# Patient Record
Sex: Male | Born: 1945 | Race: White | Hispanic: No | Marital: Single | Smoking: Former smoker
Health system: Southern US, Community
[De-identification: ages and names within clinical notes are randomized; demographics above are authoritative.]

## PROBLEM LIST (undated history)

## (undated) DIAGNOSIS — Z720 Tobacco use: Secondary | ICD-10-CM

## (undated) DIAGNOSIS — I509 Heart failure, unspecified: Secondary | ICD-10-CM

## (undated) DIAGNOSIS — M545 Low back pain, unspecified: Secondary | ICD-10-CM

## (undated) DIAGNOSIS — I472 Ventricular tachycardia, unspecified: Secondary | ICD-10-CM

## (undated) DIAGNOSIS — C801 Malignant (primary) neoplasm, unspecified: Secondary | ICD-10-CM

## (undated) DIAGNOSIS — I4892 Unspecified atrial flutter: Secondary | ICD-10-CM

## (undated) DIAGNOSIS — M069 Rheumatoid arthritis, unspecified: Secondary | ICD-10-CM

## (undated) DIAGNOSIS — R942 Abnormal results of pulmonary function studies: Secondary | ICD-10-CM

## (undated) DIAGNOSIS — G8929 Other chronic pain: Secondary | ICD-10-CM

## (undated) DIAGNOSIS — M5412 Radiculopathy, cervical region: Secondary | ICD-10-CM

## (undated) DIAGNOSIS — E785 Hyperlipidemia, unspecified: Secondary | ICD-10-CM

## (undated) DIAGNOSIS — I739 Peripheral vascular disease, unspecified: Secondary | ICD-10-CM

## (undated) DIAGNOSIS — N189 Chronic kidney disease, unspecified: Secondary | ICD-10-CM

## (undated) DIAGNOSIS — G43909 Migraine, unspecified, not intractable, without status migrainosus: Secondary | ICD-10-CM

## (undated) DIAGNOSIS — F419 Anxiety disorder, unspecified: Secondary | ICD-10-CM

## (undated) DIAGNOSIS — I10 Essential (primary) hypertension: Secondary | ICD-10-CM

## (undated) DIAGNOSIS — I219 Acute myocardial infarction, unspecified: Secondary | ICD-10-CM

## (undated) DIAGNOSIS — Z95810 Presence of automatic (implantable) cardiac defibrillator: Secondary | ICD-10-CM

## (undated) DIAGNOSIS — I255 Ischemic cardiomyopathy: Secondary | ICD-10-CM

## (undated) DIAGNOSIS — I2510 Atherosclerotic heart disease of native coronary artery without angina pectoris: Secondary | ICD-10-CM

## (undated) HISTORY — DX: Atherosclerotic heart disease of native coronary artery without angina pectoris: I25.10

## (undated) HISTORY — DX: Abnormal results of pulmonary function studies: R94.2

## (undated) HISTORY — DX: Ischemic cardiomyopathy: I25.5

## (undated) HISTORY — DX: Unspecified atrial flutter: I48.92

## (undated) HISTORY — DX: Malignant (primary) neoplasm, unspecified: C80.1

## (undated) HISTORY — PX: CATARACT EXTRACTION W/ INTRAOCULAR LENS  IMPLANT, BILATERAL: SHX1307

## (undated) HISTORY — DX: Tobacco use: Z72.0

## (undated) HISTORY — DX: Essential (primary) hypertension: I10

## (undated) HISTORY — DX: Peripheral vascular disease, unspecified: I73.9

## (undated) HISTORY — PX: TONSILLECTOMY: SUR1361

## (undated) HISTORY — DX: Hyperlipidemia, unspecified: E78.5

## (undated) HISTORY — DX: Anxiety disorder, unspecified: F41.9

## (undated) HISTORY — DX: Ventricular tachycardia: I47.2

## (undated) HISTORY — DX: Acute myocardial infarction, unspecified: I21.9

## (undated) HISTORY — DX: Heart failure, unspecified: I50.9

## (undated) HISTORY — DX: Ventricular tachycardia, unspecified: I47.20

## (undated) HISTORY — PX: VASECTOMY: SHX75

## (undated) HISTORY — DX: Chronic kidney disease, unspecified: N18.9

## (undated) HISTORY — DX: Radiculopathy, cervical region: M54.12

---

## 1991-05-07 DIAGNOSIS — I219 Acute myocardial infarction, unspecified: Secondary | ICD-10-CM

## 1991-05-07 HISTORY — DX: Acute myocardial infarction, unspecified: I21.9

## 2007-07-05 HISTORY — PX: FEMORAL ARTERY - FEMORAL ARTERY BYPASS GRAFT: SUR179

## 2007-12-08 ENCOUNTER — Ambulatory Visit: Payer: Self-pay | Admitting: Vascular Surgery

## 2007-12-10 ENCOUNTER — Encounter: Admission: RE | Admit: 2007-12-10 | Discharge: 2007-12-10 | Payer: Self-pay | Admitting: Vascular Surgery

## 2007-12-22 ENCOUNTER — Ambulatory Visit: Payer: Self-pay | Admitting: Vascular Surgery

## 2007-12-30 ENCOUNTER — Inpatient Hospital Stay (HOSPITAL_COMMUNITY): Admission: RE | Admit: 2007-12-30 | Discharge: 2008-01-01 | Payer: Self-pay | Admitting: Vascular Surgery

## 2007-12-30 ENCOUNTER — Ambulatory Visit: Payer: Self-pay | Admitting: Vascular Surgery

## 2007-12-30 ENCOUNTER — Encounter: Payer: Self-pay | Admitting: Vascular Surgery

## 2008-01-19 ENCOUNTER — Ambulatory Visit: Payer: Self-pay | Admitting: Vascular Surgery

## 2008-05-06 DIAGNOSIS — Z95810 Presence of automatic (implantable) cardiac defibrillator: Secondary | ICD-10-CM

## 2008-05-06 HISTORY — DX: Presence of automatic (implantable) cardiac defibrillator: Z95.810

## 2008-06-21 ENCOUNTER — Ambulatory Visit: Payer: Self-pay | Admitting: Vascular Surgery

## 2008-09-03 HISTORY — PX: VENTRICULAR ABLATION SURGERY: SHX835

## 2008-09-20 ENCOUNTER — Inpatient Hospital Stay (HOSPITAL_COMMUNITY): Admission: AD | Admit: 2008-09-20 | Discharge: 2008-09-23 | Payer: Self-pay | Admitting: Cardiology

## 2008-09-20 ENCOUNTER — Encounter: Payer: Self-pay | Admitting: Cardiology

## 2008-09-20 ENCOUNTER — Ambulatory Visit: Payer: Self-pay | Admitting: Cardiovascular Disease

## 2008-09-22 HISTORY — PX: EP IMPLANTABLE DEVICE: SHX172B

## 2008-09-22 HISTORY — PX: CARDIAC DEFIBRILLATOR PLACEMENT: SHX171

## 2008-09-23 ENCOUNTER — Encounter: Payer: Self-pay | Admitting: Internal Medicine

## 2008-09-28 ENCOUNTER — Encounter: Payer: Self-pay | Admitting: Internal Medicine

## 2008-10-08 DIAGNOSIS — I10 Essential (primary) hypertension: Secondary | ICD-10-CM | POA: Insufficient documentation

## 2008-10-08 DIAGNOSIS — I739 Peripheral vascular disease, unspecified: Secondary | ICD-10-CM | POA: Insufficient documentation

## 2008-10-08 DIAGNOSIS — E785 Hyperlipidemia, unspecified: Secondary | ICD-10-CM | POA: Insufficient documentation

## 2008-10-11 ENCOUNTER — Ambulatory Visit: Payer: Self-pay | Admitting: Cardiology

## 2008-10-11 DIAGNOSIS — I472 Ventricular tachycardia, unspecified: Secondary | ICD-10-CM | POA: Insufficient documentation

## 2008-10-11 DIAGNOSIS — I2510 Atherosclerotic heart disease of native coronary artery without angina pectoris: Secondary | ICD-10-CM | POA: Insufficient documentation

## 2008-10-11 DIAGNOSIS — I255 Ischemic cardiomyopathy: Secondary | ICD-10-CM | POA: Insufficient documentation

## 2008-10-12 ENCOUNTER — Ambulatory Visit: Payer: Self-pay | Admitting: Internal Medicine

## 2008-10-12 ENCOUNTER — Telehealth: Payer: Self-pay | Admitting: Internal Medicine

## 2008-10-12 ENCOUNTER — Ambulatory Visit: Payer: Self-pay | Admitting: Cardiology

## 2008-10-12 ENCOUNTER — Emergency Department (HOSPITAL_COMMUNITY): Admission: EM | Admit: 2008-10-12 | Discharge: 2008-10-12 | Payer: Self-pay | Admitting: Emergency Medicine

## 2008-10-13 ENCOUNTER — Inpatient Hospital Stay (HOSPITAL_COMMUNITY): Admission: EM | Admit: 2008-10-13 | Discharge: 2008-10-18 | Payer: Self-pay | Admitting: Cardiology

## 2008-10-19 ENCOUNTER — Telehealth: Payer: Self-pay | Admitting: Cardiology

## 2008-11-01 ENCOUNTER — Telehealth: Payer: Self-pay | Admitting: Cardiology

## 2008-11-03 ENCOUNTER — Ambulatory Visit: Payer: Self-pay | Admitting: Internal Medicine

## 2008-11-04 ENCOUNTER — Telehealth (INDEPENDENT_AMBULATORY_CARE_PROVIDER_SITE_OTHER): Payer: Self-pay | Admitting: *Deleted

## 2008-12-30 ENCOUNTER — Ambulatory Visit: Payer: Self-pay | Admitting: Internal Medicine

## 2008-12-30 ENCOUNTER — Encounter: Payer: Self-pay | Admitting: Cardiology

## 2009-01-02 ENCOUNTER — Encounter: Payer: Self-pay | Admitting: Internal Medicine

## 2009-01-10 ENCOUNTER — Telehealth: Payer: Self-pay | Admitting: Cardiology

## 2009-01-10 ENCOUNTER — Ambulatory Visit: Payer: Self-pay | Admitting: Cardiology

## 2009-01-10 DIAGNOSIS — R42 Dizziness and giddiness: Secondary | ICD-10-CM | POA: Insufficient documentation

## 2009-01-24 ENCOUNTER — Telehealth: Payer: Self-pay | Admitting: Cardiology

## 2009-02-13 ENCOUNTER — Encounter: Payer: Self-pay | Admitting: Cardiology

## 2009-02-15 ENCOUNTER — Encounter: Payer: Self-pay | Admitting: Cardiology

## 2009-03-01 ENCOUNTER — Telehealth: Payer: Self-pay | Admitting: Cardiology

## 2009-03-10 ENCOUNTER — Ambulatory Visit: Payer: Self-pay | Admitting: Internal Medicine

## 2009-03-10 ENCOUNTER — Ambulatory Visit: Payer: Self-pay | Admitting: Cardiology

## 2009-03-10 DIAGNOSIS — E875 Hyperkalemia: Secondary | ICD-10-CM | POA: Insufficient documentation

## 2009-03-13 LAB — CONVERTED CEMR LAB
ALT: 23 units/L (ref 0–53)
AST: 23 units/L (ref 0–37)
Alkaline Phosphatase: 75 units/L (ref 39–117)
Bilirubin, Direct: 0 mg/dL (ref 0.0–0.3)
CO2: 31 meq/L (ref 19–32)
Calcium: 8.4 mg/dL (ref 8.4–10.5)
Creatinine, Ser: 1.4 mg/dL (ref 0.4–1.5)
GFR calc non Af Amer: 54.19 mL/min (ref >60)
Glucose, Bld: 106 mg/dL — ABNORMAL HIGH (ref 70–99)
HDL: 31.1 mg/dL — ABNORMAL LOW (ref >39.00)
Sodium: 146 meq/L — ABNORMAL HIGH (ref 135–145)
TSH: 2.61 microintl units/mL (ref 0.35–5.50)
Total Bilirubin: 0.5 mg/dL (ref 0.3–1.2)
Total CHOL/HDL Ratio: 4

## 2009-03-14 ENCOUNTER — Telehealth: Payer: Self-pay | Admitting: Cardiology

## 2009-04-17 ENCOUNTER — Ambulatory Visit: Payer: Self-pay | Admitting: Vascular Surgery

## 2009-04-20 ENCOUNTER — Ambulatory Visit: Payer: Self-pay | Admitting: Vascular Surgery

## 2009-05-17 ENCOUNTER — Telehealth: Payer: Self-pay | Admitting: Cardiology

## 2009-05-23 ENCOUNTER — Encounter: Payer: Self-pay | Admitting: Cardiology

## 2009-06-01 ENCOUNTER — Telehealth: Payer: Self-pay | Admitting: Internal Medicine

## 2009-06-14 ENCOUNTER — Encounter: Payer: Self-pay | Admitting: Cardiology

## 2009-06-28 ENCOUNTER — Ambulatory Visit: Payer: Self-pay | Admitting: Cardiology

## 2009-06-28 ENCOUNTER — Encounter: Payer: Self-pay | Admitting: Internal Medicine

## 2009-06-28 ENCOUNTER — Ambulatory Visit: Payer: Self-pay

## 2009-07-07 ENCOUNTER — Telehealth: Payer: Self-pay | Admitting: Cardiology

## 2009-07-12 ENCOUNTER — Encounter: Payer: Self-pay | Admitting: Cardiology

## 2009-07-26 ENCOUNTER — Telehealth: Payer: Self-pay | Admitting: Cardiology

## 2009-08-03 ENCOUNTER — Ambulatory Visit: Payer: Self-pay | Admitting: Vascular Surgery

## 2009-08-29 ENCOUNTER — Telehealth (INDEPENDENT_AMBULATORY_CARE_PROVIDER_SITE_OTHER): Payer: Self-pay | Admitting: *Deleted

## 2009-09-26 ENCOUNTER — Ambulatory Visit: Payer: Self-pay | Admitting: Internal Medicine

## 2009-09-26 ENCOUNTER — Telehealth: Payer: Self-pay | Admitting: Internal Medicine

## 2009-10-10 ENCOUNTER — Encounter: Payer: Self-pay | Admitting: Internal Medicine

## 2009-10-25 ENCOUNTER — Telehealth (INDEPENDENT_AMBULATORY_CARE_PROVIDER_SITE_OTHER): Payer: Self-pay | Admitting: *Deleted

## 2009-11-03 DIAGNOSIS — R942 Abnormal results of pulmonary function studies: Secondary | ICD-10-CM

## 2009-11-03 HISTORY — DX: Abnormal results of pulmonary function studies: R94.2

## 2009-11-10 ENCOUNTER — Encounter: Payer: Self-pay | Admitting: Cardiology

## 2009-11-14 ENCOUNTER — Ambulatory Visit: Payer: Self-pay | Admitting: Internal Medicine

## 2009-11-14 ENCOUNTER — Encounter: Payer: Self-pay | Admitting: Internal Medicine

## 2009-11-14 ENCOUNTER — Ambulatory Visit: Payer: Self-pay | Admitting: Cardiology

## 2009-11-14 DIAGNOSIS — R0602 Shortness of breath: Secondary | ICD-10-CM | POA: Insufficient documentation

## 2009-11-24 ENCOUNTER — Ambulatory Visit: Payer: Self-pay | Admitting: Cardiology

## 2009-11-24 ENCOUNTER — Ambulatory Visit: Payer: Self-pay | Admitting: Internal Medicine

## 2009-11-24 ENCOUNTER — Encounter: Payer: Self-pay | Admitting: Cardiology

## 2009-11-24 ENCOUNTER — Ambulatory Visit: Payer: Self-pay

## 2009-11-24 ENCOUNTER — Ambulatory Visit (HOSPITAL_COMMUNITY): Admission: RE | Admit: 2009-11-24 | Discharge: 2009-11-24 | Payer: Self-pay | Admitting: Cardiology

## 2009-11-27 ENCOUNTER — Telehealth: Payer: Self-pay | Admitting: Cardiology

## 2010-01-18 ENCOUNTER — Telehealth: Payer: Self-pay | Admitting: Cardiology

## 2010-02-20 ENCOUNTER — Telehealth: Payer: Self-pay | Admitting: Cardiology

## 2010-02-20 ENCOUNTER — Encounter: Payer: Self-pay | Admitting: Cardiology

## 2010-02-22 ENCOUNTER — Ambulatory Visit: Payer: Self-pay | Admitting: Internal Medicine

## 2010-02-22 ENCOUNTER — Encounter: Payer: Self-pay | Admitting: Cardiology

## 2010-03-09 ENCOUNTER — Encounter (INDEPENDENT_AMBULATORY_CARE_PROVIDER_SITE_OTHER): Payer: Self-pay | Admitting: *Deleted

## 2010-05-02 ENCOUNTER — Telehealth: Payer: Self-pay | Admitting: Cardiology

## 2010-05-02 ENCOUNTER — Encounter: Payer: Self-pay | Admitting: Cardiology

## 2010-05-08 ENCOUNTER — Encounter: Payer: Self-pay | Admitting: Cardiology

## 2010-05-22 ENCOUNTER — Telehealth: Payer: Self-pay | Admitting: Cardiology

## 2010-05-24 ENCOUNTER — Ambulatory Visit
Admission: RE | Admit: 2010-05-24 | Discharge: 2010-05-24 | Payer: Self-pay | Source: Home / Self Care | Attending: Internal Medicine | Admitting: Internal Medicine

## 2010-05-24 ENCOUNTER — Encounter: Payer: Self-pay | Admitting: Internal Medicine

## 2010-05-31 ENCOUNTER — Ambulatory Visit: Admit: 2010-05-31 | Payer: Self-pay | Admitting: Internal Medicine

## 2010-05-31 ENCOUNTER — Encounter: Payer: Self-pay | Admitting: Cardiology

## 2010-05-31 ENCOUNTER — Ambulatory Visit
Admission: RE | Admit: 2010-05-31 | Discharge: 2010-05-31 | Payer: Self-pay | Source: Home / Self Care | Attending: Cardiology | Admitting: Cardiology

## 2010-06-05 NOTE — Progress Notes (Signed)
Summary: B/P readings  Phone Note Outgoing Call   Call placed by: Katina Dung, RN, BSN,  July 26, 2009 8:12 AM Call placed to: Patient Summary of Call: follow-up B/P and lab  Follow-up for Phone Call        [facility] for pt to call me --I am trying to follow-up on B/P and BMP Katina Dung, RN, BSN  July 26, 2009 8:55 AM  pt states B/P averaging 130-140/80--  BMP done 07-10-09--labs and B/P reviewed by Dr Darvin Neighbours recommended no changes at present time--I discussed with pt

## 2010-06-05 NOTE — Progress Notes (Signed)
Summary: lab order  Phone Note Call from Patient Call back at Home Phone [phone]   Caller: Patient Reason for Call: Talk to Nurse Summary of Call: needs written order for lab work, please in cholesterol..... fax to [facility] @ [phone] ATTN: Dewayne Hatch Initial call taken by: Migdalia Dk,  May 17, 2009 4:08 PM  Follow-up for Phone Call        SPOKE WITH  PT INSTRUCTED THAT JUST HAD LAB WORK DONE 11/10  Follow-up by: Scherrie Bateman, LPN,  May 17, 2009 4:39 PM

## 2010-06-05 NOTE — Medication Information (Signed)
Summary: Order for Chest X-ray  Order for Chest X-ray   Imported By: Marylou Mccoy 02/20/2010 14:17:19  _____________________________________________________________________  External Attachment:    Type:   Image     Comment:   External Document

## 2010-06-05 NOTE — Cardiovascular Report (Signed)
Summary: Office Visit Remote   Office Visit Remote   Imported By: Roderic Ovens 03/13/2010 15:33:04  _____________________________________________________________________  External Attachment:    Type:   Image     Comment:   External Document

## 2010-06-05 NOTE — Assessment & Plan Note (Signed)
Summary: 6 month rov./sl   Primary Provider:  Charlott Rakes, MD  CC:  no complaints.  History of Present Illness: 65 yo with history of CAD, ischemic CMP, and VT s/p ICD placement returns for followup.  He is on amiodarone now and has had no recurrent VT.  He has been stable since last appointment.  He is now taking amiodarone 200 mg qam, 100 mg qpm.  Decreasing to 200 mg daily led to frequent bothersome palpitations and fluttering in his heart so amiodarone was increased back to 200 mg qam, 100 mg qpm.  Mr. Nathan Pruitt is having considerable anxiety regarding his ICD.   Every time he feels any discomfort, etc, in his epigastric area he is very scared that the ICD will discharge.  He is anxious in general.  No exertional dyspnea but he is not very active.  No chest pain.  BP is elevated to 164/84 today and has been running high at home.    Interrogation of device today showed 8% a-pacing.  Optivol recently had increased to the reference line but appears to be flattening out.  He reports eating salami and onion rolls frequently recently (sodium load).   Labs (11/10): K 4.9, creatinine 1.4, LDL 70, HDL 31, LFTs normal  Current Medications (verified): 1)  Aspirin Ec 325 Mg Tbec (Aspirin) .... Take One Tablet By Mouth Daily 2)  Vytorin 10-80 Mg Tabs (Ezetimibe-Simvastatin) .... Take One Tablet By Mouth Dailyat Bedtimer 3)  Diovan 80 Mg Tabs (Valsartan) .... One Tablet Twice A Day 4)  Carvedilol 6.25 Mg Tabs (Carvedilol) .... Take One Tablet By Mouth Twice A Day 5)  Leflunomide 20 Mg Tabs (Leflunomide) .[name].. 1 Tab By Mouth Once Daily 6)  Furosemide 20 Mg Tabs (Furosemide) .... Take One Tablet By Mouth Daily. 7)  Vitamin D3 2000 Unit Caps (Cholecalciferol) .[name].. 1 Tab By Mouth Once Daily 8)  Amiodarone Hcl 200 Mg Tabs (Amiodarone Hcl) .... Take One Tablet By Mouth Twice A Day 9)  Omeprazole 40 Mg Cpdr (Omeprazole) .... One Tablet Daily  Allergies (verified): No Known Drug Allergies  Past  History:  Past Medical History: 1. Coronary artery disease.       a.     The patient reports history of silent MI in 1993.  This was likely an inferior MI (see below).       b.     The patient reports history of 2-D echocardiogram in March        2009, showing an EF of 40%.       c.     The patient reports history of Myoview in [facility] in March 2009, which        was per his report normal.       d.     The patient presented to [facility] in 5/10 with VT and mildly elevated cardiac enzymes.         LHC (5/10):  Inferobasal dyskinesis with EF 35-40%.  There was chronic total occlusion of the         mid RCA with good collaterals.  Luminals LCA.  This did not appear to be an acute cause of the 5/10 event.  2. Hypertension.  3. Hyperlipidemia.  4. Remote tobacco abuse with 47-pack-year history, quitting in August       2009.  5. Peripheral arterial disease.       a.     Status post left-to-right fem-fem bypass performed in  [place] in March 2009.       b.     Status post redo left-to-right fem-fem bypass performed by        Dr. Edilia Bo here at [facility] in 2009.  6. Rheumatoid arthritis, on leflunomide.  7.  Ischemic cardiomyopathy:  EF 35-40% by LV-gram 5/10 with inferobasal dyskinesis.  Echo 5/10 showed      EF 40% with mild LVH, no significant MR, inferobasal and posterobasal akinesis.  8.  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      He has a dual chamber Medtronic ICD.  9.  Atrial flutter:  Status post isthmus ablation 5/10.   10.  Restrictive lung disease: PFTs (1/11) with FVC 34%, FEV1 35%, ratio 102%, DLCO 52%.  Seen by Dr. Blenda Nicely in [place] (pulmonology) 11.  Anxiety  Family History: Reviewed history from 10/08/2008 and no changes required. Mother died in her late 42s with gastric cancer.  Father died in his late 36s with throat cancer and PVD.   He had a brother who died at 27 of an MI.   Social  History: Reviewed history from 10/11/2008 and no changes required. 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 -47ppy hx, quit 2009.  Alcohol Use - yes-minimal Regular Exercise - yes Drug Use - no (prior)  Review of Systems       All systems reviewed and negative except as per HPI.   Vital Signs:  Patient profile:   65 year old male Height:      70 inches Weight:      217 pounds BMI:     31.25 Pulse rate:   53 / minute BP sitting:   164 / 84  (left arm) Cuff size:   regular  Vitals Entered By: Hardin Negus, RMA (June 28, 2009 10:10 AM)  Physical Exam  General:  Well developed, well nourished, in no acute distress. Neck:  Neck supple, no JVD. No masses, thyromegaly or abnormal cervical nodes. Lungs:  Clear bilaterally to auscultation and percussion. Heart:  Non-displaced PMI, chest non-tender; regular rate and rhythm, S1, S2 without murmurs, rubs or gallops. Carotid upstroke normal, no bruit. Normal abdominal aortic size, no bruits. Femorals normal pulses, no bruits. 1+ PT pulse on right, trace PT pulse on left. No edema, no varicosities. Abdomen:  Bowel sounds positive; abdomen soft and non-tender without masses, organomegaly, or hernias noted. No hepatosplenomegaly. Extremities:  No clubbing or cyanosis. Neurologic:  Alert and oriented x 3. Psych:  anxious.      ICD Specifications Following MD:  Hillis Range, MD     ICD Vendor:  Medtronic     ICD Model Number:  [id number]     ICD Serial Number:  [id number] ICD DOI:  09/22/2008     ICD Implanting MD:  Hillis Range, MD  Lead 1:    Location: RA     DOI: 09/22/2008     Model #: [id number]     Serial #: [id number]     Status: active Lead 2:    Location: RV     DOI: 09/22/2008     Model #: [id number]     Serial #: [id number]     Status: active  ICD Follow Up ICD Dependent:  No      Episodes Coumadin:  No  Brady Parameters Mode MVP     Lower Rate Limit:  50     Upper Rate Limit 120  PAV  180     Sensed AV Delay:  150  Tachy Zones VF:  214     VT:  128     Impression & Recommendations:  Problem # 1:  CAD, NATIVE VESSEL (ICD-414.01) Stable, LDL at goal.  Continue ASA, Coreg, ARB, Vytorin.   Problem # 2:  VENTRICULAR TACHYCARDIA (ICD-427.1) No further VT.  Has Medtronic dual chamber ICD.  Have tried to decrease amiodarone to 200 mg daily but patient had very bothersome heart fluttering so he increased the dose back to 200 qam, 100 qpm with resolution of fluttering.  I would eventually like to get his amiodarone dose down to 200 mg daily.  PFTs on amiodarone showed a restrictive defect so I had him see a pulmonologist in [place] who compared the PFTs to a prior (pre-amiodarone) study and did not think that there was very significant difference between the studies. LFTs normal in 11/10. Needs repeat PFTs, LFTs, and TSH in 7/11 (followup appointment as well).   Problem # 3:  CARDIOMYOPATHY, ISCHEMIC (ICD-414.8) Stable systolic CHF.  Optivol is flattening out at the reference range line.  It is likely elevated due to recent dietary indiscretion.  As the Optivol signal is plateauing and will likely begin to decrease, I will not increase his Lasix.  Given HTN, I am going to increase valsartan to 80 mg by mouth two times a day (he is currently taking 40 mg bid).   Needs BMET in 10 days with increased valsartan.   Continue same dose of Coreg.   Problem # 4:  UNSPECIFIED PERIPHERAL VASCULAR DISEASE (ICD-443.9) Will followup with Dr. Edilia Bo.    Problem # 5:  HYPERTENSION, BENIGN ESSENTIAL (ICD-401.1) Increasing valsartan.  Will need to follow K closely with BMET in 10 days.    Other Orders: EKG w/ Interpretation (93000) Pulmonary Function Test (PFT)  Patient Instructions: 1)  Your physician has recommended you make the following change in your medication:   2)  Increase Diovan to 80mg  twice a day 3)  Lab in 10  days  at [facility] have the order 4)  Stop Protonix 5)   Start Omeprazole 40mg  daily 6)  Take and record your blood pressure --I will call you in 2 weeks and get the readings 7)  Your physician wants you to follow-up in: July 2011 with Dr Shirlee Latch.  You will receive a reminder letter in the mail two months in advance. If you don't receive a letter, please call our office to schedule the follow-up appointment.    8)  Your physician has recommended that you have a pulmonary function test.  Pulmonary Function Tests are a group of tests that measure how well air moves in and out of your lungs. JULY 2011 at the same time you see Dr Shirlee Latch Prescriptions: OMEPRAZOLE 40 MG CPDR (OMEPRAZOLE) one tablet daily  #90 x 3   Entered by:   Katina Dung, RN, BSN   Authorized by:   Marca Ancona, MD   Signed by:   Katina Dung, RN, BSN on 06/28/2009   Method used:   [facility]  [address] [id number]ail[facility]       [address]       [place], [place]  [address]       Ph: [phone] or [phone]       Fax: [phone]   RxID:   [id number] FUROSEMIDE 20 MG TABS (FUROSEMIDE) Take one tablet by mouth daily.  #  90 x 3   Entered by:   Katina Dung, RN, BSN   Authorized by:   Marca Ancona, MD   Signed by:   Katina Dung, RN, BSN on 06/28/2009   Method used:   [facility]ronically [facility]        [facility]  [address] [id number] ([facility]       [address]       [place], [place]  [address]       Ph: [phone] or [phone]       Fax: [phone]   RxID:   [id number] DIOVAN 80 MG TABS (VALSARTAN) one tablet twice a day  #180 x 3   Entered by:   Katina Dung, RN, BSN   Authorized by:   Marca Ancona, MD   Signed by:   Katina Dung, RN, BSN on 06/28/2009   Method used:   [facility]ronically [facility]        [facility]  [address] [id number] ([facility]       [address]       [place], [place]  [address]       Ph: [phone] or [phone]       Fax: [phone]   RxID:   [id number] VYTORIN 10-80 MG TABS (EZETIMIBE-SIMVASTATIN) Take one tablet by mouth dailyat bedtimer  #90  x 3   Entered by:   Katina Dung, RN, BSN   Authorized by:   Marca Ancona, MD   Signed by:   Katina Dung, RN, BSN on 06/28/2009   Method used:   [facility]ronically [facility]        [facility]  [address] [id number] ([facility]       [address]       [place], [place]  [address]       Ph: [phone] or [phone]       Fax: [phone]   RxID:   [id number] FUROSEMIDE 20 MG TABS (FUROSEMIDE) Take one tablet by mouth daily.  #30 x 6   Entered by:   Katina Dung, RN, BSN   Authorized by:   Marca Ancona, MD   Signed by:   Katina Dung, RN, BSN on 06/28/2009   Method used:   [facility]        [facility]  [address] [id number]       [address]       [place], [place]  [address]       Ph: [phone] or [phone]       Fax: [phone]   RxID:   [id number] VYTORIN 10-80 MG TABS (EZETIMIBE-SIMVASTATIN) Take one tablet by mouth dailyat bedtimer  #30 x 6   Entered by:   Katina Dung, RN, BSN   Authorized by:   Marca Ancona, MD   Signed by:   Katina Dung, RN, BSN on 06/28/2009   Method used:   [facility]ically [facility]        [facility]  [address] [id number]       [address]       [place], [place]  [address]       Ph: [phone] or [phone]       Fax: [phone]   RxID:   [id number] OMEPRAZOLE 40 MG CPDR (OMEPRAZOLE) one tablet daily  #30 x 6   Entered by:   Katina Dung, RN, BSN   Authorized by:   Marca Ancona, MD   Signed by:   Katina Dung, RN, BSN on 06/28/2009   Method used:   [facility]  [facility]  [address] [id number] ([facility])       [address]       [place], [place]  [address]       Ph: [phone] or [phone]       Fax: [phone]   RxID:   [id number] DIOVAN 80 MG TABS (VALSARTAN) one tablet twice a day  #60 x 6   Entered by:   Katina Dung, RN, BSN   Authorized by:   Marca Ancona, MD   Signed by:   Katina Dung, RN, BSN on 06/28/2009   Method used:   [facility]        [facility]  [address] [id number]       [address]        [place], [place]  [address]       Ph: [phone] or [phone]       Fax: [phone]   RxID:   [id number]

## 2010-06-05 NOTE — Letter (Signed)
Summary: Remote Device Check  [facility], Main Office  [address]   [place], [place] [address]   Phone: [phone]  Fax: [phone]     October 10, 2009 MRN: [id number]   [facility] [address] [place], [place]  [address]   Dear Mr. Ard,   Your remote transmission was recieved and reviewed by your physician.  All diagnostics were within normal limits for you.   __X____Your next office visit is scheduled for:  11-24-09 @ 910.    Sincerely,  Vella Kohler

## 2010-06-05 NOTE — Procedures (Signed)
Summary: [facility]   Imported By: Marylou Mccoy 07/17/2009 16:31:15  _____________________________________________________________________  External Attachment:    Type:   Image     Comment:   External Document

## 2010-06-05 NOTE — Procedures (Signed)
Summary: Spirometry Test  Spirometry Test   Imported By: Marylou Mccoy 06/01/2009 14:23:53  _____________________________________________________________________  External Attachment:    Type:   Image     Comment:   External Document  Appended Document: Spirometry Test Restriction on spirometry.  Would have him followup with pulmonary here to see if they think he is having lung toxicity from amiodarone.   Appended Document: Spirometry Test pt would like to see pulmonary in [place]--I will review with Dr Shirlee Latch and follow-up with patient--  Appended Document: Spirometry Test--Dr Talmage Coin Dr Winfred Burn in [place]   [phone]-- fax [phone] --- pt called Dr Lytle [place] office and made an appt  06-14-09 at 1:30 Dr Fanny Dance review with Dr Shirlee Latch  Appended Document: Spirometry Test talked with pt and Dr Rosana Fret fax records to Dr Winfred Burn   Clinical Lists Changes

## 2010-06-05 NOTE — Progress Notes (Signed)
Summary: device transmit  Phone Note Call from Patient Call back at Home Phone [phone]   Caller: Patient Reason for Call: Talk to Nurse Summary of Call: has some questions about his device transmit, does he get his device check periodically   Initial call taken by: Migdalia Dk,  Sep 26, 2009 8:46 AM  Follow-up for Phone Call        Spoke w/pt and aware of carelink transmissions.  Pt aware of next transmission and will send letter once check is reviewed. Vella Kohler  Sep 28, 2009 8:08 AM

## 2010-06-05 NOTE — Progress Notes (Signed)
Summary: dry cough  Phone Note Outgoing Call   Call placed by: Katina Dung, RN, BSN,  November 27, 2009 4:15 PM Call placed to: Patient Summary of Call: cough  Follow-up for Phone Call        I called pt with echo results--he mentioned he has been using Omeprazole regularly since OV with Dr Shirlee Latch 11/14/09--his cough does not seem to have changed any --he is asking If Dr Shirlee Latch has any other recommendations--I will forward to Dr Shirlee Latch for review     Appended Document: dry cough Not sure what is causing the cough.  I can have him see pulmonology if it becomes really bothersome.  He should get a chest X-ray if he has not had one recently.   Appended Document: dry cough LMTCB   Appended Document: dry cough discussed with pt by telephone--he declined pulmonary consult at present -he will contact his PCP,Dr Charlott Rakes and make arrangements through him for chest xray

## 2010-06-05 NOTE — Progress Notes (Signed)
Summary: refill request  Phone Note Refill Request Message from:  Patient on January 18, 2010 10:26 AM  pt wants a refill of protonic wants 90 day supply-doesn't want the omeprazole anymore it doesn't work as well-   Method Requested: Telephone to Pharmacy Initial call taken by: Glynda Jaeger,  January 18, 2010 10:27 AM  Follow-up for Phone Call        pt states Omeprazole doesn't help his cough but Protonix does --he is requesting to change from Omeprazole to Protonix--this is OK with Dr McLean--pt aware    New/Updated Medications: PROTONIX 40 MG TBEC (PANTOPRAZOLE SODIUM) one daily Prescriptions: PROTONIX 40 MG TBEC (PANTOPRAZOLE SODIUM) one daily  #90 x 3   Entered by:   Katina Dung, RN, BSN   Authorized by:   Marca Ancona, MD   Signed by:   Katina Dung, RN, BSN on 01/18/2010   Method used:   [facility]        [facility]  [address] [id number]       [address]       [place], [place]  [address]       Ph: [phone] or [phone]       Fax: [phone]   RxID:   [id number]    Current Medications (verified): 1)  Aspirin Ec 250mg  Tbec (Aspirin) .... Take One Tablet By Mouth Daily 2)  Vytorin 10-80 Mg Tabs (Ezetimibe-Simvastatin) .... Take One Tablet By Mouth Dailyat Bedtimer 3)  Diovan 80 Mg Tabs (Valsartan) .... One Tablet Twice A Day 4)  Carvedilol 6.25 Mg Tabs (Carvedilol) .... Take One Tablet By Mouth Twice A Day 5)  Leflunomide 20 Mg Tabs (Leflunomide) .[name].. 1 Tab By Mouth Once Daily 6)  Furosemide 20 Mg Tabs (Furosemide) .... Take One Tablet By Mouth Daily. 7)  Vitamin D3 2000 Unit Caps (Cholecalciferol) .[name].. 1 Tab By Mouth Once Daily 8)  Amiodarone Hcl 200 Mg Tabs (Amiodarone Hcl) .... Take One Tablet Once Daily 9)  Protonix 40 Mg Tbec (Pantoprazole Sodium) .... One Daily  Allergies: No Known Drug Allergies

## 2010-06-05 NOTE — Cardiovascular Report (Signed)
Summary: Office Visit   Office Visit   Imported By: Roderic Ovens 07/14/2009 11:05:32  _____________________________________________________________________  External Attachment:    Type:   Image     Comment:   External Document

## 2010-06-05 NOTE — Progress Notes (Signed)
Summary: Nathan Pruitt wants to increase dose  Phone Note Call from Patient Call back at Home Phone [phone]   Caller: Patient Reason for Call: Talk to Nurse, Talk to Doctor Summary of Call: Nathan Pruitt is on pantoprazole 40mg  and feels he needs a higher dose or to take it more times a day Initial call taken by: Omer Jack,  February 20, 2010 9:56 AM  Follow-up for Phone Call        Nathan Pruitt. has had increased coughing which wakes him at night. Cough is worse when lying down. He states cough is mostly dry but will sometimes have very small amout of phelgm. Two days ago he increased pantoprazole to 80 mg every AM and he thinks this may have helped slightly. Nathan Pruitt has not had chest X-ray done at primary MD's office. He wants to know if he should continue increased dose of pantoprazole or if there is something else he should try.  Will forward note to Dr. Shirlee Latch. Follow-up by: Dossie Arbour, RN, BSN,  February 20, 2010 12:17 PM     Appended Document: Nathan Pruitt wants to increase dose Should get CXR.  Increase protonix to 40 mg two times a day.   Appended Document: Nathan Pruitt wants to increase dose Called to give Nathan Pruitt Dr. Alford Highland recommendations. Nathan Pruitt states he told me earlier he increased dose of Pantoprazole to 80mg  daily. He now reports he actually increased it to 80 mg every AM and 40 mg every PM.  I instructed Nathan Pruitt Dr.McLean wants him to take Pantoprazole 40 mg two times a day. Nathan Pruitt states he will try this and call us if it doesn't work. [facility] I also told Nathan Pruitt Dr.McLean wants him to have Chest x-ray done. Nathan Pruitt would like to have done this week at [facility]. I called [facility] and they can do X-Ray there. Will fax order. [phone]. Phone- [phone] extension [phone]   Clinical Lists Changes  Medications: Changed medication from PROTONIX 40 MG TBEC (PANTOPRAZOLE SODIUM) one daily to PROTONIX 40 MG TBEC (PANTOPRAZOLE SODIUM) take one by mouth two times a day - Signed Rx of  PROTONIX 40 MG TBEC (PANTOPRAZOLE SODIUM) take one by mouth two times a day;  #180 x 3;  Signed;  Entered by: Dossie Arbour, RN, BSN;  Authorized by: Marca Ancona, MD;  Method used: [facility]  [address] [id number] [address] [place], [place]  [address], Ph: [phone] or [phone], Fax: [phone]    Prescriptions: PROTONIX 40 MG TBEC (PANTOPRAZOLE SODIUM) take one by mouth two times a day  #180 x 3   Entered by:   Dossie Arbour, RN, BSN   Authorized by:   Marca Ancona, MD   Signed by:   Dossie Arbour, RN, BSN on 02/20/2010   Method used:   [facility]        [facility]  [address] [id number]       [address]       [place], [place]  [address]       Ph: [phone] or [phone]       Fax: [phone]   RxID:   [id number]

## 2010-06-05 NOTE — Progress Notes (Signed)
  Phone Note Call from Patient   Caller: Patient Summary of Call: Patient called to remind [place] that ALL of his prescriptions are to be filled for a 90 day supply.  Pt said he has corrected this at his pharmacy and we need to update our records as well.  DONE..  NO follow up needed.   AT, CMA 08/29/2009 Initial call taken by: Judithe Modest CMA,  August 29, 2009 10:20 AM

## 2010-06-05 NOTE — Progress Notes (Signed)
Summary: question regarding medication   Phone Note Call from Patient Call back at Home Phone [phone] Message from:  Patient on June 01, 2009 9:41 AM  Refills Requested: Medication #1:  outh twice a day Caller: Patient Reason for Call: Talk to Nurse Details for Reason: pt has question regarding AMIODARONE HCL 200 MG TABS Take one tablet . Initial call taken by: Lorne Skeens,  June 01, 2009 9:42 AM  Follow-up for Phone Call        SPOKE WITH PT ATEMPTED TO DECREASE AMIODARONE 200 MG  1 DAILY  C/O  FLUTERING EPISODES 3-4 PER DAY INCREASED MED TO two times a day  AND EPISODES  SUBSIDED WHAT DO YOU RECOMMEND ? Follow-up by: Scherrie Bateman, LPN,  June 01, 2009 9:55 AM  Additional Follow-up for Phone Call Additional follow up Details #1::        I would like to minimize amiodarone dose.  Please advise pt to try 300mg  daily (1.5 200mg  tablets). Additional Follow-up by: Hillis Range, MD,  June 10, 2009 11:06 PM    Prescriptions: FUROSEMIDE 20 MG TABS (FUROSEMIDE) Take one tablet by mouth daily.  #90 x 3   Entered by:   Dennis Bast, RN, BSN   Authorized by:   Hillis Range, MD   Signed by:   Dennis Bast, RN, BSN on 06/12/2009   Method used:   Electronically to        [facility]  [address] [id number] (retail)       [address]       [place], [place]  [address]       Ph: [phone] or [phone]       Fax: [phone]   RxID:   [id number] AMIODARONE HCL 200 MG TABS (AMIODARONE HCL) Take one tablet by mouth twice a day  #180 x 3   Entered by:   Dennis Bast, RN, BSN   Authorized by:   Hillis Range, MD   Signed by:   Dennis Bast, RN, BSN on 06/12/2009   Method used:   Electronically to        [facility]  [address] [id number] ([facility])       [address]       [place], [place]  [address]       Ph: [phone] or [phone]       Fax: [phone]   RxID:   [id number]

## 2010-06-05 NOTE — Assessment & Plan Note (Signed)
Summary: icd check mdt   Visit Type:  Follow-up Referring Provider:  Marca Ancona, mD Primary Provider:  Charlott Rakes, MD   History of Present Illness: The patient presents today for routine electrophysiology followup. He reports doing very well since last being seen in our clinic. He denies ICD shocks or arrhythmias.  The patient denies symptoms of palpitations, chest pain, shortness of breath, orthopnea, PND, lower extremity edema, dizziness, presyncope, syncope, or neurologic sequela. The patient is tolerating medications without difficulties and is otherwise without complaint today.   Current Medications (verified): 1)  Aspirin Ec 250mg  Tbec (Aspirin) .... Take One Tablet By Mouth Daily 2)  Vytorin 10-80 Mg Tabs (Ezetimibe-Simvastatin) .... Take One Tablet By Mouth Dailyat Bedtimer 3)  Diovan 80 Mg Tabs (Valsartan) .... One Tablet Twice A Day 4)  Carvedilol 6.25 Mg Tabs (Carvedilol) .... Take One Tablet By Mouth Twice A Day 5)  Leflunomide 20 Mg Tabs (Leflunomide) .[name].. 1 Tab By Mouth Once Daily 6)  Furosemide 20 Mg Tabs (Furosemide) .... Take One Tablet By Mouth Daily. 7)  Vitamin D3 2000 Unit Caps (Cholecalciferol) .[name].. 1 Tab By Mouth Once Daily 8)  Amiodarone Hcl 200 Mg Tabs (Amiodarone Hcl) .... Take One Tablet Once Daily 9)  Omeprazole 40 Mg Cpdr (Omeprazole) .... One Tablet Daily  Allergies (verified): No Known Drug Allergies  Past History:  Past Medical History: Reviewed history from 11/14/2009 and no changes required. 1. Coronary artery disease.       a.     The patient reports history of silent MI in 1993.  This was likely an inferior MI (see below).       b.     The patient reports history of 2-D echocardiogram in March        2009, showing an EF of 40%.       c.     The patient reports history of Myoview in [facility] in March 2009, which        was per his report normal.       d.     The patient presented to [facility] in 5/10 with VT and mildly elevated cardiac  enzymes.         LHC (5/10):  Inferobasal dyskinesis with EF 35-40%.  There was chronic total occlusion of the         mid RCA with good collaterals.  Luminals LCA.  This did not appear to be an acute cause of the 5/10 event.  2. Hypertension.  3. Hyperlipidemia.  4. Remote tobacco abuse with 47-pack-year history, quitting in August 2009.  5. Peripheral arterial disease.       a.     Status post left-to-right fem-fem bypass performed in        [place] in March 2009.       b.     Status post redo left-to-right fem-fem bypass performed by        Dr. Edilia Bo here at [facility] in 2009.  6. Rheumatoid arthritis, on leflunomide.  7.  Ischemic cardiomyopathy:  EF 35-40% by LV-gram 5/10 with inferobasal dyskinesis.  Echo 5/10 showed      EF 40% with mild LVH, no significant MR, inferobasal and posterobasal akinesis.  8.  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      He has a dual chamber Medtronic ICD.  9.  Atrial flutter:  Status post isthmus ablation 5/10.   10.  PFTs (7/11):  FVC 74%, FEV1 80%, ratio 75%, TLC 78%, DLCO 68%.  This suggests a mild restrictive defect and a mild obstructive defect.  He did have response to bronchodilator. These PFTs were significantly better than the report from Dr. Blenda Nicely in [place] done prior.  11.  Anxiety  Past Surgical History: Reviewed history from 10/08/2008 and no changes required. Status post vasectomy.  Status post tonsillectomy.      Social History: Reviewed history from 10/11/2008 and no changes required. 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 -47ppy hx, quit 2009.  Alcohol Use - yes-minimal Regular Exercise - yes Drug Use - no (prior)  Review of Systems       All systems are reviewed and negative except as listed in the HPI.   Vital Signs:  Patient profile:   65 year old male Height:      70 inches Weight:      221 pounds BMI:      31.82 Pulse rate:   50 / minute BP sitting:   138 / 82  (left arm)  Vitals Entered By: Laurance Flatten CMA (November 24, 2009 8:54 AM)  Physical Exam  General:  Well developed, well nourished, in no acute distress. Head:  normocephalic and atraumatic Mouth:  Teeth, gums and palate normal. Oral mucosa normal. Neck:  Neck supple, no JVD. No masses, thyromegaly or abnormal cervical nodes. Chest Wall:  ICD pocket well healed Lungs:  Clear bilaterally to auscultation and percussion. Heart:  Non-displaced PMI, chest non-tender; regular rate and rhythm, S1, S2 without murmurs, rubs or gallops. Carotid upstroke normal, no bruit. Normal abdominal aortic size, no bruits. Femorals normal pulses, no bruits. Pedals normal pulses. No edema, no varicosities. Abdomen:  Bowel sounds positive; abdomen soft and non-tender without masses, organomegaly, or hernias noted. No hepatosplenomegaly. Msk:  Back normal, normal gait. Muscle strength and tone normal. Pulses:  pulses normal in all 4 extremities Extremities:  No clubbing or cyanosis. Neurologic:  Alert and oriented x 3. Skin:  Intact without lesions or rashes. Cervical Nodes:  no significant adenopathy Psych:  Normal affect.    ICD Specifications Following MD:  Hillis Range, MD     Referring MD:  [facility] ICD Vendor:  Medtronic     ICD Model Number:  [id number]     ICD Serial Number:  [id number] ICD DOI:  09/22/2008     ICD Implanting MD:  Hillis Range, MD  Lead 1:    Location: RA     DOI: 09/22/2008     Model #: 4098     Serial #: [id number]     Status: active Lead 2:    Location: RV     DOI: 09/22/2008     Model #: 6213     Serial #: [id number]     Status: active  ICD Follow Up Remote Check?  No Battery Voltage:  3.16 V     Charge Time:  8.2 seconds     ICD Dependent:  No       ICD Device Measurements Atrium:  Amplitude: 1.5 mV, Impedance: 380 ohms, Threshold: 0.5 V at 0.4 msec Right Ventricle:  Amplitude: 8.5 mV, Impedance: 418 ohms, Threshold: 1.5 V  at 0.4 msec Shock Impedance: 40/52 ohms   Episodes MS Episodes:  0     Percent Mode Switch:  0     Coumadin:  No Shock:  0     ATP:  0     Nonsustained:  0  Atrial Pacing:  2.7%     Ventricular Pacing:  0.2%  Brady Parameters Mode MVP     Lower Rate Limit:  50     Upper Rate Limit 120 PAV 180     Sensed AV Delay:  150  Tachy Zones VF:  214     VT:  128     Next Remote Date:  02/22/2010     Next Cardiology Appt Due:  11/04/2010 Tech Comments:  No parameter changes.  Device function normal.  Optivol normal, thoracic impedance down.  Carelink transmissions every 3 months .  ROV 1 year with Dr. Johney Frame. Altha Harm, LPN  November 24, 2009 9:08 AM  MD Comments:  agree  Impression & Recommendations:  Problem # 1:  VENTRICULAR TACHYCARDIA (ICD-427.1) doing well with amiodarone 200mg  daily and coreg. no changes today PFts reviewed pt reports LFts and TFTs normal per PCP  He should have LFTs and TFTs twice each year by PCP.  Problem # 2:  CARDIOMYOPATHY, ISCHEMIC (ICD-414.8) stable with chronic systolic dysfunction salt restriction is advised no changes today  Problem # 3:  CAD, NATIVE VESSEL (ICD-414.01) no symptoms of ischemia  Problem # 4:  HYPERTENSION, BENIGN ESSENTIAL (ICD-401.1) stable  Patient Instructions: 1)  return in 12 months 2)  carelink checks every 3 months 3)  follow-up with Dr Shirlee Latch as scheduled.

## 2010-06-05 NOTE — Cardiovascular Report (Signed)
Summary: Office Visit Remote   Office Visit Remote   Imported By: Roderic Ovens 10/19/2009 16:16:55  _____________________________________________________________________  External Attachment:    Type:   Image     Comment:   External Document

## 2010-06-05 NOTE — Consult Note (Signed)
Summary: [facility]  [facility]   Imported By: Marylou Mccoy 07/17/2009 16:11:39  _____________________________________________________________________  External Attachment:    Type:   Image     Comment:   External Document

## 2010-06-05 NOTE — Progress Notes (Signed)
Summary: B/P readings--will fax results 07-17-09  Phone Note Outgoing Call   Call placed by: Katina Dung, RN, BSN,  July 07, 2009 7:32 AM Call placed to: Patient Summary of Call: B/P readings  Follow-up for Phone Call        Valsartan increased to 80mg  two times a day 06-28-09--call and get B/P readings--check on BMP ?when did he have it done? discussed with patient--B/P readings with old B/P cuff was higher than he thought they should be so he got a new B/P cuff-- since he has new B/P cuff his readings are better but he has only had it a couple of days --he will continue to take and record his B/P -he will fax readings to me in about 10 days --he will get BMP in [place] next week

## 2010-06-05 NOTE — Letter (Signed)
Summary: Remote Device Check  [facility], Main Office  [address]   [place], [place] [address]   Phone: [phone]  Fax: [phone]     March 09, 2010 MRN: [id number]   [facility] [address] [place], [place]  [address]   Dear Mr. Bawa,   Your remote transmission was recieved and reviewed by your physician.  All diagnostics were within normal limits for you.  __X___Your next transmission is scheduled for: 05/24/2010.   Please transmit at any time this day.  If you have a wireless device your transmission will be sent automatically.  ______Your next office visit is scheduled for:                              . Please call our office to schedule an appointment.    Sincerely,  Altha Harm, LPN

## 2010-06-05 NOTE — Procedures (Signed)
Summary: appt with dr Shirlee Latch at 10:30 also   Allergies: No Known Drug Allergies    ICD Specifications Following MD:  Hillis Range, MD     Referring MD:  [facility] ICD Vendor:  Medtronic     ICD Model Number:  [id number]     ICD Serial Number:  [id number] ICD DOI:  09/22/2008     ICD Implanting MD:  Hillis Range, MD  Lead 1:    Location: RA     DOI: 09/22/2008     Model #: 4098     Serial #: [id number]     Status: active Lead 2:    Location: RV     DOI: 09/22/2008     Model #: 6213     Serial #: [id number]     Status: active  ICD Follow Up Remote Check?  No Battery Voltage:  3.18 V     Charge Time:  8.1 seconds     Underlying rhythm:  SB ICD Dependent:  No       ICD Device Measurements Atrium:  Amplitude: 1.3 mV, Impedance: 437 ohms, Threshold: 0.75 V at 0.4 msec Right Ventricle:  Amplitude: 11.0 mV, Impedance: 437 ohms, Threshold: 1.5 V at 0.4 msec Shock Impedance: 38/52 ohms   Episodes MS Episodes:  0     Coumadin:  No Shock:  0     ATP:  0     Nonsustained:  0     Atrial Pacing:  8.1%     Ventricular Pacing:  <0.1%  Brady Parameters Mode MVP     Lower Rate Limit:  50     Upper Rate Limit 120 PAV 180     Sensed AV Delay:  150  Tachy Zones VF:  214     VT:  128     Next Remote Date:  09/26/2009     Next Cardiology Appt Due:  12/04/2009 Tech Comments:  Normal device function.  VT suppressed on Amio and Coreg.  Pt is having problems with anxiety post ICD shocks.  He states that he feels worried about shocks with any problems (stomach upset, pains, etc).  Pt advised that this is not abnormal and he might benefit from some counseling.  Pt asked about anti-anxiety medications.  Advised this would probably be best coming from his PCP or counselor.  Pt aware and agrees with plan. Optivol mildy elevated.  Pt on Furosemide, no SOB or edema noted. RV output increased to 3V today to allow for safety margin.  No V pacing so shouldn't impact battery longevity. ROV 6 months JA. Gypsy Balsam RN BSN   June 28, 2009 10:05 AM

## 2010-06-05 NOTE — Letter (Signed)
Summary: [facility] - Chest 2 View  [facility] - [facility]   Imported By: Marylou Mccoy 03/14/2010 14:44:45  _____________________________________________________________________  External Attachment:    Type:   Image     Comment:   External Document

## 2010-06-05 NOTE — Procedures (Signed)
Summary: Cardiology Device Clinic   Allergies: No Known Drug Allergies   ICD Specifications Following MD:  Hillis Range, MD     Referring MD:  [facility] ICD Vendor:  Medtronic     ICD Model Number:  [id number]     ICD Serial Number:  [id number] ICD DOI:  09/22/2008     ICD Implanting MD:  Hillis Range, MD  Lead 1:    Location: RA     DOI: 09/22/2008     Model #: 4098     Serial #: [id number]     Status: active Lead 2:    Location: RV     DOI: 09/22/2008     Model #: 6213     Serial #: [id number]     Status: active  ICD Follow Up Remote Check?  No Battery Voltage:  3.16 V     Charge Time:  8.2 seconds     Underlying rhythm:  SR ICD Dependent:  No       ICD Device Measurements Atrium:  Amplitude: 2.1 mV, Impedance: 437 ohms,  Right Ventricle:  Amplitude: 13 mV, Impedance: 494 ohms,  Shock Impedance: 46/61 ohms   Episodes MS Episodes:  0     Percent Mode Switch:  0     Coumadin:  No Shock:  0     ATP:  0     Nonsustained:  0     Atrial Pacing:  10.9%     Ventricular Pacing:  0.2%  Brady Parameters Mode MVP     Lower Rate Limit:  50     Upper Rate Limit 120 PAV 180     Sensed AV Delay:  150  Tachy Zones VF:  214     VT:  128     Tech Comments:  Interrogation only for optivol check on Dr. Alford Highland schedule.  Optivol and thoracic impedance abnormal  5/26-6/1.   Altha Harm, LPN  November 14, 2009 12:01 PM

## 2010-06-05 NOTE — Assessment & Plan Note (Signed)
Summary: rov/pt having PFT @ 9AM [place] [facility]   Primary Provider:  Charlott Rakes, MD  CC:  after doing moderate work, feels dizzy, light headed, and and heavy breathing.  History of Present Illness: 65 yo with history of CAD, ischemic CMP, and VT s/p ICD placement returns for followup.  He is on amiodarone now and has had no recurrent VT.  He was able to decrease amiodarone to 200 mg daily without having bothersome palpitations.  Today his main complaint is poor stamina.  He has been doing some moderately heavy exertion around the house recently (cutting tree limbs, doing work under his house) leading to significant dyspnea.  He is also short of breath after walking 500-600 feet up a fairly steep hill to his mailbox.  In general, however, he leads a fairly sedentary life.  He also reports a dry cough that is rare but chronic (few times a day).  No chest pain.  PFTs done today showed mild restrictive defect and a mild obstructive defect with improvement after bronchodilator.    Optivol was assessed today.  The line has been flat recently, suggesting no significant volume overload.   Labs (11/10): K 4.9, creatinine 1.4, LDL 70, HDL 31, LFTs normal Labs (1/61): K 4.4, creatinine 1.3, LDL 61, HDL 35 Labs (7/11): LDL 62, HDL 23  Current Medications (verified): 1)  Aspirin Ec 250mg  Tbec (Aspirin) .... Take One Tablet By Mouth Daily 2)  Vytorin 10-80 Mg Tabs (Ezetimibe-Simvastatin) .... Take One Tablet By Mouth Dailyat Bedtimer 3)  Diovan 80 Mg Tabs (Valsartan) .... One Tablet Twice A Day 4)  Carvedilol 6.25 Mg Tabs (Carvedilol) .... Take One Tablet By Mouth Twice A Day 5)  Leflunomide 20 Mg Tabs (Leflunomide) .[name].. 1 Tab By Mouth Once Daily 6)  Furosemide 20 Mg Tabs (Furosemide) .... Take One Tablet By Mouth Daily. 7)  Vitamin D3 2000 Unit Caps (Cholecalciferol) .[name].. 1 Tab By Mouth Once Daily 8)  Amiodarone Hcl 200 Mg Tabs (Amiodarone Hcl) .... Take One Tablet Once Daily 9)  Omeprazole 40 Mg  Cpdr (Omeprazole) .... One Tablet Daily  Allergies (verified): No Known Drug Allergies  Past History:  Past Medical History: 1. Coronary artery disease.       a.     The patient reports history of silent MI in 1993.  This was likely an inferior MI (see below).       b.     The patient reports history of 2-D echocardiogram in March        2009, showing an EF of 40%.       c.     The patient reports history of Myoview in [facility] in March 2009, which        was per his report normal.       d.     The patient presented to [facility] in 5/10 with VT and mildly elevated cardiac enzymes.         LHC (5/10):  Inferobasal dyskinesis with EF 35-40%.  There was chronic total occlusion of the         mid RCA with good collaterals.  Luminals LCA.  This did not appear to be an acute cause of the 5/10 event.  2. Hypertension.  3. Hyperlipidemia.  4. Remote tobacco abuse with 47-pack-year history, quitting in August 2009.  5. Peripheral arterial disease.       a.     Status post left-to-right fem-fem bypass performed in  [place] in March 2009.       b.     Status post redo left-to-right fem-fem bypass performed by        Dr. Edilia Bo here at [facility] in 2009.  6. Rheumatoid arthritis, on leflunomide.  7.  Ischemic cardiomyopathy:  EF 35-40% by LV-gram 5/10 with inferobasal dyskinesis.  Echo 5/10 showed      EF 40% with mild LVH, no significant MR, inferobasal and posterobasal akinesis.  8.  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      He has a dual chamber Medtronic ICD.  9.  Atrial flutter:  Status post isthmus ablation 5/10.   10.  PFTs (7/11): FVC 74%, FEV1 80%, ratio 75%, TLC 78%, DLCO 68%.  This suggests a mild restrictive defect and a mild obstructive defect.  He did have response to bronchodilator. These PFTs were significantly better than the report from Dr. Blenda Nicely in [place] done prior.  11.  Anxiety  Family  History: Reviewed history from 10/08/2008 and no changes required. Mother died in her late 89s with gastric cancer.  Father died in his late 69s with throat cancer and PVD.   He had a brother who died at 74 of an MI.   Social History: Reviewed history from 10/11/2008 and no changes required. 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 -47ppy hx, quit 2009.  Alcohol Use - yes-minimal Regular Exercise - yes Drug Use - no (prior)  Review of Systems       All systems reviewed and negative except as per HPI.   Vital Signs:  Patient profile:   65 year old male Height:      70 inches Weight:      218 pounds BMI:     31.39 Pulse rate:   62 / minute Pulse rhythm:   regular BP sitting:   120 / 76  (right arm) Cuff size:   regular  Vitals Entered By: Judithe Modest CMA (November 14, 2009 10:58 AM)  Physical Exam  General:  Well developed, well nourished, in no acute distress. Neck:  Neck supple, no JVD. No masses, thyromegaly or abnormal cervical nodes. Lungs:  Mildly decreased breath sounds bilaterally.  Heart:  Non-displaced PMI, chest non-tender; regular rate and rhythm, S1, S2 without murmurs, rubs or gallops. Carotid upstroke normal, no bruit. Normal abdominal aortic size, no bruits. Femorals normal pulses, no bruits. 1+ PT pulse on right, trace PT pulse on left. No edema, no varicosities. Abdomen:  Bowel sounds positive; abdomen soft and non-tender without masses, organomegaly, or hernias noted. No hepatosplenomegaly. Extremities:  No clubbing or cyanosis. Neurologic:  Alert and oriented x 3. Psych:  Normal affect.    ICD Specifications Following MD:  Hillis Range, MD     Referring MD:  [facility] ICD Vendor:  Medtronic     ICD Model Number:  [id number]     ICD Serial Number:  [id number] ICD DOI:  09/22/2008     ICD Implanting MD:  Hillis Range, MD  Lead 1:    Location: RA     DOI: 09/22/2008     Model #: [id number]     Serial #: [id number]      Status: active Lead 2:    Location: RV     DOI: 09/22/2008     Model #: [id number]     Serial #: [id number]     Status: active  ICD Follow Up Remote  Check?  No ICD Dependent:  No      Episodes Coumadin:  No  Brady Parameters Mode MVP     Lower Rate Limit:  50     Upper Rate Limit 120 PAV 180     Sensed AV Delay:  150  Tachy Zones VF:  214     VT:  128     Impression & Recommendations:  Problem # 1:  CARDIOMYOPATHY, ISCHEMIC (ICD-414.8) EF 40-45% on last echo.  Patient reports dyspnea with moderate exertion.  He actually seems to be more active recently than in the past, so this increased activity level (yardwork, working under his house) may explain the apparently new exertional dyspnea.  The Optivol trend today was flat, which suggests that there is not significant volume overload.  He also appears euvolemic on exam.  I will get a repeat echo to make sure that LV function has not worsened.   Continue current doses of valsartan and Coreg.    Problem # 2:  VENTRICULAR TACHYCARDIA (ICD-427.1) No further VT.  Has Medtronic dual chamber ICD.  He is now down to 200 mg daily of amiodarone.  PFTs done today actually look better (only mild restriction) compared to PFTs from [place].  He will need LFTs and TSH done. He has had an eye exam this year and I have asked him to get one yearly while taking amiodarone.  Repeat PFTs in 6 months given prior abnormality.    Problem # 3:  CAD, NATIVE VESSEL (ICD-414.01) Stable, LDL at goal.  Continue ASA, Coreg, ARB, Vytorin.   Problem # 4:  DYSPNEA (ICD-786.05) Patient has noted dyspnea recently with moderate exertion.  He does not appear volume overloaded and the Optivol trend is flat, so I will not increase his Lasix.  At baseline, he has been quite inactive so I wonder if these symptoms are simply due to obesity and deconditioning.  He does have mild obstructive airways disease, likely COPD from prior smoking, and there was response to bronchodilators.  I  recommended a trial of Spiriva but he wants to hold off for now.    Problem # 5:  COUGH Patient has a nagging dry cough.  He is on an ARB rather than an ACEI.  The cough does seem to have begun after he stopped his PPI.  ? related to GERD.  I will have him retry omeprazole to see if it resolves the cough.   Other Orders: Pulmonary Function Test (PFT) Echocardiogram (Echo)  Patient Instructions: 1)  Your physician has recommended you make the following change in your medication:  2)  Start Omeprazole 40mg  daily 3)  Your physician has requested that you have an echocardiogram.  Echocardiography is a painless test that uses sound waves to create images of your heart. It provides your doctor with information about the size and shape of your heart and how well your heart's chambers and valves are working.  This procedure takes approximately one hour. There are no restrictions for this procedure. July 22 4)  Your physician has recommended that you have a pulmonary function test.  Pulmonary Function Tests are a group of tests that measure how well air moves in and out of your lungs. In 6 MONTHS 5)  Your physician wants you to follow-up in: 6 months with Dr Shirlee Latch. You will receive a reminder letter in the mail two months in advance. If you don't receive a letter, please call our office to schedule the follow-up appointment.

## 2010-06-05 NOTE — Procedures (Signed)
Summary: [facility] - Spirometry Test  [facility] - Spirometry Test   Imported By: Marylou Mccoy 05/31/2009 12:17:26  _____________________________________________________________________  External Attachment:    Type:   Image     Comment:   External Document

## 2010-06-05 NOTE — Progress Notes (Signed)
Summary: pt's difibulator gave him a little shock  Phone Note Call from Patient Call back at Home Phone [phone]   Caller: Patient [phone] Reason for Call: Talk to Nurse, Referral Summary of Call: pt said difibulator gave him the feeling like when he is here and having his device checked -will do a transmission and send it-pls call Initial call taken by: Glynda Jaeger,  October 25, 2009 8:25 AM  Follow-up for Phone Call        pt calling again, spoke with [facility]... stated they would look at tranmission and call back if anything is abnormal, pt request call back, Migdalia Dk  October 25, 2009 4:05 PM   Additional Follow-up for Phone Call Additional follow up Details #1::        Tried to reassure the patient that there were no episodes recorded even in the monitor zone which is set up @ 128bpm.  He is seeing Dr. Johney Frame 7/21and I instructed him to call if he has another episode and download again. Additional Follow-up by: Altha Harm, LPN,  October 25, 2009 5:04 PM

## 2010-06-05 NOTE — Miscellaneous (Signed)
Summary: Orders Update pft charges  Clinical Lists Changes  Orders: Added new Service order of Carbon Monoxide diffusing w/capacity (94720) - Signed Added new Service order of Lung Volumes (94240) - Signed Added new Service order of Spirometry (Pre & Post) (94060) - Signed 

## 2010-06-07 NOTE — Medication Information (Signed)
Summary: Lab Orders  Lab Orders   Imported By: Marylou Mccoy 05/21/2010 15:09:55  _____________________________________________________________________  External Attachment:    Type:   Image     Comment:   External Document

## 2010-06-07 NOTE — Assessment & Plan Note (Signed)
Summary: Nathan Pruitt Nathan Pruitt PT HAS ANOTHER MD APPT AT 9:40 MAY BE RUNNING LATE/SL   Visit Type:  Follow-up Referring Provider:  Marca Ancona, mD Primary Provider:  Charlott Rakes, MD  CC:  pt has no complaints today.  History of Present Illness: 65 yo with history of CAD, ischemic CMP, and VT s/p ICD placement returns for followup.  He is on amiodarone now and has had no recurrent VT.  Today his main complaint is poor stamina.  He gets short of breath after walking 500-600 feet up a fairly steep hill to his mailbox or when doing heavy exertion around the yard.  In general, however, he leads a fairly sedentary life.  Chronic cough has resolved.  No chest pain.  He has been congested with URI symptoms recently so is going to reschedule his PFTs.  Weight is actually down 8 lbs.  Repeat echo in 7/11 showed EF 50%, mild LVH, akinesis of the basal to mid inferoposterior wall.   ECG: NSR, 1st degree AV block, PVCs, old inferior MI  Labs (11/10): K 4.9, creatinine 1.4, LDL 70, HDL 31, LFTs normal Labs (0/45): K 4.4, creatinine 1.3, LDL 61, HDL 35 Labs (7/11): LDL 62, HDL 23 Labs (1/12): K 4.8, LFTs normal, creatinine 1.44, LDL 69, HDL 33  Current Medications (verified): 1)  Aspirin Ec 325 Mg Tbec (Aspirin) .... Take One Tablet By Mouth Daily 2)  Vytorin 10-80 Mg Tabs (Ezetimibe-Simvastatin) .... Take One Tablet By Mouth Dailyat Bedtimer 3)  Diovan 80 Mg Tabs (Valsartan) .... One Tablet Twice A Day 4)  Carvedilol 6.25 Mg Tabs (Carvedilol) .... Take One Tablet By Mouth Twice A Day 5)  Leflunomide 20 Mg Tabs (Leflunomide) .[name].. 1 Tab By Mouth Once Daily 6)  Furosemide 20 Mg Tabs (Furosemide) .... Take One Tablet By Mouth Daily. 7)  Vitamin D3 2000 Unit Caps (Cholecalciferol) .[name].. 1 Tab By Mouth Once Daily 8)  Amiodarone Hcl 200 Mg Tabs (Amiodarone Hcl) .... Take One Tablet Once Daily 9)  Protonix 40 Mg Tbec (Pantoprazole Sodium) .... Take One By Mouth Two Times A Day  Allergies (verified): No Known Drug  Allergies  Past History:  Past Medical History: 1. Coronary artery disease.       a.     The patient reports history of silent MI in 1993.  This was likely an inferior MI (see below).       b.     The patient reports history of 2-D echocardiogram in March        2009, showing an EF of 40%.       c.     The patient reports history of Myoview in [facility] in March 2009, which        was per his report normal.       d.     The patient presented to [facility] in 5/10 with VT and mildly elevated cardiac enzymes.         LHC (5/10):  Inferobasal dyskinesis with EF 35-40%.  There was chronic total occlusion of the         mid RCA with good collaterals.  Luminals LCA.  This did not appear to be an acute cause of the 5/10 event.  2. Hypertension.  3. Hyperlipidemia.  4. Remote tobacco abuse with 47-pack-year history, quitting in August 2009.  5. Peripheral arterial disease.       a.     Status post left-to-right fem-fem bypass performed in  [place] in March 2009.       b.     Status post redo left-to-right fem-fem bypass performed by        Dr. Edilia Bo here at [facility] in 2009.  6. Rheumatoid arthritis, on leflunomide.  7.  Ischemic cardiomyopathy:  EF 35-40% by LV-gram 5/10 with inferobasal dyskinesis.  Echo 5/10 showed EF 40% with mild LVH, no significant MR, inferobasal and posterobasal akinesis.  Echo (7/11): EF 50%, mild LVH, basal-mid inferoposterior akinesis.  8.  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  He has a dual chamber Medtronic ICD.  9.  Atrial flutter:  Status post isthmus ablation 5/10.   10.  PFTs (7/11): FVC 74%, FEV1 80%, ratio 75%, TLC 78%, DLCO 68%.  This suggests a mild restrictive defect and a mild obstructive defect.  He did have response to bronchodilator. These PFTs were significantly better than the report from Dr. Blenda Nicely in [place] done prior.  11.  Anxiety  Family History: Reviewed history from  10/08/2008 and no changes required. Mother died in her late 55s with gastric cancer.  Father died in his late 78s with throat cancer and PVD.   He had a brother who died at 78 of an MI.   Social History: Reviewed history from 10/11/2008 and no changes required. 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 -47ppy hx, quit 2009.  Alcohol Use - yes-minimal Regular Exercise - yes Drug Use - no (prior)  Review of Systems       All systems reviewed and negative except as per HPI.   Vital Signs:  Patient profile:   65 year old male Height:      70 inches Weight:      210.50 pounds Pulse rate:   54 / minute BP sitting:   96 / 62  (left arm)  Vitals Entered By: Celestia Khat, CMA (May 31, 2010 10:15 AM)  Physical Exam  General:  Well developed, well nourished, in no acute distress. Neck:  Neck supple, no JVD. No masses, thyromegaly or abnormal cervical nodes. Lungs:  Clear bilaterally to auscultation and percussion. Heart:  Non-displaced PMI, chest non-tender; regular rate and rhythm, S1, S2 without murmurs, rubs or gallops. Carotid upstroke normal, no bruit. Normal abdominal aortic size, no bruits. Femorals normal pulses, no bruits. Pedals normal pulses. No edema, no varicosities. Abdomen:  Bowel sounds positive; abdomen soft and non-tender without masses, organomegaly, or hernias noted. No hepatosplenomegaly. Extremities:  No clubbing or cyanosis. Neurologic:  Alert and oriented x 3. Psych:  Normal affect.    ICD Specifications Following MD:  Hillis Range, MD     Referring MD:  [facility] ICD Vendor:  Medtronic     ICD Model Number:  [id number]     ICD Serial Number:  [id number] ICD DOI:  09/22/2008     ICD Implanting MD:  Hillis Range, MD  Lead 1:    Location: RA     DOI: 09/22/2008     Model #: [id number]     Serial #: [id number]     Status: active Lead 2:    Location: RV     DOI: 09/22/2008     Model #: [id number]     Serial #: [id number]      Status: active  ICD Follow Up ICD Dependent:  No      Episodes Coumadin:  No  Brady Parameters Mode MVP     Lower  Rate Limit:  50     Upper Rate Limit 120 PAV 180     Sensed AV Delay:  150  Tachy Zones VF:  214     VT:  128     Impression & Recommendations:  Problem # 1:  CARDIOMYOPATHY, ISCHEMIC (ICD-414.8) Last echo in 7/11 with EF 50%.  He appears euvolemic and weight is down.  He does have dyspnea with moderate to heavy exertion which I think may be more due to deconditioning (very inactive).  Continue valsartan and Coreg at current doses.   Problem # 2:  VENTRICULAR TACHYCARDIA (ICD-427.1) Doing well on Coreg and amiodarone.  Has ICD.  LFTs normal recently on amiodarone.  Getting TSH and repeat PFTs.  He will get a yearly eye exam.   Problem # 3:  CAD, NATIVE VESSEL (ICD-414.01) No symptoms of ischemia.  Continue ASA, statin, Coreg, ACEI.  LDL at goal (< 70).   Patient Instructions: 1)  Your physician wants you to follow-up in:  6 months with Dr Harmon Dun 2012) You will receive a reminder letter in the mail two months in advance. If you don't receive a letter, please call our office to schedule the follow-up appointment. Prescriptions: AMIODARONE HCL 200 MG TABS (AMIODARONE HCL) Take one tablet once daily  #90 x 3   Entered by:   Katina Dung, RN, BSN   Authorized by:   Marca Ancona, MD   Signed by:   Katina Dung, RN, BSN on 05/31/2010   Method used:   Electronically to        [facility]  [address] [id number] (retail)       [address]       [place], [place]  [address]       Ph: [phone] or [phone]       Fax: [phone]   RxID:   [id number] FUROSEMIDE 20 MG TABS (FUROSEMIDE) Take one tablet by mouth daily.  #90 x 3   Entered by:   Katina Dung, RN, BSN   Authorized by:   Marca Ancona, MD   Signed by:   Katina Dung, RN, BSN on 05/31/2010   Method used:   Electronically to        [facility]  [address] [id number] (retail)       [address] [place] [address]       [place],  [place]  [address]       Ph: [phone] or [phone]       Fax: [phone]   RxID:   [id number] CARVEDILOL 6.25 MG TABS (CARVEDILOL) Take one tablet by mouth twice a day  #180 x 3   Entered by:   Katina Dung, RN, BSN   Authorized by:   Marca Ancona, MD   Signed by:   Katina Dung, RN, BSN on 05/31/2010   Method used:   Electronically to        [facility]  [id number] ([facility])       [address]       [place], [place]  [address]       Ph: [phone] or [phone]       Fax: [phone]   RxID:   [id number] DIOVAN 80 MG TABS (VALSARTAN) one tablet twice a day  #180 x 3   Entered by:   Katina Dung, RN, BSN   Authorized by:   Marca Ancona, MD   Signed by:   Katina Dung, RN, BSN on 05/31/2010   Method used:   [facility]ronically to  [facility]  [id number] ([facility])       [address]       [place], [place]  [address]       Ph: [phone] or [phone]       Fax: [phone]   RxID:   [id number] VYTORIN 10-80 MG TABS (EZETIMIBE-SIMVASTATIN) Take one tablet by mouth dailyat bedtimer  #90 x 3   Entered by:   Katina Dung, RN, BSN   Authorized by:   Marca Ancona, MD   Signed by:   Katina Dung, RN, BSN on 05/31/2010   Method used:   [facility]ronically to        [facility]  [id number] ([facility]       [address]       [place], [place]  [address]       Ph: [phone] or [phone]       Fax: [phone]   RxID:   [id number]

## 2010-06-07 NOTE — Progress Notes (Signed)
Summary: question on meds  Phone Note Call from Patient Call back at Home Phone [phone]   Caller: Patient Reason for Call: Talk to Nurse Summary of Call: pt needs to know which over the counter med ha can get for a chest cold. Initial call taken by: Roe Coombs,  May 22, 2010 9:31 AM  Follow-up for Phone Call        Patient having alot of chest congestion. Advised him to try using a humidifier, nasal spray, robitussin OTC to see if that helps. Since he is on Amiodarone and Coreg, advised him to stay away from anything with pseudoephedrine. He agrees and will contact his PCP if his symptoms persist. Ellender Hose RN  May 22, 2010 10:10 AM  Follow-up by: Whitney Maeola Sarah RN,  May 22, 2010 10:10 AM

## 2010-06-07 NOTE — Progress Notes (Signed)
Summary: Questions about having labs drawn  Phone Note Call from Patient Call back at Home Phone [phone]   Caller: Patient Summary of Call: Pt calling regarding  getting a order for blood work Initial call taken by: Judie Grieve,  May 02, 2010 10:41 AM  Follow-up for Phone Call        Pt. called because he would like to have an order for blood work prior the office visit with Dr. Shirlee Latch on 05/31/10. Pt. states he gets blood work every 8 weeks for his Arthritis at the [facility] on 05/08/10. And if Dr. Shirlee Latch would like to have blood work, to send an order,  so he won't have  blood drawn again when he comes in  to the office. Follow-up by: Ollen Gross, RN, BSN,  May 02, 2010 11:14 AM     Appended Document: Questions about having labs drawn He should get lipids and LFTs before seeing me, would also get BMET>   Appended Document: Questions about having labs drawn I called the pt and made him aware of Dr. Alford Highland recommendations. I explained we will fax an order to the [facility]. The phone # there is [phone]. I will contact them for a fax #. The pt states I should talk with Elita Quick.  [facility] fax # is [phone]. They state they will be glad to file the lab order for the pt.

## 2010-06-07 NOTE — Letter (Signed)
Summary: Generic Letter  [facility], Main Office  [address]   [place], [place] [address]   Phone: [phone]  Fax: [phone]        May 31, 2010 MRN: [id number]    [facility] [place] [address] [place], [place]  [address] DOB 1946-02-16   BMP/Lipid profile/Liver profile/TSH/CBC 414.01 STANDING ORDER ---EVERY 6 MONTHS Please fax results to [phone]           Nathan Pruitt    This letter has been electronically signed by your physician.

## 2010-06-13 ENCOUNTER — Encounter (INDEPENDENT_AMBULATORY_CARE_PROVIDER_SITE_OTHER): Payer: Self-pay | Admitting: *Deleted

## 2010-06-21 NOTE — Letter (Signed)
Summary: Remote Device Check  [employer], Main Office  [address]   [place], [place] [address]   Phone: [phone]  Fax: [phone]     June 13, 2010 MRN: [id number]   [facility] [place] [address] [place], [place]  [address]   Dear Nathan Pruitt,   Your remote transmission was recieved and reviewed by your physician.  All diagnostics were within normal limits for you.  __X___Your next transmission is scheduled for:  08-30-2010.  Please transmit at any time this day.  If you have a wireless device your transmission will be sent automatically.   Sincerely,  Vella Kohler

## 2010-06-21 NOTE — Cardiovascular Report (Signed)
Summary: Office Visit Remote   Office Visit Remote   Imported By: Roderic Ovens 06/14/2010 15:32:49  _____________________________________________________________________  External Attachment:    Type:   Image     Comment:   External Document

## 2010-06-26 ENCOUNTER — Encounter (INDEPENDENT_AMBULATORY_CARE_PROVIDER_SITE_OTHER): Payer: Medicare Other

## 2010-06-26 ENCOUNTER — Encounter: Payer: Self-pay | Admitting: Internal Medicine

## 2010-06-26 DIAGNOSIS — R0602 Shortness of breath: Secondary | ICD-10-CM

## 2010-06-27 ENCOUNTER — Encounter: Payer: Self-pay | Admitting: Cardiology

## 2010-06-28 ENCOUNTER — Telehealth: Payer: Self-pay | Admitting: Cardiology

## 2010-07-03 NOTE — Progress Notes (Signed)
Summary: sugery on 4/16 for colonscopy is this o.k with dm  Phone Note From Other Clinic   Caller: [facility] [phone] Request: Talk with Nurse Summary of Call: pt having surgery on 4/16 for colonscopy. is this o.k. with dr. Shirlee Latch.  Initial call taken by: Lorne Skeens,  June 28, 2010 9:24 AM     Appended Document: sugery on 4/16 for colonscopy is this o.k with dm ok to get colonoscopy. Mildly higher risk for moderate sedation given cardiac history but not prohibitive.   Appended Document: sugery on 4/16 for colonscopy is this o.k with dm discussed with pt, faxed to Dr Edman Circle in [place]

## 2010-07-12 NOTE — Assessment & Plan Note (Signed)
Summary: PFT ///kp   Allergies: No Known Drug Allergies   Other Orders: Carbon Monoxide diffusing w/capacity (16109) Lung Volumes/Gas dilution or washout (60454) Spirometry (Pre & Post) [phone])

## 2010-07-18 ENCOUNTER — Ambulatory Visit (INDEPENDENT_AMBULATORY_CARE_PROVIDER_SITE_OTHER): Payer: Medicare Other | Admitting: Vascular Surgery

## 2010-07-18 ENCOUNTER — Encounter (INDEPENDENT_AMBULATORY_CARE_PROVIDER_SITE_OTHER): Payer: Medicare Other

## 2010-07-18 DIAGNOSIS — I70219 Atherosclerosis of native arteries of extremities with intermittent claudication, unspecified extremity: Secondary | ICD-10-CM

## 2010-07-18 DIAGNOSIS — Z48812 Encounter for surgical aftercare following surgery on the circulatory system: Secondary | ICD-10-CM

## 2010-07-19 NOTE — Assessment & Plan Note (Signed)
OFFICE VISIT  Nathan Pruitt, Nathan Pruitt DOB:  [birth year]                                       07/18/2010 CHART#:[id number]  I saw patient in the office today for continued follow-up of his peripheral vascular disease.  This is a pleasant 65 year old gentleman who I performed a redo fem-fem bypass graft in August 2009.  He had a previous bypass elsewhere, which was occluded.  He comes in for a yearly follow-up visit.  He admits that he is not very active.  I do not get any history of claudication, rest pain, or nonhealing ulcers.  PAST MEDICAL HISTORY:  Significant for a history of atrial fibrillation and he has had a previous AICD.  He also has hypertension and hypercholesterolemia.  SOCIAL HISTORY:  He is single.  He quit tobacco in August 2009.  REVIEW OF SYSTEMS:  GENERAL:  He has had no recent chest pain, chest pressure, palpitations or arrhythmias.  He does admit to dyspnea on exertion.  He has had no history of stroke or TIAs. PULMONARY:  He has had no productive cough, bronchitis, asthma, or wheezing.  PHYSICAL EXAMINATION:  This is a pleasant 65 year old gentleman who appears his stated age.  Blood pressure is 109/77, heart rate is 52, saturation 98%.  Lungs are clear bilaterally to auscultation without rales, rhonchi, or wheezing.  On cardiovascular exam, I do not detect any carotid bruits.  He has a regular rate and rhythm.  He has palpable femoral, popliteal, and posterior tibial pulses bilaterally.  Abdomen is soft and nontender.  Musculoskeletal:  He has no major deformities or cyanosis.  Neurologic:  He has no focal weakness or paresthesias.  I have independently interpreted his arterial Doppler study today which shows ABIs of 100% bilaterally.  He has biphasic waveforms throughout his fem-fem bypass graft, which is patent.  He has some elevated velocities in the common femoral artery on the left, which is the donor side.  He had a vein patch  angioplasty on that side.  Overall I am pleased with his progress.  I have ordered a follow-up duplex scan in 1 year.  I will see him back in 2 years unless there has been any significant change in his duplex scan.  He knows to call sooner if he has problems.  I have encouraged him to try to be as active as possible, but he admits that this is not a high priority for him.    [name]. Edilia Bo, M.D. Electronically Signed  CSD/MEDQ  D:  07/18/2010  T:  07/19/2010  Job:  [id number]  cc:   Dr. Charlott Rakes

## 2010-07-26 NOTE — Procedures (Unsigned)
BYPASS GRAFT EVALUATION  INDICATION:  Left to right fem-fem bypass graft.  HISTORY: Diabetes:  No. Cardiac:  Yes. Hypertension:  Yes. Smoking:  Previous. Previous Surgery:  Redo of left to right fem-fem bypass graft on 12/30/2007.  SINGLE LEVEL ARTERIAL EXAM                              RIGHT              LEFT Brachial:                    127                125 Anterior tibial:             142                130 Posterior tibial:            140                132 Peroneal: Ankle/brachial index:        1.12               1.04  PREVIOUS ABI:  Date:  08/03/2009  RIGHT:  0.94  LEFT:  0.92  LOWER EXTREMITY BYPASS GRAFT DUPLEX EXAM:  DUPLEX:  Biphasic Doppler waveforms noted throughout the bypass graft with a velocity of 255 cm/s noted within the left common femoral artery.  IMPRESSION: 1. Patent left to right femoral-femoral bypass graft with increased     velocity noted in the left common femoral artery. 2. Bilateral ankle brachial indices and biphasic Doppler waveforms     suggest normal perfusion of the bilateral lower extremities which     appeared generally stable when compared to the previous exam. 3. No significant change noted when compared to the previous exam on     08/03/2009.  ___________________________________________ [name]. Edilia Bo, M.D.  CH/MEDQ  D:  07/20/2010  T:  07/20/2010  Job:  [id number]

## 2010-08-13 LAB — BASIC METABOLIC PANEL
BUN: 15 mg/dL (ref 6–23)
BUN: 15 mg/dL (ref 6–23)
BUN: 16 mg/dL (ref 6–23)
BUN: 17 mg/dL (ref 6–23)
CO2: 27 mEq/L (ref 19–32)
CO2: 27 mEq/L (ref 19–32)
CO2: 31 mEq/L (ref 19–32)
Calcium: 8.6 mg/dL (ref 8.4–10.5)
Calcium: 8.9 mg/dL (ref 8.4–10.5)
Calcium: 9 mg/dL (ref 8.4–10.5)
Chloride: 102 mEq/L (ref 96–112)
Chloride: 103 mEq/L (ref 96–112)
Chloride: 109 mEq/L (ref 96–112)
Creatinine, Ser: 0.94 mg/dL (ref 0.4–1.5)
Creatinine, Ser: 1.15 mg/dL (ref 0.4–1.5)
Creatinine, Ser: 1.17 mg/dL (ref 0.4–1.5)
Creatinine, Ser: 1.2 mg/dL (ref 0.4–1.5)
GFR calc Af Amer: >60 mL/min (ref >60)
GFR calc Af Amer: >60 mL/min (ref >60)
GFR calc Af Amer: >60 mL/min (ref >60)
GFR calc Af Amer: >60 mL/min (ref >60)
GFR calc non Af Amer: >60 mL/min (ref >60)
GFR calc non Af Amer: >60 mL/min (ref >60)
GFR calc non Af Amer: >60 mL/min (ref >60)
GFR calc non Af Amer: >60 mL/min (ref >60)
Glucose, Bld: 111 mg/dL — ABNORMAL HIGH (ref 70–99)
Glucose, Bld: 138 mg/dL — ABNORMAL HIGH (ref 70–99)
Glucose, Bld: 143 mg/dL — ABNORMAL HIGH (ref 70–99)
Potassium: 3.8 mEq/L (ref 3.5–5.1)
Potassium: 4.1 mEq/L (ref 3.5–5.1)
Potassium: 4.4 mEq/L (ref 3.5–5.1)
Sodium: 137 mEq/L (ref 135–145)
Sodium: 140 mEq/L (ref 135–145)
Sodium: 141 mEq/L (ref 135–145)

## 2010-08-13 LAB — COMPREHENSIVE METABOLIC PANEL
Albumin: 3.3 g/dL — ABNORMAL LOW (ref 3.5–5.2)
Alkaline Phosphatase: 78 U/L (ref 39–117)
BUN: 13 mg/dL (ref 6–23)
CO2: 28 mEq/L (ref 19–32)
Chloride: 103 mEq/L (ref 96–112)
Creatinine, Ser: 1.02 mg/dL (ref 0.4–1.5)
GFR calc non Af Amer: >60 mL/min (ref >60)
Potassium: 3.8 mEq/L (ref 3.5–5.1)
Total Bilirubin: 0.3 mg/dL (ref 0.3–1.2)

## 2010-08-13 LAB — CBC
HCT: 36.4 % — ABNORMAL LOW (ref 39.0–52.0)
HCT: 38.3 % — ABNORMAL LOW (ref 39.0–52.0)
Hemoglobin: 13.2 g/dL (ref 13.0–17.0)
MCHC: 34.4 g/dL (ref 30.0–36.0)
MCV: 89.7 fL (ref 78.0–100.0)
Platelets: 154 10*3/uL (ref 150–400)
Platelets: 192 10*3/uL (ref 150–400)
RDW: 14.4 % (ref 11.5–15.5)
RDW: 14.4 % (ref 11.5–15.5)
WBC: 8.3 10*3/uL (ref 4.0–10.5)

## 2010-08-13 LAB — CK TOTAL AND CKMB (NOT AT ARMC)
CK, MB: 2.4 ng/mL (ref 0.3–4.0)
CK, MB: 2.7 ng/mL (ref 0.3–4.0)
Relative Index: 2.6 — ABNORMAL HIGH (ref 0.0–2.5)
Relative Index: 2.7 — ABNORMAL HIGH (ref 0.0–2.5)
Relative Index: INVALID (ref 0.0–2.5)
Total CK: 88 U/L (ref 7–232)

## 2010-08-13 LAB — DIFFERENTIAL
Basophils Absolute: 0 10*3/uL (ref 0.0–0.1)
Basophils Absolute: 0.1 10*3/uL (ref 0.0–0.1)
Basophils Relative: 1 % (ref 0–1)
Eosinophils Relative: 4 % (ref 0–5)
Eosinophils Relative: 5 % (ref 0–5)
Lymphocytes Relative: 16 % (ref 12–46)
Lymphocytes Relative: 17 % (ref 12–46)
Lymphs Abs: 1.3 10*3/uL (ref 0.7–4.0)
Monocytes Absolute: 0.7 10*3/uL (ref 0.1–1.0)
Neutro Abs: 5.4 10*3/uL (ref 1.7–7.7)
Neutro Abs: 6 10*3/uL (ref 1.7–7.7)
Neutrophils Relative %: 72 % (ref 43–77)

## 2010-08-13 LAB — PROTIME-INR: Prothrombin Time: 13.4 seconds (ref 11.6–15.2)

## 2010-08-13 LAB — TROPONIN I
Troponin I: 0.05 ng/mL (ref 0.00–0.06)
Troponin I: 0.05 ng/mL (ref 0.00–0.06)

## 2010-08-13 LAB — TSH: TSH: 3.417 u[IU]/mL (ref 0.350–4.500)

## 2010-08-13 LAB — CARDIAC PANEL(CRET KIN+CKTOT+MB+TROPI)
Relative Index: INVALID (ref 0.0–2.5)
Total CK: 68 U/L (ref 7–232)
Troponin I: 0.05 ng/mL (ref 0.00–0.06)

## 2010-08-13 LAB — APTT: aPTT: 27 seconds (ref 24–37)

## 2010-08-13 LAB — PHOSPHORUS: Phosphorus: 2.6 mg/dL (ref 2.3–4.6)

## 2010-08-14 LAB — CBC
HCT: 38.6 % — ABNORMAL LOW (ref 39.0–52.0)
MCHC: 34.2 g/dL (ref 30.0–36.0)
MCHC: 34.4 g/dL (ref 30.0–36.0)
MCV: 88.9 fL (ref 78.0–100.0)
MCV: 88.9 fL (ref 78.0–100.0)
Platelets: 210 10*3/uL (ref 150–400)
Platelets: 215 10*3/uL (ref 150–400)
RBC: 4.34 MIL/uL (ref 4.22–5.81)
RBC: 4.79 MIL/uL (ref 4.22–5.81)
RDW: 14.4 % (ref 11.5–15.5)
WBC: 10.6 10*3/uL — ABNORMAL HIGH (ref 4.0–10.5)
WBC: 8.8 10*3/uL (ref 4.0–10.5)

## 2010-08-14 LAB — BASIC METABOLIC PANEL
BUN: 17 mg/dL (ref 6–23)
BUN: 19 mg/dL (ref 6–23)
CO2: 24 mEq/L (ref 19–32)
Calcium: 8.6 mg/dL (ref 8.4–10.5)
Chloride: 106 mEq/L (ref 96–112)
Chloride: 106 mEq/L (ref 96–112)
Creatinine, Ser: 0.91 mg/dL (ref 0.4–1.5)
Creatinine, Ser: 1.15 mg/dL (ref 0.4–1.5)
GFR calc Af Amer: >60 mL/min (ref >60)
GFR calc non Af Amer: >60 mL/min (ref >60)
Glucose, Bld: 110 mg/dL — ABNORMAL HIGH (ref 70–99)
Potassium: 4.2 mEq/L (ref 3.5–5.1)

## 2010-08-14 LAB — COMPREHENSIVE METABOLIC PANEL
ALT: 43 U/L (ref 0–53)
AST: 35 U/L (ref 0–37)
CO2: 26 mEq/L (ref 19–32)
Calcium: 8.8 mg/dL (ref 8.4–10.5)
GFR calc Af Amer: >60 mL/min (ref >60)
GFR calc non Af Amer: 60 mL/min — ABNORMAL LOW (ref >60)
Sodium: 140 mEq/L (ref 135–145)
Total Protein: 6.8 g/dL (ref 6.0–8.3)

## 2010-08-14 LAB — CARDIAC PANEL(CRET KIN+CKTOT+MB+TROPI)
CK, MB: 5.1 ng/mL — ABNORMAL HIGH (ref 0.3–4.0)
Relative Index: INVALID (ref 0.0–2.5)
Total CK: 106 U/L (ref 7–232)
Total CK: 93 U/L (ref 7–232)
Total CK: 96 U/L (ref 7–232)
Troponin I: 2.25 ng/mL (ref 0.00–0.06)

## 2010-08-14 LAB — LIPID PANEL
LDL Cholesterol: 122 mg/dL — ABNORMAL HIGH (ref 0–99)
Total CHOL/HDL Ratio: 7 RATIO
Triglycerides: 135 mg/dL (ref <150)
VLDL: 27 mg/dL (ref 0–40)

## 2010-08-14 LAB — PROTIME-INR: Prothrombin Time: 13.7 seconds (ref 11.6–15.2)

## 2010-08-16 ENCOUNTER — Other Ambulatory Visit: Payer: Self-pay | Admitting: Cardiology

## 2010-08-18 ENCOUNTER — Other Ambulatory Visit: Payer: Self-pay | Admitting: Cardiology

## 2010-08-18 ENCOUNTER — Other Ambulatory Visit: Payer: Self-pay | Admitting: Internal Medicine

## 2010-08-30 ENCOUNTER — Ambulatory Visit (INDEPENDENT_AMBULATORY_CARE_PROVIDER_SITE_OTHER): Payer: Medicare Other | Admitting: *Deleted

## 2010-08-30 ENCOUNTER — Other Ambulatory Visit: Payer: Self-pay | Admitting: Internal Medicine

## 2010-08-30 DIAGNOSIS — I509 Heart failure, unspecified: Secondary | ICD-10-CM

## 2010-08-30 DIAGNOSIS — I428 Other cardiomyopathies: Secondary | ICD-10-CM

## 2010-09-09 NOTE — Progress Notes (Signed)
icd remote w/icm  

## 2010-09-12 ENCOUNTER — Encounter: Payer: Self-pay | Admitting: *Deleted

## 2010-09-18 NOTE — Op Note (Signed)
NAME:  Nathan Pruitt, Nathan Pruitt NO.:  [id number]   MEDICAL RECORD NO.:  [id number]          PATIENT TYPE:  INP   LOCATION:  2908                         FACILITY:  [facility]   PHYSICIAN:  Hillis Range, MD       DATE OF BIRTH:  10-31-1945   DATE OF PROCEDURE:  DATE OF DISCHARGE:                               OPERATIVE REPORT   SURGEON:  Hillis Range, MD   PREPROCEDURE DIAGNOSES:  1. Ventricular tachycardia.  2. Ischemic cardiomyopathy.  3. 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  4. Sinus bradycardia.  5. Atrial flutter status post recent ablation.   POSTPROCEDURE DIAGNOSES:  1. Ventricular tachycardia.  2. Ischemic cardiomyopathy.  3. 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  4. Sinus bradycardia.  5. Atrial flutter status post recent ablation.   PROCEDURES:  1. Dual-chamber ICD implantation.  2. DFT testing.  3. Noninvasive program stimulation with ATP testing.   INTRODUCTION:  Nathan Pruitt is a pleasant 65 year old gentleman with a  history of an ischemic cardiomyopathy (ejection fraction 35-40%), New  York Heart Association class II/III heart failure, and coronary artery  disease, who was admitted with sustained ventricular tachycardia.  He  underwent EP study earlier today and was found to have inducible  ventricular tachycardia despite medical therapy with amiodarone.  He  underwent atrial flutter ablation for atrial flutter.  Upon initial  presentation.  He was noted to have a mild elevation in his cardiac  markers; however, a left heart catheterization revealed a chronically  occluded right coronary artery and no acute stenosis.  Medical  management was recommended.  It was felt that the patient's non-ST-  elevation MI was secondary to sustained ventricular tachycardia.  He,  therefore, presents for ICD implantation.   DESCRIPTION OF THE PROCEDURE:  Informed written consent was obtained,  and the patient was  brought to the electrophysiology lab in the fasting  state.  He underwent EP study with inducible ventricular tachycardia of  a right bundle-branch superior axis with a cycle length of 380 msec as  reported separately.  He underwent cavotricuspid isthmus ablation as  reported separately.  The patient's left chest was then prepped and  draped in the usual sterile fashion by the EP lab staff.  The skin  overlying the left deltopectoral region was infiltrated with lidocaine  for local analgesia.  A 5-cm incision was then made over the  deltopectoral region.  A left subcutaneous pacemaker pocket was  fashioned using a combination of sharp and blunt dissection.  A cephalic  vein could not be directly visualized.  The left axillary vein was  accessed using a modified percutaneous Seldinger technique without the  requirement of contrast dye.  The left axillary vein was then cannulated  and through this vein a Medtronic model [id number] (serial number  [id number]) right atrial lead and a Medtronic Sprint Quattro secure  model [id number] (serial number [id number]) right ventricular defibrillator  lead were advanced into the right atrial appendage and right ventricular  apex positions respectively.  Initial lead measurements revealed an  atrial lead P-wave of 3.1 mV  with an impedance of 773 ohms and a  threshold of 1.2 V at 0.5 msec.  The right ventricular lead R-wave  measured 12.8 mV with impedance of 912 ohms and a threshold of 0.9 V at  0.5 msec.  The leads were then secured to the pectoralis fascia using #2  silk suture over the suture sleeves.  The pocket was then irrigated with  copious gentamicin solution.  Hemostasis was assured with the  electrocautery.  The leads were then connected to a Medtronic Virtuoso  II DR model [id number] (serial number [id number]) dual-chamber ICD.  The  ICD was placed into the previously fashioned subcutaneous pocket.  The  pocket was then closed in 2 layers with 2.0  Vicryl suture for the  subcutaneous and subcuticular layers.  Steri-Strips and a sterile  dressing were then applied.  Defibrillator threshold testing was then  performed at a programmed sensitivity of 1.2 mV.  Ventricular  fibrillation was induced with a T-shock and adequately sensed with  minimal dropout.  The patient was successfully defibrillated with a  single 10 joule shock delivered from the device to sinus rhythm.  Noninvasive program stimulation was then performed and ventricular  tachycardia was easily and reproducibly induced with programmed extra  stimulus testing through the right ventricular lead at 500/320/330 msec.  The tachycardia cycle length was 400 msec.  Ventricular tachycardia  could not be detected when programmed at a detection rate of 400 msec  and was therefore reprogrammed at 440 msec with adequate detection of  ventricular tachycardia.  Antitachycardia pacing was performed at burst  pacing at 81, 84, and 88% which were all unsuccessful in terminating  tachycardia.  Ramp pacing was also unsuccessful in terminating  tachycardia at 88% of the cycle length.  Burst pacing successfully  terminated tachycardia at 78% of the tachycardia cycle length.  Ramp  pacing successfully terminated the ventricular tachycardia at 81 and 84%  respectively.  The procedure was therefore considered completed.  There  were no early apparent complications.   CONCLUSIONS:  1. Successful implantation of a Medtronic Virtuoso II DR dual-chamber      implantable cardioverter-defibrillator.  2. Defibrillation threshold less than or equal to 10 joules.  3. Easily-inducible ventricular tachycardia with a cycle length of 400      msec which was successfully terminated with burst pacing at 78% of      this tachycardia cycle length, ramp pacing at 81% of the      tachycardia cycle length, and ramp pacing at 84% of the tachycardia      cycle length.  4. No early apparent complications.       Hillis Range, MD  Electronically Signed     JA/MEDQ  D:  09/22/2008  T:  09/23/2008  Job:  [id number]   cc:   Marca Ancona, MD  Charlott Rakes, MMD

## 2010-09-18 NOTE — Assessment & Plan Note (Signed)
OFFICE VISIT   Boodram, Reda  DOB:  10/01/1945                                       12/22/2007  CHART#:[id number]   I had seen the patient in consultation concerning his right leg pain on  12/08/2007.  He had a left to right fem-fem bypass graft with a 6 mm  PTFE graft in April of this year by Dr. Georgiana Shore.  This graft occluded  and he was sent for vascular consultation.  I reviewed his arteriogram  from February which shows a long segment occlusion of his right common  iliac and external iliac artery.  He had some mild irregularity  throughout the iliac system on the left but no real focal stenosis seen  on his arteriogram which had been done back in February.  He  subsequently had a CT scan which shows the iliac artery is patent  although there is significant calcific disease within it.  After a long  discussion previously I had recommended a nonoperative approach with  tobacco cessation and a structured walking program and cilostazol.  He  felt that he could not tolerate his symptoms and wished to pursue  revascularization.  All things considered I felt the best option was a  redo left to right fem-fem bypass graft with a larger graft.   He comes in today to discuss his surgery.  We had a 30 minutes  discussion and I answered all of his questions.  I have explained the  risks with surgery to include but are not limited to a graft infection,  graft thrombosis, bleeding and persistent leg pain.  All his questions  were answered and he is agreeable to proceed.  He does state that he is  going to try to quit tobacco and obtain a prescription for Chantix.  His  surgery has been scheduled for August 26.   [name]. Edilia Bo, M.D.  Electronically Signed   CSD/MEDQ  D:  12/22/2007  T:  12/23/2007  Job:  [id number]

## 2010-09-18 NOTE — Consult Note (Signed)
NAME:  Nathan Pruitt, Nathan Pruitt NO.:  [id number]   MEDICAL RECORD NO.:  [id number]          PATIENT TYPE:  EMS   LOCATION:  MAJO                         FACILITY:  [facility]   PHYSICIAN:  Hillis Range, MD       DATE OF BIRTH:  [birth year]   DATE OF CONSULTATION:  10/12/2008  DATE OF DISCHARGE:  10/12/2008                                 CONSULTATION   REQUESTING PHYSICIAN:  Emergency room physician.   REASON FOR CONSULTATION:  ICD shock.   HISTORY OF PRESENT ILLNESS:  Nathan Pruitt is a pleasant 65 year old  gentleman, well known to me, who has a history of ventricular  tachycardia and is status post implantation of a Medtronic Virtuoso II  DR ICD implanted on Sep 22, 2008.  The patient reports being in his  usual state of health until this afternoon while sitting at his home at  approximately 3 p.m.  He reports a brief dizziness followed by an ICD  shock.  He denies chest pain, shortness of breath, palpitation,  presyncope, or syncope.  He now presents for further evaluation and  management.  He denies any other episodes of sustained tachycardia or  ICD shocks.  He denies any recent changes in his health state,  otherwise.  He has been compliant with medical therapies without  difficulties.  He is unaware of any precipitants or triggers for this  recent event.  He is otherwise without complaint at this time.   PAST MEDICAL HISTORY:  1. Ventricular tachycardia.  2. Ischemic cardiomyopathy.  3. 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  4. Sinus bradycardia.  5. Typical atrial flutter, status post cavotricuspid isthmus ablation      Sep 22, 2008.  6. Status post dual-chamber Medtronic Virtuoso II ICD, Sep 22, 2008.  7. Coronary artery disease.  8. Hypertension.  9. Hyperlipidemia.  10.Chronic tobacco use.  11.Peripheral artery disease.  12.Rheumatoid arthritis.   HOME MEDICATIONS:  1. Aspirin 325 mg daily.  2. Simvastatin 80 mg daily.  3. Zetia  10 mg daily.  4. Diovan 40 mg daily.  5. Coreg 12.5 mg b.i.d.  6. Lasix 20 mg daily.  7. Leflunomide 20 mg daily.   ALLERGIES:  No known drug allergies.   FAMILY HISTORY:  Notable for cancer and coronary artery disease.   SOCIAL HISTORY:  The patient lives in [place] alone.  He has a  longstanding history of tobacco, but quit in August 2009.  He rarely  drinks alcohol and denies drug use.   REVIEW OF SYSTEMS:  All systems are reviewed and negative except as  outlined above.   PHYSICAL EXAMINATION:  VITAL SIGNS:  Blood pressure 110/78, heart rate  58, respirations 18, sats 98%, afebrile.  GENERAL:  The patient is a well-appearing male in no acute distress.  He  is alert and oriented x3.  HEENT:  Normocephalic and atraumatic.  Sclerae clear.  Conjunctivae  pink.  Oropharynx clear.  NECK:  Supple.  No thyromegaly, JVD, or bruits.  LUNGS:  Clear to auscultation bilaterally.  HEART:  Regular rate and rhythm.  No murmurs, rubs, or  gallops.  GI:  Soft, nontender, and nondistended.  Positive bowel sounds.  EXTREMITIES:  No clubbing, cyanosis, or edema.  NEUROLOGIC:  Cranial nerves II through XII are intact.  Strength and  sensation are intact.  SKIN:  No ecchymosis or lacerations.  MUSCULOSKELETAL:  No deformity or atrophy.  PSYCHIATRIC:  Euthymic mood.  Full affect.   The patient's ICD pocket appears to be healing nicely without evidence  of an infection or hematoma.   DEVICE INTERROGATION:  The patient's ICD is interrogated today and found  to be functioning appropriately and the MVP pacing mode with a low-rate  limit of 50 beats per minute.  The battery voltage is 3.17 volts with a  charge time of 8.3 seconds.  The atrial lead impedance measures 494 ohms  with a P-wave of 1 mV and a threshold of 0.75 volts of 0.4 milliseconds.  The ventricular lead R-wave measures 13.9 mV with an impedance of 551  ohms and a threshold of 0.75 volts of 0.4 milliseconds.  The patient has  been  found to have 10 episodes of ventricular tachycardia since last  interrogation with 2 episodes of ventricular tachycardia today.  Nine of  the 10 episodes were terminated with antitachycardia pacing.  The  patient did have 1 episode of ventricular tachycardia today, which  accelerated to the ventricular fibrillation zone with antitachycardia  pacing and was successfully terminated with a single 20-joule shock  delivered from the device.  No atrial arrhythmias were identified and  the device appears to be functioning appropriately.   EKG today reveals sinus rhythm with no ST-T wave changes.  He has a  chronic inferior scar.   LABORATORY DATA:  Cardiac markers are negative.  INR 1.  Hematocrit 38  and white blood cell count 7.8.  Potassium 3.8 and creatinine 1.   IMPRESSION:  Nathan Pruitt is a pleasant 65 year old gentleman with a  history of ischemic cardiomyopathy, coronary artery disease, and known  ventricular tachycardia who presents following an implantable  cardioverter-defibrillator shock today.  Interrogation of his device  confirms that he received therapy for ventricular tachycardia.  It looks  that his device predominantly is able to treat his ventricular  tachycardia successfully with antitachycardia pacing.  The patient is  presently without any symptoms of ischemia or heart failure.  He request  discharge from emergency department to home.   PLAN:  We will increase the patient's Coreg to 25 mg twice daily at this  time.  I will have the patient return to clinic in 2 weeks for further  assessment.  He is instructed to contact me if he has ICD shocks in the  interim.  We will set the patient up for home monitoring of his device.  If the patient continues to have frequent episodes of ventricular  tachycardia, then we will consider hospitalization for initiation of  sotalol or outpatient initiation of amiodarone.      Hillis Range, MD  Electronically Signed      JA/MEDQ  D:  10/12/2008  T:  10/13/2008  Job:  [id number]   cc:   Marca Ancona, MD  Charlott Rakes

## 2010-09-18 NOTE — Op Note (Signed)
NAME:  LAWERENCE, DERY            ACCOUNT NO.:  [id number]   MEDICAL RECORD NO.:  [id number]          PATIENT TYPE:  INP   LOCATION:  2550                         FACILITY:  [facility]   PHYSICIAN:  [name]. Edilia Bo, M.D.DATE OF BIRTH:  Mar 13, 1946   DATE OF PROCEDURE:  12/30/2007  DATE OF DISCHARGE:                               OPERATIVE REPORT   PREOPERATIVE DIAGNOSIS:  Occluded femoral-femoral bypass graft with  right lower extremity claudication.   POSTOPERATIVE DIAGNOSIS:  Occluded femoral-femoral bypass graft with  right lower extremity claudication.   PROCEDURE:  Vein patch angioplasty of the left external iliac artery and  redo left-to-right fem-fem bypass graft with an 8-mm Dacron graft.   SURGEON:  [name]. Edilia Bo, MD   ASSISTANT:  1. Jerold Coombe, PA  2. Wilmon Arms, PA   ANESTHESIA:  General.   INDICATIONS:  This is a 65 year old gentleman who had undergone a left-  to-right fem-fem bypass with a 6-mm PTFE graft in April 2009 at an  outlined institution.  He presented with a clotted graft and presented  for vascular consultation.  His arteriogram suggested some mild diffuse  iliac disease, but really no focal stenosis and subsequent CT scan again  confirmed some calcific disease in the iliac system but no focal  stenosis.  This was felt to be adequate inflow.  I therefore elected to  redo his fem-fem bypass graft with a larger graft.   TECHNIQUE:  The patient was taken to the operating room and received a  general anesthetic.  The groins were prepped and draped in usual sterile  fashion.  The incision of the left groin was opened and through scar  tissue the fem-fem bypass graft was dissected free.  It was chronically  occluded and it was divided.  I then dissected out the common femoral  artery and external iliac artery proximal to the anastomosis.  Of note,  there appeared to be a stenosis and plaque within the distal external  iliac artery  on the left.  I therefore elected to get above this, so  that I could patch this area.  The deep femoral and superficial femoral  arteries were also controlled.  Next, the right groin incision was made  and again through scar tissue, the fem-fem bypass graft was dissected  free and divided.  The common femoral, superficial femoral, and deep  femoral arteries were all controlled with vessel loops.  Both  superficial femoral arteries were patent.  An 8-mm Dacron graft was then  tunneled between the 2 incisions.  From the incision in the left groin,  I was able to dissect out a length of the greater saphenous vein to be  used as a vein patch on the external iliac artery on the left.  The  patient was then heparinized.  The left external iliac artery was  clamped and the superficial femoral and deep femoral arteries were  controlled.  The old graft was removed from the artery and then the  arteriotomy extended up to the plaque.  The small plaque was  endarterectomized and I got up above this so  that a vein patch could be  placed.  The vein patch was then sewn using continuous 5-0 Prolene  suture.  Again this was with the greater saphenous vein from the left  leg.  Next, a venotomy was made in the vein patch and the left limb of  the fem-fem graft was spatulated and sewn end-to-side to the vein patch  using continuous 5-0 Prolene suture.  Next, prior to completing this  anastomosis, the arteries were back-bled and flushed and there was good  inflow.  The graft was then clamped and then flow reestablished to the  left leg.  On the right side, the common femoral, superficial femoral,  and deep femoral arteries were controlled and the old graft was taken  off the artery.  The new segment of graft was spatulated, cut to the  appropriate length, and sewn end-to-side to the artery using continuous  5-0 Prolene suture.  At the completion, there was excellent Doppler flow  on both sides with a palpable  posterior tibial pulse bilaterally.  Hemostasis was obtained in the wounds.  The wounds were closed with deep  layer of 2-0 Vicryl.  The subcutaneous layer was closed with 3-0 Vicryl  and the skin closed with a 4-0 subcuticular stitch.  Sterile dressing  was applied.  The patient tolerated the procedure well and was  transferred to the recovery room in satisfactory condition.  All needle  and sponge counts were correct.      [name]. Edilia Bo, M.D.  Electronically Signed     CSD/MEDQ  D:  12/30/2007  T:  12/31/2007  Job:  [id number]

## 2010-09-18 NOTE — Assessment & Plan Note (Signed)
OFFICE VISIT   Nathan Pruitt, Nathan Pruitt  DOB:  Oct 23, 1945                                       08/03/2009  CHART#:[id number]   I saw the patient in the office today for followup of his peripheral  vascular disease.  This is a pleasant 65 year old gentleman who had  previously undergone a left to right fem-fem bypass graft at another  institution.  He had presented with a clotted graft and I did a redo  bypass on 12/30/2007.  He had vein patch angioplasty of the left  external iliac artery and a redo left to right fem-fem bypass with an 8  mm Dacron graft.  He comes in for a routine followup visit.  Since I saw  him last he has had no history of claudication, rest pain or nonhealing  ulcers.  He does occasionally get some pain in his right hip which  occurs after he has been ambulating but not necessarily during  ambulation.  This has been stable and has been going on for several  months.  There are really no other aggravating or alleviating factors  associated with this.   PAST MEDICAL HISTORY:  Is significant for hypertension and  hypercholesterolemia both of which are stable on his current  medications.  He also has a pacemaker and a defibrillator because of a  history of ventricular tachycardia.   SOCIAL HISTORY:  He is single.  He quit tobacco 19 months ago.   REVIEW OF SYSTEMS:  CARDIOVASCULAR:  He has had no chest pain, chest  pressure, palpitations or arrhythmias.  He has had no history of DVT or  phlebitis.  He has had no strokes or TIAs or amaurosis fugax.  PULMONARY:  He has had no productive cough, bronchitis, asthma or  wheezing.   PHYSICAL EXAMINATION:  General:  This is a pleasant 65 year old  gentleman who appears his stated age.  Vital signs:  Blood pressure is  139/85, heart rate is 54, respiratory rate 16.  HEENT:  Unremarkable.  Lungs:  Are clear bilaterally to auscultation without rales, rhonchi or  wheezing.  Cardiovascular:  I do not  detect any carotid bruits.  He has  a regular rate and rhythm without murmur appreciated.  He has no  significant peripheral edema.  He has palpable radial, femoral and  posterior tibial pulses bilaterally.  He has an easily palpable graft  pulse.  Abdomen:  Soft and nontender with normal pitched bowel sounds.  Neurologic:  Exam is nonfocal.   He did have an arterial Doppler study in our office today which I  independently interpreted and this shows an ABI of 94% on the right and  92% on the left.  These ABIs are stable compared to a study back in  February of 2010.  He also had a graft duplex which I independently  interpreted and this shows a patent fem-fem bypass graft with some  mildly elevated velocities proximal to the inflow segment of the graft  on the left.  This is where he had the vein patch angioplasty.   Overall I am pleased with his progress.  I have encouraged him to stay  off the cigarettes.  I have ordered followup ABIs and duplex scan in 1  year.  I will see him back at that time.  He knows to call sooner if he  has problems.     [name]. Edilia Bo, M.D.  Electronically Signed   CSD/MEDQ  D:  08/03/2009  T:  08/04/2009  Job:  [id number]   cc:   Dr Charlott Rakes

## 2010-09-18 NOTE — H&P (Signed)
NAME:  Nathan Pruitt, Nathan Pruitt NO.:  [id number]   MEDICAL RECORD NO.:  [id number]          PATIENT TYPE:  INP   LOCATION:  2908                         FACILITY:  [facility]   PHYSICIAN:  Marca Ancona, MD      DATE OF BIRTH:  October 11, 1945   DATE OF ADMISSION:  09/20/2008  DATE OF DISCHARGE:                              HISTORY & PHYSICAL   PRIMARY CARDIOLOGIST:  [facility], being seen by Dr.  Shirlee Latch.   PRIMARY CARE Delontae Lamm:  Charlott Rakes, MD   PATIENT PROFILE:  A 65 year old Caucasian male without prior history of  coronary artery disease who presents and transferred for [facility] with non-ST elevation MI, intermittent atrial flutter, and  ventricular tachycardia.   PROBLEMS:  1. Non-ST-segment elevation MI.      a.     The patient reports history of silent MI in 1993.      b.     The patient reports history of 2-D echocardiogram in March       2009, showing an EF of 40%.      c.     The patient reports history of Myoview in March 2009, which       was reportedly normal.  2. Hypertension.  3. Hyperlipidemia.  4. Remote tobacco abuse with 47-pack-year history, quitting in August      2009.  5. Peripheral arterial disease.      a.     Status post left-to-right fem-fem bypass performed in       [place] in March 2009.      b.     Status post redo left-to-right fem-fem bypass performed by       Dr. Edilia Bo here at [facility].  6. Rheumatoid arthritis.  7. Status post vasectomy.  8. Status post tonsillectomy.   HISTORY OF PRESENT ILLNESS:  A 65 year old Caucasian male with the above  problem list.  The patient has a history of fluttering sensation in his  chest occurring approximately twice a year lasting a few minutes and  resolving spontaneously.  This dates back for about 5 years or so.  The  patient also has reported history of silent MI in 1993 and had  preoperative evaluation by [facility] in March 2009,  with 2-D  echocardiogram reportedly showing an EF of 40% and Myoview  apparently showing no evidence of ischemia.  The patient has never had  cardiac catheterization.  The patient was in his usual state of health  until this past Saturday when he awoke suddenly at 1:30 in the morning  with tachypalpitations and a not in the center of his chest with some  radiation to his jaw.  He felt weak and diaphoretic as well as mildly  dyspneic.  He continued to have symptoms from 1:30 a.m. until 4 p.m.  later that day when symptoms resolved spontaneously.  During the period  of symptoms, he said he did very little and any activity which was very  taxing.  He felt well the remainder of Saturday and all day Sunday and  then yesterday afternoon at around 12  o'clock he had recurrence of  tachypalpitations with knot-like sensation in the center of his chest  associated with dyspnea and diaphoresis.  At this time, it persisted for  about 8-1/2 hours prior to easing off some or he says just enough so  that he could fall asleep.  He later awoke at 1:30 in the morning and  saying that he was markedly dyspneic and says he just cannot get a  breath.  He also has a knot-like sensation and fluttering.  He sat up  for about an hour with some improvement and was able to go back to bed.  He unfortunately, woke again at 3:30 this morning with recurrence of  symptoms and he sat up for 2 hours at this time prior to calling EMS at  about 5:30 in the morning.  Upon EMS arrival, he was noted to have a  wide complex tachycardia and he was placed on aspirin.  His tachycardia  then slowed showing atrial flutter.  He was subsequently taken to the  [facility].  While in the ER there, he had  multiple recurrent episodes of tachypalpitations, at this time  associated with a flushing sensation and on the monitor he was noted to  be in wide complex tachycardia, most consistent with ventricular  tachycardia.  The  patient was placed on amiodarone therapy and given a  heparin bolus as well as aspirin and Lipitor 80 mg.  The patient then  requested, transferred to [facility] after being seen by Dr. Tomie China.  Currently, he is pain free.   ALLERGIES:  No known drug allergies.   HOME MEDICATIONS:  1. Aspirin 325 mg daily.  2. Simvastatin 80 mg daily.  3. Folic acid 1 mg daily.  4. Diovan 80 mg daily.  5. Leflunomide 20 mg daily.   FAMILY HISTORY:  Mother died in her late 47s with gastric cancer.  Father died in his late 84s with throat cancer and PVD.  He had a  brother who died at 38 of an MI.   SOCIAL HISTORY:  He lives in [place] by himself.  He is retired from  [employer], he worked with computers.  He has a 47-pack-year  history of tobacco abuse and quitting in August 2009.  He uses minimal  alcohol.  He denies drug use.  Routinely exercising.   REVIEW OF SYSTEMS:  Positive for chest knot, dyspnea, diaphoresis, mild  presyncope, tachypalpitations, PND, orthopnea, and arthralgias.  Otherwise all systems reviewed are negative.  He is full code.   PHYSICAL EXAMINATION:  VITAL SIGNS:  Temperature 97.8, heart rate 77,  respirations 16, blood pressure 143/83, and pulse ox 98% on 2 L.  GENERAL:  A pleasant white male in no acute distress.  Awake and  oriented x3.  HEENT:  Normal.  NEURO:  Grossly intact.  Nonfocal.  SKIN:  Warm and dry without lesions or masses.  MUSCULOSKELETAL:  Grossly normal without deformity or effusion.  He has  a normal affect.  NECK:  Supple without bruits.  JVP measures approximately 12 cm.  LUNGS:  Respirations are regular and unlabored.  Clear to auscultation.  CARDIAC:  Regular, S1 and S2.  Positive S4.  No murmurs.  ABDOMEN:  Obese, soft, nontender, and nondistended.  Bowel sounds  present x4.  EXTREMITIES:  Warm, dry, and pink.  No clubbing, cyanosis, or edema.  Dorsalis pedis and posterior tibial pulses 1+ and equal bilaterally.  There are no femoral  bruits.  Left femoral  pulse is strong.   LABORATORY DATA:  Chest x-ray from [facility] shows no acute disease.  EKG  shows sinus rhythm with PVC, left axis deviation.  He has previous  inferior infarct with T-wave inversion in II, III, and aVF.  He also has  ST depression and T wave inversion in V4 through V6.  Hemoglobin 14.8,  hematocrit 43.2, WBC 8.4, and platelets 200.  Sodium 139, potassium 4.7,  chloride 107, CO2 25, BUN 23, creatinine 0.97, and glucose 111.  Total  bilirubin 0.4, alkaline phosphatase 108, AST 52, ALT 63, proBNP 5620, CK  107, MB 0.91, calcium 9.9, magnesium 2.0, and phosphorus 3.5.   ASSESSMENT AND PLAN:  1. Non-ST-evaluation myocardial infarction.  The patient with      concerning symptoms since Saturday.  Multiple risk factors for CAD      include PAD, hypertension, hyperlipidemia, remote tobacco abuse as      well as family history.  We will continue cycle cardiac markers.      Plan cath today.  We will add aspirin, statin, heparin, beta-      blocker, and ARB.  Question primary event versus demand ischemia in      the setting of tachycardia.  2. Atrial flutter/wide complex tachycardia/ventricular tachycardia.      The patient exhibited multiple tachycardias both at home with ECGs      via EMS as well as in the ED.  His wide complex tachycardia is      presumably VT until proven otherwise, especially in setting of      chest discomfort and abnormal troponin.  We will add beta-blocker.      At some point, we will consider Coumadin with a CHADS score of 2      including history of CHF and hypertension.  We will check TSH.  His      lytes and magnesium are okay.  Continue amiodarone for now.  3. Hypertension.  Blood pressure is elevated, resume Diovan and add      beta-blocker.  4. Hyperlipidemia.  Check lipids and LFTs.  Add Crestor 40 (the      patient was on Zocor 80 at home).  5. Rheumatoid arthritis.  Continue home meds.  6. Acute presumably systolic  congestive heart failure.  We will gently      diurese and add beta-blocker and continue ARB.  Check echo today.      Nicolasa Ducking, ANP      Marca Ancona, MD  Electronically Signed    CB/MEDQ  D:  09/20/2008  T:  09/21/2008  Job:  [id number]

## 2010-09-18 NOTE — Procedures (Signed)
BYPASS GRAFT EVALUATION   INDICATION:  Followup fem-fem bypass graft.   HISTORY:  Diabetes:  No.  Cardiac:  Yes.  Hypertension:  Yes.  Smoking:  Previous.  Previous Surgery:  Redo left to right fem-fem bypass graft 12/30/2007 by  Dr. Edilia Bo.   SINGLE LEVEL ARTERIAL EXAM                               RIGHT              LEFT  Brachial:                    165                171  Anterior tibial:             160                151  Posterior tibial:            159                157  Peroneal:  Ankle/brachial index:        0.94               0.92   PREVIOUS ABI:  Date:  06/21/2008  RIGHT:  0.89  LEFT:  0.95   LOWER EXTREMITY BYPASS GRAFT DUPLEX EXAM:   DUPLEX:  Patent femoral-femoral artery bypass graft with no evidence of  in graft stenosis.  Elevated velocities noted in the left external iliac artery of 262 cm/s.   IMPRESSION:  1. Patent femoral-femoral (left to right) bypass graft with no      evidence of in graft stenosis.  2. Elevated velocities noted in the left external iliac artery.  3. Stable mild ankle brachial indices bilaterally.  4. No significant changes from previous study.   ___________________________________________  [name]. Edilia Bo, M.D.   AS/MEDQ  D:  08/03/2009  T:  08/03/2009  Job:  [id number]

## 2010-09-18 NOTE — Op Note (Signed)
NAME:  Nathan Pruitt, Nathan Pruitt NO.:  [id number]   MEDICAL RECORD NO.:  [id number]          PATIENT TYPE:  INP   LOCATION:  2908                         FACILITY:  [facility]   PHYSICIAN:  Hillis Range, MD       DATE OF BIRTH:  February 06, 1946   DATE OF PROCEDURE:  DATE OF DISCHARGE:                               OPERATIVE REPORT   PREPROCEDURE DIAGNOSES:  1. Typical-appearing right atrial flutter.  2. Ventricular tachycardia.   POSTPROCEDURE DIAGNOSES:  1. Typical-appearing right atrial flutter.  2. Ventricular tachycardia.   PROCEDURES:  1. Comprehensive electrophysiology study.  2. Coronary sinus pacing and recording.  3. Arrhythmia induction with pacing.  4. Ablation of supraventricular tachycardia.   INTRODUCTION:  Mr. Nathan Pruitt is a pleasant 65 year old gentleman with a  history of coronary artery disease and ischemic cardiomyopathy with an  ejection fraction of 35-40% who was admitted on transfer from [facility] after presenting with symptomatic atrial flutter as well as a  wide complex tachycardia.  Upon my review of the patient's EKGs, his  tachycardia is felt to be a ventricular tachycardia with a right bundle-  branch superior axis.  He had a non-ST-elevation MI, but this was  thought to be a demand ischemia related to his ventricular tachycardia.  He underwent left heart catheterization at [facility] on Sep 20, 2008, which revealed a chronically occluded right coronary artery  with severe proximal disease and bridging collaterals as well as minor  irregularities within in the left coronary system.  This was felt to be  not an acute coronary syndrome and therefore no intervention was  performed.  Medical management was recommended.  The patient now  presents for atrial flutter ablation as well as EP study to evaluate for  inducible ventricular tachycardia after having been initiated on  amiodarone.   DESCRIPTION OF THE PROCEDURE:  Informed  written consent was obtained and  the patient was brought to the electrophysiology lab in the fasting  state.  He was adequately sedated with intravenous Versed and Fentanyl  as outlined in the nursing report.  The patient's right groin was  prepped and draped in the usual sterile fashion by the EP lab staff.  Using a percutaneous Seldinger technique, one 6-French, one 7-French,  and one 8-French hemostasis sheath were placed in the right common  femoral vein.  A 6-French decapolar Polaris X catheter was introduced  through the right common femoral vein and advanced into the coronary  sinus for recording and pacing from this location.  A 6-French  quadripolar Josephson catheter was introduced to the right common  femoral vein and advanced into the right ventricle for recording and  pacing.  This catheter was then pulled back to the His bundle location.  The patient presented to the electrophysiology lab in sinus bradycardia.  His PR interval measured 210 msec with an A-H interval of 142 msec and H-  V interval of 40 msec.  The RR interval measured 1045 msec, QRS duration  119 msec, and QT interval 475 msec.  Ventricular pacing was performed  which revealed midline decremental VA  conduction with a VA Wenckebach  cycle length of 440 msec with no tachycardias induced.  Rapid atrial  pacing was performed which revealed an AV Wenckebach cycle length of 580  msec with no evidence of PR greater than RR and no tachycardia is  induced.  Rapid atrial pacing was performed down to a cycle length of  200 msec with no atrial flutter induced.  It is noted that the patient  has been loaded with intravenous amiodarone and is currently treated  with oral amiodarone for ventricular tachycardia.  Review of the  patient's surface electrogram from previous spontaneous atrial flutter  was felt to be consistent with typical counterclockwise right atrial  flutter.  I, therefore, elected to perform cavotricuspid  isthmus  ablation.  A 7-French [employer] XP 8-mm ablation  catheter was, therefore, introduced through the right common femoral  vein and advanced into the right atrium.  The ablation catheter was  positioned along the usual cavotricuspid isthmus.  A series of radio-  frequency applications were delivered between the tricuspid valve  annulus and the inferior vena cava along the usual isthmus.  A series of  7 radio-frequency applications were delivered at a target temperature of  60 degrees at 50 watts for 120 seconds each.  Following ablation,  complete bidirectional isthmus block was achieved as evident by a stim  to early atrial activation recorded across the isthmus of 150 msec  bidirectionally.  Programmed extrastimulus testing was then performed  from the right ventricular apex at a basic cycle length of 500 msec.  This revealed midline decremental VA conduction with a retrograde AV  nodal ERP of 500/410 msec.  Sustained monomorphic ventricular  tachycardia was induced with 500/310 msec.  The tachycardia cycle length  was 380 msec.  The tachycardia was a right bundle-branch superior axis  VT with a QRS duration of 167 msec.  Pacing was performed from the right  ventricular apex and tachycardia was terminated with rapid ventricular  pacing at 270 msec.  The SVT was compared to the clinical VT and felt to  be identical.  A single additional radio-frequency application was  delivered again along the usual cavotricuspid isthmus after mapping  along the isthmus revealed double potentials, but occasional atrial  electrograms.  An additional bonus radiofrequency application was  delivered at a target temperature of 60 degrees at 50 watts.  Following  a 20-minute observation period, complete bidirectional isthmus block was  again confirmed by differential pacing from the low lateral right  atrium.  Following ablation, the AH interval measured 148 msec with an  HV interval  of 42 msec.  The procedure was, therefore, considered  completed.  All catheters were removed and the sheaths were aspirated  and flushed.  The sheaths were removed and hemostasis was assured.  There were no early apparent complications.  A dual chamber ICD was then  implanted as reported separately.   CONCLUSIONS:  1. Sinus bradycardia with a first-degree atrioventricular block upon      presentation.  2. No inducible supraventricular tachycardia or atrial flutter as the      patient has been initiated on amiodarone for ventricular      tachycardia.  3. No evidence of dual AV nodal physiology or accessory pathways.  4. Successful cavotricuspid isthmus ablation with complete      bidirectional isthmus block achieved.  5. Easily inducible ventricular tachycardia with a cycle length of 380      msec, despite previous initiation  on amiodarone.  6. No early apparent complications.  7. Dual chamber implantable cardioverter-defibrillator implanted as      reported separately.      Hillis Range, MD  Electronically Signed     JA/MEDQ  D:  09/22/2008  T:  09/23/2008  Job:  [id number]   cc:   Marca Ancona, MD  Charlott Rakes, MD

## 2010-09-18 NOTE — Cardiovascular Report (Signed)
NAME:  Nathan Pruitt, Nathan Pruitt NO.:  [id number]   MEDICAL RECORD NO.:  [id number]          PATIENT TYPE:  INP   LOCATION:  2908                         FACILITY:  [facility]   PHYSICIAN:  Arturo Morton. Riley Kill, MD, FACCDATE OF BIRTH:  07/07/1945   DATE OF PROCEDURE:  09/20/2008  DATE OF DISCHARGE:                            CARDIAC CATHETERIZATION   INDICATIONS:  Mr. Mucci is a 65 year old from [place] who presented  with palpitations, probable atrial flutter and wide complex tachycardia.  His enzymes were positive.  Cardiac catheterization was recommended.  His EKG demonstrated some T-wave inversion in the anterolateral leads.   PROCEDURES:  1. Left heart catheterization.  2. Selective coronary arteriography.  3. Selective left ventriculography.   DESCRIPTION OF PROCEDURE:  The patient was brought to the  catheterization laboratory after informed consent and prepped and draped  in the usual fashion.  We used an ultrasound to try to identify the  femoral artery.  The initial stick was in the fem-fem crossover graft,  but the second stick was successful in gaining access to the femoral  artery and a 5-French sheath was placed.  Following this, views of the  left and right coronary arteries were obtained in multiple angiographic  projections.  Central aortic and left ventricular pressures were then  measured with pigtail and ventriculography was done in the RAO  projection using 22 mL of contrast with a full rate of 11 mL per second.  He tolerated the procedure well.  His ACT was in the 120s, and we  elected to remove the sheath in the laboratory.  I personally held and  gained hemostasis.  His groin was held for approximately 15 minutes.  Careful attention was given to not place undue pressure on the graft.  He was taken to the holding area in satisfactory clinical condition.   HEMODYNAMIC DATA:  1. The central aortic pressure was 156/80, mean 107.  2. Left ventricular  pressure 170/70.  3. There was not a significant gradient or pullback across aortic      valve.   ANGIOGRAPHIC DATA:  1. Ventriculography was done in the RAO projection consistent with the      echocardiogram.  There was a large inferobasal wall motion      abnormality with an inferobasal dyskinetic aneurysmal segment.  The      remainder of the ventricle moved relatively well.  Overall ejection      fraction would be range of 35 to probably 40%, I could not      appreciate significant mitral regurgitation despite the wall motion      abnormality.  2. The right coronary artery appears to be severely diseased.  It is      calcified, diffusely diseased proximally and essentially totally      occluded with fairly marked bridging collaterals.  The vessel was      then totally occluded and there was faint visualization of the      distal vessel.  The distal right circulation is supplied by      collaterals from both the LAD and the circumflex, and in  combination with the wall motion abnormality, and the appearance of      the vessel appears to be a chronic total occlusion and not a recent      occlusion.  3. The left coronary artery demonstrates a fair amount of      calcification.  It is consistent with calcification of the left      main, to some extent the circumflex with mostly in the LAD.  4. The left main is without significant focal obstruction.  5. The left anterior descending artery courses to the apex.  There is      calcification is noted some and some tortuosity, probably about 40%      narrowing of the ostium of the small first diagonal and 30% mid LAD      narrowing after this that a particular focal spot of calcification      which is readily seen on multiple views.  6. The circumflex consists of a ramus intermedius vessel that divides      with a larger superior branch and smaller inferior branch.  The AV      circumflex also is noted.  The AV circumflex probably has  about 20%      minor plaquing.  No high-grade critical stenosis is noted.  As      previously noted the left coronary provides collaterals to the      distal right circulation.   CONCLUSIONS:  1. Moderate reduction in left ventricular function with an inferobasal      aneurysmal segment.  2. Total occlusion of the right coronary artery with severe proximal      disease, and bridging collaterals with left-to-right collaterals      highly suggestive of a longstanding chronic total occlusion.  3. Scattered irregularities of the left coronary system as noted      above.   The findings suggest not an acute coronary syndrome, but likely a  chronic total occlusion of the distal right whether the tachycardia  could have resulted in a demand rise in his enzymes with seem most  likely, primary treatment of the arrhythmic problem will be addressed by  the Electrophysiologic Service.      Arturo Morton. Riley Kill, MD, [facility]  Electronically Signed     TDS/MEDQ  D:  09/20/2008  T:  09/21/2008  Job:  [id number]   cc:   Marca Ancona, MD  [facility] M.D. [facility]  CV Laboratory

## 2010-09-18 NOTE — H&P (Signed)
NAME:  Nathan Pruitt, Nathan Pruitt            ACCOUNT NO.:  [id number]   MEDICAL RECORD NO.:  [id number]          PATIENT TYPE:  INP   LOCATION:  3732                         FACILITY:  [facility]   PHYSICIAN:  Darryl D. Prime, MD    DATE OF BIRTH:  March 16, 1946   DATE OF ADMISSION:  10/13/2008  DATE OF DISCHARGE:                              HISTORY & PHYSICAL   The patient is full code.   PRIMARY CARE PHYSICIAN:  Dr. Yetta Flock.   CARDIOLOGIST:  Darlin Priestly, MD.  He has seen Hillis Range, MD,  electrophysiology, .   HISTORY OF PRESENT ILLNESS:  Nathan Pruitt is a 65 year old male with a  history of myocardial infarction in the past.  He has had a silent MI in  1993.  His last cardiac catheterization is on Sep 20, 2008, and it  showed a moderate reduction of LV function with inferior basal  aneurysmal segments.  His EF was in the range of 35-40% on LV gram.  Total occlusion of right coronary artery with severe proximal disease  and bridging collaterals, left-to-right collaterals, highly suggestive  of longstanding chronic total occlusion.  Scattered irregularities of  the left coronary system.  The patient has a history of possible  ischemic cardiomyopathy with an EF in range of 40%.  History of  hypertension.  History of hyperlipidemia.  He has a history of  significant peripheral vascular disease, status post left-to-right  femoral-femoral bypass performed in [place] in March 2009.  Status post  redo left-to-right femoral-femoral bypass performed by Dr. Edilia Bo here  at [facility].  History of rheumatoid arthritis.  He is status post  vasectomy, status post tonsillectomy, and he has had on May 20, noted to  have ventricular tachycardia on an EP study, atrial flutter with  ablation.  He also had placement of a dual-chamber ICD with DFT testing.  The patient has been doing well up until October 12, 2008.  Around 3 p.m. he  notes having ICD discharge, apparently around 2 discharges.  He was  seen  in the ED and apparently he had a fast heart rhythm.  Discharge plans  were increase his Coreg to 25 mg twice a day from 12.5 mg twice a day.  The patient did well at home until around 10:30 p.m.  While at rest, he  had an episode of ICD discharge and called the answering service.  It  was suggested that the patient be brought here.  He came through to EMS  at [facility].  Family members brought him over there and then he  was transported from [place] to [facility].  The patient denies any  associated chest pain, lightheadedness.  He denies any lower extremity  edema, paroxysmal nocturnal dyspnea or orthopnea here recently.  No  dysuria or hematuria.  He denies any diarrhea, constipation.  He denies  any fevers or cough.  He does live alone.  When seen here, he was given  metoprolol 25 mg p.o. daily as for documented heart rates of 104.  Heart  rate is currently at 60 beats per minute at the time of dictation.  PAST MEDICAL AND SURGICAL HISTORY:  As above.  He has a history of  tobacco abuse as well.   ALLERGIES:  No known drug allergies.   MEDICATIONS:  1. He is on aspirin 325 mg daily.  2. Vytorin 10/80 mg q.h.s.  3. Diovan 40 mg daily.  4. Carvedilol 25 mg twice a day.  5. Leflunomide 20 mg daily.  6. Lasix 20 mg daily.  7. Cholecalciferol 200 units daily, D3 oral.   SOCIAL HISTORY:  The patient has a history of tobacco abuse in the past,  47 pack-years approximately.  Quit in August 2009.  Minimal alcohol.  No  illicit drug.  Routinely exercises.  He lives alone.  He is retired from  [employer].   FAMILY HISTORY:  Mother died in her late 22s with gastric cancer.  Father died in his late 56s with throat cancer.  He also had peripheral  vascular disease.  He had a brother who died of an MI at the age of 26.   REVIEW OF SYSTEMS:  A 14-point review of systems negative unless stated  above.   PHYSICAL EXAMINATION:  VITAL SIGNS:  Temperature is 97 with pulse  of 60,  respiratory rate of 18, blood pressure 136/80, with saturations of 93%  on 2 liters nasal cannula.  GENERAL:  The patient is a male who looks his stated age, lying flat in  bed in no acute distress.  HEENT:  Normocephalic, atraumatic.  Pupils are equal, round and react  light, with the extraocular muscles being intact.  Oropharynx reveals no  posterior pharyngeal lesions.  NECK:  Supple with no lymphadenopathy or thyromegaly.  No carotid  bruits.  Jugular venous distention is in the range of 6 cmH2O.  LUNGS:  Clear to auscultation bilaterally.  CARDIOVASCULAR EXAM:  Regular rhythm and rate with no murmurs, rubs or  gallops.  Normal S1, S2.  No S3 or S4.  ABDOMEN:  Soft, nontender, nondistended, with normoactive bowel sounds.  EXTREMITIES:  Show no clubbing, cyanosis or edema.  Dorsalis pedis and  posterior tibials are 1+ bilaterally.   LABORATORY DATA:  From blood work blood drawn earlier in the day,  troponin 1755 October 12, 2008, 0.05, CK-MB was 2.8, index was 2.6, as his  CK was 109.  His sodium is 136 with a potassium of 3.8 chloride 103,  bicarb 28, BUN 13, creatinine 1.02, glucose 126.  Otherwise, albumin was  3.3, otherwise normal LFTs.  PTT 27, PT 13.4, INR 1.0.  The patient's  CBC:  White count is 7.8 with hemoglobin of 13.2, with segs of 69%.  The  patient's EKG on October 13, 2347, he had lead reversal, arm leads.  Sinus  rhythm was 60 beats per minute with first-degree AV block, QRS of 114.  He had signs of an inferior infarct.  Later at 4 minutes after midnight  October 13, 2008, he was sinus rhythm at 61 beats per minute, first-degree  AV block, inferior infarct, QT corrected 417, QRS is 110.  Chest x-ray  showed no acute cardiopulmonary disease.   ASSESSMENT AND PLAN:  This is a patient with a history of ventricular  tachycardia, he also has a history of atrial flutter, who now presents  with ICD discharges, at least 3 units within the last 24 hours, and at  this time he  will be admitted under observation for beta blockade, short  and rapid-acting.  We will rule out MI with cardiac markers x3.  We  will  check his magnesium and TSH.  GI and DVT prophylaxis will be ordered  with further course of care per the cardiology team in the morning.      Darryl D. Prime, MD  Electronically Signed     DDP/MEDQ  D:  10/13/2008  T:  10/13/2008  Job:  [id number]

## 2010-09-18 NOTE — Assessment & Plan Note (Signed)
OFFICE VISIT   Nathan Pruitt, Nathan Pruitt  DOB:  Aug 13, 1945                                       04/20/2009  CHART#:[id number]   I saw the patient in the office for continued followup of his left  groin.  This is a 65 year old gentleman who had undergone a left to  right fem-fem bypass graft elsewhere.  This occluded and I took him to  the OR on 12/30/2007 and did vein patch angioplasty of the left external  iliac artery with a redo left to right fem-fem bypass graft with an 8 mm  Dacron graft.  He had developed some bloody drainage from the left groin  which he had noted.  He was seen at the [facility] emergency room where  cultures were obtained and were reportedly negative.  He had a CT scan  done there and we have reviewed this study which shows no evidence of  pseudoaneurysm or fluid around the graft.  In addition, we repeated a  duplex of his graft which showed no fluid around the graft.  Dr. Hart Rochester  sent him back for continued followup.   Since he was seen on 04/17/2009 he has had no pain in the groin and no  drainage or fever.   REVIEW OF SYSTEMS:  He has had no chest pain, chest pressure,  palpitations or arrhythmias.   PHYSICAL EXAMINATION:  General:  This a pleasant 65 year old gentleman  who appears his stated age.  Vital signs:  Blood pressure is 106/69,  heart rate is 60, respiratory rate 18.  Lungs:  Are clear bilaterally to  auscultation.  He has palpable femoral pulses and a palpable fem-fem  graft pulse.  Both feet are warm and well-perfused without ischemic  ulcers.  He has no significant lower extremity swelling.  In the left  groin currently there is no erythema and no drainage.  There is one  small area where he apparently had a small scab which is slightly  indurated but appears to be healed.   Most likely I think he probably had a superficial infection which has  cleared.  Based on his CT scan and ultrasound there was no evidence of  graft infection.  We will have him come back in 3 months and do a  followup duplex of the graft.  He knows to call sooner if he has any  fever, chills or recurrent drainage from the groin.   Nathan Pruitt. Nathan Pruitt, M.D.  Electronically Signed   CSD/MEDQ  D:  04/20/2009  T:  04/21/2009  Job:  [id number]

## 2010-09-18 NOTE — Discharge Summary (Signed)
NAME:  Nathan Pruitt, Nathan Pruitt NO.:  [id number]   MEDICAL RECORD NO.:  [id number]          PATIENT TYPE:  INP   LOCATION:  3301                         FACILITY:  [facility]   PHYSICIAN:  Doylene Canning. Ladona Ridgel, MD    DATE OF BIRTH:  [birth year]   DATE OF ADMISSION:  10/13/2008  DATE OF DISCHARGE:  10/18/2008                               DISCHARGE SUMMARY   This patient has no known drug allergies, admitted from October 13, 2008 to  October 18, 2008.   TIME FOR THIS DICTATION AND EXPLANATION TO THE PATIENT:  Greater than 50  minutes.   FINAL DIAGNOSES:  1. Admitted with implantable cardioverter-defibrillator      antitachycardia pacing and shocks.  2. This is a purely arrhythmogenic ventricular tachycardia, not      derivative from active ischemia.  3. Ventricular tachycardia, recurrent despite sotalol 160 mg twice      daily.  4. Ventricular tachycardia suppressed after amiodarone loaded.   PAST MEDICAL HISTORY:  1. Recent admission Sep 20, 2008 to Sep 23, 2008 for palpitations and      chest pain.      a.     VT - demand ischemia.      b.     Atrial flutter - status post cavotricuspid isthmus ablation,       Sep 22, 2008.      c.     Electrophysiology study also found was right bundle-branch       block superior axis VT, easily inducible and pace terminated.      d.     Medtronic Virtuoso dual-chamber ICD implanted, Sep 22, 2008.      e.     Catheterization on Sep 20, 2008, right coronary artery       chronically occluded, strong bridging collaterals from left-to-       right ejection fraction 35-45%.  2. 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  This was in 1993.  3. Myoview study in 2009 reportedly normal.  4. Hypertension.  5. Dyslipidemia.  6. Infrainguinal arterial occlusive disease, history of claudication.      a.     Left-to-right femoral-to-femoral bypass in [place], March       2009.      b.     Redo left-to-right  femoral-to-femoral bypass, Dr.       Waverly Ferrari.  7. Rheumatoid arthritis.  8. Status post mastectomy, status post tonsillectomy.   PROCEDURES THIS ADMISSION:  Ventricular tachycardia suppression first  tried with sotalol when there was recurrence on sotalol.  The patient  was started on amiodarone therapy for suppression and the patient is  quiescent at the time of discharge.   BRIEF HISTORY:  Mr. Bail is a 65 year old male.  He has a history of  myocardial infarction in the past in 1993.  He was hospitalized on Sep 20, 2008 and had a catheterization the same day, which showed a moderate  reduction in left ventricular function, ejection fraction 35-40%.  It  also showed occlusion of the right coronary artery.  It had local, but  chronic occlusion.  There were bridging collaterals, left to right.  There were scattered irregularities in the left coronary system.  The  patient has a history of hypertension and dyslipidemia.  He has a  history of significant peripheral vascular disease.  He has undergone  two left-to-right femoral-to-femoral bypass procedures.   The patient at the May 2010 hospitalization also had placement of a dual-  chamber cardioverter-defibrillator.  He had been doing well up until  October 12, 2008.  About 3 o'clock, he notes having an ICD discharge.  He  was seen in the emergency room and apparently had a fast heart rhythm.  His Coreg was increased from 12.5 mg to 25 mg twice a day and was  discharged home about 10:30 that evening.  He had recurrence of ICD's  discharge and was brought to [facility] by emergency medical  services.  The patient denies any associated chest pain or  lightheadedness.  He denies lower extremity edema, paroxysmal nocturnal  dyspnea, or orthopnea.  He is not having dysuria.  He is not having  hematuria.  He lives alone.  He was given metoprolol 25 mg for his heart  rate and currently has a heart rate of 60 at time of  this dictation.   The patient has a history of ventricular tachycardia and atrial flutter.  He now presents with ICD discharges at least 3 within the last 24 hours.  He will be admitted for observation, beta-blockade, he will have his  device interrogated.  We will rule out myocardial infarction with  cardiac markers.  Troponin I studies were 0.05 x4.  The patient was not  particularly complaining of chest pain aside from the discomfort of  having repetitive shocks.   HOSPITAL COURSE:  The patient admitted on the evening of October 12, 2008  with a repetitive cardioverter defibrillator shocks.  The first thought  would be to suppress with IV amiodarone; however, it was noted that the  patient had severe reactions to amiodarone given through peripheral  access.  It was thought that he would be started on sotalol instead.  The device was interrogated and did show that the patient did have a  ventricular tachycardia with failed antitachycardia pacing several times  and then shocks delivered appropriate for the dysrhythmia.  Early on,  the patient showed that despite sotalol 160 mg twice daily, he had  recurrent ventricular tachycardia.  On June 11, the patient was started  on amiodarone 100 mg orally every 8 hours for an effective amiodarone  load.  He has been watched carefully on telemetry and his ventricular  tachycardia has been quiescent with only minor breakthroughs in NSVT  over the next 72 hours.  The patient discharging October 17, 2008 without  further breakthrough VT in a 24-hour period.   DISCHARGE MEDICATIONS:  1. Amiodarone 200 mg tablets two tablets in the morning, two tablets      in the evening for the next 2 weeks from October 18, 2008 to November 01, 2008, then he will start amiodarone 200 mg one tablet in the      morning, one tablet in the evening on November 02, 2008, Wednesday.  2. A new dose of Coreg 6.25 mg twice daily.  3. Enteric-coated aspirin 325 mg daily.  4. Vytorin  10/80 daily at bedtime.  5. Diovan 40 mg daily.  6. Lasix 20 mg daily.  7. Leflunomide 20 mg daily.  8. Vitamin D 1000 units two tablets daily.   He follows up with Dr. Johney Frame, [facility], [address], Thursday, November 03, 2008 at 3:30.  On the day of discharge, his  basic metabolic panel; sodium is 136, potassium 4.3, chloride 102,  carbonate 30, glucose 109, BUN is 17, creatinine 1.17.  CBC on October 14, 2008; white cells 8.3, hemoglobin 12.3, hematocrit 36.4, platelets are  154.  TSH this admission was 3.417, magnesium 2.3, phosphorus 2.6,  calcium 8.7.      Maple Mirza, PA      Doylene Canning. Ladona Ridgel, MD  Electronically Signed    GM/MEDQ  D:  10/18/2008  T:  10/19/2008  Job:  [id number]   cc:   Dr. Estill Cotta, MD

## 2010-09-18 NOTE — Assessment & Plan Note (Signed)
OFFICE VISIT   Hinostroza, EIAN  DOB:  1945-11-12                                       04/17/2009  CHART#:[id number]   Patient is a 65 year old male well-known to our practice, having  undergone most recently a left-to-right femoral-femoral bypass graft by  Dr. Edilia Bo (re-do) with vein patch angioplasty of the left external  iliac artery.  The patient noted some bloody drainage 3 days ago on his  shorts and was seen at the [facility] Emergency Room, where cultures were  obtained of a scant amount of drainage, and a CT scan was obtained,  which I have reviewed today.  The disk was sent with the patient today  and revealed no evidence of any pseudoaneurysm or fluid surrounding this  graft, which is a widely patent graft, and no problems in the intra-  abdominal area.  Patient was discharged on Bactrim and Augmentin.  The  following day was told by the emergency department that the ulcers were  negative at 24 hours, and he was advised to come to our office.  He has  had no chills, fever, purulent drainage, or previous episodes of  bleeding.  He did notice a slight amount of bloody drainage today after  taking a shower but otherwise has had no significant bleeding since 3  days ago.   PAST MEDICAL HISTORY:  1. Type 1 hypertension.  2. Hyperlipidemia.  3. Chronic atrial fibrillation with defibrillator implant.   SOCIAL HISTORY:  The patient is single, is retired.  Has not smoked  since August 2009.  Does not use alcohol.   REVIEW OF SYSTEMS:  Denies any chest pain, dyspnea on exertion, PND,  orthopnea, no hemoptysis, bronchitis, wheezing.  All other systems are  negative in the review of systems.   ALLERGIES:  None known.   PHYSICAL EXAMINATION:  Blood pressure 110/60, heart rate is 59,  respirations 20.  GENERAL:  He is a well-developed, well-nourished male  who is in no apparent distress, alert, oriented x3.  Neck is supple, 3+  carotid pulses  palpable.  HEENT exam is unremarkable.  EOMs are intact.  Neurologic exam is normal.  CHEST:  Clear to auscultation.  CARDIOVASCULAR:  Regular rhythm with no murmurs.  ABDOMEN:  Soft,  nontender with no masses.  Musculoskeletal exam reveals no deformities  in the joints.  SKIN:  No skin rashes are noted.  Extremity exam reveals  3+ femoral pulses bilaterally with well-perfused lower extremities.  There is 1 small opening in the left inguinal area in the midst of the  previous scar, measuring about 3 mm in size.  I compressed this  extensively, was unable to express any bloody drainage or purulent  drainage from this.  There is no cellulitis surrounding it.  The femoral-  femoral graft has an excellent pulse with no inflammation over the graft  or in the right inguinal area.   Duplex scan in our office was ordered by me and interpreted and reveals  no fluid around the graft and no evidence of pseudoaneurysm.   IMPRESSION:  History of scant bloody drainage in the left inguinal area  3 days ago.  Discussed with the patient at length the possibility of a  graft infection.  He has been treated with oral antibiotics, at this  point has no cellulitis, and does not want to be  treated in the hospital  with IV antibiotics at this time.  He does realize that this may lead to  removal of the graft, if in fact it is infected.  He will monitor this  closely at home, and if he becomes febrile, has any bloody drainage or  purulent drainage or develops any redness around the wound, he will be  in touch with [place] but otherwise, he will return in 3 days to see Dr.  Edilia Bo for continued follow-up.   Quita Skye Hart Rochester, M.D.  Electronically Signed   JDL/MEDQ  D:  04/17/2009  T:  04/18/2009  Job:  [id number]

## 2010-09-18 NOTE — Discharge Summary (Signed)
NAME:  Nathan Pruitt, Nathan Pruitt NO.:  [id number]   MEDICAL RECORD NO.:  [id number]          PATIENT TYPE:  INP   LOCATION:  2922                         FACILITY:  [facility]   PHYSICIAN:  Marca Ancona, MD      DATE OF BIRTH:  May 15, 1945   DATE OF ADMISSION:  09/20/2008  DATE OF DISCHARGE:  09/23/2008                               DISCHARGE SUMMARY   ADDITIONAL ATTENDING PHYSICIAN:  Hillis Range, MD   Time for this dictation and examination, explanation to the patient,  greater than 50 minutes.   FINAL DIAGNOSES:  1. The patient had an evidence of an old inferior myocardial      infarction.  2. Admitted with recurrent episodes over a 3-day period of tachy      palpitations/chest pain radiating to the jaw/weakness and      diaphoresis.  3. Elevated troponin I studies (zenith 2.76).  4. Ventricular tachycardia/producing demand ischemia this admission.  5. Dysrhythmias:  Atrial flutter and a wide-complex tachycardia,      presumed to be ventricular tachycardia in the setting of increased      troponin I studies and prior history of inferior myocardial      infarction/SCAR.  6. Left heart catheterization on Sep 20, 2008, severe disease in the      right coronary artery, which is chronically occluded.  Proximally,      it is diffusely disease 80-90%, then totally occluded at the      midpoint.  There are bridging collaterals to the distal right      coronary artery and the posterior descending.  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  There is      minimal disease in the left anterior descending of 30% midpoint.      First diagonal had a 40% ostial stenosis, left circumflex had a 30%      stenosis at the atrioventricular groove.  7. Intravenous amiodarone administered over a 72-hour period      effectively suppressing both atrial arrhythmia and ventricular      tachycardia.  8. Electrophysiology study, Sep 22, 2008.      a.     No  inducible atrial flutter (amiodarone suppression)/empiric       cavotricuspid isthmus ablation with bidirectional isthmus block.      b.     Right bundle branch superior axis ventricular tachycardia       easily induced and pace terminated.      c.     Medtronic Virtuoso dual-chamber cardioverter-defibrillator       implanted on Sep 22, 2008.  Defibrillator threshold study less       than or equal to 10 joules.      d.     Coreg going forward/no amiodarone at this time.  9. Effective suppression of both atrial flutter and ventricular      tachycardia on amiodarone.   SECONDARY DIAGNOSES:  1. A 2-D echocardiogram in March 2009, ejection fraction 40%.  2. Myoview study in March 2009, reportedly normal.  3. Hypertension.  4.  Dyslipidemia.  5. Remote tobacco habituation having quit in 2009 after 47 years.  6. Infrainguinal arterial occlusive disease.      a.     Left-to-right femoral-to-femoral bypass, [place], March       2009.      b.     Redo left-to-right femoral-to-femoral bypass, Dr. Edilia Bo,       [facility].  7. Rheumatoid arthritis.  8. Status post vasectomy.  9. Status post tonsillectomy.   PROCEDURES:  1. Left heart catheterization, Sep 20, 2008, ejection fraction 35-45%      severe disease in the right coronary artery with a total occlusion      at the midpoint, which is chronic.  There are bridging collaterals      to the distal right coronary artery into the posterior descending      artery.  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  2. Sep 22, 2008, he had an electrophysiology study, atrial flutter      noninducible, but a cavotricuspid isthmus ablation provided      empirically with bidirectional isthmus block.  There was a right      bundle-branch superior axis, ventricular tachycardia easily induced      and pace terminated.  The patient will be on Coreg going forward,      no amiodarone at this time.   BRIEF HISTORY:  Mr. Kauffman is a 65 year old Caucasian male.  He  woke  in the morning on Saturday, Sep 17, 2008, with tachy palpitations and a  knotting in the center of his chest.  He had some radiation to the jaw.  He felt weak and diaphoretic, as well as mildly dyspneic.  The remainder  of Saturday and all day Sunday, he felt well, then on Monday afternoon,  Sep 19, 2008, and at about noon, he had recurrence of tachy palpitations  and once again the sensation of a knot in the center of his chest  associated with dyspnea and diaphoresis.  This persisted about 8-1/2  hours on Monday.  He was able to fall asleep later in the day.  He woke  at 1:30 on the morning of Tuesday.  He was markedly dyspneic, could not  get a breath.  He also had that knot-like sensation and fluttering.  He  went back to bed but then woke up again at 3:30, he called EMS about  5:30 in the morning.  Upon arrival at the emergency room, he had a wide-  complex tachycardia.  He was placed on aspirin.  The tachycardia slowed  to an atrial flutter.  This was in the [facility] Emergency Room,  and he had multiple recurrent episodes of tachy palpitations, associated  with flushing.  Monitor showed a wide-complex tachycardia, mostly  consistent with ventricular tachycardia.  The patient was started on IV  amiodarone, given heparin and aspirin.  He was seen there by Dr.  Tomie China.  At the time of transfer to [facility], he was pain  free.   HOSPITAL COURSE:  The patient was transferred to [facility]  from [facility] on Sep 20, 2008, after 3-day history of recurrent  tachy palpitations and chest discomfort.  His troponin I studies were  elevated.  He had left heart catheterization the same day, Sep 20, 2008.  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  His dysrhythmias,  atrial flutter, and VT were thought to be primarily driving his symptoms  and that his chest discomfort and  elevated troponins were due to demand  ischemia.  He was seen in consultation by electrophysiology with a  suggestion that he would have an electrophysiology study and  implantation of a cardioverter-defibrillator if VT was inducible.  At  electrophysiology study, atrial flutter was not inducible; however, a  cavotricuspid isthmus block was provided.  He did have inducible VT,  which was pace terminated.  The patient then had implantation of the  Medtronic dual-chamber Virtuoso cardioverter-defibrillator.  Medication  changes including stopping amiodarone but continue on Coreg.  He is  ready for discharge on postprocedure day #1.  The device has been  interrogated, all values are within normal limits.  Chest x-ray has been  reviewed.  The lungs are clear without pneumothorax.  Leads are in  appropriate position.  The patient feels well and is ready for  discharge.  There was no pocket hematoma.  Incision care and mobility of  the left arm have been discussed with the patient.  He is asked to keep  his incision dry for the next 7 days, to sponge bathe until Thursday,  Sep 29, 2008.   MEDICATIONS AT DISCHARGE:  1. Enteric-coated aspirin 325 mg daily.  2. Simvastatin 80 mg daily at bedtime.  3. Zetia 10 mg daily.  Keep those separate.  He also has a prescription for Vytorin 10/80  daily.  He knows that he is not to take Vytorin and Zetia and  simvastatin together.  1. Divan 80 mg tablets one-half tablet daily.  This is a new dose,      reduced.  2. Coreg 12.5 mg twice daily, a new medication.  3. Leflunomide 20 mg daily.  4. Lasix 20 mg daily.  This is a new medication, and a basic metabolic      panel will be checked for potassium when he comes to the office in      2 weeks.  The patient was taking folic acid but is no longer doing      that.   He follows up at [facility] [address],  [place], [place] [facility] and to see Dr. Shirlee Latch the same day, Tuesday,  October 11, 2008, at 10:30 and he will see Dr. Johney Frame on Friday, December 30, 2008, at 10 o'clock.   LABORATORY STUDIES PERTINENT TO THIS ADMISSION:  Zenith troponin I  studies 2.76.  TSH is 3.453.  Basic metabolic panel on day of discharge,  sodium 139, potassium 4.2, chloride 106, carbonate 25, glucose 131, BUN  is 15, creatinine 0.97.  Complete blood count on day of discharge, white  cells 8.8, hemoglobin 13.3, hematocrit 38.6, platelets of 193.  He had a  lipid profile drawn:  total cholesterol 174, LDL cholesterol 122, HDL  cholesterol 25, and triglycerides 135.  Pro time this admission is 13.7,  INR is 1.0.      Maple Mirza, PA      Marca Ancona, MD  Electronically Signed    GM/MEDQ  D:  09/23/2008  T:  09/24/2008  Job:  [id number]   cc:   Dr. Oswald Hillock. Revankar, M.D.

## 2010-09-18 NOTE — Procedures (Signed)
BYPASS GRAFT EVALUATION   INDICATION:  Follow up left-to-right fem-fem bypass graft.   HISTORY:  Diabetes:  No.  Cardiac:  Yes.  Hypertension:  Yes.  Smoking:  Quit about 6 months ago.  Previous Surgery:  Please see above.   SINGLE LEVEL ARTERIAL EXAM                               RIGHT              LEFT  Brachial:                    138                150  Anterior tibial:             133                130  Posterior tibial:            130                143  Peroneal:  Ankle/brachial index:        0.89               0.95   PREVIOUS ABI:  Date: 01/19/08  RIGHT:  >1.0  LEFT:  >1.0   LOWER EXTREMITY BYPASS GRAFT DUPLEX EXAM:   DUPLEX:  Patent left-to-right fem-fem bypass graft with no evidence of  focal stenosis.   IMPRESSION:  1. Patent left-to-right femoral-femoral bypass graft with no evidence      of focal stenosis.  2. Mild abnormal ankle brachial index with biphasic Doppler waveform      noted in the right leg.  3. Normal ankle brachial index with biphasic Doppler waveform noted in      the left legs.  4. Status post left-to-right femoral-femoral bypass graft on 12/30/07.       ___________________________________________  [name]. Edilia Bo, M.D.   MG/MEDQ  D:  06/21/2008  T:  06/21/2008  Job:  [id number]

## 2010-09-18 NOTE — Discharge Summary (Signed)
NAME:  Nathan Pruitt, Nathan Pruitt NO.:  [id number]   MEDICAL RECORD NO.:  [id number]          PATIENT TYPE:  INP   LOCATION:  2027                         FACILITY:  [facility]   PHYSICIAN:  [name]. Edilia Bo, M.D.DATE OF BIRTH:  May 22, 1945   DATE OF ADMISSION:  12/30/2007  DATE OF DISCHARGE:  01/01/2008                               DISCHARGE SUMMARY   ADMISSION DIAGNOSIS:  Femorofemoral bypass graft with right lower  extremity claudication.   DISCHARGE/SECONDARY DIAGNOSIS:  1. Femorofemoral bypass graft with right lower extremity claudication,      status post redo femorofemoral bypass.  2. Hypercholesterolemia.  3. Coronary artery disease with history of silent myocardial      infarction in 1993.  4. History of congestive heart failure.  5. Prior femorofemoral bypass graft, approximately 1 year ago.  6. Ongoing tobacco abuse.   ALLERGIES:  No known drug allergies.   PROCEDURES:  On December 30, 2007, vein patch angioplasty of the left  external iliac artery and redo left to right femorofemoral bypass  grafting using 8-mm Dacron graft by Dr. Waverly Ferrari.   BRIEF HISTORY:  Mr. Fina is a 65 year old male who underwent left to  right femorofemoral bypass grafting with 6-mm PTFE graft in April 2009  at an outlying institution.  The graft has since occluded, and he was  sent to Dr. Waverly Ferrari for a vascular surgery consultation.  He  underwent an arteriogram suggesting mild diffuse iliac disease but  really no focal stenosis.  Subsequent CT scan again confirmed some  calcific disease in the iliac system but no focal stenosis.  There was  felt to be adequate inflow.  Therefore, Dr. Edilia Bo has recommended  undergoing redo of his femorofemoral bypass graft using a larger graft.   HOSPITAL COURSE:  Mr. Salguero was electively admitted to [facility] on December 30, 2007.  He underwent the previously mentioned  procedure.  On postoperative day 1,  he had palpable posterior tibial  pulses.  Dressings were dry and intact.  His vitals signs were also  stable as well as his morning labs, which showed a sodium of 138,  potassium 3.9, glucose 113, creatinine at 0.93, BUN of 7.  White count  of 9.5, hemoglobin is 12.3, hematocrit 35, and platelet count was 208.  Mr. Ingalls did begin to mobilize, diet was advanced, and Foley  discontinued.  It was felt that if he makes good progress with mobility,  he will be discharged home on postoperative day 2, January 01, 2008.  Currently, he remains in a stable condition.  Postoperative ABIs are  pending at the time of this dictation.   DISCHARGE MEDICATIONS:  1. Aspirin 325 mg p.o. daily.  2. Simvastatin 80 mg p.o. daily.  3. Prednisone 5 mg daily.  4. Folic acid 1 mg daily.  5. Vitamin D 50,000 International Units weekly on Wednesdays.  6. Chantix 1 mg b.i.d.  7. Diovan 80/12.5 mg daily.  8. Tylox 1-2 tablets p.o. q.4 h. p.r.n. pain.   DISCHARGE INSTRUCTIONS:  Continue heart-healthy diet, clean his incision  gently with soap and water, may shower.  He should avoid driving and  heavy lifting for the next couple of weeks.  He will see Dr. Edilia Bo at  the [facility] office in approximately 3 weeks with re-evaluation of his ankle-  brachial indices.  He should call sooner if he develops fever grater  than 101, redness or drainage from his incision site, or increased pain.      Jerold Coombe, P.A.      [name]. Edilia Bo, M.D.  Electronically Signed    AWZ/MEDQ  D:  12/31/2007  T:  01/01/2008  Job:  [id number]   cc:   Karna Christmas. Yetta Flock, MD  Baldo Daub, MD

## 2010-09-18 NOTE — Consult Note (Signed)
NEW PATIENT CONSULTATION   Nathan Pruitt, Nathan Pruitt  DOB:  1945-07-26                                       12/08/2007  CHART#:[id number]   I saw the patient in the office today in consultation concerning his  right leg pain.  This is a pleasant 65 year old gentleman who had been  having claudication in the right calf, thigh, and hip which had been  going on for approximately 1 year.  His symptoms have gradually  progressed and ultimately he underwent an arteriogram in February of  this year which showed a long segment occlusion of the right common  iliac and external iliac artery with reconstitution of the common  femoral artery on the right.  On the left side, he has a patent iliac  system with no significant stenosis identified, just some mild  irregularity.  He underwent a left to right fem-fem bypass graft with a  6-mm PTFE graft in April of this year.  On initial followup exam, he was  noted to have a palpable posterior tibial pulse.  The patient states  that he did not notice any significant change in his symptoms although  he had not been walking much for the initial 3 to 4 weeks after his  surgery.  He had persistent pain in the right leg and on followup  examination he was noted to have lost the pulse on the right side.  Duplex scan confirmed occlusion of his fem-fem bypass graft.  He was  sent for consideration for aortofemoral bypass grafting.  Of note, the  patient denies any history of rest pain or history of nonhealing ulcers.  He has had some paresthesias in his toes of the right foot.   PAST MEDICAL HISTORY:  Significant for hypercholesterolemia and coronary  artery disease.  He had apparently a silent MI in 1993.  He also has a  history of congestive heart failure.  He denies any history of  emphysema.  Prior to undergoing his fem-fem bypass, he was evaluated by  Dr. Dulce Sellar of [facility] and he had a myocardial perfusion  study that showed no  ischemia and ejection fraction of 22%, although  subsequent echo showed ejection fraction of 40%.   FAMILY HISTORY:  He is unaware of any history of premature  cardiovascular disease.   SOCIAL HISTORY:  He is single.  Does not have any children.  He is  retired.  He smokes 1/2 to 3/4 of a pack per day of cigarettes but had  been smoking up to a pack per day for many years.  He does not use  alcohol on a regular basis.   REVIEW OF SYSTEMS AND MEDICATIONS:  Documented on the medical history  form in his chart.   PHYSICAL EXAMINATION:  This is a pleasant 65 year old gentleman who  appears his stated age.  Blood pressure is 140/92, heart rate is 69.  Neck is supple.  There is no cervical lymphadenopathy.  I do not detect  any carotid bruits.  Lungs are clear bilaterally to auscultation.  Cardiac exam, he has a regular rate and rhythm.  Abdomen:  Soft and  nontender.  He has a palpable left femoral pulse with no right femoral  pulse.  He has a palpable popliteal and dorsalis pedis pulse on the  left.  I cannot palpate the popliteal or pedal pulses  on the right.  Both feet appear adequately perfused without ischemic ulcers.  He has no  significant lower extremity swelling.   I did review his arteriogram from February, which does show a long  segment occlusion of the right common iliac and external iliac artery.  Reconstitution of the common femoral artery just above the bifurcation  with 3-vessel runoff on the right proximal, although it is difficult to  visualize distally because of the proximal iliac occlusion.  He just has  some mild irregularity throughout the iliac system on the left but no  real focal stenosis.  Again this was back in February.   SUMMARY:  This is a 65 year old gentleman who continues to smoke and  presents with claudication in the right lower extremity and an occluded  fem-fem bypass graft.  I have explained that I agree with Dr.  Cathleen Fears decision that the  fem-fem bypass is the most appropriate  operation and given his anatomy.  He feels that he did not notice a big  difference in his symptoms after the operation and the graft is now  occluded.  I have explained that one option would be to simply try to  get on a structured walking program and try to get off cigarettes.  He  has extensive collaterals based on his previous arteriogram and I think  that certainly his symptoms could gradually improve without operative  treatment.  We have also discussed the potential use of cilostazol.  However, he feels that honestly it is unlikely that he will be able to  quit smoking and he is unable really to do any significant walking  because of his leg pain.  I think that the second option would be to  redo his fem-fem bypass graft using a larger graft.  The 6 mm graft may  have limited the inflow to the right leg, which may potentially be more  his symptoms do not seem changed significantly after surgery.  There is  also the chance that the early graft thrombosis is related to the  smaller graft.  Based on his arteriogram in February, it does not appear  that inflow or outflow was the problem.  I have explained that if we  were to repeat his fem-fem graft, I would like to obtain a CT angio to  be sure there has been no significant change in his iliac disease on the  left, although currently he has a reasonable left femoral pulse.  Another alternative would be axillofemoral bypass grafting, although I  do not see any major advantage this over a redo fem-fem bypass grafting.  I have explained the patency with the fem-fem or ax-fem graft is 70% at  5 years.  The final alternative would be aortofemoral bypass grafting,  although this currently does not appear to be necessary given that the  left iliac system appears to be widely patent with good femoral pulse on  the left.  I agree he was not a good candidate for endovascular approach  on the right given  the total occlusion of the common and external iliac  artery.  He would prefer to have the CT angiogram done in [place] and  we will try to arrange this.  We tentatively have him scheduled for redo  fem-fem bypass graft on August 26.  We have discussed the indications  for the procedure and the potential complications including but not  limited to wound healing problems, graft infection, graft thrombosis.  I  have explained  there is no guarantee that I will have any better luck  than Dr. Irving Burton but that, in order to try to do something  differently, I would use a larger graft.  Again, I have emphasized the  importance of considering a nonoperative approach.  However, he is very  much opposed to this at this point.   [name]. Edilia Bo, M.D.  Electronically Signed   CSD/MEDQ  D:  12/08/2007  T:  12/09/2007  Job:  [id number]   cc:   Angeline Slim, M.D.

## 2010-09-18 NOTE — Assessment & Plan Note (Signed)
OFFICE VISIT   Koike, Nathan Pruitt  DOB:  December 04, 1945                                       06/21/2008  CHART#:[id number]   I saw the patient for routine followup visit.  He had a vein patch  angioplasty of the left external iliac artery and a redo left to right  fem-fem bypass graft with an 8 mm Dacron graft in August of 2009.  He  had had a previous fem-fem bypass graft at an outlying institution and  this graft occluded fairly early.   Since I saw him last he has had no history of claudication, rest pain or  nonhealing ulcers.  There has been no significant change in his medical  history except that he did quit smoking 6 months ago.   REVIEW OF SYSTEMS:  On review of systems he has had no recent chest  pain, chest pressure, palpitations or arrhythmias.  He has had no  productive cough, bronchitis, asthma, wheezing.   PHYSICAL EXAMINATION:  General:  This is a pleasant 65 year old  gentleman who appears his stated age.  Vital signs:  His blood pressure  is 123/81, heart rate is 63.  Neck:  Is supple.  There is no cervical  lymphadenopathy.  I do not detect any carotid bruits.  Lungs:  Are clear  bilaterally to auscultation.  Cardiac:  He has a regular rate and  rhythm.  Abdomen:  Soft and nontender.  He has palpable femoral pulses  and a palpable fem-fem graft pulse.  He has a palpable posterior tibial  pulse bilaterally.  He has biphasic Doppler signals in the dorsalis  pedis and posterior tibial positions bilaterally.   Doppler study in our office today shows an ABI of 89% on the right and  95% on the left.   Overall I am pleased with his progress and his graft appears to be  functioning well.  We will see him back p.r.n.  I have explained that he  should receive prophylactic antibiotics for any invasive procedures  given that he has a prosthetic graft.   [name]. Edilia Bo, M.D.  Electronically Signed   CSD/MEDQ  D:  06/21/2008  T:   06/22/2008  Job:  [id number]

## 2010-09-18 NOTE — Procedures (Signed)
VASCULAR LAB EXAM   INDICATION:  History of fem-fem bypass graft with recent bleeding and  pain.   HISTORY:  Diabetes:  No  Cardiac:  Yes  Hypertension:  Yes   EXAM:  Duplex of fem-fem bypass graft.   IMPRESSION:  Patent fem-fem bypass graft with no evidence of perigraft  fluid and also no aneurysm.   ___________________________________________  Quita Skye Hart [place], M.D.   MG/MEDQ  D:  04/17/2009  T:  04/18/2009  Job:  [id number]

## 2010-09-18 NOTE — Assessment & Plan Note (Signed)
OFFICE VISIT   [name], Nathan Pruitt  DOB:  12-29-1945                                       01/19/2008  CHART#:[id number]   I saw the patient for followup after his recent redo fem-fem bypass  graft.  This is a pleasant 65 year old gentleman who had undergone a  left to right fem-fem bypass graft with a 6 mm PTFE graft in April of  2009 at another institution.  He presented with a clotted graft and it  was not clear how long this had been occluded.  He underwent vein patch  angioplasty of the left external iliac artery and a redo left to right  fem-fem bypass with an 8 mm Dacron graft on 12/30/2007.  He did well  postoperatively with palpable pedal pulses.  He returns for his first  postoperative outpatient visit.  He has been ambulating without  difficulty and has no specific complaints.   PHYSICAL EXAMINATION:  On examination blood pressure is 146/82.  His  incisions are healing nicely.  He has a palpable fem-fem graft pulse and  palpable posterior tibial pulses bilaterally.  ABIs are 100%  bilaterally.  Overall I am pleased.   [name]. Edilia Bo, M.D.  Electronically Signed   CSD/MEDQ  D:  01/19/2008  T:  01/21/2008  Job:  [id number]

## 2010-11-21 ENCOUNTER — Encounter: Payer: Self-pay | Admitting: Cardiology

## 2010-11-23 ENCOUNTER — Encounter: Payer: Self-pay | Admitting: Cardiology

## 2010-11-27 ENCOUNTER — Ambulatory Visit (INDEPENDENT_AMBULATORY_CARE_PROVIDER_SITE_OTHER): Payer: Medicare Other | Admitting: Cardiology

## 2010-11-27 ENCOUNTER — Encounter: Payer: Self-pay | Admitting: Cardiology

## 2010-11-27 DIAGNOSIS — I4729 Other ventricular tachycardia: Secondary | ICD-10-CM

## 2010-11-27 DIAGNOSIS — E785 Hyperlipidemia, unspecified: Secondary | ICD-10-CM

## 2010-11-27 DIAGNOSIS — R0989 Other specified symptoms and signs involving the circulatory and respiratory systems: Secondary | ICD-10-CM

## 2010-11-27 DIAGNOSIS — R0609 Other forms of dyspnea: Secondary | ICD-10-CM

## 2010-11-27 DIAGNOSIS — I472 Ventricular tachycardia: Secondary | ICD-10-CM

## 2010-11-27 DIAGNOSIS — I2589 Other forms of chronic ischemic heart disease: Secondary | ICD-10-CM

## 2010-11-27 DIAGNOSIS — I2510 Atherosclerotic heart disease of native coronary artery without angina pectoris: Secondary | ICD-10-CM

## 2010-11-27 DIAGNOSIS — R0602 Shortness of breath: Secondary | ICD-10-CM

## 2010-11-27 LAB — BASIC METABOLIC PANEL
Calcium: 8.6 mg/dL (ref 8.4–10.5)
GFR: 60.87 mL/min (ref >60.00)
Glucose, Bld: 99 mg/dL (ref 70–99)
Sodium: 138 mEq/L (ref 135–145)

## 2010-11-27 LAB — TSH: TSH: 2.69 u[IU]/mL (ref 0.35–5.50)

## 2010-11-27 LAB — BRAIN NATRIURETIC PEPTIDE: Pro B Natriuretic peptide (BNP): 116 pg/mL — ABNORMAL HIGH (ref 0.0–100.0)

## 2010-11-27 NOTE — Assessment & Plan Note (Signed)
Has ICD.  No tachypalpitations or ICD discharges.  Continue regular followup.  He is on amiodarone to suppress VT.  LFTs were normal in 7/12.  Will check TSH today.  PFTs were stable in 2/12, follow yearly given mild abnormalities likely due to COPD.  He has been getting yearly eye exams.

## 2010-11-27 NOTE — Assessment & Plan Note (Signed)
No symptoms of ischemia.  Continue ASA, statin, Coreg, ACEI.

## 2010-11-27 NOTE — Assessment & Plan Note (Signed)
Euvolemic on exam.  Last EF 50%.  NYHA class II-III symptoms.  Continue current doses of Coreg and valsartan.   As above, will get echo and BNP.

## 2010-11-27 NOTE — Patient Instructions (Signed)
Lab today--BMP/BNP/TSH  414.01  786.09  Schedule an appointment for an echocardiogram.  You have the order for fasting lab in December 2012--lipid profile/liver profile/TSH/BMP/BNP 414.01  786.09  Your physician wants you to follow-up in: 6 months with Dr Shirlee Latch. (January 2013). You will receive a reminder letter in the mail two months in advance. If you don't receive a letter, please call our office to schedule the follow-up appointment.

## 2010-11-27 NOTE — Progress Notes (Signed)
PCP: Dr. Charlott Rakes  65 yo with history of CAD, ischemic CMP, and VT s/p ICD placement returns for followup.  He is on amiodarone and has had no recurrent VT.  Today his main complaint continues to be poor stamina.  He gets short of breath after walking 500-600 feet up a fairly steep hill to his mailbox or when doing heavy exertion around the yard.  In general, however, he continues to lead a fairly sedentary life.  No chest pain.  Weight is up 7 lbs.  He has been eating poorly.  Last echo in 7/11 showed EF 50%, mild LVH, akinesis of the basal to mid inferoposterior wall.   ECG: NSR, 1st degree AV block, IVCD, PVCs, old inferior MI  Labs (11/10): K 4.9, creatinine 1.4, LDL 70, HDL 31, LFTs normal Labs (1/61): K 4.4, creatinine 1.3, LDL 61, HDL 35 Labs (7/11): LDL 62, HDL 23 Labs (1/12): K 4.8, LFTs normal, creatinine 1.44, LDL 69, HDL 33 Labs (7/12): LFTs normal, LDL 81, HDL 36, HCT normal  Allergies (verified):  No Known Drug Allergies  Past Medical History: 1. Coronary artery disease.       a.     The patient reports history of silent MI in 1993.  This was likely an inferior MI (see below).       b.     The patient reports history of 2-D echocardiogram in March        2009, showing an EF of 40%.       c.     The patient reports history of Myoview in [facility] in March 2009, which        was per his report normal.       d.     The patient presented to [facility] in 5/10 with VT and mildly elevated cardiac enzymes.         LHC (5/10):  Inferobasal dyskinesis with EF 35-40%.  There was chronic total occlusion of the         mid RCA with good collaterals.  Luminals LCA.  This did not appear to be an acute cause of the 5/10 event.  2. Hypertension.  3. Hyperlipidemia.  4. Remote tobacco abuse with 47-pack-year history, quitting in August 2009.  5. Peripheral arterial disease.       a.     Status post left-to-right fem-fem bypass performed in        [place] in March 2009.       b.      Status post redo left-to-right fem-fem bypass performed by        Dr. Edilia Bo at [facility] in 2009.  6. Rheumatoid arthritis, on leflunomide.  7.  Ischemic cardiomyopathy:  EF 35-40% by LV-gram 5/10 with inferobasal dyskinesis.  Echo 5/10 showed EF 40% with mild LVH, no significant MR, inferobasal and posterobasal akinesis.  Echo (7/11): EF 50%, mild LVH, basal-mid inferoposterior akinesis.  8.  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  He has a dual chamber Medtronic ICD.  9.  Atrial flutter:  Status post isthmus ablation 5/10.   10.  PFTs (7/11): FVC 74%, FEV1 80%, ratio 75%, TLC 78%, DLCO 68%.  This suggests a mild restrictive defect and a mild obstructive defect.  He did have response to bronchodilator. These PFTs were significantly better than the report from Dr. Blenda Nicely in [place] done prior.  11.  Anxiety  Family History: Mother died in  her late 15s with gastric cancer.  Father died in his late 39s with throat cancer and PVD.   He had a brother who died at 36 of an MI.   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 -47ppy hx, quit 2009.  Alcohol Use - yes-minimal Regular Exercise - yes Drug Use - no (prior)  Review of Systems        All systems reviewed and negative except as per HPI.   Current Outpatient Prescriptions  Medication Sig Dispense Refill  . amiodarone (PACERONE) 200 MG tablet        . carvedilol (COREG) 6.25 MG tablet TAKE 1 TABLET TWICE A DAY  180 tablet  3  . DIOVAN 80 MG tablet TAKE 1 TABLET TWICE A DAY  180 tablet  3  . furosemide (LASIX) 20 MG tablet TAKE 1 TABLET DAILY  90 tablet  3  . leflunomide (ARAVA) 20 MG tablet Take 20 mg by mouth daily.        . pantoprazole (PROTONIX) 40 MG tablet Take 40 mg by mouth 2 (two) times daily.        [name] VYTORIN 10-80 MG per tablet TAKE 1 TABLET BY MOUTH AT BEDTIME  90 tablet  3  . DISCONTD: aspirin 325  MG EC tablet Take 325 mg by mouth daily.        [name] aspirin EC 81 MG tablet Take 1 tablet (81 mg total) by mouth daily.        BP 122/72  Pulse 53  Ht 5\' 11"  (1.803 m)  Wt 217 lb (98.431 kg)  BMI 30.27 kg/m2 General:  Well developed, well nourished, in no acute distress. Neck:  Neck supple, no JVD. No masses, thyromegaly or abnormal cervical nodes. Lungs:  Clear bilaterally to auscultation and percussion. Heart:  Non-displaced PMI, chest non-tender; regular rate and rhythm, S1, S2 without murmurs, rubs or gallops. Carotid upstroke normal, no bruit. Normal abdominal aortic size, no bruits. Femorals normal pulses, no bruits. Pedals normal pulses. No edema, no varicosities. Abdomen:  Bowel sounds positive; abdomen soft and non-tender without masses, organomegaly, or hernias noted. No hepatosplenomegaly. Extremities:  No clubbing or cyanosis. Neurologic:  Alert and oriented x 3. Psych:  Normal affect.

## 2010-11-27 NOTE — Assessment & Plan Note (Signed)
Patient has fairly stable NYHA class II-III exertional dyspnea.  I think that a large factor in this is deconditioning and inactivity.  He gets very little exercise.  I again encouraged him to try to find some form of regular physical exercise.  He does not appear volume overloaded on exam.  I will get an echo to make sure that his LV function has not deteriorated and will get a BNP.  PFTs done in 2/12 showed only mild obstructive disease.

## 2010-11-27 NOTE — Assessment & Plan Note (Signed)
Last lipids close to goal.  Encouraged him to modify his diet.

## 2010-11-29 ENCOUNTER — Ambulatory Visit (INDEPENDENT_AMBULATORY_CARE_PROVIDER_SITE_OTHER): Payer: Medicare Other | Admitting: *Deleted

## 2010-11-29 ENCOUNTER — Other Ambulatory Visit: Payer: Self-pay

## 2010-11-29 DIAGNOSIS — I472 Ventricular tachycardia: Secondary | ICD-10-CM

## 2010-11-29 DIAGNOSIS — I4729 Other ventricular tachycardia: Secondary | ICD-10-CM

## 2010-11-30 ENCOUNTER — Ambulatory Visit (HOSPITAL_COMMUNITY): Payer: Medicare Other | Attending: Family Medicine | Admitting: Radiology

## 2010-11-30 DIAGNOSIS — R0609 Other forms of dyspnea: Secondary | ICD-10-CM | POA: Insufficient documentation

## 2010-11-30 DIAGNOSIS — I2589 Other forms of chronic ischemic heart disease: Secondary | ICD-10-CM

## 2010-11-30 DIAGNOSIS — R0989 Other specified symptoms and signs involving the circulatory and respiratory systems: Secondary | ICD-10-CM

## 2010-11-30 DIAGNOSIS — I2510 Atherosclerotic heart disease of native coronary artery without angina pectoris: Secondary | ICD-10-CM

## 2010-11-30 DIAGNOSIS — I379 Nonrheumatic pulmonary valve disorder, unspecified: Secondary | ICD-10-CM | POA: Insufficient documentation

## 2010-11-30 LAB — REMOTE ICD DEVICE
AL IMPEDENCE ICD: 418 Ohm
BAMS-0001: 170 {beats}/min
BATTERY VOLTAGE: 3.1295 V
CHARGE TIME: 8.518 s
RV LEAD IMPEDENCE ICD: 418 Ohm
TOT-0002: 1
TOT-0006: 20100520000000
TZAT-0001ATACH: 1
TZAT-0001ATACH: 2
TZAT-0001FASTVT: 1
TZAT-0001SLOWVT: 1
TZAT-0001SLOWVT: 2
TZAT-0001SLOWVT: 3
TZAT-0002ATACH: NEGATIVE
TZAT-0002ATACH: NEGATIVE
TZAT-0002FASTVT: NEGATIVE
TZAT-0004SLOWVT: 8
TZAT-0004SLOWVT: 8
TZAT-0004SLOWVT: 8
TZAT-0012ATACH: 150 ms
TZAT-0012FASTVT: 200 ms
TZAT-0013SLOWVT: 2
TZAT-0013SLOWVT: 2
TZAT-0018ATACH: NEGATIVE
TZAT-0018ATACH: NEGATIVE
TZAT-0018ATACH: NEGATIVE
TZAT-0018FASTVT: NEGATIVE
TZAT-0019ATACH: 6 V
TZAT-0019ATACH: 6 V
TZAT-0019SLOWVT: 8 V
TZAT-0020ATACH: 1.5 ms
TZAT-0020SLOWVT: 1.5 ms
TZAT-0020SLOWVT: 1.5 ms
TZAT-0020SLOWVT: 1.5 ms
TZON-0003ATACH: 350 ms
TZST-0001ATACH: 4
TZST-0001ATACH: 5
TZST-0001FASTVT: 2
TZST-0001FASTVT: 4
TZST-0001FASTVT: 6
TZST-0001SLOWVT: 4
TZST-0001SLOWVT: 6
TZST-0002ATACH: NEGATIVE
TZST-0002FASTVT: NEGATIVE
TZST-0002FASTVT: NEGATIVE
TZST-0003SLOWVT: 35 J
VENTRICULAR PACING ICD: 0.03 pct

## 2010-12-03 ENCOUNTER — Other Ambulatory Visit: Payer: Self-pay | Admitting: *Deleted

## 2010-12-04 NOTE — Progress Notes (Signed)
ICD remote 

## 2010-12-20 ENCOUNTER — Ambulatory Visit (INDEPENDENT_AMBULATORY_CARE_PROVIDER_SITE_OTHER): Payer: Medicare Other | Admitting: *Deleted

## 2010-12-20 ENCOUNTER — Encounter: Payer: Medicare Other | Admitting: *Deleted

## 2010-12-20 ENCOUNTER — Other Ambulatory Visit: Payer: Self-pay

## 2010-12-20 ENCOUNTER — Encounter: Payer: Self-pay | Admitting: Internal Medicine

## 2010-12-20 DIAGNOSIS — I509 Heart failure, unspecified: Secondary | ICD-10-CM

## 2010-12-20 DIAGNOSIS — I4729 Other ventricular tachycardia: Secondary | ICD-10-CM

## 2010-12-20 DIAGNOSIS — I472 Ventricular tachycardia: Secondary | ICD-10-CM

## 2010-12-20 DIAGNOSIS — I428 Other cardiomyopathies: Secondary | ICD-10-CM

## 2010-12-21 LAB — REMOTE ICD DEVICE
BAMS-0001: 170 {beats}/min
CHARGE TIME: 8.518 s
DEV-0020ICD: NEGATIVE
FVT: 0
PACEART VT: 0
RV LEAD AMPLITUDE: 15.1 mv
TOT-0001: 5
TOT-0002: 1
TZAT-0001ATACH: 2
TZAT-0001ATACH: 3
TZAT-0001FASTVT: 1
TZAT-0002ATACH: NEGATIVE
TZAT-0011SLOWVT: 10 ms
TZAT-0011SLOWVT: 10 ms
TZAT-0011SLOWVT: 10 ms
TZAT-0012ATACH: 150 ms
TZAT-0012ATACH: 150 ms
TZAT-0012FASTVT: 200 ms
TZAT-0012SLOWVT: 200 ms
TZAT-0012SLOWVT: 200 ms
TZAT-0012SLOWVT: 200 ms
TZAT-0013SLOWVT: 2
TZAT-0013SLOWVT: 2
TZAT-0013SLOWVT: 2
TZAT-0018ATACH: NEGATIVE
TZAT-0018ATACH: NEGATIVE
TZAT-0018SLOWVT: NEGATIVE
TZAT-0018SLOWVT: NEGATIVE
TZAT-0019SLOWVT: 8 V
TZAT-0019SLOWVT: 8 V
TZAT-0019SLOWVT: 8 V
TZAT-0020ATACH: 1.5 ms
TZAT-0020FASTVT: 1.5 ms
TZAT-0020SLOWVT: 1.5 ms
TZAT-0020SLOWVT: 1.5 ms
TZON-0003ATACH: 350 ms
TZON-0003SLOWVT: 470 ms
TZON-0003VSLOWVT: 450 ms
TZON-0004SLOWVT: 28
TZON-0004VSLOWVT: 20
TZON-0005SLOWVT: 20
TZST-0001ATACH: 6
TZST-0001FASTVT: 2
TZST-0001FASTVT: 3
TZST-0001FASTVT: 4
TZST-0001SLOWVT: 5
TZST-0002ATACH: NEGATIVE
TZST-0002FASTVT: NEGATIVE
TZST-0003SLOWVT: 20 J
TZST-0003SLOWVT: 35 J
VENTRICULAR PACING ICD: 0.01 pct

## 2010-12-26 ENCOUNTER — Telehealth: Payer: Self-pay | Admitting: Internal Medicine

## 2010-12-26 ENCOUNTER — Other Ambulatory Visit: Payer: Self-pay

## 2010-12-26 ENCOUNTER — Encounter: Payer: Self-pay | Admitting: Internal Medicine

## 2010-12-26 NOTE — Telephone Encounter (Signed)
Per pt call, pt has been having some fluttering and would like to send in a transmission for someone to examine. Pt said he is sending transmission now. Please return pt call to advise/discuss once transmission has been received and evaluated.

## 2010-12-26 NOTE — Telephone Encounter (Signed)
Patient had an episode where he felt like his heart was fluttering. He sent a transmission in to the device clinic which was normal. He denies CP or SOB. He feels fine now. Informed him to call in another transmission if he experiences another episode.

## 2010-12-28 ENCOUNTER — Encounter: Payer: Self-pay | Admitting: *Deleted

## 2010-12-28 ENCOUNTER — Encounter: Payer: Medicare Other | Admitting: Internal Medicine

## 2010-12-28 NOTE — Progress Notes (Signed)
icd remote w/icm  

## 2011-02-16 ENCOUNTER — Other Ambulatory Visit: Payer: Self-pay | Admitting: Cardiology

## 2011-02-19 NOTE — Telephone Encounter (Signed)
I would ask his PCP to OK this refill.

## 2011-02-20 ENCOUNTER — Other Ambulatory Visit: Payer: Self-pay | Admitting: Cardiology

## 2011-02-20 NOTE — Telephone Encounter (Signed)
I would ask his PCP to refill this.

## 2011-02-21 ENCOUNTER — Other Ambulatory Visit: Payer: Self-pay | Admitting: Cardiology

## 2011-02-21 ENCOUNTER — Telehealth: Payer: Self-pay | Admitting: Cardiology

## 2011-02-21 MED ORDER — PANTOPRAZOLE SODIUM 40 MG PO TBEC: DELAYED_RELEASE_TABLET | ORAL | Status: DC

## 2011-02-21 NOTE — Telephone Encounter (Signed)
Pt has not gotten her Rx for pantoprazole 40mg  bid 90day supply pt wanted to know if he can stop taking furosemide

## 2011-02-21 NOTE — Telephone Encounter (Signed)
Pt is calling back/please call

## 2011-02-21 NOTE — Telephone Encounter (Signed)
I talked with pt. Pt is requesting to stop lasix. He states he has had a cough and a tickle in the back of his throat when he lays down for about 2 years. He mistakenly missed a lasix dose and he did not have a cough the day he missed his lasix. Pt also requesting a refill of pantoprazole 40mg  bid. I will review with Dr Shirlee Latch.

## 2011-02-21 NOTE — Telephone Encounter (Signed)
I reviewed with Dr Shirlee Latch. Dr Shirlee Latch stated he did not want pt to stop furosemide. He will refill pantoprazole for 90 days.  Pt will follow-up with his PCP about further recommendations for his cough/ reflux. Pt is aware that he should get future refills for pantoprazole from his PCP.

## 2011-02-28 ENCOUNTER — Ambulatory Visit (INDEPENDENT_AMBULATORY_CARE_PROVIDER_SITE_OTHER): Payer: Medicare Other | Admitting: *Deleted

## 2011-02-28 ENCOUNTER — Other Ambulatory Visit: Payer: Self-pay

## 2011-02-28 ENCOUNTER — Encounter: Payer: Medicare Other | Admitting: *Deleted

## 2011-02-28 ENCOUNTER — Encounter: Payer: Self-pay | Admitting: Internal Medicine

## 2011-02-28 DIAGNOSIS — I509 Heart failure, unspecified: Secondary | ICD-10-CM

## 2011-02-28 DIAGNOSIS — I4729 Other ventricular tachycardia: Secondary | ICD-10-CM

## 2011-02-28 DIAGNOSIS — I472 Ventricular tachycardia: Secondary | ICD-10-CM

## 2011-03-03 LAB — REMOTE ICD DEVICE
AL IMPEDENCE ICD: 418 Ohm
BAMS-0001: 170 {beats}/min
BATTERY VOLTAGE: 3.1291 V
CHARGE TIME: 8.518 s
PACEART VT: 0
TOT-0001: 5
TOT-0002: 1
TOT-0006: 20100520000000
TZAT-0001ATACH: 1
TZAT-0001ATACH: 2
TZAT-0001FASTVT: 1
TZAT-0001SLOWVT: 1
TZAT-0001SLOWVT: 2
TZAT-0001SLOWVT: 3
TZAT-0002ATACH: NEGATIVE
TZAT-0002ATACH: NEGATIVE
TZAT-0002FASTVT: NEGATIVE
TZAT-0004SLOWVT: 8
TZAT-0004SLOWVT: 8
TZAT-0012ATACH: 150 ms
TZAT-0012FASTVT: 200 ms
TZAT-0012SLOWVT: 200 ms
TZAT-0012SLOWVT: 200 ms
TZAT-0012SLOWVT: 200 ms
TZAT-0013SLOWVT: 2
TZAT-0018ATACH: NEGATIVE
TZAT-0018ATACH: NEGATIVE
TZAT-0018FASTVT: NEGATIVE
TZAT-0019ATACH: 6 V
TZAT-0019SLOWVT: 8 V
TZAT-0019SLOWVT: 8 V
TZAT-0019SLOWVT: 8 V
TZAT-0020ATACH: 1.5 ms
TZAT-0020SLOWVT: 1.5 ms
TZAT-0020SLOWVT: 1.5 ms
TZAT-0020SLOWVT: 1.5 ms
TZON-0003ATACH: 350 ms
TZON-0003SLOWVT: 470 ms
TZON-0004VSLOWVT: 20
TZST-0001ATACH: 4
TZST-0001FASTVT: 2
TZST-0001FASTVT: 4
TZST-0001FASTVT: 5
TZST-0001FASTVT: 6
TZST-0001SLOWVT: 4
TZST-0001SLOWVT: 6
TZST-0002ATACH: NEGATIVE
TZST-0002ATACH: NEGATIVE
TZST-0002FASTVT: NEGATIVE
TZST-0002FASTVT: NEGATIVE
TZST-0002FASTVT: NEGATIVE
TZST-0003SLOWVT: 35 J
VENTRICULAR PACING ICD: 0.03 pct

## 2011-03-08 NOTE — Progress Notes (Signed)
icd remote w/icm  

## 2011-03-12 ENCOUNTER — Telehealth: Payer: Self-pay | Admitting: *Deleted

## 2011-03-12 ENCOUNTER — Encounter: Payer: Self-pay | Admitting: *Deleted

## 2011-03-12 NOTE — Telephone Encounter (Signed)
Thanks. Thurston Hole, could you request that Mr Bond come in for an office visit based on worsening optivol readings? Tell him that I'd like to adjust his medications, will be easier with him to do this in person.  [place] ----- Message ----- From: Gardiner Rhyme, MD Sent: 03/11/2011 5:54 PM To: Marca Ancona, MD Subject: patient   Mr Choate's optivol in October and as of 02/28/11 interrogation continues to rise. He may benefit from evaluation.   03/12/11 Pt is aware I have given him  an appt with Dr Shirlee Latch 03/13/11.

## 2011-03-13 ENCOUNTER — Ambulatory Visit (INDEPENDENT_AMBULATORY_CARE_PROVIDER_SITE_OTHER): Payer: Medicare Other | Admitting: Cardiology

## 2011-03-13 ENCOUNTER — Encounter: Payer: Self-pay | Admitting: Cardiology

## 2011-03-13 ENCOUNTER — Telehealth: Payer: Self-pay | Admitting: Cardiology

## 2011-03-13 DIAGNOSIS — I472 Ventricular tachycardia: Secondary | ICD-10-CM

## 2011-03-13 DIAGNOSIS — I5022 Chronic systolic (congestive) heart failure: Secondary | ICD-10-CM

## 2011-03-13 DIAGNOSIS — I2589 Other forms of chronic ischemic heart disease: Secondary | ICD-10-CM

## 2011-03-13 DIAGNOSIS — I4729 Other ventricular tachycardia: Secondary | ICD-10-CM

## 2011-03-13 DIAGNOSIS — I2510 Atherosclerotic heart disease of native coronary artery without angina pectoris: Secondary | ICD-10-CM

## 2011-03-13 DIAGNOSIS — I739 Peripheral vascular disease, unspecified: Secondary | ICD-10-CM

## 2011-03-13 DIAGNOSIS — E785 Hyperlipidemia, unspecified: Secondary | ICD-10-CM

## 2011-03-13 MED ORDER — TORSEMIDE 10 MG PO TABS: 10.0000 mg | ORAL_TABLET | Freq: Every day | ORAL | Status: DC

## 2011-03-13 NOTE — Patient Instructions (Addendum)
Do not take Lasix.  Take Torsemide 10mg  daily.  Fasting lab in 2 weeks--lipid profile/liver profile/TSH/BMP/BNP 414.01  786.09--you have the order. Please fax the results to [phone] Dr Shirlee Latch.   Send an Optivol reading in 2 weeks to Dr Rita Ohara will ask the device clinic to call you to arrange this.   Keep the appointment with Dr Shirlee Latch Monday January 28,2013 at 8:30am.

## 2011-03-13 NOTE — Telephone Encounter (Signed)
I send second prescription into [facility] for torsemide 10mg  #90 3 refills. Pt is aware and I confirmed with [facility] that the 90 day prescription was received.

## 2011-03-13 NOTE — Telephone Encounter (Signed)
Pt calling regarding a medication error, for RX for torsemide 10 mg. Please return pt call to discuss further.   [id number]  RX was for 30 days and pt would like a 90 day supply.

## 2011-03-14 DIAGNOSIS — I739 Peripheral vascular disease, unspecified: Secondary | ICD-10-CM | POA: Insufficient documentation

## 2011-03-14 NOTE — Assessment & Plan Note (Signed)
Last lipids close to goal.  Will check lipids in 2 wks.

## 2011-03-14 NOTE — Assessment & Plan Note (Signed)
Has ICD.  No tachypalpitations or ICD discharges.  Continue regular followup.  He is on amiodarone to suppress VT.  LFTs/TSH were normal in 7/12.  PFTs were stable in 2/12, follow yearly given mild abnormalities likely due to COPD.  He has been getting yearly eye exams.

## 2011-03-14 NOTE — Assessment & Plan Note (Signed)
History of left to right fem-fem bypass.  Will need to make sure he has followup with vascular surgery.

## 2011-03-14 NOTE — Progress Notes (Signed)
PCP: Dr. Charlott Rakes  65 yo with history of CAD, ischemic CMP, and VT s/p ICD placement returns for followup.  He is on amiodarone and has had no recurrent VT.   Last echo in 7/12 showed EF 45-50%, mild LVH, akinesis of the basal to mid inferoposterior wall.  He has been having problems with a chronic cough.  I switched him from ACEI to ARB and tried him on Protonix for GERD with no relief.  On his own, he stopped Lasix with the thought that this might have been causing his cough.  His cough actually became considerably better off Lasix.  He has not taken any Lasix now since early October.  Optivol transmissions have shown decreased thoracic impedance suggesting increasing fluid accumulation. He does not think his breathing is any worse since stopping Lasix.  He continues to get short of breath walking up inclines.  No orthopnea.  No chest pain.  He is not very active.  Weight is up 1 lb.    ECG: NSR, 1st degree AV block, IVCD  Labs (11/10): K 4.9, creatinine 1.4, LDL 70, HDL 31, LFTs normal Labs (4/09): K 4.4, creatinine 1.3, LDL 61, HDL 35 Labs (7/11): LDL 62, HDL 23 Labs (1/12): K 4.8, LFTs normal, creatinine 1.44, LDL 69, HDL 33 Labs (7/12): LFTs normal, LDL 81, HDL 36, HCT normal, K 4.3, creatinine 1.3, BNP 116, TSH normal  Allergies (verified):  No Known Drug Allergies  Past Medical History: 1. Coronary artery disease.       a.     The patient reports history of silent MI in 1993.  This was likely an inferior MI (see below).       b.     The patient reports history of 2-D echocardiogram in March        2009, showing an EF of 40%.       c.     The patient reports history of Myoview in [facility] in March 2009, which        was per his report normal.       d.     The patient presented to [facility] in 5/10 with VT and mildly elevated cardiac enzymes.         LHC (5/10):  Inferobasal dyskinesis with EF 35-40%.  There was chronic total occlusion of the         mid RCA with good  collaterals.  Luminals LCA.  This did not appear to be an acute cause of the 5/10 event.  2. Hypertension.  3. Hyperlipidemia.  4. Remote tobacco abuse with 47-pack-year history, quitting in August 2009.  5. Peripheral arterial disease.       a.     Status post left-to-right fem-fem bypass performed in        [place] in March 2009.       b.     Status post redo left-to-right fem-fem bypass performed by        Dr. Edilia Bo at [facility] in 2009.  6. Rheumatoid arthritis, on leflunomide.  7.  Ischemic cardiomyopathy:  EF 35-40% by LV-gram 5/10 with inferobasal dyskinesis.  Echo 5/10 showed EF 40% with mild LVH, no significant MR, inferobasal and posterobasal akinesis.  Echo (7/11): EF 50%, mild LVH, basal-mid inferoposterior akinesis.  Echo (7/12): EF 45-50% with basal anterolateral, basal posterior, and basal to mid inferior akinesis.   8.  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  He has a dual chamber Medtronic ICD.  9.  Atrial flutter:  Status post isthmus ablation 5/10.   10.  PFTs (7/11): FVC 74%, FEV1 80%, ratio 75%, TLC 78%, DLCO 68%.  This suggests a mild restrictive defect and a mild obstructive defect.  He did have response to bronchodilator. These PFTs were significantly better than the report from Dr. Blenda Nicely in [place] done prior.  11.  Anxiety 12.  Chronic cough: No relief with change from ACEI to ARB or with trial of PPI.   Family History: Mother died in her late 56s with gastric cancer.  Father died in his late 7s with throat cancer and PVD.   He had a brother who died at 37 of an MI.   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 -47ppy hx, quit 2009.  Alcohol Use - yes-minimal Regular Exercise - yes Drug Use - no (prior)  Review of Systems        All systems reviewed and negative except as per HPI.   Current Outpatient Prescriptions  Medication Sig  Dispense Refill  . amiodarone (PACERONE) 200 MG tablet        . aspirin EC 81 MG tablet Take 1 tablet (81 mg total) by mouth daily.      . carvedilol (COREG) 6.25 MG tablet TAKE 1 TABLET TWICE A DAY  180 tablet  3  . DIOVAN 80 MG tablet TAKE 1 TABLET TWICE A DAY  180 tablet  3  . leflunomide (ARAVA) 20 MG tablet Take 20 mg by mouth daily.        [name] VYTORIN 10-80 MG per tablet TAKE 1 TABLET BY MOUTH AT BEDTIME  90 tablet  3  . pantoprazole (PROTONIX) 40 MG tablet One tablet twice a day  180 tablet  0  . torsemide (DEMADEX) 10 MG tablet Take 1 tablet (10 mg total) by mouth daily.  90 tablet  3    BP 123/76  Pulse 59  Resp 18  Ht 5\' 11"  (1.803 m)  Wt 99.066 kg (218 lb 6.4 oz)  BMI 30.46 kg/m2 General:  Well developed, well nourished, in no acute distress. Neck:  Neck supple, no JVD. No masses, thyromegaly or abnormal cervical nodes. Lungs:  Clear bilaterally to auscultation and percussion. Heart:  Non-displaced PMI, chest non-tender; regular rate and rhythm, S1, S2 without murmurs, rubs or gallops. Carotid upstroke normal, no bruit. Normal abdominal aortic size, no bruits. Femorals normal pulses, no bruits. Pedals normal pulses. No edema, no varicosities. Abdomen:  Bowel sounds positive; abdomen soft and non-tender without masses, organomegaly, or hernias noted. No hepatosplenomegaly. Extremities:  No clubbing or cyanosis. Neurologic:  Alert and oriented x 3. Psych:  Normal affect.

## 2011-03-14 NOTE — Assessment & Plan Note (Addendum)
Optivol suggests fluid accumulation and weight is up 1 pound though patient does not appear particularly volume overloaded on exam.  He stopped his Lasix thinking it caused a cough.  This would be an unusual side effect for Lasix, but he had been very bothered by his cough and feels much better now.  I will try him on a different diuretic: torsemide 10 mg daily.  If he develops recurrent cough I would probably send him for evaluation by pulmonary.  BMET/BNP in 2 wks.  I will get a remote Optivol reading in 2 weeks.  Continue current Coreg and valsartan doses.

## 2011-03-26 ENCOUNTER — Encounter: Payer: Self-pay | Admitting: Cardiology

## 2011-04-04 ENCOUNTER — Ambulatory Visit (INDEPENDENT_AMBULATORY_CARE_PROVIDER_SITE_OTHER): Payer: Medicare Other | Admitting: *Deleted

## 2011-04-04 DIAGNOSIS — I472 Ventricular tachycardia: Secondary | ICD-10-CM

## 2011-04-04 DIAGNOSIS — I4729 Other ventricular tachycardia: Secondary | ICD-10-CM

## 2011-04-04 DIAGNOSIS — I509 Heart failure, unspecified: Secondary | ICD-10-CM

## 2011-04-05 LAB — REMOTE ICD DEVICE
AL AMPLITUDE: 1.5 mv
AL IMPEDENCE ICD: 475 Ohm
ATRIAL PACING ICD: 1.87 pct
BATTERY VOLTAGE: 3.1295 V
CHARGE TIME: 8.518 s
RV LEAD IMPEDENCE ICD: 475 Ohm
TOT-0002: 1
TOT-0006: 20100520000000
TZAT-0001ATACH: 1
TZAT-0001FASTVT: 1
TZAT-0001SLOWVT: 1
TZAT-0001SLOWVT: 2
TZAT-0001SLOWVT: 3
TZAT-0002ATACH: NEGATIVE
TZAT-0002ATACH: NEGATIVE
TZAT-0002FASTVT: NEGATIVE
TZAT-0004SLOWVT: 8
TZAT-0004SLOWVT: 8
TZAT-0004SLOWVT: 8
TZAT-0005SLOWVT: 78 pct
TZAT-0005SLOWVT: 78 pct
TZAT-0005SLOWVT: 81 pct
TZAT-0012ATACH: 150 ms
TZAT-0012FASTVT: 200 ms
TZAT-0013SLOWVT: 2
TZAT-0013SLOWVT: 2
TZAT-0013SLOWVT: 2
TZAT-0018ATACH: NEGATIVE
TZAT-0018FASTVT: NEGATIVE
TZAT-0018SLOWVT: NEGATIVE
TZAT-0019ATACH: 6 V
TZAT-0019ATACH: 6 V
TZAT-0019FASTVT: 8 V
TZAT-0020ATACH: 1.5 ms
TZAT-0020SLOWVT: 1.5 ms
TZAT-0020SLOWVT: 1.5 ms
TZAT-0020SLOWVT: 1.5 ms
TZON-0004SLOWVT: 28
TZST-0001ATACH: 4
TZST-0001ATACH: 5
TZST-0001FASTVT: 4
TZST-0001FASTVT: 5
TZST-0001FASTVT: 6
TZST-0001SLOWVT: 4
TZST-0001SLOWVT: 6
TZST-0002ATACH: NEGATIVE
TZST-0002FASTVT: NEGATIVE
TZST-0002FASTVT: NEGATIVE
TZST-0002FASTVT: NEGATIVE
TZST-0002FASTVT: NEGATIVE
TZST-0003SLOWVT: 35 J
TZST-0003SLOWVT: 35 J
VENTRICULAR PACING ICD: 0 pct
VF: 0

## 2011-04-09 NOTE — Progress Notes (Signed)
Remote icd check w/icm  

## 2011-05-02 ENCOUNTER — Encounter: Payer: Self-pay | Admitting: *Deleted

## 2011-06-03 ENCOUNTER — Ambulatory Visit: Payer: Medicare Other | Admitting: Cardiology

## 2011-06-04 ENCOUNTER — Ambulatory Visit (INDEPENDENT_AMBULATORY_CARE_PROVIDER_SITE_OTHER): Payer: Medicare Other | Admitting: Cardiology

## 2011-06-04 ENCOUNTER — Encounter: Payer: Self-pay | Admitting: Cardiology

## 2011-06-04 VITALS — BP 108/60 | HR 60 | Ht 70.0 in | Wt 214.0 lb

## 2011-06-04 DIAGNOSIS — I4891 Unspecified atrial fibrillation: Secondary | ICD-10-CM | POA: Diagnosis not present

## 2011-06-04 DIAGNOSIS — I472 Ventricular tachycardia: Secondary | ICD-10-CM

## 2011-06-04 DIAGNOSIS — I4729 Other ventricular tachycardia: Secondary | ICD-10-CM | POA: Diagnosis not present

## 2011-06-04 DIAGNOSIS — M545 Low back pain, unspecified: Secondary | ICD-10-CM | POA: Diagnosis not present

## 2011-06-04 DIAGNOSIS — J209 Acute bronchitis, unspecified: Secondary | ICD-10-CM | POA: Insufficient documentation

## 2011-06-04 DIAGNOSIS — I2589 Other forms of chronic ischemic heart disease: Secondary | ICD-10-CM

## 2011-06-04 DIAGNOSIS — I2510 Atherosclerotic heart disease of native coronary artery without angina pectoris: Secondary | ICD-10-CM

## 2011-06-04 DIAGNOSIS — E785 Hyperlipidemia, unspecified: Secondary | ICD-10-CM | POA: Diagnosis not present

## 2011-06-04 DIAGNOSIS — M069 Rheumatoid arthritis, unspecified: Secondary | ICD-10-CM | POA: Diagnosis not present

## 2011-06-04 DIAGNOSIS — I739 Peripheral vascular disease, unspecified: Secondary | ICD-10-CM

## 2011-06-04 LAB — LIPID PANEL
Cholesterol: 96 mg/dL (ref 0–200)
LDL Cholesterol: 39 mg/dL (ref 0–99)
Total CHOL/HDL Ratio: 3
VLDL: 26.6 mg/dL (ref 0.0–40.0)

## 2011-06-04 LAB — HEPATIC FUNCTION PANEL
Bilirubin, Direct: 0.1 mg/dL (ref 0.0–0.3)
Total Protein: 7.5 g/dL (ref 6.0–8.3)

## 2011-06-04 MED ORDER — DOXYCYCLINE HYCLATE 100 MG PO TABS: 100.0000 mg | ORAL_TABLET | Freq: Two times a day (BID) | ORAL | Status: AC

## 2011-06-04 NOTE — Assessment & Plan Note (Signed)
Due for lipids/LFTs.  He can take atorvastatin 80 mg daily rather than Vytorin (much less expensive).

## 2011-06-04 NOTE — Assessment & Plan Note (Signed)
History of left to right fem-fem bypass.  Will need to make sure he has followup with vascular surgery.  

## 2011-06-04 NOTE — Assessment & Plan Note (Signed)
No symptoms of ischemia.  Continue ASA, statin, Coreg, ACEI.  

## 2011-06-04 NOTE — Patient Instructions (Addendum)
Take doxycycline 100mg  twice a day for 10 days.   If you are not feeling better in a couple of days have a chest xray done at [facility]. You have the order.  Stop Vytorin.  Take atorvastatin 80mg  daily.  Take valsartan 80mg  twice a day instead of Diovan.  Your physician recommends that you have  lab work today--lipid profile/liver profile/TSH   Your physician has recommended that you have a pulmonary function test. Pulmonary Function Tests are a group of tests that measure how well air moves in and out of your lungs. July 2013  Your physician wants you to follow-up in: 6 months with Dr Shirlee Latch.(July 2013).You will receive a reminder letter in the mail two months in advance. If you don't receive a letter, please call our office to schedule the follow-up appointment.

## 2011-06-04 NOTE — Assessment & Plan Note (Signed)
PNA versus acute bronchitis.  Lungs quite rhonchorous.  I will give him 10 days of doxycycline and have him get a CXR at [facility].

## 2011-06-04 NOTE — Assessment & Plan Note (Signed)
Last EF 45-50% on echo.  He does not appear volume overloaded on exam.  Symptoms had been stable until recent respiratory illness.  I think that this is acute bronchitis versus PNA.  He is seeing Dr. Johney Frame later this week and will get Optivol reading then.  Continue torsemide at current dose, Coreg, and valsartan.  BMET today.

## 2011-06-04 NOTE — Progress Notes (Signed)
PCP: Dr. Charlott Rakes  66 yo with history of CAD, ischemic CMP, and VT s/p ICD placement returns for followup.  He is on amiodarone and has had no recurrent VT.   Last echo in 7/12 showed EF 45-50%, mild LVH, akinesis of the basal to mid inferoposterior wall.  At last appointment, he had been off diuretic and Optivol impedance had significantly decreased.  I started him on torsemide 10 (? Cough with Lasix) and impedance went back up to baseline.  He had been stable symptomatically (no chest pain, dyspnea walking up a hill) until last week.  Since then, he has had a sore throat, productive cough, congestion, and some pleuritic chest pain.  He does not know if he has been running a fever.  Weight is stable at 214.   Labs (11/10): K 4.9, creatinine 1.4, LDL 70, HDL 31, LFTs normal Labs (1/61): K 4.4, creatinine 1.3, LDL 61, HDL 35 Labs (7/11): LDL 62, HDL 23 Labs (1/12): K 4.8, LFTs normal, creatinine 1.44, LDL 69, HDL 33 Labs (7/12): LFTs normal, LDL 81, HDL 36, HCT normal, K 4.3, creatinine 1.3, BNP 116, TSH normal  Allergies (verified):  No Known Drug Allergies  Past Medical History: 1. Coronary artery disease.       a.     The patient reports history of silent MI in 1993.  This was likely an inferior MI (see below).       b.     The patient reports history of 2-D echocardiogram in March        2009, showing an EF of 40%.       c.     The patient reports history of Myoview in [facility] in March 2009, which        was per his report normal.       d.     The patient presented to [facility] in 5/10 with VT and mildly elevated cardiac enzymes.         LHC (5/10):  Inferobasal dyskinesis with EF 35-40%.  There was chronic total occlusion of the         mid RCA with good collaterals.  Luminals LCA.  This did not appear to be an acute cause of the 5/10 event.  2. Hypertension.  3. Hyperlipidemia.  4. Remote tobacco abuse with 47-pack-year history, quitting in August 2009.  5. Peripheral  arterial disease.       a.     Status post left-to-right fem-fem bypass performed in        [place] in March 2009.       b.     Status post redo left-to-right fem-fem bypass performed by        Dr. Edilia Bo at [facility] in 2009.  6. Rheumatoid arthritis, on leflunomide.  7.  Ischemic cardiomyopathy:  EF 35-40% by LV-gram 5/10 with inferobasal dyskinesis.  Echo 5/10 showed EF 40% with mild LVH, no significant MR, inferobasal and posterobasal akinesis.  Echo (7/11): EF 50%, mild LVH, basal-mid inferoposterior akinesis.  Echo (7/12): EF 45-50% with basal anterolateral, basal posterior, and basal to mid inferior akinesis.   8.  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  He has a dual chamber Medtronic ICD.  9.  Atrial flutter:  Status post isthmus ablation 5/10.   10.  PFTs (7/11): FVC 74%, FEV1 80%, ratio 75%, TLC 78%, DLCO 68%.  This suggests a mild restrictive defect and a  mild obstructive defect.  He did have response to bronchodilator. These PFTs were significantly better than the report from Dr. Blenda Nicely in [place] done prior.  He had last PFTs in 2/12 with no significant change.  11.  Anxiety 12.  Chronic cough: No relief with change from ACEI to ARB or with trial of PPI.   Family History: Mother died in her late 28s with gastric cancer.  Father died in his late 51s with throat cancer and PVD.   He had a brother who died at 42 of an MI.   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 -47ppy hx, quit 2009.  Alcohol Use - yes-minimal Regular Exercise - yes Drug Use - no (prior)  Review of Systems        All systems reviewed and negative except as per HPI.   Current Outpatient Prescriptions  Medication Sig Dispense Refill  . amiodarone (PACERONE) 200 MG tablet Take 200 mg by mouth daily.       [name] aspirin EC 81 MG tablet Take 1 tablet (81 mg total) by mouth daily.      .  carvedilol (COREG) 6.25 MG tablet TAKE 1 TABLET TWICE A DAY  180 tablet  3  . DIOVAN 80 MG tablet TAKE 1 TABLET TWICE A DAY  180 tablet  3  . leflunomide (ARAVA) 20 MG tablet Take 20 mg by mouth daily.        [name] torsemide (DEMADEX) 10 MG tablet Take 1 tablet (10 mg total) by mouth daily.  90 tablet  3  . VYTORIN 10-80 MG per tablet TAKE 1 TABLET BY MOUTH AT BEDTIME  90 tablet  3  . doxycycline (VIBRA-TABS) 100 MG tablet Take 1 tablet (100 mg total) by mouth 2 (two) times daily.  20 tablet  0  . pantoprazole (PROTONIX) 40 MG tablet One tablet twice a day  180 tablet  0    BP 108/60  Pulse 60  Ht 5\' 10"  (1.778 m)  Wt 97.07 kg (214 lb)  BMI 30.71 kg/m2 General:  Well developed, well nourished, in no acute distress. Neck:  Neck supple, no JVD. No masses, thyromegaly or abnormal cervical nodes. Lungs:  Rhonchi L>R Heart:  Non-displaced PMI, chest non-tender; regular rate and rhythm, S1, S2 without murmurs, rubs or gallops. Carotid upstroke normal, no bruit. Normal abdominal aortic size, no bruits. Femorals normal pulses, no bruits. Pedals normal pulses. No edema, no varicosities. Abdomen:  Bowel sounds positive; abdomen soft and non-tender without masses, organomegaly, or hernias noted. No hepatosplenomegaly. Extremities:  No clubbing or cyanosis. Neurologic:  Alert and oriented x 3. Psych:  Normal affect.

## 2011-06-06 ENCOUNTER — Encounter: Payer: Medicare Other | Admitting: Internal Medicine

## 2011-06-06 DIAGNOSIS — Z79899 Other long term (current) drug therapy: Secondary | ICD-10-CM | POA: Diagnosis not present

## 2011-06-06 DIAGNOSIS — R059 Cough, unspecified: Secondary | ICD-10-CM | POA: Diagnosis not present

## 2011-06-06 DIAGNOSIS — M069 Rheumatoid arthritis, unspecified: Secondary | ICD-10-CM | POA: Diagnosis not present

## 2011-06-06 DIAGNOSIS — R05 Cough: Secondary | ICD-10-CM | POA: Diagnosis not present

## 2011-06-07 ENCOUNTER — Telehealth: Payer: Self-pay | Admitting: Cardiology

## 2011-06-07 NOTE — Telephone Encounter (Signed)
Dr Shirlee Latch reviewed chest xray done 06/06/11. Impression: no acute cardiopulmonary process. No PNA per Dr Shirlee Latch. Pt is aware of these results. He will follow-up with his PCP if symptoms of cough and congestion continue.

## 2011-06-07 NOTE — Telephone Encounter (Signed)
New problem Please call about test results from yesterday

## 2011-06-13 ENCOUNTER — Telehealth: Payer: Self-pay | Admitting: Internal Medicine

## 2011-06-13 NOTE — Telephone Encounter (Signed)
New problem Pt wants to discuss appt for [id number]. He said [facility] was showing that he saw Dr Johney Frame and He said he saw Dr Shirlee Latch. Please call to discuss

## 2011-06-24 ENCOUNTER — Encounter: Payer: Self-pay | Admitting: Internal Medicine

## 2011-06-24 ENCOUNTER — Ambulatory Visit (INDEPENDENT_AMBULATORY_CARE_PROVIDER_SITE_OTHER): Payer: Medicare Other | Admitting: Internal Medicine

## 2011-06-24 DIAGNOSIS — J209 Acute bronchitis, unspecified: Secondary | ICD-10-CM

## 2011-06-24 DIAGNOSIS — I2589 Other forms of chronic ischemic heart disease: Secondary | ICD-10-CM

## 2011-06-24 DIAGNOSIS — I4729 Other ventricular tachycardia: Secondary | ICD-10-CM

## 2011-06-24 DIAGNOSIS — I472 Ventricular tachycardia: Secondary | ICD-10-CM

## 2011-06-24 LAB — ICD DEVICE OBSERVATION
AL AMPLITUDE: 2.125 mv
ATRIAL PACING ICD: 4 pct
BAMS-0001: 170 {beats}/min
FVT: 0
RV LEAD AMPLITUDE: 15 mv
RV LEAD IMPEDENCE ICD: 475 Ohm
RV LEAD THRESHOLD: 1.5 V
TZAT-0001ATACH: 1
TZAT-0001ATACH: 3
TZAT-0002ATACH: NEGATIVE
TZAT-0002ATACH: NEGATIVE
TZAT-0004SLOWVT: 8
TZAT-0004SLOWVT: 8
TZAT-0004SLOWVT: 8
TZAT-0011SLOWVT: 10 ms
TZAT-0011SLOWVT: 10 ms
TZAT-0012ATACH: 150 ms
TZAT-0012ATACH: 150 ms
TZAT-0012FASTVT: 200 ms
TZAT-0012SLOWVT: 200 ms
TZAT-0012SLOWVT: 200 ms
TZAT-0012SLOWVT: 200 ms
TZAT-0018ATACH: NEGATIVE
TZAT-0018ATACH: NEGATIVE
TZAT-0018FASTVT: NEGATIVE
TZAT-0019ATACH: 6 V
TZAT-0019FASTVT: 8 V
TZAT-0020FASTVT: 1.5 ms
TZAT-0020SLOWVT: 1.5 ms
TZAT-0020SLOWVT: 1.5 ms
TZAT-0020SLOWVT: 1.5 ms
TZON-0003ATACH: 350 ms
TZON-0003SLOWVT: 470 ms
TZST-0001ATACH: 4
TZST-0001ATACH: 5
TZST-0001ATACH: 6
TZST-0001FASTVT: 2
TZST-0001FASTVT: 6
TZST-0001SLOWVT: 4
TZST-0001SLOWVT: 6
TZST-0002ATACH: NEGATIVE
TZST-0002ATACH: NEGATIVE
TZST-0002FASTVT: NEGATIVE
TZST-0003SLOWVT: 20 J
TZST-0003SLOWVT: 35 J
VF: 0

## 2011-06-24 NOTE — Assessment & Plan Note (Signed)
resolved 

## 2011-06-24 NOTE — Progress Notes (Signed)
PCP:  Nathan Pruitt,FRANCISCO, MD, MD Primary Cardiologist:  Dr Shirlee Latch  The patient presents today for routine electrophysiology followup.  Since last being seen in our clinic, the patient reports doing reasonably well.  He is recovering from a recent URI.  Symptoms have resolved.  He reports that he had some nausea with doxycycline.  Today, he denies symptoms of palpitations, chest pain, shortness of breath, orthopnea, PND, lower extremity edema, dizziness, presyncope, syncope, or neurologic sequela.  He has received no recent ICD shocks.  The patient feels that he is tolerating medications without difficulties and is otherwise without complaint today.   Past Medical History  Diagnosis Date  . CAD (coronary artery disease)     hx of silent MI in 1993. likely an inferior MI. hx of 2D cardiogram in 3/09 showing EF of 40%. hx of myoview in [place] 3/09-nml. presented to [facility] 5/10 with VT and mildly elevated cardiac enzymes LHC (5/10): inferobasal dyskinesis with EF 35-40%. was chronic total occlusion of mid RCA with good collaterals. luminals LCA. this does not appear to be an acute cause of the 5/10 event  . HTN (hypertension)   . HLD (hyperlipidemia)   . Tobacco abuse     47 pack year hx; quit 8/09  . PAD (peripheral artery disease)     s/p L-to-R fem-fem bypass performed by Dr. Edilia Bo at [facility] in 2009   . Rheumatoid arthritis     on leflunomide  . Ischemic cardiomyopathy     EF 35-40% by LV-gram 5/10 with inferobasal dyskinesis. echo 5/10 showed EF 40% w/mild LVH, no sig. MR, inferobasal and posterobasal akinesis. echo (7/11): EF 50%, mild LVH, basal-mid inferoposterior akenesis  . 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 He has duel chamber Medtronic ICD  . Atrial flutter     s/p isthmus ablation 5/10  . Abnormal PFTs 7/11    FVC 74%, FEV1 80%, ratio 75%, TLC 78%, DLCO 68%. this suggests a mild restrictive and obstructive defect. pt did  have a response to bronchodilator. these PFTs were significantly better than the report from Dr. Blenda Nicely in [place] done prior.   Nathan Pruitt Anxiety    Past Surgical History  Procedure Date  . Vasectomy   . Tonsillectomy   . Cardiac defibrillator placement     Medtronic; [employer] - yes     Current Outpatient Prescriptions  Medication Sig Dispense Refill  . amiodarone (PACERONE) 200 MG tablet Take 200 mg by mouth daily.       Nathan Pruitt aspirin EC 81 MG tablet Take 1 tablet (81 mg total) by mouth daily.      . carvedilol (COREG) 6.25 MG tablet TAKE 1 TABLET TWICE A DAY  180 tablet  3  . cyclobenzaprine (FLEXERIL) 5 MG tablet Take 5 mg by mouth as needed.      Nathan Pruitt DIOVAN 80 MG tablet TAKE 1 TABLET TWICE A DAY  180 tablet  3  . leflunomide (ARAVA) 20 MG tablet Take 20 mg by mouth daily.        Nathan Pruitt torsemide (DEMADEX) 10 MG tablet Take 1 tablet (10 mg total) by mouth daily.  90 tablet  3  . VYTORIN 10-80 MG per tablet TAKE 1 TABLET BY MOUTH AT BEDTIME  90 tablet  3    No Known Allergies  History   Social History  . Marital Status: Single    Spouse Name: N/A    Number of Children: N/A  .  Years of Education: N/A   Occupational History  . Not on file.   Social History Main Topics  . Smoking status: Former Games developer  . Smokeless tobacco: Not on file   Comment: med hx   . Alcohol Use: Yes     minimal   . Drug Use: No     prior   . Sexually Active: Not on file   Other Topics Concern  . 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   Head- normocephalic, atraumatic Eyes-  Sclera clear, conjunctiva pink Ears- hearing intact Oropharynx- clear Neck- supple, no JVP Lymph- no cervical lymphadenopathy Lungs- Clear to ausculation bilaterally, normal  work of breathing Chest- ICD pocket is well healed Heart- Regular rate and rhythm, no murmurs, rubs or gallops, PMI not laterally displaced GI- soft, NT, ND, + BS Extremities- no clubbing, cyanosis, or edema  ICD interrogation- reviewed in detail today,  See PACEART report  Assessment and Plan:

## 2011-06-24 NOTE — Patient Instructions (Addendum)
Remote monitoring is used to monitor your Pacemaker of ICD from home. This monitoring reduces the number of office visits required to check your device to one time per year. It allows us to keep an eye on the functioning of your device to ensure it is working properly. You are scheduled for a device check from home on Sep 26, 2011. You may send your transmission at any time that day. If you have a wireless device, the transmission will be sent automatically. After your physician reviews your transmission, you will receive a postcard with your next transmission date.   Your physician wants you to follow-up in: 12 months with Dr Allred You will receive a reminder letter in the mail two months in advance. If you don't receive a letter, please call our office to schedule the follow-up appointment.   

## 2011-06-24 NOTE — Assessment & Plan Note (Signed)
No ischemic symptoms Continue medical therapy 

## 2011-06-24 NOTE — Assessment & Plan Note (Signed)
Normal ICD function See Pace Art report No changes today  Continue amiodarone 200mg  daily and coreg Recent LFTs and TFTs were normal PCP to follow LFTs and TFTs twice per year

## 2011-07-04 ENCOUNTER — Encounter: Payer: Self-pay | Admitting: Cardiology

## 2011-07-18 ENCOUNTER — Encounter (INDEPENDENT_AMBULATORY_CARE_PROVIDER_SITE_OTHER): Payer: Medicare Other | Admitting: Vascular Surgery

## 2011-07-18 DIAGNOSIS — I739 Peripheral vascular disease, unspecified: Secondary | ICD-10-CM | POA: Diagnosis not present

## 2011-07-18 DIAGNOSIS — Z48812 Encounter for surgical aftercare following surgery on the circulatory system: Secondary | ICD-10-CM

## 2011-07-24 ENCOUNTER — Encounter: Payer: Self-pay | Admitting: Vascular Surgery

## 2011-07-24 ENCOUNTER — Other Ambulatory Visit: Payer: Self-pay | Admitting: *Deleted

## 2011-07-24 DIAGNOSIS — I739 Peripheral vascular disease, unspecified: Secondary | ICD-10-CM

## 2011-07-24 DIAGNOSIS — Z48812 Encounter for surgical aftercare following surgery on the circulatory system: Secondary | ICD-10-CM

## 2011-07-25 ENCOUNTER — Other Ambulatory Visit: Payer: Self-pay | Admitting: Vascular Surgery

## 2011-07-25 DIAGNOSIS — I739 Peripheral vascular disease, unspecified: Secondary | ICD-10-CM

## 2011-07-25 DIAGNOSIS — Z48812 Encounter for surgical aftercare following surgery on the circulatory system: Secondary | ICD-10-CM

## 2011-08-19 ENCOUNTER — Telehealth: Payer: Self-pay | Admitting: Cardiology

## 2011-08-19 ENCOUNTER — Other Ambulatory Visit: Payer: Self-pay | Admitting: *Deleted

## 2011-08-19 DIAGNOSIS — E785 Hyperlipidemia, unspecified: Secondary | ICD-10-CM

## 2011-08-19 MED ORDER — VALSARTAN-HYDROCHLOROTHIAZIDE 160-12.5 MG PO TABS: ORAL_TABLET | ORAL | Status: DC

## 2011-08-19 MED ORDER — SIMVASTATIN 80 MG PO TABS: 80.0000 mg | ORAL_TABLET | Freq: Every day | ORAL | Status: DC

## 2011-08-19 NOTE — Telephone Encounter (Signed)
Pt has questions re more prescriptions, pls call asap

## 2011-08-19 NOTE — Telephone Encounter (Signed)
Needs torsemide, cannot be replaced with HCT.  He can try the valsartan/HCT but will need to get a BMET after 2 wks.  Could he try a different pharmacy that could get him valsartan without HCT?

## 2011-08-19 NOTE — Telephone Encounter (Signed)
dalsartan 80mg  plus water pill witch is a generic for diovan and it is a combo drug it was written wrong it needs to be written for 80/12.5 mg and it needs to be a 90day supply and he needs this done asap please fax to [facility] and please call pt with confirmation by 12 noon or he will be calling back

## 2011-08-19 NOTE — Telephone Encounter (Signed)
Per Dr Shirlee Latch. Pt will take valsartan/HCT 160/12.5mg  one-half bid instead of Diovan 80mg  bid. I will mail pt an order for a BMET to be done in 2 weeks and results faxed to Dr Shirlee Latch. Pt states that he has taken atorvastatin in the past. He was unable to tolerate atorvastatin because of muscle pain. Pt states he also has not tolerated crestor in the past because of muscle aches. Dr Shirlee Latch recommended pt take simvastatin 80mg  daily since  vytorin is too expensive and he cannot tolerate atorvastatin and crestor. Pt will have fasting lipid/liver profile 12/02/11 at [facility] office at the time of PFT already scheduled.

## 2011-08-19 NOTE — Telephone Encounter (Signed)
Pt states simvastatin 80mg  daily was the does of simvastatin he was on prior to vytorin 10/80.

## 2011-08-19 NOTE — Telephone Encounter (Signed)
Spoke with pt. Dr Shirlee Latch prescribed valsartan 80mg  instead of diovan 80mg  due to cost of diovan.  Pt states his pharmacy states  valsartan 80mg  is not available without HCT added. His pharmacy is recommending that he take valsartan 80/12.5mg  bid in the place of diovan 80mg  bid and torsemide 10mg  daily. I will review with Dr Shirlee Latch.

## 2011-08-20 DIAGNOSIS — L738 Other specified follicular disorders: Secondary | ICD-10-CM | POA: Diagnosis not present

## 2011-08-20 DIAGNOSIS — L570 Actinic keratosis: Secondary | ICD-10-CM | POA: Diagnosis not present

## 2011-08-20 DIAGNOSIS — L821 Other seborrheic keratosis: Secondary | ICD-10-CM | POA: Diagnosis not present

## 2011-08-20 DIAGNOSIS — D1801 Hemangioma of skin and subcutaneous tissue: Secondary | ICD-10-CM | POA: Diagnosis not present

## 2011-08-30 ENCOUNTER — Telehealth: Payer: Self-pay | Admitting: Cardiology

## 2011-08-30 NOTE — Telephone Encounter (Signed)
Patient states he started taken Valsartan/HCT 160/12.5 mg, 1/2 tablet twice a day  on Sunday April 21 st 2013 and continue taken Torsemide 10 mg daily. Patient states yesterday when he was walking in [employer] he got lightheaded, had no energy and had some nausea, he was not feeling well. Today pt has the same symptoms. He would like to know if he can take only 1/2 tablet of Torsemide (5 mg daily) or forget the Valsartan/HCT and  go back to take Diovan 80 mg twice a day. Patient is voiding the same or less then before he started the Vasartan/HCT, and think that it may be the combination of the two medications that is making him to have these symptoms.

## 2011-08-30 NOTE — Telephone Encounter (Signed)
Please return call to patient at [phone], concerning possible medication reaction.  Patient c/o fatigue, nausea

## 2011-09-01 NOTE — Telephone Encounter (Signed)
HCTZ may be lowering his BP some.  Would favor just going back to the Diovan 80 mg bid as he was doing well with that.

## 2011-09-02 MED ORDER — VALSARTAN 80 MG PO TABS: 80.0000 mg | ORAL_TABLET | Freq: Every day | ORAL | Status: DC

## 2011-09-02 NOTE — Telephone Encounter (Signed)
Dr Shirlee Latch OK with no BMET

## 2011-09-02 NOTE — Telephone Encounter (Signed)
Spoke with pt. Pt will stop Valsartan/HCT and start diovan 80mg  bid. He will continue torsemide 10mg  daily. He will not have BMET done soon since he only took Valsartan/HCT for 4 days.

## 2011-09-16 DIAGNOSIS — N402 Nodular prostate without lower urinary tract symptoms: Secondary | ICD-10-CM | POA: Diagnosis not present

## 2011-09-16 DIAGNOSIS — N390 Urinary tract infection, site not specified: Secondary | ICD-10-CM | POA: Diagnosis not present

## 2011-09-26 ENCOUNTER — Encounter: Payer: Self-pay | Admitting: Internal Medicine

## 2011-09-26 ENCOUNTER — Ambulatory Visit (INDEPENDENT_AMBULATORY_CARE_PROVIDER_SITE_OTHER): Payer: Medicare Other | Admitting: *Deleted

## 2011-09-26 DIAGNOSIS — I4729 Other ventricular tachycardia: Secondary | ICD-10-CM | POA: Diagnosis not present

## 2011-09-26 DIAGNOSIS — I472 Ventricular tachycardia: Secondary | ICD-10-CM

## 2011-09-26 DIAGNOSIS — R0602 Shortness of breath: Secondary | ICD-10-CM

## 2011-09-26 DIAGNOSIS — I2589 Other forms of chronic ischemic heart disease: Secondary | ICD-10-CM

## 2011-09-27 LAB — REMOTE ICD DEVICE
BAMS-0001: 170 {beats}/min
BATTERY VOLTAGE: 3.0682 V
CHARGE TIME: 8.868 s
DEV-0020ICD: NEGATIVE
PACEART VT: 0
TZAT-0001ATACH: 1
TZAT-0001FASTVT: 1
TZAT-0001SLOWVT: 2
TZAT-0001SLOWVT: 3
TZAT-0002ATACH: NEGATIVE
TZAT-0004SLOWVT: 8
TZAT-0005SLOWVT: 78 pct
TZAT-0005SLOWVT: 81 pct
TZAT-0011SLOWVT: 10 ms
TZAT-0012ATACH: 150 ms
TZAT-0012ATACH: 150 ms
TZAT-0012SLOWVT: 200 ms
TZAT-0013SLOWVT: 2
TZAT-0018ATACH: NEGATIVE
TZAT-0018SLOWVT: NEGATIVE
TZAT-0018SLOWVT: NEGATIVE
TZAT-0018SLOWVT: NEGATIVE
TZAT-0019ATACH: 6 V
TZAT-0019SLOWVT: 8 V
TZAT-0019SLOWVT: 8 V
TZAT-0020ATACH: 1.5 ms
TZAT-0020ATACH: 1.5 ms
TZAT-0020ATACH: 1.5 ms
TZON-0003ATACH: 350 ms
TZON-0003SLOWVT: 470 ms
TZON-0004SLOWVT: 28
TZST-0001ATACH: 4
TZST-0001FASTVT: 2
TZST-0001FASTVT: 4
TZST-0001SLOWVT: 4
TZST-0001SLOWVT: 6
TZST-0002ATACH: NEGATIVE
TZST-0002FASTVT: NEGATIVE
TZST-0002FASTVT: NEGATIVE
TZST-0003SLOWVT: 35 J
VENTRICULAR PACING ICD: 0.02 pct
VF: 0

## 2011-09-30 DIAGNOSIS — N402 Nodular prostate without lower urinary tract symptoms: Secondary | ICD-10-CM | POA: Diagnosis not present

## 2011-10-08 DIAGNOSIS — N400 Enlarged prostate without lower urinary tract symptoms: Secondary | ICD-10-CM | POA: Diagnosis not present

## 2011-10-08 DIAGNOSIS — N402 Nodular prostate without lower urinary tract symptoms: Secondary | ICD-10-CM | POA: Diagnosis not present

## 2011-10-18 ENCOUNTER — Encounter: Payer: Self-pay | Admitting: *Deleted

## 2011-11-19 DIAGNOSIS — Z5181 Encounter for therapeutic drug level monitoring: Secondary | ICD-10-CM | POA: Diagnosis not present

## 2011-11-19 DIAGNOSIS — Z79899 Other long term (current) drug therapy: Secondary | ICD-10-CM | POA: Diagnosis not present

## 2011-11-19 DIAGNOSIS — M069 Rheumatoid arthritis, unspecified: Secondary | ICD-10-CM | POA: Diagnosis not present

## 2011-12-02 ENCOUNTER — Encounter: Payer: Self-pay | Admitting: Cardiology

## 2011-12-02 ENCOUNTER — Ambulatory Visit (INDEPENDENT_AMBULATORY_CARE_PROVIDER_SITE_OTHER): Payer: Medicare Other | Admitting: Cardiology

## 2011-12-02 ENCOUNTER — Other Ambulatory Visit: Payer: Medicare Other

## 2011-12-02 ENCOUNTER — Other Ambulatory Visit (INDEPENDENT_AMBULATORY_CARE_PROVIDER_SITE_OTHER): Payer: Medicare Other

## 2011-12-02 ENCOUNTER — Ambulatory Visit: Payer: Medicare Other | Admitting: Cardiology

## 2011-12-02 ENCOUNTER — Ambulatory Visit (INDEPENDENT_AMBULATORY_CARE_PROVIDER_SITE_OTHER): Payer: Medicare Other | Admitting: Internal Medicine

## 2011-12-02 VITALS — BP 106/72 | HR 76 | Resp 16 | Ht 70.0 in | Wt 207.0 lb

## 2011-12-02 DIAGNOSIS — I2589 Other forms of chronic ischemic heart disease: Secondary | ICD-10-CM

## 2011-12-02 DIAGNOSIS — I472 Ventricular tachycardia: Secondary | ICD-10-CM

## 2011-12-02 DIAGNOSIS — I4729 Other ventricular tachycardia: Secondary | ICD-10-CM

## 2011-12-02 DIAGNOSIS — Z79899 Other long term (current) drug therapy: Secondary | ICD-10-CM

## 2011-12-02 DIAGNOSIS — I2510 Atherosclerotic heart disease of native coronary artery without angina pectoris: Secondary | ICD-10-CM

## 2011-12-02 DIAGNOSIS — M069 Rheumatoid arthritis, unspecified: Secondary | ICD-10-CM | POA: Diagnosis not present

## 2011-12-02 DIAGNOSIS — M545 Low back pain, unspecified: Secondary | ICD-10-CM | POA: Diagnosis not present

## 2011-12-02 DIAGNOSIS — M79609 Pain in unspecified limb: Secondary | ICD-10-CM | POA: Diagnosis not present

## 2011-12-02 DIAGNOSIS — E785 Hyperlipidemia, unspecified: Secondary | ICD-10-CM

## 2011-12-02 LAB — HEPATIC FUNCTION PANEL
ALT: 19 U/L (ref 0–53)
Bilirubin, Direct: 0 mg/dL (ref 0.0–0.3)
Total Protein: 6.9 g/dL (ref 6.0–8.3)

## 2011-12-02 LAB — PULMONARY FUNCTION TEST

## 2011-12-02 LAB — LIPID PANEL
Total CHOL/HDL Ratio: 3
Triglycerides: 105 mg/dL (ref 0.0–149.0)

## 2011-12-02 MED ORDER — TORSEMIDE 20 MG PO TABS: 20.0000 mg | ORAL_TABLET | Freq: Every day | ORAL | Status: DC

## 2011-12-02 MED ORDER — SIMVASTATIN 80 MG PO TABS: 80.0000 mg | ORAL_TABLET | Freq: Every day | ORAL | Status: DC

## 2011-12-02 NOTE — Progress Notes (Signed)
Patient ID: Nathan Pruitt, male   DOB: 1946-04-05, 66 y.o.   MRN: [id number] PCP: Dr. Charlott Rakes  66 yo with history of CAD, ischemic CMP, and VT s/p ICD placement returns for followup.  He is on amiodarone and has had no recurrent VT.   Last echo in 7/12 showed EF 45-50%, mild LVH, akinesis of the basal to mid inferoposterior wall.  Since last appointment, weight is down 7 lbs.  However, he has noted that if he does heavy yardwork, he will get exhausted and short of breath.  He is short of breath walking up his hilly driveway.  No chest pain.  No orthopnea/PND.  Optivol was checked today and showed significant decrease in thoracic impedance.  Fluid index was above threshold.   Labs (11/10): K 4.9, creatinine 1.4, LDL 70, HDL 31, LFTs normal Labs (4/09): K 4.4, creatinine 1.3, LDL 61, HDL 35 Labs (7/11): LDL 62, HDL 23 Labs (1/12): K 4.8, LFTs normal, creatinine 1.44, LDL 69, HDL 33 Labs (7/12): LFTs normal, LDL 81, HDL 36, HCT normal, K 4.3, creatinine 1.3, BNP 116, TSH normal Labs (1/13): LDL 39, HDl 31, LFTs normal, TSH   Allergies (verified):  No Known Drug Allergies  Past Medical History: 1. Coronary artery disease.       a.     The patient reports history of silent MI in 1993.  This was likely an inferior MI (see below).       b.     The patient reports history of 2-D echocardiogram in March        2009, showing an EF of 40%.       c.     The patient reports history of Myoview in [facility] in March 2009, which        was per his report normal.       d.     The patient presented to [facility] in 5/10 with VT and mildly elevated cardiac enzymes.         LHC (5/10):  Inferobasal dyskinesis with EF 35-40%.  There was chronic total occlusion of the         mid RCA with good collaterals.  Luminals LCA.  This did not appear to be an acute cause of the             5/10 event.  2. Hypertension.  3. Hyperlipidemia.  4. Remote tobacco abuse with 47-pack-year history, quitting in August  2009.  5. Peripheral arterial disease.       a.     Status post left-to-right fem-fem bypass performed in        [place] in March 2009.       b.     Status post redo left-to-right fem-fem bypass performed by        Dr. Edilia Bo at [facility] in 2009.  6. Rheumatoid arthritis, on leflunomide.  7.  Ischemic cardiomyopathy:  EF 35-40% by LV-gram 5/10 with inferobasal dyskinesis.  Echo 5/10 showed EF 40% with mild LVH, no significant MR, inferobasal and posterobasal akinesis.  Echo (7/11): EF 50%, mild LVH, basal-mid inferoposterior akinesis.  Echo (7/12): EF 45-50% with basal anterolateral, basal posterior, and basal to mid inferior akinesis.   8.  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  He has a dual chamber Medtronic ICD.  9.  Atrial flutter:  Status post isthmus ablation 5/10.   10.  PFTs (7/11): FVC 74%, FEV1  80%, ratio 75%, TLC 78%, DLCO 68%.  This suggests a mild restrictive defect and a mild obstructive defect.  He did have response to bronchodilator. These PFTs were significantly better than the report from Dr. Blenda Nicely in [place] done prior.  He had last PFTs in 2/12 with no significant change.  11.  Anxiety 12.  Chronic cough: No relief with change from ACEI to ARB or with trial of PPI.   Family History: Mother died in her late 46s with gastric cancer.  Father died in his late 57s with throat cancer and PVD.   He had a brother who died at 45 of an MI.   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 -47ppy hx, quit 2009.  Alcohol Use - yes-minimal Regular Exercise - yes Drug Use - no (prior)  Review of Systems        All systems reviewed and negative except as per HPI.   Current Outpatient Prescriptions  Medication Sig Dispense Refill  . amiodarone (PACERONE) 200 MG tablet Take 200 mg by mouth daily.       [name] aspirin EC 81 MG tablet Take 1 tablet (81 mg total) by  mouth daily.      . carvedilol (COREG) 6.25 MG tablet TAKE 1 TABLET TWICE A DAY  180 tablet  3  . cyclobenzaprine (FLEXERIL) 5 MG tablet Take 5 mg by mouth as needed.      . leflunomide (ARAVA) 20 MG tablet Take 20 mg by mouth daily.        . simvastatin (ZOCOR) 80 MG tablet Take 1 tablet (80 mg total) by mouth at bedtime.  90 tablet  1  . torsemide (DEMADEX) 20 MG tablet Take 1 tablet (20 mg total) by mouth daily.  90 tablet  3  . valsartan (DIOVAN) 80 MG tablet Take 80 mg by mouth.      . DISCONTD: simvastatin (ZOCOR) 80 MG tablet Take 1 tablet (80 mg total) by mouth at bedtime.  90 tablet  1  . DISCONTD: torsemide (DEMADEX) 10 MG tablet Take 1 tablet (10 mg total) by mouth daily.  90 tablet  3  . DISCONTD: valsartan (DIOVAN) 80 MG tablet Take 1 tablet (80 mg total) by mouth daily.  60 tablet  6    BP 106/72  Pulse 76  Resp 16  Ht 5\' 10"  (1.778 m)  Wt 207 lb (93.895 kg)  BMI 29.70 kg/m2 General:  Well developed, well nourished, in no acute distress. Neck:  Neck supple, no JVD. No masses, thyromegaly or abnormal cervical nodes. Lungs:  Rhonchi L>R Heart:  Non-displaced PMI, chest non-tender; regular rate and rhythm, S1, S2 without murmurs, rubs or gallops. Carotid upstroke normal, no bruit. Normal abdominal aortic size, no bruits. Femorals normal pulses, no bruits. Pedals normal pulses. No edema, no varicosities. Abdomen:  Bowel sounds positive; abdomen soft and non-tender without masses, organomegaly, or hernias noted. No hepatosplenomegaly. Extremities:  No clubbing or cyanosis. Neurologic:  Alert and oriented x 3. Psych:  Normal affect.

## 2011-12-02 NOTE — Assessment & Plan Note (Signed)
Patient does not appear volume overloaded on exam but has been more symptomatic recently with NYHA class II-III dyspnea.  Optivol shows decreased thoracic impedance suggesting fluid accumulation, which fits his symptoms.  Last EF by echo was 45-50%.   - Increase torsemide to 20 mg daily with BMET in 2 wks.  - Continue current Coreg, valsartan.

## 2011-12-02 NOTE — Assessment & Plan Note (Signed)
Continue amiodarone.  Also has ICD.  Check LFTs, TSH today.  Had PFTs today as well.

## 2011-12-02 NOTE — Assessment & Plan Note (Signed)
Check lipids today with goal LDL < 70.  He has been on simvastatin 80 mg daily > 1 year with no side effects.

## 2011-12-02 NOTE — Patient Instructions (Addendum)
Your physician recommends that you schedule a follow-up appointment in: 3 months.  Your physician has recommended you make the following change in your medication: INCREASE your Torsemide to 20mg  daily  Your physician recommends that you return for lab work in: two weeks (bmet) at [facility]  Your physician recommends that you schedule a follow-up appointment in: 3 months.

## 2011-12-02 NOTE — Assessment & Plan Note (Signed)
No chest pain.  Continue ASA, statin, Coreg, ACEI.

## 2011-12-02 NOTE — Progress Notes (Signed)
PFT done today. 

## 2011-12-10 ENCOUNTER — Telehealth: Payer: Self-pay | Admitting: *Deleted

## 2011-12-10 NOTE — Telephone Encounter (Signed)
Dr Shirlee Latch reviewed PFT done 12/02/11--stable PFTs.  Pt notified.

## 2011-12-18 ENCOUNTER — Encounter: Payer: Self-pay | Admitting: Cardiology

## 2011-12-18 DIAGNOSIS — I2510 Atherosclerotic heart disease of native coronary artery without angina pectoris: Secondary | ICD-10-CM | POA: Diagnosis not present

## 2011-12-18 DIAGNOSIS — M069 Rheumatoid arthritis, unspecified: Secondary | ICD-10-CM | POA: Diagnosis not present

## 2011-12-18 DIAGNOSIS — M25559 Pain in unspecified hip: Secondary | ICD-10-CM | POA: Diagnosis not present

## 2012-01-02 ENCOUNTER — Encounter: Payer: Self-pay | Admitting: Internal Medicine

## 2012-01-02 ENCOUNTER — Ambulatory Visit (INDEPENDENT_AMBULATORY_CARE_PROVIDER_SITE_OTHER): Payer: Medicare Other | Admitting: *Deleted

## 2012-01-02 DIAGNOSIS — R0602 Shortness of breath: Secondary | ICD-10-CM

## 2012-01-02 DIAGNOSIS — I4729 Other ventricular tachycardia: Secondary | ICD-10-CM | POA: Diagnosis not present

## 2012-01-02 DIAGNOSIS — I472 Ventricular tachycardia: Secondary | ICD-10-CM | POA: Diagnosis not present

## 2012-01-03 LAB — REMOTE ICD DEVICE
AL AMPLITUDE: 2 mv
AL IMPEDENCE ICD: 418 Ohm
ATRIAL PACING ICD: 0.08 pct
BAMS-0001: 170 {beats}/min
BATTERY VOLTAGE: 3.0899 V
CHARGE TIME: 8.868 s
RV LEAD IMPEDENCE ICD: 475 Ohm
TOT-0002: 1
TOT-0006: 20100520000000
TZAT-0001ATACH: 1
TZAT-0001FASTVT: 1
TZAT-0001SLOWVT: 1
TZAT-0001SLOWVT: 2
TZAT-0001SLOWVT: 3
TZAT-0002ATACH: NEGATIVE
TZAT-0002ATACH: NEGATIVE
TZAT-0002FASTVT: NEGATIVE
TZAT-0004SLOWVT: 8
TZAT-0004SLOWVT: 8
TZAT-0004SLOWVT: 8
TZAT-0005SLOWVT: 78 pct
TZAT-0012FASTVT: 200 ms
TZAT-0013SLOWVT: 2
TZAT-0013SLOWVT: 2
TZAT-0013SLOWVT: 2
TZAT-0018ATACH: NEGATIVE
TZAT-0018FASTVT: NEGATIVE
TZAT-0019ATACH: 6 V
TZAT-0019ATACH: 6 V
TZAT-0019FASTVT: 8 V
TZAT-0020ATACH: 1.5 ms
TZAT-0020SLOWVT: 1.5 ms
TZAT-0020SLOWVT: 1.5 ms
TZAT-0020SLOWVT: 1.5 ms
TZST-0001ATACH: 5
TZST-0001FASTVT: 4
TZST-0001FASTVT: 5
TZST-0001FASTVT: 6
TZST-0001SLOWVT: 4
TZST-0001SLOWVT: 6
TZST-0002ATACH: NEGATIVE
TZST-0002FASTVT: NEGATIVE
TZST-0002FASTVT: NEGATIVE
TZST-0002FASTVT: NEGATIVE
TZST-0003SLOWVT: 35 J
VENTRICULAR PACING ICD: 0 pct
VF: 0

## 2012-01-07 ENCOUNTER — Encounter: Payer: Self-pay | Admitting: *Deleted

## 2012-02-19 DIAGNOSIS — L821 Other seborrheic keratosis: Secondary | ICD-10-CM | POA: Diagnosis not present

## 2012-02-19 DIAGNOSIS — L570 Actinic keratosis: Secondary | ICD-10-CM | POA: Diagnosis not present

## 2012-02-19 DIAGNOSIS — C44519 Basal cell carcinoma of skin of other part of trunk: Secondary | ICD-10-CM | POA: Diagnosis not present

## 2012-03-04 DIAGNOSIS — C44519 Basal cell carcinoma of skin of other part of trunk: Secondary | ICD-10-CM | POA: Diagnosis not present

## 2012-03-05 ENCOUNTER — Ambulatory Visit (INDEPENDENT_AMBULATORY_CARE_PROVIDER_SITE_OTHER): Payer: Medicare Other | Admitting: Cardiology

## 2012-03-05 ENCOUNTER — Encounter: Payer: Self-pay | Admitting: Cardiology

## 2012-03-05 VITALS — BP 104/70 | HR 51 | Ht 70.0 in | Wt 216.0 lb

## 2012-03-05 DIAGNOSIS — I2510 Atherosclerotic heart disease of native coronary artery without angina pectoris: Secondary | ICD-10-CM | POA: Diagnosis not present

## 2012-03-05 DIAGNOSIS — I472 Ventricular tachycardia: Secondary | ICD-10-CM

## 2012-03-05 DIAGNOSIS — E785 Hyperlipidemia, unspecified: Secondary | ICD-10-CM

## 2012-03-05 DIAGNOSIS — R0602 Shortness of breath: Secondary | ICD-10-CM | POA: Diagnosis not present

## 2012-03-05 DIAGNOSIS — I5022 Chronic systolic (congestive) heart failure: Secondary | ICD-10-CM

## 2012-03-05 DIAGNOSIS — I2589 Other forms of chronic ischemic heart disease: Secondary | ICD-10-CM

## 2012-03-05 DIAGNOSIS — I4729 Other ventricular tachycardia: Secondary | ICD-10-CM | POA: Diagnosis not present

## 2012-03-05 LAB — BASIC METABOLIC PANEL
CO2: 29 mEq/L (ref 19–32)
Calcium: 8.5 mg/dL (ref 8.4–10.5)
Creatinine, Ser: 1.4 mg/dL (ref 0.4–1.5)
Glucose, Bld: 100 mg/dL — ABNORMAL HIGH (ref 70–99)
Sodium: 139 mEq/L (ref 135–145)

## 2012-03-05 NOTE — Patient Instructions (Addendum)
Your physician recommends that you have  lab work today--BMET/BNP.  Weight daily with the same amount of clothes and at the same time of day. It your weight increases by 2 pounds in 24 hours or 3 pounds in 1 week, increase your torsemide to 40mg  daily for 3 days. Go back to 20mg  daily after 3 days.   Your physician recommends that you schedule a follow-up appointment in: 3 months with Dr Shirlee Latch.

## 2012-03-05 NOTE — Progress Notes (Signed)
Patient ID: Nathan Pruitt, male   DOB: [birth year], 66 y.o.   MRN: [id number] PCP: Dr. Charlott Rakes  66 yo with history of CAD, ischemic CMP, and VT s/p ICD placement returns for followup.  He is on amiodarone and has had no recurrent VT.   Last echo in 7/12 showed EF 45-50%, mild LVH, akinesis of the basal to mid inferoposterior wall.  At last appointment, Optivol fluid index was noted to be high, and torsemide was increased to 20 mg daily.  He has been stable since then.  He is not very active.  No chest pain.  Weight is up but he is also wearing heavy clothes.  He is still short of breath with heavier activity such as leaf-blowing or walking up a hill.  Optivol was checked today.  Though his fluid index is below threshold, impedance was trending downwards.    Labs (11/10): K 4.9, creatinine 1.4, LDL 70, HDL 31, LFTs normal Labs (1/30): K 4.4, creatinine 1.3, LDL 61, HDL 35 Labs (7/11): LDL 62, HDL 23 Labs (1/12): K 4.8, LFTs normal, creatinine 1.44, LDL 69, HDL 33 Labs (7/12): LFTs normal, LDL 81, HDL 36, HCT normal, K 4.3, creatinine 1.3, BNP 116, TSH normal Labs (1/13): LDL 39, HDl 31, LFTs normal, TSH  Labs (7/13): TSH normal, LFTs normal, LDL 69, HDL 40 Labs (8/13): K 4.5, creatinine 1.6  Allergies (verified):  No Known Drug Allergies  Past Medical History: 1. Coronary artery disease.       a.     The patient reports history of silent MI in 1993.  This was likely an inferior MI (see below).       b.     The patient reports history of 2-D echocardiogram in March        2009, showing an EF of 40%.       c.     The patient reports history of Myoview in [facility] in March 2009, which        was per his report normal.       d.     The patient presented to [facility] in 5/10 with VT and mildly elevated cardiac enzymes.         LHC (5/10):  Inferobasal dyskinesis with EF 35-40%.  There was chronic total occlusion of the         mid RCA with good collaterals.  Luminals LCA.  This did  not appear to be an acute cause of the             5/10 event.  2. Hypertension.  3. Hyperlipidemia.  4. Remote tobacco abuse with 47-pack-year history, quitting in August 2009.  5. Peripheral arterial disease.       a.     Status post left-to-right fem-fem bypass performed in        [place] in March 2009.       b.     Status post redo left-to-right fem-fem bypass performed by        Dr. Edilia Bo at [facility] in 2009.  6. Rheumatoid arthritis, on leflunomide.  7.  Ischemic cardiomyopathy:  EF 35-40% by LV-gram 5/10 with inferobasal dyskinesis.  Echo 5/10 showed EF 40% with mild LVH, no significant MR, inferobasal and posterobasal akinesis.  Echo (7/11): EF 50%, mild LVH, basal-mid inferoposterior akinesis.  Echo (7/12): EF 45-50% with basal anterolateral, basal posterior, and basal to mid inferior akinesis.   8.  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  He has a dual chamber Medtronic ICD.  9.  Atrial flutter:  Status post isthmus ablation 5/10.   10.  PFTs (7/11): FVC 74%, FEV1 80%, ratio 75%, TLC 78%, DLCO 68%.  This suggests a mild restrictive defect and a mild obstructive defect.  He did have response to bronchodilator. These PFTs were significantly better than the report from Dr. Blenda Nicely in [place] done prior.  He had last PFTs in 2/12 with no significant change.  11.  Anxiety 12.  Chronic cough: No relief with change from ACEI to ARB or with trial of PPI.   Family History: Mother died in her late 99s with gastric cancer.  Father died in his late 78s with throat cancer and PVD.   He had a brother who died at 47 of an MI.   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 -47ppy hx, quit 2009.  Alcohol Use - yes-minimal Regular Exercise - yes Drug Use - no (prior)  Review of Systems        All systems reviewed and negative except as per HPI.   Current Outpatient  Prescriptions  Medication Sig Dispense Refill  . amiodarone (PACERONE) 200 MG tablet Take 200 mg by mouth daily.       [name] aspirin EC 81 MG tablet Take 1 tablet (81 mg total) by mouth daily.      . carvedilol (COREG) 6.25 MG tablet TAKE 1 TABLET TWICE A DAY  180 tablet  3  . cyclobenzaprine (FLEXERIL) 5 MG tablet Take 5 mg by mouth as needed.      . leflunomide (ARAVA) 20 MG tablet Take 20 mg by mouth daily.        . simvastatin (ZOCOR) 80 MG tablet Take 1 tablet (80 mg total) by mouth at bedtime.  90 tablet  1  . torsemide (DEMADEX) 20 MG tablet Take 1 tablet (20 mg total) by mouth daily.  90 tablet  3  . valsartan (DIOVAN) 80 MG tablet Take 80 mg by mouth.        BP 104/70  Pulse 51  Ht 5\' 10"  (1.778 m)  Wt 216 lb (97.977 kg)  BMI 30.99 kg/m2 General:  Well developed, well nourished, in no acute distress. Neck:  Neck supple, no JVD. No masses, thyromegaly or abnormal cervical nodes. Lungs:  Slightly decreased breath sounds bilaterally.  Heart:  Non-displaced PMI, chest non-tender; regular rate and rhythm, S1, S2 without murmurs, rubs or gallops. Carotid upstroke normal, no bruit. Normal abdominal aortic size, no bruits. Femorals normal pulses, no bruits. Pedals normal pulses. No edema, no varicosities. Abdomen:  Bowel sounds positive; abdomen soft and non-tender without masses, organomegaly, or hernias noted. No hepatosplenomegaly. Extremities:  No clubbing or cyanosis. Neurologic:  Alert and oriented x 3. Psych:  Normal affect.  Assessment/Plan:  CARDIOMYOPATHY, ISCHEMIC Patient does not appear volume overloaded on exam and has stable NYHA class II-III dyspnea. Optivol shows decreasing thoracic impedance suggesting fluid is beginning to accumulate again, but fluid index is still below threshold.  Last EF by echo was 45-50%.  - Follow weight carefully daily.  If he gains 2 lbs in a day or 3 lbs in a week, he will take torsemide 40 mg daily x 3 days then go back to 20 mg daily.  -  Continue current Coreg, valsartan.  - Needs to watch sodium in his diet.  - Check BMET/BNP today.  CAD, NATIVE VESSEL No chest pain. Continue ASA, statin, Coreg, ACEI.  HYPERLIPIDEMIA  Lipids have been at goal (LDL < 70). He has been on simvastatin 80 mg daily > 1 year with no side effects.  VENTRICULAR TACHYCARDIA  Continue amiodarone. Also has ICD. LFTs/TSH recently normal, he has done baseline PFTs.   Marca Ancona 03/05/2012

## 2012-03-09 ENCOUNTER — Encounter: Payer: Self-pay | Admitting: Internal Medicine

## 2012-03-24 DIAGNOSIS — L820 Inflamed seborrheic keratosis: Secondary | ICD-10-CM | POA: Diagnosis not present

## 2012-03-24 DIAGNOSIS — L570 Actinic keratosis: Secondary | ICD-10-CM | POA: Diagnosis not present

## 2012-03-24 DIAGNOSIS — C44519 Basal cell carcinoma of skin of other part of trunk: Secondary | ICD-10-CM | POA: Diagnosis not present

## 2012-04-06 ENCOUNTER — Ambulatory Visit (INDEPENDENT_AMBULATORY_CARE_PROVIDER_SITE_OTHER): Payer: Medicare Other | Admitting: *Deleted

## 2012-04-06 ENCOUNTER — Encounter: Payer: Self-pay | Admitting: Internal Medicine

## 2012-04-06 DIAGNOSIS — I2589 Other forms of chronic ischemic heart disease: Secondary | ICD-10-CM | POA: Diagnosis not present

## 2012-04-06 DIAGNOSIS — R0602 Shortness of breath: Secondary | ICD-10-CM

## 2012-04-06 DIAGNOSIS — N402 Nodular prostate without lower urinary tract symptoms: Secondary | ICD-10-CM | POA: Diagnosis not present

## 2012-04-06 DIAGNOSIS — Z23 Encounter for immunization: Secondary | ICD-10-CM | POA: Diagnosis not present

## 2012-04-06 DIAGNOSIS — R351 Nocturia: Secondary | ICD-10-CM | POA: Diagnosis not present

## 2012-04-06 DIAGNOSIS — Z95810 Presence of automatic (implantable) cardiac defibrillator: Secondary | ICD-10-CM | POA: Diagnosis not present

## 2012-04-06 DIAGNOSIS — N390 Urinary tract infection, site not specified: Secondary | ICD-10-CM | POA: Diagnosis not present

## 2012-04-13 LAB — REMOTE ICD DEVICE
AL IMPEDENCE ICD: 418 Ohm
ATRIAL PACING ICD: 1.43 pct
PACEART VT: 0
TOT-0006: 20100520000000
TZAT-0001ATACH: 2
TZAT-0001FASTVT: 1
TZAT-0001SLOWVT: 1
TZAT-0001SLOWVT: 2
TZAT-0001SLOWVT: 3
TZAT-0002ATACH: NEGATIVE
TZAT-0002ATACH: NEGATIVE
TZAT-0002ATACH: NEGATIVE
TZAT-0002FASTVT: NEGATIVE
TZAT-0005SLOWVT: 78 pct
TZAT-0012ATACH: 150 ms
TZAT-0012SLOWVT: 200 ms
TZAT-0012SLOWVT: 200 ms
TZAT-0012SLOWVT: 200 ms
TZAT-0013SLOWVT: 2
TZAT-0013SLOWVT: 2
TZAT-0013SLOWVT: 2
TZAT-0018ATACH: NEGATIVE
TZAT-0018ATACH: NEGATIVE
TZAT-0018FASTVT: NEGATIVE
TZAT-0018SLOWVT: NEGATIVE
TZAT-0018SLOWVT: NEGATIVE
TZAT-0018SLOWVT: NEGATIVE
TZAT-0019ATACH: 6 V
TZAT-0019ATACH: 6 V
TZAT-0019ATACH: 6 V
TZAT-0020ATACH: 1.5 ms
TZAT-0020ATACH: 1.5 ms
TZAT-0020SLOWVT: 1.5 ms
TZAT-0020SLOWVT: 1.5 ms
TZON-0003SLOWVT: 470 ms
TZON-0003VSLOWVT: 450 ms
TZON-0004SLOWVT: 28
TZON-0004VSLOWVT: 20
TZON-0005SLOWVT: 20
TZST-0001ATACH: 5
TZST-0001FASTVT: 3
TZST-0001FASTVT: 4
TZST-0001SLOWVT: 4
TZST-0002ATACH: NEGATIVE
TZST-0002FASTVT: NEGATIVE
TZST-0002FASTVT: NEGATIVE
TZST-0002FASTVT: NEGATIVE
TZST-0003SLOWVT: 35 J
VENTRICULAR PACING ICD: 0.01 pct
VF: 0

## 2012-04-15 ENCOUNTER — Encounter: Payer: Self-pay | Admitting: *Deleted

## 2012-04-24 DIAGNOSIS — R972 Elevated prostate specific antigen [PSA]: Secondary | ICD-10-CM | POA: Diagnosis not present

## 2012-04-24 DIAGNOSIS — N402 Nodular prostate without lower urinary tract symptoms: Secondary | ICD-10-CM | POA: Diagnosis not present

## 2012-04-24 DIAGNOSIS — N400 Enlarged prostate without lower urinary tract symptoms: Secondary | ICD-10-CM | POA: Diagnosis not present

## 2012-04-24 DIAGNOSIS — R351 Nocturia: Secondary | ICD-10-CM | POA: Diagnosis not present

## 2012-05-04 DIAGNOSIS — N402 Nodular prostate without lower urinary tract symptoms: Secondary | ICD-10-CM | POA: Diagnosis not present

## 2012-05-04 DIAGNOSIS — R351 Nocturia: Secondary | ICD-10-CM | POA: Diagnosis not present

## 2012-05-04 DIAGNOSIS — N400 Enlarged prostate without lower urinary tract symptoms: Secondary | ICD-10-CM | POA: Diagnosis not present

## 2012-05-29 DIAGNOSIS — Z79899 Other long term (current) drug therapy: Secondary | ICD-10-CM | POA: Diagnosis not present

## 2012-05-29 DIAGNOSIS — M069 Rheumatoid arthritis, unspecified: Secondary | ICD-10-CM | POA: Diagnosis not present

## 2012-06-03 ENCOUNTER — Telehealth: Payer: Self-pay | Admitting: Cardiology

## 2012-06-03 ENCOUNTER — Ambulatory Visit: Payer: Medicare Other | Admitting: Cardiology

## 2012-06-03 DIAGNOSIS — E785 Hyperlipidemia, unspecified: Secondary | ICD-10-CM

## 2012-06-03 MED ORDER — AMIODARONE HCL 200 MG PO TABS: 200.0000 mg | ORAL_TABLET | Freq: Every day | ORAL | Status: DC

## 2012-06-03 NOTE — Telephone Encounter (Signed)
New Problem    Refill Request Showing possible drug interaction b/w amiodarone and simvastatin. Need permission to fill.

## 2012-06-03 NOTE — Telephone Encounter (Signed)
This has been done.

## 2012-06-03 NOTE — Telephone Encounter (Signed)
Reviewed with Dr Darvin Neighbours recommended pt decrease simvastatin to 40mg  daily. Spoke with Sherrie Sport is aware of Dr Alford Highland recommendations. Pt is aware of Dr Alford Highland recommendations. Pt will have fasting lipid /liver profile a few days prior to 07/07/12 appt with Dr Shirlee Latch. I will mail him an order to have this done at [facility].

## 2012-06-03 NOTE — Telephone Encounter (Signed)
Pt calling re an old rx for amiodarone that has expired, dosage has changed to he thinks to  300mg  from 200mg  once a day 90 days supply with refills , [facility] [address] [facility] [place], pls call if any questions for pt , he is out and needs asap

## 2012-06-23 ENCOUNTER — Other Ambulatory Visit: Payer: Self-pay | Admitting: *Deleted

## 2012-06-23 DIAGNOSIS — I739 Peripheral vascular disease, unspecified: Secondary | ICD-10-CM

## 2012-06-24 DIAGNOSIS — M159 Polyosteoarthritis, unspecified: Secondary | ICD-10-CM | POA: Diagnosis not present

## 2012-06-24 DIAGNOSIS — M545 Low back pain, unspecified: Secondary | ICD-10-CM | POA: Diagnosis not present

## 2012-06-24 DIAGNOSIS — M069 Rheumatoid arthritis, unspecified: Secondary | ICD-10-CM | POA: Diagnosis not present

## 2012-07-07 ENCOUNTER — Ambulatory Visit: Payer: Medicare Other | Admitting: Cardiology

## 2012-07-08 ENCOUNTER — Ambulatory Visit (INDEPENDENT_AMBULATORY_CARE_PROVIDER_SITE_OTHER): Payer: Medicare Other | Admitting: Internal Medicine

## 2012-07-08 ENCOUNTER — Other Ambulatory Visit (INDEPENDENT_AMBULATORY_CARE_PROVIDER_SITE_OTHER): Payer: Medicare Other

## 2012-07-08 ENCOUNTER — Ambulatory Visit: Payer: Medicare Other | Admitting: Neurosurgery

## 2012-07-08 ENCOUNTER — Encounter: Payer: Self-pay | Admitting: Internal Medicine

## 2012-07-08 VITALS — BP 126/60 | HR 58 | Ht 70.5 in | Wt 216.8 lb

## 2012-07-08 DIAGNOSIS — E785 Hyperlipidemia, unspecified: Secondary | ICD-10-CM | POA: Diagnosis not present

## 2012-07-08 DIAGNOSIS — I10 Essential (primary) hypertension: Secondary | ICD-10-CM

## 2012-07-08 DIAGNOSIS — I472 Ventricular tachycardia: Secondary | ICD-10-CM | POA: Diagnosis not present

## 2012-07-08 DIAGNOSIS — R0602 Shortness of breath: Secondary | ICD-10-CM

## 2012-07-08 DIAGNOSIS — I4729 Other ventricular tachycardia: Secondary | ICD-10-CM

## 2012-07-08 DIAGNOSIS — R0989 Other specified symptoms and signs involving the circulatory and respiratory systems: Secondary | ICD-10-CM

## 2012-07-08 DIAGNOSIS — I2589 Other forms of chronic ischemic heart disease: Secondary | ICD-10-CM

## 2012-07-08 LAB — ICD DEVICE OBSERVATION
AL IMPEDENCE ICD: 475 Ohm
CHARGE TIME: 9.028 s
PACEART VT: 0
RV LEAD AMPLITUDE: 19.6 mv
RV LEAD IMPEDENCE ICD: 475 Ohm
TOT-0001: 5
TOT-0002: 1
TOT-0006: 20100520000000
TZAT-0001ATACH: 1
TZAT-0001ATACH: 3
TZAT-0001FASTVT: 1
TZAT-0002ATACH: NEGATIVE
TZAT-0002FASTVT: NEGATIVE
TZAT-0004SLOWVT: 8
TZAT-0004SLOWVT: 8
TZAT-0004SLOWVT: 8
TZAT-0005SLOWVT: 78 pct
TZAT-0005SLOWVT: 81 pct
TZAT-0011SLOWVT: 10 ms
TZAT-0011SLOWVT: 10 ms
TZAT-0011SLOWVT: 10 ms
TZAT-0012ATACH: 150 ms
TZAT-0012ATACH: 150 ms
TZAT-0012SLOWVT: 200 ms
TZAT-0012SLOWVT: 200 ms
TZAT-0012SLOWVT: 200 ms
TZAT-0018ATACH: NEGATIVE
TZAT-0018ATACH: NEGATIVE
TZAT-0018FASTVT: NEGATIVE
TZAT-0019ATACH: 6 V
TZAT-0019SLOWVT: 8 V
TZAT-0019SLOWVT: 8 V
TZAT-0020ATACH: 1.5 ms
TZON-0003ATACH: 350 ms
TZON-0003VSLOWVT: 450 ms
TZST-0001ATACH: 4
TZST-0001ATACH: 5
TZST-0001ATACH: 6
TZST-0001FASTVT: 3
TZST-0001FASTVT: 4
TZST-0001SLOWVT: 5
TZST-0001SLOWVT: 6
TZST-0002ATACH: NEGATIVE
TZST-0002ATACH: NEGATIVE
TZST-0002FASTVT: NEGATIVE
TZST-0002FASTVT: NEGATIVE
TZST-0003SLOWVT: 20 J
TZST-0003SLOWVT: 35 J

## 2012-07-08 LAB — LIPID PANEL
Cholesterol: 167 mg/dL (ref 0–200)
HDL: 29.6 mg/dL — ABNORMAL LOW (ref >39.00)
LDL Cholesterol: 108 mg/dL — ABNORMAL HIGH (ref 0–99)
Total CHOL/HDL Ratio: 6
Triglycerides: 147 mg/dL (ref 0.0–149.0)
VLDL: 29.4 mg/dL (ref 0.0–40.0)

## 2012-07-08 LAB — HEPATIC FUNCTION PANEL
Bilirubin, Direct: 0 mg/dL (ref 0.0–0.3)
Total Bilirubin: 0.6 mg/dL (ref 0.3–1.2)

## 2012-07-08 NOTE — Patient Instructions (Addendum)
Your physician wants you to follow-up in: 12 months with Dr Allred You will receive a reminder letter in the mail two months in advance. If you don't receive a letter, please call our office to schedule the follow-up appointment.    Remote monitoring is used to monitor your Pacemaker of ICD from home. This monitoring reduces the number of office visits required to check your device to one time per year. It allows us to keep an eye on the functioning of your device to ensure it is working properly. You are scheduled for a device check from home on 10/05/12. You may send your transmission at any time that day. If you have a wireless device, the transmission will be sent automatically. After your physician reviews your transmission, you will receive a postcard with your next transmission date.   

## 2012-07-08 NOTE — Progress Notes (Signed)
PCP:  HODGES,FRANCISCO, MD Primary Cardiologist:  Dr Shirlee Latch  The patient presents today for routine electrophysiology followup.  Since last being seen in our clinic, the patient reports doing reasonably well.  Today, he denies symptoms of palpitations, chest pain, shortness of breath, orthopnea, PND, lower extremity edema, dizziness, presyncope, syncope, or neurologic sequela.  He has received no recent ICD shocks.  The patient feels that he is tolerating medications without difficulties and is otherwise without complaint today.   Past Medical History  Diagnosis Date  . CAD (coronary artery disease)     hx of silent MI in 1993. likely an inferior MI. hx of 2D cardiogram in 3/09 showing EF of 40%. hx of myoview in [place] 3/09-nml. presented to [facility] 5/10 with VT and mildly elevated cardiac enzymes LHC (5/10): inferobasal dyskinesis with EF 35-40%. was chronic total occlusion of mid RCA with good collaterals. luminals LCA. this does not appear to be an acute cause of the 5/10 event  . HTN (hypertension)   . HLD (hyperlipidemia)   . Tobacco abuse     47 pack year hx; quit 8/09  . PAD (peripheral artery disease)     s/p L-to-R fem-fem bypass performed by Dr. Edilia Bo at [facility] in 2009   . Rheumatoid arthritis     on leflunomide  . Ischemic cardiomyopathy     EF 35-40% by LV-gram 5/10 with inferobasal dyskinesis. echo 5/10 showed EF 40% w/mild LVH, no sig. MR, inferobasal and posterobasal akinesis. echo (7/11): EF 50%, mild LVH, basal-mid inferoposterior akenesis  . 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 He has duel chamber Medtronic ICD  . Atrial flutter     s/p isthmus ablation 5/10  . Abnormal PFTs 7/11    FVC 74%, FEV1 80%, ratio 75%, TLC 78%, DLCO 68%. this suggests a mild restrictive and obstructive defect. pt did have a response to bronchodilator. these PFTs were significantly better than the report from Dr. Blenda Nicely in [place] done  prior.   [name] Anxiety    Past Surgical History  Procedure Laterality Date  . Vasectomy    . Tonsillectomy    . Cardiac defibrillator placement      Medtronic; [facility] - yes     Current Outpatient Prescriptions  Medication Sig Dispense Refill  . amiodarone (PACERONE) 200 MG tablet Take 1 tablet (200 mg total) by mouth daily.  90 tablet  3  . aspirin EC 81 MG tablet Take 1 tablet (81 mg total) by mouth daily.      . carvedilol (COREG) 6.25 MG tablet TAKE 1 TABLET TWICE A DAY  180 tablet  3  . cyclobenzaprine (FLEXERIL) 5 MG tablet Take 5 mg by mouth as needed.      . leflunomide (ARAVA) 20 MG tablet Take 20 mg by mouth daily.        . simvastatin (ZOCOR) 40 MG tablet Pt takes 1/2 of a 80mg  tablet      . torsemide (DEMADEX) 20 MG tablet Take 1 tablet (20 mg total) by mouth daily.  90 tablet  3  . valsartan (DIOVAN) 80 MG tablet Take 80 mg by mouth.       No current facility-administered medications for this visit.    Allergies  Allergen Reactions  . Atorvastatin Other (See Comments)    Muscle pain  . Crestor (Rosuvastatin Calcium) Other (See Comments)    Muscle pain    History   Social History  .  Marital Status: Single    Spouse Name: N/A    Number of Children: N/A  . Years of Education: N/A   Occupational History  . Not on file.   Social History Main Topics  . Smoking status: Former Games developer  . Smokeless tobacco: Not on file     Comment: med hx   . Alcohol Use: Yes     Comment: minimal   . Drug Use: No     Comment: prior   . Sexually Active: Not on file   Other Topics Concern  . 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   Head- normocephalic, atraumatic Eyes-  Sclera clear,  conjunctiva pink Ears- hearing intact Oropharynx- clear Neck- supple, no JVP Lymph- no cervical lymphadenopathy Lungs- Clear to ausculation bilaterally, normal work of breathing Chest- ICD pocket is well healed Heart- Regular rate and rhythm, no murmurs, rubs or gallops, PMI not laterally displaced GI- soft, NT, ND, + BS Extremities- no clubbing, cyanosis, or edema  ICD interrogation- reviewed in detail today,  See PACEART report  ekg today reveals sinus rhythm 53 bpm, PR 208, RBBB, Qtc 422  Assessment and Plan:

## 2012-07-08 NOTE — Assessment & Plan Note (Signed)
Stable No change required today  

## 2012-07-08 NOTE — Assessment & Plan Note (Signed)
Well controlled with amiodarone TFts/ LFTs ordered  Normal ICD function See Pace Art report No changes today

## 2012-07-08 NOTE — Assessment & Plan Note (Signed)
euvolemic No ischemic symptoms Stable No change required today

## 2012-07-15 ENCOUNTER — Ambulatory Visit: Payer: Medicare Other | Admitting: Neurosurgery

## 2012-07-15 ENCOUNTER — Ambulatory Visit: Payer: Medicare Other | Admitting: Vascular Surgery

## 2012-07-15 ENCOUNTER — Encounter (INDEPENDENT_AMBULATORY_CARE_PROVIDER_SITE_OTHER): Payer: Medicare Other | Admitting: *Deleted

## 2012-07-15 DIAGNOSIS — I739 Peripheral vascular disease, unspecified: Secondary | ICD-10-CM | POA: Diagnosis not present

## 2012-07-16 ENCOUNTER — Other Ambulatory Visit: Payer: Self-pay | Admitting: *Deleted

## 2012-07-16 ENCOUNTER — Encounter: Payer: Self-pay | Admitting: Vascular Surgery

## 2012-07-16 DIAGNOSIS — I739 Peripheral vascular disease, unspecified: Secondary | ICD-10-CM

## 2012-07-16 DIAGNOSIS — Z48812 Encounter for surgical aftercare following surgery on the circulatory system: Secondary | ICD-10-CM

## 2012-07-29 ENCOUNTER — Ambulatory Visit (INDEPENDENT_AMBULATORY_CARE_PROVIDER_SITE_OTHER): Payer: Medicare Other | Admitting: Cardiology

## 2012-07-29 ENCOUNTER — Encounter: Payer: Self-pay | Admitting: Cardiology

## 2012-07-29 VITALS — BP 140/78 | HR 53 | Ht 70.5 in | Wt 215.0 lb

## 2012-07-29 DIAGNOSIS — E785 Hyperlipidemia, unspecified: Secondary | ICD-10-CM

## 2012-07-29 DIAGNOSIS — I472 Ventricular tachycardia: Secondary | ICD-10-CM | POA: Diagnosis not present

## 2012-07-29 DIAGNOSIS — I5023 Acute on chronic systolic (congestive) heart failure: Secondary | ICD-10-CM

## 2012-07-29 DIAGNOSIS — I2589 Other forms of chronic ischemic heart disease: Secondary | ICD-10-CM

## 2012-07-29 DIAGNOSIS — I4729 Other ventricular tachycardia: Secondary | ICD-10-CM | POA: Diagnosis not present

## 2012-07-29 DIAGNOSIS — I2510 Atherosclerotic heart disease of native coronary artery without angina pectoris: Secondary | ICD-10-CM | POA: Diagnosis not present

## 2012-07-29 MED ORDER — PITAVASTATIN CALCIUM 2 MG PO TABS: 2.0000 mg | ORAL_TABLET | Freq: Every day | ORAL | Status: DC

## 2012-07-29 MED ORDER — LOSARTAN POTASSIUM 50 MG PO TABS: 50.0000 mg | ORAL_TABLET | Freq: Two times a day (BID) | ORAL | Status: DC

## 2012-07-29 NOTE — Progress Notes (Signed)
Patient ID: Nathan Pruitt, male   DOB: 09-16-1945, 67 y.o.   MRN: [id number] PCP: Dr. Charlott Rakes  67 yo with history of CAD, ischemic CMP, and VT s/p ICD placement returns for followup.  He is on amiodarone and has had no recurrent VT.   Last echo in 7/12 showed EF 45-50%, mild LVH, akinesis of the basal to mid inferoposterior wall.  Symptomatically, he has been stable.  He is not very active.  No chest pain.  Weight is down a pound.  He is still short of breath with heavier activity such as leaf-blowing or walking up a hill.    ECG: NSR, 1st degree AV block, IVCD (nonspecific)  Labs (11/10): K 4.9, creatinine 1.4, LDL 70, HDL 31, LFTs normal Labs (9/62): K 4.4, creatinine 1.3, LDL 61, HDL 35 Labs (7/11): LDL 62, HDL 23 Labs (1/12): K 4.8, LFTs normal, creatinine 1.44, LDL 69, HDL 33 Labs (7/12): LFTs normal, LDL 81, HDL 36, HCT normal, K 4.3, creatinine 1.3, BNP 116, TSH normal Labs (1/13): LDL 39, HDl 31, LFTs normal, TSH  Labs (7/13): TSH normal, LFTs normal, LDL 69, HDL 40 Labs (8/13): K 4.5, creatinine 1.6 Labs (10/13): K 4.4, creatinine 1.4 Labs (3/14): LDL 108, HDL 30, LFTs normal  Allergies (verified):  No Known Drug Allergies  Past Medical History: 1. Coronary artery disease.       a.     The patient reports history of silent MI in 1993.  This was likely an inferior MI (see below).       b.     The patient reports history of 2-D echocardiogram in March        2009, showing an EF of 40%.       c.     The patient reports history of Myoview in [facility] in March 2009, which        was per his report normal.       d.     The patient presented to [facility] in 5/10 with VT and mildly elevated cardiac enzymes.         LHC (5/10):  Inferobasal dyskinesis with EF 35-40%.  There was chronic total occlusion of the         mid RCA with good collaterals.  Luminals LCA.  This did not appear to be an acute cause of the             5/10 event.  2. Hypertension.  3. Hyperlipidemia.   4. Remote tobacco abuse with 47-pack-year history, quitting in August 2009.  5. Peripheral arterial disease.       a.     Status post left-to-right fem-fem bypass performed in        [place] in March 2009.       b.     Status post redo left-to-right fem-fem bypass performed by        Dr. Edilia Bo at [facility] in 2009.  6. Rheumatoid arthritis, on leflunomide.  7.  Ischemic cardiomyopathy:  EF 35-40% by LV-gram 5/10 with inferobasal dyskinesis.  Echo 5/10 showed EF 40% with mild LVH, no significant MR, inferobasal and posterobasal akinesis.  Echo (7/11): EF 50%, mild LVH, basal-mid inferoposterior akinesis.  Echo (7/12): EF 45-50% with basal anterolateral, basal posterior, and basal to mid inferior akinesis.   8.  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  He has a dual chamber Medtronic ICD.  9.  Atrial  flutter:  Status post isthmus ablation 5/10.   10.  PFTs (7/11): FVC 74%, FEV1 80%, ratio 75%, TLC 78%, DLCO 68%.  This suggests a mild restrictive defect and a mild obstructive defect.  He did have response to bronchodilator. These PFTs were significantly better than the report from Dr. Blenda Nicely in [place] done prior.  He had last PFTs in 2/12 with no significant change.  11.  Anxiety 12.  Chronic cough: No relief with change from ACEI to ARB or with trial of PPI.   Family History: Mother died in her late 34s with gastric cancer.  Father died in his late 83s with throat cancer and PVD.   He had a brother who died at 43 of an MI.   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 -47ppy hx, quit 2009.  Alcohol Use - yes-minimal Regular Exercise - yes Drug Use - no (prior)  Review of Systems        All systems reviewed and negative except as per HPI.   Current Outpatient Prescriptions  Medication Sig Dispense Refill  . amiodarone (PACERONE) 200 MG tablet Take 1 tablet (200  mg total) by mouth daily.  90 tablet  3  . aspirin EC 81 MG tablet Take 1 tablet (81 mg total) by mouth daily.      . carvedilol (COREG) 6.25 MG tablet TAKE 1 TABLET TWICE A DAY  180 tablet  3  . cyclobenzaprine (FLEXERIL) 5 MG tablet Take 5 mg by mouth as needed.      . leflunomide (ARAVA) 20 MG tablet Take 20 mg by mouth daily.        [name] torsemide (DEMADEX) 20 MG tablet Take 1 tablet (20 mg total) by mouth daily.  90 tablet  3  . losartan (COZAAR) 50 MG tablet Take 1 tablet (50 mg total) by mouth 2 (two) times daily.  180 tablet  3  . Pitavastatin Calcium 2 MG TABS Take 1 tablet (2 mg total) by mouth daily.  90 tablet  1   No current facility-administered medications for this visit.    BP 140/78  Pulse 53  Ht 5' 10.5" (1.791 m)  Wt 215 lb (97.523 kg)  BMI 30.4 kg/m2 General:  Well developed, well nourished, in no acute distress. Neck:  Neck supple, no JVD. No masses, thyromegaly or abnormal cervical nodes. Lungs:  Slightly decreased breath sounds bilaterally.  Heart:  Non-displaced PMI, chest non-tender; regular rate and rhythm, S1, S2 without murmurs, rubs or gallops. Carotid upstroke normal, no bruit. Normal abdominal aortic size, no bruits. Femorals normal pulses, no bruits. Pedals normal pulses. No edema, no varicosities. Abdomen:  Bowel sounds positive; abdomen soft and non-tender without masses, organomegaly, or hernias noted. No hepatosplenomegaly. Extremities:  No clubbing or cyanosis. Neurologic:  Alert and oriented x 3. Psych:  Normal affect.  Assessment/Plan:  CARDIOMYOPATHY, ISCHEMIC Patient does not appear volume overloaded on exam and has stable NYHA class II-III dyspnea.  Last EF by echo was 45-50%.  - Continue current torsemide and Coreg. - Valsartan is very expensive for him, will change to losartan 50 mg bid.  Will get BMET/BNP in 2 wks.   - Needs to watch sodium in his diet.  CAD, NATIVE VESSEL No chest pain. Continue ASA, statin, Coreg, ARB.  HYPERLIPIDEMIA   He is using amiodarone so really should be on no more than 20 mg of simvastatin.  LDL too high already on 40 mg daily simvastatin.  I am going to switch him to Livalo 2 mg daily (myalgias with Crestor and Lipitor).  Lipids/LFTs in 2 months.  VENTRICULAR TACHYCARDIA  Continue amiodarone. Also has ICD.  Recent LFTs normal, check TSH, he has done baseline PFTs. Needs yearly eye exam.   Marca Ancona 07/29/2012

## 2012-07-29 NOTE — Patient Instructions (Addendum)
Stop Diovan.   Start Losartan 50mg  two times a day.  Losartan is in the place of Diovan.  Stop Simvastatin.  Start Livalo(pitavastatin) 2mg  daily.  Livalo takes the place of simvastatin (zocor) .  Take and record your blood pressure daily. I will call you in 2 weeks to get the readings. Nathan Pruitt [phone]  Your physician recommends that you return for lab work in: 2 weeks--BMET/BNP/TSH. You have the order. Fax the results to [phone].  Your physician recommends that you return for a FASTING lipid profile /liver profile in 2 months after you start pitavastatin. You have the order. Fax the results to [phone].  Your physician wants you to follow-up in: 6 months with Dr Shirlee Latch. (September 2014). You will receive a reminder letter in the mail two months in advance. If you don't receive a letter, please call our office to schedule the follow-up appointment.

## 2012-07-30 ENCOUNTER — Other Ambulatory Visit: Payer: Self-pay | Admitting: Cardiology

## 2012-07-31 ENCOUNTER — Other Ambulatory Visit: Payer: Self-pay | Admitting: *Deleted

## 2012-07-31 ENCOUNTER — Other Ambulatory Visit: Payer: Self-pay | Admitting: Cardiology

## 2012-07-31 NOTE — Telephone Encounter (Signed)
Patient had pharmacy call b/c he wants to go back on Simvastatin. Price of Pitavastatin was too high. Refill for Simvastatin 40mg  #90, 1RF sent to [facility] in [facility], [place]

## 2012-08-13 ENCOUNTER — Telehealth: Payer: Self-pay | Admitting: *Deleted

## 2012-08-13 NOTE — Telephone Encounter (Signed)
Spoke with patient to get BP readings since valsartan changed to losartan 07/29/12-- 128/79 55 126/70 55 120/75 53 125/71 61 105/70 56 135/74 54 122/80 59 140/80 59 113/63 70 121/76 54 129/71 58 122/76 52 125/70 54 122/70 52. Pt also states that he is not going to be able to afford Livalo and he is going to have to continue simvastatin 40mg  daily. I will forward to Dr Shirlee Latch for review.

## 2012-08-14 NOTE — Telephone Encounter (Signed)
BP is ok 

## 2012-08-14 NOTE — Telephone Encounter (Signed)
Pt contacted and told to continue same meds and call with questions or concerns, pt verbalized understanding.

## 2012-08-19 ENCOUNTER — Other Ambulatory Visit: Payer: Self-pay

## 2012-08-19 ENCOUNTER — Encounter: Payer: Self-pay | Admitting: Cardiology

## 2012-08-19 DIAGNOSIS — M069 Rheumatoid arthritis, unspecified: Secondary | ICD-10-CM | POA: Diagnosis not present

## 2012-08-19 DIAGNOSIS — I10 Essential (primary) hypertension: Secondary | ICD-10-CM

## 2012-08-19 DIAGNOSIS — I2510 Atherosclerotic heart disease of native coronary artery without angina pectoris: Secondary | ICD-10-CM | POA: Diagnosis not present

## 2012-08-19 DIAGNOSIS — Z79899 Other long term (current) drug therapy: Secondary | ICD-10-CM | POA: Diagnosis not present

## 2012-08-19 DIAGNOSIS — I5023 Acute on chronic systolic (congestive) heart failure: Secondary | ICD-10-CM | POA: Diagnosis not present

## 2012-09-03 ENCOUNTER — Encounter: Payer: Self-pay | Admitting: Cardiology

## 2012-09-03 DIAGNOSIS — L570 Actinic keratosis: Secondary | ICD-10-CM | POA: Diagnosis not present

## 2012-09-03 DIAGNOSIS — Z79899 Other long term (current) drug therapy: Secondary | ICD-10-CM | POA: Diagnosis not present

## 2012-09-03 DIAGNOSIS — I5023 Acute on chronic systolic (congestive) heart failure: Secondary | ICD-10-CM | POA: Diagnosis not present

## 2012-09-03 DIAGNOSIS — I472 Ventricular tachycardia: Secondary | ICD-10-CM | POA: Diagnosis not present

## 2012-09-03 DIAGNOSIS — I4729 Other ventricular tachycardia: Secondary | ICD-10-CM | POA: Diagnosis not present

## 2012-09-03 DIAGNOSIS — L723 Sebaceous cyst: Secondary | ICD-10-CM | POA: Diagnosis not present

## 2012-09-03 DIAGNOSIS — I2510 Atherosclerotic heart disease of native coronary artery without angina pectoris: Secondary | ICD-10-CM | POA: Diagnosis not present

## 2012-09-07 ENCOUNTER — Telehealth: Payer: Self-pay | Admitting: Nurse Practitioner

## 2012-09-07 NOTE — Telephone Encounter (Signed)
Results faxed to [phone] ATTN:  Madeline.  Madeline aware

## 2012-10-01 ENCOUNTER — Telehealth: Payer: Self-pay | Admitting: Cardiology

## 2012-10-01 MED ORDER — VALSARTAN 80 MG PO TABS: 80.0000 mg | ORAL_TABLET | Freq: Every day | ORAL | Status: DC

## 2012-10-01 MED ORDER — VALSARTAN 80 MG PO TABS: 80.0000 mg | ORAL_TABLET | Freq: Two times a day (BID) | ORAL | Status: DC

## 2012-10-01 NOTE — Telephone Encounter (Signed)
Pt states he has been having muscle pain from losartan. He is requesting to change back to Diovan. Reviewed and OK'd with Dr Shirlee Latch.

## 2012-10-01 NOTE — Telephone Encounter (Signed)
Dr Shirlee Latch has ordered TSH/free T4/free T3/BMET/Lipid profile liver profile to be done a few days before appt with him in September. Pt is aware I am mailing him an order for these to be done at [facility] before Sept appt with Dr Shirlee Latch.

## 2012-10-01 NOTE — Telephone Encounter (Signed)
New Prob      Calling to clarify directions for DIOVAN. Please call.

## 2012-10-01 NOTE — Telephone Encounter (Signed)
Clarified diovan 80 mg bid.

## 2012-10-01 NOTE — Telephone Encounter (Signed)
LMTCB

## 2012-10-01 NOTE — Telephone Encounter (Signed)
New Prob    Pt feels he is having a reaction to LOSARTAN and requesting to go back to [facility]. [facility]

## 2012-10-05 ENCOUNTER — Ambulatory Visit (INDEPENDENT_AMBULATORY_CARE_PROVIDER_SITE_OTHER): Payer: Medicare Other | Admitting: *Deleted

## 2012-10-05 ENCOUNTER — Encounter: Payer: Self-pay | Admitting: Internal Medicine

## 2012-10-05 DIAGNOSIS — Z95810 Presence of automatic (implantable) cardiac defibrillator: Secondary | ICD-10-CM | POA: Diagnosis not present

## 2012-10-05 DIAGNOSIS — R0602 Shortness of breath: Secondary | ICD-10-CM

## 2012-10-05 DIAGNOSIS — I472 Ventricular tachycardia: Secondary | ICD-10-CM | POA: Diagnosis not present

## 2012-10-05 DIAGNOSIS — I4729 Other ventricular tachycardia: Secondary | ICD-10-CM

## 2012-10-05 LAB — REMOTE ICD DEVICE
AL AMPLITUDE: 2.9 mv
AL IMPEDENCE ICD: 380 Ohm
ATRIAL PACING ICD: 3.64 pct
BATTERY VOLTAGE: 3.0341 V
CHARGE TIME: 9.138 s
DEV-0020ICD: NEGATIVE
FVT: 0
TOT-0001: 5
TZAT-0001ATACH: 2
TZAT-0001FASTVT: 1
TZAT-0001SLOWVT: 1
TZAT-0001SLOWVT: 2
TZAT-0001SLOWVT: 3
TZAT-0002ATACH: NEGATIVE
TZAT-0002FASTVT: NEGATIVE
TZAT-0005SLOWVT: 78 pct
TZAT-0005SLOWVT: 78 pct
TZAT-0005SLOWVT: 81 pct
TZAT-0011SLOWVT: 10 ms
TZAT-0012ATACH: 150 ms
TZAT-0013SLOWVT: 2
TZAT-0013SLOWVT: 2
TZAT-0013SLOWVT: 2
TZAT-0018ATACH: NEGATIVE
TZAT-0018SLOWVT: NEGATIVE
TZAT-0018SLOWVT: NEGATIVE
TZAT-0018SLOWVT: NEGATIVE
TZAT-0019ATACH: 6 V
TZAT-0019ATACH: 6 V
TZAT-0019FASTVT: 8 V
TZAT-0019SLOWVT: 8 V
TZAT-0019SLOWVT: 8 V
TZAT-0019SLOWVT: 8 V
TZAT-0020ATACH: 1.5 ms
TZAT-0020ATACH: 1.5 ms
TZAT-0020ATACH: 1.5 ms
TZON-0003ATACH: 350 ms
TZON-0003VSLOWVT: 450 ms
TZON-0004SLOWVT: 28
TZON-0004VSLOWVT: 20
TZON-0005SLOWVT: 20
TZST-0001FASTVT: 3
TZST-0001FASTVT: 4
TZST-0001FASTVT: 5
TZST-0001SLOWVT: 5
TZST-0002ATACH: NEGATIVE
TZST-0002FASTVT: NEGATIVE
TZST-0002FASTVT: NEGATIVE
TZST-0002FASTVT: NEGATIVE
TZST-0003SLOWVT: 35 J
VENTRICULAR PACING ICD: 0.03 pct

## 2012-10-13 DIAGNOSIS — N400 Enlarged prostate without lower urinary tract symptoms: Secondary | ICD-10-CM | POA: Diagnosis not present

## 2012-10-13 DIAGNOSIS — R972 Elevated prostate specific antigen [PSA]: Secondary | ICD-10-CM | POA: Diagnosis not present

## 2012-10-13 DIAGNOSIS — N402 Nodular prostate without lower urinary tract symptoms: Secondary | ICD-10-CM | POA: Diagnosis not present

## 2012-10-13 DIAGNOSIS — N390 Urinary tract infection, site not specified: Secondary | ICD-10-CM | POA: Diagnosis not present

## 2012-10-14 ENCOUNTER — Encounter: Payer: Self-pay | Admitting: *Deleted

## 2012-10-14 ENCOUNTER — Other Ambulatory Visit: Payer: Self-pay | Admitting: Cardiology

## 2012-10-14 ENCOUNTER — Telehealth: Payer: Self-pay | Admitting: *Deleted

## 2012-10-14 MED ORDER — CARVEDILOL 12.5 MG PO TABS: 12.5000 mg | ORAL_TABLET | Freq: Two times a day (BID) | ORAL | Status: DC

## 2012-10-15 ENCOUNTER — Encounter: Payer: Self-pay | Admitting: Cardiology

## 2012-10-15 NOTE — Progress Notes (Signed)
Pt was called and made aware of change in med. [facility] [place] for new mg and dose.

## 2012-11-23 DIAGNOSIS — R972 Elevated prostate specific antigen [PSA]: Secondary | ICD-10-CM | POA: Diagnosis not present

## 2012-11-23 DIAGNOSIS — N402 Nodular prostate without lower urinary tract symptoms: Secondary | ICD-10-CM | POA: Diagnosis not present

## 2012-11-23 DIAGNOSIS — R351 Nocturia: Secondary | ICD-10-CM | POA: Diagnosis not present

## 2012-11-23 DIAGNOSIS — N400 Enlarged prostate without lower urinary tract symptoms: Secondary | ICD-10-CM | POA: Diagnosis not present

## 2012-11-30 DIAGNOSIS — R972 Elevated prostate specific antigen [PSA]: Secondary | ICD-10-CM | POA: Diagnosis not present

## 2012-11-30 DIAGNOSIS — N402 Nodular prostate without lower urinary tract symptoms: Secondary | ICD-10-CM | POA: Diagnosis not present

## 2012-11-30 DIAGNOSIS — N400 Enlarged prostate without lower urinary tract symptoms: Secondary | ICD-10-CM | POA: Diagnosis not present

## 2012-11-30 DIAGNOSIS — R351 Nocturia: Secondary | ICD-10-CM | POA: Diagnosis not present

## 2012-12-15 DIAGNOSIS — M069 Rheumatoid arthritis, unspecified: Secondary | ICD-10-CM | POA: Diagnosis not present

## 2012-12-15 DIAGNOSIS — Z79899 Other long term (current) drug therapy: Secondary | ICD-10-CM | POA: Diagnosis not present

## 2012-12-22 DIAGNOSIS — M159 Polyosteoarthritis, unspecified: Secondary | ICD-10-CM | POA: Diagnosis not present

## 2012-12-22 DIAGNOSIS — M069 Rheumatoid arthritis, unspecified: Secondary | ICD-10-CM | POA: Diagnosis not present

## 2012-12-22 DIAGNOSIS — M545 Low back pain, unspecified: Secondary | ICD-10-CM | POA: Diagnosis not present

## 2013-01-01 ENCOUNTER — Other Ambulatory Visit: Payer: Self-pay | Admitting: Cardiology

## 2013-01-01 NOTE — Telephone Encounter (Signed)
I called pt to see if pt  was taking simvastation     or livalo. Pt states he is unable to take livalo and needs a refill on simvastatin 40

## 2013-01-01 NOTE — Telephone Encounter (Signed)
I spoke with Nathan Pruitt and she informed me to refill pt script.

## 2013-01-12 ENCOUNTER — Other Ambulatory Visit: Payer: Self-pay | Admitting: *Deleted

## 2013-01-12 ENCOUNTER — Ambulatory Visit (INDEPENDENT_AMBULATORY_CARE_PROVIDER_SITE_OTHER): Payer: Medicare Other | Admitting: *Deleted

## 2013-01-12 ENCOUNTER — Ambulatory Visit: Payer: Medicare Other | Admitting: *Deleted

## 2013-01-12 ENCOUNTER — Encounter: Payer: Self-pay | Admitting: Cardiology

## 2013-01-12 DIAGNOSIS — I5023 Acute on chronic systolic (congestive) heart failure: Secondary | ICD-10-CM | POA: Diagnosis not present

## 2013-01-12 DIAGNOSIS — Z95810 Presence of automatic (implantable) cardiac defibrillator: Secondary | ICD-10-CM

## 2013-01-12 DIAGNOSIS — R0602 Shortness of breath: Secondary | ICD-10-CM

## 2013-01-12 DIAGNOSIS — I2510 Atherosclerotic heart disease of native coronary artery without angina pectoris: Secondary | ICD-10-CM | POA: Diagnosis not present

## 2013-01-12 DIAGNOSIS — I4729 Other ventricular tachycardia: Secondary | ICD-10-CM

## 2013-01-12 DIAGNOSIS — E785 Hyperlipidemia, unspecified: Secondary | ICD-10-CM | POA: Diagnosis not present

## 2013-01-12 DIAGNOSIS — I472 Ventricular tachycardia: Secondary | ICD-10-CM

## 2013-01-12 MED ORDER — PITAVASTATIN CALCIUM 2 MG PO TABS: 2.0000 mg | ORAL_TABLET | Freq: Every day | ORAL | Status: DC

## 2013-01-20 LAB — REMOTE ICD DEVICE
BAMS-0001: 170 {beats}/min
BATTERY VOLTAGE: 3.0545 V
DEV-0020ICD: NEGATIVE
FVT: 0
PACEART VT: 0
RV LEAD AMPLITUDE: 11.4 mv
TZAT-0001ATACH: 1
TZAT-0002ATACH: NEGATIVE
TZAT-0002ATACH: NEGATIVE
TZAT-0004SLOWVT: 8
TZAT-0005SLOWVT: 78 pct
TZAT-0005SLOWVT: 81 pct
TZAT-0011SLOWVT: 10 ms
TZAT-0011SLOWVT: 10 ms
TZAT-0011SLOWVT: 10 ms
TZAT-0012ATACH: 150 ms
TZAT-0012SLOWVT: 200 ms
TZAT-0012SLOWVT: 200 ms
TZAT-0012SLOWVT: 200 ms
TZAT-0018ATACH: NEGATIVE
TZAT-0019ATACH: 6 V
TZAT-0019ATACH: 6 V
TZAT-0019ATACH: 6 V
TZAT-0019SLOWVT: 8 V
TZAT-0019SLOWVT: 8 V
TZAT-0019SLOWVT: 8 V
TZAT-0020ATACH: 1.5 ms
TZAT-0020FASTVT: 1.5 ms
TZON-0003ATACH: 350 ms
TZON-0003SLOWVT: 470 ms
TZON-0005SLOWVT: 20
TZST-0001ATACH: 5
TZST-0001FASTVT: 2
TZST-0001FASTVT: 3
TZST-0001SLOWVT: 5
TZST-0002FASTVT: NEGATIVE
TZST-0003SLOWVT: 20 J
TZST-0003SLOWVT: 35 J

## 2013-01-21 ENCOUNTER — Encounter: Payer: Self-pay | Admitting: *Deleted

## 2013-01-26 ENCOUNTER — Ambulatory Visit (INDEPENDENT_AMBULATORY_CARE_PROVIDER_SITE_OTHER): Payer: Medicare Other | Admitting: Cardiology

## 2013-01-26 ENCOUNTER — Encounter: Payer: Self-pay | Admitting: Cardiology

## 2013-01-26 VITALS — BP 105/73 | HR 52 | Ht 71.0 in | Wt 217.0 lb

## 2013-01-26 DIAGNOSIS — I4729 Other ventricular tachycardia: Secondary | ICD-10-CM | POA: Diagnosis not present

## 2013-01-26 DIAGNOSIS — I2510 Atherosclerotic heart disease of native coronary artery without angina pectoris: Secondary | ICD-10-CM | POA: Diagnosis not present

## 2013-01-26 DIAGNOSIS — I472 Ventricular tachycardia: Secondary | ICD-10-CM

## 2013-01-26 DIAGNOSIS — E785 Hyperlipidemia, unspecified: Secondary | ICD-10-CM | POA: Diagnosis not present

## 2013-01-26 DIAGNOSIS — I2589 Other forms of chronic ischemic heart disease: Secondary | ICD-10-CM

## 2013-01-26 NOTE — Progress Notes (Signed)
Patient ID: Nathan Pruitt, male   DOB: [birth year], 67 y.o.   MRN: [id number] PCP: Dr. Charlott Rakes  67 yo with history of CAD, ischemic CMP, and VT s/p ICD placement returns for followup.  He is on amiodarone.  A couple of months ago, it appears that he had some NSVT noted on ICD monitoring and was told to increase his Coreg to 12.5 mg bid.   Last echo in 7/12 showed EF 45-50%, mild LVH, akinesis of the basal to mid inferoposterior wall.  Symptomatically, he has been stable.  He is not very active.  No chest pain.  Weight is up 2 lbs.  He is still short of breath with heavier activity such as leaf-blowing or walking up a hill.  He has tolerated Livalo so far without myalgias but it is expensive.   Labs (11/10): K 4.9, creatinine 1.4, LDL 70, HDL 31, LFTs normal Labs (2/72): K 4.4, creatinine 1.3, LDL 61, HDL 35 Labs (7/11): LDL 62, HDL 23 Labs (1/12): K 4.8, LFTs normal, creatinine 1.44, LDL 69, HDL 33 Labs (7/12): LFTs normal, LDL 81, HDL 36, HCT normal, K 4.3, creatinine 1.3, BNP 116, TSH normal Labs (1/13): LDL 39, HDl 31, LFTs normal, TSH  Labs (7/13): TSH normal, LFTs normal, LDL 69, HDL 40 Labs (8/13): K 4.5, creatinine 1.6 Labs (10/13): K 4.4, creatinine 1.4 Labs (3/14): LDL 108, HDL 30, LFTs normal Labs (5/36): K 4.2, creatinine 1.6, LFTs normal, LDL 114, TSH 10.6 (elevated), free T4/free T3 normal  Allergies (verified):  No Known Drug Allergies  Past Medical History: 1. Coronary artery disease.       a.     The patient reports history of silent MI in 1993.  This was likely an inferior MI (see below).       b.     The patient reports history of 2-D echocardiogram in March        2009, showing an EF of 40%.       c.     The patient reports history of Myoview in [facility] in March 2009, which        was per his report normal.       d.     The patient presented to [facility] in 5/10 with VT and mildly elevated cardiac enzymes.         LHC (5/10):  Inferobasal dyskinesis with EF  35-40%.  There was chronic total occlusion of the         mid RCA with good collaterals.  Luminals LCA.  This did not appear to be an acute cause of the             5/10 event.  2. Hypertension.  3. Hyperlipidemia.  4. Remote tobacco abuse with 47-pack-year history, quitting in August 2009.  5. Peripheral arterial disease.       a.     Status post left-to-right fem-fem bypass performed in        [place] in March 2009.       b.     Status post redo left-to-right fem-fem bypass performed by        Dr. Edilia Bo at [facility] in 2009.  6. Rheumatoid arthritis, on leflunomide.  7.  Ischemic cardiomyopathy:  EF 35-40% by LV-gram 5/10 with inferobasal dyskinesis.  Echo 5/10 showed EF 40% with mild LVH, no significant MR, inferobasal and posterobasal akinesis.  Echo (7/11): EF 50%, mild LVH, basal-mid inferoposterior akinesis.  Echo (7/12): EF  45-50% with basal anterolateral, basal posterior, and basal to mid inferior akinesis.   8.  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  He has a dual chamber Medtronic ICD.  9.  Atrial flutter:  Status post isthmus ablation 5/10.   10.  PFTs (7/11): FVC 74%, FEV1 80%, ratio 75%, TLC 78%, DLCO 68%.  This suggests a mild restrictive defect and a mild obstructive defect.  He did have response to bronchodilator. These PFTs were significantly better than the report from Dr. Blenda Nicely in [place] done prior.  He had last PFTs in 2/12 with no significant change.  11.  Anxiety 12.  Chronic cough: No relief with change from ACEI to ARB or with trial of PPI.   Family History: Mother died in her late 12s with gastric cancer.  Father died in his late 51s with throat cancer and PVD.   He had a brother who died at 70 of an MI.   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 -47ppy hx, quit 2009.  Alcohol Use - yes-minimal Regular Exercise - yes Drug Use - no  (prior)  Review of Systems        All systems reviewed and negative except as per HPI.   Current Outpatient Prescriptions  Medication Sig Dispense Refill  . amiodarone (PACERONE) 200 MG tablet Take 1 tablet (200 mg total) by mouth daily.  90 tablet  3  . aspirin EC 81 MG tablet Take 1 tablet (81 mg total) by mouth daily.      . carvedilol (COREG) 12.5 MG tablet Take 1 tablet (12.5 mg total) by mouth 2 (two) times daily with a meal.  180 tablet  6  . cyclobenzaprine (FLEXERIL) 5 MG tablet Take 5 mg by mouth 3 (three) times daily as needed for muscle spasms.      [name] leflunomide (ARAVA) 20 MG tablet Take 20 mg by mouth daily.        . Pitavastatin Calcium 2 MG TABS Take 1 tablet (2 mg total) by mouth daily.  90 tablet  1  . torsemide (DEMADEX) 20 MG tablet TAKE 1 TABLET EVERY DAY  90 tablet  1  . valsartan (DIOVAN) 80 MG tablet Take 1 tablet (80 mg total) by mouth 2 (two) times daily.  180 tablet  3   No current facility-administered medications for this visit.    BP 105/73  Pulse 52  Ht 5\' 11"  (1.803 m)  Wt 98.431 kg (217 lb)  BMI 30.28 kg/m2 General:  Well developed, well nourished, in no acute distress. Neck:  Neck supple, no JVD. No masses, thyromegaly or abnormal cervical nodes. Lungs:  Slightly decreased breath sounds bilaterally.  Heart:  Non-displaced PMI, chest non-tender; regular rate and rhythm, S1, S2 without murmurs, rubs or gallops. Carotid upstroke normal, no bruit. Normal abdominal aortic size, no bruits. Femorals normal pulses, no bruits. Pedals normal pulses. No edema, no varicosities. Abdomen:  Bowel sounds positive; abdomen soft and non-tender without masses, organomegaly, or hernias noted. No hepatosplenomegaly. Extremities:  No clubbing or cyanosis. Neurologic:  Alert and oriented x 3. Psych:  Normal affect.  Assessment/Plan:  CARDIOMYOPATHY, ISCHEMIC Patient does not appear volume overloaded on exam and has stable NYHA class II-III dyspnea.  Last EF by echo was  45-50%.  Stable CKD.  - Continue current torsemide, valsartan, and Coreg. CAD, NATIVE VESSEL No chest pain. Continue ASA, statin, Coreg, ARB.  HYPERLIPIDEMIA  He is using amiodarone so really should be on no more than 10 mg of simvastatin.  Myalgias with Lipitor and Crestor.  He is on Livalo now and tolerating it. Will get lipids/LFTs in 2 months. VENTRICULAR TACHYCARDIA  Continue amiodarone and Coreg (recently increased). Also has ICD.  Recent LFTs normal.  TSH was high when checked earlier this month but free T3 and T4 normal.  Will check free T3/T4 and TSH again in 2 months => follow closely.  Will send copy of labs to PCP.  Given VT, I think he needs to stay on amiodarone. He knows to have a yearly eye exam.    Marca Ancona 01/26/2013

## 2013-01-26 NOTE — Patient Instructions (Addendum)
Your physician recommends that you have  a FASTING lipid profile /liver profile/BMET/TSH/Free T4/Free T3 in 2 months--you have the order.  Your physician recommends that you have a FASTING lipid profile /liver profiel/BMET/TSH/Free T4/Free T3 in about 6 months a few days before you see Dr Shirlee Latch.  You have the order.   Your physician wants you to follow-up in: 6 months with Dr Shirlee Latch. (March 2015). You will receive a reminder letter in the mail two months in advance. If you don't receive a letter, please call our office to schedule the follow-up appointment.

## 2013-01-29 ENCOUNTER — Encounter: Payer: Self-pay | Admitting: *Deleted

## 2013-02-06 ENCOUNTER — Encounter: Payer: Self-pay | Admitting: Internal Medicine

## 2013-02-09 ENCOUNTER — Telehealth: Payer: Self-pay | Admitting: Cardiology

## 2013-02-09 NOTE — Telephone Encounter (Signed)
Called pt x 2 phone sound busy.

## 2013-02-09 NOTE — Telephone Encounter (Signed)
New Problem:  Pt states his med Consuelo Pandy is not covered... Pt states he would like to discuss his options for other meds that are covered by his insurance.

## 2013-02-10 NOTE — Telephone Encounter (Signed)
Pt states he found insurance plan that would cover Livalo, does not need other recommendations.

## 2013-03-01 ENCOUNTER — Telehealth: Payer: Self-pay | Admitting: Cardiology

## 2013-03-01 DIAGNOSIS — N402 Nodular prostate without lower urinary tract symptoms: Secondary | ICD-10-CM | POA: Diagnosis not present

## 2013-03-01 DIAGNOSIS — R351 Nocturia: Secondary | ICD-10-CM | POA: Diagnosis not present

## 2013-03-01 DIAGNOSIS — N390 Urinary tract infection, site not specified: Secondary | ICD-10-CM | POA: Diagnosis not present

## 2013-03-01 DIAGNOSIS — N400 Enlarged prostate without lower urinary tract symptoms: Secondary | ICD-10-CM | POA: Diagnosis not present

## 2013-03-01 MED ORDER — PRAVASTATIN SODIUM 80 MG PO TABS: 80.0000 mg | ORAL_TABLET | Freq: Every evening | ORAL | Status: DC

## 2013-03-01 NOTE — Telephone Encounter (Signed)
Go to pravastatin 80 mg daily.  Lipids/LFTs in 2 months.

## 2013-03-01 NOTE — Telephone Encounter (Signed)
Pt advised.

## 2013-03-01 NOTE — Telephone Encounter (Signed)
Need to discuss the new med  Livao.   Having problems with this med.   Waking up with night sweats and not being able to [place] the constipation.

## 2013-03-01 NOTE — Telephone Encounter (Signed)
Pt states livalo causing night sweats and constipation. Pt asking for all alternate recommendations since he will be getting new medical plan after 1st of year. He cannot take simvastatin because of potential interaction with amiodarone, he cannot take atorvastatin or crestor due to muscle aches. I will forward to Dr Shirlee Latch for review.

## 2013-03-01 NOTE — Telephone Encounter (Signed)
Pt already has order for lipid/liver profile.

## 2013-03-09 DIAGNOSIS — C44319 Basal cell carcinoma of skin of other parts of face: Secondary | ICD-10-CM | POA: Diagnosis not present

## 2013-03-09 DIAGNOSIS — L570 Actinic keratosis: Secondary | ICD-10-CM | POA: Diagnosis not present

## 2013-03-15 ENCOUNTER — Other Ambulatory Visit: Payer: Self-pay | Admitting: Cardiology

## 2013-04-02 ENCOUNTER — Other Ambulatory Visit: Payer: Self-pay | Admitting: Cardiology

## 2013-04-02 ENCOUNTER — Telehealth: Payer: Self-pay | Admitting: *Deleted

## 2013-04-02 NOTE — Telephone Encounter (Signed)
BUSY X1 ./CY 

## 2013-04-02 NOTE — Telephone Encounter (Signed)
SPOKE WITH  PT  HAS  STOPPED  PRAVASTATIN  AND  COUGH   HAS  SIG  IMPROVED   ONLY OTHER MED  INS WILL  COVER  IS  LOVASTATIN  UP  TO  40 MG  WILL  FORWARD  TO  DR [facility]./CY

## 2013-04-02 NOTE — Telephone Encounter (Signed)
Follow up    Pt is having a violent cough reaction to  med PRAVASTIN sodium.   Can he reduce the dosage???.  Pt would like a call back about what he should do.    The  approved med he can take Lovasatatin this is the only one left.

## 2013-04-05 ENCOUNTER — Telehealth: Payer: Self-pay | Admitting: Cardiology

## 2013-04-05 NOTE — Telephone Encounter (Signed)
New message  Patient spoke with you last week regarding reaction to pravasatin, Patient has stopped taking it. Only approved drug through insurance is Lovastation 40mg . He would like for you to call him regarding this and advise.

## 2013-04-05 NOTE — Telephone Encounter (Signed)
Pt states he developed a cough from pravachol about 4 days ago. He stopped the pravachol and his cough has resolved. He is requesting a prescription for lovastatin 40mg  in the place of pravachol, lovastatin 40mg  is the only other statin on his prescription drug formulary. I will forward to Dr Shirlee Latch for review.

## 2013-04-05 NOTE — Telephone Encounter (Signed)
Error ./cy 

## 2013-04-05 NOTE — Telephone Encounter (Signed)
He can have lovastatin 80 mg daily, but cough was probably just coincidental to pravastatin. Lipids/LFTs 2 months.

## 2013-04-06 MED ORDER — LOVASTATIN 40 MG PO TABS: ORAL_TABLET | ORAL | Status: DC

## 2013-04-06 NOTE — Telephone Encounter (Signed)
Follow Up:  Pt states he never heard from anyone regarding what to do with his meds. Pt is requesting a nurse call him.

## 2013-04-06 NOTE — Telephone Encounter (Signed)
Returned call to patient Dr.Mclean advised to stop pravastatin and start lovastatin 80 mg daily.Patient stated cough has completely stopped since he stopped pravastatin.Stated he will have fasting lipid and liver panels 06/07/13 at [facility].

## 2013-04-19 ENCOUNTER — Ambulatory Visit (INDEPENDENT_AMBULATORY_CARE_PROVIDER_SITE_OTHER): Payer: Medicare Other | Admitting: *Deleted

## 2013-04-19 DIAGNOSIS — I4729 Other ventricular tachycardia: Secondary | ICD-10-CM

## 2013-04-19 DIAGNOSIS — I2589 Other forms of chronic ischemic heart disease: Secondary | ICD-10-CM

## 2013-04-19 DIAGNOSIS — I472 Ventricular tachycardia, unspecified: Secondary | ICD-10-CM

## 2013-04-19 LAB — MDC_IDC_ENUM_SESS_TYPE_REMOTE
Brady Statistic AP VP Percent: 0.01 %
Brady Statistic AP VS Percent: 3.73 %
Brady Statistic AS VP Percent: 0.02 %
Brady Statistic AS VS Percent: 96.23 %
Brady Statistic RA Percent Paced: 3.74 %
Date Time Interrogation Session: 20141215083526
Lead Channel Impedance Value: 418 Ohm
Lead Channel Impedance Value: 418 Ohm
Lead Channel Sensing Intrinsic Amplitude: 1.875 mV
Lead Channel Sensing Intrinsic Amplitude: 11.5 mV
Lead Channel Setting Pacing Amplitude: 2.5 V
Lead Channel Setting Pacing Pulse Width: 0.4 ms
Zone Setting Detection Interval: 280 ms
Zone Setting Detection Interval: 350 ms
Zone Setting Detection Interval: 470 ms

## 2013-05-13 ENCOUNTER — Encounter: Payer: Self-pay | Admitting: *Deleted

## 2013-05-21 ENCOUNTER — Encounter: Payer: Self-pay | Admitting: Internal Medicine

## 2013-05-24 ENCOUNTER — Ambulatory Visit (INDEPENDENT_AMBULATORY_CARE_PROVIDER_SITE_OTHER): Payer: Medicare Other | Admitting: *Deleted

## 2013-05-24 DIAGNOSIS — I2589 Other forms of chronic ischemic heart disease: Secondary | ICD-10-CM

## 2013-05-24 DIAGNOSIS — Z95810 Presence of automatic (implantable) cardiac defibrillator: Secondary | ICD-10-CM | POA: Diagnosis not present

## 2013-05-25 ENCOUNTER — Telehealth: Payer: Self-pay | Admitting: Cardiology

## 2013-05-25 NOTE — Telephone Encounter (Signed)
Dr Aundra Dubin recommended pt continue pravastatin 40mg  daily. Pt advised.

## 2013-05-25 NOTE — Telephone Encounter (Signed)
I called the patient for ICM follow up. He did relay to me that he was most recently on pravastatin but was switched to lovastatin due to a cough. The patient was on 80 mg total daily and developed abdominal cramping and pain. He stopped the lovastatin and symptoms resolved within about 4 days. He restarted this at 40 mg and symptoms of GI discomfort reoccured. They resolved about 3-4 days after stopping lovastatin again. The patient states he has restarted Pravastatin at 40 mg daily within the last 2-3 days. He reports he has had side effects from lipitor, crestor, and simvastatin. He is due to see Dr. Aundra Dubin back in March. I advised the patient I would forward this information on to him to see if he has any other recommendations prior to his office visit. The patient is agreeable.

## 2013-05-25 NOTE — Progress Notes (Signed)
EPIC Encounter for ICM Monitoring  Patient Name: Nathan Pruitt is a 68 y.o. male Date: 05/25/2013 Primary Care Physican: Maryella Shivers, MD Primary Cardiologist: Aundra Dubin Electrophysiologist: Allred Dry Weight: 205 lbs       In the past month, have you:  1. Gained more than 2 pounds in a day or more than 5 pounds in a week? no  2. Had changes in your medications (with verification of current medications)? no  3. Had more shortness of breath than is usual for you? no  4. Limited your activity because of shortness of breath? no  5. Not been able to sleep because of shortness of breath? no  6. Had increased swelling in your feet or ankles? no  7. Had symptoms of dehydration (dizziness, dry mouth, increased thirst, decreased urine output) no  8. Had changes in sodium restriction? no  9. Been compliant with medication? Yes   ICM trend:   Follow-up plan: ICM clinic phone appointment: 06/28/13  Copy of note sent to patient's primary care physician, primary cardiologist, and device following physician.  Alvis Lemmings, RN, BSN 05/25/2013 2:25 PM

## 2013-05-26 ENCOUNTER — Other Ambulatory Visit: Payer: Self-pay | Admitting: Cardiology

## 2013-06-08 ENCOUNTER — Encounter: Payer: Self-pay | Admitting: Cardiology

## 2013-06-08 DIAGNOSIS — I472 Ventricular tachycardia: Secondary | ICD-10-CM | POA: Diagnosis not present

## 2013-06-08 DIAGNOSIS — I2589 Other forms of chronic ischemic heart disease: Secondary | ICD-10-CM | POA: Diagnosis not present

## 2013-06-08 DIAGNOSIS — I5023 Acute on chronic systolic (congestive) heart failure: Secondary | ICD-10-CM | POA: Diagnosis not present

## 2013-06-08 DIAGNOSIS — E785 Hyperlipidemia, unspecified: Secondary | ICD-10-CM | POA: Diagnosis not present

## 2013-06-08 DIAGNOSIS — I4729 Other ventricular tachycardia: Secondary | ICD-10-CM | POA: Diagnosis not present

## 2013-06-08 DIAGNOSIS — M069 Rheumatoid arthritis, unspecified: Secondary | ICD-10-CM | POA: Diagnosis not present

## 2013-06-23 ENCOUNTER — Ambulatory Visit: Payer: Medicare Other | Admitting: Cardiology

## 2013-06-28 ENCOUNTER — Encounter: Payer: Medicare Other | Admitting: *Deleted

## 2013-07-09 ENCOUNTER — Encounter: Payer: Self-pay | Admitting: Cardiology

## 2013-07-09 ENCOUNTER — Telehealth: Payer: Self-pay | Admitting: Cardiology

## 2013-07-09 ENCOUNTER — Ambulatory Visit (INDEPENDENT_AMBULATORY_CARE_PROVIDER_SITE_OTHER): Payer: Medicare Other | Admitting: Cardiology

## 2013-07-09 VITALS — BP 110/70 | HR 49 | Ht 71.0 in | Wt 213.0 lb

## 2013-07-09 DIAGNOSIS — I472 Ventricular tachycardia: Secondary | ICD-10-CM

## 2013-07-09 DIAGNOSIS — M069 Rheumatoid arthritis, unspecified: Secondary | ICD-10-CM | POA: Diagnosis not present

## 2013-07-09 DIAGNOSIS — I2510 Atherosclerotic heart disease of native coronary artery without angina pectoris: Secondary | ICD-10-CM | POA: Diagnosis not present

## 2013-07-09 DIAGNOSIS — M545 Low back pain, unspecified: Secondary | ICD-10-CM | POA: Diagnosis not present

## 2013-07-09 DIAGNOSIS — E785 Hyperlipidemia, unspecified: Secondary | ICD-10-CM

## 2013-07-09 DIAGNOSIS — I2589 Other forms of chronic ischemic heart disease: Secondary | ICD-10-CM

## 2013-07-09 DIAGNOSIS — I4729 Other ventricular tachycardia: Secondary | ICD-10-CM

## 2013-07-09 DIAGNOSIS — M159 Polyosteoarthritis, unspecified: Secondary | ICD-10-CM | POA: Diagnosis not present

## 2013-07-09 MED ORDER — LOSARTAN POTASSIUM 50 MG PO TABS: ORAL_TABLET | ORAL | Status: DC

## 2013-07-09 MED ORDER — PRAVASTATIN SODIUM 40 MG PO TABS: 40.0000 mg | ORAL_TABLET | Freq: Every evening | ORAL | Status: DC

## 2013-07-09 NOTE — Telephone Encounter (Signed)
New Message  Pt called states that his insurance will cover losartan as an alternative with 25, 50 and 100 mg strength. 1 pill per day limit. Pt is requesting a call back to discuss.

## 2013-07-09 NOTE — Patient Instructions (Signed)
Your physician wants you to follow-up in: 6 months with Dr McLean. (September 2015) You will receive a reminder letter in the mail two months in advance. If you don't receive a letter, please call our office to schedule the follow-up appointment.  

## 2013-07-09 NOTE — Telephone Encounter (Signed)
New message    pharmacy accepted the lostarn prescription .    Should additional blood test be needed - please mail Rx to the home.

## 2013-07-09 NOTE — Telephone Encounter (Signed)
Spoke with patient. BMET scheduled for next month already.

## 2013-07-09 NOTE — Telephone Encounter (Signed)
Per Dr Orpah Greek can take losartan 25mg  two times a day instead of valsartan 80mg  bid. Pt advised and prescription to pharmacy.

## 2013-07-11 NOTE — Progress Notes (Signed)
Patient ID: Nathan Pruitt, male   DOB: 29-Apr-1946, 68 y.o.   MRN: [id number] PCP: Dr. Maryella Shivers  68 yo with history of CAD, ischemic CMP, and VT s/p ICD placement returns for followup.  He is on amiodarone.  Last echo in 7/12 showed EF 45-50%, mild LVH, akinesis of the basal to mid inferoposterior wall.  Symptomatically, he has been stable.  He is not very active.  No chest pain.  Weight down 4 lbs.  He is still short of breath with heavier activity such walking up a hill.  He is now on pravastatin 40 mg daily (unable to tolerate 80 mg daily due to myalgias).  He has occasional mild lightheadedness if he stands too fast.  He also needs a replacement for valsartan due to cost.   Labs (11/10): K 4.9, creatinine 1.4, LDL 70, HDL 31, LFTs normal Labs (3/11): K 4.4, creatinine 1.3, LDL 61, HDL 35 Labs (7/11): LDL 62, HDL 23 Labs (1/12): K 4.8, LFTs normal, creatinine 1.44, LDL 69, HDL 33 Labs (7/12): LFTs normal, LDL 81, HDL 36, HCT normal, K 4.3, creatinine 1.3, BNP 116, TSH normal Labs (1/13): LDL 39, HDl 31, LFTs normal, TSH  Labs (7/13): TSH normal, LFTs normal, LDL 69, HDL 40 Labs (8/13): K 4.5, creatinine 1.6 Labs (10/13): K 4.4, creatinine 1.4 Labs (3/14): LDL 108, HDL 30, LFTs normal Labs (9/14): K 4.2, creatinine 1.6, LFTs normal, LDL 114, TSH 10.6 (elevated), free T4/free T3 normal Labs (2/15): K 4.6, creatinine 1.5, LFTs normal, TSH 7.6 (elevated), free T4 and free T3 normal  ECG: a-paced, v-sensed with old inferior MI  Allergies (verified):  No Known Drug Allergies  Past Medical History: 1. Coronary artery disease.       a.     The patient reports history of silent MI in 1993.  This was likely an inferior MI (see below).       b.     The patient reports history of 2-D echocardiogram in March        2009, showing an EF of 40%.       c.     The patient reports history of Myoview in [facility] in March 2009, which        was per his report normal.       d.     The patient  presented to [facility] in 5/10 with VT and mildly elevated cardiac enzymes.         LHC (5/10):  Inferobasal dyskinesis with EF 35-40%.  There was chronic total occlusion of the         mid RCA with good collaterals.  Luminals LCA.  This did not appear to be an acute cause of the             5/10 event.  2. Hypertension.  3. Hyperlipidemia.  4. Remote tobacco abuse with 47-pack-year history, quitting in August 2009.  5. Peripheral arterial disease.       a.     Status post left-to-right fem-fem bypass performed in        [place] in March 2009.       b.     Status post redo left-to-right fem-fem bypass performed by        Dr. Scot Dock at [facility] in 2009.  6. Rheumatoid arthritis, on leflunomide.  7.  Ischemic cardiomyopathy:  EF 35-40% by LV-gram 5/10 with inferobasal dyskinesis.  Echo 5/10 showed EF 40% with mild LVH, no significant  MR, inferobasal and posterobasal akinesis.  Echo (7/11): EF 50%, mild LVH, basal-mid inferoposterior akinesis.  Echo (7/12): EF 45-50% with basal anterolateral, basal posterior, and basal to mid inferior akinesis.   8.  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  He has a dual chamber Medtronic ICD.  9.  Atrial flutter:  Status post isthmus ablation 5/10.   10.  PFTs (7/11): FVC 74%, FEV1 80%, ratio 75%, TLC 78%, DLCO 68%.  This suggests a mild restrictive defect and a mild obstructive defect.  He did have response to bronchodilator. These PFTs were significantly better than the report from Dr. Alcide Clever in [place] done prior.  He had last PFTs in 2/12 with no significant change.  11.  Anxiety 12.  Chronic cough: No relief with change from ACEI to ARB or with trial of PPI.   Family History: Mother died in her late 53s with gastric cancer.  Father died in his late 39s with throat cancer and PVD.   He had a brother who died at 62 of an MI.   Social History: Retired--telephone company.  Originally from  [place].  Single  Tobacco Use - Former. -47ppy hx, quit 2009.  Alcohol Use - yes-minimal Regular Exercise - yes Drug Use - no (prior)  Review of Systems        All systems reviewed and negative except as per HPI.   Current Outpatient Prescriptions  Medication Sig Dispense Refill  . amiodarone (PACERONE) 200 MG tablet TAKE 1 TABLET EVERY DAY  90 tablet  3  . aspirin EC 81 MG tablet Take 1 tablet (81 mg total) by mouth daily.      . carvedilol (COREG) 12.5 MG tablet Take 1 tablet (12.5 mg total) by mouth 2 (two) times daily with a meal.  180 tablet  6  . cyclobenzaprine (FLEXERIL) 5 MG tablet Take 5 mg by mouth 3 (three) times daily as needed for muscle spasms.      [name] leflunomide (ARAVA) 20 MG tablet Take 20 mg by mouth daily.        [name] torsemide (DEMADEX) 20 MG tablet TAKE 1 TABLET EVERY DAY  90 tablet  1  . losartan (COZAAR) 50 MG tablet 1/2 tablet (25mg ) two times a day  90 tablet  3  . pravastatin (PRAVACHOL) 40 MG tablet Take 1 tablet (40 mg total) by mouth every evening.  90 tablet  3   No current facility-administered medications for this visit.    BP 110/70  Pulse 49  Ht 5\' 11"  (1.803 m)  Wt 96.616 kg (213 lb)  BMI 29.72 kg/m2 General:  Well developed, well nourished, in no acute distress. Neck:  Neck supple, no JVD. No masses, thyromegaly or abnormal cervical nodes. Lungs:  Slightly decreased breath sounds bilaterally.  Heart:  Non-displaced PMI, chest non-tender; regular rate and rhythm, S1, S2 without murmurs, rubs or gallops. Carotid upstroke normal, no bruit. Normal abdominal aortic size, no bruits. Femorals normal pulses, no bruits. Pedals normal pulses. No edema, no varicosities. Abdomen:  Bowel sounds positive; abdomen soft and non-tender without masses, organomegaly, or hernias noted. No hepatosplenomegaly. Extremities:  No clubbing or cyanosis. Neurologic:  Alert and oriented x 3. Psych:  Normal affect.  Assessment/Plan:  CARDIOMYOPATHY, ISCHEMIC Patient  does not appear volume overloaded on exam and has stable NYHA class II-III dyspnea.  Last EF by echo was 45-50%.  Stable CKD.  - Continue current torsemide and Coreg. - I will stop  valsartan (not coverted by insurance) and start losartan 25 mg bid.  CAD, NATIVE VESSEL No chest pain. Continue ASA, statin, Coreg, ARB.  HYPERLIPIDEMIA  Myalgias with Lipitor and Crestor.  Livalo tolerated but too expensive.  Now on pravastatin 40 mg daily. LDL higher than goal but he had myalgias with 80 mg pravastatin.  At next appt will consider addition of Zetia, but cost may be prohibitive.  VENTRICULAR TACHYCARDIA  Continue amiodarone and Coreg. Also has ICD.  Recent LFTs normal.  TSH was high when checked last month but free T3 and T4 normal.  Will need to follow thyroid indices carefully for overt hypothyroidism.  Given VT, I think he needs to stay on amiodarone. He knows to have a yearly eye exam.  PAD He denies claudication.  Given history, should arrange ABIs at next appointment.    Loralie Champagne 07/11/2013

## 2013-07-21 ENCOUNTER — Encounter (HOSPITAL_COMMUNITY): Payer: Medicare Other

## 2013-07-21 ENCOUNTER — Ambulatory Visit: Payer: Medicare Other | Admitting: Family

## 2013-07-26 ENCOUNTER — Encounter: Payer: Self-pay | Admitting: Family

## 2013-07-27 ENCOUNTER — Ambulatory Visit (INDEPENDENT_AMBULATORY_CARE_PROVIDER_SITE_OTHER): Payer: Medicare Other | Admitting: Family

## 2013-07-27 ENCOUNTER — Ambulatory Visit (HOSPITAL_COMMUNITY)
Admission: RE | Admit: 2013-07-27 | Discharge: 2013-07-27 | Disposition: A | Discharge status: Home / Self Care | Payer: Medicare Other | Source: Ambulatory Visit | Attending: Family | Admitting: Family

## 2013-07-27 ENCOUNTER — Encounter: Payer: Self-pay | Admitting: Family

## 2013-07-27 VITALS — BP 100/74 | HR 57 | Resp 14 | Ht 70.5 in | Wt 211.0 lb

## 2013-07-27 DIAGNOSIS — Z48812 Encounter for surgical aftercare following surgery on the circulatory system: Secondary | ICD-10-CM | POA: Diagnosis not present

## 2013-07-27 DIAGNOSIS — I739 Peripheral vascular disease, unspecified: Secondary | ICD-10-CM

## 2013-07-27 NOTE — Progress Notes (Signed)
[facility] [place] HISTORY AND PHYSICAL -PAD  History of Present Illness Nathan Pruitt is a 68 y.o. male patient who is s/p redo left to right fem-fem graft 12/30/07 by Dr. Scot Dock. He returns today for routine surveillance. He denies claudication symptoms, denies non healing wounds.  Has a defibrillator in place, he denies history of stroke or TIA.  The patient denies New Medical or Surgical History  Pt Diabetic: No Pt smoker: former smoker, quit in 2010  Pt meds include: Statin :Yes ASA: Yes Other anticoagulants/antiplatelets: no  Past Medical History  Diagnosis Date  . CAD (coronary artery disease)     hx of silent MI in 1993. likely an inferior MI. hx of 2D cardiogram in 3/09 showing EF of 40%. hx of myoview in [place] 3/09-nml. presented to [facility] 5/10 with VT and mildly elevated cardiac enzymes LHC (5/10): inferobasal dyskinesis with EF 35-40%. was chronic total occlusion of mid RCA with good collaterals. luminals LCA. this does not appear to be an acute cause of the 5/10 event  . HTN (hypertension)   . HLD (hyperlipidemia)   . Tobacco abuse     47 pack year hx; quit 8/09  . PAD (peripheral artery disease)     s/p L-to-R fem-fem bypass performed by Dr. Scot Dock at [facility] in 2009   . Rheumatoid arthritis(714.0)     on leflunomide  . Ischemic cardiomyopathy     EF 35-40% by LV-gram 5/10 with inferobasal dyskinesis. echo 5/10 showed EF 40% w/mild LVH, no sig. MR, inferobasal and posterobasal akinesis. echo (7/11): EF 50%, mild LVH, basal-mid inferoposterior akenesis  . 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 He has duel chamber Medtronic ICD  . Atrial flutter     s/p isthmus ablation 5/10  . Abnormal PFTs 7/11    FVC 74%, FEV1 80%, ratio 75%, TLC 78%, DLCO 68%. this suggests a mild restrictive and obstructive defect. pt did have a response to bronchodilator. these PFTs were significantly better  than the report from Dr. Alcide Clever in [place] done prior.   [name] Anxiety   . Silent myocardial infarction 1993    Social History History  Substance Use Topics  . Smoking status: Former Smoker -- 43 years  . Smokeless tobacco: Not on file     Comment: QUIT 2009  . Alcohol Use: Yes     Comment: minimal     Family History Family History  Problem Relation Age of Onset  . Gastric cancer Mother   . Throat cancer Father   . Peripheral vascular disease Father   . Heart attack Brother 29    Past Surgical History  Procedure Laterality Date  . Vasectomy    . Tonsillectomy    . Dual chamber icd      Medtronic; [facility] - yes   . Femoral artery - femoral artery bypass graft  march 2009    left to right bypass, first @ [facility], second at [facility] by Dr Scot Dock  . Ablation      Allergies  Allergen Reactions  . Atorvastatin Other (See Comments)    Muscle pain  . Crestor [Rosuvastatin Calcium] Other (See Comments)    Muscle pain  . Losartan Other (See Comments)    Muscle pain     Current Outpatient Prescriptions  Medication Sig Dispense Refill  . amiodarone (PACERONE) 200 MG tablet TAKE 1 TABLET EVERY DAY  90 tablet  3  .  aspirin EC 81 MG tablet Take 1 tablet (81 mg total) by mouth daily.      . carvedilol (COREG) 12.5 MG tablet Take 1 tablet (12.5 mg total) by mouth 2 (two) times daily with a meal.  180 tablet  6  . cyclobenzaprine (FLEXERIL) 5 MG tablet Take 5 mg by mouth 3 (three) times daily as needed for muscle spasms.      [name] leflunomide (ARAVA) 20 MG tablet Take 20 mg by mouth daily.        [name] losartan (COZAAR) 50 MG tablet 1/2 tablet (25mg ) two times a day  90 tablet  3  . pravastatin (PRAVACHOL) 40 MG tablet Take 1 tablet (40 mg total) by mouth every evening.  90 tablet  3  . torsemide (DEMADEX) 20 MG tablet TAKE 1 TABLET EVERY DAY  90 tablet  1   No current facility-administered medications for this visit.    ROS: See HPI for pertinent positives and  negatives.   Physical Examination  Filed Vitals:   07/27/13 0939  BP: 100/74  Pulse: 57  Resp: 14   Filed Weights   07/27/13 0939  Weight: 211 lb (95.709 kg)    Body mass index is 29.84 kg/(m^2).  General: A&O x 3, WDWN, . Gait: normal Eyes: PERRLA. Pulmonary: CTAB, without wheezes , rales or rhonchi. Cardiac: regular Rythm , without detected murmur. Defibrillator palpated left side of chest.        Carotid Bruits Left Right   Negative Negative  Aorta is not palpable. Radial pulses: 2+ palpable and =                           VASCULAR EXAM: Extremities without ischemic changes  without Gangrene; without open wounds.                                                                                                          LE Pulses LEFT RIGHT       FEMORAL   palpable   palpable        POPLITEAL  not palpable   not palpable       POSTERIOR TIBIAL   palpable    palpable        DORSALIS PEDIS      ANTERIOR TIBIAL not palpable   palpable    Abdomen: soft, NT, no masses. Skin: no rashes, no ulcers noted. Musculoskeletal: no muscle wasting or atrophy.  Neurologic: A&O X 3; Appropriate Affect ; SENSATION: normal; MOTOR FUNCTION:  moving all extremities equally, motor strength 5/5 throughout. Speech is fluent/normal. CN 2-12 intact.    Non-Invasive Vascular Imaging: DATE: 07/27/2013 ABI: RIGHT 1.09, Waveforms: triphasic;  LEFT 0.99, Waveforms: triphasic Previous (07/18/2011) ABI's: Right: 1.02, Left: 0.99.  ASSESSMENT: Nathan Pruitt is a 68 y.o. male who is s/p redo left to right fem-fem graft 12/30/07 presents with normal ABI's and no claudication symptoms.   PLAN:  I discussed in depth with the patient the nature of atherosclerosis, and emphasized the importance of maximal  medical management including strict control of blood pressure, blood glucose, and lipid levels, obtaining regular exercise, and continued cessation of smoking.  The patient is aware that  without maximal medical management the underlying atherosclerotic disease process will progress, limiting the benefit of any interventions.  Based on the patient's vascular studies and examination, pt will return to clinic in 1 year for ABI's.  The patient was given information about PAD including signs, symptoms, treatment, what symptoms should prompt the patient to seek immediate medical care, and risk reduction measures to take.  Clemon Chambers, RN, MSN, FNP-C [facility] Phone: [phone]  Clinic MD: Early  07/27/2013 9:29 AM

## 2013-07-27 NOTE — Patient Instructions (Signed)
Peripheral Vascular Disease Peripheral Vascular Disease (PVD), also called Peripheral Arterial Disease (PAD), is a circulation problem caused by cholesterol (atherosclerotic plaque) deposits in the arteries. PVD commonly occurs in the lower extremities (legs) but it can occur in other areas of the body, such as your arms. The cholesterol buildup in the arteries reduces blood flow which can cause pain and other serious problems. The presence of PVD can place a person at risk for Coronary Artery Disease (CAD).  CAUSES  Causes of PVD can be many. It is usually associated with more than one risk factor such as:   High Cholesterol.  Smoking.  Diabetes.  Lack of exercise or inactivity.  High blood pressure (hypertension).  Obesity.  Family history. SYMPTOMS   When the lower extremities are affected, patients with PVD may experience:  Leg pain with exertion or physical activity. This is called INTERMITTENT CLAUDICATION. This may present as cramping or numbness with physical activity. The location of the pain is associated with the level of blockage. For example, blockage at the abdominal level (distal abdominal aorta) may result in buttock or hip pain. Lower leg arterial blockage may result in calf pain.  As PVD becomes more severe, pain can develop with less physical activity.  In people with severe PVD, leg pain may occur at rest.  Other PVD signs and symptoms:  Leg numbness or weakness.  Coldness in the affected leg or foot, especially when compared to the other leg.  A change in leg color.  Patients with significant PVD are more prone to ulcers or sores on toes, feet or legs. These may take longer to heal or may reoccur. The ulcers or sores can become infected.  If signs and symptoms of PVD are ignored, gangrene may occur. This can result in the loss of toes or loss of an entire limb.  Not all leg pain is related to PVD. Other medical conditions can cause leg pain such  as:  Blood clots (embolism) or Deep Vein Thrombosis.  Inflammation of the blood vessels (vasculitis).  Spinal stenosis. DIAGNOSIS  Diagnosis of PVD can involve several different types of tests. These can include:  Pulse Volume Recording Method (PVR). This test is simple, painless and does not involve the use of X-rays. PVR involves measuring and comparing the blood pressure in the arms and legs. An ABI (Ankle-Brachial Index) is calculated. The normal ratio of blood pressures is 1. As this number becomes smaller, it indicates more severe disease.  < 0.95  indicates significant narrowing in one or more leg vessels.  <0.8 there will usually be pain in the foot, leg or buttock with exercise.  <0.4 will usually have pain in the legs at rest.  <0.25  usually indicates limb threatening PVD.  Doppler detection of pulses in the legs. This test is painless and checks to see if you have a pulses in your legs/feet.  A dye or contrast material (a substance that highlights the blood vessels so they show up on x-ray) may be given to help your caregiver better see the arteries for the following tests. The dye is eliminated from your body by the kidney's. Your caregiver may order blood work to check your kidney function and other laboratory values before the following tests are performed:  Magnetic Resonance Angiography (MRA). An MRA is a picture study of the blood vessels and arteries. The MRA machine uses a large magnet to produce images of the blood vessels.  Computed Tomography Angiography (CTA). A CTA is a   specialized x-ray that looks at how the blood flows in your blood vessels. An IV may be inserted into your arm so contrast dye can be injected.  Angiogram. Is a procedure that uses x-rays to look at your blood vessels. This procedure is minimally invasive, meaning a small incision (cut) is made in your groin. A small tube (catheter) is then inserted into the artery of your groin. The catheter is  guided to the blood vessel or artery your caregiver wants to examine. Contrast dye is injected into the catheter. X-rays are then taken of the blood vessel or artery. After the images are obtained, the catheter is taken out. TREATMENT  Treatment of PVD involves many interventions which may include:  Lifestyle changes:  Quitting smoking.  Exercise.  Following a low fat, low cholesterol diet.  Control of diabetes.  Foot care is very important to the PVD patient. Good foot care can help prevent infection.  Medication:  Cholesterol-lowering medicine.  Blood pressure medicine.  Anti-platelet drugs.  Certain medicines may reduce symptoms of Intermittent Claudication.  Interventional/Surgical options:  Angioplasty. An Angioplasty is a procedure that inflates a balloon in the blocked artery. This opens the blocked artery to improve blood flow.  Stent Implant. A wire mesh tube (stent) is placed in the artery. The stent expands and stays in place, allowing the artery to remain open.  Peripheral Bypass Surgery. This is a surgical procedure that reroutes the blood around a blocked artery to help improve blood flow. This type of procedure may be performed if Angioplasty or stent implants are not an option. SEEK IMMEDIATE MEDICAL CARE IF:   You develop pain or numbness in your arms or legs.  Your arm or leg turns cold, becomes blue in color.  You develop redness, warmth, swelling and pain in your arms or legs. MAKE SURE YOU:   Understand these instructions.  Will watch your condition.  Will get help right away if you are not doing well or get worse. Document Released: 05/30/2004 Document Revised: 07/15/2011 Document Reviewed: 04/26/2008 ExitCare Patient Information 2014 [employer].  

## 2013-07-28 ENCOUNTER — Ambulatory Visit (INDEPENDENT_AMBULATORY_CARE_PROVIDER_SITE_OTHER): Payer: Medicare Other | Admitting: Internal Medicine

## 2013-07-28 ENCOUNTER — Encounter: Payer: Self-pay | Admitting: Internal Medicine

## 2013-07-28 VITALS — BP 106/70 | HR 52 | Ht 71.0 in | Wt 210.2 lb

## 2013-07-28 DIAGNOSIS — I472 Ventricular tachycardia: Secondary | ICD-10-CM

## 2013-07-28 DIAGNOSIS — I739 Peripheral vascular disease, unspecified: Secondary | ICD-10-CM

## 2013-07-28 DIAGNOSIS — Z79899 Other long term (current) drug therapy: Secondary | ICD-10-CM

## 2013-07-28 DIAGNOSIS — I4729 Other ventricular tachycardia: Secondary | ICD-10-CM | POA: Diagnosis not present

## 2013-07-28 DIAGNOSIS — I2589 Other forms of chronic ischemic heart disease: Secondary | ICD-10-CM

## 2013-07-28 DIAGNOSIS — I2510 Atherosclerotic heart disease of native coronary artery without angina pectoris: Secondary | ICD-10-CM

## 2013-07-28 LAB — MDC_IDC_ENUM_SESS_TYPE_INCLINIC
Battery Voltage: 3.03 V
Brady Statistic AP VP Percent: 0.03 %
Brady Statistic AS VP Percent: 0.03 %
Brady Statistic RA Percent Paced: 6.45 %
Brady Statistic RV Percent Paced: 0.06 %
Date Time Interrogation Session: 20150325114831
HIGH POWER IMPEDANCE MEASURED VALUE: 60 Ohm
HighPow Impedance: 47 Ohm
Lead Channel Impedance Value: 437 Ohm
Lead Channel Impedance Value: 437 Ohm
Lead Channel Pacing Threshold Amplitude: 1.25 V
Lead Channel Pacing Threshold Pulse Width: 0.4 ms
Lead Channel Sensing Intrinsic Amplitude: 1.875 mV
Lead Channel Setting Pacing Pulse Width: 0.4 ms
MDC IDC MSMT LEADCHNL RA PACING THRESHOLD AMPLITUDE: 0.75 V
MDC IDC MSMT LEADCHNL RV PACING THRESHOLD PULSEWIDTH: 0.4 ms
MDC IDC MSMT LEADCHNL RV SENSING INTR AMPL: 11.25 mV
MDC IDC SET LEADCHNL RA PACING AMPLITUDE: 2 V
MDC IDC SET LEADCHNL RV PACING AMPLITUDE: 2.5 V
MDC IDC SET LEADCHNL RV SENSING SENSITIVITY: 0.3 mV
MDC IDC SET ZONE DETECTION INTERVAL: 350 ms
MDC IDC SET ZONE DETECTION INTERVAL: 450 ms
MDC IDC SET ZONE DETECTION INTERVAL: 470 ms
MDC IDC STAT BRADY AP VS PERCENT: 6.41 %
MDC IDC STAT BRADY AS VS PERCENT: 93.53 %
Zone Setting Detection Interval: 280 ms

## 2013-07-28 LAB — HEPATIC FUNCTION PANEL
ALK PHOS: 80 U/L (ref 39–117)
ALT: 15 U/L (ref 0–53)
AST: 17 U/L (ref 0–37)
Albumin: 3.4 g/dL — ABNORMAL LOW (ref 3.5–5.2)
BILIRUBIN TOTAL: 0.5 mg/dL (ref 0.3–1.2)
Bilirubin, Direct: 0.1 mg/dL (ref 0.0–0.3)
Total Protein: 6.8 g/dL (ref 6.0–8.3)

## 2013-07-28 LAB — BASIC METABOLIC PANEL
BUN: 20 mg/dL (ref 6–23)
CALCIUM: 8.6 mg/dL (ref 8.4–10.5)
CHLORIDE: 103 meq/L (ref 96–112)
CO2: 26 meq/L (ref 19–32)
CREATININE: 1.5 mg/dL (ref 0.4–1.5)
GFR: 50.54 mL/min — ABNORMAL LOW (ref >60.00)
Glucose, Bld: 101 mg/dL — ABNORMAL HIGH (ref 70–99)
Potassium: 3.6 mEq/L (ref 3.5–5.1)
Sodium: 137 mEq/L (ref 135–145)

## 2013-07-28 LAB — MAGNESIUM: Magnesium: 1.9 mg/dL (ref 1.5–2.5)

## 2013-07-28 LAB — TSH: TSH: 5.5 u[IU]/mL (ref 0.35–5.50)

## 2013-07-28 NOTE — Progress Notes (Signed)
PCP:  HODGES,FRANCISCO, MD Primary Cardiologist:  Dr Aundra Dubin  The patient presents today for routine electrophysiology followup.  Since last being seen in our clinic, the patient reports doing reasonably well. He started sulfasalazine in early March.  After about 9 days, he felt very "jittery and anxious".  He stopped the medicine on 07/18/13 and has had no further episodes.  Interestingly, his symptoms on 07/18/13 correspond to VT with CL 320 msec for which he received successful ATP therapy.  Today, he denies symptoms of chest pain, shortness of breath, orthopnea, PND, lower extremity edema, dizziness, presyncope, syncope, or neurologic sequela.  He has received no recent ICD shocks.  The patient feels that he is tolerating medications without difficulties and is otherwise without complaint today.   Past Medical History  Diagnosis Date  . CAD (coronary artery disease)     hx of silent MI in 1993. likely an inferior MI. hx of 2D cardiogram in 3/09 showing EF of 40%. hx of myoview in [place] 3/09-nml. presented to [facility] 5/10 with VT and mildly elevated cardiac enzymes LHC (5/10): inferobasal dyskinesis with EF 35-40%. was chronic total occlusion of mid RCA with good collaterals. luminals LCA. this does not appear to be an acute cause of the 5/10 event  . HTN (hypertension)   . HLD (hyperlipidemia)   . Tobacco abuse     47 pack year hx; quit 8/09  . PAD (peripheral artery disease)     s/p L-to-R fem-fem bypass performed by Dr. Scot Dock at [facility] in 2009   . Rheumatoid arthritis(714.0)     on leflunomide  . Ischemic cardiomyopathy     EF 35-40% by LV-gram 5/10 with inferobasal dyskinesis. echo 5/10 showed EF 40% w/mild LVH, no sig. MR, inferobasal and posterobasal akinesis. echo (7/11): EF 50%, mild LVH, basal-mid inferoposterior akenesis  . 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 He has duel chamber Medtronic ICD  . Atrial flutter     s/p  isthmus ablation 5/10  . Abnormal PFTs 7/11    FVC 74%, FEV1 80%, ratio 75%, TLC 78%, DLCO 68%. this suggests a mild restrictive and obstructive defect. pt did have a response to bronchodilator. these PFTs were significantly better than the report from Dr. Alcide Clever in [place] done prior.   [name] Anxiety   . Silent myocardial infarction 1993  . CHF (congestive heart failure)    Past Surgical History  Procedure Laterality Date  . Vasectomy    . Tonsillectomy    . Dual chamber icd  2010    Medtronic; [facility] - yes   ( Defibulator Implant )  . Femoral artery - femoral artery bypass graft  march 2009    left to right bypass, first @ [facility], second at [facility] by Dr Scot Dock  . Ablation    . Cardiac defibrillator placement      Current Outpatient Prescriptions  Medication Sig Dispense Refill  . amiodarone (PACERONE) 200 MG tablet TAKE 1 TABLET EVERY DAY  90 tablet  3  . aspirin EC 81 MG tablet Take 1 tablet (81 mg total) by mouth daily.      . carvedilol (COREG) 12.5 MG tablet Take 1 tablet (12.5 mg total) by mouth 2 (two) times daily with a meal.  180 tablet  6  . cyclobenzaprine (FLEXERIL) 5 MG tablet Take 5 mg by mouth 3 (three) times daily as needed for muscle spasms.      [name]  leflunomide (ARAVA) 20 MG tablet Take 20 mg by mouth daily.        [name] losartan (COZAAR) 50 MG tablet 1/2 tablet (25mg ) two times a day  90 tablet  3  . pravastatin (PRAVACHOL) 40 MG tablet Take 1 tablet (40 mg total) by mouth every evening.  90 tablet  3  . torsemide (DEMADEX) 20 MG tablet TAKE 1 TABLET EVERY DAY  90 tablet  1   No current facility-administered medications for this visit.    Allergies  Allergen Reactions  . Atorvastatin Other (See Comments)    Muscle pain  . Crestor [Rosuvastatin Calcium] Other (See Comments)    Muscle pain  . Losartan Other (See Comments)    Muscle pain     History   Social History  . Marital Status: Single    Spouse Name: N/A    Number of Children:  N/A  . Years of Education: N/A   Occupational History  . Not on file.   Social History Main Topics  . Smoking status: Former Smoker -- 51 years  . Smokeless tobacco: Never Used     Comment: QUIT 2009  . Alcohol Use: Yes     Comment: minimal   . Drug Use: No     Comment: prior   . Sexual Activity: Not on file   Other Topics Concern  . Not on file   Social History Narrative   Single; gets minimal exercise; 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 originally from [place]    Physical Exam: Filed Vitals:   07/28/13 0938  BP: 106/70  Pulse: 52  Height: 5\' 11"  (1.803 m)  Weight: 210 lb 3.2 oz (95.346 kg)    GEN- The patient is anxious appearing, alert and oriented x 3 today.   Head- normocephalic, atraumatic Eyes-  Sclera clear, conjunctiva pink Ears- hearing intact Oropharynx- clear Neck- supple, no JVP Lymph- no cervical lymphadenopathy Lungs- Clear to ausculation bilaterally, normal work of breathing Chest- ICD pocket is well healed Heart- Regular rate and rhythm, no murmurs, rubs or gallops, PMI not laterally displaced GI- soft, NT, ND, + BS Extremities- no clubbing, cyanosis, or edema  ICD interrogation- reviewed in detail today,  See PACEART report  Assessment and Plan:  1. VT He had 7 episodes of VT on 07/18/13 with CL of 320 msec for which ATP therapy was successful.  I can not be sure that this was due to sulfasalazine.  He has stopped this medicine and had no further trouble.  I will make no changes today. Normal ICD function See Pace Art report No changes today Check bmet and mg today Check lfts/tfts today  2. CAD/ischemic CM Stable No change required today I will enroll in the [facility] clinic with Alvis Lemmings.  Though he was initially reluctant to do this, he is now more accepting of the plan.  He would like for her to call him again in 4 weeks.

## 2013-07-28 NOTE — Patient Instructions (Signed)
Your physician wants you to follow-up in: 12 months with Dr. Rayann Heman.  You will receive a reminder letter in the mail two months in advance. If you don't receive a letter, please call our office to schedule the follow-up appointment.  Remote monitoring is used to monitor your Pacemaker or ICD from home.  This monitoring reduces the number of office visits required to check your device to one time per year. It allows [place] to keep an eye on the functioning of your device to ensure it is working properly. You are scheduled for a device check from home on  November 01, 2013. You may send your transmission at any time that day. If you have a wireless device, the transmission will be sent automatically. After your physician reviews your transmission, you will receive a postcard with your next transmission date.

## 2013-08-02 ENCOUNTER — Encounter: Payer: Self-pay | Admitting: *Deleted

## 2013-08-02 ENCOUNTER — Telehealth: Payer: Self-pay | Admitting: Internal Medicine

## 2013-08-02 NOTE — Telephone Encounter (Signed)
Have him call back to let us know how he is doing.

## 2013-08-02 NOTE — Telephone Encounter (Signed)
The patient's transmission came through. I reviewed this with Erasmo Downer in the device clinic. No episodes noted. I have notified the patient of this. He will hold his losartan for a couple of days to see if his symptoms of nausea improve. If they do not, he will restart losartan and hold pravastatin. The patient states again that he has had side effects from higher doses of losartan. His insurance will not approve valsartan. He was on diovan, but this was more expensive for him. He has also tried multiple statin drugs and the only one he tolerated well was simvastatin. He unfortunately cannot take that at the 80 mg strength he was on due to amiodarone. I advised the patient to call back and let us know how he feels off of his medication so a decision can be made about a replacement. I have also encouraged that he check his BP while off of the losartan. I will forward this to Dr. Aundra Dubin and Webb Silversmith to follow when the patient calls back. I will continue to follow him in the [facility] clinic. Next transmission will be on 09/02/13.

## 2013-08-02 NOTE — Telephone Encounter (Signed)
I spoke with the patient this morning to re-enroll in the [facility] clinic. He states that he was not feeling well over the weekend. He was having a lot of nausea with a small amount of dizziness associated with this. He states he is unsure if he was having another episode of VT, or if he is having a reaction to the losartan that he is taking. He states he tried this before and had some similar symptoms, but the dose has been lowered. He did not check his BP this weekend. He recently had VT with ATP around 3/15. He is concerned about further episodes. I have advised him to send in a manual transmission today and we can review. I will be back in touch with him after the transmission has been received. He is agreeable. Alvis Lemmings, RN, BSN  I will forward to the device clinic as an FYI. Alvis Lemmings, RN, BSN

## 2013-08-05 NOTE — Telephone Encounter (Signed)
I had already advised the patient to call back with an update on how he is feeling. Will forward to Webb Silversmith to await a call back from the patient.

## 2013-08-12 ENCOUNTER — Telehealth: Payer: Self-pay | Admitting: Cardiology

## 2013-08-12 MED ORDER — TELMISARTAN 40 MG PO TABS: 40.0000 mg | ORAL_TABLET | Freq: Every day | ORAL | Status: DC

## 2013-08-12 NOTE — Telephone Encounter (Signed)
Pt notified of instructions from Dr. Aundra Dubin. He will stop losartan and start telmisartan 40 mg daily. Will send prescription for 90 day supply to [facility] in [place].  He will have lab work done on August 26, 2013 at [facility]. I will mail order for lab work to him.

## 2013-08-12 NOTE — Telephone Encounter (Signed)
May start telmisartan 40 mg daily with BMET in 2 wks.

## 2013-08-12 NOTE — Telephone Encounter (Signed)
New message     Pt cannot take losartin potassium (muscle pain and increased bp).  Ins will not cover diavan.  Insurance will pay for telmisartin.  Do you think this will work?

## 2013-08-12 NOTE — Telephone Encounter (Signed)
Spoke with pt who reports he has had trouble with losartan in the past. He recently started having nausea on this medication. Per phone note dated 3/30 pt stopped losartan for a few days to see if this would help with nausea.  Nausea improved off losartan. He has since resumed at half normal dose. He is taking 25 mg daily. He reports he is now having muscle pain which he attributes to losartan. No nausea.  He checked blood pressure while on phone with me and it is 159/89 and heart rate 57.  He reports recent readings have been 150/80-90.  Insurance will cover telmisartan. He is asking if he can be changed to this medication.

## 2013-08-13 NOTE — Telephone Encounter (Signed)
Pt advised I have received and completed form for Tier exception for Diovan. Will ask Dr Aundra Dubin to sign and fax Monday.

## 2013-08-13 NOTE — Telephone Encounter (Signed)
New message    Patient calling  Wanted to speak with Dr. Aundra Dubin nurse stating he called his  insurance provider requesting a tier review . Due to cost.   The office will be faxing over a form.

## 2013-08-13 NOTE — Telephone Encounter (Signed)
Spoke with patient.

## 2013-08-13 NOTE — Telephone Encounter (Signed)
Pt began Telmisartan yesterday. He is going to request a Tier exception for Diovan in case he does not tolerate Telmisartan. He has been unable to take losartan because pf muscle pain and nausea. He has been unable to take valsartan/HCT because of lightheadedness and nausea. He tolerated Diovan without problems but it is Tier 4 on his current insurance. He will continue Telmisartan if he is able to tolerate it, but wants to go ahead and get Tier exception for Divovan just in case he cannot take Telmisartan.  Pt advised I have not received fax requesting Tier exception.

## 2013-08-16 NOTE — Telephone Encounter (Signed)
Dr Aundra Dubin signed Tier exception for Diovan, have forwarded to HIM to be faxed to [facility]

## 2013-08-23 ENCOUNTER — Other Ambulatory Visit: Payer: Self-pay | Admitting: *Deleted

## 2013-08-30 DIAGNOSIS — R972 Elevated prostate specific antigen [PSA]: Secondary | ICD-10-CM | POA: Diagnosis not present

## 2013-08-30 DIAGNOSIS — R351 Nocturia: Secondary | ICD-10-CM | POA: Diagnosis not present

## 2013-08-30 DIAGNOSIS — N390 Urinary tract infection, site not specified: Secondary | ICD-10-CM | POA: Diagnosis not present

## 2013-08-30 DIAGNOSIS — N400 Enlarged prostate without lower urinary tract symptoms: Secondary | ICD-10-CM | POA: Diagnosis not present

## 2013-08-30 DIAGNOSIS — N402 Nodular prostate without lower urinary tract symptoms: Secondary | ICD-10-CM | POA: Diagnosis not present

## 2013-09-01 ENCOUNTER — Telehealth: Payer: Self-pay | Admitting: *Deleted

## 2013-09-01 NOTE — Telephone Encounter (Signed)
Message copied by Katrine Coho on Wed Sep 01, 2013 10:52 AM ------      Message from: Larey Dresser      Created: Mon Aug 23, 2013 11:11 PM       BMET about 2 wks after going on Diovan.      ----- Message -----         From: Katrine Coho, RN         Sent: 08/23/2013   3:14 PM           To: Larey Dresser, MD            He is back on diovan since insurance company granted tier exception for him. He had taken it before without problems. Does he need to have a BMET checked? He had been on telmisartan and changed to diovan when he got tier approval.        ------

## 2013-09-01 NOTE — Telephone Encounter (Signed)
Pt advised, will mail order to pt to have BMET about 2 weeks after starting Diovan on 08/20/13.

## 2013-09-02 ENCOUNTER — Ambulatory Visit (INDEPENDENT_AMBULATORY_CARE_PROVIDER_SITE_OTHER): Payer: Medicare Other | Admitting: *Deleted

## 2013-09-02 ENCOUNTER — Encounter: Payer: Self-pay | Admitting: *Deleted

## 2013-09-02 DIAGNOSIS — I2589 Other forms of chronic ischemic heart disease: Secondary | ICD-10-CM

## 2013-09-02 DIAGNOSIS — Z95810 Presence of automatic (implantable) cardiac defibrillator: Secondary | ICD-10-CM | POA: Diagnosis not present

## 2013-09-02 NOTE — Progress Notes (Signed)
EPIC Encounter for ICM Monitoring  Patient Name: Nathan Pruitt is a 67 y.o. male Date: 09/02/2013 Primary Care Physican: Maryella Shivers, MD Primary Cardiologist: Aundra Dubin Electrophysiologist: Allred Dry Weight: 205 lbs       In the past month, have you:  1. Gained more than 2 pounds in a day or more than 5 pounds in a week? Yes. The patient states that his weights have flucuated from 200-208 lbs.   2. Had changes in your medications (with verification of current medications)? Yes. The patient was on losartan, but intolerant to this due to muscle pain and nausea. He was on diovan previously, but this was a tier 4 on his insurance. He has recently received approval for this from his insurance and restarted on 08/20/13. This correlates with the time his optivol returned to baseline.   3. Had more shortness of breath than is usual for you? no  4. Limited your activity because of shortness of breath? no  5. Not been able to sleep because of shortness of breath? no  6. Had increased swelling in your feet or ankles? no  7. Had symptoms of dehydration (dizziness, dry mouth, increased thirst, decreased urine output) no  8. Had changes in sodium restriction? no  9. Been compliant with medication? Yes   ICM trend:   Follow-up plan: ICM clinic phone appointment: 10/04/13. I encouraged the patient to please call us for weights up 3 lbs or more in 24 hours or 5 lbs in a week.   Copy of note sent to patient's primary care physician, primary cardiologist, and device following physician.  Emily Filbert, RN, BSN 09/02/2013 11:52 AM

## 2013-09-03 ENCOUNTER — Other Ambulatory Visit: Payer: Self-pay

## 2013-09-07 DIAGNOSIS — L570 Actinic keratosis: Secondary | ICD-10-CM | POA: Diagnosis not present

## 2013-09-07 DIAGNOSIS — L821 Other seborrheic keratosis: Secondary | ICD-10-CM | POA: Diagnosis not present

## 2013-09-08 ENCOUNTER — Encounter: Payer: Self-pay | Admitting: Cardiology

## 2013-09-08 DIAGNOSIS — I472 Ventricular tachycardia: Secondary | ICD-10-CM | POA: Diagnosis not present

## 2013-09-08 DIAGNOSIS — I4729 Other ventricular tachycardia: Secondary | ICD-10-CM | POA: Diagnosis not present

## 2013-09-08 DIAGNOSIS — I2589 Other forms of chronic ischemic heart disease: Secondary | ICD-10-CM | POA: Diagnosis not present

## 2013-09-08 DIAGNOSIS — I2510 Atherosclerotic heart disease of native coronary artery without angina pectoris: Secondary | ICD-10-CM | POA: Diagnosis not present

## 2013-09-08 DIAGNOSIS — M069 Rheumatoid arthritis, unspecified: Secondary | ICD-10-CM | POA: Diagnosis not present

## 2013-09-30 ENCOUNTER — Other Ambulatory Visit: Payer: Self-pay | Admitting: Cardiology

## 2013-10-04 ENCOUNTER — Ambulatory Visit (INDEPENDENT_AMBULATORY_CARE_PROVIDER_SITE_OTHER): Payer: Medicare Other | Admitting: *Deleted

## 2013-10-04 ENCOUNTER — Encounter: Payer: Self-pay | Admitting: *Deleted

## 2013-10-04 DIAGNOSIS — I2589 Other forms of chronic ischemic heart disease: Secondary | ICD-10-CM

## 2013-10-04 DIAGNOSIS — Z95810 Presence of automatic (implantable) cardiac defibrillator: Secondary | ICD-10-CM | POA: Diagnosis not present

## 2013-10-04 NOTE — Progress Notes (Signed)
EPIC Encounter for ICM Monitoring  Patient Name: Nathan Pruitt is a 68 y.o. male Date: 10/04/2013 Primary Care Physican: Maryella Shivers, MD Primary Cardiologist: Aundra Dubin Electrophysiologist: Allred Dry Weight: 200 lbs       In the past month, have you:  1. Gained more than 2 pounds in a day or more than 5 pounds in a week? no  2. Had changes in your medications (with verification of current medications)? no  3. Had more shortness of breath than is usual for you? no  4. Limited your activity because of shortness of breath? no  5. Not been able to sleep because of shortness of breath? no  6. Had increased swelling in your feet or ankles? no  7. Had symptoms of dehydration (dizziness, dry mouth, increased thirst, decreased urine output) no  8. Had changes in sodium restriction? no  9. Been compliant with medication? Yes   ICM trend:   Follow-up plan: ICM clinic phone appointment: 11/04/13  Copy of note sent to patient's primary care physician, primary cardiologist, and device following physician.  Emily Filbert, RN, BSN 10/04/2013 10:59 AM

## 2013-10-07 ENCOUNTER — Other Ambulatory Visit: Payer: Self-pay | Admitting: Cardiology

## 2013-10-07 ENCOUNTER — Other Ambulatory Visit: Payer: Self-pay | Admitting: *Deleted

## 2013-10-07 ENCOUNTER — Telehealth: Payer: Self-pay | Admitting: Cardiology

## 2013-10-07 MED ORDER — VALSARTAN 80 MG PO TABS: 80.0000 mg | ORAL_TABLET | Freq: Two times a day (BID) | ORAL | Status: DC

## 2013-10-07 NOTE — Telephone Encounter (Signed)
Patient requests diovan generic rx to be sent in.

## 2013-10-07 NOTE — Telephone Encounter (Signed)
Patient notified of lab results from 07/28/13.  Patient states he never heard of the results. Patient verbalized understanding and agreement with current treatment plan. Discussed in detail the lab results and verified that the results are in [place]. Patient double-checked that he could access MyChart and that the results were indeed already in there. He found them and was able to navigate the system.

## 2013-10-07 NOTE — Telephone Encounter (Signed)
New message  ° ° °Patient calling for test results.   °

## 2013-10-13 ENCOUNTER — Other Ambulatory Visit: Payer: Self-pay | Admitting: Cardiology

## 2013-11-04 ENCOUNTER — Encounter: Payer: Self-pay | Admitting: *Deleted

## 2013-11-04 ENCOUNTER — Ambulatory Visit (INDEPENDENT_AMBULATORY_CARE_PROVIDER_SITE_OTHER): Payer: Medicare Other | Admitting: *Deleted

## 2013-11-04 DIAGNOSIS — Z95810 Presence of automatic (implantable) cardiac defibrillator: Secondary | ICD-10-CM

## 2013-11-04 DIAGNOSIS — I2589 Other forms of chronic ischemic heart disease: Secondary | ICD-10-CM | POA: Diagnosis not present

## 2013-11-04 DIAGNOSIS — I472 Ventricular tachycardia: Secondary | ICD-10-CM

## 2013-11-04 DIAGNOSIS — I4729 Other ventricular tachycardia: Secondary | ICD-10-CM

## 2013-11-04 LAB — MDC_IDC_ENUM_SESS_TYPE_REMOTE
Battery Voltage: 3.01 V
Brady Statistic AP VP Percent: 0.01 %
Brady Statistic AP VS Percent: 6.01 %
Brady Statistic AS VP Percent: 0.04 %
Brady Statistic AS VS Percent: 93.95 %
Brady Statistic RA Percent Paced: 6.02 %
Brady Statistic RV Percent Paced: 0.05 %
HIGH POWER IMPEDANCE MEASURED VALUE: 55 Ohm
HighPow Impedance: 44 Ohm
Lead Channel Impedance Value: 437 Ohm
Lead Channel Sensing Intrinsic Amplitude: 1.125 mV
Lead Channel Sensing Intrinsic Amplitude: 1.125 mV
Lead Channel Sensing Intrinsic Amplitude: 11.375 mV
Lead Channel Sensing Intrinsic Amplitude: 11.375 mV
Lead Channel Setting Pacing Amplitude: 2.5 V
Lead Channel Setting Pacing Pulse Width: 0.4 ms
Lead Channel Setting Sensing Sensitivity: 0.3 mV
MDC IDC MSMT LEADCHNL RA IMPEDANCE VALUE: 380 Ohm
MDC IDC SESS DTM: 20150702125219
MDC IDC SET LEADCHNL RA PACING AMPLITUDE: 2 V
MDC IDC SET ZONE DETECTION INTERVAL: 280 ms
MDC IDC SET ZONE DETECTION INTERVAL: 450 ms
Zone Setting Detection Interval: 350 ms
Zone Setting Detection Interval: 470 ms

## 2013-11-04 NOTE — Progress Notes (Signed)
EPIC Encounter for ICM Monitoring  Patient Name: Nathan Pruitt is a 68 y.o. male Date: 11/04/2013 Primary Care Physican: Maryella Shivers, MD Primary Cardiologist: Aundra Dubin Electrophysiologist: Allred Dry Weight: 205 lbs (patient previously stated 200 lbs as his baseline. Previous office visit show weights of 209 lbs in March)       In the past month, have you:  1. Gained more than 2 pounds in a day or more than 5 pounds in a week? Yes. The patient states his weight will flucuate between 205-208 lbs. He states he has cut out his soda intake and started drinking water within the last month.  2. Had changes in your medications (with verification of current medications)? no  3. Had more shortness of breath than is usual for you? no  4. Limited your activity because of shortness of breath? no  5. Not been able to sleep because of shortness of breath? no  6. Had increased swelling in your feet or ankles? no  7. Had symptoms of dehydration (dizziness, dry mouth, increased thirst, decreased urine output) no  8. Had changes in sodium restriction? no  9. Been compliant with medication? Yes   ICM trend:   Follow-up plan: ICM clinic phone appointment: 12/06/13. I reviewed the patient's optivol trends with Dr. Aundra Dubin. Orders received to have the patient increase his torsemide to 40 mg once daily x 3 days, then resume torsemide 20 mg once daily. I have notified the patient and he verbalizes understanding.   Copy of note sent to patient's primary care physician, primary cardiologist, and device following physician.  Alvis Lemmings, RN, BSN 11/04/2013 10:52 AM

## 2013-11-04 NOTE — Progress Notes (Signed)
Remote ICD transmission.   

## 2013-11-23 ENCOUNTER — Encounter: Payer: Self-pay | Admitting: Cardiology

## 2013-11-23 ENCOUNTER — Telehealth: Payer: Self-pay | Admitting: Cardiology

## 2013-11-23 NOTE — Telephone Encounter (Signed)
Pt send remote transmission b/c he was not feeling well, pt requested I call him with results. After consulting with device tech I called pt back and informed him that we received the transmission and that there were no episodes or alerts. Pt verbalized understanding.

## 2013-12-06 ENCOUNTER — Ambulatory Visit (INDEPENDENT_AMBULATORY_CARE_PROVIDER_SITE_OTHER): Payer: Medicare Other | Admitting: *Deleted

## 2013-12-06 DIAGNOSIS — I2589 Other forms of chronic ischemic heart disease: Secondary | ICD-10-CM | POA: Diagnosis not present

## 2013-12-06 DIAGNOSIS — Z95810 Presence of automatic (implantable) cardiac defibrillator: Secondary | ICD-10-CM | POA: Diagnosis not present

## 2013-12-09 ENCOUNTER — Encounter: Payer: Self-pay | Admitting: *Deleted

## 2013-12-09 ENCOUNTER — Encounter: Payer: Self-pay | Admitting: Internal Medicine

## 2013-12-09 NOTE — Progress Notes (Signed)
EPIC Encounter for ICM Monitoring  Patient Name: Nathan Pruitt is a 68 y.o. male Date: 12/09/2013 Primary Care Physican: Maryella Shivers, MD Primary Cardiologist: Aundra Dubin Electrophysiologist: Allred Dry Weight: 204 lbs       In the past month, have you:  1. Gained more than 2 pounds in a day or more than 5 pounds in a week? No. The patient states he has lost some weight. He averages about 200-204 lbs.  2. Had changes in your medications (with verification of current medications)? No. The patient's optivol readings were elevated with his transmission last month. He was given torsemide 40 mg x 3 days, then resumed his 20 mg daily dosing.  3. Had more shortness of breath than is usual for you? no  4. Limited your activity because of shortness of breath? no  5. Not been able to sleep because of shortness of breath? no  6. Had increased swelling in your feet or ankles? no  7. Had symptoms of dehydration (dizziness, dry mouth, increased thirst, decreased urine output) no  8. Had changes in sodium restriction? no  9. Been compliant with medication? Yes   ICM trend:   Follow-up plan: ICM clinic phone appointment: 01/13/14  Copy of note sent to patient's primary care physician, primary cardiologist, and device following physician.  Alvis Lemmings, RN, BSN 12/09/2013 5:00 PM

## 2013-12-15 ENCOUNTER — Encounter: Payer: Self-pay | Admitting: Cardiology

## 2013-12-16 ENCOUNTER — Other Ambulatory Visit: Payer: Self-pay | Admitting: Cardiology

## 2013-12-16 NOTE — Telephone Encounter (Signed)
Spoke with pt and he will bring info to appt on 01/21/14.

## 2013-12-27 ENCOUNTER — Other Ambulatory Visit: Payer: Self-pay | Admitting: Internal Medicine

## 2014-01-05 DIAGNOSIS — M069 Rheumatoid arthritis, unspecified: Secondary | ICD-10-CM | POA: Diagnosis not present

## 2014-01-11 DIAGNOSIS — M069 Rheumatoid arthritis, unspecified: Secondary | ICD-10-CM | POA: Diagnosis not present

## 2014-01-11 DIAGNOSIS — M545 Low back pain, unspecified: Secondary | ICD-10-CM | POA: Diagnosis not present

## 2014-01-11 DIAGNOSIS — M159 Polyosteoarthritis, unspecified: Secondary | ICD-10-CM | POA: Diagnosis not present

## 2014-01-13 ENCOUNTER — Ambulatory Visit (INDEPENDENT_AMBULATORY_CARE_PROVIDER_SITE_OTHER): Payer: Medicare Other | Admitting: *Deleted

## 2014-01-13 DIAGNOSIS — Z95810 Presence of automatic (implantable) cardiac defibrillator: Secondary | ICD-10-CM | POA: Diagnosis not present

## 2014-01-13 DIAGNOSIS — I2589 Other forms of chronic ischemic heart disease: Secondary | ICD-10-CM | POA: Diagnosis not present

## 2014-01-17 ENCOUNTER — Telehealth: Payer: Self-pay | Admitting: *Deleted

## 2014-01-17 ENCOUNTER — Encounter: Payer: Self-pay | Admitting: *Deleted

## 2014-01-17 NOTE — Telephone Encounter (Signed)
ICM transmission received. I left a message for the patient to call. 

## 2014-01-17 NOTE — Progress Notes (Signed)
EPIC Encounter for ICM Monitoring  Patient Name: Nathan Pruitt is a 68 y.o. male Date: 01/17/2014 Primary Care Physican: Maryella Shivers, MD Primary Cardiologist: Aundra Dubin Electrophysiologist: Allred Dry Weight: 202 lbs       In the past month, have you:  1. Gained more than 2 pounds in a day or more than 5 pounds in a week? no  2. Had changes in your medications (with verification of current medications)? no  3. Had more shortness of breath than is usual for you? no  4. Limited your activity because of shortness of breath? no  5. Not been able to sleep because of shortness of breath? no  6. Had increased swelling in your feet or ankles? no  7. Had symptoms of dehydration (dizziness, dry mouth, increased thirst, decreased urine output) no  8. Had changes in sodium restriction? Yes. The patient reports he did go on a "bacon binge" about 3 weeks ago.  9. Been compliant with medication? Yes   ICM trend:   Follow-up plan: ICM clinic phone appointment: 02/17/14. The patient has routine follow up with Dr. Aundra Dubin on 01/21/14.  Copy of note sent to patient's primary care physician, primary cardiologist, and device following physician.  Alvis Lemmings, RN, BSN 01/17/2014 12:30 PM

## 2014-01-17 NOTE — Telephone Encounter (Signed)
I spoke with the patient. 

## 2014-01-19 ENCOUNTER — Telehealth: Payer: Self-pay | Admitting: Cardiology

## 2014-01-19 ENCOUNTER — Other Ambulatory Visit: Payer: Self-pay

## 2014-01-19 MED ORDER — VALSARTAN 80 MG PO TABS: 80.0000 mg | ORAL_TABLET | Freq: Two times a day (BID) | ORAL | Status: DC

## 2014-01-19 NOTE — Telephone Encounter (Signed)
New problem:    Pt needs a script for Valsartan  80 mg 2 x 180 fax [phone] to [facility]

## 2014-01-20 ENCOUNTER — Encounter: Payer: Self-pay | Admitting: *Deleted

## 2014-01-21 ENCOUNTER — Ambulatory Visit (INDEPENDENT_AMBULATORY_CARE_PROVIDER_SITE_OTHER): Payer: Medicare Other | Admitting: Cardiology

## 2014-01-21 ENCOUNTER — Encounter: Payer: Self-pay | Admitting: Cardiology

## 2014-01-21 ENCOUNTER — Other Ambulatory Visit: Payer: Self-pay | Admitting: *Deleted

## 2014-01-21 ENCOUNTER — Telehealth: Payer: Self-pay | Admitting: Cardiology

## 2014-01-21 ENCOUNTER — Encounter: Payer: Self-pay | Admitting: *Deleted

## 2014-01-21 VITALS — BP 122/62 | HR 50 | Ht 71.0 in | Wt 208.0 lb

## 2014-01-21 DIAGNOSIS — I2510 Atherosclerotic heart disease of native coronary artery without angina pectoris: Secondary | ICD-10-CM

## 2014-01-21 DIAGNOSIS — E785 Hyperlipidemia, unspecified: Secondary | ICD-10-CM | POA: Diagnosis not present

## 2014-01-21 DIAGNOSIS — I4729 Other ventricular tachycardia: Secondary | ICD-10-CM

## 2014-01-21 DIAGNOSIS — I472 Ventricular tachycardia, unspecified: Secondary | ICD-10-CM

## 2014-01-21 DIAGNOSIS — I739 Peripheral vascular disease, unspecified: Secondary | ICD-10-CM

## 2014-01-21 DIAGNOSIS — I2589 Other forms of chronic ischemic heart disease: Secondary | ICD-10-CM | POA: Diagnosis not present

## 2014-01-21 LAB — BASIC METABOLIC PANEL
BUN: 19 mg/dL (ref 6–23)
CO2: 27 meq/L (ref 19–32)
Calcium: 8.8 mg/dL (ref 8.4–10.5)
Chloride: 101 mEq/L (ref 96–112)
Creatinine, Ser: 1.6 mg/dL — ABNORMAL HIGH (ref 0.4–1.5)
GFR: 45.77 mL/min — ABNORMAL LOW (ref >60.00)
Glucose, Bld: 116 mg/dL — ABNORMAL HIGH (ref 70–99)
POTASSIUM: 4.3 meq/L (ref 3.5–5.1)
Sodium: 136 mEq/L (ref 135–145)

## 2014-01-21 LAB — HEPATIC FUNCTION PANEL
ALBUMIN: 3.8 g/dL (ref 3.5–5.2)
ALK PHOS: 105 U/L (ref 39–117)
ALT: 18 U/L (ref 0–53)
AST: 23 U/L (ref 0–37)
Bilirubin, Direct: 0 mg/dL (ref 0.0–0.3)
Total Bilirubin: 0.5 mg/dL (ref 0.2–1.2)
Total Protein: 7.5 g/dL (ref 6.0–8.3)

## 2014-01-21 LAB — LIPID PANEL
Cholesterol: 178 mg/dL (ref 0–200)
HDL: 24.1 mg/dL — AB (ref >39.00)
LDL Cholesterol: 115 mg/dL — ABNORMAL HIGH (ref 0–99)
NONHDL: 153.9
Total CHOL/HDL Ratio: 7
Triglycerides: 194 mg/dL — ABNORMAL HIGH (ref 0.0–149.0)
VLDL: 38.8 mg/dL (ref 0.0–40.0)

## 2014-01-21 LAB — HEMOGLOBIN A1C: Hgb A1c MFr Bld: 6.2 % (ref 4.6–6.5)

## 2014-01-21 LAB — TSH: TSH: 3.33 u[IU]/mL (ref 0.35–4.50)

## 2014-01-21 MED ORDER — VALSARTAN 80 MG PO TABS: 80.0000 mg | ORAL_TABLET | Freq: Two times a day (BID) | ORAL | Status: DC

## 2014-01-21 NOTE — Telephone Encounter (Signed)
Pt states [employer] has not received prescription for valsartan.

## 2014-01-21 NOTE — Patient Instructions (Addendum)
Your physician recommends that you have lab  today--BMET/Lipid profile/Liver profile/TSH/Hgb A1c today  I have given you an order to have lab every 4 months--BMET/TSH/Liver profile. Please fax the results to Dr Aundra Dubin [phone].  Your physician wants you to follow-up in: 6 months with Dr McLean(March 2016).  Your physician has requested that you have an echocardiogram. Echocardiography is a painless test that uses sound waves to create images of your heart. It provides your doctor with information about the size and shape of your heart and how well your heart's chambers and valves are working. This procedure takes approximately one hour. There are no restrictions for this procedure. IN 6 MONTHS WHEN YOU SEE DR [facility]

## 2014-01-21 NOTE — Telephone Encounter (Signed)
New message      Pt was seen today---have a question to ask Webb Silversmith.  Please call

## 2014-01-21 NOTE — Telephone Encounter (Signed)
Order given to Bradd Burner at [facility] [phone] for valsartan 80mg  #180 bid. Pt advised.

## 2014-01-21 NOTE — Progress Notes (Signed)
Patient ID: Nathan Pruitt, male   DOB: 12/20/1945, 68 y.o.   MRN: [id number] PCP: Dr. Maryella Shivers  68 yo with history of CAD, ischemic CMP, and VT s/p ICD placement returns for followup.  He is on amiodarone.  Last echo in 7/12 showed EF 45-50%, mild LVH, akinesis of the basal to mid inferoposterior wall.  Symptomatically, he has been stable.  He is not very active.  No chest pain.   He is still short of breath with heavier activity such as shoveling gravel.  No dyspnea walking on flat ground or in stores.  He is now on pravastatin 40 mg daily (unable to tolerate 80 mg daily due to myalgias).  No lightheadedness.   Labs (11/10): K 4.9, creatinine 1.4, LDL 70, HDL 31, LFTs normal Labs (3/11): K 4.4, creatinine 1.3, LDL 61, HDL 35 Labs (7/11): LDL 62, HDL 23 Labs (1/12): K 4.8, LFTs normal, creatinine 1.44, LDL 69, HDL 33 Labs (7/12): LFTs normal, LDL 81, HDL 36, HCT normal, K 4.3, creatinine 1.3, BNP 116, TSH normal Labs (1/13): LDL 39, HDl 31, LFTs normal, TSH  Labs (7/13): TSH normal, LFTs normal, LDL 69, HDL 40 Labs (8/13): K 4.5, creatinine 1.6 Labs (10/13): K 4.4, creatinine 1.4 Labs (3/14): LDL 108, HDL 30, LFTs normal Labs (9/14): K 4.2, creatinine 1.6, LFTs normal, LDL 114, TSH 10.6 (elevated), free T4/free T3 normal Labs (2/15): K 4.6, creatinine 1.5, LFTs normal, TSH 7.6 (elevated), free T4 and free T3 normal Labs (3/15): K 3.6, creatinine 1.5, LFTs normal, TSH normal  ECG: a-paced, IVCD with QRS 134 msec  Allergies (verified):  No Known Drug Allergies  Past Medical History: 1. Coronary artery disease.       a.     The patient reports history of silent MI in 1993.  This was likely an inferior MI (see below).       b.     The patient reports history of 2-D echocardiogram in March        2009, showing an EF of 40%.       c.     The patient reports history of Myoview in [facility] in March 2009, which        was per his report normal.       d.     The patient presented to  [facility] in 5/10 with VT and mildly elevated cardiac enzymes.         LHC (5/10):  Inferobasal dyskinesis with EF 35-40%.  There was chronic total occlusion of the         mid RCA with good collaterals.  Luminals LCA.  This did not appear to be an acute cause of the             5/10 event.  2. Hypertension.  3. Hyperlipidemia.  4. Remote tobacco abuse with 47-pack-year history, quitting in August 2009.  5. Peripheral arterial disease.       a.     Status post left-to-right fem-fem bypass performed in        [place] in March 2009.       b.     Status post redo left-to-right fem-fem bypass performed by        Dr. Scot Dock at [facility] in 2009.       c.     ABIs normal 3/15.  6. Rheumatoid arthritis, on leflunomide.  7.  Ischemic cardiomyopathy:  EF 35-40% by LV-gram 5/10 with inferobasal dyskinesis.  Echo 5/10 showed EF 40% with mild LVH, no significant MR, inferobasal and posterobasal akinesis.  Echo (7/11): EF 50%, mild LVH, basal-mid inferoposterior akinesis.  Echo (7/12): EF 45-50% with basal anterolateral, basal posterior, and basal to mid inferior akinesis.   8.  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  He has a dual chamber Medtronic ICD.  9.  Atrial flutter:  Status post isthmus ablation 5/10.   10.  PFTs (7/11): FVC 74%, FEV1 80%, ratio 75%, TLC 78%, DLCO 68%.  This suggests a mild restrictive defect and a mild obstructive defect.  He did have response to bronchodilator. These PFTs were significantly better than the report from Dr. Alcide Clever in [place] done prior.  He had last PFTs in 2/12 with no significant change.  11.  Anxiety 12.  Chronic cough: No relief with change from ACEI to ARB or with trial of PPI.   Family History: Mother died in her late 83s with gastric cancer.  Father died in his late 29s with throat cancer and PVD.   He had a brother who died at 82 of an MI.   Social History: Retired--telephone company.   Originally from [place].  Single  Tobacco Use - Former. -47ppy hx, quit 2009.  Alcohol Use - yes-minimal Regular Exercise - yes Drug Use - no (prior)  Review of Systems        All systems reviewed and negative except as per HPI.   Current Outpatient Prescriptions  Medication Sig Dispense Refill  . amiodarone (PACERONE) 200 MG tablet TAKE 1 TABLET EVERY DAY  90 tablet  3  . aspirin EC 81 MG tablet Take 1 tablet (81 mg total) by mouth daily.      . carvedilol (COREG) 12.5 MG tablet TAKE 1 TABLET BY MOUTH TWICE DAILY WITH A MEAL  180 tablet  1  . cyclobenzaprine (FLEXERIL) 5 MG tablet Take 5 mg by mouth 3 (three) times daily as needed for muscle spasms.      [name] leflunomide (ARAVA) 20 MG tablet Take 20 mg by mouth daily.        . pravastatin (PRAVACHOL) 40 MG tablet Take 1 tablet (40 mg total) by mouth every evening.  90 tablet  3  . torsemide (DEMADEX) 20 MG tablet TAKE 1 TABLET BY MOUTH EVERY DAY  90 tablet  0  . valsartan (DIOVAN) 80 MG tablet Take 1 tablet (80 mg total) by mouth 2 (two) times daily.  180 tablet  0   No current facility-administered medications for this visit.    BP 122/62  Pulse 50  Ht 5\' 11"  (1.803 m)  Wt 208 lb (94.348 kg)  BMI 29.02 kg/m2 General:  Well developed, well nourished, in no acute distress. Neck:  Neck supple, no JVD. No masses, thyromegaly or abnormal cervical nodes. Lungs:  Slightly decreased breath sounds bilaterally.  Heart:  Non-displaced PMI, chest non-tender; regular rate and rhythm, S1, S2 without murmurs, rubs or gallops. Carotid upstroke normal, no bruit. Normal abdominal aortic size, no bruits. Femorals normal pulses, no bruits. Pedals normal pulses. No edema, no varicosities. Abdomen:  Bowel sounds positive; abdomen soft and non-tender without masses, organomegaly, or hernias noted. No hepatosplenomegaly. Extremities:  No clubbing or cyanosis. Neurologic:  Alert and oriented x 3. Psych:  Normal  affect.  Assessment/Plan:  CARDIOMYOPATHY, ISCHEMIC Patient does not appear volume overloaded on exam and has stable NYHA class II-III dyspnea.  Last EF by echo was 45-50%.  Stable  CKD.  - Continue current torsemide, valsartan, and Coreg. - I will arrange for echo to reassess EF when he returns for his next appointment with me. - BMET today.  CAD No chest pain. Continue ASA, statin, Coreg, ARB.  HYPERLIPIDEMIA  Myalgias with Lipitor and Crestor.  Livalo tolerated but too expensive.  Now on pravastatin 40 mg daily. He had myalgias with 80 mg pravastatin.  Check lipids today, consider PCSK-9 inhibitor if LDL very high (versus Zetia). VENTRICULAR TACHYCARDIA  Continue amiodarone and Coreg. Also has ICD.  Repeat LFTs/TSH today. He knows to have a yearly eye exam.  PAD He denies claudication.  Normal ABIs in 3/15, followed by [facility].    Loralie Champagne 01/21/2014

## 2014-01-24 ENCOUNTER — Other Ambulatory Visit: Payer: Self-pay | Admitting: *Deleted

## 2014-01-24 NOTE — Progress Notes (Signed)
Notes Recorded by Larey Dresser, MD on 01/24/2014  LDL too high. Could he try pravastatin 80 mg daily? If myalgias too bad with that before, would add Zetia 10 mg daily. Lipids/LFTs in 2 months.

## 2014-02-17 ENCOUNTER — Encounter: Payer: Self-pay | Admitting: Internal Medicine

## 2014-02-17 ENCOUNTER — Ambulatory Visit (INDEPENDENT_AMBULATORY_CARE_PROVIDER_SITE_OTHER): Payer: Medicare Other | Admitting: *Deleted

## 2014-02-17 DIAGNOSIS — Z95810 Presence of automatic (implantable) cardiac defibrillator: Secondary | ICD-10-CM

## 2014-02-17 DIAGNOSIS — I472 Ventricular tachycardia: Secondary | ICD-10-CM | POA: Diagnosis not present

## 2014-02-17 DIAGNOSIS — I255 Ischemic cardiomyopathy: Secondary | ICD-10-CM | POA: Diagnosis not present

## 2014-02-17 DIAGNOSIS — I4729 Other ventricular tachycardia: Secondary | ICD-10-CM

## 2014-02-18 ENCOUNTER — Encounter: Payer: Self-pay | Admitting: *Deleted

## 2014-02-18 NOTE — Progress Notes (Signed)
Remote ICD transmission.   

## 2014-02-18 NOTE — Addendum Note (Signed)
Addended by: Alvis Lemmings C on: 02/18/2014 04:48 PM   Modules accepted: Level of Service

## 2014-02-18 NOTE — Progress Notes (Signed)
EPIC Encounter for ICM Monitoring  Patient Name: Nathan Pruitt is a 68 y.o. male Date: 02/18/2014 Primary Care Physican: Maryella Shivers, MD Primary Cardiologist: Aundra Dubin Electrophysiologist: Allred Dry Weight: 202 lbs       In the past month, have you:  1. Gained more than 2 pounds in a day or more than 5 pounds in a week? no  2. Had changes in your medications (with verification of current medications)? no  3. Had more shortness of breath than is usual for you? no  4. Limited your activity because of shortness of breath? no  5. Not been able to sleep because of shortness of breath? no  6. Had increased swelling in your feet or ankles? no  7. Had symptoms of dehydration (dizziness, dry mouth, increased thirst, decreased urine output) no  8. Had changes in sodium restriction? no  9. Been compliant with medication? Yes   ICM trend:   Follow-up plan: ICM clinic phone appointment: 03/21/14  Copy of note sent to patient's primary care physician, primary cardiologist, and device following physician.  Alvis Lemmings, RN, BSN 02/18/2014 4:43 PM

## 2014-02-28 DIAGNOSIS — R351 Nocturia: Secondary | ICD-10-CM | POA: Diagnosis not present

## 2014-02-28 DIAGNOSIS — N401 Enlarged prostate with lower urinary tract symptoms: Secondary | ICD-10-CM | POA: Diagnosis not present

## 2014-02-28 DIAGNOSIS — N3090 Cystitis, unspecified without hematuria: Secondary | ICD-10-CM | POA: Diagnosis not present

## 2014-02-28 DIAGNOSIS — R972 Elevated prostate specific antigen [PSA]: Secondary | ICD-10-CM | POA: Diagnosis not present

## 2014-03-01 LAB — MDC_IDC_ENUM_SESS_TYPE_REMOTE
Battery Voltage: 3.01 V
Brady Statistic AP VP Percent: 0 %
Brady Statistic AS VS Percent: 99.92 %
Date Time Interrogation Session: 20151015113828
HIGH POWER IMPEDANCE MEASURED VALUE: 43 Ohm
HighPow Impedance: 53 Ohm
Lead Channel Impedance Value: 437 Ohm
Lead Channel Sensing Intrinsic Amplitude: 12.75 mV
Lead Channel Setting Pacing Amplitude: 2 V
Lead Channel Setting Pacing Amplitude: 2.5 V
Lead Channel Setting Pacing Pulse Width: 0.4 ms
Lead Channel Setting Sensing Sensitivity: 0.3 mV
MDC IDC MSMT LEADCHNL RA IMPEDANCE VALUE: 380 Ohm
MDC IDC MSMT LEADCHNL RA SENSING INTR AMPL: 2.375 mV
MDC IDC SET ZONE DETECTION INTERVAL: 280 ms
MDC IDC STAT BRADY AP VS PERCENT: 0.08 %
MDC IDC STAT BRADY AS VP PERCENT: 0 %
MDC IDC STAT BRADY RA PERCENT PACED: 0.08 %
MDC IDC STAT BRADY RV PERCENT PACED: 0 %
Zone Setting Detection Interval: 350 ms
Zone Setting Detection Interval: 450 ms
Zone Setting Detection Interval: 470 ms

## 2014-03-09 ENCOUNTER — Encounter: Payer: Self-pay | Admitting: Cardiology

## 2014-03-10 DIAGNOSIS — L570 Actinic keratosis: Secondary | ICD-10-CM | POA: Diagnosis not present

## 2014-03-10 DIAGNOSIS — C44529 Squamous cell carcinoma of skin of other part of trunk: Secondary | ICD-10-CM | POA: Diagnosis not present

## 2014-03-21 ENCOUNTER — Other Ambulatory Visit: Payer: Self-pay

## 2014-03-21 ENCOUNTER — Telehealth: Payer: Self-pay | Admitting: Cardiology

## 2014-03-21 ENCOUNTER — Encounter: Payer: Medicare Other | Admitting: *Deleted

## 2014-03-21 ENCOUNTER — Telehealth: Payer: Self-pay | Admitting: Internal Medicine

## 2014-03-21 ENCOUNTER — Encounter: Payer: Self-pay | Admitting: Internal Medicine

## 2014-03-21 ENCOUNTER — Encounter: Payer: Self-pay | Admitting: Cardiology

## 2014-03-21 ENCOUNTER — Other Ambulatory Visit: Payer: Self-pay | Admitting: Cardiology

## 2014-03-21 MED ORDER — PRAVASTATIN SODIUM 80 MG PO TABS: 80.0000 mg | ORAL_TABLET | Freq: Every day | ORAL | Status: DC

## 2014-03-21 MED ORDER — TORSEMIDE 20 MG PO TABS: 20.0000 mg | ORAL_TABLET | Freq: Every day | ORAL | Status: DC

## 2014-03-21 NOTE — Telephone Encounter (Signed)
Refills sent to [facility] in [place], as requested.

## 2014-03-21 NOTE — Telephone Encounter (Signed)
New problem    Pt need new prescription for Pravastatin 80mg  for 90days and Torsemide 20mg  for 90days. Pt would like a call before prescriptions are written. Please call pt.

## 2014-03-21 NOTE — Telephone Encounter (Signed)
New problem    Pt is having problems sending his remote device check. Please call pt.

## 2014-03-21 NOTE — Telephone Encounter (Signed)
Spoke with pt and reminded pt of remote transmission that is due today. Pt verbalized understanding.   

## 2014-03-21 NOTE — Telephone Encounter (Signed)
Pt's [employer] attempts to dial but will not complete a transmission. Pt sees yellow lights on the sending portion of his box that do not turn green nor progress to the "target" icon. Pt remains by his monitor awaiting a confirmation sound that a fax is ready to receive. I let pt know there is not a confirmation sound to wait for. We verified his box is configured appropriately. Pt will call tech svcs to further trouble shoot. Pt does not have a cell phone to call tech svcs for extended trouble shooting. Pt understands not all diagnostics can be addressed while talking to tech [employer] on his landline.

## 2014-03-22 ENCOUNTER — Encounter: Payer: Self-pay | Admitting: Internal Medicine

## 2014-04-05 DIAGNOSIS — D045 Carcinoma in situ of skin of trunk: Secondary | ICD-10-CM | POA: Diagnosis not present

## 2014-04-06 ENCOUNTER — Telehealth: Payer: Self-pay | Admitting: Internal Medicine

## 2014-04-06 NOTE — Telephone Encounter (Signed)
New Message  Pt called reports his medtronic device died. Having a replacement shipped in and it arrives in 2-3 weeks. Unable to complete a remote check. This is in response to his My Chart reminder.  Please call back to discuss if needed

## 2014-04-12 ENCOUNTER — Encounter: Payer: Self-pay | Admitting: Cardiology

## 2014-04-12 ENCOUNTER — Encounter: Payer: Self-pay | Admitting: Internal Medicine

## 2014-04-13 ENCOUNTER — Telehealth: Payer: Self-pay | Admitting: Cardiology

## 2014-04-13 NOTE — Telephone Encounter (Signed)
Informed pt that I would have Heather call him about next ICM check. Pt verbalized understanding.

## 2014-04-15 ENCOUNTER — Ambulatory Visit (INDEPENDENT_AMBULATORY_CARE_PROVIDER_SITE_OTHER): Payer: Medicare Other | Admitting: *Deleted

## 2014-04-15 DIAGNOSIS — Z95810 Presence of automatic (implantable) cardiac defibrillator: Secondary | ICD-10-CM | POA: Diagnosis not present

## 2014-04-15 DIAGNOSIS — I255 Ischemic cardiomyopathy: Secondary | ICD-10-CM

## 2014-04-15 NOTE — Progress Notes (Signed)
EPIC Encounter for ICM Monitoring  Patient Name: Nathan Pruitt is a 68 y.o. male Date: 04/15/2014 Primary Care Physican: Maryella Shivers, MD Primary Cardiologist: Aundra Dubin Electrophysiologist: Allred Dry Weight: 202 lbs       In the past month, have you:  1. Gained more than 2 pounds in a day or more than 5 pounds in a week? no  2. Had changes in your medications (with verification of current medications)? no  3. Had more shortness of breath than is usual for you? no  4. Limited your activity because of shortness of breath? no  5. Not been able to sleep because of shortness of breath? no  6. Had increased swelling in your feet or ankles? no  7. Had symptoms of dehydration (dizziness, dry mouth, increased thirst, decreased urine output) no  8. Had changes in sodium restriction? Possibly over the Thanksgiving holiday.  9. Been compliant with medication? Yes   ICM trend:   Follow-up plan: ICM clinic phone appointment: 05/24/14. The patient optivol readings were slightly elevated from ~ 11/19-12/1. He does not recall any change in symptoms or weight during that time. Readings are currently back to baseline. I will follow up with him in 1 month.  Copy of note sent to patient's primary care physician, primary cardiologist, and device following physician.  Alvis Lemmings, RN, BSN 04/15/2014 4:13 PM

## 2014-04-19 ENCOUNTER — Encounter: Payer: Self-pay | Admitting: Internal Medicine

## 2014-04-21 NOTE — Telephone Encounter (Signed)
Spoke w/ pt. He has discussed remote appointments w/ Heather.

## 2014-04-22 DIAGNOSIS — Z23 Encounter for immunization: Secondary | ICD-10-CM | POA: Diagnosis not present

## 2014-05-16 ENCOUNTER — Other Ambulatory Visit: Payer: Self-pay | Admitting: *Deleted

## 2014-05-16 ENCOUNTER — Encounter: Payer: Self-pay | Admitting: Internal Medicine

## 2014-05-16 ENCOUNTER — Encounter: Payer: Self-pay | Admitting: Cardiology

## 2014-05-16 ENCOUNTER — Other Ambulatory Visit: Payer: Self-pay | Admitting: Cardiology

## 2014-05-16 DIAGNOSIS — I2510 Atherosclerotic heart disease of native coronary artery without angina pectoris: Secondary | ICD-10-CM

## 2014-05-16 MED ORDER — VALSARTAN 80 MG PO TABS: 80.0000 mg | ORAL_TABLET | Freq: Two times a day (BID) | ORAL | Status: DC

## 2014-05-16 MED ORDER — CARVEDILOL 12.5 MG PO TABS: ORAL_TABLET | ORAL | Status: DC

## 2014-05-24 ENCOUNTER — Encounter: Payer: Self-pay | Admitting: Internal Medicine

## 2014-05-24 ENCOUNTER — Telehealth: Payer: Self-pay | Admitting: Cardiology

## 2014-05-24 ENCOUNTER — Ambulatory Visit (INDEPENDENT_AMBULATORY_CARE_PROVIDER_SITE_OTHER): Payer: Medicare Other | Admitting: *Deleted

## 2014-05-24 ENCOUNTER — Telehealth: Payer: Self-pay | Admitting: *Deleted

## 2014-05-24 DIAGNOSIS — I472 Ventricular tachycardia: Secondary | ICD-10-CM

## 2014-05-24 DIAGNOSIS — Z95810 Presence of automatic (implantable) cardiac defibrillator: Secondary | ICD-10-CM | POA: Diagnosis not present

## 2014-05-24 DIAGNOSIS — I255 Ischemic cardiomyopathy: Secondary | ICD-10-CM

## 2014-05-24 DIAGNOSIS — I4729 Other ventricular tachycardia: Secondary | ICD-10-CM

## 2014-05-24 NOTE — Telephone Encounter (Signed)
Pt concerned his remote monitor is not automatically searching for him after 4:15am. Pt received a new monitor early Dec. It has been paired successfully on 04/12/14.   Note put in pt's appt on 07/27/14 to change remote transmission time if changeable.   ROV w/ Dr. Rayann Heman 07/27/14.

## 2014-05-24 NOTE — Telephone Encounter (Signed)
Spoke with pt and reminded pt of remote transmission that is due today. Pt verbalized understanding.   

## 2014-05-24 NOTE — Progress Notes (Signed)
Remote ICD transmission.   

## 2014-05-25 LAB — MDC_IDC_ENUM_SESS_TYPE_REMOTE
Battery Voltage: 2.97 V
Brady Statistic AP VS Percent: 1.15 %
Brady Statistic RA Percent Paced: 1.15 %
Date Time Interrogation Session: 20160119185750
HighPow Impedance: 47 Ohm
HighPow Impedance: 63 Ohm
Lead Channel Impedance Value: 437 Ohm
Lead Channel Impedance Value: 475 Ohm
Lead Channel Sensing Intrinsic Amplitude: 1.75 mV
Lead Channel Sensing Intrinsic Amplitude: 11.25 mV
Lead Channel Setting Pacing Amplitude: 2 V
Lead Channel Setting Pacing Amplitude: 2.5 V
Lead Channel Setting Sensing Sensitivity: 0.3 mV
MDC IDC MSMT LEADCHNL RA SENSING INTR AMPL: 1.75 mV
MDC IDC MSMT LEADCHNL RV SENSING INTR AMPL: 11.25 mV
MDC IDC SET LEADCHNL RV PACING PULSEWIDTH: 0.4 ms
MDC IDC STAT BRADY AP VP PERCENT: 0 %
MDC IDC STAT BRADY AS VP PERCENT: 0.01 %
MDC IDC STAT BRADY AS VS PERCENT: 98.84 %
MDC IDC STAT BRADY RV PERCENT PACED: 0.02 %
Zone Setting Detection Interval: 280 ms
Zone Setting Detection Interval: 350 ms
Zone Setting Detection Interval: 450 ms
Zone Setting Detection Interval: 470 ms

## 2014-05-25 NOTE — Addendum Note (Signed)
Addended by: Alvis Lemmings C on: 05/25/2014 05:42 PM   Modules accepted: Level of Service

## 2014-05-25 NOTE — Progress Notes (Signed)
EPIC Encounter for ICM Monitoring  Patient Name: Nathan Pruitt is a 69 y.o. male Date: 05/25/2014 Primary Care Physican: Maryella Shivers, MD Primary Cardiologist: Aundra Dubin Electrophysiologist: Allred Dry Weight: 202 lbs       In the past month, have you:  1. Gained more than 2 pounds in a day or more than 5 pounds in a week? no  2. Had changes in your medications (with verification of current medications)? no  3. Had more shortness of breath than is usual for you? no  4. Limited your activity because of shortness of breath? no  5. Not been able to sleep because of shortness of breath? no  6. Had increased swelling in your feet or ankles? no  7. Had symptoms of dehydration (dizziness, dry mouth, increased thirst, decreased urine output) no  8. Had changes in sodium restriction? no  9. Been compliant with medication? Yes   ICM trend:   Follow-up plan: ICM clinic phone appointment: Pending- the patient is quite frustrated that his transmissions are not downloading automatically and that he is having to send manuel transmissions every month. He has follow up scheduled with Dr. Rayann Heman on 07/27/14. He will discuss whether or not he wishes to continue ICM transmissions at that time with Dr. Rayann Heman. I will skip February as he has looked very good this month.   Copy of note sent to patient's primary care physician, primary cardiologist, and device following physician.  Alvis Lemmings, RN, BSN 05/25/2014 5:38 PM

## 2014-05-31 ENCOUNTER — Encounter: Payer: Self-pay | Admitting: Cardiology

## 2014-06-09 ENCOUNTER — Encounter: Payer: Self-pay | Admitting: Internal Medicine

## 2014-06-29 DIAGNOSIS — E785 Hyperlipidemia, unspecified: Secondary | ICD-10-CM | POA: Diagnosis not present

## 2014-06-29 DIAGNOSIS — Z79899 Other long term (current) drug therapy: Secondary | ICD-10-CM | POA: Diagnosis not present

## 2014-06-29 DIAGNOSIS — I255 Ischemic cardiomyopathy: Secondary | ICD-10-CM | POA: Diagnosis not present

## 2014-06-29 DIAGNOSIS — M069 Rheumatoid arthritis, unspecified: Secondary | ICD-10-CM | POA: Diagnosis not present

## 2014-06-29 DIAGNOSIS — I2510 Atherosclerotic heart disease of native coronary artery without angina pectoris: Secondary | ICD-10-CM | POA: Diagnosis not present

## 2014-06-29 DIAGNOSIS — I472 Ventricular tachycardia: Secondary | ICD-10-CM | POA: Diagnosis not present

## 2014-07-06 ENCOUNTER — Encounter: Payer: Self-pay | Admitting: Family

## 2014-07-07 ENCOUNTER — Ambulatory Visit (INDEPENDENT_AMBULATORY_CARE_PROVIDER_SITE_OTHER): Payer: Medicare Other | Admitting: Family

## 2014-07-07 ENCOUNTER — Ambulatory Visit (HOSPITAL_COMMUNITY)
Admission: RE | Admit: 2014-07-07 | Discharge: 2014-07-07 | Disposition: A | Discharge status: Home / Self Care | Payer: Medicare Other | Source: Ambulatory Visit | Attending: Family | Admitting: Family

## 2014-07-07 ENCOUNTER — Encounter: Payer: Self-pay | Admitting: Family

## 2014-07-07 VITALS — BP 114/72 | HR 53 | Resp 16 | Ht 70.5 in | Wt 207.0 lb

## 2014-07-07 DIAGNOSIS — M79671 Pain in right foot: Secondary | ICD-10-CM | POA: Insufficient documentation

## 2014-07-07 DIAGNOSIS — I739 Peripheral vascular disease, unspecified: Secondary | ICD-10-CM | POA: Diagnosis not present

## 2014-07-07 DIAGNOSIS — Z95828 Presence of other vascular implants and grafts: Secondary | ICD-10-CM

## 2014-07-07 DIAGNOSIS — Z9889 Other specified postprocedural states: Secondary | ICD-10-CM | POA: Insufficient documentation

## 2014-07-07 DIAGNOSIS — Z87891 Personal history of nicotine dependence: Secondary | ICD-10-CM | POA: Insufficient documentation

## 2014-07-07 DIAGNOSIS — I255 Ischemic cardiomyopathy: Secondary | ICD-10-CM

## 2014-07-07 NOTE — Addendum Note (Signed)
Addended by: Mena Goes on: 07/07/2014 02:40 PM   Modules accepted: Orders

## 2014-07-07 NOTE — Progress Notes (Signed)
[facility] [place] HISTORY AND PHYSICAL -PAD  History of Present Illness Nathan Pruitt is a 69 y.o. male who is s/p redo left to right fem-fem graft 12/30/07 by Dr. Scot Dock. He returns today for routine surveillance. He denies claudication symptoms, denies non healing wounds. He has right hip pain with walking.  Has a defibrillator in place, he denies history of stroke or TIA.  The patient denies New Medical or Surgical History  Pt Diabetic: No Pt smoker: former smoker, quit in 2010  Pt meds include: Statin :Yes ASA: Yes Other anticoagulants/antiplatelets: no    Past Medical History  Diagnosis Date  . CAD (coronary artery disease)     hx of silent MI in 1993. likely an inferior MI. hx of 2D cardiogram in 3/09 showing EF of 40%. hx of myoview in [place] 3/09-nml. presented to [facility] 5/10 with VT and mildly elevated cardiac enzymes LHC (5/10): inferobasal dyskinesis with EF 35-40%. was chronic total occlusion of mid RCA with good collaterals. luminals LCA. this does not appear to be an acute cause of the 5/10 event  . HTN (hypertension)   . HLD (hyperlipidemia)   . Tobacco abuse     47 pack year hx; quit 8/09  . PAD (peripheral artery disease)     s/p L-to-R fem-fem bypass performed by Dr. Scot Dock at [facility] in 2009   . Rheumatoid arthritis(714.0)     on leflunomide  . Ischemic cardiomyopathy     EF 35-40% by LV-gram 5/10 with inferobasal dyskinesis. echo 5/10 showed EF 40% w/mild LVH, no sig. MR, inferobasal and posterobasal akinesis. echo (7/11): EF 50%, mild LVH, basal-mid inferoposterior akenesis  . 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 He has duel chamber Medtronic ICD  . Atrial flutter     s/p isthmus ablation 5/10  . Abnormal PFTs 7/11    FVC 74%, FEV1 80%, ratio 75%, TLC 78%, DLCO 68%. this suggests a mild restrictive and obstructive defect. pt did have a response to bronchodilator. these  PFTs were significantly better than the report from Dr. Alcide Clever in [place] done prior.   [name] Anxiety   . Silent myocardial infarction 1993  . CHF (congestive heart failure)     Social History History  Substance Use Topics  . Smoking status: Former Smoker -- 72 years  . Smokeless tobacco: Never Used     Comment: QUIT 2009  . Alcohol Use: Yes     Comment: minimal     Family History Family History  Problem Relation Age of Onset  . Gastric cancer Mother   . Throat cancer Father   . Peripheral vascular disease Father   . Heart attack Brother 31    Past Surgical History  Procedure Laterality Date  . Vasectomy    . Tonsillectomy    . Femoral artery - femoral artery bypass graft  march 2009    left to right bypass, first @ [facility], second at [facility] by Dr Scot Dock  . Ablation    . Cardiac defibrillator placement    . Ep implantable device  09/22/08    Medtronic, ICD Model Number:  D274DRG, ICD Serial Number: [id number]    Allergies  Allergen Reactions  . Atorvastatin Other (See Comments)    Muscle pain  . Crestor [Rosuvastatin Calcium] Other (See Comments)    Muscle pain  . Losartan Other (See Comments)    Muscle pain   . Telmisartan Other (  See Comments)    Stomach ache    Current Outpatient Prescriptions  Medication Sig Dispense Refill  . amiodarone (PACERONE) 200 MG tablet TAKE 1 TABLET BY MOUTH EVERY DAY 90 tablet 1  . aspirin EC 81 MG tablet Take 1 tablet (81 mg total) by mouth daily.    . carvedilol (COREG) 12.5 MG tablet TAKE 1 TABLET BY MOUTH TWICE DAILY WITH A MEAL 180 tablet 0  . cyclobenzaprine (FLEXERIL) 5 MG tablet Take 5 mg by mouth 3 (three) times daily as needed for muscle spasms.    [name] leflunomide (ARAVA) 20 MG tablet Take 20 mg by mouth daily.      . pravastatin (PRAVACHOL) 80 MG tablet Take 1 tablet (80 mg total) by mouth daily. Pt will take 40mg  -2 tablets daily 90 tablet 3  . torsemide (DEMADEX) 20 MG tablet Take 1 tablet (20 mg  total) by mouth daily. 90 tablet 3  . valsartan (DIOVAN) 80 MG tablet Take 1 tablet (80 mg total) by mouth 2 (two) times daily. 180 tablet 0   No current facility-administered medications for this visit.    ROS: See HPI for pertinent positives and negatives.   Physical Examination  Filed Vitals:   07/07/14 0956  BP: 114/72  Pulse: 53  Resp: 16  Height: 5' 10.5" (1.791 m)  Weight: 207 lb (93.895 kg)  SpO2: 96%   Body mass index is 29.27 kg/(m^2).   General: A&O x 3, WDWN, . Gait: normal Eyes: PERRLA. Pulmonary: CTAB, without wheezes , rales or rhonchi. Cardiac: regular Rythm , without detected murmur. Defibrillator palpated left side of chest.     Carotid Bruits Left Right   Negative Negative  Aorta is not palpable. Radial pulses: 2+ palpable and =   VASCULAR EXAM: Extremities without ischemic changes  without Gangrene; without open wounds.     LE Pulses LEFT RIGHT   FEMORAL  palpable  palpable    POPLITEAL not palpable  not palpable   POSTERIOR TIBIAL  palpable   palpable    DORSALIS PEDIS  ANTERIOR TIBIAL not palpable  palpable    Abdomen: soft, NT, no palpable masses. Skin: no rashes, no ulcers. Musculoskeletal: no muscle wasting or atrophy. Neurologic: A&O X 3; Appropriate Affect ; SENSATION: normal; MOTOR FUNCTION: moving all extremities equally, motor strength 5/5 throughout. Speech is fluent/normal. CN 2-12 intact.          Non-Invasive Vascular Imaging: DATE: 07/07/2014 ABI: RIGHT 1.08 (07/27/13, 1.09), Waveforms: triphasic;  LEFT 1.15 (0.99), Waveforms: triphasic   ASSESSMENT: Nathan Pruitt is a 69 y.o. male who is s/p redo left to right fem-fem graft 12/30/07. He has no claudication symptoms, no tissue loss. ABI's  and TBI's remain normal with all triphasic waveforms.   PLAN:  I discussed in depth with the patient the nature of atherosclerosis, and emphasized the importance of maximal medical management including strict control of blood pressure, blood glucose, and lipid levels, obtaining regular exercise, and continued cessation of smoking.  The patient is aware that without maximal medical management the underlying atherosclerotic disease process will progress, limiting the benefit of any interventions.  Based on the patient's vascular studies and examination, pt will return to clinic in 1 year for ABI's.   The patient was given information about PAD including signs, symptoms, treatment, what symptoms should prompt the patient to seek immediate medical care, and risk reduction measures to take.  Clemon Chambers, RN, MSN, FNP-C [facility] Phone: [phone]  Clinic  MD: Oneida Alar  07/07/2014 9:24 AM

## 2014-07-07 NOTE — Patient Instructions (Signed)

## 2014-07-12 DIAGNOSIS — M150 Primary generalized (osteo)arthritis: Secondary | ICD-10-CM | POA: Diagnosis not present

## 2014-07-12 DIAGNOSIS — M545 Low back pain: Secondary | ICD-10-CM | POA: Diagnosis not present

## 2014-07-12 DIAGNOSIS — M0589 Other rheumatoid arthritis with rheumatoid factor of multiple sites: Secondary | ICD-10-CM | POA: Diagnosis not present

## 2014-07-13 ENCOUNTER — Ambulatory Visit: Payer: Medicare Other | Admitting: Cardiology

## 2014-07-13 ENCOUNTER — Other Ambulatory Visit (HOSPITAL_COMMUNITY): Payer: Medicare Other

## 2014-07-26 DIAGNOSIS — L821 Other seborrheic keratosis: Secondary | ICD-10-CM | POA: Diagnosis not present

## 2014-07-26 DIAGNOSIS — D045 Carcinoma in situ of skin of trunk: Secondary | ICD-10-CM | POA: Diagnosis not present

## 2014-07-26 DIAGNOSIS — L570 Actinic keratosis: Secondary | ICD-10-CM | POA: Diagnosis not present

## 2014-07-27 ENCOUNTER — Encounter: Payer: Self-pay | Admitting: Internal Medicine

## 2014-07-27 ENCOUNTER — Encounter (HOSPITAL_COMMUNITY): Payer: Medicare Other

## 2014-07-27 ENCOUNTER — Ambulatory Visit (INDEPENDENT_AMBULATORY_CARE_PROVIDER_SITE_OTHER): Payer: Medicare Other | Admitting: Internal Medicine

## 2014-07-27 ENCOUNTER — Ambulatory Visit: Payer: Medicare Other | Admitting: Family

## 2014-07-27 VITALS — BP 124/80 | HR 57 | Ht 72.0 in | Wt 238.8 lb

## 2014-07-27 DIAGNOSIS — I472 Ventricular tachycardia: Secondary | ICD-10-CM | POA: Diagnosis not present

## 2014-07-27 DIAGNOSIS — I10 Essential (primary) hypertension: Secondary | ICD-10-CM

## 2014-07-27 DIAGNOSIS — I255 Ischemic cardiomyopathy: Secondary | ICD-10-CM

## 2014-07-27 DIAGNOSIS — I4729 Other ventricular tachycardia: Secondary | ICD-10-CM

## 2014-07-27 LAB — MDC_IDC_ENUM_SESS_TYPE_INCLINIC
Battery Voltage: 2.97 V
Brady Statistic AP VP Percent: 0.01 %
Brady Statistic AS VP Percent: 0.02 %
Brady Statistic RA Percent Paced: 6.71 %
Date Time Interrogation Session: 20160323142632
HIGH POWER IMPEDANCE MEASURED VALUE: 61 Ohm
HighPow Impedance: 45 Ohm
Lead Channel Impedance Value: 437 Ohm
Lead Channel Sensing Intrinsic Amplitude: 2.25 mV
Lead Channel Sensing Intrinsic Amplitude: 2.875 mV
Lead Channel Setting Pacing Amplitude: 2 V
Lead Channel Setting Pacing Pulse Width: 0.4 ms
MDC IDC MSMT LEADCHNL RV IMPEDANCE VALUE: 418 Ohm
MDC IDC MSMT LEADCHNL RV SENSING INTR AMPL: 11.75 mV
MDC IDC MSMT LEADCHNL RV SENSING INTR AMPL: 13 mV
MDC IDC SET LEADCHNL RV PACING AMPLITUDE: 2.5 V
MDC IDC SET LEADCHNL RV SENSING SENSITIVITY: 0.3 mV
MDC IDC SET ZONE DETECTION INTERVAL: 470 ms
MDC IDC STAT BRADY AP VS PERCENT: 6.7 %
MDC IDC STAT BRADY AS VS PERCENT: 93.27 %
MDC IDC STAT BRADY RV PERCENT PACED: 0.03 %
Zone Setting Detection Interval: 280 ms
Zone Setting Detection Interval: 350 ms
Zone Setting Detection Interval: 450 ms

## 2014-07-27 NOTE — Patient Instructions (Signed)
Your physician wants you to follow-up in: 12 months with Dr. Allred. You will receive a reminder letter in the mail two months in advance. If you don't receive a letter, please call our office to schedule the follow-up appointment.  Remote monitoring is used to monitor your Pacemaker or ICD from home. This monitoring reduces the number of office visits required to check your device to one time per year. It allows us to keep an eye on the functioning of your device to ensure it is working properly. You are scheduled for a device check from home on 10/26/2014. You may send your transmission at any time that day. If you have a wireless device, the transmission will be sent automatically. After your physician reviews your transmission, you will receive a postcard with your next transmission date.   

## 2014-07-28 NOTE — Progress Notes (Signed)
PCP:  HODGES,FRANCISCO, MD Primary Cardiologist:  Dr Aundra Dubin  The patient presents today for routine electrophysiology followup.  Since last being seen in our clinic, the patient reports doing reasonably well.  He has had no VT over the past year.  He denies any symptoms with amiodarone.  Today, he denies symptoms of chest pain, shortness of breath, orthopnea, PND, lower extremity edema, dizziness, presyncope, syncope, or neurologic sequela.  He has received no recent ICD shocks.  The patient feels that he is tolerating medications without difficulties and is otherwise without complaint today.   Past Medical History  Diagnosis Date  . CAD (coronary artery disease)     hx of silent MI in 1993. likely an inferior MI. hx of 2D cardiogram in 3/09 showing EF of 40%. hx of myoview in [place] 3/09-nml. presented to [facility] 5/10 with VT and mildly elevated cardiac enzymes LHC (5/10): inferobasal dyskinesis with EF 35-40%. was chronic total occlusion of mid RCA with good collaterals. luminals LCA. this does not appear to be an acute cause of the 5/10 event  . HTN (hypertension)   . HLD (hyperlipidemia)   . Tobacco abuse     47 pack year hx; quit 8/09  . PAD (peripheral artery disease)     s/p L-to-R fem-fem bypass performed by Dr. Scot Dock at [facility] in 2009   . Rheumatoid arthritis(714.0)     on leflunomide  . Ischemic cardiomyopathy     EF 35-40% by LV-gram 5/10 with inferobasal dyskinesis. echo 5/10 showed EF 40% w/mild LVH, no sig. MR, inferobasal and posterobasal akinesis. echo (7/11): EF 50%, mild LVH, basal-mid inferoposterior akenesis  . 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 He has duel chamber Medtronic ICD  . Atrial flutter     s/p isthmus ablation 5/10  . Abnormal PFTs 7/11    FVC 74%, FEV1 80%, ratio 75%, TLC 78%, DLCO 68%. this suggests a mild restrictive and obstructive defect. pt did have a response to bronchodilator. these PFTs were  significantly better than the report from Dr. Alcide Clever in [place] done prior.   [name] Anxiety   . Silent myocardial infarction 1993  . CHF (congestive heart failure)    Past Surgical History  Procedure Laterality Date  . Vasectomy    . Tonsillectomy    . Femoral artery - femoral artery bypass graft  march 2009    left to right bypass, first @ [facility], second at [facility] by Dr Scot Dock  . Ablation    . Cardiac defibrillator placement    . Ep implantable device  09/22/08    Medtronic, ICD Model Number:  D274DRG, ICD Serial Number: [id number]    Current Outpatient Prescriptions  Medication Sig Dispense Refill  . amiodarone (PACERONE) 200 MG tablet TAKE 1 TABLET BY MOUTH EVERY DAY 90 tablet 1  . aspirin EC 81 MG tablet Take 1 tablet (81 mg total) by mouth daily.    . carvedilol (COREG) 12.5 MG tablet TAKE 1 TABLET BY MOUTH TWICE DAILY WITH A MEAL 180 tablet 0  . cyclobenzaprine (FLEXERIL) 5 MG tablet Take 5 mg by mouth 3 (three) times daily as needed for muscle spasms.    [name] leflunomide (ARAVA) 20 MG tablet Take 20 mg by mouth daily.      . pravastatin (PRAVACHOL) 80 MG tablet Take 1 tablet (80 mg total) by mouth daily. Pt will take 40mg  -2 tablets daily (Patient taking differently: Take 80 mg  by mouth daily. ) 90 tablet 3  . torsemide (DEMADEX) 20 MG tablet Take 1 tablet (20 mg total) by mouth daily. 90 tablet 3  . valsartan (DIOVAN) 80 MG tablet Take 1 tablet (80 mg total) by mouth 2 (two) times daily. 180 tablet 0   No current facility-administered medications for this visit.    Allergies  Allergen Reactions  . Atorvastatin Other (See Comments)    Muscle pain  . Crestor [Rosuvastatin Calcium] Other (See Comments)    Muscle pain  . Losartan Other (See Comments)    Muscle pain   . Telmisartan Other (See Comments)    Stomach ache    History   Social History  . Marital Status: Single    Spouse Name: N/A  . Number of Children: N/A  . Years of Education: N/A    Occupational History  . Not on file.   Social History Main Topics  . Smoking status: Former Smoker -- 78 years  . Smokeless tobacco: Never Used     Comment: QUIT 2009  . Alcohol Use: Yes     Comment: minimal   . Drug Use: No     Comment: prior   . Sexual Activity: Not on file   Other Topics Concern  . 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   Head- normocephalic, atraumatic Eyes-  Sclera clear, conjunctiva pink Ears- hearing intact Oropharynx- clear Neck- supple, no JVP Lymph- no cervical lymphadenopathy Lungs- Clear to ausculation bilaterally, normal work of breathing Chest- ICD pocket is well healed Heart- Regular rate and rhythm, no murmurs, rubs or gallops, PMI not laterally displaced GI- soft, NT, ND, + BS Extremities- no clubbing, cyanosis, or edema  ICD interrogation- reviewed in detail today,  See PACEART report  Assessment and Plan:  1. VT Well controlled Today I have advised that we consider reducing amidoarone to 100mg  daily.  He is clear that he does not wish to do so.   He says that he recently had surveillance labs performed in [place].  He is aware that we do not have results and that he should arrange to have them sent to [place]. Normal ICD function See Claudia Desanctis Art report No changes today  2. CAD/ischemic CM Stable No change required today Alvis Lemmings to continue to follow in ICM device clnic  3. HTN Stable No change required today  carelink Return in 1 year

## 2014-07-29 ENCOUNTER — Encounter: Payer: Self-pay | Admitting: Internal Medicine

## 2014-08-01 ENCOUNTER — Ambulatory Visit: Payer: Medicare Other | Admitting: Family

## 2014-08-01 ENCOUNTER — Encounter (HOSPITAL_COMMUNITY): Payer: Medicare Other

## 2014-08-13 ENCOUNTER — Other Ambulatory Visit: Payer: Self-pay | Admitting: Internal Medicine

## 2014-08-13 ENCOUNTER — Other Ambulatory Visit: Payer: Self-pay | Admitting: Cardiology

## 2014-08-18 ENCOUNTER — Other Ambulatory Visit (HOSPITAL_COMMUNITY): Payer: Medicare Other

## 2014-08-18 ENCOUNTER — Ambulatory Visit: Payer: Medicare Other | Admitting: Cardiology

## 2014-08-29 ENCOUNTER — Ambulatory Visit (INDEPENDENT_AMBULATORY_CARE_PROVIDER_SITE_OTHER): Payer: Medicare Other | Admitting: *Deleted

## 2014-08-29 ENCOUNTER — Encounter: Payer: Self-pay | Admitting: *Deleted

## 2014-08-29 DIAGNOSIS — N401 Enlarged prostate with lower urinary tract symptoms: Secondary | ICD-10-CM | POA: Diagnosis not present

## 2014-08-29 DIAGNOSIS — R351 Nocturia: Secondary | ICD-10-CM | POA: Diagnosis not present

## 2014-08-29 DIAGNOSIS — I255 Ischemic cardiomyopathy: Secondary | ICD-10-CM | POA: Diagnosis not present

## 2014-08-29 DIAGNOSIS — Z95810 Presence of automatic (implantable) cardiac defibrillator: Secondary | ICD-10-CM | POA: Diagnosis not present

## 2014-08-29 DIAGNOSIS — N402 Nodular prostate without lower urinary tract symptoms: Secondary | ICD-10-CM | POA: Diagnosis not present

## 2014-08-29 DIAGNOSIS — N3090 Cystitis, unspecified without hematuria: Secondary | ICD-10-CM | POA: Diagnosis not present

## 2014-08-29 NOTE — Progress Notes (Signed)
EPIC Encounter for ICM Monitoring  Patient Name: Nathan Pruitt is a 69 y.o. male Date: 08/29/2014 Primary Care Physican: Maryella Shivers, MD Primary Cardiologist: Aundra Dubin Electrophysiologist: Allred Dry Weight: 205 lbs       In the past month, have you:  1. Gained more than 2 pounds in a day or more than 5 pounds in a week? no  2. Had changes in your medications (with verification of current medications)? no  3. Had more shortness of breath than is usual for you? no  4. Limited your activity because of shortness of breath? no  5. Not been able to sleep because of shortness of breath? no  6. Had increased swelling in your feet or ankles? no  7. Had symptoms of dehydration (dizziness, dry mouth, increased thirst, decreased urine output) no  8. Had changes in sodium restriction? no  9. Been compliant with medication? Yes   ICM trend:   Follow-up plan: ICM clinic phone appointment: 10/04/14. The patient has been stable and without any change in his symptoms. No changes made today.   Copy of note sent to patient's primary care physician, primary cardiologist, and device following physician.  Alvis Lemmings, RN, BSN 08/29/2014 11:06 AM

## 2014-09-01 ENCOUNTER — Ambulatory Visit (INDEPENDENT_AMBULATORY_CARE_PROVIDER_SITE_OTHER): Payer: Medicare Other | Admitting: Cardiology

## 2014-09-01 ENCOUNTER — Ambulatory Visit (HOSPITAL_COMMUNITY): Payer: Medicare Other | Attending: Internal Medicine | Admitting: Radiology

## 2014-09-01 ENCOUNTER — Encounter: Payer: Self-pay | Admitting: Cardiology

## 2014-09-01 VITALS — BP 122/66 | HR 52 | Ht 71.0 in | Wt 206.1 lb

## 2014-09-01 DIAGNOSIS — I259 Chronic ischemic heart disease, unspecified: Secondary | ICD-10-CM

## 2014-09-01 DIAGNOSIS — R7309 Other abnormal glucose: Secondary | ICD-10-CM | POA: Diagnosis not present

## 2014-09-01 DIAGNOSIS — I739 Peripheral vascular disease, unspecified: Secondary | ICD-10-CM

## 2014-09-01 DIAGNOSIS — I472 Ventricular tachycardia, unspecified: Secondary | ICD-10-CM

## 2014-09-01 DIAGNOSIS — I2510 Atherosclerotic heart disease of native coronary artery without angina pectoris: Secondary | ICD-10-CM | POA: Insufficient documentation

## 2014-09-01 DIAGNOSIS — I4729 Other ventricular tachycardia: Secondary | ICD-10-CM

## 2014-09-01 DIAGNOSIS — I255 Ischemic cardiomyopathy: Secondary | ICD-10-CM

## 2014-09-01 DIAGNOSIS — E785 Hyperlipidemia, unspecified: Secondary | ICD-10-CM | POA: Diagnosis not present

## 2014-09-01 LAB — CK: Total CK: 74 U/L (ref 7–232)

## 2014-09-01 LAB — LIPID PANEL
Cholesterol: 235 mg/dL — ABNORMAL HIGH (ref 0–200)
HDL: 31.6 mg/dL — AB (ref >39.00)
LDL Cholesterol: 165 mg/dL — ABNORMAL HIGH (ref 0–99)
NonHDL: 203.4
TRIGLYCERIDES: 194 mg/dL — AB (ref 0.0–149.0)
Total CHOL/HDL Ratio: 7
VLDL: 38.8 mg/dL (ref 0.0–40.0)

## 2014-09-01 LAB — TSH: TSH: 5.37 u[IU]/mL — ABNORMAL HIGH (ref 0.35–4.50)

## 2014-09-01 LAB — HEPATIC FUNCTION PANEL
ALT: 13 U/L (ref 0–53)
AST: 17 U/L (ref 0–37)
Albumin: 4.2 g/dL (ref 3.5–5.2)
Alkaline Phosphatase: 96 U/L (ref 39–117)
BILIRUBIN DIRECT: 0.1 mg/dL (ref 0.0–0.3)
BILIRUBIN TOTAL: 0.4 mg/dL (ref 0.2–1.2)
Total Protein: 7.2 g/dL (ref 6.0–8.3)

## 2014-09-01 LAB — BASIC METABOLIC PANEL
BUN: 27 mg/dL — ABNORMAL HIGH (ref 6–23)
CALCIUM: 9.2 mg/dL (ref 8.4–10.5)
CO2: 27 mEq/L (ref 19–32)
Chloride: 101 mEq/L (ref 96–112)
Creatinine, Ser: 1.49 mg/dL (ref 0.40–1.50)
GFR: 49.6 mL/min — ABNORMAL LOW (ref >60.00)
GLUCOSE: 102 mg/dL — AB (ref 70–99)
Potassium: 4.5 mEq/L (ref 3.5–5.1)
Sodium: 134 mEq/L — ABNORMAL LOW (ref 135–145)

## 2014-09-01 LAB — HEMOGLOBIN A1C: Hgb A1c MFr Bld: 6 % (ref 4.6–6.5)

## 2014-09-01 MED ORDER — PRAVASTATIN SODIUM 40 MG PO TABS: 40.0000 mg | ORAL_TABLET | Freq: Every evening | ORAL | Status: DC

## 2014-09-01 MED ORDER — VALSARTAN 80 MG PO TABS: 80.0000 mg | ORAL_TABLET | Freq: Two times a day (BID) | ORAL | Status: DC

## 2014-09-01 MED ORDER — CHOLESTYRAMINE 4 GM/DOSE PO POWD: 4.0000 g | Freq: Two times a day (BID) | ORAL | Status: DC

## 2014-09-01 MED ORDER — AMIODARONE HCL 200 MG PO TABS: 200.0000 mg | ORAL_TABLET | Freq: Every day | ORAL | Status: DC

## 2014-09-01 NOTE — Progress Notes (Signed)
Patient ID: Nathan Pruitt, male   DOB: 1945/12/06, 69 y.o.   MRN: [id number] PCP: Dr. Maryella Shivers  69 yo with history of CAD, ischemic CMP, and VT s/p ICD placement returns for followup.  He is on amiodarone.  Echo today showed EF 40-45% with wall motion abnormalities.  Symptomatically, he has been stable.  He is not very active.  No chest pain.   He is still short of breath with heavier activity.  No dyspnea walking on flat ground or in stores.  He is now back on pravastatin 40 mg daily (unable to tolerate 80 mg daily due to myalgias).  No lightheadedness.  No orthopnea or PND.    Labs (11/10): K 4.9, creatinine 1.4, LDL 70, HDL 31, LFTs normal Labs (3/11): K 4.4, creatinine 1.3, LDL 61, HDL 35 Labs (7/11): LDL 62, HDL 23 Labs (1/12): K 4.8, LFTs normal, creatinine 1.44, LDL 69, HDL 33 Labs (7/12): LFTs normal, LDL 81, HDL 36, HCT normal, K 4.3, creatinine 1.3, BNP 116, TSH normal Labs (1/13): LDL 39, HDl 31, LFTs normal, TSH  Labs (7/13): TSH normal, LFTs normal, LDL 69, HDL 40 Labs (8/13): K 4.5, creatinine 1.6 Labs (10/13): K 4.4, creatinine 1.4 Labs (3/14): LDL 108, HDL 30, LFTs normal Labs (9/14): K 4.2, creatinine 1.6, LFTs normal, LDL 114, TSH 10.6 (elevated), free T4/free T3 normal Labs (2/15): K 4.6, creatinine 1.5, LFTs normal, TSH 7.6 (elevated), free T4 and free T3 normal Labs (3/15): K 3.6, creatinine 1.5, LFTs normal, TSH normal Labs (9/15): K 4.3, creatinine 1.6, LFTs normal, TSH normal, LDL 115, HDL 24  Allergies (verified):  No Known Drug Allergies  Past Medical History: 1. Coronary artery disease.       a.     The patient reports history of silent MI in 1993.  This was likely an inferior MI (see below).       b.     The patient reports history of 2-D echocardiogram in March        2009, showing an EF of 40%.       c.     The patient reports history of Myoview in [facility] in March 2009, which        was per his report normal.       d.     The patient presented  to [facility] in 5/10 with VT and mildly elevated cardiac enzymes.         LHC (5/10):  Inferobasal dyskinesis with EF 35-40%.  There was chronic total occlusion of the         mid RCA with good collaterals.  Luminals LCA.  This did not appear to be an acute cause of the 5/10 event.  2. Hypertension.  3. Hyperlipidemia.  4. Remote tobacco abuse with 47-pack-year history, quitting in August 2009.  5. Peripheral arterial disease.       a.     Status post left-to-right fem-fem bypass performed in        [place] in March 2009.       b.     Status post redo left-to-right fem-fem bypass performed by        Dr. Scot Dock at [facility] in 2009.       c.     ABIs normal 3/15, ABIs normal 3/16.  6. Rheumatoid arthritis, on leflunomide.  7.  Ischemic cardiomyopathy:  EF 35-40% by LV-gram 5/10 with inferobasal dyskinesis.  Echo 5/10 showed EF 40% with mild LVH,  no significant MR, inferobasal and posterobasal akinesis.  Echo (7/11): EF 50%, mild LVH, basal-mid inferoposterior akinesis.  Echo (7/12): EF 45-50% with basal anterolateral, basal posterior, and basal to mid inferior akinesis.  Echo (4/16): EF 40-45%, basal to mid inferolateral AK, basal inferior AK, basal to mid anterolateral HK.  8.  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  He has a dual chamber Medtronic ICD.  9.  Atrial flutter:  Status post isthmus ablation 5/10.   10.  PFTs (7/11): FVC 74%, FEV1 80%, ratio 75%, TLC 78%, DLCO 68%.  This suggests a mild restrictive defect and a mild obstructive defect.  He did have response to bronchodilator. These PFTs were significantly better than the report from Dr. Alcide Clever in [place] done prior.  He had last PFTs in 2/12 with no significant change.  11.  Anxiety 12.  Chronic cough: No relief with change from ACEI to ARB or with trial of PPI.   Family History: Mother died in her late 99s with gastric cancer.  Father died in his late 68s with throat  cancer and PVD.   He had a brother who died at 60 of an MI.   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 -47ppy hx, quit 2009.  Alcohol Use - yes-minimal Regular Exercise - yes Drug Use - no (prior)  Review of Systems        All systems reviewed and negative except as per HPI.   Current Outpatient Prescriptions  Medication Sig Dispense Refill  . amiodarone (PACERONE) 200 MG tablet Take 1 tablet (200 mg total) by mouth daily. 90 tablet 3  . aspirin EC 81 MG tablet Take 1 tablet (81 mg total) by mouth daily.    . carvedilol (COREG) 12.5 MG tablet TAKE 1 TABLET BY MOUTH TWICE DAILY MEAL 180 tablet 2  . cyclobenzaprine (FLEXERIL) 5 MG tablet Take 5 mg by mouth 3 (three) times daily as needed for muscle spasms.    [name] leflunomide (ARAVA) 20 MG tablet Take 20 mg by mouth daily.      [name] torsemide (DEMADEX) 20 MG tablet Take 1 tablet (20 mg total) by mouth daily. 90 tablet 3  . traMADol (ULTRAM) 50 MG tablet Take 1 tablet by mouth as needed.  2  . valsartan (DIOVAN) 80 MG tablet Take 1 tablet (80 mg total) by mouth 2 (two) times daily. 180 tablet 3  . cholestyramine (QUESTRAN) 4 GM/DOSE powder Take 1 packet (4 g total) by mouth 2 (two) times daily with a meal. 720 g 3  . pravastatin (PRAVACHOL) 40 MG tablet Take 1 tablet (40 mg total) by mouth every evening. 90 tablet 3   No current facility-administered medications for this visit.    BP 122/66 mmHg  Pulse 52  Ht 5\' 11"  (1.803 m)  Wt 206 lb 1.9 oz (93.495 kg)  BMI 28.76 kg/m2 General:  Well developed, well nourished, in no acute distress. Neck:  Neck supple, no JVD. No masses, thyromegaly or abnormal cervical nodes. Lungs:  Slightly decreased breath sounds bilaterally.  Heart:  Non-displaced PMI, chest non-tender; regular rate and rhythm, S1, S2 without murmurs, rubs or gallops. Carotid upstroke normal, no bruit. Normal abdominal aortic size, no bruits. Femorals normal pulses,  no bruits. Pedals normal pulses. No edema, no varicosities. Abdomen:  Bowel sounds positive; abdomen soft and non-tender without masses, organomegaly, or hernias noted. No hepatosplenomegaly. Extremities:  No clubbing or  cyanosis. Neurologic:  Alert and oriented x 3. Psych:  Normal affect.  Assessment/Plan:  CARDIOMYOPATHY, ISCHEMIC Patient does not appear volume overloaded on exam and has stable NYHA class II-III dyspnea.  Echo today showed EF 40-45% (stable). - Continue current torsemide, valsartan, and Coreg. - BMET today.  CAD No chest pain. Continue ASA, statin, Coreg, ARB.  HYPERLIPIDEMIA  Myalgias with Lipitor and Crestor.  Livalo tolerated but too expensive.  Now on pravastatin 40 mg daily. He had myalgias with 80 mg pravastatin.  LDL has been too high on pravastatin 40 daily.  Zetia is too expensive and he does not want an injectable (Praluent or Repatha).  This leaves bile acid binding resins.  Cholestyramine is on his formulary, I'll add 4 g bid to his regimen.  Check lipids in 3 months.  VENTRICULAR TACHYCARDIA  Continue amiodarone and Coreg. Also has ICD.  Repeat LFTs/TSH today. He knows to have a yearly eye exam.  PAD He denies claudication.  Normal ABIs in 3/16, followed by [facility].    Loralie Champagne 09/01/2014

## 2014-09-01 NOTE — Patient Instructions (Signed)
Medication Instructions:  Decrease pravastatin to 40mg  daily in the evening.  Start cholestyramine 4G by mouth two times a day with a meal.  Labwork: BMET/Lipid profile/liver profile/TSH/HGB A1c/CK today  Your physician recommends that you return for a FASTING lipid profile /liver profile/TSH/BMET in 3 months. I have given you an order for this. Please fax to Dr Aundra Dubin --[phone].   Testing/Procedures: None today.  Follow-Up: Your physician wants you to follow-up in: 6 months with Dr Aundra Dubin. (October 2016).You will receive a reminder letter in the mail two months in advance. If you don't receive a letter, please call our office to schedule the follow-up appointment.

## 2014-09-01 NOTE — Progress Notes (Signed)
Echocardiogram performed by Bethany McMahill, RDCS.  

## 2014-09-02 ENCOUNTER — Encounter: Payer: Self-pay | Admitting: Cardiology

## 2014-09-05 ENCOUNTER — Encounter: Payer: Self-pay | Admitting: Cardiology

## 2014-09-06 ENCOUNTER — Encounter: Payer: Self-pay | Admitting: Cardiology

## 2014-09-08 DIAGNOSIS — L570 Actinic keratosis: Secondary | ICD-10-CM | POA: Diagnosis not present

## 2014-09-08 DIAGNOSIS — L821 Other seborrheic keratosis: Secondary | ICD-10-CM | POA: Diagnosis not present

## 2014-09-12 ENCOUNTER — Encounter: Payer: Self-pay | Admitting: Cardiology

## 2014-09-12 DIAGNOSIS — E785 Hyperlipidemia, unspecified: Secondary | ICD-10-CM | POA: Diagnosis not present

## 2014-09-12 DIAGNOSIS — R7989 Other specified abnormal findings of blood chemistry: Secondary | ICD-10-CM | POA: Diagnosis not present

## 2014-09-12 DIAGNOSIS — I2510 Atherosclerotic heart disease of native coronary artery without angina pectoris: Secondary | ICD-10-CM | POA: Diagnosis not present

## 2014-09-12 DIAGNOSIS — I472 Ventricular tachycardia: Secondary | ICD-10-CM | POA: Diagnosis not present

## 2014-09-19 ENCOUNTER — Telehealth: Payer: Self-pay | Admitting: Pharmacist

## 2014-09-19 NOTE — Telephone Encounter (Signed)
Received request from Dr. Aundra Dubin to review pt's chart to see if he would qualify for [facility]CSK-9.  Spoke with pt and reviewed his chart.  He has taken the following medications:   Feb 2009- Crestor 10mg  (with PCP) May 2009- Crestor 20mg  (with PCP) 08/2011- Lipitor 80mg  daily 07/2012- Livalo 2mg   07/2013- pravastatin 40mg   05/2013- pravastatin 80mg   05/2012- simvastatin 40mg  08/2011- simvastatin 80mg  08/2010- Vytorin 10/80  Given his list of intolerances, he could qualify for PCSK-9.  Had 45 minute discussion with patient regarding financial obligations with PCSK-9.  Even with patient assistance, he is concerned over this issue and is not thrilled with having to take an injectable medication.    Other solution of a low dose of simvastatin 20mg (dose limited due to amiodarone use) with Zetia and see if this could get him closer to goal rather than going to PCSK-9.  He is agreeable to considering this but would like to continue lifestyle modifications for now.  He will have his labs rechecked in July and we can determine at that point if this would be a viable option.   He is agreeable to this plan.

## 2014-09-23 DIAGNOSIS — Z79899 Other long term (current) drug therapy: Secondary | ICD-10-CM | POA: Diagnosis not present

## 2014-09-23 DIAGNOSIS — M0579 Rheumatoid arthritis with rheumatoid factor of multiple sites without organ or systems involvement: Secondary | ICD-10-CM | POA: Diagnosis not present

## 2014-09-23 DIAGNOSIS — H2513 Age-related nuclear cataract, bilateral: Secondary | ICD-10-CM | POA: Diagnosis not present

## 2014-10-04 ENCOUNTER — Encounter: Payer: Self-pay | Admitting: Internal Medicine

## 2014-10-04 ENCOUNTER — Encounter: Payer: Self-pay | Admitting: *Deleted

## 2014-10-04 ENCOUNTER — Ambulatory Visit (INDEPENDENT_AMBULATORY_CARE_PROVIDER_SITE_OTHER): Payer: Medicare Other | Admitting: *Deleted

## 2014-10-04 DIAGNOSIS — I255 Ischemic cardiomyopathy: Secondary | ICD-10-CM | POA: Diagnosis not present

## 2014-10-04 DIAGNOSIS — Z95810 Presence of automatic (implantable) cardiac defibrillator: Secondary | ICD-10-CM | POA: Diagnosis not present

## 2014-10-04 NOTE — Progress Notes (Signed)
EPIC Encounter for ICM Monitoring  Patient Name: Nathan Pruitt is a 69 y.o. male Date: 10/04/2014 Primary Care Physican: Maryella Shivers, MD Primary Cardiologist: Aundra Dubin Electrophysiologist: Allred Dry Weight: 200 lbs       In the past month, have you:  1. Gained more than 2 pounds in a day or more than 5 pounds in a week? No. The patient's weight is down from around 205 lbs.   2. Had changes in your medications (with verification of current medications)? no  3. Had more shortness of breath than is usual for you? no  4. Limited your activity because of shortness of breath? no  5. Not been able to sleep because of shortness of breath? no  6. Had increased swelling in your feet or ankles? no  7. Had symptoms of dehydration (dizziness, dry mouth, increased thirst, decreased urine output) no  8. Had changes in sodium restriction? no  9. Been compliant with medication? Yes   ICM trend:   Follow-up plan: ICM clinic phone appointment : 11/08/14. No changes made today.   Copy of note sent to patient's primary care physician, primary cardiologist, and device following physician.  Alvis Lemmings, RN, BSN 10/04/2014 2:03 PM

## 2014-10-25 NOTE — Telephone Encounter (Signed)
Closed encounter °

## 2014-11-08 ENCOUNTER — Encounter: Payer: Self-pay | Admitting: Internal Medicine

## 2014-11-08 ENCOUNTER — Ambulatory Visit (INDEPENDENT_AMBULATORY_CARE_PROVIDER_SITE_OTHER): Payer: Medicare Other | Admitting: *Deleted

## 2014-11-08 DIAGNOSIS — Z95810 Presence of automatic (implantable) cardiac defibrillator: Secondary | ICD-10-CM

## 2014-11-08 DIAGNOSIS — I255 Ischemic cardiomyopathy: Secondary | ICD-10-CM

## 2014-11-08 NOTE — Progress Notes (Signed)
Remote ICD transmission.   

## 2014-11-09 ENCOUNTER — Encounter: Payer: Self-pay | Admitting: Internal Medicine

## 2014-11-09 NOTE — Progress Notes (Signed)
EPIC Encounter for ICM Monitoring  Patient Name: Nathan Pruitt is a 69 y.o. male Date: 11/09/2014 Primary Care Physican: Maryella Shivers, MD Primary Cardiologist: Aundra Dubin Electrophysiologist: Allred Dry Weight: 200 lbs       In the past month, have you:  1. Gained more than 2 pounds in a day or more than 5 pounds in a week? No, however, weights will flucuate from 200-204 lbs.   2. Had changes in your medications (with verification of current medications)? no  3. Had more shortness of breath than is usual for you? no  4. Limited your activity because of shortness of breath? no  5. Not been able to sleep because of shortness of breath? no  6. Had increased swelling in your feet or ankles? no  7. Had symptoms of dehydration (dizziness, dry mouth, increased thirst, decreased urine output) no  8. Had changes in sodium restriction? no  9. Been compliant with medication? Yes   ICM trend:   Follow-up plan: ICM clinic phone appointment: 12/12/14  Copy of note sent to patient's primary care physician, primary cardiologist, and device following physician.  Alvis Lemmings, RN, BSN 11/09/2014 2:11 PM

## 2014-11-09 NOTE — Addendum Note (Signed)
Addended by: Alvis Lemmings C on: 11/09/2014 02:18 PM   Modules accepted: Level of Service

## 2014-11-12 LAB — CUP PACEART REMOTE DEVICE CHECK
Brady Statistic AP VS Percent: 1.63 %
Brady Statistic AS VP Percent: 0.01 %
Brady Statistic AS VS Percent: 98.35 %
Date Time Interrogation Session: 20160705052508
HIGH POWER IMPEDANCE MEASURED VALUE: 58 Ohm
HighPow Impedance: 42 Ohm
Lead Channel Impedance Value: 437 Ohm
Lead Channel Sensing Intrinsic Amplitude: 1.75 mV
Lead Channel Sensing Intrinsic Amplitude: 10.375 mV
Lead Channel Sensing Intrinsic Amplitude: 10.375 mV
Lead Channel Setting Pacing Amplitude: 2 V
Lead Channel Setting Pacing Amplitude: 2.5 V
Lead Channel Setting Sensing Sensitivity: 0.3 mV
MDC IDC MSMT BATTERY VOLTAGE: 2.92 V
MDC IDC MSMT LEADCHNL RA SENSING INTR AMPL: 1.75 mV
MDC IDC MSMT LEADCHNL RV IMPEDANCE VALUE: 418 Ohm
MDC IDC SET LEADCHNL RV PACING PULSEWIDTH: 0.4 ms
MDC IDC SET ZONE DETECTION INTERVAL: 470 ms
MDC IDC STAT BRADY AP VP PERCENT: 0 %
MDC IDC STAT BRADY RA PERCENT PACED: 1.64 %
MDC IDC STAT BRADY RV PERCENT PACED: 0.02 %
Zone Setting Detection Interval: 280 ms
Zone Setting Detection Interval: 350 ms
Zone Setting Detection Interval: 450 ms

## 2014-11-16 ENCOUNTER — Encounter: Payer: Self-pay | Admitting: Cardiology

## 2014-11-28 ENCOUNTER — Encounter: Payer: Self-pay | Admitting: Cardiology

## 2014-11-29 DIAGNOSIS — L728 Other follicular cysts of the skin and subcutaneous tissue: Secondary | ICD-10-CM | POA: Diagnosis not present

## 2014-11-29 DIAGNOSIS — L821 Other seborrheic keratosis: Secondary | ICD-10-CM | POA: Diagnosis not present

## 2014-12-12 ENCOUNTER — Ambulatory Visit (INDEPENDENT_AMBULATORY_CARE_PROVIDER_SITE_OTHER): Payer: Medicare Other | Admitting: *Deleted

## 2014-12-12 ENCOUNTER — Encounter: Payer: Self-pay | Admitting: Internal Medicine

## 2014-12-12 DIAGNOSIS — I255 Ischemic cardiomyopathy: Secondary | ICD-10-CM | POA: Diagnosis not present

## 2014-12-12 DIAGNOSIS — Z95810 Presence of automatic (implantable) cardiac defibrillator: Secondary | ICD-10-CM | POA: Diagnosis not present

## 2014-12-13 NOTE — Progress Notes (Signed)
EPIC Encounter for ICM Monitoring  Patient Name: Nathan Pruitt is a 69 y.o. male Date: 12/13/2014 Primary Care Physican: Maryella Shivers, MD Primary Cardiologist: Aundra Dubin Electrophysiologist: Allred  Dry Weight: 187 lbs       In the past month, have you:  1. Gained more than 2 pounds in a day or more than 5 pounds in a week? No.  Patient reported he has lost weight due to dieting and concern with cholesterol levels.    2. Had changes in your medications (with verification of current medications)? no  3. Had more shortness of breath than is usual for you? no  4. Limited your activity because of shortness of breath? no  5. Not been able to sleep because of shortness of breath? no  6. Had increased swelling in your feet or ankles? no  7. Had symptoms of dehydration (dizziness, dry mouth, increased thirst, decreased urine output) no  8. Had changes in sodium restriction? no  9. Been compliant with medication? Yes   ICM trend:   Follow-up plan: ICM clinic phone appointment 01/16/2015.  Optivol stable for patient.  Patient reported a fall 2 weeks ago and bruised rib area but denied any breathing difficulties and is feeling better. He reported he is having some hip and leg pain but thinks it is due to having a laptop sitting on his legs.  No changes today.  Copy of note sent to patient's primary care physician, primary cardiologist, and device following physician.  Rosalene Billings, RN, BSN 12/13/2014 11:53 AM

## 2015-01-02 ENCOUNTER — Encounter: Payer: Self-pay | Admitting: Internal Medicine

## 2015-01-16 ENCOUNTER — Ambulatory Visit (INDEPENDENT_AMBULATORY_CARE_PROVIDER_SITE_OTHER): Payer: Medicare Other

## 2015-01-16 DIAGNOSIS — Z95810 Presence of automatic (implantable) cardiac defibrillator: Secondary | ICD-10-CM

## 2015-01-16 DIAGNOSIS — E785 Hyperlipidemia, unspecified: Secondary | ICD-10-CM | POA: Diagnosis not present

## 2015-01-16 DIAGNOSIS — M069 Rheumatoid arthritis, unspecified: Secondary | ICD-10-CM | POA: Diagnosis not present

## 2015-01-16 DIAGNOSIS — I255 Ischemic cardiomyopathy: Secondary | ICD-10-CM | POA: Diagnosis not present

## 2015-01-16 DIAGNOSIS — I472 Ventricular tachycardia: Secondary | ICD-10-CM | POA: Diagnosis not present

## 2015-01-16 DIAGNOSIS — I2510 Atherosclerotic heart disease of native coronary artery without angina pectoris: Secondary | ICD-10-CM | POA: Diagnosis not present

## 2015-01-16 DIAGNOSIS — Z79899 Other long term (current) drug therapy: Secondary | ICD-10-CM | POA: Diagnosis not present

## 2015-01-18 ENCOUNTER — Telehealth: Payer: Self-pay

## 2015-01-18 ENCOUNTER — Encounter: Payer: Self-pay | Admitting: Cardiology

## 2015-01-18 ENCOUNTER — Encounter: Payer: Self-pay | Admitting: Internal Medicine

## 2015-01-18 DIAGNOSIS — M109 Gout, unspecified: Secondary | ICD-10-CM | POA: Diagnosis not present

## 2015-01-18 DIAGNOSIS — M0589 Other rheumatoid arthritis with rheumatoid factor of multiple sites: Secondary | ICD-10-CM | POA: Diagnosis not present

## 2015-01-18 DIAGNOSIS — M545 Low back pain: Secondary | ICD-10-CM | POA: Diagnosis not present

## 2015-01-18 DIAGNOSIS — M150 Primary generalized (osteo)arthritis: Secondary | ICD-10-CM | POA: Diagnosis not present

## 2015-01-18 NOTE — Telephone Encounter (Signed)
ICM transmission received.  Attempted call to patient and left message for return call. 

## 2015-01-18 NOTE — Progress Notes (Addendum)
EPIC Encounter for ICM Monitoring  Patient Name: Nathan Pruitt is a 69 y.o. male Date: 01/18/2015 Primary Care Physican: Maryella Shivers, MD Primary Cardiologist: Aundra Dubin Electrophysiologist: Allred Dry Weight: 192 lbs       In the past month, have you:  1. Gained more than 2 pounds in a day or more than 5 pounds in a week? Yes, he has gained 2-3 pounds  2. Had changes in your medications (with verification of current medications)? no  3. Had more shortness of breath than is usual for you? no  4. Limited your activity because of shortness of breath? no  5. Not been able to sleep because of shortness of breath? no  6. Had increased swelling in your feet or ankles? no  7. Had symptoms of dehydration (dizziness, dry mouth, increased thirst, decreased urine output) no  8. Had changes in sodium restriction? no  9. Been compliant with medication? Yes   ICM trend:   Follow-up plan: ICM clinic phone appointment 01/25/2015.  Optivol transmission significantly below baseline ~12/27/2014 to 01/16/2015.  Patient reported he has not had any changes and denied any HF symptoms.   He stated he had been on a diet and recently eating more foods but did not think they were high in sodium.  Education given to check food labels for sodium amount and track for a day to see if it is >2000mg  and he agreed.   He reported he does not have any increase in urine output after taking prescribed dosage of Torsemide 20mg  daily but does report he has prostrate problems.    He reported his physician thinks that he may have  gout in one finger.               Advised patient to increase Torsemide 20mg  to bid x 2 days and then resume prescribed dosage of 20mg  daily.  Requested to send manual transmission in a week, 01/25/2015, to check repeat Optivol fluid level.         Reviewed dehydration and HF symptoms to report.  Copy of note sent to patient's primary care physician, primary cardiologist, and device following  physician.  Rosalene Billings, RN, CCM 01/18/2015 3:03 PM   Received email from patient stating he checked some of the foods he has been eating. He reported the following: He has been eating 2 chicken dogs that have 620mg  sodium and 4 muffins that have 460mg  per muffin.  He reported he will not be eating those high sodium foods.  He was having a total of 3080 mg salt per day with just those foods which did not include all his foods for the day.

## 2015-01-18 NOTE — Telephone Encounter (Signed)
Spoke to patient

## 2015-01-19 ENCOUNTER — Encounter: Payer: Self-pay | Admitting: Cardiology

## 2015-01-20 ENCOUNTER — Other Ambulatory Visit: Payer: Self-pay | Admitting: *Deleted

## 2015-01-20 MED ORDER — PRAVASTATIN SODIUM 80 MG PO TABS: 80.0000 mg | ORAL_TABLET | Freq: Every evening | ORAL | Status: DC

## 2015-01-20 MED ORDER — TORSEMIDE 20 MG PO TABS: ORAL_TABLET | ORAL | Status: DC

## 2015-01-25 ENCOUNTER — Encounter: Payer: Self-pay | Admitting: Internal Medicine

## 2015-01-25 ENCOUNTER — Ambulatory Visit (INDEPENDENT_AMBULATORY_CARE_PROVIDER_SITE_OTHER): Payer: Medicare Other

## 2015-01-25 DIAGNOSIS — I255 Ischemic cardiomyopathy: Secondary | ICD-10-CM | POA: Diagnosis not present

## 2015-01-25 DIAGNOSIS — Z95810 Presence of automatic (implantable) cardiac defibrillator: Secondary | ICD-10-CM

## 2015-01-25 NOTE — Progress Notes (Signed)
EPIC Encounter for ICM Monitoring  Patient Name: Nathan Pruitt is a 69 y.o. male Date: 01/25/2015 Primary Care Physican: Maryella Shivers, MD Primary Cardiologist: Aundra Dubin Electrophysiologist: Allred Dry Weight: 182 lbs (dropped from 192)       In the past month, have you:  1. Gained more than 2 pounds in a day or more than 5 pounds in a week? no  2. Had changes in your medications (with verification of current medications)? Torsemide 40mg  1 tablet daily and Pravastatin 80mg  1 tablet daily  3. Had more shortness of breath than is usual for you? no  4. Limited your activity because of shortness of breath? no  5. Not been able to sleep because of shortness of breath? no  6. Had increased swelling in your feet or ankles? no  7. Had symptoms of dehydration (dizziness, dry mouth, increased thirst, decreased urine output) no  8. Had changes in sodium restriction? Yes, he now reviews all food labels for amount of sodium in foods.  He reported low fat foods are high in sodium.    9. Been compliant with medication? Yes   ICM trend: 01/25/2015   Follow-up plan: ICM clinic phone appointment 03/03/2015.  Impedance trending toward baseline.  He reported no HF symptoms and increase in Torsemide from 20mg  to 40mg  is effective for eliminating fluid and dropping his weight 8-10lbs.  Reviewed HF symptoms to report any symptoms.  No changes today.  Copy of note sent to patient's primary care physician, primary cardiologist, and device following physician.  Rosalene Billings, RN, CCM 01/25/2015 2:12 PM

## 2015-02-02 ENCOUNTER — Other Ambulatory Visit: Payer: Self-pay | Admitting: Internal Medicine

## 2015-02-03 ENCOUNTER — Other Ambulatory Visit: Payer: Self-pay | Admitting: Internal Medicine

## 2015-02-03 ENCOUNTER — Encounter: Payer: Self-pay | Admitting: Cardiology

## 2015-02-03 ENCOUNTER — Other Ambulatory Visit: Payer: Self-pay | Admitting: *Deleted

## 2015-02-03 MED ORDER — CARVEDILOL 12.5 MG PO TABS: 12.5000 mg | ORAL_TABLET | Freq: Two times a day (BID) | ORAL | Status: DC

## 2015-02-03 NOTE — Telephone Encounter (Signed)
Contacted patient and he requested to note his chart to get a flu shot at his up coming appointment. Patient also requesting refill on carvedilol. Patient also wants to discuss at his next visit the prescription process.

## 2015-02-27 ENCOUNTER — Encounter: Payer: Self-pay | Admitting: Cardiology

## 2015-02-27 DIAGNOSIS — Z79899 Other long term (current) drug therapy: Secondary | ICD-10-CM | POA: Diagnosis not present

## 2015-02-27 DIAGNOSIS — E785 Hyperlipidemia, unspecified: Secondary | ICD-10-CM | POA: Diagnosis not present

## 2015-02-27 DIAGNOSIS — N401 Enlarged prostate with lower urinary tract symptoms: Secondary | ICD-10-CM | POA: Diagnosis not present

## 2015-02-27 DIAGNOSIS — M069 Rheumatoid arthritis, unspecified: Secondary | ICD-10-CM | POA: Diagnosis not present

## 2015-02-27 DIAGNOSIS — I472 Ventricular tachycardia: Secondary | ICD-10-CM | POA: Diagnosis not present

## 2015-02-27 DIAGNOSIS — N3020 Other chronic cystitis without hematuria: Secondary | ICD-10-CM | POA: Diagnosis not present

## 2015-02-27 DIAGNOSIS — R351 Nocturia: Secondary | ICD-10-CM | POA: Diagnosis not present

## 2015-02-27 DIAGNOSIS — I255 Ischemic cardiomyopathy: Secondary | ICD-10-CM | POA: Diagnosis not present

## 2015-02-27 DIAGNOSIS — I2510 Atherosclerotic heart disease of native coronary artery without angina pectoris: Secondary | ICD-10-CM | POA: Diagnosis not present

## 2015-02-27 DIAGNOSIS — N402 Nodular prostate without lower urinary tract symptoms: Secondary | ICD-10-CM | POA: Diagnosis not present

## 2015-02-27 DIAGNOSIS — R972 Elevated prostate specific antigen [PSA]: Secondary | ICD-10-CM | POA: Diagnosis not present

## 2015-03-02 ENCOUNTER — Encounter: Payer: Self-pay | Admitting: Cardiology

## 2015-03-03 ENCOUNTER — Ambulatory Visit (INDEPENDENT_AMBULATORY_CARE_PROVIDER_SITE_OTHER): Payer: Medicare Other | Admitting: *Deleted

## 2015-03-03 ENCOUNTER — Other Ambulatory Visit: Payer: Self-pay | Admitting: *Deleted

## 2015-03-03 DIAGNOSIS — I4729 Other ventricular tachycardia: Secondary | ICD-10-CM

## 2015-03-03 DIAGNOSIS — R7989 Other specified abnormal findings of blood chemistry: Secondary | ICD-10-CM

## 2015-03-03 DIAGNOSIS — I255 Ischemic cardiomyopathy: Secondary | ICD-10-CM | POA: Diagnosis not present

## 2015-03-03 DIAGNOSIS — I472 Ventricular tachycardia: Secondary | ICD-10-CM

## 2015-03-03 NOTE — Progress Notes (Signed)
EPIC Encounter for ICM Monitoring  Patient Name: Nathan Pruitt is a 69 y.o. male Date: 03/03/2015 Primary Care Physican: Maryella Shivers, MD Primary Cardiologist: Aundra Dubin Electrophysiologist: Allred Dry Weight: 172 lb       In the past month, have you:  1. Gained more than 2 pounds in a day or more than 5 pounds in a week? no  2. Had changes in your medications (with verification of current medications)? no  3. Had more shortness of breath than is usual for you? no  4. Limited your activity because of shortness of breath? no  5. Not been able to sleep because of shortness of breath? no  6. Had increased swelling in your feet or ankles? no  7. Had symptoms of dehydration (dizziness, dry mouth, increased thirst, decreased urine output) no  8. Had changes in sodium restriction? no  9. Been compliant with medication? Yes   ICM trend:   Follow-up plan: ICM clinic phone appointment 04/10/2015.  Optivol impedance below baseline suggesting patient may be retaining fluid.  Patient reported no symptoms.  Attempted to explain device fluid level readings and he reported he may have a high fluid level normally.  Continued education from last month regarding limiting high sodium foods. He reported he could not eat any less salt than he eats now and not sure how beneficial it is to have fluid levels read monthly. He has been eating salami deli meat and ramen noodles and provided information those would be high sodium foods.  Attempted to explained HF condition and goal of ICM clinic is to help provide education to help manage the symptoms and what may cause it to worse such as diet.  Explained high sodium foods increases work load of the heart, causes fluid retention and can lead to hospitalizations.  He asked for explanation of Amiodarone and Carvedilol and attempted to explained the difference of how they work on the heart.  He has an appointment with Dr Aundra Dubin next week and he will discuss his  concerns.  Explained the ICM clinic is a voluntary program and he can opt out at any time and his device will still be monitored every 3 months by the device techs. He stated he would like to continue at this time.     Copy of note sent to patient's primary care physician, primary cardiologist, and device following physician.  Rosalene Billings, RN, CCM 03/03/2015 2:20 PM

## 2015-03-03 NOTE — Progress Notes (Signed)
Remote ICD transmission.   

## 2015-03-09 ENCOUNTER — Ambulatory Visit (INDEPENDENT_AMBULATORY_CARE_PROVIDER_SITE_OTHER): Payer: Medicare Other | Admitting: Cardiology

## 2015-03-09 ENCOUNTER — Encounter: Payer: Self-pay | Admitting: Cardiology

## 2015-03-09 ENCOUNTER — Other Ambulatory Visit (INDEPENDENT_AMBULATORY_CARE_PROVIDER_SITE_OTHER): Payer: Medicare Other | Admitting: *Deleted

## 2015-03-09 VITALS — BP 118/70 | HR 51 | Ht 70.5 in | Wt 181.8 lb

## 2015-03-09 DIAGNOSIS — I472 Ventricular tachycardia, unspecified: Secondary | ICD-10-CM

## 2015-03-09 DIAGNOSIS — I259 Chronic ischemic heart disease, unspecified: Secondary | ICD-10-CM

## 2015-03-09 DIAGNOSIS — R7989 Other specified abnormal findings of blood chemistry: Secondary | ICD-10-CM | POA: Diagnosis not present

## 2015-03-09 DIAGNOSIS — E785 Hyperlipidemia, unspecified: Secondary | ICD-10-CM | POA: Diagnosis not present

## 2015-03-09 DIAGNOSIS — I4729 Other ventricular tachycardia: Secondary | ICD-10-CM

## 2015-03-09 DIAGNOSIS — Z23 Encounter for immunization: Secondary | ICD-10-CM | POA: Diagnosis not present

## 2015-03-09 DIAGNOSIS — R799 Abnormal finding of blood chemistry, unspecified: Secondary | ICD-10-CM | POA: Diagnosis not present

## 2015-03-09 DIAGNOSIS — I2510 Atherosclerotic heart disease of native coronary artery without angina pectoris: Secondary | ICD-10-CM

## 2015-03-09 DIAGNOSIS — I255 Ischemic cardiomyopathy: Secondary | ICD-10-CM | POA: Diagnosis not present

## 2015-03-09 DIAGNOSIS — I10 Essential (primary) hypertension: Secondary | ICD-10-CM | POA: Diagnosis not present

## 2015-03-09 LAB — T4, FREE: Free T4: 1.2 ng/dL (ref 0.80–1.80)

## 2015-03-09 LAB — T3, FREE: T3, Free: 2.1 pg/mL — ABNORMAL LOW (ref 2.3–4.2)

## 2015-03-09 LAB — TSH: TSH: 3.74 u[IU]/mL (ref 0.350–4.500)

## 2015-03-09 NOTE — Patient Instructions (Addendum)
Medication Instructions:  Decrease valsartan to 80mg  in the evening.  Labwork: TSH /free T3/free T4 today  Testing/Procedures: None today  Follow-Up: Your physician recommends that you schedule a follow-up appointment in: 3 months with Dr Aundra Dubin in the [facility].        If you need a refill on your cardiac medications before your next appointment, please call your pharmacy.

## 2015-03-10 NOTE — Progress Notes (Signed)
Patient ID: Nathan Pruitt, male   DOB: [birth year], 69 y.o.   MRN: [id number] PCP: Dr. Maryella Shivers  69 yo with history of CAD, ischemic CMP, and VT s/p ICD placement returns for followup.  He is on amiodarone.  Echo in 4/16 showed EF 40-45% with wall motion abnormalities.  He is not very active.  No chest pain.   He is still short of breath with heavier activity.  No dyspnea walking on flat ground or in stores.  No orthopnea or PND.  He has generalized fatigue.  He has "low grade dizziness" in the morning.  He notices this with standing up.  BP has been running lower with weight loss, weight is down 15 lbs since last visit.    Labs (11/10): K 4.9, creatinine 1.4, LDL 70, HDL 31, LFTs normal Labs (3/11): K 4.4, creatinine 1.3, LDL 61, HDL 35 Labs (7/11): LDL 62, HDL 23 Labs (1/12): K 4.8, LFTs normal, creatinine 1.44, LDL 69, HDL 33 Labs (7/12): LFTs normal, LDL 81, HDL 36, HCT normal, K 4.3, creatinine 1.3, BNP 116, TSH normal Labs (1/13): LDL 39, HDl 31, LFTs normal, TSH  Labs (7/13): TSH normal, LFTs normal, LDL 69, HDL 40 Labs (8/13): K 4.5, creatinine 1.6 Labs (10/13): K 4.4, creatinine 1.4 Labs (3/14): LDL 108, HDL 30, LFTs normal Labs (9/14): K 4.2, creatinine 1.6, LFTs normal, LDL 114, TSH 10.6 (elevated), free T4/free T3 normal Labs (2/15): K 4.6, creatinine 1.5, LFTs normal, TSH 7.6 (elevated), free T4 and free T3 normal Labs (3/15): K 3.6, creatinine 1.5, LFTs normal, TSH normal Labs (9/15): K 4.3, creatinine 1.6, LFTs normal, TSH normal, LDL 115, HDL 24 Labs (10/16): K 4.5, creatinine 1.5, LDL 90, HDL 35, hgb 12.8  EC:G NSR, 1st degree AVB, IVCD, old inferior MI  Allergies (verified):  No Known Drug Allergies  Past Medical History: 1. Coronary artery disease.       a.     The patient reports history of silent MI in 1993.  This was likely an inferior MI (see below).       b.     The patient reports history of 2-D echocardiogram in March        2009, showing an EF of 40%.        c.     The patient reports history of Myoview in [facility] in March 2009, which        was per his report normal.       d.     The patient presented to [facility] in 5/10 with VT and mildly elevated cardiac enzymes.         LHC (5/10):  Inferobasal dyskinesis with EF 35-40%.  There was chronic total occlusion of the         mid RCA with good collaterals.  Luminals LCA.  This did not appear to be an acute cause of the 5/10 event.  2. Hypertension.  3. Hyperlipidemia.  4. Remote tobacco abuse with 47-pack-year history, quitting in August 2009.  5. Peripheral arterial disease.       a.     Status post left-to-right fem-fem bypass performed in        [place] in March 2009.       b.     Status post redo left-to-right fem-fem bypass performed by        Dr. Scot Dock at [facility] in 2009.       c.     ABIs normal  3/15, ABIs normal 3/16.  6. Rheumatoid arthritis, on leflunomide.  7.  Ischemic cardiomyopathy:  EF 35-40% by LV-gram 5/10 with inferobasal dyskinesis.  Echo 5/10 showed EF 40% with mild LVH, no significant MR, inferobasal and posterobasal akinesis.  Echo (7/11): EF 50%, mild LVH, basal-mid inferoposterior akinesis.  Echo (7/12): EF 45-50% with basal anterolateral, basal posterior, and basal to mid inferior akinesis.  Echo (4/16): EF 40-45%, basal to mid inferolateral AK, basal inferior AK, basal to mid anterolateral HK.  8.  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  He has a dual chamber Medtronic ICD.  9.  Atrial flutter:  Status post isthmus ablation 5/10.   10.  PFTs (7/11): FVC 74%, FEV1 80%, ratio 75%, TLC 78%, DLCO 68%.  This suggests a mild restrictive defect and a mild obstructive defect.  He did have response to bronchodilator. These PFTs were significantly better than the report from Dr. Alcide Clever in [place] done prior.  He had last PFTs in 2/12 with no significant change.  11.  Anxiety 12.  Chronic cough: No relief with  change from ACEI to ARB or with trial of PPI.   Family History: Mother died in her late 62s with gastric cancer.  Father died in his late 47s with throat cancer and PVD.   He had a brother who died at 81 of an MI.   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 -47ppy hx, quit 2009.  Alcohol Use - yes-minimal Regular Exercise - yes Drug Use - no (prior)  Review of Systems        All systems reviewed and negative except as per HPI.   Current Outpatient Prescriptions  Medication Sig Dispense Refill  . amiodarone (PACERONE) 200 MG tablet Take 1 tablet (200 mg total) by mouth daily. 90 tablet 3  . aspirin EC 81 MG tablet Take 1 tablet (81 mg total) by mouth daily.    . carvedilol (COREG) 12.5 MG tablet Take 1 tablet (12.5 mg total) by mouth 2 (two) times daily with a meal. 180 tablet 3  . chlorhexidine (PERIDEX) 0.12 % solution SWISH WITH 15 ML FOR 30 SECONDS BID. DO NOT SWALLOW  3  . cyclobenzaprine (FLEXERIL) 5 MG tablet Take 5 mg by mouth 3 (three) times daily as needed for muscle spasms.    [name] leflunomide (ARAVA) 20 MG tablet Take 20 mg by mouth daily.      . pravastatin (PRAVACHOL) 80 MG tablet Take 1 tablet (80 mg total) by mouth every evening. 90 tablet 3  . torsemide (DEMADEX) 20 MG tablet 2 tablets (40mg ) daily at the same time 180 tablet 3  . traMADol (ULTRAM) 50 MG tablet Take 1 tablet by mouth as needed.  2  . valsartan (DIOVAN) 80 MG tablet Take 1 tablet (80 mg total) by mouth 2 (two) times daily. 180 tablet 3   No current facility-administered medications for this visit.    BP 118/70 mmHg  Pulse 51  Ht 5' 10.5" (1.791 m)  Wt 181 lb 12.8 oz (82.464 kg)  BMI 25.71 kg/m2 General:  Well developed, well nourished, in no acute distress. Neck:  Neck supple, no JVD. No masses, thyromegaly or abnormal cervical nodes. Lungs:  Slightly decreased breath sounds bilaterally.  Heart:  Non-displaced PMI, chest non-tender;  regular rate and rhythm, S1, S2 without murmurs, rubs or gallops. Carotid upstroke normal, no bruit. Normal abdominal aortic size, no bruits.  Femorals normal pulses, no bruits. Pedals normal pulses. No edema, no varicosities. Abdomen:  Bowel sounds positive; abdomen soft and non-tender without masses, organomegaly, or hernias noted. No hepatosplenomegaly. Extremities:  No clubbing or cyanosis. Neurologic:  Alert and oriented x 3. Psych:  Normal affect.  Assessment/Plan:  Chronic systolic CHF Patient does not appear volume overloaded on exam and has stable NYHA class II-III dyspnea.  Echo in 4/16 showed EF 40-45% (stable).   - Continue current torsemide and Coreg. - Given orthostatic symptoms, I will have him cut back valsartan to 80 mg once daily.  CAD No chest pain. Continue ASA, statin, Coreg, ARB.  HYPERLIPIDEMIA  Myalgias with Lipitor and Crestor.  Livalo tolerated but too expensive.  Now on pravastatin 40 mg daily. He had myalgias with 80 mg pravastatin.  LDL down to 90 mg daily on pravastatin 40 mg daily.  Zetia is too expensive and he does not want an injectable (Praluent or Repatha).  Check lipids in 3 months.  VENTRICULAR TACHYCARDIA  Continue amiodarone and Coreg. Also has ICD.  Check thyroid indices. He knows to have a yearly eye exam.  PAD He denies claudication.  Normal ABIs in 3/16, followed by [facility].   Followup in 3 months in CHF clinic.    Loralie Champagne 03/10/2015

## 2015-03-16 LAB — CUP PACEART REMOTE DEVICE CHECK
Battery Voltage: 2.88 V
Brady Statistic AP VS Percent: 0.56 %
Brady Statistic RA Percent Paced: 0.57 %
HIGH POWER IMPEDANCE MEASURED VALUE: 39 Ohm
HighPow Impedance: 55 Ohm
Implantable Lead Implant Date: 20100520
Implantable Lead Location: 753859
Implantable Lead Model: 5076
Lead Channel Impedance Value: 418 Ohm
Lead Channel Setting Pacing Amplitude: 2 V
Lead Channel Setting Pacing Amplitude: 2.5 V
Lead Channel Setting Pacing Pulse Width: 0.4 ms
MDC IDC LEAD IMPLANT DT: 20100520
MDC IDC LEAD LOCATION: 753860
MDC IDC MSMT LEADCHNL RA SENSING INTR AMPL: 0.875 mV
MDC IDC MSMT LEADCHNL RA SENSING INTR AMPL: 0.875 mV
MDC IDC MSMT LEADCHNL RV IMPEDANCE VALUE: 380 Ohm
MDC IDC MSMT LEADCHNL RV SENSING INTR AMPL: 10.25 mV
MDC IDC MSMT LEADCHNL RV SENSING INTR AMPL: 10.25 mV
MDC IDC SESS DTM: 20161028092610
MDC IDC SET LEADCHNL RV SENSING SENSITIVITY: 0.3 mV
MDC IDC STAT BRADY AP VP PERCENT: 0 %
MDC IDC STAT BRADY AS VP PERCENT: 0 %
MDC IDC STAT BRADY AS VS PERCENT: 99.43 %
MDC IDC STAT BRADY RV PERCENT PACED: 0 %

## 2015-03-17 ENCOUNTER — Encounter: Payer: Self-pay | Admitting: Cardiology

## 2015-04-10 ENCOUNTER — Ambulatory Visit (INDEPENDENT_AMBULATORY_CARE_PROVIDER_SITE_OTHER): Payer: Medicare Other

## 2015-04-10 DIAGNOSIS — I255 Ischemic cardiomyopathy: Secondary | ICD-10-CM

## 2015-04-10 DIAGNOSIS — Z95810 Presence of automatic (implantable) cardiac defibrillator: Secondary | ICD-10-CM

## 2015-04-10 NOTE — Progress Notes (Signed)
EPIC Encounter for ICM Monitoring  Patient Name: Nathan Pruitt is a 69 y.o. male Date: 04/10/2015 Primary Care Physican: Maryella Shivers, MD Primary Cardiologist: Aundra Dubin Electrophysiologist: Allred Dry Weight: 173 lb       In the past month, have you:  1. Gained more than 2 pounds in a day or more than 5 pounds in a week? no  2. Had changes in your medications (with verification of current medications)? no  3. Had more shortness of breath than is usual for you? no  4. Limited your activity because of shortness of breath? no  5. Not been able to sleep because of shortness of breath? no  6. Had increased swelling in your feet or ankles? no  7. Had symptoms of dehydration (dizziness, dry mouth, increased thirst, decreased urine output) no  8. Had changes in sodium restriction? no  9. Been compliant with medication? Yes   ICM trend:  04/10/2015   Follow-up plan: ICM clinic phone appointment on 04/24/2015.  Optivol thoracic impedance below baseline from 03/05/2015 to 04/10/2015 suggesting fluid retention.  He denied any HF symptoms.  He stated he has done all he can do to manage the fluid retention including following low sodium diet and drinking moderate amount of fluids.  He reported he thinks his body has a pattern for holding fluid and there is nothing more he can do at this time to change it.  He is taking Torsemide 40 mg bid and still has some dizziness.  Dizziness has improved since Dr Aundra Dubin made change to Valsartan 03/09/2015.   Recommended to continue to follow his low sodium diet and drinking moderate amount of fluids.    Advised will forward for Dr Aundra Dubin and Dr Jackalyn Lombard review and call back for any recommendations.   Last Lab results on 02/27/2015.  Creatinine 1.50, BUN 16 and Potassium 4.5.  Repeat Optivol ICM transmission 04/24/2015.  Copy of note sent to patient's primary care physician, primary cardiologist, and device following physician.  Rosalene Billings, RN,  CCM 04/10/2015 9:38 AM  Sent: Wed April 12, 2015 8:56 AM     To: Rosalene Billings, RN    Cc: Scarlette Calico, RN; Effie Berkshire, RN        Message     Will need followup in CHF office soon.     ----- Message -----          Notified patient to call for an appointment in CHF office soon.  He stated he will do so.   He reported he has been taking Tylenol for arthritis pain and asked if the med would cause fluid retention.  I stated I did not think Tylenol would cause          fluid retention but he can also ask Dr Aundra Dubin at the appointment.

## 2015-04-24 ENCOUNTER — Ambulatory Visit (INDEPENDENT_AMBULATORY_CARE_PROVIDER_SITE_OTHER): Payer: Medicare Other

## 2015-04-24 DIAGNOSIS — I255 Ischemic cardiomyopathy: Secondary | ICD-10-CM

## 2015-04-24 DIAGNOSIS — Z95810 Presence of automatic (implantable) cardiac defibrillator: Secondary | ICD-10-CM

## 2015-04-24 NOTE — Progress Notes (Signed)
Increase torsemide to 40 qam/20 qpm for 4 days then back to 20 bid

## 2015-04-24 NOTE — Progress Notes (Signed)
Try taking torsemide 60 qam/40 qpm x 3 days then back to 40 mg bid.  Needs BMET at hospital in [place] in 1 week .

## 2015-04-24 NOTE — Progress Notes (Signed)
EPIC Encounter for ICM Monitoring  Patient Name: Nathan Pruitt is a 69 y.o. male Date: 04/24/2015 Primary Care Physican: Maryella Shivers, MD Primary Cardiologist: Aundra Dubin Electrophysiologist: Allred Dry Weight: 178 lb       In the past month, have you:  1. Gained more than 2 pounds in a day or more than 5 pounds in a week? No, but has gained 4 lbs since 04/10/2015.  2. Had changes in your medications (with verification of current medications)? no  3. Had more shortness of breath than is usual for you? no  4. Limited your activity because of shortness of breath? no  5. Not been able to sleep because of shortness of breath? no  6. Had increased swelling in your feet or ankles? no  7. Had symptoms of dehydration (dizziness, dry mouth, increased thirst, decreased urine output) no  8. Had changes in sodium restriction? no  9. Been compliant with medication? Yes   ICM trend:  04/24/2015   Follow-up plan: ICM clinic phone appointment on 06/16/2015, he has appointment with Dr Aundra Dubin on 05/15/2015.  Optivol thoracic impedance and fluid index suggesting continuation of fluid retention since last ICM transmission on 04/10/2015. He reported weight gain of 4 pounds since 12/5.  Explained importance of monitoring for fluid weight gain of 2-3 lbs over night and 5 pounds with in a week. He reported eating chinese food in the last 2 weeks and advised this type of food is normally high in sodium.  He reported he thinks that retaining fluid is his normal and attempted to explain about HF and that normally there should not be any fluid retention.  He has complaints regarding my chart messages and explained how to change the notification preferences in my chart.  He confirmed he is taking Torsemide bid but unable to tolerate any higher dosages due to dizziness.    Advised will send to Dr Aundra Dubin and Dr Rayann Heman for review and if any recommendations will call him back.  Last Creatinine on 02/27/2015 was 1.5  and BUN 16  Copy of note sent to patient's primary care physician, primary cardiologist, and device following physician.  Rosalene Billings, RN, CCM 04/24/2015 9:36 AM

## 2015-04-24 NOTE — Progress Notes (Signed)
Pt advised, verbalized understanding, will mail order to pt for BMET in 1 week in [place].

## 2015-04-24 NOTE — Progress Notes (Signed)
I spoke with patient, pt states he is taking torsemide 20mg  bid not 40mg  bid.  Pt advised I will forward to Dr Aundra Dubin for review.

## 2015-05-02 DIAGNOSIS — I255 Ischemic cardiomyopathy: Secondary | ICD-10-CM | POA: Diagnosis not present

## 2015-05-02 DIAGNOSIS — E785 Hyperlipidemia, unspecified: Secondary | ICD-10-CM | POA: Diagnosis not present

## 2015-05-03 ENCOUNTER — Encounter: Payer: Self-pay | Admitting: Cardiology

## 2015-05-04 ENCOUNTER — Telehealth: Payer: Self-pay | Admitting: Nurse Practitioner

## 2015-05-04 NOTE — Telephone Encounter (Signed)
Keep torsemide dose the same for now, will repeat BMET at followup.

## 2015-05-04 NOTE — Telephone Encounter (Signed)
Called patient regarding lab results and Dr. Claris Gladden question regarding current dose of Torsemide.  Patient states he was advised by Dr. Aundra Dubin on 12/19 to increase torsemide to 60 mg for 4 days.  He resumed dose of 40 mg on 12/24.  Lab work was completed on 12/27.  I did not see documentation of these instructions in his chart; he states Lelon Frohlich called him to report this information.  He reports history of elevated BUN for the past few years; states lab work has been done at [facility].  He states he drinks seltzer water instead of filtered water.  I advised him that bottled or tap water would be better for his kidneys.  He has follow-up appointment with Dr. Aundra Dubin on 1/9.  I advised him I will call him back with Dr. Claris Gladden advice.  He verbalized understanding and agreement.

## 2015-05-04 NOTE — Telephone Encounter (Signed)
Called patient and reviewed Dr. Claris Gladden advice.  Patient verbalized understanding and agreement.

## 2015-05-12 ENCOUNTER — Ambulatory Visit (INDEPENDENT_AMBULATORY_CARE_PROVIDER_SITE_OTHER): Payer: Medicare Other

## 2015-05-12 DIAGNOSIS — Z95810 Presence of automatic (implantable) cardiac defibrillator: Secondary | ICD-10-CM

## 2015-05-12 DIAGNOSIS — I255 Ischemic cardiomyopathy: Secondary | ICD-10-CM

## 2015-05-12 NOTE — Progress Notes (Signed)
EPIC Encounter for ICM Monitoring  Patient Name: Nathan Pruitt is a 70 y.o. male Date: 05/12/2015 Primary Care Physican: Maryella Shivers, MD Primary Cardiologist: Aundra Dubin Electrophysiologist: Allred Dry Weight: 175 lbs       In the past month, have you:  1. Gained more than 2 pounds in a day or more than 5 pounds in a week? no  2. Had changes in your medications (with verification of current medications)? no  3. Had more shortness of breath than is usual for you? no  4. Limited your activity because of shortness of breath? no  5. Not been able to sleep because of shortness of breath? no  6. Had increased swelling in your feet or ankles? no  7. Had symptoms of dehydration (dizziness, dry mouth, increased thirst, decreased urine output) no  8. Had changes in sodium restriction? no  9. Been compliant with medication? Yes   ICM trend: 3 month 05/12/2015  ICM Trend:  1 year view 05/12/2015    Follow-up plan: ICM clinic phone appointment on 06/16/2015 and appointment with Dr Aundra Dubin on 05/15/2015.  Optivol impedance continues below baseline since last ICM transmission on 04/24/2015 suggesting fluid retention.  Patient denied any HF symptoms.  He stated he is feeling fine at this time.  No changes today.    Copy of note sent to patient's primary care physician, primary cardiologist, and device following physician.  Rosalene Billings, RN, CCM 05/12/2015 10:59 AM

## 2015-05-13 NOTE — Progress Notes (Signed)
Agree, he needs more torsemide.  Tends to self-treat.

## 2015-05-15 ENCOUNTER — Encounter (HOSPITAL_COMMUNITY): Payer: Medicare Other

## 2015-05-18 ENCOUNTER — Telehealth (HOSPITAL_COMMUNITY): Payer: Self-pay | Admitting: Pharmacist

## 2015-05-18 ENCOUNTER — Encounter (HOSPITAL_COMMUNITY): Payer: Self-pay

## 2015-05-18 ENCOUNTER — Ambulatory Visit (HOSPITAL_COMMUNITY)
Admission: RE | Admit: 2015-05-18 | Discharge: 2015-05-18 | Disposition: A | Discharge status: Home / Self Care | Payer: Medicare Other | Source: Ambulatory Visit | Attending: Internal Medicine | Admitting: Internal Medicine

## 2015-05-18 VITALS — BP 106/58 | HR 55 | Wt 183.2 lb

## 2015-05-18 DIAGNOSIS — I2510 Atherosclerotic heart disease of native coronary artery without angina pectoris: Secondary | ICD-10-CM | POA: Diagnosis not present

## 2015-05-18 DIAGNOSIS — I10 Essential (primary) hypertension: Secondary | ICD-10-CM

## 2015-05-18 DIAGNOSIS — I5022 Chronic systolic (congestive) heart failure: Secondary | ICD-10-CM | POA: Insufficient documentation

## 2015-05-18 DIAGNOSIS — I472 Ventricular tachycardia: Secondary | ICD-10-CM | POA: Insufficient documentation

## 2015-05-18 DIAGNOSIS — E785 Hyperlipidemia, unspecified: Secondary | ICD-10-CM | POA: Insufficient documentation

## 2015-05-18 DIAGNOSIS — I739 Peripheral vascular disease, unspecified: Secondary | ICD-10-CM | POA: Insufficient documentation

## 2015-05-18 DIAGNOSIS — I4729 Other ventricular tachycardia: Secondary | ICD-10-CM

## 2015-05-18 LAB — TSH: TSH: 3.983 u[IU]/mL (ref 0.350–4.500)

## 2015-05-18 LAB — BASIC METABOLIC PANEL
ANION GAP: 11 (ref 5–15)
BUN: 29 mg/dL — ABNORMAL HIGH (ref 6–20)
CALCIUM: 9.1 mg/dL (ref 8.9–10.3)
CO2: 25 mmol/L (ref 22–32)
Chloride: 104 mmol/L (ref 101–111)
Creatinine, Ser: 1.55 mg/dL — ABNORMAL HIGH (ref 0.61–1.24)
GFR, EST AFRICAN AMERICAN: 51 mL/min — AB (ref >60)
GFR, EST NON AFRICAN AMERICAN: 44 mL/min — AB (ref >60)
Glucose, Bld: 105 mg/dL — ABNORMAL HIGH (ref 65–99)
Potassium: 4.7 mmol/L (ref 3.5–5.1)
SODIUM: 140 mmol/L (ref 135–145)

## 2015-05-18 LAB — LIPID PANEL
CHOLESTEROL: 192 mg/dL (ref 0–200)
HDL: 33 mg/dL — ABNORMAL LOW (ref >40)
LDL CALC: 134 mg/dL — AB (ref 0–99)
Total CHOL/HDL Ratio: 5.8 RATIO
Triglycerides: 125 mg/dL (ref <150)
VLDL: 25 mg/dL (ref 0–40)

## 2015-05-18 LAB — CBC
HCT: 39.6 % (ref 39.0–52.0)
Hemoglobin: 13.2 g/dL (ref 13.0–17.0)
MCH: 31.1 pg (ref 26.0–34.0)
MCHC: 33.3 g/dL (ref 30.0–36.0)
MCV: 93.4 fL (ref 78.0–100.0)
PLATELETS: 232 10*3/uL (ref 150–400)
RBC: 4.24 MIL/uL (ref 4.22–5.81)
RDW: 14.5 % (ref 11.5–15.5)
WBC: 9 10*3/uL (ref 4.0–10.5)

## 2015-05-18 LAB — BRAIN NATRIURETIC PEPTIDE: B Natriuretic Peptide: 290.2 pg/mL — ABNORMAL HIGH (ref 0.0–100.0)

## 2015-05-18 LAB — T4, FREE: FREE T4: 0.92 ng/dL (ref 0.61–1.12)

## 2015-05-18 MED ORDER — PITAVASTATIN CALCIUM 2 MG PO TABS: 2.0000 mg | ORAL_TABLET | Freq: Every day | ORAL | Status: DC

## 2015-05-18 MED ORDER — TORSEMIDE 20 MG PO TABS: ORAL_TABLET | ORAL | Status: DC

## 2015-05-18 NOTE — Progress Notes (Signed)
Patient ID: Taiden Marchant, male   DOB: 09/16/1945, 70 y.o.   MRN: [id number] PCP: Dr. Maryella Shivers  70 yo with history of CAD, ischemic CMP, and VT s/p ICD placement returns for followup.  He is on amiodarone.  Echo in 4/16 showed EF 40-45% with wall motion abnormalities.  He is not very active.  No chest pain.   Weight is up 2 lbs again.  Still short of breath with moderate activity such as walking up inclines or hills.  Generally fatigued.  He is taking torsemide 20 mg bid.  Optivol impedance has been persistently low.  Lipids have been elevated, he has not been able to tolerate any statins, still getting myalgias just taking pravastatin 40 mg biw.  Zetia and Repatha appear to be too expensive to be feasible for him.   Optivol: Thoracic impedance decreased with fluid index > threshold since November.   Labs (11/10): K 4.9, creatinine 1.4, LDL 70, HDL 31, LFTs normal Labs (3/11): K 4.4, creatinine 1.3, LDL 61, HDL 35 Labs (7/11): LDL 62, HDL 23 Labs (1/12): K 4.8, LFTs normal, creatinine 1.44, LDL 69, HDL 33 Labs (7/12): LFTs normal, LDL 81, HDL 36, HCT normal, K 4.3, creatinine 1.3, BNP 116, TSH normal Labs (1/13): LDL 39, HDl 31, LFTs normal, TSH  Labs (7/13): TSH normal, LFTs normal, LDL 69, HDL 40 Labs (8/13): K 4.5, creatinine 1.6 Labs (10/13): K 4.4, creatinine 1.4 Labs (3/14): LDL 108, HDL 30, LFTs normal Labs (9/14): K 4.2, creatinine 1.6, LFTs normal, LDL 114, TSH 10.6 (elevated), free T4/free T3 normal Labs (2/15): K 4.6, creatinine 1.5, LFTs normal, TSH 7.6 (elevated), free T4 and free T3 normal Labs (3/15): K 3.6, creatinine 1.5, LFTs normal, TSH normal Labs (9/15): K 4.3, creatinine 1.6, LFTs normal, TSH normal, LDL 115, HDL 24 Labs (10/16): K 4.5, creatinine 1.5, LDL 90, HDL 35, hgb 12.8 Labs (11/16): Low free T3, normal TSH and free T4 Labs (12/16): K 5, creatinine 1.8, LFTs normal, TSH 8.6 (mildly elevated)  Allergies (verified):  No Known Drug Allergies  Past  Medical History: 1. Coronary artery disease.       a.     The patient reports history of silent MI in 1993.  This was likely an inferior MI (see below).       b.     The patient reports history of 2-D echocardiogram in March        2009, showing an EF of 40%.       c.     The patient reports history of Myoview in [facility] in March 2009, which        was per his report normal.       d.     The patient presented to [facility] in 5/10 with VT and mildly elevated cardiac enzymes.         LHC (5/10):  Inferobasal dyskinesis with EF 35-40%.  There was chronic total occlusion of the         mid RCA with good collaterals.  Luminals LCA.  This did not appear to be an acute cause of the 5/10 event.  2. Hypertension.  3. Hyperlipidemia: Intolerant of simvastatin, Crestor, Lipitor, pravastatin. .  4. Remote tobacco abuse with 47-pack-year history, quitting in August 2009.  5. Peripheral arterial disease.       a.     Status post left-to-right fem-fem bypass performed in        [place] in March 2009.  b.     Status post redo left-to-right fem-fem bypass performed by        Dr. Scot Dock at [facility] in 2009.       c.     ABIs normal 3/15, ABIs normal 3/16.  6. Rheumatoid arthritis, on leflunomide.  7.  Ischemic cardiomyopathy:  EF 35-40% by LV-gram 5/10 with inferobasal dyskinesis.  Echo 5/10 showed EF 40% with mild LVH, no significant MR, inferobasal and posterobasal akinesis.  Echo (7/11): EF 50%, mild LVH, basal-mid inferoposterior akinesis.  Echo (7/12): EF 45-50% with basal anterolateral, basal posterior, and basal to mid inferior akinesis.  Echo (4/16): EF 40-45%, basal to mid inferolateral AK, basal inferior AK, basal to mid anterolateral HK.  8.  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  He has a dual chamber Medtronic ICD.  9.  Atrial flutter:  Status post isthmus ablation 5/10.   10.  PFTs (7/11): FVC 74%, FEV1 80%, ratio 75%, TLC  78%, DLCO 68%.  This suggests a mild restrictive defect and a mild obstructive defect.  He did have response to bronchodilator. These PFTs were significantly better than the report from Dr. Alcide Clever in [place] done prior.  He had last PFTs in 2/12 with no significant change.  11.  Anxiety 12.  Chronic cough: No relief with change from ACEI to ARB or with trial of PPI.   Family History: Mother died in her late 51s with gastric cancer.  Father died in his late 78s with throat cancer and PVD.   He had a brother who died at 39 of an MI.   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 -47ppy hx, quit 2009.  Alcohol Use - yes-minimal Regular Exercise - yes Drug Use - no (prior)  Review of Systems        All systems reviewed and negative except as per HPI.   Current Outpatient Prescriptions  Medication Sig Dispense Refill  . amiodarone (PACERONE) 200 MG tablet Take 1 tablet (200 mg total) by mouth daily. 90 tablet 3  . aspirin EC 81 MG tablet Take 1 tablet (81 mg total) by mouth daily.    . carvedilol (COREG) 12.5 MG tablet Take 1 tablet (12.5 mg total) by mouth 2 (two) times daily with a meal. 180 tablet 3  . chlorhexidine (PERIDEX) 0.12 % solution SWISH WITH 15 ML FOR 30 SECONDS BID. DO NOT SWALLOW  3  . leflunomide (ARAVA) 20 MG tablet Take 20 mg by mouth daily.      [name] torsemide (DEMADEX) 20 MG tablet 2 tablets (40mg ) in the AM and 1 tablet (20mg ) in the PM 180 tablet 3  . valsartan (DIOVAN) 80 MG tablet Take 1 tablet (80 mg total) by mouth 2 (two) times daily. 180 tablet 3  . cyclobenzaprine (FLEXERIL) 5 MG tablet Take 5 mg by mouth 3 (three) times daily as needed for muscle spasms. Reported on 05/18/2015    . Pitavastatin Calcium (LIVALO) 2 MG TABS Take 1 tablet (2 mg total) by mouth daily. 30 tablet 3  . pravastatin (PRAVACHOL) 40 MG tablet Take 40 mg by mouth 2 (two) times a week.    . traMADol (ULTRAM) 50 MG tablet Take 1  tablet by mouth as needed. Reported on 05/18/2015  2   No current facility-administered medications for this encounter.    BP 106/58 mmHg  Pulse 55  Wt 183 lb 4 oz (83.122  kg)  SpO2 96% General:  Well developed, well nourished, in no acute distress. Neck:  Neck supple, JVP 8 cm. No masses, thyromegaly or abnormal cervical nodes. Lungs:  Slightly decreased breath sounds bilaterally.  Heart:  Non-displaced PMI, chest non-tender; regular rate and rhythm, S1, S2 without murmurs, rubs or gallops. Carotid upstroke normal, no bruit. Normal abdominal aortic size, no bruits. Femorals normal pulses, no bruits. Pedals normal pulses. No edema, no varicosities. Abdomen:  Bowel sounds positive; abdomen soft and non-tender without masses, organomegaly, or hernias noted. No hepatosplenomegaly. Extremities:  No clubbing or cyanosis. Neurologic:  Alert and oriented x 3. Psych:  Normal affect.  Assessment/Plan:  Chronic systolic CHF Patient does not appear particularly volume overloaded on exam and has stable NYHA class II-III dyspnea.  However, fatigue seems to have worsened.  Also, his Optivol readings suggest volume overload since November.  Echo in 4/16 showed EF 40-45% (stable).   - Continue Coreg 12.5 mg bid and valsartan 80 mg bid. - Increase torsemide to 40 qam/20 qpm with BMET/BNP today and repeat in 2 wks.  - I am going to have him followup for reassessment in 1 month and will get repeat echo at that time.   CAD No chest pain. Continue ASA, statin (see next section), Coreg, ARB.  HYPERLIPIDEMIA  Myalgias with Lipitor, simvastatin, and Crestor.  Livalo tolerated but too expensive.  Now on pravastatin 40 mg 2-3 times a week.  Still getting myalgias with this.  LDL still too high.  Zetia and Repatha seem to be too expensive. I am going to have our pharmacist work on either getting him approved through his insurance for [place] or getting him on [place].  Lipids today.   VENTRICULAR TACHYCARDIA   Continue amiodarone and Coreg. Also has ICD.  Check thyroid indices today, he has been borderline hypothyroid.  If any worse, should be treated.  Will get LFTs today. He knows to have a yearly eye exam.  PAD He denies claudication.  Normal ABIs in 3/16, followed by [facility].   Followup in 3 months in CHF clinic.    Loralie Champagne 05/18/2015

## 2015-05-18 NOTE — Telephone Encounter (Signed)
Contacted [employer] [employer] [facility] about copay cost of Livalo which will be $75.55/mo retail and $215.45/90 day supply through mail order since it is a tier 3 medication. Because he has to meet a deductible, his current copay would be $222.94. Per patient request, I have submitted a tier exception for this medication and should hear back within 3-5 business days. If the request is approved, Mr. Bourlier would like for [place] to send a 90 day supply of the medication to his pharmacy.   Ruta Hinds. Velva Harman, PharmD, BCPS, CPP Clinical Pharmacist Pager: [phone] Phone: [phone] 05/18/2015 2:47 PM

## 2015-05-18 NOTE — Patient Instructions (Signed)
Take Torsemide 40mg  in the morning and 20mg  in the evening.  Routine lab work today. Will notify you of abnormal results  FOLLOW UP: 1 month with and ECHO (Dr.McLean)

## 2015-05-19 ENCOUNTER — Encounter: Payer: Self-pay | Admitting: Internal Medicine

## 2015-05-19 LAB — T3, FREE: T3, Free: 2.2 pg/mL (ref 2.0–4.4)

## 2015-05-20 ENCOUNTER — Encounter (HOSPITAL_COMMUNITY): Payer: Self-pay

## 2015-05-22 ENCOUNTER — Encounter: Payer: Self-pay | Admitting: Cardiology

## 2015-05-23 ENCOUNTER — Telehealth (HOSPITAL_COMMUNITY): Payer: Self-pay | Admitting: Cardiology

## 2015-05-23 NOTE — Telephone Encounter (Signed)
rx mailed to patients home address for lipid panel  To be drawn early feb 2017

## 2015-05-23 NOTE — Telephone Encounter (Signed)
-----   Message from Larey Dresser, MD sent at 05/22/2015  2:50 PM EST ----- Please send Mr Kepner a prescription for getting his lipids drawn in early February down in [place].  He already has precription for BMET.

## 2015-05-31 DIAGNOSIS — L821 Other seborrheic keratosis: Secondary | ICD-10-CM | POA: Diagnosis not present

## 2015-05-31 DIAGNOSIS — L570 Actinic keratosis: Secondary | ICD-10-CM | POA: Diagnosis not present

## 2015-05-31 DIAGNOSIS — C4441 Basal cell carcinoma of skin of scalp and neck: Secondary | ICD-10-CM | POA: Diagnosis not present

## 2015-06-05 ENCOUNTER — Encounter: Payer: Self-pay | Admitting: Cardiology

## 2015-06-05 NOTE — Telephone Encounter (Signed)
Received denial from [employer] that Livalo is not eligible for tier exception.   Ruta Hinds. Velva Harman, PharmD, BCPS, CPP Clinical Pharmacist Pager: [phone] Phone: [phone] 06/05/2015 11:35 AM

## 2015-06-16 ENCOUNTER — Telehealth: Payer: Self-pay | Admitting: Cardiology

## 2015-06-16 ENCOUNTER — Ambulatory Visit (INDEPENDENT_AMBULATORY_CARE_PROVIDER_SITE_OTHER): Payer: Medicare Other | Admitting: *Deleted

## 2015-06-16 DIAGNOSIS — I472 Ventricular tachycardia: Secondary | ICD-10-CM

## 2015-06-16 DIAGNOSIS — I5022 Chronic systolic (congestive) heart failure: Secondary | ICD-10-CM | POA: Diagnosis not present

## 2015-06-16 DIAGNOSIS — Z95810 Presence of automatic (implantable) cardiac defibrillator: Secondary | ICD-10-CM

## 2015-06-16 NOTE — Telephone Encounter (Signed)
Spoke with pt and reminded pt of remote transmission that is due today. Pt verbalized understanding.   

## 2015-06-16 NOTE — Progress Notes (Signed)
Remote ICD transmission.   

## 2015-06-16 NOTE — Progress Notes (Signed)
EPIC Encounter for ICM Monitoring  Patient Name: Nathan Pruitt is a 70 y.o. male Date: 06/16/2015 Primary Care Physican: Maryella Shivers, MD Primary Cardiologist: Aundra Dubin Electrophysiologist: Allred  Dry Weight: 175 lbs       In the past month, have you:  1. Gained more than 2 pounds in a day or more than 5 pounds in a week? no  2. Had changes in your medications (with verification of current medications)? Yes, Torsemide increased to 40 mg am and 20 mg pm.    3. Had more shortness of breath than is usual for you? no  4. Limited your activity because of shortness of breath? no  5. Not been able to sleep because of shortness of breath? no  6. Had increased swelling in your feet or ankles? no  7. Had symptoms of dehydration (dizziness, dry mouth, increased thirst, decreased urine output) no  8. Had changes in sodium restriction? no  9. Been compliant with medication? Yes   ICM trend: 3 month view for 06/16/2015   ICM trend: 1 year view for 06/16/2015   Follow-up plan: ICM clinic phone appointment 09/04/2015 and office appointment for defib check on 08/02/2015 with Dr Rayann Heman.   Optivol thoracic impedance below reference line starting 06/09/2015 to 06/16/2015 suggesting fluid retention.  On 06/14/2015, starting to trend up toward reference line.  Patient denied any fluid symptoms.  He reported he started drinking sodas again on 06/09/2015 until 2 days ago.  Advised soda contains sodium.  Asked how much fluids he drinks daily and recommended to limit to 64 oz a day.  He thinks he has been drinking more that the limit.  He reported he will monitor fluid intake.  No changes today.   Copy of note sent to patient's primary care physician, primary cardiologist, and device following physician.  Rosalene Billings, RN, CCM 06/16/2015 12:53 PM

## 2015-06-19 ENCOUNTER — Encounter: Payer: Self-pay | Admitting: Cardiology

## 2015-06-19 DIAGNOSIS — I2510 Atherosclerotic heart disease of native coronary artery without angina pectoris: Secondary | ICD-10-CM | POA: Diagnosis not present

## 2015-06-19 DIAGNOSIS — I5022 Chronic systolic (congestive) heart failure: Secondary | ICD-10-CM | POA: Diagnosis not present

## 2015-06-19 DIAGNOSIS — D044 Carcinoma in situ of skin of scalp and neck: Secondary | ICD-10-CM | POA: Diagnosis not present

## 2015-06-21 ENCOUNTER — Encounter (HOSPITAL_COMMUNITY): Payer: Self-pay

## 2015-06-21 ENCOUNTER — Ambulatory Visit (HOSPITAL_BASED_OUTPATIENT_CLINIC_OR_DEPARTMENT_OTHER)
Admission: RE | Admit: 2015-06-21 | Discharge: 2015-06-21 | Disposition: A | Discharge status: Home / Self Care | Payer: Medicare Other | Source: Ambulatory Visit | Attending: Cardiology | Admitting: Cardiology

## 2015-06-21 ENCOUNTER — Ambulatory Visit (HOSPITAL_COMMUNITY)
Admission: RE | Admit: 2015-06-21 | Discharge: 2015-06-21 | Disposition: A | Discharge status: Home / Self Care | Payer: Medicare Other | Source: Ambulatory Visit | Attending: Cardiology | Admitting: Cardiology

## 2015-06-21 VITALS — BP 110/60 | HR 60 | Wt 180.2 lb

## 2015-06-21 DIAGNOSIS — I340 Nonrheumatic mitral (valve) insufficiency: Secondary | ICD-10-CM | POA: Diagnosis not present

## 2015-06-21 DIAGNOSIS — I5022 Chronic systolic (congestive) heart failure: Secondary | ICD-10-CM | POA: Diagnosis not present

## 2015-06-21 DIAGNOSIS — E785 Hyperlipidemia, unspecified: Secondary | ICD-10-CM | POA: Insufficient documentation

## 2015-06-21 DIAGNOSIS — I10 Essential (primary) hypertension: Secondary | ICD-10-CM | POA: Diagnosis not present

## 2015-06-21 DIAGNOSIS — I509 Heart failure, unspecified: Secondary | ICD-10-CM | POA: Diagnosis not present

## 2015-06-21 DIAGNOSIS — I2510 Atherosclerotic heart disease of native coronary artery without angina pectoris: Secondary | ICD-10-CM | POA: Diagnosis not present

## 2015-06-21 DIAGNOSIS — I110 Hypertensive heart disease with heart failure: Secondary | ICD-10-CM | POA: Diagnosis not present

## 2015-06-21 DIAGNOSIS — I472 Ventricular tachycardia: Secondary | ICD-10-CM

## 2015-06-21 DIAGNOSIS — R29898 Other symptoms and signs involving the musculoskeletal system: Secondary | ICD-10-CM | POA: Insufficient documentation

## 2015-06-21 DIAGNOSIS — I4729 Other ventricular tachycardia: Secondary | ICD-10-CM

## 2015-06-21 MED ORDER — EZETIMIBE 10 MG PO TABS: 10.0000 mg | ORAL_TABLET | Freq: Every day | ORAL | Status: DC

## 2015-06-21 MED ORDER — TORSEMIDE 20 MG PO TABS: 20.0000 mg | ORAL_TABLET | Freq: Two times a day (BID) | ORAL | Status: DC

## 2015-06-21 MED ORDER — SIMVASTATIN 20 MG PO TABS: 20.0000 mg | ORAL_TABLET | Freq: Every day | ORAL | Status: DC

## 2015-06-21 NOTE — Progress Notes (Signed)
Patient ID: Nathan Pruitt, male   DOB: [birth year], 70 y.o.   MRN: [id number] PCP: Dr. Maryella Shivers  70 yo with history of CAD, ischemic CMP, and VT s/p ICD placement returns for followup.  He is on amiodarone.  Echo today was stable compared to the past with EF 40% and regional WMAs.  He is not very active.  No chest pain.   Weight down 3 lbs.  Still short of breath with moderate activity such as walking up inclines or hills.  Generally fatigued.  He has been taking torsemide 40 qam/20 qpm (recent increase).  Most recent labs showed rise in creatinine to 2.4.  Optivol impedance had been persistently low.  We have still been trying to work out a cholesterol medication that will work financially for him and that will not cause myalgias.   ReDs vest reading today: 27  Optivol: Thoracic impedance recently increased with fluid index finally < threshold.    Labs (11/10): K 4.9, creatinine 1.4, LDL 70, HDL 31, LFTs normal Labs (3/11): K 4.4, creatinine 1.3, LDL 61, HDL 35 Labs (7/11): LDL 62, HDL 23 Labs (1/12): K 4.8, LFTs normal, creatinine 1.44, LDL 69, HDL 33 Labs (7/12): LFTs normal, LDL 81, HDL 36, HCT normal, K 4.3, creatinine 1.3, BNP 116, TSH normal Labs (1/13): LDL 39, HDl 31, LFTs normal, TSH  Labs (7/13): TSH normal, LFTs normal, LDL 69, HDL 40 Labs (8/13): K 4.5, creatinine 1.6 Labs (10/13): K 4.4, creatinine 1.4 Labs (3/14): LDL 108, HDL 30, LFTs normal Labs (9/14): K 4.2, creatinine 1.6, LFTs normal, LDL 114, TSH 10.6 (elevated), free T4/free T3 normal Labs (2/15): K 4.6, creatinine 1.5, LFTs normal, TSH 7.6 (elevated), free T4 and free T3 normal Labs (3/15): K 3.6, creatinine 1.5, LFTs normal, TSH normal Labs (9/15): K 4.3, creatinine 1.6, LFTs normal, TSH normal, LDL 115, HDL 24 Labs (10/16): K 4.5, creatinine 1.5, LDL 90, HDL 35, hgb 12.8 Labs (11/16): Low free T3, normal TSH and free T4 Labs (12/16): K 5, creatinine 1.8, LFTs normal, TSH 8.6 (mildly elevated) Labs (1/17):  Free T4/TSH/free T3 normal, LFTs normal Labs (2/17): K 4.4, creatinine 2.4, LDL 131, HDL 34  Allergies (verified):  No Known Drug Allergies  Past Medical History: 1. Coronary artery disease.       a.     The patient reports history of silent MI in 1993.  This was likely an inferior MI (see below).       b.     The patient reports history of 2-D echocardiogram in March        2009, showing an EF of 40%.       c.     The patient reports history of Myoview in [facility] in March 2009, which        was per his report normal.       d.     The patient presented to [facility] in 5/10 with VT and mildly elevated cardiac enzymes.         LHC (5/10):  Inferobasal dyskinesis with EF 35-40%.  There was chronic total occlusion of the         mid RCA with good collaterals.  Luminals LCA.  This did not appear to be an acute cause of the 5/10 event.  2. Hypertension.  3. Hyperlipidemia: Intolerant of simvastatin, Crestor, Lipitor, pravastatin. .  4. Remote tobacco abuse with 47-pack-year history, quitting in August 2009.  5. Peripheral arterial disease.  a.     Status post left-to-right fem-fem bypass performed in        [place] in March 2009.       b.     Status post redo left-to-right fem-fem bypass performed by        Dr. Scot Dock at [facility] in 2009.       c.     ABIs normal 3/15, ABIs normal 3/16.  6. Rheumatoid arthritis, on leflunomide.  7.  Ischemic cardiomyopathy:  EF 35-40% by LV-gram 5/10 with inferobasal dyskinesis.  Echo 5/10 showed EF 40% with mild LVH, no significant MR, inferobasal and posterobasal akinesis.  Echo (7/11): EF 50%, mild LVH, basal-mid inferoposterior akinesis.  Echo (7/12): EF 45-50% with basal anterolateral, basal posterior, and basal to mid inferior akinesis.  Echo (4/16): EF 40-45%, basal to mid inferolateral AK, basal inferior AK, basal to mid anterolateral HK.  Echo (2/17): EF 40% with basal to mid inferior akinesis, basal inferolateral aneurysm, mid inferolateral  akinesis, basal anterolateral hypokinesis, normal RV size and systolic function, PASP 25 mmHg.  8.  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  He has a dual chamber Medtronic ICD.  9.  Atrial flutter:  Status post isthmus ablation 5/10.   10.  PFTs (7/11): FVC 74%, FEV1 80%, ratio 75%, TLC 78%, DLCO 68%.  This suggests a mild restrictive defect and a mild obstructive defect.  He did have response to bronchodilator. These PFTs were significantly better than the report from Dr. Alcide Clever in [place] done prior.  He had last PFTs in 2/12 with no significant change.  11.  Anxiety 12.  Chronic cough: No relief with change from ACEI to ARB or with trial of PPI.  13.  CKD: Stage 3.   Family History: Mother died in her late 62s with gastric cancer.  Father died in his late 77s with throat cancer and PVD.   He had a brother who died at 36 of an MI.   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 -47ppy hx, quit 2009.  Alcohol Use - yes-minimal Regular Exercise - yes Drug Use - no (prior)  Review of Systems        All systems reviewed and negative except as per HPI.   Current Outpatient Prescriptions  Medication Sig Dispense Refill  . amiodarone (PACERONE) 200 MG tablet Take 1 tablet (200 mg total) by mouth daily. 90 tablet 3  . aspirin EC 81 MG tablet Take 1 tablet (81 mg total) by mouth daily.    . carvedilol (COREG) 12.5 MG tablet Take 1 tablet (12.5 mg total) by mouth 2 (two) times daily with a meal. 180 tablet 3  . chlorhexidine (PERIDEX) 0.12 % solution SWISH WITH 15 ML FOR 30 SECONDS BID. DO NOT SWALLOW  3  . cyclobenzaprine (FLEXERIL) 5 MG tablet Take 5 mg by mouth 3 (three) times daily as needed for muscle spasms. Reported on 05/18/2015    . leflunomide (ARAVA) 20 MG tablet Take 20 mg by mouth daily.      [name] torsemide (DEMADEX) 20 MG tablet Take 1 tablet (20 mg total) by mouth  2 (two) times daily.    . traMADol (ULTRAM) 50 MG tablet Take 1 tablet by mouth as needed. Reported on 05/18/2015  2  . valsartan (DIOVAN) 80 MG tablet Take 1 tablet (80 mg total) by mouth 2 (two) times daily. 180 tablet 3  .  ezetimibe (ZETIA) 10 MG tablet Take 1 tablet (10 mg total) by mouth daily. 90 tablet 3  . simvastatin (ZOCOR) 20 MG tablet Take 1 tablet (20 mg total) by mouth at bedtime. 90 tablet 3   No current facility-administered medications for this encounter.    BP 110/60 mmHg  Pulse 60  Wt 180 lb 4 oz (81.761 kg)  SpO2 97% General:  Well developed, well nourished, in no acute distress. Neck:  Neck supple, JVP 7 cm. No masses, thyromegaly or abnormal cervical nodes. Lungs:  Slightly decreased breath sounds bilaterally.  Heart:  Non-displaced PMI, chest non-tender; regular rate and rhythm, S1, S2 without murmurs, rubs or gallops. Carotid upstroke normal, no bruit. Normal abdominal aortic size, no bruits. Femorals normal pulses, no bruits. Pedals normal pulses. No edema, no varicosities. Abdomen:  Bowel sounds positive; abdomen soft and non-tender without masses, organomegaly, or hernias noted. No hepatosplenomegaly. Extremities:  No clubbing or cyanosis. Neurologic:  Alert and oriented x 3. Psych:  Normal affect.  Assessment/Plan:  Chronic systolic CHF Ischemic cardiomyopathy.  Medtronic ICD.  Stable echo with EF 40% and wall motion abnormalities in 2/17. Patient does not look volume overloaded and has stable NYHA class II-III dyspnea.  Optivol and ReDs vest both do not suggest fluid overload.   - Continue Coreg 12.5 mg bid and valsartan 80 mg bid. - I think that he has been over-diuresed (and I am not sure that Optivol is accurate for him). Hold torsemide x 2 days, then restart it at lower dose 20 mg bid. Repeat BMET in 1 week.  CAD No chest pain. Continue ASA, statin (see next section), Coreg, ARB.  HYPERLIPIDEMIA  Myalgias with Lipitor, simvastatin at higher dose, low  dose pravastatin, and Crestor.  Livalo tolerated but too expensive.  He does not want to use Repatha.  He thinks that he could tolerate simvastatin 20 daily, but this did not lower his LDL adequately.  Therefore, after discussions with pharmacist, we will start him on simvastatin 20 mg daily (ok with amiodarone) and Zetia 10 daily (should be affordable with his insurance).  He will get lipids/LFTs in 2 months.   VENTRICULAR TACHYCARDIA  Continue amiodarone and Coreg. Also has ICD.  Thyroid indices and LFTs normal in 1/17. He knows to have a yearly eye exam.  PAD He denies claudication.  Normal ABIs in 3/16, followed by [facility].   Followup in 2 months in CHF clinic.    Loralie Champagne 06/21/2015

## 2015-06-21 NOTE — Patient Instructions (Signed)
Hold Torsemide until Friday, then start 20 mg Twice daily   Start Simvastatin 20 mg daily  Start Zetia 10 mg daily  Labs in 1 week   Your physician recommends that you schedule a follow-up appointment in: 2 months

## 2015-06-21 NOTE — Progress Notes (Signed)
  Echocardiogram 2D Echocardiogram has been performed.  Darlina Sicilian M 06/21/2015, 9:41 AM

## 2015-06-22 ENCOUNTER — Telehealth (HOSPITAL_COMMUNITY): Payer: Self-pay | Admitting: Pharmacist

## 2015-06-22 NOTE — Telephone Encounter (Signed)
Mr. Belka has had intolerances to most statin medications but with his h/o CAD and LDL >100, it would be beneficial for him to be on whatever he can tolerate. He also has issues with cost. Wanted to try Livalo but cost with insurance would be $75.55/mo or $215.49/90 day with mail order. Zetia (brand preferred) would be $28/mo or $84/90 day mail order and simvastatin 20 mg would be $2/mo or $3/90 day mail order. He does have a deductible of $400 to meet and he is agreeable to proceeding with simvastatin 20 mg daily and Zetia 10 mg daily. Vytorin would require a PA and likely be too costly for him.   Ruta Hinds. Velva Harman, PharmD, BCPS, CPP Clinical Pharmacist Pager: [phone] Phone: [phone] 06/22/2015 9:10 AM

## 2015-06-27 ENCOUNTER — Other Ambulatory Visit (HOSPITAL_COMMUNITY): Payer: Self-pay | Admitting: Cardiology

## 2015-06-28 DIAGNOSIS — M0589 Other rheumatoid arthritis with rheumatoid factor of multiple sites: Secondary | ICD-10-CM | POA: Diagnosis not present

## 2015-06-28 DIAGNOSIS — I5022 Chronic systolic (congestive) heart failure: Secondary | ICD-10-CM | POA: Diagnosis not present

## 2015-06-30 ENCOUNTER — Encounter: Payer: Self-pay | Admitting: Internal Medicine

## 2015-07-04 ENCOUNTER — Encounter: Payer: Self-pay | Admitting: Family

## 2015-07-09 ENCOUNTER — Encounter: Payer: Self-pay | Admitting: Cardiology

## 2015-07-09 LAB — CUP PACEART REMOTE DEVICE CHECK
Brady Statistic AP VS Percent: 2.91 %
Brady Statistic AS VP Percent: 0.01 %
Brady Statistic RA Percent Paced: 2.92 %
HighPow Impedance: 39 Ohm
HighPow Impedance: 50 Ohm
Implantable Lead Implant Date: 20100520
Implantable Lead Location: 753860
Implantable Lead Model: 5076
Lead Channel Impedance Value: 380 Ohm
Lead Channel Sensing Intrinsic Amplitude: 8.625 mV
Lead Channel Setting Pacing Amplitude: 2 V
Lead Channel Setting Sensing Sensitivity: 0.3 mV
MDC IDC LEAD IMPLANT DT: 20100520
MDC IDC LEAD LOCATION: 753859
MDC IDC MSMT BATTERY VOLTAGE: 2.8 V
MDC IDC MSMT LEADCHNL RA IMPEDANCE VALUE: 418 Ohm
MDC IDC MSMT LEADCHNL RA SENSING INTR AMPL: 2.125 mV
MDC IDC MSMT LEADCHNL RA SENSING INTR AMPL: 2.125 mV
MDC IDC MSMT LEADCHNL RV SENSING INTR AMPL: 8.625 mV
MDC IDC SESS DTM: 20170210173506
MDC IDC SET LEADCHNL RV PACING AMPLITUDE: 2.5 V
MDC IDC SET LEADCHNL RV PACING PULSEWIDTH: 0.4 ms
MDC IDC STAT BRADY AP VP PERCENT: 0 %
MDC IDC STAT BRADY AS VS PERCENT: 97.07 %
MDC IDC STAT BRADY RV PERCENT PACED: 0.02 %

## 2015-07-09 NOTE — Progress Notes (Signed)
Normal remote reviewed. Optivol elevated, followed in ICM clinic.   Next follow up 08/02/15 in clinic.

## 2015-07-12 ENCOUNTER — Ambulatory Visit (INDEPENDENT_AMBULATORY_CARE_PROVIDER_SITE_OTHER): Payer: Medicare Other | Admitting: Family

## 2015-07-12 ENCOUNTER — Encounter: Payer: Self-pay | Admitting: Family

## 2015-07-12 ENCOUNTER — Ambulatory Visit (HOSPITAL_COMMUNITY)
Admission: RE | Admit: 2015-07-12 | Discharge: 2015-07-12 | Disposition: A | Discharge status: Home / Self Care | Payer: Medicare Other | Source: Ambulatory Visit | Attending: Family | Admitting: Family

## 2015-07-12 VITALS — BP 89/54 | HR 53 | Ht 70.5 in | Wt 178.0 lb

## 2015-07-12 DIAGNOSIS — I779 Disorder of arteries and arterioles, unspecified: Secondary | ICD-10-CM

## 2015-07-12 DIAGNOSIS — I509 Heart failure, unspecified: Secondary | ICD-10-CM | POA: Diagnosis not present

## 2015-07-12 DIAGNOSIS — Z87891 Personal history of nicotine dependence: Secondary | ICD-10-CM | POA: Insufficient documentation

## 2015-07-12 DIAGNOSIS — E785 Hyperlipidemia, unspecified: Secondary | ICD-10-CM | POA: Insufficient documentation

## 2015-07-12 DIAGNOSIS — Z95828 Presence of other vascular implants and grafts: Secondary | ICD-10-CM

## 2015-07-12 DIAGNOSIS — I739 Peripheral vascular disease, unspecified: Secondary | ICD-10-CM

## 2015-07-12 DIAGNOSIS — I110 Hypertensive heart disease with heart failure: Secondary | ICD-10-CM | POA: Diagnosis not present

## 2015-07-12 NOTE — Addendum Note (Signed)
Addended by: Reola Calkins on: 07/12/2015 03:11 PM   Modules accepted: Orders

## 2015-07-12 NOTE — Progress Notes (Signed)
[facility] [place] HISTORY AND PHYSICAL -PAD  History of Present Illness Nathan Pruitt is a 70 y.o. male who is s/p redo left to right fem-fem graft 12/30/07 by Dr. Scot Dock. He returns today for routine surveillance. He denies claudication symptoms, denies non healing wounds. He has right hip and knee pain with walking. He admits to not walking much, states "I'm a slug", also states his walking is limited by joint pain from his arthritis.   Has a defibrillator in place, he denies history of stroke or TIA.  He had skin cancer removed from his head in the last few weeks.  He states he is rarely light-headed.   Pt Diabetic: No Pt smoker: former smoker, quit in 2010  Pt meds include: Statin :Yes ASA: Yes Other anticoagulants/antiplatelets: no    Past Medical History  Diagnosis Date  . CAD (coronary artery disease)     hx of silent MI in 1993. likely an inferior MI. hx of 2D cardiogram in 3/09 showing EF of 40%. hx of myoview in [place] 3/09-nml. presented to [facility] 5/10 with VT and mildly elevated cardiac enzymes LHC (5/10): inferobasal dyskinesis with EF 35-40%. was chronic total occlusion of mid RCA with good collaterals. luminals LCA. this does not appear to be an acute cause of the 5/10 event  . HTN (hypertension)   . HLD (hyperlipidemia)   . Tobacco abuse     47 pack year hx; quit 8/09  . PAD (peripheral artery disease) (HCC)     s/p L-to-R fem-fem bypass performed by Dr. Scot Dock at [facility] in 2009   . Rheumatoid arthritis(714.0)     on leflunomide  . Ischemic cardiomyopathy     EF 35-40% by LV-gram 5/10 with inferobasal dyskinesis. echo 5/10 showed EF 40% w/mild LVH, no sig. MR, inferobasal and posterobasal akinesis. echo (7/11): EF 50%, mild LVH, basal-mid inferoposterior akenesis  . Ventricular tachycardia ([place])     likely scar-mediated. 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 He has duel chamber Medtronic ICD  . Atrial flutter ([place])     s/p  isthmus ablation 5/10  . Abnormal PFTs 7/11    FVC 74%, FEV1 80%, ratio 75%, TLC 78%, DLCO 68%. this suggests a mild restrictive and obstructive defect. pt did have a response to bronchodilator. these PFTs were significantly better than the report from Dr. Alcide Clever in [place] done prior.   [name] Anxiety   . Silent myocardial infarction ([place]) 1993  . CHF (congestive heart failure) ([place])   . Cancer [facility])     skin    Social History Social History  Substance Use Topics  . Smoking status: Former Smoker -- 78 years  . Smokeless tobacco: Never Used     Comment: QUIT 2009  . Alcohol Use: 0.0 oz/week    0 Standard drinks or equivalent per week     Comment: minimal     Family History Family History  Problem Relation Age of Onset  . Gastric cancer Mother   . Throat cancer Father   . Peripheral vascular disease Father   . Heart attack Brother 60    Past Surgical History  Procedure Laterality Date  . Vasectomy    . Tonsillectomy    . Femoral artery - femoral artery bypass graft  march 2009    left to right bypass, first @ [facility], second at [facility] by Dr Scot Dock  . Ablation    . Cardiac defibrillator placement    . Ep  implantable device  09/22/08    Medtronic, ICD Model Number:  [id number], ICD Serial Number: [id number]    Allergies  Allergen Reactions  . Atorvastatin Other (See Comments)    Muscle pain  . Crestor [Rosuvastatin Calcium] Other (See Comments)    Muscle pain  . Losartan Other (See Comments)    Muscle pain   . Telmisartan Other (See Comments)    Stomach ache    Current Outpatient Prescriptions  Medication Sig Dispense Refill  . amiodarone (PACERONE) 200 MG tablet Take 1 tablet (200 mg total) by mouth daily. 90 tablet 3  . aspirin EC 81 MG tablet Take 1 tablet (81 mg total) by mouth daily.    . carvedilol (COREG) 12.5 MG tablet Take 1 tablet (12.5 mg total) by mouth 2 (two) times daily with a meal. 180 tablet 3  . cyclobenzaprine (FLEXERIL) 5 MG  tablet Take 5 mg by mouth 3 (three) times daily as needed for muscle spasms. Reported on 05/18/2015    . ezetimibe (ZETIA) 10 MG tablet Take 1 tablet (10 mg total) by mouth daily. 90 tablet 3  . leflunomide (ARAVA) 20 MG tablet Take 20 mg by mouth daily.      . simvastatin (ZOCOR) 20 MG tablet Take 1 tablet (20 mg total) by mouth at bedtime. 90 tablet 3  . torsemide (DEMADEX) 20 MG tablet Take 1 tablet (20 mg total) by mouth 2 (two) times daily.    . traMADol (ULTRAM) 50 MG tablet Take 1 tablet by mouth as needed. Reported on 05/18/2015  2  . valsartan (DIOVAN) 80 MG tablet Take 1 tablet (80 mg total) by mouth 2 (two) times daily. 180 tablet 3  . chlorhexidine (PERIDEX) 0.12 % solution Reported on 07/12/2015  3   No current facility-administered medications for this visit.    ROS: See HPI for pertinent positives and negatives.   Physical Examination  Filed Vitals:   07/12/15 0919 07/12/15 0921  BP: 80/51 89/54  Pulse: 53   Height: 5' 10.5" (1.791 m)   Weight: 178 lb (80.74 kg)   SpO2: 96%     Body mass index is 25.17 kg/(m^2).  General: A&O x 3, WDWN . Gait: normal Eyes: PERRLA. Pulmonary: CTAB, respirations are non labored Cardiac: regular rhythm, no detected murmur. Defibrillator palpated left side of chest.     Carotid Bruits Left Right   Negative Negative  Aorta is not palpable. Radial pulses: 2+ palpable and =   VASCULAR EXAM: Extremities without ischemic changes  without Gangrene; without open wounds. Palpable fem-fem graft.     LE Pulses LEFT RIGHT   FEMORAL  palpable  palpable    POPLITEAL not palpable  not palpable   POSTERIOR TIBIAL  not palpable, biphasic waveform by Doppler   not palpable, biphasic waveform by Doppler     DORSALIS PEDIS  ANTERIOR TIBIAL not palpable, biphasic waveform by Doppler not palpable, biphasic waveform by Doppler    Abdomen: soft, NT, no palpable masses. Skin: no rashes, no ulcers, general pallor. Musculoskeletal: no muscle wasting or atrophy. Neurologic: A&O X 3; Appropriate Affect, sensation is normal; MOTOR FUNCTION: moving all extremities equally, motor strength 5/5 throughout. Speech is fluent/normal. CN 2-12 intact.                Non-Invasive Vascular Imaging: DATE: 07/12/2015 ABI (Date: 07/12/2015)  R: 1.1 (1.0, 07/06/14), DP: biphasic, PT: biphasic, TBI: 0.74  L: 0.97 (1.1), DP: biphasic, PT: biphasic, TBI: 0.75   ASSESSMENT: Nathan Pruitt is a  70 y.o. male who is s/p redo left to right fem-fem graft 12/30/07. He has no claudication symptoms, no signs of ischemia in his feet/legs.  ABI's remain normal, but waveforms have digressed from triphasic to biphasic; this is likely due to less walking.  I advised pt to see his PCP re his hypotension, states he is rarely light-headed.    PLAN:  I encouraged him to find a way to regularly exercise his legs such as walking in water in a pool, stationary bike. He has arthritis pain that limits his walking. Based on the patient's vascular studies and examination, pt will return to clinic in 1 year with ABI's. I advised him to notify us if he develops concerns re the circulation in his legs.   I discussed in depth with the patient the nature of atherosclerosis, and emphasized the importance of maximal medical management including strict control of blood pressure, blood glucose, and lipid levels, obtaining regular exercise, and continued cessation of smoking.  The patient is aware that without maximal medical management the underlying atherosclerotic disease process will progress, limiting the benefit of any interventions.  The patient was given information about PAD including signs, symptoms, treatment, what  symptoms should prompt the patient to seek immediate medical care, and risk reduction measures to take.  Clemon Chambers, RN, MSN, FNP-C [facility] Phone: [phone]  Clinic MD: Scot Dock  07/12/2015 9:21 AM

## 2015-07-14 ENCOUNTER — Encounter: Payer: Medicare Other | Admitting: Internal Medicine

## 2015-07-31 ENCOUNTER — Encounter: Payer: Medicare Other | Admitting: Internal Medicine

## 2015-08-02 ENCOUNTER — Encounter: Payer: Self-pay | Admitting: Internal Medicine

## 2015-08-02 ENCOUNTER — Ambulatory Visit (INDEPENDENT_AMBULATORY_CARE_PROVIDER_SITE_OTHER): Payer: Medicare Other | Admitting: Internal Medicine

## 2015-08-02 VITALS — BP 98/58 | HR 52 | Ht 70.5 in | Wt 176.0 lb

## 2015-08-02 DIAGNOSIS — I5022 Chronic systolic (congestive) heart failure: Secondary | ICD-10-CM

## 2015-08-02 DIAGNOSIS — M0589 Other rheumatoid arthritis with rheumatoid factor of multiple sites: Secondary | ICD-10-CM | POA: Diagnosis not present

## 2015-08-02 DIAGNOSIS — I779 Disorder of arteries and arterioles, unspecified: Secondary | ICD-10-CM

## 2015-08-02 DIAGNOSIS — I472 Ventricular tachycardia: Secondary | ICD-10-CM

## 2015-08-02 DIAGNOSIS — I4729 Other ventricular tachycardia: Secondary | ICD-10-CM

## 2015-08-02 DIAGNOSIS — I255 Ischemic cardiomyopathy: Secondary | ICD-10-CM | POA: Diagnosis not present

## 2015-08-02 DIAGNOSIS — Z95810 Presence of automatic (implantable) cardiac defibrillator: Secondary | ICD-10-CM | POA: Diagnosis not present

## 2015-08-02 DIAGNOSIS — M109 Gout, unspecified: Secondary | ICD-10-CM | POA: Diagnosis not present

## 2015-08-02 DIAGNOSIS — M150 Primary generalized (osteo)arthritis: Secondary | ICD-10-CM | POA: Diagnosis not present

## 2015-08-02 DIAGNOSIS — I10 Essential (primary) hypertension: Secondary | ICD-10-CM

## 2015-08-02 DIAGNOSIS — M545 Low back pain: Secondary | ICD-10-CM | POA: Diagnosis not present

## 2015-08-02 LAB — CUP PACEART INCLINIC DEVICE CHECK
Battery Voltage: 2.78 V
Brady Statistic AP VS Percent: 2.18 %
Brady Statistic AS VP Percent: 0.01 %
Brady Statistic AS VS Percent: 97.8 %
HIGH POWER IMPEDANCE MEASURED VALUE: 55 Ohm
HighPow Impedance: 40 Ohm
Implantable Lead Implant Date: 20100520
Implantable Lead Location: 753859
Implantable Lead Location: 753860
Implantable Lead Model: 6947
Lead Channel Impedance Value: 380 Ohm
Lead Channel Impedance Value: 437 Ohm
Lead Channel Pacing Threshold Amplitude: 0.5 V
Lead Channel Pacing Threshold Pulse Width: 0.4 ms
Lead Channel Sensing Intrinsic Amplitude: 2 mV
Lead Channel Sensing Intrinsic Amplitude: 8 mV
MDC IDC LEAD IMPLANT DT: 20100520
MDC IDC MSMT LEADCHNL RV PACING THRESHOLD AMPLITUDE: 1.5 V
MDC IDC MSMT LEADCHNL RV PACING THRESHOLD PULSEWIDTH: 0.4 ms
MDC IDC SESS DTM: 20170329132358
MDC IDC SET LEADCHNL RA PACING AMPLITUDE: 2 V
MDC IDC SET LEADCHNL RV PACING AMPLITUDE: 2.5 V
MDC IDC SET LEADCHNL RV PACING PULSEWIDTH: 0.4 ms
MDC IDC SET LEADCHNL RV SENSING SENSITIVITY: 0.3 mV
MDC IDC STAT BRADY AP VP PERCENT: 0 %
MDC IDC STAT BRADY RA PERCENT PACED: 2.19 %
MDC IDC STAT BRADY RV PERCENT PACED: 0.01 %

## 2015-08-02 MED ORDER — AMIODARONE HCL 200 MG PO TABS: 100.0000 mg | ORAL_TABLET | Freq: Every day | ORAL | Status: DC

## 2015-08-02 NOTE — Patient Instructions (Addendum)
Medication Instructions:  Your physician has recommended you make the following change in your medication:  1) Decrease Amiodarone to 100mg  daily   Labwork: None ordered   Testing/Procedures: None ordered   Follow-Up: Your physician wants you to follow-up in: 12 months with Dr Vallery Ridge will receive a reminder letter in the mail two months in advance. If you don't receive a letter, please call our office to schedule the follow-up appointment.  Remote monitoring is used to monitor your  ICD from home. This monitoring reduces the number of office visits required to check your device to one time per year. It allows [place] to keep an eye on the functioning of your device to ensure it is working properly. You are scheduled for a device check from home on 11/01/15. You may send your transmission at any time that day. If you have a wireless device, the transmission will be sent automatically. After your physician reviews your transmission, you will receive a postcard with your next transmission date.  ICM--monthly with Sharman Cheek, RN     Any Other Special Instructions Will Be Listed Below (If Applicable).     If you need a refill on your cardiac medications before your next appointment, please call your pharmacy.

## 2015-08-02 NOTE — Progress Notes (Signed)
PCP:  HODGES,FRANCISCO, MD Primary Cardiologist:  Dr Aundra Dubin  The patient presents today for routine electrophysiology followup.  Since last being seen in our clinic, the patient reports doing reasonably well.  He has had no VT over the past few years.  He denies any symptoms with amiodarone.  He has developed renal failure.  Today, he denies symptoms of chest pain, shortness of breath, orthopnea, PND, lower extremity edema, dizziness, presyncope, syncope, or neurologic sequela.  He has received no recent ICD shocks.  The patient feels that he is tolerating medications without difficulties and is otherwise without complaint today.   Past Medical History  Diagnosis Date  . CAD (coronary artery disease)     hx of silent MI in 1993. likely an inferior MI. hx of 2D cardiogram in 3/09 showing EF of 40%. hx of myoview in [place] 3/09-nml. presented to [facility] 5/10 with VT and mildly elevated cardiac enzymes LHC (5/10): inferobasal dyskinesis with EF 35-40%. was chronic total occlusion of mid RCA with good collaterals. luminals LCA. this does not appear to be an acute cause of the 5/10 event  . HTN (hypertension)   . HLD (hyperlipidemia)   . Tobacco abuse     47 pack year hx; quit 8/09  . PAD (peripheral artery disease) (HCC)     s/p L-to-R fem-fem bypass performed by Dr. Scot Dock at [facility] in 2009   . Rheumatoid arthritis(714.0)     on leflunomide  . Ischemic cardiomyopathy     EF 35-40% by LV-gram 5/10 with inferobasal dyskinesis. echo 5/10 showed EF 40% w/mild LVH, no sig. MR, inferobasal and posterobasal akinesis. echo (7/11): EF 50%, mild LVH, basal-mid inferoposterior akenesis  . Ventricular tachycardia ([place])     likely scar-mediated. 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 He has duel chamber Medtronic ICD  . Atrial flutter ([place])     s/p isthmus ablation 5/10  . Abnormal PFTs 7/11    FVC 74%, FEV1 80%, ratio 75%, TLC 78%, DLCO 68%. this suggests a mild restrictive and obstructive defect. pt  did have a response to bronchodilator. these PFTs were significantly better than the report from Dr. Alcide Clever in [place] done prior.   [name] Anxiety   . Silent myocardial infarction ([place]) 1993  . CHF (congestive heart failure) ([place])   . Cancer [facility])     skin   Past Surgical History  Procedure Laterality Date  . Vasectomy    . Tonsillectomy    . Femoral artery - femoral artery bypass graft  march 2009    left to right bypass, first @ [facility], second at [facility] by Dr Scot Dock  . Ablation    . Cardiac defibrillator placement    . Ep implantable device  09/22/08    Medtronic, ICD Model Number:  [id number], ICD Serial Number: [id number]    Current Outpatient Prescriptions  Medication Sig Dispense Refill  . amiodarone (PACERONE) 200 MG tablet Take 1 tablet (200 mg total) by mouth daily. 90 tablet 3  . aspirin EC 81 MG tablet Take 1 tablet (81 mg total) by mouth daily.    . carvedilol (COREG) 12.5 MG tablet Take 1 tablet (12.5 mg total) by mouth 2 (two) times daily with a meal. 180 tablet 3  . chlorhexidine (PERIDEX) 0.12 % solution Reported on 07/12/2015  3  . cyclobenzaprine (FLEXERIL) 5 MG tablet Take 5 mg by mouth 3 (three) times daily as needed for muscle spasms. Reported on 05/18/2015    . ezetimibe (  ZETIA) 10 MG tablet Take 1 tablet (10 mg total) by mouth daily. 90 tablet 3  . leflunomide (ARAVA) 20 MG tablet Take 20 mg by mouth daily.      . simvastatin (ZOCOR) 20 MG tablet Take 1 tablet (20 mg total) by mouth at bedtime. 90 tablet 3  . torsemide (DEMADEX) 20 MG tablet Take 1 tablet (20 mg total) by mouth 2 (two) times daily.    . traMADol (ULTRAM) 50 MG tablet Take 1 tablet by mouth as needed. Reported on 05/18/2015  2  . valsartan (DIOVAN) 80 MG tablet Take 1 tablet (80 mg total) by mouth 2 (two) times daily. 180 tablet 3   No current facility-administered medications for this visit.    Allergies  Allergen Reactions  . Atorvastatin Other (See Comments)    Muscle  pain  . Crestor [Rosuvastatin Calcium] Other (See Comments)    Muscle pain  . Losartan Other (See Comments)    Muscle pain   . Telmisartan Other (See Comments)    Stomach ache    Social History   Social History  . Marital Status: Single    Spouse Name: N/A  . Number of Children: N/A  . Years of Education: N/A   Occupational History  . Not on file.   Social History Main Topics  . Smoking status: Former Smoker -- 53 years  . Smokeless tobacco: Never Used     Comment: QUIT 2009  . Alcohol Use: 0.0 oz/week    0 Standard drinks or equivalent per week     Comment: minimal   . Drug Use: No     Comment: prior   . Sexual Activity: Not on file   Other Topics Concern  . Not on file   Social History Narrative   Single; gets minimal exercise; 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 originally from [place]    Physical Exam: Filed Vitals:   08/02/15 1029  BP: 98/58  Pulse: 52  Height: 5' 10.5" (1.791 m)  Weight: 176 lb (79.833 kg)    GEN- The patient is thin appearing, alert and oriented x 3 today.   Head- normocephalic, atraumatic Eyes-  Sclera clear, conjunctiva pink Ears- hearing intact Oropharynx- clear Neck- supple, no JVP Lungs- Clear to ausculation bilaterally, normal work of breathing Chest- ICD pocket is well healed Heart- Regular rate and rhythm, no murmurs, rubs or gallops, PMI not laterally displaced GI- soft, NT, ND, + BS Extremities- no clubbing, cyanosis, or edema  ICD interrogation- reviewed in detail today,  See PACEART report ekg today reveal sinus 52 bpm, PR 262 msec, IVCD, QRS 148 msec  Assessment and Plan:  1. VT Well controlled Reduce amiodarone to 100mg  daily pcp is following LFTs/TFTs per patient  2. CAD/ischemic CM Stable No change required today Sharman Cheek to continue to follow in ICM device clnic Tried to reset optivol today but was unable to do so as he has optivol 1.0 rather than 2.0  3. HTN Stable No change required  today  carelink Return in 1 year  Thompson Grayer MD, [facility] 08/02/2015 10:58 AM

## 2015-08-22 ENCOUNTER — Ambulatory Visit (HOSPITAL_COMMUNITY)
Admission: RE | Admit: 2015-08-22 | Discharge: 2015-08-22 | Disposition: A | Discharge status: Home / Self Care | Payer: Medicare Other | Source: Ambulatory Visit | Attending: Cardiology | Admitting: Cardiology

## 2015-08-22 VITALS — BP 106/62 | HR 56 | Wt 179.8 lb

## 2015-08-22 DIAGNOSIS — Z87891 Personal history of nicotine dependence: Secondary | ICD-10-CM | POA: Insufficient documentation

## 2015-08-22 DIAGNOSIS — M069 Rheumatoid arthritis, unspecified: Secondary | ICD-10-CM | POA: Insufficient documentation

## 2015-08-22 DIAGNOSIS — Z95828 Presence of other vascular implants and grafts: Secondary | ICD-10-CM | POA: Diagnosis not present

## 2015-08-22 DIAGNOSIS — E785 Hyperlipidemia, unspecified: Secondary | ICD-10-CM | POA: Diagnosis not present

## 2015-08-22 DIAGNOSIS — I252 Old myocardial infarction: Secondary | ICD-10-CM | POA: Insufficient documentation

## 2015-08-22 DIAGNOSIS — I2510 Atherosclerotic heart disease of native coronary artery without angina pectoris: Secondary | ICD-10-CM | POA: Diagnosis not present

## 2015-08-22 DIAGNOSIS — I255 Ischemic cardiomyopathy: Secondary | ICD-10-CM | POA: Diagnosis not present

## 2015-08-22 DIAGNOSIS — I110 Hypertensive heart disease with heart failure: Secondary | ICD-10-CM | POA: Diagnosis not present

## 2015-08-22 DIAGNOSIS — I5022 Chronic systolic (congestive) heart failure: Secondary | ICD-10-CM | POA: Insufficient documentation

## 2015-08-22 DIAGNOSIS — I4729 Other ventricular tachycardia: Secondary | ICD-10-CM

## 2015-08-22 DIAGNOSIS — Z7982 Long term (current) use of aspirin: Secondary | ICD-10-CM | POA: Diagnosis not present

## 2015-08-22 DIAGNOSIS — I739 Peripheral vascular disease, unspecified: Secondary | ICD-10-CM | POA: Diagnosis not present

## 2015-08-22 DIAGNOSIS — Z95810 Presence of automatic (implantable) cardiac defibrillator: Secondary | ICD-10-CM | POA: Insufficient documentation

## 2015-08-22 DIAGNOSIS — I472 Ventricular tachycardia: Secondary | ICD-10-CM

## 2015-08-22 DIAGNOSIS — Z8249 Family history of ischemic heart disease and other diseases of the circulatory system: Secondary | ICD-10-CM | POA: Insufficient documentation

## 2015-08-22 DIAGNOSIS — Z79899 Other long term (current) drug therapy: Secondary | ICD-10-CM | POA: Diagnosis not present

## 2015-08-22 LAB — BASIC METABOLIC PANEL
Anion gap: 10 (ref 5–15)
BUN: 22 mg/dL — AB (ref 6–20)
CALCIUM: 8.8 mg/dL — AB (ref 8.9–10.3)
CHLORIDE: 104 mmol/L (ref 101–111)
CO2: 23 mmol/L (ref 22–32)
CREATININE: 1.82 mg/dL — AB (ref 0.61–1.24)
GFR calc non Af Amer: 36 mL/min — ABNORMAL LOW (ref >60)
GFR, EST AFRICAN AMERICAN: 42 mL/min — AB (ref >60)
Glucose, Bld: 99 mg/dL (ref 65–99)
Potassium: 5.3 mmol/L — ABNORMAL HIGH (ref 3.5–5.1)
SODIUM: 137 mmol/L (ref 135–145)

## 2015-08-22 LAB — LIPID PANEL
CHOL/HDL RATIO: 3.3 ratio
Cholesterol: 116 mg/dL (ref 0–200)
HDL: 35 mg/dL — AB (ref >40)
LDL Cholesterol: 67 mg/dL (ref 0–99)
Triglycerides: 69 mg/dL (ref <150)
VLDL: 14 mg/dL (ref 0–40)

## 2015-08-22 NOTE — Patient Instructions (Signed)
Routine lab work today. Will notify you of abnormal results, otherwise no news is good news!  Follow up 4 months with Dr. McLean.  Do the following things EVERYDAY: 1) Weigh yourself in the morning before breakfast. Write it down and keep it in a log. 2) Take your medicines as prescribed 3) Eat low salt foods-Limit salt (sodium) to 2000 mg per day.  4) Stay as active as you can everyday 5) Limit all fluids for the day to less than 2 liters 

## 2015-08-22 NOTE — Progress Notes (Signed)
ReDS Vest 31 

## 2015-08-22 NOTE — Progress Notes (Signed)
Patient ID: Nathan Pruitt, male   DOB: [birth year], 70 y.o.   MRN: [id number] PCP: Dr. Maryella Shivers  70 yo with history of CAD, ischemic CMP, and VT s/p ICD placement returns for followup.  He is on amiodarone.  Echo in 2/17 was stable compared to the past with EF 40% and regional WMAs.  He is not very active.  No chest pain.   Weight down 1 lb.  Still short of breath with moderate activity such as walking up inclines or hills.  Generally fatigued.  Torsemide cut back to 20 mg bid with recent rise in creatinine.  Optivol thought to be inaccurate and unable to reset baseline on his device.  He is tolerating Zetia + simvastatin without problems.   ReDs vest reading today: 31  Optivol: Thoracic impedance low with fluid > threshold.    Labs (11/10): K 4.9, creatinine 1.4, LDL 70, HDL 31, LFTs normal Labs (3/11): K 4.4, creatinine 1.3, LDL 61, HDL 35 Labs (7/11): LDL 62, HDL 23 Labs (1/12): K 4.8, LFTs normal, creatinine 1.44, LDL 69, HDL 33 Labs (7/12): LFTs normal, LDL 81, HDL 36, HCT normal, K 4.3, creatinine 1.3, BNP 116, TSH normal Labs (1/13): LDL 39, HDl 31, LFTs normal, TSH  Labs (7/13): TSH normal, LFTs normal, LDL 69, HDL 40 Labs (8/13): K 4.5, creatinine 1.6 Labs (10/13): K 4.4, creatinine 1.4 Labs (3/14): LDL 108, HDL 30, LFTs normal Labs (9/14): K 4.2, creatinine 1.6, LFTs normal, LDL 114, TSH 10.6 (elevated), free T4/free T3 normal Labs (2/15): K 4.6, creatinine 1.5, LFTs normal, TSH 7.6 (elevated), free T4 and free T3 normal Labs (3/15): K 3.6, creatinine 1.5, LFTs normal, TSH normal Labs (9/15): K 4.3, creatinine 1.6, LFTs normal, TSH normal, LDL 115, HDL 24 Labs (10/16): K 4.5, creatinine 1.5, LDL 90, HDL 35, hgb 12.8 Labs (11/16): Low free T3, normal TSH and free T4 Labs (12/16): K 5, creatinine 1.8, LFTs normal, TSH 8.6 (mildly elevated) Labs (1/17): Free T4/TSH/free T3 normal, LFTs normal Labs (2/17): K 4.4, creatinine 2.4, LDL 131, HDL 34  Allergies (verified):  No  Known Drug Allergies  Past Medical History: 1. Coronary artery disease.       a.     The patient reports history of silent MI in 1993.  This was likely an inferior MI (see below).       b.     The patient reports history of 2-D echocardiogram in March        2009, showing an EF of 40%.       c.     The patient reports history of Myoview in [facility] in March 2009, which        was per his report normal.       d.     The patient presented to [facility] in 5/10 with VT and mildly elevated cardiac enzymes.         LHC (5/10):  Inferobasal dyskinesis with EF 35-40%.  There was chronic total occlusion of the         mid RCA with good collaterals.  Luminals LCA.  This did not appear to be an acute cause of the 5/10 event.  2. Hypertension.  3. Hyperlipidemia: Intolerant of simvastatin, Crestor, Lipitor, pravastatin. .  4. Remote tobacco abuse with 47-pack-year history, quitting in August 2009.  5. Peripheral arterial disease.       a.     Status post left-to-right fem-fem bypass performed in  [place] in March 2009.       b.     Status post redo left-to-right fem-fem bypass performed by        Dr. Scot Dock at [facility] in 2009.       c.     ABIs normal 3/15, ABIs normal 3/16, ABIs normal 3/17.  6. Rheumatoid arthritis, on leflunomide.  7.  Ischemic cardiomyopathy:  EF 35-40% by LV-gram 5/10 with inferobasal dyskinesis.  Echo 5/10 showed EF 40% with mild LVH, no significant MR, inferobasal and posterobasal akinesis.  Echo (7/11): EF 50%, mild LVH, basal-mid inferoposterior akinesis.  Echo (7/12): EF 45-50% with basal anterolateral, basal posterior, and basal to mid inferior akinesis.  Echo (4/16): EF 40-45%, basal to mid inferolateral AK, basal inferior AK, basal to mid anterolateral HK.  Echo (2/17): EF 40% with basal to mid inferior akinesis, basal inferolateral aneurysm, mid inferolateral akinesis, basal anterolateral hypokinesis, normal RV size and systolic function, PASP 25 mmHg.  8.   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  He has a dual chamber Medtronic ICD.  9.  Atrial flutter:  Status post isthmus ablation 5/10.   10.  PFTs (7/11): FVC 74%, FEV1 80%, ratio 75%, TLC 78%, DLCO 68%.  This suggests a mild restrictive defect and a mild obstructive defect.  He did have response to bronchodilator. These PFTs were significantly better than the report from Dr. Alcide Clever in [place] done prior.  He had last PFTs in 2/12 with no significant change.  11.  Anxiety 12.  Chronic cough: No relief with change from ACEI to ARB or with trial of PPI.  13.  CKD: Stage 3.   Family History: Mother died in her late 88s with gastric cancer.  Father died in his late 62s with throat cancer and PVD.   He had a brother who died at 72 of an MI.   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 -47ppy hx, quit 2009.  Alcohol Use - yes-minimal Regular Exercise - yes Drug Use - no (prior)  Review of Systems        All systems reviewed and negative except as per HPI.   Current Outpatient Prescriptions  Medication Sig Dispense Refill  . amiodarone (PACERONE) 200 MG tablet Take 0.5 tablets (100 mg total) by mouth daily. 90 tablet 3  . aspirin EC 81 MG tablet Take 1 tablet (81 mg total) by mouth daily.    . carvedilol (COREG) 12.5 MG tablet Take 1 tablet (12.5 mg total) by mouth 2 (two) times daily with a meal. 180 tablet 3  . chlorhexidine (PERIDEX) 0.12 % solution Reported on 07/12/2015  3  . cyclobenzaprine (FLEXERIL) 5 MG tablet Take 5 mg by mouth 3 (three) times daily as needed for muscle spasms. Reported on 05/18/2015    . ezetimibe (ZETIA) 10 MG tablet Take 1 tablet (10 mg total) by mouth daily. 90 tablet 3  . leflunomide (ARAVA) 20 MG tablet Take 20 mg by mouth daily.      . simvastatin (ZOCOR) 20 MG tablet Take 1 tablet (20 mg total) by mouth at bedtime. 90 tablet 3  . torsemide  (DEMADEX) 20 MG tablet Take 1 tablet (20 mg total) by mouth 2 (two) times daily.    . traMADol (ULTRAM) 50 MG tablet Take 1 tablet by mouth as needed for moderate pain. Reported on 05/18/2015  2  . valsartan (DIOVAN) 80 MG  tablet Take 1 tablet (80 mg total) by mouth 2 (two) times daily. 180 tablet 3   No current facility-administered medications for this encounter.    BP 106/62 mmHg  Pulse 56  Wt 179 lb 12 oz (81.534 kg)  SpO2 94% General:  Well developed, well nourished, in no acute distress. Neck:  Neck supple, JVP 7 cm. No masses, thyromegaly or abnormal cervical nodes. Lungs:  Slightly decreased breath sounds bilaterally.  Heart:  Non-displaced PMI, chest non-tender; regular rate and rhythm, S1, S2 without murmurs, rubs or gallops. Carotid upstroke normal, no bruit. Normal abdominal aortic size, no bruits. Femorals normal pulses, no bruits. Pedals normal pulses. No edema, no varicosities. Abdomen:  Bowel sounds positive; abdomen soft and non-tender without masses, organomegaly, or hernias noted. No hepatosplenomegaly. Extremities:  No clubbing or cyanosis. Neurologic:  Alert and oriented x 3. Psych:  Normal affect.  Assessment/Plan:  Chronic systolic CHF Ischemic cardiomyopathy.  Medtronic ICD.  Stable echo with EF 40% and wall motion abnormalities in 2/17. Patient does not look volume overloaded and has stable NYHA class II-III dyspnea.  ReDs vest does suggest fluid overload.  Optivol shows fluid index > threshold, but we have decided that Optivol baseline is likely not accurate for him.  - Continue Coreg 12.5 mg bid and valsartan 80 mg bid. - Continue torsemide 20 mg bid. BMET today.  CAD No chest pain. Continue ASA, statin/Zetia, Coreg, ARB.  HYPERLIPIDEMIA  Myalgias with Lipitor, simvastatin at higher dose, low dose pravastatin, and Crestor.  Livalo tolerated but too expensive.  He does not want to use Repatha.  He is on simvastatin 20 daily now (cannot increase with amiodarone  use) and Zetia 10 daily with no myalgias.  Check lipids today.   VENTRICULAR TACHYCARDIA  Continue amiodarone and Coreg. Also has ICD.  Thyroid indices and LFTs normal in 1/17. He knows to have a yearly eye exam.  PAD He denies claudication.  Normal ABIs in 3/17, followed by [facility].   He requests HCV test, which I will order with today's labs.  Followup in 4 months.     Loralie Champagne 08/22/2015

## 2015-08-22 NOTE — Progress Notes (Signed)
Advanced Heart Failure Medication Review by a Pharmacist  Does the patient  feel that his/her medications are working for him/her?  yes  Has the patient been experiencing any side effects to the medications prescribed?  no  Does the patient measure his/her own blood pressure or blood glucose at home?  no   Does the patient have any problems obtaining medications due to transportation or finances?   no  Understanding of regimen: good Understanding of indications: good Potential of compliance: good Patient understands to avoid NSAIDs. Patient understands to avoid decongestants.  Issues to address at subsequent visits: None   Pharmacist comments:  Nathan Pruitt is a pleasant 70 yo M presenting with a current medication list. He reports good compliance with his medications and states that he has been tolerating simvastatin and Zetia well. He did describe increased rhinitis since starting Zetia and there is a low incidence of sinusitis/URI reported with its use but I recommended attempting use with an OTC antihistamine to see if this would help. He did not have any other medication-related questions or concerns for me at this time.   Ruta Hinds. Velva Harman, PharmD, BCPS, CPP Clinical Pharmacist Pager: [phone] Phone: [phone] 08/22/2015 9:00 AM      Time with patient: 10 minutes Preparation and documentation time: 2 minutes Total time: 12 minutes

## 2015-08-23 ENCOUNTER — Other Ambulatory Visit: Payer: Self-pay

## 2015-08-23 DIAGNOSIS — E875 Hyperkalemia: Secondary | ICD-10-CM

## 2015-08-23 LAB — HEPATITIS C ANTIBODY: HCV Ab: <0.1 s/co ratio (ref 0.0–0.9)

## 2015-08-28 DIAGNOSIS — N3020 Other chronic cystitis without hematuria: Secondary | ICD-10-CM | POA: Diagnosis not present

## 2015-08-28 DIAGNOSIS — N402 Nodular prostate without lower urinary tract symptoms: Secondary | ICD-10-CM | POA: Diagnosis not present

## 2015-08-28 DIAGNOSIS — N401 Enlarged prostate with lower urinary tract symptoms: Secondary | ICD-10-CM | POA: Diagnosis not present

## 2015-08-28 DIAGNOSIS — R972 Elevated prostate specific antigen [PSA]: Secondary | ICD-10-CM | POA: Diagnosis not present

## 2015-08-28 DIAGNOSIS — R351 Nocturia: Secondary | ICD-10-CM | POA: Diagnosis not present

## 2015-08-29 ENCOUNTER — Encounter: Payer: Self-pay | Admitting: Internal Medicine

## 2015-08-31 ENCOUNTER — Telehealth: Payer: Self-pay | Admitting: *Deleted

## 2015-08-31 ENCOUNTER — Encounter: Payer: Self-pay | Admitting: Cardiology

## 2015-08-31 ENCOUNTER — Encounter: Payer: Self-pay | Admitting: Internal Medicine

## 2015-08-31 DIAGNOSIS — E875 Hyperkalemia: Secondary | ICD-10-CM | POA: Diagnosis not present

## 2015-08-31 NOTE — Telephone Encounter (Signed)
Called patient in regards to MyChart message.  Reviewed manual transmission.  Patient reports having felt an intermittent "fluttery" feeling for the past 2-3 days, particularly yesterday morning around 8:00am.  This feeling is associated with brief dizziness, "low-grade nausea" and a sensation of a "hot flash".  Patient reports that this is the same feeling he has had when he received therapy (shocks) for VT in the past.  Patient states "it could be my PTSD from all of those shocks".  He states that his BP has been stable--"106/something".  He believes that the fluttering sensation may be due to his decreased dose of amiodarone.    Transmission suggests 3 NSVT episodes, longest ~4 seconds.  Patient states he was asleep during the two episodes on 08/27/15 and that he did not notice the episode on 08/30/15 at 8:00pm.  No other arrhythmia episodes noted by device.  Advised patient that I will review his symptoms and his manual transmission with Dr. Rayann Heman tomorrow, 08/31/15, when he is back in the office.  Patient is agreeable to this plan and is appreciative of call.   Reviewed auditory alert for ERI with patient and played example tone.  Patient verbalizes understanding of shock and auditory alert plan.  Patient denies additional device-related questions or concerns at this time.

## 2015-09-01 NOTE — Telephone Encounter (Signed)
Called patient with recommendations from Dr. Rayann Heman.  Per Dr. Rayann Heman, patient can increase his amiodarone dose back to 200mg  daily as long as he understands the risks associated with it.  Patient states that the "fluttery" feeling has subsided and that for right now, he wishes to stay on 100mg  daily.  Patient agrees to call us or send a MyChart message if he feels that he needs to go to 200mg  so that his medication list can be updated.  Advised patient to call with worsening symptoms, questions, or concerns and he verbalizes understanding.  He is appreciative of call and denies questions or concerns at this time.

## 2015-09-04 ENCOUNTER — Ambulatory Visit (INDEPENDENT_AMBULATORY_CARE_PROVIDER_SITE_OTHER): Payer: Medicare Other

## 2015-09-04 DIAGNOSIS — I5022 Chronic systolic (congestive) heart failure: Secondary | ICD-10-CM

## 2015-09-04 DIAGNOSIS — Z95810 Presence of automatic (implantable) cardiac defibrillator: Secondary | ICD-10-CM

## 2015-09-04 NOTE — Progress Notes (Signed)
EPIC Encounter for ICM Monitoring  Patient Name: Nathan Pruitt is a 70 y.o. male Date: 09/04/2015 Primary Care Physican: Maryella Shivers, MD Primary Cardiologist: Aundra Dubin Electrophysiologist: Allred Dry Weight: unknown   In the past month, have you:  1. Gained more than 2 pounds in a day or more than 5 pounds in a week? No  2. Had changes in your medications (with verification of current medications)? No  3. Had more shortness of breath than is usual for you? No  4. Limited your activity because of shortness of breath? No  5. Not been able to sleep because of shortness of breath? No  6. Had increased swelling in your feet or ankles? No  7. Had symptoms of dehydration (dizziness, dry mouth, increased thirst, decreased urine output) No  8. Had changes in sodium restriction? No  9. Been compliant with medication? Yes   ICM trend: 3 month view for 09/04/2015  ICM trend: 1 year view for 09/04/2015   Follow-up plan: No further ICM clinic phone appointment due to Dr Rayann Heman and Dr Aundra Dubin have determined Optivol baseline is not accurate for him.  Dr Rayann Heman reported at last office visit on 08/02/2015 that patient will not be treated in reference to [facility] and for fluid symptoms only.   Dr Aundra Dubin checks fluid level in office by using ReDs vest.   No further ICM follow will be done.  Advised patient his device will be checked every 3 months and if he feels he has any problems to call the office to speak with the device RN.      Rosalene Billings, RN, CCM 09/04/2015 9:30 AM

## 2015-09-29 DIAGNOSIS — Z79899 Other long term (current) drug therapy: Secondary | ICD-10-CM | POA: Diagnosis not present

## 2015-09-29 DIAGNOSIS — M0579 Rheumatoid arthritis with rheumatoid factor of multiple sites without organ or systems involvement: Secondary | ICD-10-CM | POA: Diagnosis not present

## 2015-09-29 DIAGNOSIS — H25813 Combined forms of age-related cataract, bilateral: Secondary | ICD-10-CM | POA: Diagnosis not present

## 2015-10-13 ENCOUNTER — Telehealth (HOSPITAL_COMMUNITY): Payer: Self-pay | Admitting: Surgery

## 2015-10-13 NOTE — Telephone Encounter (Signed)
Patient called to request an appt.  I have made a f/u appt in August --on Aug 14th at 0840.  He is aware and agreeable.

## 2015-10-21 ENCOUNTER — Other Ambulatory Visit (HOSPITAL_COMMUNITY): Payer: Self-pay | Admitting: Cardiology

## 2015-10-24 ENCOUNTER — Other Ambulatory Visit (HOSPITAL_COMMUNITY): Payer: Self-pay | Admitting: *Deleted

## 2015-10-24 MED ORDER — AMIODARONE HCL 100 MG PO TABS: 100.0000 mg | ORAL_TABLET | Freq: Every day | ORAL | Status: DC

## 2015-10-24 MED ORDER — AMIODARONE HCL 200 MG PO TABS
100.0000 mg | ORAL_TABLET | Freq: Every day | ORAL | Status: DC
Start: 2015-10-24 — End: 2016-06-19

## 2015-10-24 MED ORDER — VALSARTAN 80 MG PO TABS: 80.0000 mg | ORAL_TABLET | Freq: Two times a day (BID) | ORAL | Status: DC

## 2015-10-24 NOTE — Telephone Encounter (Signed)
Per pharmacy, Insurance will not cover 100 mg tabs of Amio, sent in new rx for 200 mg take 1/2 tab daily, pt aware

## 2015-11-01 ENCOUNTER — Ambulatory Visit (INDEPENDENT_AMBULATORY_CARE_PROVIDER_SITE_OTHER): Payer: Medicare Other | Admitting: *Deleted

## 2015-11-01 DIAGNOSIS — I255 Ischemic cardiomyopathy: Secondary | ICD-10-CM | POA: Diagnosis not present

## 2015-11-01 DIAGNOSIS — Z95810 Presence of automatic (implantable) cardiac defibrillator: Secondary | ICD-10-CM | POA: Diagnosis not present

## 2015-11-01 NOTE — Progress Notes (Signed)
Remote ICD transmission.   

## 2015-11-02 LAB — CUP PACEART REMOTE DEVICE CHECK
Brady Statistic AP VP Percent: 0.01 %
Brady Statistic AP VS Percent: 3.07 %
Brady Statistic AS VP Percent: 0.01 %
Brady Statistic AS VS Percent: 96.91 %
Brady Statistic RA Percent Paced: 3.08 %
Brady Statistic RV Percent Paced: 0.02 %
HIGH POWER IMPEDANCE MEASURED VALUE: 39 Ohm
HIGH POWER IMPEDANCE MEASURED VALUE: 49 Ohm
Implantable Lead Implant Date: 20100520
Implantable Lead Location: 753859
Implantable Lead Location: 753860
Implantable Lead Model: 5076
Lead Channel Impedance Value: 342 Ohm
Lead Channel Impedance Value: 380 Ohm
Lead Channel Sensing Intrinsic Amplitude: 2.375 mV
Lead Channel Sensing Intrinsic Amplitude: 8.125 mV
Lead Channel Sensing Intrinsic Amplitude: 8.125 mV
MDC IDC LEAD IMPLANT DT: 20100520
MDC IDC MSMT BATTERY VOLTAGE: 2.71 V
MDC IDC MSMT LEADCHNL RA SENSING INTR AMPL: 2.375 mV
MDC IDC SESS DTM: 20170628041708
MDC IDC SET LEADCHNL RA PACING AMPLITUDE: 2 V
MDC IDC SET LEADCHNL RV PACING AMPLITUDE: 2.5 V
MDC IDC SET LEADCHNL RV PACING PULSEWIDTH: 0.4 ms
MDC IDC SET LEADCHNL RV SENSING SENSITIVITY: 0.3 mV

## 2015-11-03 ENCOUNTER — Encounter: Payer: Self-pay | Admitting: Cardiology

## 2015-11-13 DIAGNOSIS — L570 Actinic keratosis: Secondary | ICD-10-CM | POA: Diagnosis not present

## 2015-11-13 DIAGNOSIS — D1801 Hemangioma of skin and subcutaneous tissue: Secondary | ICD-10-CM | POA: Diagnosis not present

## 2015-12-18 ENCOUNTER — Ambulatory Visit (HOSPITAL_COMMUNITY)
Admission: RE | Admit: 2015-12-18 | Discharge: 2015-12-18 | Disposition: A | Discharge status: Home / Self Care | Payer: Medicare Other | Source: Ambulatory Visit | Attending: Cardiology | Admitting: Cardiology

## 2015-12-18 ENCOUNTER — Encounter (HOSPITAL_COMMUNITY): Payer: Self-pay

## 2015-12-18 VITALS — BP 104/64 | HR 52 | Wt 179.0 lb

## 2015-12-18 DIAGNOSIS — Z87891 Personal history of nicotine dependence: Secondary | ICD-10-CM | POA: Diagnosis not present

## 2015-12-18 DIAGNOSIS — Z8249 Family history of ischemic heart disease and other diseases of the circulatory system: Secondary | ICD-10-CM | POA: Insufficient documentation

## 2015-12-18 DIAGNOSIS — I255 Ischemic cardiomyopathy: Secondary | ICD-10-CM | POA: Insufficient documentation

## 2015-12-18 DIAGNOSIS — I5022 Chronic systolic (congestive) heart failure: Secondary | ICD-10-CM

## 2015-12-18 DIAGNOSIS — E785 Hyperlipidemia, unspecified: Secondary | ICD-10-CM | POA: Diagnosis not present

## 2015-12-18 DIAGNOSIS — N183 Chronic kidney disease, stage 3 (moderate): Secondary | ICD-10-CM | POA: Diagnosis not present

## 2015-12-18 DIAGNOSIS — I10 Essential (primary) hypertension: Secondary | ICD-10-CM | POA: Diagnosis not present

## 2015-12-18 DIAGNOSIS — Z7982 Long term (current) use of aspirin: Secondary | ICD-10-CM | POA: Insufficient documentation

## 2015-12-18 DIAGNOSIS — I2510 Atherosclerotic heart disease of native coronary artery without angina pectoris: Secondary | ICD-10-CM

## 2015-12-18 DIAGNOSIS — Z95810 Presence of automatic (implantable) cardiac defibrillator: Secondary | ICD-10-CM | POA: Insufficient documentation

## 2015-12-18 DIAGNOSIS — I130 Hypertensive heart and chronic kidney disease with heart failure and stage 1 through stage 4 chronic kidney disease, or unspecified chronic kidney disease: Secondary | ICD-10-CM | POA: Insufficient documentation

## 2015-12-18 DIAGNOSIS — Z79899 Other long term (current) drug therapy: Secondary | ICD-10-CM | POA: Insufficient documentation

## 2015-12-18 DIAGNOSIS — I739 Peripheral vascular disease, unspecified: Secondary | ICD-10-CM

## 2015-12-18 DIAGNOSIS — M069 Rheumatoid arthritis, unspecified: Secondary | ICD-10-CM | POA: Insufficient documentation

## 2015-12-18 LAB — COMPREHENSIVE METABOLIC PANEL
ALBUMIN: 3.6 g/dL (ref 3.5–5.0)
ALK PHOS: 91 U/L (ref 38–126)
ALT: 17 U/L (ref 17–63)
AST: 20 U/L (ref 15–41)
Anion gap: 7 (ref 5–15)
BILIRUBIN TOTAL: 0.6 mg/dL (ref 0.3–1.2)
BUN: 34 mg/dL — AB (ref 6–20)
CO2: 24 mmol/L (ref 22–32)
CREATININE: 1.69 mg/dL — AB (ref 0.61–1.24)
Calcium: 8.5 mg/dL — ABNORMAL LOW (ref 8.9–10.3)
Chloride: 105 mmol/L (ref 101–111)
GFR calc Af Amer: 46 mL/min — ABNORMAL LOW (ref >60)
GFR calc non Af Amer: 39 mL/min — ABNORMAL LOW (ref >60)
GLUCOSE: 106 mg/dL — AB (ref 65–99)
POTASSIUM: 4.8 mmol/L (ref 3.5–5.1)
Sodium: 136 mmol/L (ref 135–145)
TOTAL PROTEIN: 6.4 g/dL — AB (ref 6.5–8.1)

## 2015-12-18 LAB — TSH: TSH: 6.452 u[IU]/mL — ABNORMAL HIGH (ref 0.350–4.500)

## 2015-12-18 MED ORDER — SPIRONOLACTONE 25 MG PO TABS: 12.5000 mg | ORAL_TABLET | Freq: Every day | ORAL | 3 refills | Status: DC

## 2015-12-18 NOTE — Progress Notes (Signed)
Patient ID: Nathan Pruitt, male   DOB: 28-Aug-1945, 70 y.o.   MRN: [id number]     Advanced Heart Failure Clinic Note   PCP: Dr. Maryella Shivers  70 yo with history of CAD, ischemic CMP, and VT s/p ICD placement.  Echo in 2/17 was stable compared to the past with EF 40% and regional WMAs.  He presents today for regular follow up. Remains on low dose amiodarone. Weight stable from last visit.  Overall feels OK. He does not exercise. Most activity he does is a weekly trip to [employer].  Weight at home stable around 170-174. Denies CP. In the doughnut hole for Zetia. Still does get SOB with moderate exertion. He states he does not have DOE walking around [employer]. Taking torsemide 20-40 mg alternating daily.   Optivol: Volume status stable. Active < 1 hr a day. No VT/VF alert. No AT/AF.   Labs (11/10): K 4.9, creatinine 1.4, LDL 70, HDL 31, LFTs normal Labs (3/11): K 4.4, creatinine 1.3, LDL 61, HDL 35 Labs (7/11): LDL 62, HDL 23 Labs (1/12): K 4.8, LFTs normal, creatinine 1.44, LDL 69, HDL 33 Labs (7/12): LFTs normal, LDL 81, HDL 36, HCT normal, K 4.3, creatinine 1.3, BNP 116, TSH normal Labs (1/13): LDL 39, HDl 31, LFTs normal, TSH  Labs (7/13): TSH normal, LFTs normal, LDL 69, HDL 40 Labs (8/13): K 4.5, creatinine 1.6 Labs (10/13): K 4.4, creatinine 1.4 Labs (3/14): LDL 108, HDL 30, LFTs normal Labs (9/14): K 4.2, creatinine 1.6, LFTs normal, LDL 114, TSH 10.6 (elevated), free T4/free T3 normal Labs (2/15): K 4.6, creatinine 1.5, LFTs normal, TSH 7.6 (elevated), free T4 and free T3 normal Labs (3/15): K 3.6, creatinine 1.5, LFTs normal, TSH normal Labs (9/15): K 4.3, creatinine 1.6, LFTs normal, TSH normal, LDL 115, HDL 24 Labs (10/16): K 4.5, creatinine 1.5, LDL 90, HDL 35, hgb 12.8 Labs (11/16): Low free T3, normal TSH and free T4 Labs (12/16): K 5, creatinine 1.8, LFTs normal, TSH 8.6 (mildly elevated) Labs (1/17): Free T4/TSH/free T3 normal, LFTs normal Labs (2/17): K 4.4,  creatinine 2.4, LDL 131, HDL 34 Labs (4/17): K 4.2, creatinine 1.3, LDL 67, HDL 35  Allergies (verified):  No Known Drug Allergies  Past Medical History: 1. Coronary artery disease.       a.     The patient reports history of silent MI in 1993.  This was likely an inferior MI (see below).       b.     The patient reports history of 2-D echocardiogram in March        2009, showing an EF of 40%.       c.     The patient reports history of Myoview in [facility] in March 2009, which        was per his report normal.       d.     The patient presented to [facility] in 5/10 with VT and mildly elevated cardiac enzymes.         LHC (5/10):  Inferobasal dyskinesis with EF 35-40%.  There was chronic total occlusion of the         mid RCA with good collaterals.  Luminals LCA.  This did not appear to be an acute cause of the 5/10 event.  2. Hypertension.  3. Hyperlipidemia: Intolerant of simvastatin, Crestor, Lipitor, pravastatin. .  4. Remote tobacco abuse with 47-pack-year history, quitting in August 2009.  5. Peripheral arterial disease.  a.     Status post left-to-right fem-fem bypass performed in        [place] in March 2009.       b.     Status post redo left-to-right fem-fem bypass performed by        Dr. Scot Dock at [facility] in 2009.       c.     ABIs normal 3/15, ABIs normal 3/16, ABIs normal 3/17.  6. Rheumatoid arthritis, on leflunomide.  7.  Ischemic cardiomyopathy:  EF 35-40% by LV-gram 5/10 with inferobasal dyskinesis.  Echo 5/10 showed EF 40% with mild LVH, no significant MR, inferobasal and posterobasal akinesis.  Echo (7/11): EF 50%, mild LVH, basal-mid inferoposterior akinesis.  Echo (7/12): EF 45-50% with basal anterolateral, basal posterior, and basal to mid inferior akinesis.  Echo (4/16): EF 40-45%, basal to mid inferolateral AK, basal inferior AK, basal to mid anterolateral HK.  Echo (2/17): EF 40% with basal to mid inferior akinesis, basal inferolateral aneurysm, mid  inferolateral akinesis, basal anterolateral hypokinesis, normal RV size and systolic function, PASP 25 mmHg.  8.  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  He has a dual chamber Medtronic ICD.  9.  Atrial flutter:  Status post isthmus ablation 5/10.   10.  PFTs (7/11): FVC 74%, FEV1 80%, ratio 75%, TLC 78%, DLCO 68%.  This suggests a mild restrictive defect and a mild obstructive defect.  He did have response to bronchodilator. These PFTs were significantly better than the report from Dr. Alcide Clever in [place] done prior.  He had last PFTs in 2/12 with no significant change.  11.  Anxiety 12.  Chronic cough: No relief with change from ACEI to ARB or with trial of PPI.  13.  CKD: Stage 3.   Family History: Mother died in her late 72s with gastric cancer.  Father died in his late 71s with throat cancer and PVD.   He had a brother who died at 9 of an MI.   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 -47ppy hx, quit 2009.  Alcohol Use - yes-minimal Regular Exercise - yes Drug Use - no (prior)  Review of Systems        All systems reviewed and negative except as per HPI.   Current Outpatient Prescriptions  Medication Sig Dispense Refill  . amiodarone (PACERONE) 200 MG tablet Take 0.5 tablets (100 mg total) by mouth daily. 45 tablet 3  . aspirin EC 81 MG tablet Take 1 tablet (81 mg total) by mouth daily.    . carvedilol (COREG) 12.5 MG tablet Take 1 tablet (12.5 mg total) by mouth 2 (two) times daily with a meal. 180 tablet 3  . chlorhexidine (PERIDEX) 0.12 % solution Reported on 07/12/2015  3  . ezetimibe (ZETIA) 10 MG tablet Take 1 tablet (10 mg total) by mouth daily. 90 tablet 3  . leflunomide (ARAVA) 20 MG tablet Take 20 mg by mouth daily.      . simvastatin (ZOCOR) 20 MG tablet Take 1 tablet (20 mg total) by mouth at bedtime. 90 tablet 3  . torsemide (DEMADEX) 20 MG  tablet Take 20-40 mg by mouth as directed.    . valsartan (DIOVAN) 80 MG tablet Take 1 tablet (80 mg total) by mouth 2 (two) times daily. 180 tablet 3  . cyclobenzaprine (FLEXERIL) 5 MG tablet Take 5 mg by mouth 3 (three) times daily as needed  for muscle spasms. Reported on 05/18/2015    . traMADol (ULTRAM) 50 MG tablet Take 1 tablet by mouth as needed for moderate pain. Reported on 05/18/2015  2   No current facility-administered medications for this encounter.     Vitals:   12/18/15 0843  BP: 104/64  Pulse: (!) 52  SpO2: 98%  Weight: 179 lb (81.2 kg)   Wt Readings from Last 3 Encounters:  12/18/15 179 lb (81.2 kg)  08/22/15 179 lb 12 oz (81.5 kg)  08/02/15 176 lb (79.8 kg)     General:  Well developed, well nourished, in no acute distress. Neck:  Neck supple, JVP 7 cm. No masses, thyromegaly or abnormal cervical nodes. Lungs:   CTAB, normal effort. Heart:  Non-displaced PMI, chest non-tender; RRR, S1, S2 without murmurs, rubs or gallops. Carotid upstroke normal, no bruit. Normal abdominal aortic size, no bruits. Femorals normal pulses, no bruits. Pedals normal pulses.  Abdomen: Soft, NT, ND, no HSM. No bruits or masses. +BS  Extremities:  No clubbing or cyanosis. No peripheral edema or varicosities noted.  Neurologic:  Alert and oriented x 3. Psych:  Normal affect.  Assessment/Plan:  1. Chronic systolic CHF Ischemic cardiomyopathy.  Medtronic ICD.  Stable echo with EF 40% and wall motion abnormalities in 2/17. He has stable NYHA Class II/III dyspnea.  Volume status stable by exam and Optivol.   - Continue Coreg 12.5 mg bid and valsartan 80 mg bid. - Continue torsemide 40 mg daily alternating with 20 mg daily.  Recheck BMET today.   - Add spironolactone 12.5 mg daily.  BMET 2 wks.  2. CAD Denies chest pain.  - Continue ASA 81 mg daily, Zocor 20 mg daily, Zetia 10 mg daily, and Coreg/ARB as above.   3. HLD  Stable on Zetia and simvastatin as above without myalgias. Good lipids  in 4/17.  - Currently in donut hole for Zetia, given GoodRx card today ( $319 -> ~$100).   4. Hx of VT.   Continue amiodarone 100 mg daily and BB as above. Has ICD.  - Thyroid indices and LFTs normal in 1/17. Recheck today - Knows to get a yearly eye exam 5. PAD He denies claudication.  Normal ABIs in 3/17, followed by [facility].   Needs CMET and TSH today. Followup in 4 months.   Shirley Friar, PA-C 12/18/2015   Patient seen with PA, agree with the above note.  Stable clinically, not volume overloaded on exam.  I will add spironolactone 12.5 mg daily.  CMET/TSH today (given use of amiodarone) and repeat BMET in 2 wks with addition of spironolactone.  He may followup in 4 months.   Loralie Champagne 12/18/2015

## 2015-12-18 NOTE — Patient Instructions (Signed)
Routine lab work today. Will notify you of abnormal results, otherwise no news is good news!  START Spironolactone 12.5 mg (1/2 tab) once daily.  Lab work in 2 weeks--Take Rx to preferred lab.  Follow up 4 months with Dr. Aundra Dubin.  Do the following things EVERYDAY: 1) Weigh yourself in the morning before breakfast. Write it down and keep it in a log. 2) Take your medicines as prescribed 3) Eat low salt foods-Limit salt (sodium) to 2000 mg per day.  4) Stay as active as you can everyday 5) Limit all fluids for the day to less than 2 liters

## 2015-12-19 ENCOUNTER — Other Ambulatory Visit (HOSPITAL_COMMUNITY): Payer: Self-pay | Admitting: Pharmacist

## 2015-12-19 ENCOUNTER — Telehealth (HOSPITAL_COMMUNITY): Payer: Self-pay | Admitting: *Deleted

## 2015-12-19 MED ORDER — VALSARTAN 80 MG PO TABS: 80.0000 mg | ORAL_TABLET | Freq: Two times a day (BID) | ORAL | 3 refills | Status: DC

## 2015-12-19 MED ORDER — EZETIMIBE 10 MG PO TABS: 10.0000 mg | ORAL_TABLET | Freq: Every day | ORAL | 3 refills | Status: DC

## 2015-12-19 NOTE — Telephone Encounter (Signed)
Notes Recorded by Harvie Junior, CMA on 12/19/2015 at 2:12 PM EDT Patient aware. Spoke with pt he is aware to have labs drawn at his two week lab appt in [place]. Lab order mailed to patients home.   ------  Notes Recorded by Larey Dresser, MD on 12/18/2015 at 10:39 PM EDT Creatinine stable. Mildly elevated TSH. With next labs (2 wks in [place]), would get free T3, free T4, and TSH again.    Ref Range & Units 1d ago 94mo ago 30mo ago   TSH 0.350 - 4.500 uIU/mL 6.452  3.983 3.740

## 2015-12-19 NOTE — Telephone Encounter (Signed)
Mr. Skains called and stated that the GoodRx card we gave him did not give him as good of a discount as the online GoodRx coupon. He would like his ezetimibe and valsartan sent to [facility] in [place] which seems to give him the best price since he is now in the donut hole.   Ruta Hinds. Velva Harman, PharmD, BCPS, CPP Clinical Pharmacist Pager: [phone] Phone: [phone] 12/19/2015 9:16 AM

## 2015-12-27 ENCOUNTER — Encounter: Payer: Self-pay | Admitting: Cardiology

## 2016-01-29 ENCOUNTER — Encounter: Payer: Self-pay | Admitting: Cardiology

## 2016-01-29 DIAGNOSIS — M0589 Other rheumatoid arthritis with rheumatoid factor of multiple sites: Secondary | ICD-10-CM | POA: Diagnosis not present

## 2016-01-29 DIAGNOSIS — I5022 Chronic systolic (congestive) heart failure: Secondary | ICD-10-CM | POA: Diagnosis not present

## 2016-01-31 ENCOUNTER — Ambulatory Visit (INDEPENDENT_AMBULATORY_CARE_PROVIDER_SITE_OTHER): Payer: Medicare Other | Admitting: *Deleted

## 2016-01-31 DIAGNOSIS — I255 Ischemic cardiomyopathy: Secondary | ICD-10-CM | POA: Diagnosis not present

## 2016-01-31 NOTE — Progress Notes (Signed)
Remote ICD transmission.   

## 2016-02-02 ENCOUNTER — Telehealth (HOSPITAL_COMMUNITY): Payer: Self-pay | Admitting: *Deleted

## 2016-02-02 DIAGNOSIS — M150 Primary generalized (osteo)arthritis: Secondary | ICD-10-CM | POA: Diagnosis not present

## 2016-02-02 DIAGNOSIS — M109 Gout, unspecified: Secondary | ICD-10-CM | POA: Diagnosis not present

## 2016-02-02 DIAGNOSIS — M0589 Other rheumatoid arthritis with rheumatoid factor of multiple sites: Secondary | ICD-10-CM | POA: Diagnosis not present

## 2016-02-02 DIAGNOSIS — M545 Low back pain: Secondary | ICD-10-CM | POA: Diagnosis not present

## 2016-02-02 MED ORDER — TORSEMIDE 20 MG PO TABS: 20.0000 mg | ORAL_TABLET | Freq: Every day | ORAL | 3 refills | Status: DC

## 2016-02-02 NOTE — Telephone Encounter (Signed)
Notes Recorded by Harvie Junior, CMA on 02/02/2016 at 4:45 PM EDT Patient aware. Medications updated in patients chart. ------  Notes Recorded by Larey Dresser, MD on 02/02/2016 at 4:18 PM EDT Creatinine is up. Would have him back off on torsemide to 20 mg daily (rather than alternating with 40 mg daily). Good lipids.

## 2016-02-07 ENCOUNTER — Encounter: Payer: Self-pay | Admitting: Cardiology

## 2016-02-21 ENCOUNTER — Encounter: Payer: Self-pay | Admitting: Cardiology

## 2016-02-26 DIAGNOSIS — R972 Elevated prostate specific antigen [PSA]: Secondary | ICD-10-CM | POA: Diagnosis not present

## 2016-02-26 DIAGNOSIS — N3020 Other chronic cystitis without hematuria: Secondary | ICD-10-CM | POA: Diagnosis not present

## 2016-02-26 DIAGNOSIS — H25813 Combined forms of age-related cataract, bilateral: Secondary | ICD-10-CM | POA: Diagnosis not present

## 2016-02-28 LAB — CUP PACEART REMOTE DEVICE CHECK
Battery Voltage: 2.66 V
Brady Statistic AS VS Percent: 96.27 %
Brady Statistic RV Percent Paced: 0.03 %
Date Time Interrogation Session: 20170927162122
HIGH POWER IMPEDANCE MEASURED VALUE: 41 Ohm
HIGH POWER IMPEDANCE MEASURED VALUE: 54 Ohm
Implantable Lead Implant Date: 20100520
Implantable Lead Location: 753859
Implantable Lead Location: 753860
Implantable Lead Model: 6947
Lead Channel Sensing Intrinsic Amplitude: 2 mV
Lead Channel Sensing Intrinsic Amplitude: 2 mV
Lead Channel Sensing Intrinsic Amplitude: 6.375 mV
Lead Channel Setting Pacing Amplitude: 2.5 V
Lead Channel Setting Pacing Pulse Width: 0.4 ms
MDC IDC LEAD IMPLANT DT: 20100520
MDC IDC MSMT LEADCHNL RA IMPEDANCE VALUE: 437 Ohm
MDC IDC MSMT LEADCHNL RV IMPEDANCE VALUE: 380 Ohm
MDC IDC MSMT LEADCHNL RV SENSING INTR AMPL: 6.375 mV
MDC IDC SET LEADCHNL RA PACING AMPLITUDE: 2 V
MDC IDC SET LEADCHNL RV SENSING SENSITIVITY: 0.3 mV
MDC IDC STAT BRADY AP VP PERCENT: 0.01 %
MDC IDC STAT BRADY AP VS PERCENT: 3.69 %
MDC IDC STAT BRADY AS VP PERCENT: 0.03 %
MDC IDC STAT BRADY RA PERCENT PACED: 3.7 %

## 2016-04-08 ENCOUNTER — Other Ambulatory Visit (HOSPITAL_COMMUNITY): Payer: Self-pay | Admitting: Cardiology

## 2016-04-09 DIAGNOSIS — H25811 Combined forms of age-related cataract, right eye: Secondary | ICD-10-CM | POA: Diagnosis not present

## 2016-04-10 ENCOUNTER — Other Ambulatory Visit (HOSPITAL_COMMUNITY): Payer: Self-pay | Admitting: Pharmacist

## 2016-04-10 MED ORDER — CARVEDILOL 12.5 MG PO TABS: 12.5000 mg | ORAL_TABLET | Freq: Two times a day (BID) | ORAL | 3 refills | Status: DC

## 2016-04-12 ENCOUNTER — Encounter (HOSPITAL_COMMUNITY): Payer: Self-pay | Admitting: *Deleted

## 2016-04-12 ENCOUNTER — Encounter (HOSPITAL_COMMUNITY): Payer: Self-pay

## 2016-04-12 ENCOUNTER — Ambulatory Visit (HOSPITAL_COMMUNITY)
Admission: RE | Admit: 2016-04-12 | Discharge: 2016-04-12 | Disposition: A | Discharge status: Home / Self Care | Payer: Medicare Other | Source: Ambulatory Visit | Attending: Internal Medicine | Admitting: Internal Medicine

## 2016-04-12 VITALS — BP 98/58 | HR 61 | Wt 176.5 lb

## 2016-04-12 DIAGNOSIS — E785 Hyperlipidemia, unspecified: Secondary | ICD-10-CM | POA: Insufficient documentation

## 2016-04-12 DIAGNOSIS — Z79899 Other long term (current) drug therapy: Secondary | ICD-10-CM | POA: Diagnosis not present

## 2016-04-12 DIAGNOSIS — I2510 Atherosclerotic heart disease of native coronary artery without angina pectoris: Secondary | ICD-10-CM | POA: Diagnosis not present

## 2016-04-12 DIAGNOSIS — I255 Ischemic cardiomyopathy: Secondary | ICD-10-CM | POA: Insufficient documentation

## 2016-04-12 DIAGNOSIS — I739 Peripheral vascular disease, unspecified: Secondary | ICD-10-CM | POA: Diagnosis not present

## 2016-04-12 DIAGNOSIS — I472 Ventricular tachycardia: Secondary | ICD-10-CM | POA: Insufficient documentation

## 2016-04-12 DIAGNOSIS — Z7982 Long term (current) use of aspirin: Secondary | ICD-10-CM | POA: Insufficient documentation

## 2016-04-12 DIAGNOSIS — I130 Hypertensive heart and chronic kidney disease with heart failure and stage 1 through stage 4 chronic kidney disease, or unspecified chronic kidney disease: Secondary | ICD-10-CM | POA: Insufficient documentation

## 2016-04-12 DIAGNOSIS — I5022 Chronic systolic (congestive) heart failure: Secondary | ICD-10-CM | POA: Diagnosis not present

## 2016-04-12 DIAGNOSIS — Z95810 Presence of automatic (implantable) cardiac defibrillator: Secondary | ICD-10-CM | POA: Insufficient documentation

## 2016-04-12 LAB — COMPREHENSIVE METABOLIC PANEL
ALBUMIN: 3.7 g/dL (ref 3.5–5.0)
ALK PHOS: 85 U/L (ref 38–126)
ALT: 14 U/L — ABNORMAL LOW (ref 17–63)
ANION GAP: 7 (ref 5–15)
AST: 20 U/L (ref 15–41)
BILIRUBIN TOTAL: 0.3 mg/dL (ref 0.3–1.2)
BUN: 22 mg/dL — ABNORMAL HIGH (ref 6–20)
CALCIUM: 8.9 mg/dL (ref 8.9–10.3)
CO2: 27 mmol/L (ref 22–32)
Chloride: 103 mmol/L (ref 101–111)
Creatinine, Ser: 1.68 mg/dL — ABNORMAL HIGH (ref 0.61–1.24)
GFR calc non Af Amer: 39 mL/min — ABNORMAL LOW (ref >60)
GFR, EST AFRICAN AMERICAN: 46 mL/min — AB (ref >60)
GLUCOSE: 94 mg/dL (ref 65–99)
Potassium: 4.9 mmol/L (ref 3.5–5.1)
Sodium: 137 mmol/L (ref 135–145)
TOTAL PROTEIN: 6.9 g/dL (ref 6.5–8.1)

## 2016-04-12 LAB — T4, FREE: Free T4: 0.96 ng/dL (ref 0.61–1.12)

## 2016-04-12 LAB — TSH: TSH: 3.675 u[IU]/mL (ref 0.350–4.500)

## 2016-04-12 NOTE — Patient Instructions (Addendum)
Labs today (will call for abnormal results, otherwise no news is good news)  **Flonase Nasal spray (over the counter)   Follow up in 4 Months

## 2016-04-13 LAB — T3, FREE: T3 FREE: 1.9 pg/mL — AB (ref 2.0–4.4)

## 2016-04-13 NOTE — Progress Notes (Signed)
Patient ID: Nathan Pruitt, male   DOB: February 01, 1946, 70 y.o.   MRN: [id number]     Advanced Heart Failure Clinic Note   PCP: Dr. Maryella Shivers  70 yo with history of CAD, ischemic CMP, and VT s/p ICD placement.  Echo in 2/17 was stable compared to the past with EF 40% and regional WMAs.  He presents today for regular follow up. Remains on low dose amiodarone. Weight down 3 lbs.  Overall feels OK. He does not exercise. Most activity he does is a weekly trip to [place].  No chest pain. Still does get SOB with moderate exertion. Taking torsemide 20 mg daily.  He did not tolerate spironolactone and stopped it, does not remember what his symptoms were.   Optivol: Volume status stable. No atrial fibrillation or VT noted.   Labs (11/10): K 4.9, creatinine 1.4, LDL 70, HDL 31, LFTs normal Labs (3/11): K 4.4, creatinine 1.3, LDL 61, HDL 35 Labs (7/11): LDL 62, HDL 23 Labs (1/12): K 4.8, LFTs normal, creatinine 1.44, LDL 69, HDL 33 Labs (7/12): LFTs normal, LDL 81, HDL 36, HCT normal, K 4.3, creatinine 1.3, BNP 116, TSH normal Labs (1/13): LDL 39, HDl 31, LFTs normal, TSH  Labs (7/13): TSH normal, LFTs normal, LDL 69, HDL 40 Labs (8/13): K 4.5, creatinine 1.6 Labs (10/13): K 4.4, creatinine 1.4 Labs (3/14): LDL 108, HDL 30, LFTs normal Labs (9/14): K 4.2, creatinine 1.6, LFTs normal, LDL 114, TSH 10.6 (elevated), free T4/free T3 normal Labs (2/15): K 4.6, creatinine 1.5, LFTs normal, TSH 7.6 (elevated), free T4 and free T3 normal Labs (3/15): K 3.6, creatinine 1.5, LFTs normal, TSH normal Labs (9/15): K 4.3, creatinine 1.6, LFTs normal, TSH normal, LDL 115, HDL 24 Labs (10/16): K 4.5, creatinine 1.5, LDL 90, HDL 35, hgb 12.8 Labs (11/16): Low free T3, normal TSH and free T4 Labs (12/16): K 5, creatinine 1.8, LFTs normal, TSH 8.6 (mildly elevated) Labs (1/17): Free T4/TSH/free T3 normal, LFTs normal Labs (2/17): K 4.4, creatinine 2.4, LDL 131, HDL 34 Labs (4/17): K 4.2, creatinine 1.3, LDL  67, HDL 35 Labs (9/17): K 4.9, creatinine 2.2, LDL 54, HDL 35, free T3 and T4 normal, hgb 12  Allergies (verified):  No Known Drug Allergies  Past Medical History: 1. Coronary artery disease.       a.     The patient reports history of silent MI in 1993.  This was likely an inferior MI (see below).       b.     The patient reports history of 2-D echocardiogram in March        2009, showing an EF of 40%.       c.     The patient reports history of Myoview in [facility] in March 2009, which        was per his report normal.       d.     The patient presented to [facility] in 5/10 with VT and mildly elevated cardiac enzymes.         LHC (5/10):  Inferobasal dyskinesis with EF 35-40%.  There was chronic total occlusion of the         mid RCA with good collaterals.  Luminals LCA.  This did not appear to be an acute cause of the 5/10 event.  2. Hypertension.  3. Hyperlipidemia: Intolerant of simvastatin, Crestor, Lipitor, pravastatin. .  4. Remote tobacco abuse with 47-pack-year history, quitting in August 2009.  5. Peripheral arterial  disease.       a.     Status post left-to-right fem-fem bypass performed in        [place] in March 2009.       b.     Status post redo left-to-right fem-fem bypass performed by        Dr. Scot Dock at [facility] in 2009.       c.     ABIs normal 3/15, ABIs normal 3/16, ABIs normal 3/17.  6. Rheumatoid arthritis, on leflunomide.  7.  Ischemic cardiomyopathy:  EF 35-40% by LV-gram 5/10 with inferobasal dyskinesis.  Echo 5/10 showed EF 40% with mild LVH, no significant MR, inferobasal and posterobasal akinesis.  Echo (7/11): EF 50%, mild LVH, basal-mid inferoposterior akinesis.  Echo (7/12): EF 45-50% with basal anterolateral, basal posterior, and basal to mid inferior akinesis.  Echo (4/16): EF 40-45%, basal to mid inferolateral AK, basal inferior AK, basal to mid anterolateral HK.  Echo (2/17): EF 40% with basal to mid inferior akinesis, basal inferolateral  aneurysm, mid inferolateral akinesis, basal anterolateral hypokinesis, normal RV size and systolic function, PASP 25 mmHg.  8.  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  He has a dual chamber Medtronic ICD.  9.  Atrial flutter:  Status post isthmus ablation 5/10.   10.  PFTs (7/11): FVC 74%, FEV1 80%, ratio 75%, TLC 78%, DLCO 68%.  This suggests a mild restrictive defect and a mild obstructive defect.  He did have response to bronchodilator. These PFTs were significantly better than the report from Dr. Alcide Clever in [place] done prior.  He had last PFTs in 2/12 with no significant change.  11.  Anxiety 12.  Chronic cough: No relief with change from ACEI to ARB or with trial of PPI.  13.  CKD: Stage 3.   Family History: Mother died in her late 40s with gastric cancer.  Father died in his late 63s with throat cancer and PVD.   He had a brother who died at 51 of an MI.   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 -47ppy hx, quit 2009.  Alcohol Use - yes-minimal Regular Exercise - yes Drug Use - no (prior)  Review of Systems        All systems reviewed and negative except as per HPI.   Current Outpatient Prescriptions  Medication Sig Dispense Refill  . amiodarone (PACERONE) 200 MG tablet Take 0.5 tablets (100 mg total) by mouth daily. 45 tablet 3  . aspirin EC 81 MG tablet Take 1 tablet (81 mg total) by mouth daily.    . carvedilol (COREG) 12.5 MG tablet Take 1 tablet (12.5 mg total) by mouth 2 (two) times daily with a meal. 180 tablet 3  . chlorhexidine (PERIDEX) 0.12 % solution Reported on 07/12/2015  3  . cyclobenzaprine (FLEXERIL) 5 MG tablet Take 5 mg by mouth 3 (three) times daily as needed for muscle spasms. Reported on 05/18/2015    . ezetimibe (ZETIA) 10 MG tablet Take 1 tablet (10 mg total) by mouth daily. 90 tablet 3  . leflunomide (ARAVA) 20 MG tablet Take 20 mg by  mouth daily.      . simvastatin (ZOCOR) 20 MG tablet Take 1 tablet (20 mg total) by mouth at bedtime. 90 tablet 3  . torsemide (DEMADEX) 20 MG tablet Take 1 tablet (20 mg total) by mouth daily. 90 tablet 3  . traMADol (ULTRAM)  50 MG tablet Take 1 tablet by mouth as needed for moderate pain. Reported on 05/18/2015  2  . valsartan (DIOVAN) 80 MG tablet Take 1 tablet (80 mg total) by mouth 2 (two) times daily. 180 tablet 3   No current facility-administered medications for this encounter.     Vitals:   04/12/16 0837  BP: (!) 98/58  Pulse: 61  SpO2: 98%  Weight: 176 lb 8 oz (80.1 kg)   Wt Readings from Last 3 Encounters:  04/12/16 176 lb 8 oz (80.1 kg)  12/18/15 179 lb (81.2 kg)  08/22/15 179 lb 12 oz (81.5 kg)     General:  Well developed, well nourished, in no acute distress. Neck:  Neck supple, JVP 7 cm. No masses, thyromegaly or abnormal cervical nodes. Lungs:   CTAB, normal effort. Heart:  Non-displaced PMI, chest non-tender; RRR, S1, S2 without murmurs, rubs or gallops. Carotid upstroke normal, no bruit. Normal abdominal aortic size, no bruits. Femorals normal pulses, no bruits. Pedals normal pulses.  Abdomen: Soft, NT, ND, no HSM. No bruits or masses. +BS  Extremities:  No clubbing or cyanosis. No peripheral edema or varicosities noted.  Neurologic:  Alert and oriented x 3. Psych:  Normal affect.  Assessment/Plan:  1. Chronic systolic CHF Ischemic cardiomyopathy.  Medtronic ICD.  Stable echo with EF 40% and wall motion abnormalities in 2/17. He has stable NYHA Class II/III dyspnea.  Volume status stable by exam and Optivol.   - Continue Coreg 12.5 mg bid and valsartan 80 mg bid. - Continue torsemide 20 mg daily.  Recheck BMET today.   - He is off spironolactone (had side effects, does not remember what).  2. CAD Denies chest pain.  - Continue ASA 81 mg daily, Zocor 20 mg daily, Zetia 10 mg daily, and Coreg/ARB as above.   3. HLD  Stable on Zetia and simvastatin as above  without myalgias. Good lipids in 9/17.  4. Hx of VT.   Continue amiodarone 100 mg daily and BB as above. Has ICD.  - Check LFTs and TSH today.  - Knows to get a yearly eye exam 5. PAD He denies claudication.  Normal ABIs in 3/17, followed by [facility].   Loralie Champagne 04/13/2016

## 2016-04-22 ENCOUNTER — Other Ambulatory Visit: Payer: Self-pay | Admitting: Internal Medicine

## 2016-05-01 ENCOUNTER — Ambulatory Visit (INDEPENDENT_AMBULATORY_CARE_PROVIDER_SITE_OTHER): Payer: Medicare Other | Admitting: *Deleted

## 2016-05-01 DIAGNOSIS — I255 Ischemic cardiomyopathy: Secondary | ICD-10-CM

## 2016-05-02 NOTE — Progress Notes (Signed)
Remote ICD transmission.   

## 2016-05-03 ENCOUNTER — Encounter: Payer: Self-pay | Admitting: Cardiology

## 2016-05-03 LAB — CUP PACEART REMOTE DEVICE CHECK
Brady Statistic AP VP Percent: 0.04 %
Brady Statistic AP VS Percent: 12.05 %
Brady Statistic AS VP Percent: 0.06 %
Brady Statistic AS VS Percent: 87.85 %
Brady Statistic RV Percent Paced: 0.12 %
Date Time Interrogation Session: 20171227072729
HIGH POWER IMPEDANCE MEASURED VALUE: 39 Ohm
HIGH POWER IMPEDANCE MEASURED VALUE: 51 Ohm
Implantable Lead Implant Date: 20100520
Implantable Lead Location: 753860
Implantable Lead Model: 5076
Implantable Lead Model: 6947
Lead Channel Sensing Intrinsic Amplitude: 1.75 mV
Lead Channel Sensing Intrinsic Amplitude: 1.75 mV
Lead Channel Sensing Intrinsic Amplitude: 8.125 mV
Lead Channel Sensing Intrinsic Amplitude: 8.125 mV
Lead Channel Setting Pacing Pulse Width: 0.4 ms
MDC IDC LEAD IMPLANT DT: 20100520
MDC IDC LEAD LOCATION: 753859
MDC IDC MSMT BATTERY VOLTAGE: 2.64 V
MDC IDC MSMT LEADCHNL RA IMPEDANCE VALUE: 418 Ohm
MDC IDC MSMT LEADCHNL RV IMPEDANCE VALUE: 342 Ohm
MDC IDC PG IMPLANT DT: 20100520
MDC IDC SET LEADCHNL RA PACING AMPLITUDE: 2 V
MDC IDC SET LEADCHNL RV PACING AMPLITUDE: 2.5 V
MDC IDC SET LEADCHNL RV SENSING SENSITIVITY: 0.3 mV
MDC IDC STAT BRADY RA PERCENT PACED: 11.91 %

## 2016-05-13 DIAGNOSIS — L570 Actinic keratosis: Secondary | ICD-10-CM | POA: Diagnosis not present

## 2016-05-14 DIAGNOSIS — Z95810 Presence of automatic (implantable) cardiac defibrillator: Secondary | ICD-10-CM | POA: Diagnosis not present

## 2016-05-14 DIAGNOSIS — H25811 Combined forms of age-related cataract, right eye: Secondary | ICD-10-CM | POA: Diagnosis not present

## 2016-05-14 DIAGNOSIS — J329 Chronic sinusitis, unspecified: Secondary | ICD-10-CM | POA: Diagnosis not present

## 2016-05-14 DIAGNOSIS — I10 Essential (primary) hypertension: Secondary | ICD-10-CM | POA: Diagnosis not present

## 2016-05-14 DIAGNOSIS — I252 Old myocardial infarction: Secondary | ICD-10-CM | POA: Diagnosis not present

## 2016-05-14 DIAGNOSIS — Z85828 Personal history of other malignant neoplasm of skin: Secondary | ICD-10-CM | POA: Diagnosis not present

## 2016-05-14 DIAGNOSIS — Z79899 Other long term (current) drug therapy: Secondary | ICD-10-CM | POA: Diagnosis not present

## 2016-05-14 DIAGNOSIS — I472 Ventricular tachycardia: Secondary | ICD-10-CM | POA: Diagnosis not present

## 2016-05-14 DIAGNOSIS — Z7982 Long term (current) use of aspirin: Secondary | ICD-10-CM | POA: Diagnosis not present

## 2016-05-14 DIAGNOSIS — E785 Hyperlipidemia, unspecified: Secondary | ICD-10-CM | POA: Diagnosis not present

## 2016-05-14 DIAGNOSIS — Z87891 Personal history of nicotine dependence: Secondary | ICD-10-CM | POA: Diagnosis not present

## 2016-05-20 ENCOUNTER — Other Ambulatory Visit (HOSPITAL_COMMUNITY): Payer: Self-pay | Admitting: Pharmacist

## 2016-05-20 MED ORDER — SIMVASTATIN 20 MG PO TABS: 20.0000 mg | ORAL_TABLET | Freq: Every day | ORAL | 3 refills | Status: DC

## 2016-05-25 DIAGNOSIS — Z23 Encounter for immunization: Secondary | ICD-10-CM | POA: Diagnosis not present

## 2016-06-18 DIAGNOSIS — I10 Essential (primary) hypertension: Secondary | ICD-10-CM | POA: Diagnosis not present

## 2016-06-18 DIAGNOSIS — Z79899 Other long term (current) drug therapy: Secondary | ICD-10-CM | POA: Diagnosis not present

## 2016-06-18 DIAGNOSIS — I252 Old myocardial infarction: Secondary | ICD-10-CM | POA: Diagnosis not present

## 2016-06-18 DIAGNOSIS — Z95810 Presence of automatic (implantable) cardiac defibrillator: Secondary | ICD-10-CM | POA: Diagnosis not present

## 2016-06-18 DIAGNOSIS — H25812 Combined forms of age-related cataract, left eye: Secondary | ICD-10-CM | POA: Diagnosis not present

## 2016-06-18 DIAGNOSIS — Z87891 Personal history of nicotine dependence: Secondary | ICD-10-CM | POA: Diagnosis not present

## 2016-06-19 ENCOUNTER — Telehealth: Payer: Self-pay | Admitting: Internal Medicine

## 2016-06-19 ENCOUNTER — Telehealth: Payer: Self-pay

## 2016-06-19 ENCOUNTER — Encounter: Payer: Self-pay | Admitting: Internal Medicine

## 2016-06-19 ENCOUNTER — Other Ambulatory Visit (HOSPITAL_COMMUNITY): Payer: Self-pay | Admitting: *Deleted

## 2016-06-19 ENCOUNTER — Telehealth: Payer: Self-pay | Admitting: Nurse Practitioner

## 2016-06-19 DIAGNOSIS — I472 Ventricular tachycardia, unspecified: Secondary | ICD-10-CM

## 2016-06-19 MED ORDER — AMIODARONE HCL 200 MG PO TABS: 200.0000 mg | ORAL_TABLET | Freq: Two times a day (BID) | ORAL | 3 refills | Status: DC

## 2016-06-19 NOTE — Telephone Encounter (Signed)
New message   Pt is calling regarding is transmisson, he states he has more question. He was speaking with Benjamine Mola and would like to speak to her again.

## 2016-06-19 NOTE — Telephone Encounter (Signed)
Spoke with patient. Symptoms have improved since taking extra amiodarone.  He is aware to take extra again tonight.  No syncope, chest pain, shortness of breath.  He is aware of ER precautions. Plan to keep appt next week as scheduled.  I asked him to send another transmission tomorrow morning so we could relook at burden.   Chanetta Marshall, NP 06/19/2016 2:27 PM

## 2016-06-19 NOTE — Telephone Encounter (Signed)
Spoke with pt, pt stated that he was having the fluttery sensation again, wanted to know if he should take some more amiodarone, informed pt that without a doctors order I could not advise. Pt asked if he should go on the ER, informed pt of CP and SOB protocol pt stated that he was just feeling uneasy, informed pt that if he felt like he should go to the ED before I could get back to him with Dr.Allred's recommendations that he could certainty do so.

## 2016-06-19 NOTE — Telephone Encounter (Signed)
Called pt back, informed him of Dr. Jackalyn Lombard recommendation, pt stated that he lived an hour away and had no one to bring him to the office today, informed him that Dr. Rayann Heman recommends for him to increase his Amiodarone to 200mg  twice a day,and for pt to be seen in the office. Pt agreeable to apt with Dr. Rayann Heman 06/26/2016 ,apt verified with Dr. Rayann Heman. Pt voiced understanding of increase in Amiodarone dosage.

## 2016-06-19 NOTE — Telephone Encounter (Signed)
Called pt back and let him know that transmission was received and he did have some episodes that were treated with ATP. Pt stated that he had taken his Amiodarone and Coreg as prescribed. He denied CP or SOB, stated that during times of his heart feeling "fluttery" he just had feelings of anxiousness and ease.Informed pt that he developed CP or SOB that he should call 911, pt voiced understanding. Informed pt that episodes would be reviewed with Dr. Rayann Heman and he would receive a call of Dr. Jackalyn Lombard recommendations. Pt voiced understanding of driving restrictions

## 2016-06-20 ENCOUNTER — Telehealth: Payer: Self-pay

## 2016-06-20 ENCOUNTER — Encounter: Payer: Self-pay | Admitting: Internal Medicine

## 2016-06-20 NOTE — Telephone Encounter (Signed)
Spoke with patient and reviewed remote transmission with him. I informed him that at this time we will make no changes and will keep his scheduled appointment with JA next week. I explained that if Dr. Rayann Heman made any additional recommendations I would call him back. Pt verbalized understanding.

## 2016-06-26 ENCOUNTER — Ambulatory Visit (INDEPENDENT_AMBULATORY_CARE_PROVIDER_SITE_OTHER): Payer: Medicare Other | Admitting: Internal Medicine

## 2016-06-26 ENCOUNTER — Encounter: Payer: Self-pay | Admitting: Internal Medicine

## 2016-06-26 ENCOUNTER — Telehealth (HOSPITAL_COMMUNITY): Payer: Self-pay | Admitting: Vascular Surgery

## 2016-06-26 ENCOUNTER — Other Ambulatory Visit: Payer: Self-pay

## 2016-06-26 VITALS — BP 152/86 | HR 63 | Ht 70.0 in | Wt 160.8 lb

## 2016-06-26 DIAGNOSIS — I5022 Chronic systolic (congestive) heart failure: Secondary | ICD-10-CM | POA: Diagnosis not present

## 2016-06-26 DIAGNOSIS — I255 Ischemic cardiomyopathy: Secondary | ICD-10-CM | POA: Diagnosis not present

## 2016-06-26 DIAGNOSIS — I472 Ventricular tachycardia, unspecified: Secondary | ICD-10-CM

## 2016-06-26 DIAGNOSIS — I2589 Other forms of chronic ischemic heart disease: Secondary | ICD-10-CM | POA: Diagnosis not present

## 2016-06-26 DIAGNOSIS — I10 Essential (primary) hypertension: Secondary | ICD-10-CM

## 2016-06-26 LAB — CUP PACEART INCLINIC DEVICE CHECK
Brady Statistic AP VP Percent: 0.02 %
Brady Statistic AP VS Percent: 6.41 %
Brady Statistic AS VS Percent: 93.54 %
HIGH POWER IMPEDANCE MEASURED VALUE: 53 Ohm
HighPow Impedance: 40 Ohm
Implantable Lead Implant Date: 20100520
Implantable Lead Location: 753859
Lead Channel Impedance Value: 380 Ohm
Lead Channel Impedance Value: 418 Ohm
Lead Channel Pacing Threshold Amplitude: 1.5 V
Lead Channel Pacing Threshold Pulse Width: 0.4 ms
Lead Channel Setting Pacing Amplitude: 2 V
Lead Channel Setting Pacing Amplitude: 2.5 V
Lead Channel Setting Pacing Pulse Width: 0.4 ms
Lead Channel Setting Sensing Sensitivity: 0.3 mV
MDC IDC LEAD IMPLANT DT: 20100520
MDC IDC LEAD LOCATION: 753860
MDC IDC MSMT BATTERY VOLTAGE: 2.64 V
MDC IDC MSMT LEADCHNL RA PACING THRESHOLD AMPLITUDE: 0.5 V
MDC IDC MSMT LEADCHNL RA PACING THRESHOLD PULSEWIDTH: 0.4 ms
MDC IDC MSMT LEADCHNL RA SENSING INTR AMPL: 3 mV
MDC IDC MSMT LEADCHNL RV SENSING INTR AMPL: 16.75 mV
MDC IDC PG IMPLANT DT: 20100520
MDC IDC SESS DTM: 20180221151553
MDC IDC STAT BRADY AS VP PERCENT: 0.03 %
MDC IDC STAT BRADY RA PERCENT PACED: 6.36 %
MDC IDC STAT BRADY RV PERCENT PACED: 0.06 %

## 2016-06-26 MED ORDER — CARVEDILOL 25 MG PO TABS: 25.0000 mg | ORAL_TABLET | Freq: Two times a day (BID) | ORAL | 3 refills | Status: DC

## 2016-06-26 MED ORDER — AMIODARONE HCL 200 MG PO TABS: 200.0000 mg | ORAL_TABLET | Freq: Every day | ORAL | 3 refills | Status: DC

## 2016-06-26 NOTE — Telephone Encounter (Signed)
Mr. Klinefelter wanted to make sure that we send in his amiodarone as a 90 day supply moving forward. I will wait until he sees Dr. Rayann Heman today in case he makes any changes in dose and Mr. Ugolini will call me afterward with an update.   Ruta Hinds. Velva Harman, PharmD, BCPS, CPP Clinical Pharmacist Pager: [phone] Phone: [phone] 06/26/2016 9:37 AM

## 2016-06-26 NOTE — Telephone Encounter (Signed)
Pt wants to speak to [facility] ASAP.Nathan Pruitt Please advise

## 2016-06-26 NOTE — Patient Instructions (Signed)
Medication Instructions:  Your physician has recommended you make the following change in your medication:  1) Decrease Amiodarone to 200 mg daily on 07/03/16 2) Increase Carvedilol to 25 mg twice daily   Labwork: Your physician recommends that you return for lab work today: BMP/Mag   Testing/Procedures: None ordered   Follow-Up: Your physician recommends that you schedule a follow-up appointment in: 6 weeks with Dr Rayann Heman   Any Other Special Instructions Will Be Listed Below (If Applicable).     If you need a refill on your cardiac medications before your next appointment, please call your pharmacy.

## 2016-06-26 NOTE — Progress Notes (Signed)
PCP:  HODGES,FRANCISCO, MD Primary Cardiologist:  Dr Aundra Dubin  The patient presents today for routine electrophysiology followup.  Since last being seen in our clinic, the patient reports doing reasonably well.  06/19/16 he developed VT storm for which he received ATP therapy.  His amiodarone was increased to 200mg  BID and he has done better since.  Episodes have resolved.  He has lost significant weight.  He reports that he was drinking very high doses of caffeine.  He quit doing this 06/19/16.  Today, he denies symptoms of chest pain, shortness of breath, orthopnea, PND, lower extremity edema, dizziness, presyncope, syncope, or neurologic sequela.  He is very anxious.  The patient feels that he is tolerating medications without difficulties and is otherwise without complaint today.   Past Medical History:  Diagnosis Date  . Abnormal PFTs 7/11   FVC 74%, FEV1 80%, ratio 75%, TLC 78%, DLCO 68%. this suggests a mild restrictive and obstructive defect. pt did have a response to bronchodilator. these PFTs were significantly better than the report from Dr. Alcide Clever in [place] done prior.   [name] Anxiety   . Atrial flutter ([place])    s/p isthmus ablation 5/10  . CAD (coronary artery disease)    hx of silent MI in 1993. likely an inferior MI. hx of 2D cardiogram in 3/09 showing EF of 40%. hx of myoview in [place] 3/09-nml. presented to [facility] 5/10 with VT and mildly elevated cardiac enzymes LHC (5/10): inferobasal dyskinesis with EF 35-40%. was chronic total occlusion of mid RCA with good collaterals. luminals LCA. this does not appear to be an acute cause of the 5/10 event  . Cancer ([place])    skin  . CHF (congestive heart failure) ([place])   . HLD (hyperlipidemia)   . HTN (hypertension)   . Ischemic cardiomyopathy    EF 35-40% by LV-gram 5/10 with inferobasal dyskinesis. echo 5/10 showed EF 40% w/mild LVH, no sig. MR, inferobasal and posterobasal akinesis. echo (7/11): EF 50%, mild LVH, basal-mid inferoposterior akenesis  .  PAD (peripheral artery disease) ([facility])    s/p L-to-R fem-fem bypass performed by Dr. Scot Dock at [facility] in 2009   . Rheumatoid arthritis(714.0)    on leflunomide  . Silent myocardial infarction 1993  . Tobacco abuse    47 pack year hx; quit 8/09  . 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 He has duel chamber Medtronic ICD   Past Surgical History:  Procedure Laterality Date  . ABLATION    . CARDIAC DEFIBRILLATOR PLACEMENT    . EP IMPLANTABLE DEVICE  09/22/08   Medtronic, ICD Model Number:  [id number], ICD Serial Number: [id number]  . FEMORAL ARTERY - FEMORAL ARTERY BYPASS GRAFT  march 2009   left to right bypass, first @ [facility], second at [facility] by Dr Scot Dock  . TONSILLECTOMY    . VASECTOMY      Current Outpatient Prescriptions  Medication Sig Dispense Refill  . amiodarone (PACERONE) 200 MG tablet Take 1 tablet (200 mg total) by mouth 2 (two) times daily. 60 tablet 3  . aspirin EC 81 MG tablet Take 1 tablet (81 mg total) by mouth daily.    . carvedilol (COREG) 12.5 MG tablet Take 1 tablet (12.5 mg total) by mouth 2 (two) times daily with a meal. 180 tablet 3  . chlorhexidine (PERIDEX) 0.12 % solution Reported on 07/12/2015 - Use as directed  3  . cyclobenzaprine (FLEXERIL) 5 MG tablet  Take 5 mg by mouth 3 (three) times daily as needed for muscle spasms. Reported on 05/18/2015    . ezetimibe (ZETIA) 10 MG tablet Take 1 tablet (10 mg total) by mouth daily. 90 tablet 3  . leflunomide (ARAVA) 20 MG tablet Take 20 mg by mouth daily.      . simvastatin (ZOCOR) 20 MG tablet Take 1 tablet (20 mg total) by mouth at bedtime. 90 tablet 3  . traMADol (ULTRAM) 50 MG tablet Take 1 tablet by mouth as needed for moderate pain (Take as directed). Reported on 05/18/2015  2  . valsartan (DIOVAN) 80 MG tablet Take 1 tablet (80 mg total) by mouth 2 (two) times daily. 180 tablet 3  . torsemide (DEMADEX) 20 MG tablet Take 1  tablet (20 mg total) by mouth daily. 90 tablet 3   No current facility-administered medications for this visit.     Allergies  Allergen Reactions  . Atorvastatin Other (See Comments)    Muscle pain  . Crestor [Rosuvastatin Calcium] Other (See Comments)    Muscle pain  . Losartan Other (See Comments)    Muscle pain   . Telmisartan Other (See Comments)    Stomach ache    Social History   Social History  . Marital status: Single    Spouse name: N/A  . Number of children: N/A  . Years of education: N/A   Occupational History  . Not on file.   Social History Main Topics  . Smoking status: Former Smoker    Years: 47.00  . Smokeless tobacco: Never Used     Comment: QUIT 2009  . Alcohol use 0.0 oz/week     Comment: minimal   . Drug use: No     Comment: prior   . Sexual activity: Not on file   Other Topics Concern  . Not on file   Social History Narrative   Single; gets minimal exercise; 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 originally from [place]    Physical Exam: Vitals:   06/26/16 1148  BP: (!) 152/86  Pulse: 63  Weight: 160 lb 12.8 oz (72.9 kg)  Height: 5\' 10"  (1.778 m)    GEN- The patient is thin appearing, alert and oriented x 3 today.   Head- normocephalic, atraumatic Eyes-  Sclera clear, conjunctiva pink Ears- hearing intact Oropharynx- clear Neck- supple, no JVP Lungs- Clear to ausculation bilaterally, normal work of breathing Chest- ICD pocket is well healed Heart- Regular rate and rhythm, no murmurs, rubs or gallops, PMI not laterally displaced GI- soft, NT, ND, + BS Extremities- no clubbing, cyanosis, or edema  ICD interrogation- reviewed in detail today,  See PACEART report ekg today reveal sinus 63 bpm, PR 232 msec, IVCD, PVCs, Qtc 472 msec  Assessment and Plan:  1. VT Improved with recently increased amiodarone Continue amiodarone 200mg  BID x 7 days then reduce to 200mg  daily.  I have been very clear that he should not continue twice daily amiodarone  after that. Increase coreg to 25mg  BID Bmet, mg  2. CAD/ischemic CM Stable No change required today  3. HTN Elevated Increase coreg  4. Abnormal TFTs I have been very clear that he should follow-up with his PCP regarding this.  The patient has recently had unstable arrhythmias.  He is at increased risk of hospitalization, decompensation, sudden death.  A high level of decision making was required  carelink Return to see me in 6 weeks   Thompson Grayer MD, [facility] 06/26/2016 11:58 AM

## 2016-06-27 LAB — BASIC METABOLIC PANEL
BUN / CREAT RATIO: 12 (ref 10–24)
BUN: 19 mg/dL (ref 8–27)
CHLORIDE: 98 mmol/L (ref 96–106)
CO2: 26 mmol/L (ref 18–29)
Calcium: 8.8 mg/dL (ref 8.6–10.2)
Creatinine, Ser: 1.54 mg/dL — ABNORMAL HIGH (ref 0.76–1.27)
GFR calc Af Amer: 52 — ABNORMAL LOW (ref >59)
GFR calc non Af Amer: 45 — ABNORMAL LOW (ref >59)
GLUCOSE: 99 mg/dL (ref 65–99)
POTASSIUM: 4 mmol/L (ref 3.5–5.2)
SODIUM: 141 mmol/L (ref 134–144)

## 2016-06-27 LAB — MAGNESIUM: MAGNESIUM: 2 mg/dL (ref 1.6–2.3)

## 2016-07-01 ENCOUNTER — Encounter: Payer: Self-pay | Admitting: Internal Medicine

## 2016-07-01 ENCOUNTER — Encounter (HOSPITAL_COMMUNITY): Payer: Self-pay

## 2016-07-02 DIAGNOSIS — L570 Actinic keratosis: Secondary | ICD-10-CM | POA: Diagnosis not present

## 2016-07-07 ENCOUNTER — Encounter (HOSPITAL_COMMUNITY): Payer: Self-pay

## 2016-07-08 ENCOUNTER — Encounter: Payer: Self-pay | Admitting: Internal Medicine

## 2016-07-08 ENCOUNTER — Telehealth (HOSPITAL_COMMUNITY): Payer: Self-pay | Admitting: Pharmacist

## 2016-07-08 ENCOUNTER — Encounter (HOSPITAL_COMMUNITY): Payer: Self-pay

## 2016-07-08 MED ORDER — VALSARTAN 160 MG PO TABS: 160.0000 mg | ORAL_TABLET | Freq: Two times a day (BID) | ORAL | 3 refills | Status: DC

## 2016-07-08 NOTE — Telephone Encounter (Signed)
Received a message from [name] stating that his SBP has been consistently in the 150's. Discussed with Dr. Aundra Dubin who is agreeable to increasing his valsartan to 160 mg twice daily with a follow up BMET in 1 week.   Ruta Hinds. Velva Harman, PharmD, BCPS, CPP Clinical Pharmacist Pager: [phone] Phone: [phone] 07/08/2016 10:26 AM

## 2016-07-10 ENCOUNTER — Encounter: Payer: Self-pay | Admitting: Internal Medicine

## 2016-07-10 ENCOUNTER — Ambulatory Visit (INDEPENDENT_AMBULATORY_CARE_PROVIDER_SITE_OTHER): Payer: Medicare Other | Admitting: Internal Medicine

## 2016-07-10 VITALS — BP 148/84 | HR 77 | Ht 70.0 in | Wt 168.0 lb

## 2016-07-10 DIAGNOSIS — I255 Ischemic cardiomyopathy: Secondary | ICD-10-CM

## 2016-07-10 DIAGNOSIS — R0602 Shortness of breath: Secondary | ICD-10-CM

## 2016-07-10 DIAGNOSIS — I472 Ventricular tachycardia: Secondary | ICD-10-CM | POA: Diagnosis not present

## 2016-07-10 DIAGNOSIS — I2589 Other forms of chronic ischemic heart disease: Secondary | ICD-10-CM

## 2016-07-10 DIAGNOSIS — Z95810 Presence of automatic (implantable) cardiac defibrillator: Secondary | ICD-10-CM | POA: Diagnosis not present

## 2016-07-10 DIAGNOSIS — I4729 Other ventricular tachycardia: Secondary | ICD-10-CM

## 2016-07-10 LAB — CUP PACEART INCLINIC DEVICE CHECK
Battery Voltage: 2.62 V
Brady Statistic AP VP Percent: 1.01 %
Brady Statistic AP VS Percent: 52.24 %
Brady Statistic RA Percent Paced: 48.48 %
Brady Statistic RV Percent Paced: 1.65 %
Date Time Interrogation Session: 20180307121004
HIGH POWER IMPEDANCE MEASURED VALUE: 37 Ohm
HIGH POWER IMPEDANCE MEASURED VALUE: 49 Ohm
Implantable Lead Implant Date: 20100520
Implantable Lead Model: 5076
Implantable Pulse Generator Implant Date: 20100520
Lead Channel Impedance Value: 342 Ohm
Lead Channel Impedance Value: 418 Ohm
Lead Channel Pacing Threshold Amplitude: 0.5 V
Lead Channel Pacing Threshold Amplitude: 1.75 V
Lead Channel Pacing Threshold Pulse Width: 0.4 ms
Lead Channel Setting Sensing Sensitivity: 0.3 mV
MDC IDC LEAD IMPLANT DT: 20100520
MDC IDC LEAD LOCATION: 753859
MDC IDC LEAD LOCATION: 753860
MDC IDC MSMT LEADCHNL RA SENSING INTR AMPL: 1.875 mV
MDC IDC MSMT LEADCHNL RV PACING THRESHOLD PULSEWIDTH: 0.4 ms
MDC IDC MSMT LEADCHNL RV SENSING INTR AMPL: 8.125 mV
MDC IDC SET LEADCHNL RA PACING AMPLITUDE: 2 V
MDC IDC SET LEADCHNL RV PACING AMPLITUDE: 2.5 V
MDC IDC SET LEADCHNL RV PACING PULSEWIDTH: 0.4 ms
MDC IDC STAT BRADY AS VP PERCENT: 0.42 %
MDC IDC STAT BRADY AS VS PERCENT: 46.33 %

## 2016-07-10 LAB — BASIC METABOLIC PANEL
BUN / CREAT RATIO: 12 (ref 10–24)
BUN: 17 mg/dL (ref 8–27)
CHLORIDE: 100 mmol/L (ref 96–106)
CO2: 23 mmol/L (ref 18–29)
Calcium: 8.7 mg/dL (ref 8.6–10.2)
Creatinine, Ser: 1.4 mg/dL — ABNORMAL HIGH (ref 0.76–1.27)
GFR, EST AFRICAN AMERICAN: 58 mL/min/{1.73_m2} — AB (ref >59)
GFR, EST NON AFRICAN AMERICAN: 50 mL/min/{1.73_m2} — AB (ref >59)
Glucose: 103 mg/dL — ABNORMAL HIGH (ref 65–99)
POTASSIUM: 4.7 mmol/L (ref 3.5–5.2)
Sodium: 140 mmol/L (ref 134–144)

## 2016-07-10 LAB — CBC WITH DIFFERENTIAL/PLATELET
BASOS: 1 %
Basophils Absolute: 0.1 10*3/uL (ref 0.0–0.2)
EOS (ABSOLUTE): 0.2 10*3/uL (ref 0.0–0.4)
EOS: 2 %
HEMATOCRIT: 34.9 % — AB (ref 37.5–51.0)
HEMOGLOBIN: 11.7 g/dL — AB (ref 13.0–17.7)
Immature Grans (Abs): 0 10*3/uL (ref 0.0–0.1)
Immature Granulocytes: 0 %
LYMPHS ABS: 1.3 10*3/uL (ref 0.7–3.1)
Lymphs: 17 %
MCH: 30.2 pg (ref 26.6–33.0)
MCHC: 33.5 g/dL (ref 31.5–35.7)
MCV: 90 fL (ref 79–97)
MONOCYTES: 11 %
MONOS ABS: 0.8 10*3/uL (ref 0.1–0.9)
NEUTROS ABS: 5.5 10*3/uL (ref 1.4–7.0)
Neutrophils: 69 %
Platelets: 225 10*3/uL (ref 150–379)
RBC: 3.87 x10E6/uL — AB (ref 4.14–5.80)
RDW: 14.4 % (ref 12.3–15.4)
WBC: 7.8 10*3/uL (ref 3.4–10.8)

## 2016-07-10 LAB — PROTIME-INR
INR: 1 (ref 0.8–1.2)
PROTHROMBIN TIME: 10.6 s (ref 9.1–12.0)

## 2016-07-10 MED ORDER — AMIODARONE HCL 200 MG PO TABS: 200.0000 mg | ORAL_TABLET | Freq: Every day | ORAL | Status: DC

## 2016-07-10 NOTE — Progress Notes (Signed)
-  PCP:  Maryella Shivers, MD Primary Cardiologist:  Dr Aundra Dubin  The patient presents today for routine electrophysiology followup.  Since last being seen in our clinic, the patient reports doing reasonably well.  06/19/16 he developed VT storm for which he received ATP therapy.  His amiodarone was increased to 200mg  BID and he has done better since.  Last visit, I reduced amiodarone back to 200mg  daily.  He developed PVCs and thus increased it on his own back to 300mg  daily. He reports progressive SOB.  He has difficulty with light activities. Today, he denies symptoms of chest pain,  orthopnea, PND, lower extremity edema, dizziness, presyncope, syncope, or neurologic sequela.  He is very anxious and less pleasant today.  The patient feels that he is tolerating medications without difficulties and is otherwise without complaint today.   Past Medical History:  Diagnosis Date  . Abnormal PFTs 7/11   FVC 74%, FEV1 80%, ratio 75%, TLC 78%, DLCO 68%. this suggests a mild restrictive and obstructive defect. pt did have a response to bronchodilator. these PFTs were significantly better than the report from Dr. Alcide Clever in [place] done prior.   [name] Anxiety   . Atrial flutter ([place])    s/p isthmus ablation 5/10  . CAD (coronary artery disease)    hx of silent MI in 1993. likely an inferior MI. hx of 2D cardiogram in 3/09 showing EF of 40%. hx of myoview in [place] 3/09-nml. presented to [facility] 5/10 with VT and mildly elevated cardiac enzymes LHC (5/10): inferobasal dyskinesis with EF 35-40%. was chronic total occlusion of mid RCA with good collaterals. luminals LCA. this does not appear to be an acute cause of the 5/10 event  . Cancer ([place])    skin  . CHF (congestive heart failure) ([place])   . HLD (hyperlipidemia)   . HTN (hypertension)   . Ischemic cardiomyopathy    EF 35-40% by LV-gram 5/10 with inferobasal dyskinesis. echo 5/10 showed EF 40% w/mild LVH, no sig. MR, inferobasal and posterobasal akinesis. echo  (7/11): EF 50%, mild LVH, basal-mid inferoposterior akenesis  . PAD (peripheral artery disease) (HCC)    s/p L-to-R fem-fem bypass performed by Dr. Scot Dock at [facility] in 2009   . Rheumatoid arthritis(714.0)    on leflunomide  . Silent myocardial infarction 1993  . Tobacco abuse    47 pack year hx; quit 8/09  . 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 He has duel chamber Medtronic ICD   Past Surgical History:  Procedure Laterality Date  . ABLATION    . CARDIAC DEFIBRILLATOR PLACEMENT    . EP IMPLANTABLE DEVICE  09/22/08   Medtronic, ICD Model Number:  D274DRG, ICD Serial Number: [id number]  . FEMORAL ARTERY - FEMORAL ARTERY BYPASS GRAFT  march 2009   left to right bypass, first @ [facility], second at [facility] by Dr Scot Dock  . TONSILLECTOMY    . VASECTOMY      Current Outpatient Prescriptions  Medication Sig Dispense Refill  . amiodarone (PACERONE) 200 MG tablet Take 1 tablet (200 mg) by mouth in the morning and 1/2 tablet (100 mg) in the evening    . aspirin EC 81 MG tablet Take 1 tablet (81 mg total) by mouth daily.    . carvedilol (COREG) 25 MG tablet Take 1 tablet (25 mg total) by mouth 2 (two) times daily with a meal. 180 tablet 3  . chlorhexidine (PERIDEX) 0.12 % solution  Reported on 07/12/2015 - Use as directed  3  . cyclobenzaprine (FLEXERIL) 5 MG tablet Take 5 mg by mouth 3 (three) times daily as needed for muscle spasms. Reported on 05/18/2015    . ezetimibe (ZETIA) 10 MG tablet Take 1 tablet (10 mg total) by mouth daily. 90 tablet 3  . leflunomide (ARAVA) 20 MG tablet Take 20 mg by mouth daily.      . simvastatin (ZOCOR) 20 MG tablet Take 1 tablet (20 mg total) by mouth at bedtime. 90 tablet 3  . torsemide (DEMADEX) 20 MG tablet Take 1 tablet (20 mg total) by mouth daily. 90 tablet 3  . traMADol (ULTRAM) 50 MG tablet Take 1 tablet by mouth as needed for moderate pain (Take as directed).  Reported on 05/18/2015  2  . valsartan (DIOVAN) 160 MG tablet Take 1 tablet (160 mg total) by mouth 2 (two) times daily. 180 tablet 3   No current facility-administered medications for this visit.     Allergies  Allergen Reactions  . Atorvastatin Other (See Comments)    Muscle pain  . Crestor [Rosuvastatin Calcium] Other (See Comments)    Muscle pain  . Losartan Other (See Comments)    Muscle pain   . Telmisartan Other (See Comments)    Stomach ache    Social History   Social History  . Marital status: Single    Spouse name: N/A  . Number of children: N/A  . Years of education: N/A   Occupational History  . Not on file.   Social History Main Topics  . Smoking status: Former Smoker    Years: 47.00  . Smokeless tobacco: Never Used     Comment: QUIT 2009  . Alcohol use 0.0 oz/week     Comment: minimal   . Drug use: No     Comment: prior   . Sexual activity: Not on file   Other Topics Concern  . Not on file   Social History Narrative   Single; gets minimal exercise; 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 originally from [place]    Physical Exam: Vitals:   07/10/16 0840  BP: (!) 148/84  Pulse: 77  Weight: 168 lb (76.2 kg)  Height: 5\' 10"  (1.778 m)    GEN- The patient is thin appearing, alert and oriented x 3 today.  Not very pleasant today. Head- normocephalic, atraumatic Eyes-  Sclera clear, conjunctiva pink Ears- hearing intact Oropharynx- clear Neck- supple, no JVP Lungs- Clear to ausculation bilaterally, normal work of breathing Chest- ICD pocket is well healed Heart- Regular rate and rhythm, no murmurs, rubs or gallops, PMI not laterally displaced GI- soft, NT, ND, + BS Extremities- no clubbing, cyanosis, or edema  ICD interrogation- personally reviewed in detail today,  See PACEART report ekg today reveal sinus 77 bpm, PR 306 msec, IVCD, PVCs, Qtc 522 msec  Assessment and Plan:  1. VT Improved with recently increased amiodarone.  I have cautioned about  increasing amiodarone on his own.  I have advised that he reduce amiodarone back to 200mg  daily today.  Risks of this medicine discussed in detail today.  He states "I had rather be dead than have those palpitations". No driving (pt aware) Will schedule for echo, RHC/LHC (see below)  2. CAD/ischemic CM Given worsening SOB in the setting of recent VT, I worry about progressive CAD. I have therefore advised RHC/LHC at this time.  Risks of procedure discussed with the patient who wishes to proceed. Will obtain echo to evaluate for progressive  cardiomyopathy  3. HTN Stable No change required today  4. Abnormal TFTs I have been very clear that he should follow-up with his PCP regarding this.  The patient has recently had unstable arrhythmias.  He is at increased risk of hospitalization, decompensation, sudden death.  A high level of decision making was required  carelink Return to see me in 4 weeks   Thompson Grayer MD, [facility] 07/10/2016 9:09 AM

## 2016-07-10 NOTE — Patient Instructions (Addendum)
Medication Instructions:  Your physician has recommended you make the following change in your medication:  1) Decrease Amiodarone to 200 mg daily   Labwork: Your physician recommends that you return for lab work today: BMP/CBC/INR   Testing/Procedures: Your physician has requested that you have an echocardiogram. Echocardiography is a painless test that uses sound waves to create images of your heart. It provides your doctor with information about the size and shape of your heart and how well your heart's chambers and valves are working. This procedure takes approximately one hour. There are no restrictions for this procedure.  Your physician has requested that you have a cardiac catheterization. Cardiac catheterization is used to diagnose and/or treat various heart conditions. Doctors may recommend this procedure for a number of different reasons. The most common reason is to evaluate chest pain. Chest pain can be a symptom of coronary artery disease (CAD), and cardiac catheterization can show whether plaque is narrowing or blocking your heart's arteries. This procedure is also used to evaluate the valves, as well as measure the blood flow and oxygen levels in different parts of your heart. For further information please visit [email or web address][facility]tn. Please follow instruction sheet, as given.---07/12/16  Please arrive at [facility] at 9:30am Do not eat or drink after midnight the night prior to your procedure Okay to take your medication the morning of the procedure with a small sip of water---HOLD fluid pill--Torsemide Plan for one night stay Will need someone to drive you home after discharge    Follow-Up: Your physician recommends that you schedule a follow-up appointment as scheduled with Dr Rayann Heman   Any Other Special Instructions Will Be Listed Below (If Applicable).     If you need a refill on your cardiac medications before your next  appointment, please call your pharmacy.

## 2016-07-10 NOTE — Progress Notes (Signed)
Ellwood Dense, I'll put him on for right and left with me on Friday, know him well.

## 2016-07-12 ENCOUNTER — Encounter (HOSPITAL_COMMUNITY)
Admission: RE | Disposition: A | Discharge status: Home / Self Care | Payer: Self-pay | Source: Ambulatory Visit | Attending: Interventional Cardiology

## 2016-07-12 ENCOUNTER — Ambulatory Visit (HOSPITAL_BASED_OUTPATIENT_CLINIC_OR_DEPARTMENT_OTHER): Payer: Medicare Other

## 2016-07-12 ENCOUNTER — Ambulatory Visit (HOSPITAL_COMMUNITY)
Admission: RE | Admit: 2016-07-12 | Discharge: 2016-07-12 | Disposition: A | Discharge status: Home / Self Care | Payer: Medicare Other | Source: Ambulatory Visit | Attending: Interventional Cardiology | Admitting: Interventional Cardiology

## 2016-07-12 DIAGNOSIS — I252 Old myocardial infarction: Secondary | ICD-10-CM | POA: Diagnosis not present

## 2016-07-12 DIAGNOSIS — I110 Hypertensive heart disease with heart failure: Secondary | ICD-10-CM | POA: Insufficient documentation

## 2016-07-12 DIAGNOSIS — F419 Anxiety disorder, unspecified: Secondary | ICD-10-CM | POA: Insufficient documentation

## 2016-07-12 DIAGNOSIS — I2582 Chronic total occlusion of coronary artery: Secondary | ICD-10-CM | POA: Insufficient documentation

## 2016-07-12 DIAGNOSIS — Z7982 Long term (current) use of aspirin: Secondary | ICD-10-CM | POA: Diagnosis not present

## 2016-07-12 DIAGNOSIS — I472 Ventricular tachycardia: Secondary | ICD-10-CM | POA: Diagnosis not present

## 2016-07-12 DIAGNOSIS — I509 Heart failure, unspecified: Secondary | ICD-10-CM

## 2016-07-12 DIAGNOSIS — Z87891 Personal history of nicotine dependence: Secondary | ICD-10-CM | POA: Insufficient documentation

## 2016-07-12 DIAGNOSIS — Z95810 Presence of automatic (implantable) cardiac defibrillator: Secondary | ICD-10-CM | POA: Insufficient documentation

## 2016-07-12 DIAGNOSIS — I2510 Atherosclerotic heart disease of native coronary artery without angina pectoris: Secondary | ICD-10-CM | POA: Diagnosis not present

## 2016-07-12 DIAGNOSIS — E785 Hyperlipidemia, unspecified: Secondary | ICD-10-CM | POA: Diagnosis not present

## 2016-07-12 DIAGNOSIS — I255 Ischemic cardiomyopathy: Secondary | ICD-10-CM | POA: Diagnosis not present

## 2016-07-12 DIAGNOSIS — M069 Rheumatoid arthritis, unspecified: Secondary | ICD-10-CM | POA: Diagnosis not present

## 2016-07-12 HISTORY — PX: RIGHT/LEFT HEART CATH AND CORONARY ANGIOGRAPHY: CATH118266

## 2016-07-12 LAB — ECHOCARDIOGRAM COMPLETE
AVLVOTPG: 3 mmHg
CHL CUP TV REG PEAK VELOCITY: 300 cm/s
E decel time: 327 msec
EERAT: 7.98
FS: 12 % — AB (ref 28–44)
Height: 70 in
IVS/LV PW RATIO, ED: 1.15
LA ID, A-P, ES: 37 mm
LA diam end sys: 37 mm
LA diam index: 1.95 cm/m2
LA vol A4C: 64 ml
LAVOL: 72.8 mL
LAVOLIN: 38.3 mL/m2
LDCA: 2.84 cm2
LV E/e' medial: 7.98
LV PW d: 9.48 mm — AB (ref 0.6–1.1)
LV dias vol: 165 mL — AB (ref 62–150)
LV e' LATERAL: 8.7 cm/s
LV sys vol index: 57 mL/m2
LVDIAVOLIN: 87 mL/m2
LVEEAVG: 7.98
LVOT SV: 60 mL
LVOT VTI: 21.3 cm
LVOT diameter: 19 mm
LVOT peak vel: 81.3 cm/s
LVSYSVOL: 108 mL — AB (ref 21–61)
MV Dec: 327
MV pk A vel: 70.3 m/s
MVPKEVEL: 69.4 m/s
RV LATERAL S' VELOCITY: 9.46 cm/s
RV sys press: 39 mmHg
Simpson's disk: 35
Stroke v: 57 ml
TAPSE: 22.6 mm
TDI e' lateral: 8.7
TDI e' medial: 4.35
TR max vel: 300 cm/s
Weight: 2560 oz

## 2016-07-12 LAB — POCT I-STAT 3, VENOUS BLOOD GAS (G3P V)
ACID-BASE EXCESS: 1 mmol/L (ref 0.0–2.0)
BICARBONATE: 25 mmol/L (ref 20.0–28.0)
O2 Saturation: 60 %
PH VEN: 7.426 (ref 7.250–7.430)
TCO2: 26 mmol/L (ref 0–100)
pCO2, Ven: 38 mmHg — ABNORMAL LOW (ref 44.0–60.0)
pO2, Ven: 30 mmHg — CL (ref 32.0–45.0)

## 2016-07-12 SURGERY — RIGHT/LEFT HEART CATH AND CORONARY ANGIOGRAPHY

## 2016-07-12 MED ORDER — NITROGLYCERIN 1 MG/10 ML FOR IR/CATH LAB
INTRA_ARTERIAL | Status: AC
  Filled 2016-07-12: qty 10

## 2016-07-12 MED ORDER — SODIUM CHLORIDE 0.9% FLUSH: 3.0000 mL | Freq: Two times a day (BID) | INTRAVENOUS | Status: DC

## 2016-07-12 MED ORDER — MIDAZOLAM HCL 2 MG/2ML IJ SOLN
INTRAMUSCULAR | Status: AC
  Filled 2016-07-12: qty 2

## 2016-07-12 MED ORDER — HEPARIN SODIUM (PORCINE) 1000 UNIT/ML IJ SOLN
INTRAMUSCULAR | Status: DC | PRN
  Administered 2016-07-12: 4500 [IU] via INTRAVENOUS

## 2016-07-12 MED ORDER — LIDOCAINE HCL (PF) 1 % IJ SOLN
INTRAMUSCULAR | Status: AC
  Filled 2016-07-12: qty 30

## 2016-07-12 MED ORDER — ASPIRIN 81 MG PO CHEW
CHEWABLE_TABLET | ORAL | Status: AC
  Administered 2016-07-12: 81 mg via ORAL
  Filled 2016-07-12: qty 1

## 2016-07-12 MED ORDER — SODIUM CHLORIDE 0.9% FLUSH: 3.0000 mL | INTRAVENOUS | Status: DC | PRN

## 2016-07-12 MED ORDER — FENTANYL CITRATE (PF) 100 MCG/2ML IJ SOLN
INTRAMUSCULAR | Status: DC | PRN
  Administered 2016-07-12 (×2): 25 ug via INTRAVENOUS

## 2016-07-12 MED ORDER — HEPARIN (PORCINE) IN NACL 2-0.9 UNIT/ML-% IJ SOLN
INTRAMUSCULAR | Status: DC | PRN
  Administered 2016-07-12: 1000 mL

## 2016-07-12 MED ORDER — HEPARIN (PORCINE) IN NACL 2-0.9 UNIT/ML-% IJ SOLN
INTRAMUSCULAR | Status: AC
  Filled 2016-07-12: qty 1000

## 2016-07-12 MED ORDER — VERAPAMIL HCL 2.5 MG/ML IV SOLN
INTRAVENOUS | Status: DC | PRN
  Administered 2016-07-12: 10 mL via INTRA_ARTERIAL

## 2016-07-12 MED ORDER — SODIUM CHLORIDE 0.9 % IV SOLN: 250.0000 mL | INTRAVENOUS | Status: DC | PRN

## 2016-07-12 MED ORDER — MIDAZOLAM HCL 2 MG/2ML IJ SOLN
INTRAMUSCULAR | Status: DC | PRN
  Administered 2016-07-12 (×2): 1 mg via INTRAVENOUS

## 2016-07-12 MED ORDER — LIDOCAINE HCL (PF) 1 % IJ SOLN
INTRAMUSCULAR | Status: DC | PRN
Start: 2016-07-12 — End: 2016-07-12
  Administered 2016-07-12: 10 mL via INTRADERMAL

## 2016-07-12 MED ORDER — SODIUM CHLORIDE 0.9 % IV SOLN
INTRAVENOUS | Status: DC
  Administered 2016-07-12: 11:00:00 via INTRAVENOUS

## 2016-07-12 MED ORDER — SODIUM CHLORIDE 0.9 % IV SOLN: INTRAVENOUS | Status: DC

## 2016-07-12 MED ORDER — VERAPAMIL HCL 2.5 MG/ML IV SOLN
INTRAVENOUS | Status: AC
  Filled 2016-07-12: qty 2

## 2016-07-12 MED ORDER — ASPIRIN 81 MG PO CHEW
81.0000 mg | CHEWABLE_TABLET | ORAL | Status: AC
  Administered 2016-07-12: 81 mg via ORAL

## 2016-07-12 MED ORDER — FENTANYL CITRATE (PF) 100 MCG/2ML IJ SOLN
INTRAMUSCULAR | Status: AC
  Filled 2016-07-12: qty 2

## 2016-07-12 MED ORDER — ONDANSETRON HCL 4 MG/2ML IJ SOLN: 4.0000 mg | Freq: Four times a day (QID) | INTRAMUSCULAR | Status: DC | PRN

## 2016-07-12 MED ORDER — IOPAMIDOL (ISOVUE-370) INJECTION 76%
INTRAVENOUS | Status: DC | PRN
  Administered 2016-07-12: 65 mL via INTRA_ARTERIAL

## 2016-07-12 MED ORDER — ACETAMINOPHEN 325 MG PO TABS: 650.0000 mg | ORAL_TABLET | ORAL | Status: DC | PRN

## 2016-07-12 SURGICAL SUPPLY — 14 items
CATH BALLN WEDGE 5F 110CM (CATHETERS) ×2 IMPLANT
CATH EXPO 5FR FR4 (CATHETERS) ×2 IMPLANT
CATH INFINITI 5 FR JL3.5 (CATHETERS) ×2 IMPLANT
CATH INFINITI 5FR ANG PIGTAIL (CATHETERS) ×2 IMPLANT
GLIDESHEATH SLEND SS 6F .021 (SHEATH) ×2 IMPLANT
GUIDEWIRE .025 260CM (WIRE) ×2 IMPLANT
GUIDEWIRE INQWIRE 1.5J.035X260 (WIRE) ×1 IMPLANT
INQWIRE 1.5J .035X260CM (WIRE) ×2
KIT HEART LEFT (KITS) ×2 IMPLANT
PACK CARDIAC CATHETERIZATION (CUSTOM PROCEDURE TRAY) ×2 IMPLANT
SHEATH FAST CATH BRACH 5F 5CM (SHEATH) ×2 IMPLANT
SYR MEDRAD MARK V 150ML (SYRINGE) ×2 IMPLANT
TRANSDUCER W/STOPCOCK (MISCELLANEOUS) ×2 IMPLANT
TUBING CIL FLEX 10 FLL-RA (TUBING) ×2 IMPLANT

## 2016-07-12 NOTE — Interval H&P Note (Signed)
History and Physical Interval Note:  07/12/2016 1:02 PM  Nathan Pruitt  has presented today for surgery, with the diagnosis of shrotness of breath - tachicardia  The various methods of treatment have been discussed with the patient and family. After consideration of risks, benefits and other options for treatment, the patient has consented to  Procedure(s): Right/Left Heart Cath and Coronary Angiography (N/A) as a surgical intervention .  The patient's history has been reviewed, patient examined, no change in status, stable for surgery.  I have reviewed the patient's chart and labs.  Questions were answered to the patient's satisfaction.     Mariela Rex [employer]

## 2016-07-12 NOTE — H&P (View-Only) (Signed)
-  PCP:  Maryella Shivers, MD Primary Cardiologist:  Dr Aundra Dubin  The patient presents today for routine electrophysiology followup.  Since last being seen in our clinic, the patient reports doing reasonably well.  06/19/16 he developed VT storm for which he received ATP therapy.  His amiodarone was increased to 200mg  BID and he has done better since.  Last visit, I reduced amiodarone back to 200mg  daily.  He developed PVCs and thus increased it on his own back to 300mg  daily. He reports progressive SOB.  He has difficulty with light activities. Today, he denies symptoms of chest pain,  orthopnea, PND, lower extremity edema, dizziness, presyncope, syncope, or neurologic sequela.  He is very anxious and less pleasant today.  The patient feels that he is tolerating medications without difficulties and is otherwise without complaint today.   Past Medical History:  Diagnosis Date  . Abnormal PFTs 7/11   FVC 74%, FEV1 80%, ratio 75%, TLC 78%, DLCO 68%. this suggests a mild restrictive and obstructive defect. pt did have a response to bronchodilator. these PFTs were significantly better than the report from Dr. Alcide Clever in [place] done prior.   [name] Anxiety   . Atrial flutter ([place])    s/p isthmus ablation 5/10  . CAD (coronary artery disease)    hx of silent MI in 1993. likely an inferior MI. hx of 2D cardiogram in 3/09 showing EF of 40%. hx of myoview in [place] 3/09-nml. presented to [facility] 5/10 with VT and mildly elevated cardiac enzymes LHC (5/10): inferobasal dyskinesis with EF 35-40%. was chronic total occlusion of mid RCA with good collaterals. luminals LCA. this does not appear to be an acute cause of the 5/10 event  . Cancer ([place])    skin  . CHF (congestive heart failure) ([place])   . HLD (hyperlipidemia)   . HTN (hypertension)   . Ischemic cardiomyopathy    EF 35-40% by LV-gram 5/10 with inferobasal dyskinesis. echo 5/10 showed EF 40% w/mild LVH, no sig. MR, inferobasal and posterobasal akinesis. echo  (7/11): EF 50%, mild LVH, basal-mid inferoposterior akenesis  . PAD (peripheral artery disease) (HCC)    s/p L-to-R fem-fem bypass performed by Dr. Scot Dock at [facility] in 2009   . Rheumatoid arthritis(714.0)    on leflunomide  . Silent myocardial infarction 1993  . Tobacco abuse    47 pack year hx; quit 8/09  . 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 He has duel chamber Medtronic ICD   Past Surgical History:  Procedure Laterality Date  . ABLATION    . CARDIAC DEFIBRILLATOR PLACEMENT    . EP IMPLANTABLE DEVICE  09/22/08   Medtronic, ICD Model Number:  [id number], ICD Serial Number: [id number]  . FEMORAL ARTERY - FEMORAL ARTERY BYPASS GRAFT  march 2009   left to right bypass, first @ [facility], second at [facility] by Dr Scot Dock  . TONSILLECTOMY    . VASECTOMY      Current Outpatient Prescriptions  Medication Sig Dispense Refill  . amiodarone (PACERONE) 200 MG tablet Take 1 tablet (200 mg) by mouth in the morning and 1/2 tablet (100 mg) in the evening    . aspirin EC 81 MG tablet Take 1 tablet (81 mg total) by mouth daily.    . carvedilol (COREG) 25 MG tablet Take 1 tablet (25 mg total) by mouth 2 (two) times daily with a meal. 180 tablet 3  . chlorhexidine (PERIDEX) 0.12 % solution  Reported on 07/12/2015 - Use as directed  3  . cyclobenzaprine (FLEXERIL) 5 MG tablet Take 5 mg by mouth 3 (three) times daily as needed for muscle spasms. Reported on 05/18/2015    . ezetimibe (ZETIA) 10 MG tablet Take 1 tablet (10 mg total) by mouth daily. 90 tablet 3  . leflunomide (ARAVA) 20 MG tablet Take 20 mg by mouth daily.      . simvastatin (ZOCOR) 20 MG tablet Take 1 tablet (20 mg total) by mouth at bedtime. 90 tablet 3  . torsemide (DEMADEX) 20 MG tablet Take 1 tablet (20 mg total) by mouth daily. 90 tablet 3  . traMADol (ULTRAM) 50 MG tablet Take 1 tablet by mouth as needed for moderate pain (Take as directed).  Reported on 05/18/2015  2  . valsartan (DIOVAN) 160 MG tablet Take 1 tablet (160 mg total) by mouth 2 (two) times daily. 180 tablet 3   No current facility-administered medications for this visit.     Allergies  Allergen Reactions  . Atorvastatin Other (See Comments)    Muscle pain  . Crestor [Rosuvastatin Calcium] Other (See Comments)    Muscle pain  . Losartan Other (See Comments)    Muscle pain   . Telmisartan Other (See Comments)    Stomach ache    Social History   Social History  . Marital status: Single    Spouse name: N/A  . Number of children: N/A  . Years of education: N/A   Occupational History  . Not on file.   Social History Main Topics  . Smoking status: Former Smoker    Years: 47.00  . Smokeless tobacco: Never Used     Comment: QUIT 2009  . Alcohol use 0.0 oz/week     Comment: minimal   . Drug use: No     Comment: prior   . Sexual activity: Not on file   Other Topics Concern  . Not on file   Social History Narrative   Single; gets minimal exercise; 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 originally from [place]    Physical Exam: Vitals:   07/10/16 0840  BP: (!) 148/84  Pulse: 77  Weight: 168 lb (76.2 kg)  Height: 5\' 10"  (1.778 m)    GEN- The patient is thin appearing, alert and oriented x 3 today.  Not very pleasant today. Head- normocephalic, atraumatic Eyes-  Sclera clear, conjunctiva pink Ears- hearing intact Oropharynx- clear Neck- supple, no JVP Lungs- Clear to ausculation bilaterally, normal work of breathing Chest- ICD pocket is well healed Heart- Regular rate and rhythm, no murmurs, rubs or gallops, PMI not laterally displaced GI- soft, NT, ND, + BS Extremities- no clubbing, cyanosis, or edema  ICD interrogation- personally reviewed in detail today,  See PACEART report ekg today reveal sinus 77 bpm, PR 306 msec, IVCD, PVCs, Qtc 522 msec  Assessment and Plan:  1. VT Improved with recently increased amiodarone.  I have cautioned about  increasing amiodarone on his own.  I have advised that he reduce amiodarone back to 200mg  daily today.  Risks of this medicine discussed in detail today.  He states "I had rather be dead than have those palpitations". No driving (pt aware) Will schedule for echo, RHC/LHC (see below)  2. CAD/ischemic CM Given worsening SOB in the setting of recent VT, I worry about progressive CAD. I have therefore advised RHC/LHC at this time.  Risks of procedure discussed with the patient who wishes to proceed. Will obtain echo to evaluate for progressive  cardiomyopathy  3. HTN Stable No change required today  4. Abnormal TFTs I have been very clear that he should follow-up with his PCP regarding this.  The patient has recently had unstable arrhythmias.  He is at increased risk of hospitalization, decompensation, sudden death.  A high level of decision making was required  carelink Return to see me in 4 weeks   Thompson Grayer MD, [facility] 07/10/2016 9:09 AM

## 2016-07-12 NOTE — Progress Notes (Signed)
  Echocardiogram 2D Echocardiogram has been performed.  Nathan Pruitt 07/12/2016, 3:10 PM

## 2016-07-12 NOTE — Discharge Instructions (Signed)
Radial Site Care Refer to this sheet in the next few weeks. These instructions provide you with information about caring for yourself after your procedure. Your health care provider may also give you more specific instructions. Your treatment has been planned according to current medical practices, but problems sometimes occur. Call your health care provider if you have any problems or questions after your procedure. What can I expect after the procedure? After your procedure, it is typical to have the following:  Bruising at the radial site that usually fades within 1-2 weeks.  Blood collecting in the tissue (hematoma) that may be painful to the touch. It should usually decrease in size and tenderness within 1-2 weeks. Follow these instructions at home:  Take medicines only as directed by your health care provider.  Drink plenty of fluids.  You may shower 24-48 hours after the procedure or as directed by your health care provider. Remove the bandage (dressing) and gently wash the site with plain soap and water. Pat the area dry with a clean towel. Do not rub the site, because this may cause bleeding.  Do not take baths, swim, or use a hot tub until your health care provider approves.  Check your insertion site every day for redness, swelling, or drainage.  Do not apply powder or lotion to the site.  Do not flex or bend the affected arm for 24 hours or as directed by your health care provider.  Do not push or pull heavy objects with the affected arm for 24 hours or as directed by your health care provider.  Do not lift over 10 lb (4.5 kg) for 5 days after your procedure or as directed by your health care provider.  Ask your health care provider when it is okay to:  Return to work or school.  Resume usual physical activities or sports.  Resume sexual activity.  Do not drive home if you are discharged the same day as the procedure. Have someone else drive you.  You may drive 24  hours after the procedure unless otherwise instructed by your health care provider.  Do not operate machinery or power tools for 24 hours after the procedure.  If your procedure was done as an outpatient procedure, which means that you went home the same day as your procedure, a responsible adult should be with you for the first 24 hours after you arrive home.  Keep all follow-up visits as directed by your health care provider. This is important. Contact a health care provider if:  You have a fever.  You have chills.  You have increased bleeding from the radial site. Hold pressure on the site. Get help right away if:  You have unusual pain at the radial site.  You have redness, warmth, or swelling at the radial site.  You have drainage (other than a small amount of blood on the dressing) from the radial site.  The radial site is bleeding, and the bleeding does not stop after 30 minutes of holding steady pressure on the site.  Your arm or hand becomes pale, cool, tingly, or numb. This information is not intended to replace advice given to you by your health care provider. Make sure you discuss any questions you have with your health care provider. Document Released: 05/25/2010 Document Revised: 09/28/2015 Document Reviewed: 11/08/2013 Elsevier Interactive Patient Education  2017 [employer].

## 2016-07-15 ENCOUNTER — Encounter (HOSPITAL_COMMUNITY): Payer: Self-pay | Admitting: Cardiology

## 2016-07-15 ENCOUNTER — Telehealth (HOSPITAL_COMMUNITY): Payer: Self-pay | Admitting: *Deleted

## 2016-07-15 NOTE — Telephone Encounter (Signed)
Pt aware.

## 2016-07-15 NOTE — Telephone Encounter (Signed)
-----   Message from Larey Dresser, MD sent at 07/12/2016  6:01 PM EST ----- Please call and let Mr Hilbun know that EF is mildly lower than the past at 30-35%, likely due to occlusion of his obtuse marginal vessel.  Plan for CTO procedure (PCI) with Dr. Tamala Julian on Tuesday.

## 2016-07-16 ENCOUNTER — Encounter (HOSPITAL_COMMUNITY)
Admission: RE | Disposition: A | Discharge status: Home / Self Care | Payer: Self-pay | Source: Ambulatory Visit | Attending: Interventional Cardiology

## 2016-07-16 ENCOUNTER — Ambulatory Visit (HOSPITAL_COMMUNITY)
Admission: RE | Admit: 2016-07-16 | Discharge: 2016-07-17 | Disposition: A | Discharge status: Home / Self Care | Payer: Medicare Other | Source: Ambulatory Visit | Attending: Interventional Cardiology | Admitting: Interventional Cardiology

## 2016-07-16 ENCOUNTER — Encounter (HOSPITAL_COMMUNITY): Payer: Self-pay | Admitting: *Deleted

## 2016-07-16 DIAGNOSIS — Y840 Cardiac catheterization as the cause of abnormal reaction of the patient, or of later complication, without mention of misadventure at the time of the procedure: Secondary | ICD-10-CM | POA: Insufficient documentation

## 2016-07-16 DIAGNOSIS — Z955 Presence of coronary angioplasty implant and graft: Secondary | ICD-10-CM

## 2016-07-16 DIAGNOSIS — I472 Ventricular tachycardia, unspecified: Secondary | ICD-10-CM

## 2016-07-16 DIAGNOSIS — I5022 Chronic systolic (congestive) heart failure: Secondary | ICD-10-CM | POA: Diagnosis present

## 2016-07-16 DIAGNOSIS — I97630 Postprocedural hematoma of a circulatory system organ or structure following a cardiac catheterization: Secondary | ICD-10-CM | POA: Insufficient documentation

## 2016-07-16 DIAGNOSIS — E785 Hyperlipidemia, unspecified: Secondary | ICD-10-CM | POA: Insufficient documentation

## 2016-07-16 DIAGNOSIS — Z7982 Long term (current) use of aspirin: Secondary | ICD-10-CM | POA: Diagnosis not present

## 2016-07-16 DIAGNOSIS — N189 Chronic kidney disease, unspecified: Secondary | ICD-10-CM | POA: Diagnosis not present

## 2016-07-16 DIAGNOSIS — I130 Hypertensive heart and chronic kidney disease with heart failure and stage 1 through stage 4 chronic kidney disease, or unspecified chronic kidney disease: Secondary | ICD-10-CM | POA: Diagnosis not present

## 2016-07-16 DIAGNOSIS — I2582 Chronic total occlusion of coronary artery: Secondary | ICD-10-CM

## 2016-07-16 DIAGNOSIS — I2510 Atherosclerotic heart disease of native coronary artery without angina pectoris: Secondary | ICD-10-CM | POA: Diagnosis present

## 2016-07-16 DIAGNOSIS — I255 Ischemic cardiomyopathy: Secondary | ICD-10-CM | POA: Diagnosis present

## 2016-07-16 DIAGNOSIS — M069 Rheumatoid arthritis, unspecified: Secondary | ICD-10-CM | POA: Diagnosis not present

## 2016-07-16 DIAGNOSIS — Z95810 Presence of automatic (implantable) cardiac defibrillator: Secondary | ICD-10-CM | POA: Diagnosis not present

## 2016-07-16 DIAGNOSIS — F419 Anxiety disorder, unspecified: Secondary | ICD-10-CM | POA: Diagnosis not present

## 2016-07-16 DIAGNOSIS — Z87891 Personal history of nicotine dependence: Secondary | ICD-10-CM | POA: Insufficient documentation

## 2016-07-16 DIAGNOSIS — I10 Essential (primary) hypertension: Secondary | ICD-10-CM | POA: Diagnosis present

## 2016-07-16 DIAGNOSIS — I2584 Coronary atherosclerosis due to calcified coronary lesion: Secondary | ICD-10-CM | POA: Insufficient documentation

## 2016-07-16 DIAGNOSIS — I252 Old myocardial infarction: Secondary | ICD-10-CM | POA: Diagnosis not present

## 2016-07-16 HISTORY — PX: CORONARY CTO INTERVENTION: CATH118236

## 2016-07-16 HISTORY — DX: Low back pain: M54.5

## 2016-07-16 HISTORY — DX: Other chronic pain: G89.29

## 2016-07-16 HISTORY — DX: Low back pain, unspecified: M54.50

## 2016-07-16 HISTORY — DX: Presence of automatic (implantable) cardiac defibrillator: Z95.810

## 2016-07-16 HISTORY — DX: Migraine, unspecified, not intractable, without status migrainosus: G43.909

## 2016-07-16 HISTORY — DX: Rheumatoid arthritis, unspecified: M06.9

## 2016-07-16 LAB — BASIC METABOLIC PANEL
Anion gap: 5 (ref 5–15)
BUN: 15 mg/dL (ref 6–20)
CO2: 28 mmol/L (ref 22–32)
CREATININE: 1.61 mg/dL — AB (ref 0.61–1.24)
Calcium: 8.4 mg/dL — ABNORMAL LOW (ref 8.9–10.3)
Chloride: 105 mmol/L (ref 101–111)
GFR calc Af Amer: 48 mL/min — ABNORMAL LOW (ref >60)
GFR calc non Af Amer: 41 mL/min — ABNORMAL LOW (ref >60)
GLUCOSE: 104 mg/dL — AB (ref 65–99)
Potassium: 3.7 mmol/L (ref 3.5–5.1)
Sodium: 138 mmol/L (ref 135–145)

## 2016-07-16 LAB — POCT ACTIVATED CLOTTING TIME
ACTIVATED CLOTTING TIME: 263 s
Activated Clotting Time: 252 seconds
Activated Clotting Time: 257 seconds
Activated Clotting Time: 384 seconds
Activated Clotting Time: 230 seconds

## 2016-07-16 LAB — CBC
HCT: 32.9 % — ABNORMAL LOW (ref 39.0–52.0)
Hemoglobin: 10.9 g/dL — ABNORMAL LOW (ref 13.0–17.0)
MCH: 30.2 pg (ref 26.0–34.0)
MCHC: 33.1 g/dL (ref 30.0–36.0)
MCV: 91.1 fL (ref 78.0–100.0)
PLATELETS: 180 10*3/uL (ref 150–400)
RBC: 3.61 MIL/uL — ABNORMAL LOW (ref 4.22–5.81)
RDW: 14.7 % (ref 11.5–15.5)
WBC: 6.9 10*3/uL (ref 4.0–10.5)

## 2016-07-16 SURGERY — CORONARY CTO INTERVENTION
Anesthesia: LOCAL

## 2016-07-16 MED ORDER — SODIUM CHLORIDE 0.9% FLUSH: 3.0000 mL | Freq: Two times a day (BID) | INTRAVENOUS | Status: DC

## 2016-07-16 MED ORDER — SODIUM CHLORIDE 0.9 % WEIGHT BASED INFUSION: 1.0000 mL/kg/h | INTRAVENOUS | Status: DC

## 2016-07-16 MED ORDER — IOPAMIDOL (ISOVUE-370) INJECTION 76%
INTRAVENOUS | Status: AC
  Filled 2016-07-16: qty 100

## 2016-07-16 MED ORDER — LIDOCAINE HCL (PF) 1 % IJ SOLN
INTRAMUSCULAR | Status: AC
  Filled 2016-07-16: qty 30

## 2016-07-16 MED ORDER — AMIODARONE HCL 200 MG PO TABS
100.0000 mg | ORAL_TABLET | Freq: Every evening | ORAL | Status: DC
  Filled 2016-07-16: qty 1

## 2016-07-16 MED ORDER — SODIUM CHLORIDE 0.9% FLUSH: 3.0000 mL | INTRAVENOUS | Status: DC | PRN

## 2016-07-16 MED ORDER — TORSEMIDE 20 MG PO TABS
20.0000 mg | ORAL_TABLET | Freq: Every day | ORAL | Status: DC
  Administered 2016-07-16 – 2016-07-17 (×2): 20 mg via ORAL
  Filled 2016-07-16 (×2): qty 1

## 2016-07-16 MED ORDER — MIDAZOLAM HCL 2 MG/2ML IJ SOLN
INTRAMUSCULAR | Status: AC
  Filled 2016-07-16: qty 2

## 2016-07-16 MED ORDER — ANGIOPLASTY BOOK
Freq: Once | Status: AC
  Administered 2016-07-16: 21:00:00
  Filled 2016-07-16: qty 1

## 2016-07-16 MED ORDER — SIMVASTATIN 20 MG PO TABS
20.0000 mg | ORAL_TABLET | Freq: Every day | ORAL | Status: DC
  Administered 2016-07-16: 22:00:00 20 mg via ORAL
  Filled 2016-07-16: qty 1

## 2016-07-16 MED ORDER — CLOPIDOGREL BISULFATE 300 MG PO TABS
ORAL_TABLET | ORAL | Status: AC
  Filled 2016-07-16: qty 1

## 2016-07-16 MED ORDER — FENTANYL CITRATE (PF) 100 MCG/2ML IJ SOLN
INTRAMUSCULAR | Status: AC
  Filled 2016-07-16: qty 2

## 2016-07-16 MED ORDER — ASPIRIN 81 MG PO CHEW: 81.0000 mg | CHEWABLE_TABLET | ORAL | Status: DC

## 2016-07-16 MED ORDER — FENTANYL CITRATE (PF) 100 MCG/2ML IJ SOLN
INTRAMUSCULAR | Status: DC | PRN
  Administered 2016-07-16 (×2): 50 ug via INTRAVENOUS

## 2016-07-16 MED ORDER — LABETALOL HCL 5 MG/ML IV SOLN: 10.0000 mg | INTRAVENOUS | Status: AC | PRN

## 2016-07-16 MED ORDER — HEPARIN SODIUM (PORCINE) 1000 UNIT/ML IJ SOLN
INTRAMUSCULAR | Status: AC
  Filled 2016-07-16: qty 1

## 2016-07-16 MED ORDER — SODIUM CHLORIDE 0.9 % IV SOLN: 250.0000 mL | INTRAVENOUS | Status: DC | PRN

## 2016-07-16 MED ORDER — VERAPAMIL HCL 2.5 MG/ML IV SOLN
INTRAVENOUS | Status: DC | PRN
  Administered 2016-07-16: 13:00:00 via INTRA_ARTERIAL

## 2016-07-16 MED ORDER — NITROGLYCERIN 1 MG/10 ML FOR IR/CATH LAB
INTRA_ARTERIAL | Status: DC | PRN
  Administered 2016-07-16 (×3): 200 ug via INTRACORONARY

## 2016-07-16 MED ORDER — HEPARIN (PORCINE) IN NACL 2-0.9 UNIT/ML-% IJ SOLN
INTRAMUSCULAR | Status: DC | PRN
  Administered 2016-07-16: 1000 mL

## 2016-07-16 MED ORDER — CLOPIDOGREL BISULFATE 300 MG PO TABS
600.0000 mg | ORAL_TABLET | Freq: Once | ORAL | Status: AC
  Administered 2016-07-16: 600 mg via ORAL

## 2016-07-16 MED ORDER — VERAPAMIL HCL 2.5 MG/ML IV SOLN
INTRAVENOUS | Status: AC
  Filled 2016-07-16: qty 2

## 2016-07-16 MED ORDER — LIDOCAINE HCL (PF) 1 % IJ SOLN
INTRAMUSCULAR | Status: DC | PRN
  Administered 2016-07-16: 10 mL

## 2016-07-16 MED ORDER — CLOPIDOGREL BISULFATE 75 MG PO TABS
75.0000 mg | ORAL_TABLET | Freq: Every day | ORAL | Status: DC
  Administered 2016-07-17: 08:00:00 75 mg via ORAL
  Filled 2016-07-16: qty 1

## 2016-07-16 MED ORDER — ACETAMINOPHEN 325 MG PO TABS
650.0000 mg | ORAL_TABLET | Freq: Once | ORAL | Status: AC
  Administered 2016-07-16: 650 mg via ORAL
  Filled 2016-07-16: qty 2

## 2016-07-16 MED ORDER — CLOPIDOGREL BISULFATE 300 MG PO TABS: 600.0000 mg | ORAL_TABLET | Freq: Once | ORAL | Status: DC

## 2016-07-16 MED ORDER — ACETAMINOPHEN 325 MG PO TABS
ORAL_TABLET | ORAL | Status: AC
  Administered 2016-07-16: 650 mg via ORAL
  Filled 2016-07-16: qty 2

## 2016-07-16 MED ORDER — AMIODARONE HCL 200 MG PO TABS
100.0000 mg | ORAL_TABLET | Freq: Every evening | ORAL | Status: DC
  Administered 2016-07-16: 100 mg via ORAL

## 2016-07-16 MED ORDER — HEPARIN SODIUM (PORCINE) 1000 UNIT/ML IJ SOLN
INTRAMUSCULAR | Status: DC | PRN
  Administered 2016-07-16: 7500 [IU] via INTRAVENOUS
  Administered 2016-07-16: 1500 [IU] via INTRAVENOUS
  Administered 2016-07-16: 2000 [IU] via INTRAVENOUS
  Administered 2016-07-16 (×2): 1500 [IU] via INTRAVENOUS

## 2016-07-16 MED ORDER — ACETAMINOPHEN 325 MG PO TABS: 650.0000 mg | ORAL_TABLET | ORAL | Status: DC | PRN

## 2016-07-16 MED ORDER — TRAMADOL HCL 50 MG PO TABS: 50.0000 mg | ORAL_TABLET | Freq: Four times a day (QID) | ORAL | Status: DC | PRN

## 2016-07-16 MED ORDER — FLUTICASONE PROPIONATE 50 MCG/ACT NA SUSP
1.0000 | Freq: Every day | NASAL | Status: DC | PRN
  Filled 2016-07-16: qty 16

## 2016-07-16 MED ORDER — ASPIRIN EC 81 MG PO TBEC
81.0000 mg | DELAYED_RELEASE_TABLET | Freq: Every day | ORAL | Status: DC
  Administered 2016-07-17: 09:00:00 81 mg via ORAL
  Filled 2016-07-16: qty 1

## 2016-07-16 MED ORDER — ONDANSETRON HCL 4 MG/2ML IJ SOLN: 4.0000 mg | Freq: Four times a day (QID) | INTRAMUSCULAR | Status: DC | PRN

## 2016-07-16 MED ORDER — MELATONIN 3 MG PO TABS
9.0000 mg | ORAL_TABLET | Freq: Every day | ORAL | Status: DC | PRN
  Filled 2016-07-16: qty 3

## 2016-07-16 MED ORDER — ASPIRIN 81 MG PO CHEW: 81.0000 mg | CHEWABLE_TABLET | Freq: Every day | ORAL | Status: DC

## 2016-07-16 MED ORDER — HYDRALAZINE HCL 20 MG/ML IJ SOLN
5.0000 mg | INTRAMUSCULAR | Status: AC | PRN
  Administered 2016-07-16: 17:00:00 5 mg via INTRAVENOUS
  Filled 2016-07-16 (×2): qty 1

## 2016-07-16 MED ORDER — OXYCODONE-ACETAMINOPHEN 5-325 MG PO TABS
1.0000 | ORAL_TABLET | ORAL | Status: DC | PRN
  Administered 2016-07-16: 1 via ORAL
  Filled 2016-07-16: qty 1

## 2016-07-16 MED ORDER — AMIODARONE HCL 200 MG PO TABS
200.0000 mg | ORAL_TABLET | Freq: Every day | ORAL | Status: DC
  Administered 2016-07-17: 09:00:00 200 mg via ORAL
  Filled 2016-07-16: qty 1

## 2016-07-16 MED ORDER — IOPAMIDOL (ISOVUE-370) INJECTION 76%
INTRAVENOUS | Status: DC | PRN
  Administered 2016-07-16: 210 mL via INTRA_ARTERIAL

## 2016-07-16 MED ORDER — LEFLUNOMIDE 20 MG PO TABS
20.0000 mg | ORAL_TABLET | Freq: Every day | ORAL | Status: DC
  Administered 2016-07-17: 20 mg via ORAL
  Filled 2016-07-16: qty 1

## 2016-07-16 MED ORDER — IOPAMIDOL (ISOVUE-370) INJECTION 76%
INTRAVENOUS | Status: AC
  Filled 2016-07-16: qty 50

## 2016-07-16 MED ORDER — CLOPIDOGREL BISULFATE 300 MG PO TABS
ORAL_TABLET | ORAL | Status: AC
  Administered 2016-07-16: 600 mg via ORAL
  Filled 2016-07-16: qty 1

## 2016-07-16 MED ORDER — SODIUM CHLORIDE 0.9 % WEIGHT BASED INFUSION
0.7000 mL/kg/h | INTRAVENOUS | Status: DC
  Administered 2016-07-16: 0.7 mL/kg/h via INTRAVENOUS

## 2016-07-16 MED ORDER — CARVEDILOL 12.5 MG PO TABS
25.0000 mg | ORAL_TABLET | Freq: Two times a day (BID) | ORAL | Status: DC
  Administered 2016-07-16 – 2016-07-17 (×2): 25 mg via ORAL
  Filled 2016-07-16 (×2): qty 2

## 2016-07-16 MED ORDER — NITROGLYCERIN 1 MG/10 ML FOR IR/CATH LAB
INTRA_ARTERIAL | Status: AC
  Filled 2016-07-16: qty 10

## 2016-07-16 MED ORDER — SODIUM CHLORIDE 0.9 % WEIGHT BASED INFUSION
3.0000 mL/kg/h | INTRAVENOUS | Status: DC
  Administered 2016-07-16: 3 mL/kg/h via INTRAVENOUS

## 2016-07-16 MED ORDER — CYCLOBENZAPRINE HCL 5 MG PO TABS
5.0000 mg | ORAL_TABLET | Freq: Every day | ORAL | Status: DC | PRN
  Filled 2016-07-16: qty 1

## 2016-07-16 MED ORDER — MIDAZOLAM HCL 2 MG/2ML IJ SOLN
INTRAMUSCULAR | Status: DC | PRN
  Administered 2016-07-16 (×3): 1 mg via INTRAVENOUS

## 2016-07-16 MED ORDER — ARTIFICIAL TEARS OP OINT
1.0000 "application " | TOPICAL_OINTMENT | Freq: Every day | OPHTHALMIC | Status: DC | PRN
  Filled 2016-07-16: qty 3.5

## 2016-07-16 MED ORDER — EZETIMIBE 10 MG PO TABS
10.0000 mg | ORAL_TABLET | Freq: Every day | ORAL | Status: DC
  Administered 2016-07-17: 10 mg via ORAL
  Filled 2016-07-16: qty 1

## 2016-07-16 MED FILL — Nitroglycerin IV Soln 100 MCG/ML in D5W: INTRA_ARTERIAL | Qty: 10 | Status: AC

## 2016-07-16 SURGICAL SUPPLY — 28 items
BALLN EMERGE MR 2.0X20 (BALLOONS) ×2
BALLN MOZEC 1.5X9 (BALLOONS) ×2
BALLN ~~LOC~~ MOZEC 2.0X15 (BALLOONS) ×2
BALLOON EMERGE MR 2.0X20 (BALLOONS) ×1 IMPLANT
BALLOON MOZEC 1.5X9 (BALLOONS) ×1 IMPLANT
BALLOON ~~LOC~~ MOZEC 2.0X15 (BALLOONS) ×1 IMPLANT
CATH MICROGUIDE FINCRSS 150 CM (MICROCATHETER) ×1 IMPLANT
CATH VISTA GUIDE 6FR XB3.5 (CATHETERS) ×2 IMPLANT
COVER PRB 48X5XTLSCP FOLD TPE (BAG) ×1 IMPLANT
COVER PROBE 5X48 (BAG) ×1
DEVICE RAD COMP TR BAND LRG (VASCULAR PRODUCTS) ×2 IMPLANT
DEVICE TORQUE .014-.018 (MISCELLANEOUS) ×1 IMPLANT
GLIDESHEATH SLEND A-KIT 6F 22G (SHEATH) ×2 IMPLANT
GUIDEWIRE INQWIRE 1.5J.035X260 (WIRE) ×1 IMPLANT
INQWIRE 1.5J .035X260CM (WIRE) ×2
KIT ENCORE 26 ADVANTAGE (KITS) ×4 IMPLANT
KIT HEART LEFT (KITS) ×2 IMPLANT
MICROGUIDE FINECROSS 150 CM (MICROCATHETER) ×2
PACK CARDIAC CATHETERIZATION (CUSTOM PROCEDURE TRAY) ×2 IMPLANT
STENT RESOLUTE ONYX 2.25X18 (Permanent Stent) ×2 IMPLANT
STENT RESOLUTE ONYX 2.5X12 (Permanent Stent) ×2 IMPLANT
STENT RESOLUTE ONYX 2.5X26 (Permanent Stent) ×2 IMPLANT
TORQUE DEVICE .014-.018 (MISCELLANEOUS) ×2
TRANSDUCER W/STOPCOCK (MISCELLANEOUS) ×2 IMPLANT
TUBING CIL FLEX 10 FLL-RA (TUBING) ×2 IMPLANT
WIRE ASAHI FIELDER XT 300CM (WIRE) ×2 IMPLANT
WIRE ASAHI PROWATER 180CM (WIRE) ×2 IMPLANT
WIRE ASAHI PROWATER 300CM (WIRE) ×2 IMPLANT

## 2016-07-16 NOTE — H&P (View-Only) (Signed)
-  PCP:  Maryella Shivers, MD Primary Cardiologist:  Dr Aundra Dubin  The patient presents today for routine electrophysiology followup.  Since last being seen in our clinic, the patient reports doing reasonably well.  06/19/16 he developed VT storm for which he received ATP therapy.  His amiodarone was increased to 200mg  BID and he has done better since.  Last visit, I reduced amiodarone back to 200mg  daily.  He developed PVCs and thus increased it on his own back to 300mg  daily. He reports progressive SOB.  He has difficulty with light activities. Today, he denies symptoms of chest pain,  orthopnea, PND, lower extremity edema, dizziness, presyncope, syncope, or neurologic sequela.  He is very anxious and less pleasant today.  The patient feels that he is tolerating medications without difficulties and is otherwise without complaint today.   Past Medical History:  Diagnosis Date  . Abnormal PFTs 7/11   FVC 74%, FEV1 80%, ratio 75%, TLC 78%, DLCO 68%. this suggests a mild restrictive and obstructive defect. pt did have a response to bronchodilator. these PFTs were significantly better than the report from Dr. Alcide Clever in [place] done prior.   [name] Anxiety   . Atrial flutter ([place])    s/p isthmus ablation 5/10  . CAD (coronary artery disease)    hx of silent MI in 1993. likely an inferior MI. hx of 2D cardiogram in 3/09 showing EF of 40%. hx of myoview in [place] 3/09-nml. presented to [facility] 5/10 with VT and mildly elevated cardiac enzymes LHC (5/10): inferobasal dyskinesis with EF 35-40%. was chronic total occlusion of mid RCA with good collaterals. luminals LCA. this does not appear to be an acute cause of the 5/10 event  . Cancer ([place])    skin  . CHF (congestive heart failure) ([place])   . HLD (hyperlipidemia)   . HTN (hypertension)   . Ischemic cardiomyopathy    EF 35-40% by LV-gram 5/10 with inferobasal dyskinesis. echo 5/10 showed EF 40% w/mild LVH, no sig. MR, inferobasal and posterobasal akinesis. echo  (7/11): EF 50%, mild LVH, basal-mid inferoposterior akenesis  . PAD (peripheral artery disease) (HCC)    s/p L-to-R fem-fem bypass performed by Dr. Scot Dock at [facility] in 2009   . Rheumatoid arthritis(714.0)    on leflunomide  . Silent myocardial infarction 1993  . Tobacco abuse    47 pack year hx; quit 8/09  . 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 He has duel chamber Medtronic ICD   Past Surgical History:  Procedure Laterality Date  . ABLATION    . CARDIAC DEFIBRILLATOR PLACEMENT    . EP IMPLANTABLE DEVICE  09/22/08   Medtronic, ICD Model Number:  [id number], ICD Serial Number: [id number]  . FEMORAL ARTERY - FEMORAL ARTERY BYPASS GRAFT  march 2009   left to right bypass, first @ [facility], second at [facility] by Dr Scot Dock  . TONSILLECTOMY    . VASECTOMY      Current Outpatient Prescriptions  Medication Sig Dispense Refill  . amiodarone (PACERONE) 200 MG tablet Take 1 tablet (200 mg) by mouth in the morning and 1/2 tablet (100 mg) in the evening    . aspirin EC 81 MG tablet Take 1 tablet (81 mg total) by mouth daily.    . carvedilol (COREG) 25 MG tablet Take 1 tablet (25 mg total) by mouth 2 (two) times daily with a meal. 180 tablet 3  . chlorhexidine (PERIDEX) 0.12 % solution  Reported on 07/12/2015 - Use as directed  3  . cyclobenzaprine (FLEXERIL) 5 MG tablet Take 5 mg by mouth 3 (three) times daily as needed for muscle spasms. Reported on 05/18/2015    . ezetimibe (ZETIA) 10 MG tablet Take 1 tablet (10 mg total) by mouth daily. 90 tablet 3  . leflunomide (ARAVA) 20 MG tablet Take 20 mg by mouth daily.      . simvastatin (ZOCOR) 20 MG tablet Take 1 tablet (20 mg total) by mouth at bedtime. 90 tablet 3  . torsemide (DEMADEX) 20 MG tablet Take 1 tablet (20 mg total) by mouth daily. 90 tablet 3  . traMADol (ULTRAM) 50 MG tablet Take 1 tablet by mouth as needed for moderate pain (Take as directed).  Reported on 05/18/2015  2  . valsartan (DIOVAN) 160 MG tablet Take 1 tablet (160 mg total) by mouth 2 (two) times daily. 180 tablet 3   No current facility-administered medications for this visit.     Allergies  Allergen Reactions  . Atorvastatin Other (See Comments)    Muscle pain  . Crestor [Rosuvastatin Calcium] Other (See Comments)    Muscle pain  . Losartan Other (See Comments)    Muscle pain   . Telmisartan Other (See Comments)    Stomach ache    Social History   Social History  . Marital status: Single    Spouse name: N/A  . Number of children: N/A  . Years of education: N/A   Occupational History  . Not on file.   Social History Main Topics  . Smoking status: Former Smoker    Years: 47.00  . Smokeless tobacco: Never Used     Comment: QUIT 2009  . Alcohol use 0.0 oz/week     Comment: minimal   . Drug use: No     Comment: prior   . Sexual activity: Not on file   Other Topics Concern  . Not on file   Social History Narrative   Single; gets minimal exercise; 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 originally from [place]    Physical Exam: Vitals:   07/10/16 0840  BP: (!) 148/84  Pulse: 77  Weight: 168 lb (76.2 kg)  Height: 5\' 10"  (1.778 m)    GEN- The patient is thin appearing, alert and oriented x 3 today.  Not very pleasant today. Head- normocephalic, atraumatic Eyes-  Sclera clear, conjunctiva pink Ears- hearing intact Oropharynx- clear Neck- supple, no JVP Lungs- Clear to ausculation bilaterally, normal work of breathing Chest- ICD pocket is well healed Heart- Regular rate and rhythm, no murmurs, rubs or gallops, PMI not laterally displaced GI- soft, NT, ND, + BS Extremities- no clubbing, cyanosis, or edema  ICD interrogation- personally reviewed in detail today,  See PACEART report ekg today reveal sinus 77 bpm, PR 306 msec, IVCD, PVCs, Qtc 522 msec  Assessment and Plan:  1. VT Improved with recently increased amiodarone.  I have cautioned about  increasing amiodarone on his own.  I have advised that he reduce amiodarone back to 200mg  daily today.  Risks of this medicine discussed in detail today.  He states "I had rather be dead than have those palpitations". No driving (pt aware) Will schedule for echo, RHC/LHC (see below)  2. CAD/ischemic CM Given worsening SOB in the setting of recent VT, I worry about progressive CAD. I have therefore advised RHC/LHC at this time.  Risks of procedure discussed with the patient who wishes to proceed. Will obtain echo to evaluate for progressive  cardiomyopathy  3. HTN Stable No change required today  4. Abnormal TFTs I have been very clear that he should follow-up with his PCP regarding this.  The patient has recently had unstable arrhythmias.  He is at increased risk of hospitalization, decompensation, sudden death.  A high level of decision making was required  carelink Return to see me in 4 weeks   Thompson Grayer MD, [facility] 07/10/2016 9:09 AM

## 2016-07-16 NOTE — Interval H&P Note (Signed)
Cath Lab Visit (complete for each Cath Lab visit)  Clinical Evaluation Leading to the Procedure:   ACS: No.  Non-ACS:    Anginal Classification: CCS III  Anti-ischemic medical therapy: Minimal Therapy (1 class of medications)  Non-Invasive Test Results: No non-invasive testing performed  Prior CABG: No previous CABG      History and Physical Interval Note:  07/16/2016 12:11 PM  Nathan Pruitt  has presented today for surgery, with the diagnosis of cad  The various methods of treatment have been discussed with the patient and family. After consideration of risks, benefits and other options for treatment, the patient has consented to  Procedure(s): Coronary CTO Intervention (N/A) as a surgical intervention .  The patient's history has been reviewed, patient examined, no change in status, stable for surgery.  I have reviewed the patient's chart and labs.  Questions were answered to the patient's satisfaction.     Belva Crome III

## 2016-07-16 NOTE — Progress Notes (Signed)
Spoke w/ pt by phone.   Hx VT storm, amio dose w/ multiple adjustments. Pt got "flutters" on 200 mg qd, so increased it to 200 mg am, 100 mg pm. This has worked well for him.  Will add 100 mg amio qpm.  Pt also concerned that the valsartan was held. Advised him his Cr was up a little, will hold this post-cath and follow renal function.  Pt agreeable to this plan.  Rosaria Ferries, Hershal Coria 07/16/2016 5:04 PM Beeper [phone]

## 2016-07-17 ENCOUNTER — Encounter (HOSPITAL_COMMUNITY): Payer: Self-pay | Admitting: Interventional Cardiology

## 2016-07-17 ENCOUNTER — Ambulatory Visit: Payer: Medicare Other | Admitting: Family

## 2016-07-17 ENCOUNTER — Encounter: Payer: Self-pay | Admitting: Internal Medicine

## 2016-07-17 ENCOUNTER — Encounter (HOSPITAL_COMMUNITY): Payer: Medicare Other

## 2016-07-17 DIAGNOSIS — I130 Hypertensive heart and chronic kidney disease with heart failure and stage 1 through stage 4 chronic kidney disease, or unspecified chronic kidney disease: Secondary | ICD-10-CM | POA: Diagnosis not present

## 2016-07-17 DIAGNOSIS — I472 Ventricular tachycardia: Secondary | ICD-10-CM | POA: Diagnosis not present

## 2016-07-17 DIAGNOSIS — I2584 Coronary atherosclerosis due to calcified coronary lesion: Secondary | ICD-10-CM | POA: Diagnosis not present

## 2016-07-17 DIAGNOSIS — N189 Chronic kidney disease, unspecified: Secondary | ICD-10-CM | POA: Diagnosis not present

## 2016-07-17 DIAGNOSIS — I255 Ischemic cardiomyopathy: Secondary | ICD-10-CM | POA: Diagnosis not present

## 2016-07-17 DIAGNOSIS — I5022 Chronic systolic (congestive) heart failure: Secondary | ICD-10-CM | POA: Diagnosis not present

## 2016-07-17 DIAGNOSIS — I252 Old myocardial infarction: Secondary | ICD-10-CM | POA: Diagnosis not present

## 2016-07-17 DIAGNOSIS — I97630 Postprocedural hematoma of a circulatory system organ or structure following a cardiac catheterization: Secondary | ICD-10-CM | POA: Diagnosis not present

## 2016-07-17 DIAGNOSIS — E785 Hyperlipidemia, unspecified: Secondary | ICD-10-CM | POA: Diagnosis not present

## 2016-07-17 DIAGNOSIS — M069 Rheumatoid arthritis, unspecified: Secondary | ICD-10-CM | POA: Diagnosis not present

## 2016-07-17 DIAGNOSIS — I2510 Atherosclerotic heart disease of native coronary artery without angina pectoris: Secondary | ICD-10-CM | POA: Diagnosis not present

## 2016-07-17 DIAGNOSIS — F419 Anxiety disorder, unspecified: Secondary | ICD-10-CM | POA: Diagnosis not present

## 2016-07-17 LAB — CBC
HEMATOCRIT: 31.5 % — AB (ref 39.0–52.0)
Hemoglobin: 10.3 g/dL — ABNORMAL LOW (ref 13.0–17.0)
MCH: 30 pg (ref 26.0–34.0)
MCHC: 32.7 g/dL (ref 30.0–36.0)
MCV: 91.8 fL (ref 78.0–100.0)
PLATELETS: 159 10*3/uL (ref 150–400)
RBC: 3.43 MIL/uL — AB (ref 4.22–5.81)
RDW: 14.8 % (ref 11.5–15.5)
WBC: 8.6 10*3/uL (ref 4.0–10.5)

## 2016-07-17 LAB — BASIC METABOLIC PANEL
Anion gap: 7 (ref 5–15)
BUN: 17 mg/dL (ref 6–20)
CHLORIDE: 102 mmol/L (ref 101–111)
CO2: 27 mmol/L (ref 22–32)
CREATININE: 1.71 mg/dL — AB (ref 0.61–1.24)
Calcium: 8.1 mg/dL — ABNORMAL LOW (ref 8.9–10.3)
GFR calc non Af Amer: 38 mL/min — ABNORMAL LOW (ref >60)
GFR, EST AFRICAN AMERICAN: 45 mL/min — AB (ref >60)
Glucose, Bld: 130 mg/dL — ABNORMAL HIGH (ref 65–99)
POTASSIUM: 3.7 mmol/L (ref 3.5–5.1)
SODIUM: 136 mmol/L (ref 135–145)

## 2016-07-17 MED ORDER — CLOPIDOGREL BISULFATE 75 MG PO TABS: 75.0000 mg | ORAL_TABLET | Freq: Every day | ORAL | 3 refills | Status: DC

## 2016-07-17 MED ORDER — AMIODARONE HCL 200 MG PO TABS: ORAL_TABLET | ORAL | 0 refills | Status: DC

## 2016-07-17 NOTE — Discharge Summary (Signed)
Discharge Summary    Patient ID: Nathan Pruitt,  MRN: [id number], DOB/AGE: [birth year] 71 y.o.  Admit date: 07/16/2016 Discharge date: 07/17/2016  Primary Care Provider: HODGES,FRANCISCO Primary Cardiologist: Dr. Aundra Dubin   Discharge Diagnoses    Active Problems:   HYPERTENSION, BENIGN ESSENTIAL   Atherosclerosis of native coronary artery of native heart without angina pectoris   Cardiomyopathy, ischemic   Chronic systolic CHF (congestive heart failure) (HCC)   V tach (HCC)   Allergies Allergies  Allergen Reactions  . Atorvastatin Other (See Comments)    Muscle pain  . Crestor [Rosuvastatin Calcium] Other (See Comments)    Muscle pain  . Losartan Other (See Comments)    Muscle pain   . Telmisartan Other (See Comments)    Stomach ache    Diagnostic Studies/Procedures    Coronary CTO Intervention 07/16/16  Conclusion    Chronic total occlusion of the large first obtuse marginal branches on the anterior and mid lateral wall. Very faint left to left collaterals are noted.  Successful but complicated CTO of the first obtuse marginal placing 3 overlapping Resolute Onyx drug-eluting stents beyond the bifurcation in the mid vessel back to the ostium. Final balloon diameter at high pressure was 2.5 mm (The distal most stent was a 2.25 x 18 stent, overlapped with a 2.5 x 26 mm in the mid to proximal segment, overlapped with a 2.5 x 12 from proximal to ostial). Final high pressure balloon 2.5 mm at 14 atm except the very distal segment which was dilated at high pressure using a 2.0 mm balloon.  RECOMMENDATIONS:   Overnight stay.  Small hematoma superior to the radial access site. Watch closely for evidence of bleeding.  Plan discharge in a.m. if no complications.      History of Present Illness     71 yo with history of CAD, ischemic CMP recently was noted to have runs of VT terminated by ATP from his ICD.  He was brought in last week for RHC/LHC, showing total  occlusion of a large ramus (this was probably about a month old).  He was brought for scheduled  CTO 07/16/16.   Hospital Course     Consultants: None  1. CAD: S/p CTO  to ramus with DES x 3 to ramus. Ambulated without angina. Continue ASA, plavix, and statins.   2. Chronic systolic CHF: EF 78-46% on last echo.  Euvolemic on exam.  Continue home Coreg 25 mg bid. Held Valsartan post PCI in AM with large contrast load and slight rise in creatinine, plan to resume tonight.  Continue home torsemide.  Will likely transition him to [facility] as outpatient.   3. CKD: Creatinine mildly elevated at 1.7 today after contrast load yesterday.  As above, hold am valsartan and restart in pm.  4. Ventricular arrhythmias: Has ICD, on amiodarone. Continue taking 200 qam/100 qpm for one week and then resume decrease to amiodarone to 200 mg daily. No overnight event on telemetry.   The patient has been seen by Dr. Aundra Dubin today and deemed ready for discharge home. All follow-up appointments have been scheduled. Discharge medications are listed below.  _____________   Discharge Vitals Blood pressure (!) 158/57, pulse (!) 54, temperature 98.2 F (36.8 C), temperature source Oral, resp. rate 18, height _0  (1.778 m), weight 169 lb 12.1 oz (77 kg), SpO2 96 %.  Filed Weights   07/16/16 0828 07/17/16 0639  Weight: 167 lb (75.8 kg) 169 lb 12.1 oz (77 kg)  Labs & Radiologic Studies     CBC  Recent Labs  07/16/16 0903 07/17/16 0243  WBC 6.9 8.6  HGB 10.9* 10.3*  HCT 32.9* 31.5*  MCV 91.1 91.8  PLT 180 612   Basic Metabolic Panel  Recent Labs  07/16/16 0903 07/17/16 0243  NA 138 136  K 3.7 3.7  CL 105 102  CO2 28 27  GLUCOSE 104* 130*  BUN 15 17  CREATININE 1.61* 1.71*  CALCIUM 8.4* 8.1*   Liver Function Tests No results for input(s): AST, ALT, ALKPHOS, BILITOT, PROT, ALBUMIN in the last 72 hours. No results for input(s): LIPASE, AMYLASE in the last 72 hours. Cardiac Enzymes No results  for input(s): CKTOTAL, CKMB, CKMBINDEX, TROPONINI in the last 72 hours. BNP Invalid input(s): POCBNP D-Dimer No results for input(s): DDIMER in the last 72 hours. Hemoglobin A1C No results for input(s): HGBA1C in the last 72 hours. Fasting Lipid Panel No results for input(s): CHOL, HDL, LDLCALC, TRIG, CHOLHDL, LDLDIRECT in the last 72 hours. Thyroid Function Tests No results for input(s): TSH, T4TOTAL, T3FREE, THYROIDAB in the last 72 hours.  Invalid input(s): FREET3  No results found.  Disposition   Pt is being discharged home today in good condition.  Follow-up Plans & Appointments    Follow-up Information    Loralie Champagne, MD. Go on 07/31/2016.   Specialty:  Cardiology Why:  _0 :30am for post hospital follow up Contact information: [address] [place] [address] [phone]          Discharge Instructions    Amb Referral to Cardiac Rehabilitation    Complete by:  As directed    Referring to  program   Diagnosis:  Coronary Stents   Diet - low sodium heart healthy    Complete by:  As directed    Discharge instructions    Complete by:  As directed    No driving for 48 hours. No lifting over 5 lbs for 1 week. No sexual activity for 1 week. Keep procedure site clean & dry. If you notice increased pain, swelling, bleeding or pus, call/return!  You may shower, but no soaking baths/hot tubs/pools for 1 week.  Continue to take Amiodarone 270m in AM and 1032min PM for one week and reduce back to normal dose of amiodarone 2004maily.   Increase activity slowly    Complete by:  As directed       Discharge Medications   Discharge Medication List as of 07/17/2016  9:35 AM    START taking these medications   Details  clopidogrel (PLAVIX) 75 MG tablet Take 1 tablet (75 mg total) by mouth daily with breakfast. Please prescribe generic, Starting Thu 07/18/2016, Normal      CONTINUE these medications which have CHANGED   Details    amiodarone (PACERONE) 200 MG tablet Take 200m37m AM and 100mg64mPm until 07/24/16. Then 200mg 38my., No Print      CONTINUE these medications which have NOT CHANGED   Details  aspirin EC 81 MG tablet Take 1 tablet (81 mg total) by mouth daily., Starting 11/27/2010, Until Discontinued, Historical Med    carvedilol (COREG) 25 MG tablet Take 1 tablet (25 mg total) by mouth 2 (two) times daily with a meal., Starting Wed 06/26/2016, Normal    chlorhexidine (PERIDEX) 0.12 % solution Swish and rinse as directed, Historical Med    cyclobenzaprine (FLEXERIL) 5 MG tablet Take 5 mg by mouth daily as needed for muscle spasms. Reported  on 05/18/2015, Historical Med    ezetimibe (ZETIA) 10 MG tablet Take 1 tablet (10 mg total) by mouth daily., Starting Tue 12/19/2015, Normal    fluticasone (FLONASE) 50 MCG/ACT nasal spray Place 1-2 sprays into both nostrils daily as needed for allergies or rhinitis., Historical Med    leflunomide (ARAVA) 20 MG tablet Take 20 mg by mouth daily.  , Until Discontinued, Historical Med    Melatonin 10 MG TABS Take 10 mg by mouth daily as needed (sleep)., Historical Med    Polyethyl Glycol-Propyl Glycol (SYSTANE OP) Apply 1 drop to eye daily as needed (dry eyes)., Historical Med    simvastatin (ZOCOR) 20 MG tablet Take 1 tablet (20 mg total) by mouth at bedtime., Starting Mon 05/20/2016, Normal    torsemide (DEMADEX) 20 MG tablet Take 1 tablet (20 mg total) by mouth daily., Starting Fri 02/02/2016, Until Wed 07/10/2016, No Print    traMADol (ULTRAM) 50 MG tablet Take 50 mg by mouth as needed for moderate pain. Reported on 05/18/2015, Starting Tue 07/12/2014, Historical Med    valsartan (DIOVAN) 160 MG tablet Take 1 tablet (160 mg total) by mouth 2 (two) times daily., Starting Mon 07/08/2016, Normal          Outstanding Labs/Studies   BMET during post hospital follow up  Duration of Discharge Encounter   Greater than 30 minutes including physician  time.  Signed, Jennilee Demarco PA-C 07/17/2016, 11:32 AM

## 2016-07-17 NOTE — Care Management Note (Addendum)
Case Management Note  Patient Details  Name: Nathan Pruitt MRN: [id number] Date of Birth: 15-Jul-1945  Subjective/Objective:   S./p closure of CTO Ramus with 3 DES, will be on plavix, for dc today no needs.                  Action/Plan:   Expected Discharge Date:                  Expected Discharge Plan:  Home/Self Care  In-House Referral:     Discharge planning Services  CM Consult  Post Acute Care Choice:    Choice offered to:     DME Arranged:    DME Agency:     HH Arranged:    HH Agency:     Status of Service:  Completed, signed off  If discussed at [employer] of Stay Meetings, dates discussed:    Additional Comments:  Zenon Mayo, RN 07/17/2016, 8:56 AM

## 2016-07-17 NOTE — Progress Notes (Signed)
CARDIAC REHAB PHASE I   PRE:  Rate/Rhythm: 54 SB  BP:  Supine:   Sitting: 105/86  Standing:    SaO2: 98%RA  MODE:  Ambulation: 800 ft   POST:  Rate/Rhythm: 59 SB  BP:  Supine:   Sitting: 149/59  Standing:    SaO2: 99%RA 0910-1010 Pt walked 800 ft with steady gait. No CP. Pt stated he is not very active at home. Discussed walking for ex and CRP 2. Will refer to [facility] program but he stated he was not going to attend. Gave CHF booklet due to low EF. Reviewed 2000 mg sodium restriction and daily weights. Encouraged watching sodium and discussed some heart healthy food choices. Reviewed NTG use and the importance of plavix with stent. To recliner with call bell.    Graylon Good, RN BSN  07/17/2016 10:03 AM

## 2016-07-17 NOTE — Progress Notes (Signed)
TR BAND REMOVAL  LOCATION:    right radial  DEFLATED PER PROTOCOL:    Yes.    TIME BAND OFF / DRESSING APPLIED:    19:30   SITE UPON ARRIVAL:    Level 0  SITE AFTER BAND REMOVAL:    Level 0  CIRCULATION SENSATION AND MOVEMENT:    Within Normal Limits   Yes.    COMMENTS:   Post TR band instructions given. Pt tolerated well. 

## 2016-07-17 NOTE — Progress Notes (Signed)
Patient ID: Nathan Pruitt, male   DOB: 06-30-1945, 71 y.o.   MRN: [id number]   SUBJECTIVE: No complaints this morning, no chest pain.  Rare PVCs overnight.   Scheduled Meds: . amiodarone  100 mg Oral QPM  . amiodarone  100 mg Oral QPM  . amiodarone  200 mg Oral Daily  . aspirin EC  81 mg Oral Daily  . carvedilol  25 mg Oral BID WC  . clopidogrel  75 mg Oral Q breakfast  . ezetimibe  10 mg Oral Daily  . leflunomide  20 mg Oral Daily  . simvastatin  20 mg Oral QHS  . sodium chloride flush  3 mL Intravenous Q12H  . torsemide  20 mg Oral Daily   Continuous Infusions: . sodium chloride 0.7 mL/kg/hr (07/17/16 0500)   PRN Meds:.sodium chloride, acetaminophen, artificial tears, cyclobenzaprine, fluticasone, Melatonin, ondansetron (ZOFRAN) IV, oxyCODONE-acetaminophen, sodium chloride flush, traMADol    Vitals:   07/16/16 2000 07/16/16 2002 07/17/16 0639 07/17/16 0751  BP: (!) 168/57 (!) 168/57 (!) 164/61 (!) 158/57  Pulse: (!) 55 (!) 52 (!) 56 (!) 54  Resp: 16 17 16 15   Temp:  97.6 F (36.4 C) 98 F (36.7 C) 98.2 F (36.8 C)  TempSrc:  Oral Oral Oral  SpO2: 99% 99% 98% 96%  Weight:   169 lb 12.1 oz (77 kg)   Height:        Intake/Output Summary (Last 24 hours) at 07/17/16 0836 Last data filed at 07/17/16 0700  Gross per 24 hour  Intake          1916.85 ml  Output             2050 ml  Net          -133.15 ml    LABS: Basic Metabolic Panel:  Recent Labs  07/16/16 0903 07/17/16 0243  NA 138 136  K 3.7 3.7  CL 105 102  CO2 28 27  GLUCOSE 104* 130*  BUN 15 17  CREATININE 1.61* 1.71*  CALCIUM 8.4* 8.1*   Liver Function Tests: No results for input(s): AST, ALT, ALKPHOS, BILITOT, PROT, ALBUMIN in the last 72 hours. No results for input(s): LIPASE, AMYLASE in the last 72 hours. CBC:  Recent Labs  07/16/16 0903 07/17/16 0243  WBC 6.9 8.6  HGB 10.9* 10.3*  HCT 32.9* 31.5*  MCV 91.1 91.8  PLT 180 159   Cardiac Enzymes: No results for input(s): CKTOTAL,  CKMB, CKMBINDEX, TROPONINI in the last 72 hours. BNP: Invalid input(s): POCBNP D-Dimer: No results for input(s): DDIMER in the last 72 hours. Hemoglobin A1C: No results for input(s): HGBA1C in the last 72 hours. Fasting Lipid Panel: No results for input(s): CHOL, HDL, LDLCALC, TRIG, CHOLHDL, LDLDIRECT in the last 72 hours. Thyroid Function Tests: No results for input(s): TSH, T4TOTAL, T3FREE, THYROIDAB in the last 72 hours.  Invalid input(s): FREET3 Anemia Panel: No results for input(s): VITAMINB12, FOLATE, FERRITIN, TIBC, IRON, RETICCTPCT in the last 72 hours.  RADIOLOGY: No results found.  PHYSICAL EXAM General: NAD Neck: No JVD, no thyromegaly or thyroid nodule.  Lungs: Clear to auscultation bilaterally with normal respiratory effort. CV: Nondisplaced PMI.  Heart regular S1/S2, no S3/S4, no murmur.  No peripheral edema.  No carotid bruit.  Normal pedal pulses.  Abdomen: Soft, nontender, no hepatosplenomegaly, no distention.  Neurologic: Alert and oriented x 3.  Psych: Normal affect. Extremities: No clubbing or cyanosis.   TELEMETRY: Reviewed telemetry pt in NSR with occasional PVCs  ASSESSMENT AND PLAN:  71 yo with history of CAD, ischemic CMP recently was noted to have runs of VT terminated by ATP from his ICD.  He was brought in last week for RHC/LHC, showing total occlusion of a large ramus (this was probably about a month old).  He was brought back yesterday for CTO procedure with success opening of CTO with 3 DES.   1. CAD: S/p PCI to ramus yesterday, DES x 3 to ramus.   - Continue simvastatin/Zetia. - Will need long-term clopidogrel.  - Continue ASA 81.  2. Chronic systolic CHF: EF 20-60% on last echo.  Volume status looks ok on exam.  Continue home Coreg 25 mg bid.  Restart home valsartan this evening (held this morning with large contrast load and slight rise in creatinine).  Continue home torsemide.  Will likely transition him to [facility] as outpatient.  3. CKD:  Creatinine mildly elevated at 1.7 today after contrast load yesterday.  As above, hold am valsartan and restart in pm. 4. Ventricular arrhythmias: Has ICD, on amiodarone.  After a few days, needs to decrease amiodarone to 200 mg daily (taking 200 qam/100 qpm).  5. Disposition: May go home today.  Only med change is addition of clopidogrel 75 daily.  He needs followup with me in 2-3 weeks (overbook).   Loralie Champagne 07/17/2016 8:41 AM

## 2016-07-24 ENCOUNTER — Other Ambulatory Visit (HOSPITAL_COMMUNITY): Payer: Medicare Other

## 2016-07-26 ENCOUNTER — Encounter: Payer: Self-pay | Admitting: Family

## 2016-07-26 DIAGNOSIS — H5213 Myopia, bilateral: Secondary | ICD-10-CM | POA: Diagnosis not present

## 2016-07-29 DIAGNOSIS — Z79899 Other long term (current) drug therapy: Secondary | ICD-10-CM | POA: Diagnosis not present

## 2016-07-31 ENCOUNTER — Ambulatory Visit (HOSPITAL_COMMUNITY)
Admission: RE | Admit: 2016-07-31 | Discharge: 2016-07-31 | Disposition: A | Discharge status: Home / Self Care | Payer: Medicare Other | Source: Ambulatory Visit | Attending: Cardiology | Admitting: Cardiology

## 2016-07-31 ENCOUNTER — Encounter: Payer: Medicare Other | Admitting: Internal Medicine

## 2016-07-31 VITALS — BP 100/52 | HR 62 | Wt 169.0 lb

## 2016-07-31 DIAGNOSIS — E785 Hyperlipidemia, unspecified: Secondary | ICD-10-CM | POA: Diagnosis not present

## 2016-07-31 DIAGNOSIS — I472 Ventricular tachycardia, unspecified: Secondary | ICD-10-CM

## 2016-07-31 DIAGNOSIS — E784 Other hyperlipidemia: Secondary | ICD-10-CM | POA: Diagnosis not present

## 2016-07-31 DIAGNOSIS — Z95810 Presence of automatic (implantable) cardiac defibrillator: Secondary | ICD-10-CM | POA: Diagnosis not present

## 2016-07-31 DIAGNOSIS — I2510 Atherosclerotic heart disease of native coronary artery without angina pectoris: Secondary | ICD-10-CM | POA: Diagnosis not present

## 2016-07-31 DIAGNOSIS — I739 Peripheral vascular disease, unspecified: Secondary | ICD-10-CM | POA: Insufficient documentation

## 2016-07-31 DIAGNOSIS — N183 Chronic kidney disease, stage 3 (moderate): Secondary | ICD-10-CM | POA: Diagnosis not present

## 2016-07-31 DIAGNOSIS — I5022 Chronic systolic (congestive) heart failure: Secondary | ICD-10-CM | POA: Diagnosis not present

## 2016-07-31 DIAGNOSIS — Z7902 Long term (current) use of antithrombotics/antiplatelets: Secondary | ICD-10-CM | POA: Diagnosis not present

## 2016-07-31 DIAGNOSIS — I130 Hypertensive heart and chronic kidney disease with heart failure and stage 1 through stage 4 chronic kidney disease, or unspecified chronic kidney disease: Secondary | ICD-10-CM | POA: Diagnosis not present

## 2016-07-31 DIAGNOSIS — I255 Ischemic cardiomyopathy: Secondary | ICD-10-CM | POA: Insufficient documentation

## 2016-07-31 DIAGNOSIS — I252 Old myocardial infarction: Secondary | ICD-10-CM | POA: Insufficient documentation

## 2016-07-31 DIAGNOSIS — Z7982 Long term (current) use of aspirin: Secondary | ICD-10-CM | POA: Insufficient documentation

## 2016-07-31 DIAGNOSIS — Z79899 Other long term (current) drug therapy: Secondary | ICD-10-CM | POA: Diagnosis not present

## 2016-07-31 DIAGNOSIS — E7849 Other hyperlipidemia: Secondary | ICD-10-CM

## 2016-07-31 MED ORDER — AMIODARONE HCL 200 MG PO TABS: 200.0000 mg | ORAL_TABLET | Freq: Every day | ORAL | 3 refills | Status: DC

## 2016-07-31 NOTE — Progress Notes (Signed)
Patient ID: Nathan Pruitt, male   DOB: 12-Aug-1945, 71 y.o.   MRN: [id number]     Advanced Heart Failure Clinic Note   PCP: Dr. Maryella Shivers  71 yo with history of CAD, ischemic CMP, and VT s/p ICD placement.  Echo in 2/17 was stable compared to the past with EF 40% and regional WMAs. Echo in 07/2016 EF 30-35%.  Seen by Dr. Rayann Heman in early March and reported progressive fatigue as well as having more episodes of VT, he was referred for heart cath. Heart cath on 07/12/16 showed  Admitted 07/17/16 for CTO intervention to [facility].. Had successful DES x 3 by Dr. Tamala Julian.   He returns today for HF follow up. Feels well, no chest pain or SOB. Weighs daily, weights at home 162-165 pounds. Eating healthy, drinking less 2L a day. Can walk through out the grocery store without SOB, has a very steep driveway and walked to the top without SOB last night. Does feel occasional palpitations, but not frequent. Denies myalgias. Taking medications, has not taken any extra torsemide.   Optivol: well below threshold, activity about 1 hour, no VT/VF.   Labs (11/10): K 4.9, creatinine 1.4, LDL 70, HDL 31, LFTs normal Labs (3/11): K 4.4, creatinine 1.3, LDL 61, HDL 35 Labs (7/11): LDL 62, HDL 23 Labs (1/12): K 4.8, LFTs normal, creatinine 1.44, LDL 69, HDL 33 Labs (7/12): LFTs normal, LDL 81, HDL 36, HCT normal, K 4.3, creatinine 1.3, BNP 116, TSH normal Labs (1/13): LDL 39, HDl 31, LFTs normal, TSH  Labs (7/13): TSH normal, LFTs normal, LDL 69, HDL 40 Labs (8/13): K 4.5, creatinine 1.6 Labs (10/13): K 4.4, creatinine 1.4 Labs (3/14): LDL 108, HDL 30, LFTs normal Labs (9/14): K 4.2, creatinine 1.6, LFTs normal, LDL 114, TSH 10.6 (elevated), free T4/free T3 normal Labs (2/15): K 4.6, creatinine 1.5, LFTs normal, TSH 7.6 (elevated), free T4 and free T3 normal Labs (3/15): K 3.6, creatinine 1.5, LFTs normal, TSH normal Labs (9/15): K 4.3, creatinine 1.6, LFTs normal, TSH normal, LDL 115, HDL 24 Labs (10/16): K 4.5,  creatinine 1.5, LDL 90, HDL 35, hgb 12.8 Labs (11/16): Low free T3, normal TSH and free T4 Labs (12/16): K 5, creatinine 1.8, LFTs normal, TSH 8.6 (mildly elevated) Labs (1/17): Free T4/TSH/free T3 normal, LFTs normal Labs (2/17): K 4.4, creatinine 2.4, LDL 131, HDL 34 Labs (4/17): K 4.2, creatinine 1.3, LDL 67, HDL 35 Labs (9/17): K 4.9, creatinine 2.2, LDL 54, HDL 35, free T3 and T4 normal, hgb 12 Labs (3/18)  K 3.7, creatinine 1.71 Allergies (verified):  No Known Drug Allergies  Past Medical History: 1. Coronary artery disease.       a.     The patient reports history of silent MI in 1993.  This was likely an inferior MI (see below).       b.     The patient reports history of 2-D echocardiogram in March        2009, showing an EF of 40%.       c.     The patient reports history of Myoview in [facility] in March 2009, which        was per his report normal.       d.     The patient presented to [facility] in 5/10 with VT and mildly elevated cardiac enzymes.         LHC (5/10):  Inferobasal dyskinesis with EF 35-40%.  There was chronic total occlusion  of the         mid RCA with good collaterals.  Luminals LCA.  This did not appear to be an acute cause of the 5/10 event.  2. Hypertension.  3. Hyperlipidemia: Intolerant of simvastatin, Crestor, Lipitor, pravastatin. .  4. Remote tobacco abuse with 47-pack-year history, quitting in August 2009.  5. Peripheral arterial disease.       a.     Status post left-to-right fem-fem bypass performed in        [place] in March 2009.       b.     Status post redo left-to-right fem-fem bypass performed by        Dr. Scot Dock at [facility] in 2009.       c.     ABIs normal 3/15, ABIs normal 3/16, ABIs normal 3/17.  6. Rheumatoid arthritis, on leflunomide.  7.  Ischemic cardiomyopathy:  EF 35-40% by LV-gram 5/10 with inferobasal dyskinesis.  Echo 5/10 showed EF 40% with mild LVH, no significant MR, inferobasal and posterobasal akinesis.  Echo (7/11):  EF 50%, mild LVH, basal-mid inferoposterior akinesis.  Echo (7/12): EF 45-50% with basal anterolateral, basal posterior, and basal to mid inferior akinesis.  Echo (4/16): EF 40-45%, basal to mid inferolateral AK, basal inferior AK, basal to mid anterolateral HK.  Echo (2/17): EF 40% with basal to mid inferior akinesis, basal inferolateral aneurysm, mid inferolateral akinesis, basal anterolateral hypokinesis, normal RV size and systolic function, PASP 25 mmHg.  8.  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  He has a dual chamber Medtronic ICD.  9.  Atrial flutter:  Status post isthmus ablation 5/10.   10.  PFTs (7/11): FVC 74%, FEV1 80%, ratio 75%, TLC 78%, DLCO 68%.  This suggests a mild restrictive defect and a mild obstructive defect.  He did have response to bronchodilator. These PFTs were significantly better than the report from Dr. Alcide Clever in [place] done prior.  He had last PFTs in 2/12 with no significant change.  11.  Anxiety 12.  Chronic cough: No relief with change from ACEI to ARB or with trial of PPI.  13.  CKD: Stage 3.  14. 07/17/2016 S/P CTO to ramus DES x3.   Family History: Mother died in her late 6s with gastric cancer.  Father died in his late 6s with throat cancer and PVD.   He had a brother who died at 63 of an MI.   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 -47ppy hx, quit 2009.  Alcohol Use - yes-minimal Regular Exercise - yes Drug Use - no (prior)  Review of Systems        All systems reviewed and negative except as per HPI.   Current Outpatient Prescriptions  Medication Sig Dispense Refill  . amiodarone (PACERONE) 200 MG tablet Take 200mg  in AM and 100mg  in Pm until 07/24/16. Then 200mg  daily. 40 tablet 0  . aspirin EC 81 MG tablet Take 1 tablet (81 mg total) by mouth daily.    . carvedilol (COREG) 25 MG tablet Take 1 tablet (25 mg total) by  mouth 2 (two) times daily with a meal. 180 tablet 3  . chlorhexidine (PERIDEX) 0.12 % solution Swish and rinse as directed  3  . clopidogrel (PLAVIX) 75 MG tablet Take 1 tablet (75 mg total) by mouth daily with breakfast. Please prescribe generic 90 tablet 3  . cyclobenzaprine (FLEXERIL) 5  MG tablet Take 5 mg by mouth daily as needed for muscle spasms. Reported on 05/18/2015    . ezetimibe (ZETIA) 10 MG tablet Take 1 tablet (10 mg total) by mouth daily. 90 tablet 3  . fluticasone (FLONASE) 50 MCG/ACT nasal spray Place 1-2 sprays into both nostrils daily as needed for allergies or rhinitis.    [name] leflunomide (ARAVA) 20 MG tablet Take 20 mg by mouth daily.      . Melatonin 10 MG TABS Take 10 mg by mouth daily as needed (sleep).    Vladimir Faster Glycol-Propyl Glycol (SYSTANE OP) Apply 1 drop to eye daily as needed (dry eyes).    . simvastatin (ZOCOR) 20 MG tablet Take 1 tablet (20 mg total) by mouth at bedtime. 90 tablet 3  . torsemide (DEMADEX) 20 MG tablet Take 1 tablet (20 mg total) by mouth daily. 90 tablet 3  . traMADol (ULTRAM) 50 MG tablet Take 50 mg by mouth as needed for moderate pain. Reported on 05/18/2015  2  . valsartan (DIOVAN) 160 MG tablet Take 1 tablet (160 mg total) by mouth 2 (two) times daily. 180 tablet 3   No current facility-administered medications for this encounter.     There were no vitals filed for this visit. Wt Readings from Last 3 Encounters:  07/17/16 169 lb 12.1 oz (77 kg)  07/12/16 160 lb (72.6 kg)  07/10/16 168 lb (76.2 kg)     General:  Well developed male in NAD. Neck:  Neck supple, No JVP. No masses, thyromegaly or abnormal cervical nodes. Lungs:  Clear to ausculation in all lobes.  Heart:  PMI non displaced. Regular rate and rhythm, no murmurs, rubs or gallops.   Abdomen: Soft, NT, ND, no HSM. No bruits or masses. + Bowel sounds in all 4 quadrants.  Extremities:  No clubbing or cyanosis. No peripheral edema.  Neurologic:  Alert and oriented x 3. Psych:   Normal affect.  Assessment/Plan:  1. Chronic systolic CHF: Ischemic cardiomyopathy.  Medtronic ICD.  Stable echo with EF 30-35% in 07/2016. NYHA II.  - Volume status stable on exam. Verified with Optivol. - Continue Coreg 12.5 mg bid  - Would benefit by starting Entresto 49/51mg  BID, he is very hesitant about the cost, pharmacy is working with him, he would still like to think about it before he switches. For now, continue Valsartan 160mg  BID.  - Continue torsemide 20mg  daily.    - No spiro as he has been intolerant in the past.  2. CAD - Recent CTO intervention on 07/17/2016 x3 , denies chest pain. - Continue ASA + Plavix. Long term per Dr Aundra Dubin.   - Continue high intensity statin.    3. HLD   - Stable on Zetia and simvastatin  - LDL 67 in April 2017. Check at next visit.  4. Hx of VT.   -  On Amiodarone 200mg  daily.  -  Has ICD, followed by Dr. Rayann Heman.  - LFT's stable in Dec. 2017.  - Knows to get a yearly eye exam 5. PAD He denies claudication.  Normal ABIs in 3/17, followed by [facility].   Follow up 2 weeks with Dr Aundra Dubin. Reviewed BMET from 3/25 from PCP. Stable renal function.   Phelan Schadt Ninfa Meeker NpC-  07/31/2016  10:51 AM

## 2016-07-31 NOTE — Patient Instructions (Signed)
Routine lab work today. Will notify you of abnormal results, otherwise no news is good news!  No changes to medication today.  Refill of Amiodarone paper Rx provided.  Follow up as scheduled in 2-3 weeks. See next sheet for details.  Do the following things EVERYDAY: 1) Weigh yourself in the morning before breakfast. Write it down and keep it in a log. 2) Take your medicines as prescribed 3) Eat low salt foods-Limit salt (sodium) to 2000 mg per day.  4) Stay as active as you can everyday 5) Limit all fluids for the day to less than 2 liters

## 2016-07-31 NOTE — Progress Notes (Signed)
Patient had lab draw at [facility] on Monday 3/28 (CBC, BMET, BNP, TSH, LIPID) WBC 8.1 NA 140 K 4.7 CR 1.7 H/H 12.4/36.7 BUN 35 PBNP 298

## 2016-07-31 NOTE — Progress Notes (Signed)
Advanced Heart Failure Medication Review by a Pharmacist  Does the patient  feel that his/her medications are working for him/her?  yes  Has the patient been experiencing any side effects to the medications prescribed?  no  Does the patient measure his/her own blood pressure or blood glucose at home?  yes   Does the patient have any problems obtaining medications due to transportation or finances?   no  Understanding of regimen: good Understanding of indications: good Potential of compliance: good Patient understands to avoid NSAIDs. Patient understands to avoid decongestants.  Issues to address at subsequent visits: None   Pharmacist comments: Nathan Pruitt presents to Advanced HF Clinic today without a medication list but good recall. He endorses compliance with his medications. He requests a new rx for amiodarone (90 ds with 3 refills). Complains of nausea with starting Plavix, but will continue to take despite this. No other questions or concerns.   Belia Heman, PharmD PGY1 Pharmacy Resident [phone] (Pager) 07/31/2016 10:05 AM  Time with patient: 10 min Preparation and documentation time: 2 min Total time: 12 min

## 2016-08-01 DIAGNOSIS — Z79899 Other long term (current) drug therapy: Secondary | ICD-10-CM | POA: Diagnosis not present

## 2016-08-01 DIAGNOSIS — M25571 Pain in right ankle and joints of right foot: Secondary | ICD-10-CM | POA: Diagnosis not present

## 2016-08-01 DIAGNOSIS — M0589 Other rheumatoid arthritis with rheumatoid factor of multiple sites: Secondary | ICD-10-CM | POA: Diagnosis not present

## 2016-08-01 DIAGNOSIS — M150 Primary generalized (osteo)arthritis: Secondary | ICD-10-CM | POA: Diagnosis not present

## 2016-08-07 ENCOUNTER — Encounter: Payer: Self-pay | Admitting: Family

## 2016-08-07 ENCOUNTER — Ambulatory Visit (INDEPENDENT_AMBULATORY_CARE_PROVIDER_SITE_OTHER): Payer: Medicare Other | Admitting: Family

## 2016-08-07 ENCOUNTER — Ambulatory Visit (HOSPITAL_COMMUNITY)
Admission: RE | Admit: 2016-08-07 | Discharge: 2016-08-07 | Disposition: A | Discharge status: Home / Self Care | Payer: Medicare Other | Source: Ambulatory Visit | Attending: Family | Admitting: Family

## 2016-08-07 VITALS — BP 98/54 | HR 52 | Temp 97.0°F | Resp 16 | Ht 70.0 in | Wt 163.0 lb

## 2016-08-07 DIAGNOSIS — Z87891 Personal history of nicotine dependence: Secondary | ICD-10-CM

## 2016-08-07 DIAGNOSIS — I779 Disorder of arteries and arterioles, unspecified: Secondary | ICD-10-CM

## 2016-08-07 DIAGNOSIS — R0989 Other specified symptoms and signs involving the circulatory and respiratory systems: Secondary | ICD-10-CM | POA: Diagnosis not present

## 2016-08-07 NOTE — Patient Instructions (Signed)

## 2016-08-07 NOTE — Progress Notes (Signed)
[facility] [place]   CC: Follow up peripheral artery occlusive disease  History of Present Illness Nathan Pruitt is a 71 y.o. male who is s/p redo left to right fem-fem graft 12/30/07 by Dr. Scot Dock. He returns today for routine surveillance. He denies claudication symptoms, denies non healing wounds. He has right hip and knee pain with walking. He admits to not walking much, states he did not have any stamina.   Has a defibrillator in place, he denies history of stroke or TIA.  He had skin cancer removed from his head. He had 3 cardiac stents placed in March 2018, denies   He states he is rarely light-headed.   Pt Diabetic: No Pt smoker: former smoker, quit in 2010 when he had v-tach and ICD placed  Pt meds include: Statin :Yes ASA: Yes Other anticoagulants/antiplatelets: Plavix since cardiac stents placed March 2018     Past Medical History:  Diagnosis Date  . Abnormal PFTs 7/11   FVC 74%, FEV1 80%, ratio 75%, TLC 78%, DLCO 68%. this suggests a mild restrictive and obstructive defect. pt did have a response to bronchodilator. these PFTs were significantly better than the report from Dr. Alcide Clever in [place] done prior.   [name] AICD (automatic cardioverter/defibrillator) present   . Anxiety   . Atrial flutter ([place])    s/p isthmus ablation 5/10  . CAD (coronary artery disease)    hx of silent MI in 1993. likely an inferior MI. hx of 2D cardiogram in 3/09 showing EF of 40%. hx of myoview in [place] 3/09-nml. presented to [facility] 5/10 with VT and mildly elevated cardiac enzymes LHC (5/10): inferobasal dyskinesis with EF 35-40%. was chronic total occlusion of mid RCA with good collaterals. luminals LCA. this does not appear to be an acute cause of the 5/10 event  . Cancer ([place])    skin  . CHF (congestive heart failure) ([place])   . Chronic lower back pain   . HLD (hyperlipidemia)   . HTN (hypertension)   . Ischemic cardiomyopathy    EF 35-40% by LV-gram 5/10 with  inferobasal dyskinesis. echo 5/10 showed EF 40% w/mild LVH, no sig. MR, inferobasal and posterobasal akinesis. echo (7/11): EF 50%, mild LVH, basal-mid inferoposterior akenesis  . Migraine    "visual migraine; maybe 12/year" (07/16/2016)  . PAD (peripheral artery disease) (HCC)    s/p L-to-R fem-fem bypass performed by Dr. Scot Dock at [facility] in 2009   . Rheumatoid arthritis (HCC)    on leflunomide  . Silent myocardial infarction 1993   "silent"  . Tobacco abuse    47 pack year hx; quit 8/09  . Ventricular tachycardia ([place])    likely scar-mediated. 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 He has duel chamber Medtronic ICD    Social History Social History  Substance Use Topics  . Smoking status: Former Smoker    Packs/day: 1.00    Years: 47.00    Quit date: 2009  . Smokeless tobacco: Never Used  . Alcohol use 0.0 oz/week     Comment: 07/15/2016 "nothing for the last couple years"    Family History Family History  Problem Relation Age of Onset  . Gastric cancer Mother   . Throat cancer Father   . Peripheral vascular disease Father   . Heart attack Brother 34    Past Surgical History:  Procedure Laterality Date  . CARDIAC DEFIBRILLATOR PLACEMENT  09/22/2008  . CATARACT EXTRACTION W/ INTRAOCULAR LENS  IMPLANT, BILATERAL Bilateral 05/2016 -  06/2016  . CORONARY CTO INTERVENTION  07/16/2016  . CORONARY CTO INTERVENTION N/A 07/16/2016   Procedure: Coronary CTO Intervention;  Surgeon: Belva Crome, MD;  Location: [place] [facility];  Service: Cardiovascular;  Laterality: N/A;  . EP IMPLANTABLE DEVICE  09/22/08   Medtronic, ICD Model Number:  D274DRG, ICD Serial Number: [id number]  . FEMORAL ARTERY - FEMORAL ARTERY BYPASS GRAFT  march 2009   left to right bypass, first @ [facility], second at [facility] by Dr Scot Dock  . RIGHT/LEFT HEART CATH AND CORONARY ANGIOGRAPHY N/A 07/12/2016   Procedure: Right/Left Heart Cath and Coronary Angiography;  Surgeon: Larey Dresser, MD;  Location: [place] [facility];  Service: Cardiovascular;  Laterality: N/A;  . TONSILLECTOMY    . VASECTOMY    . VENTRICULAR ABLATION SURGERY  09/2008    Allergies  Allergen Reactions  . Atorvastatin Other (See Comments)    Muscle pain  . Crestor [Rosuvastatin Calcium] Other (See Comments)    Muscle pain  . Losartan Other (See Comments)    Muscle pain   . Telmisartan Other (See Comments)    Stomach ache    Current Outpatient Prescriptions  Medication Sig Dispense Refill  . amiodarone (PACERONE) 200 MG tablet Take 1 tablet (200 mg total) by mouth daily. 90 tablet 3  . aspirin EC 81 MG tablet Take 1 tablet (81 mg total) by mouth daily.    . carvedilol (COREG) 25 MG tablet Take 1 tablet (25 mg total) by mouth 2 (two) times daily with a meal. 180 tablet 3  . chlorhexidine (PERIDEX) 0.12 % solution Swish and rinse as directed  3  . clopidogrel (PLAVIX) 75 MG tablet Take 1 tablet (75 mg total) by mouth daily with breakfast. Please prescribe generic 90 tablet 3  . ezetimibe (ZETIA) 10 MG tablet Take 1 tablet (10 mg total) by mouth daily. 90 tablet 3  . fluticasone (FLONASE) 50 MCG/ACT nasal spray Place 1-2 sprays into both nostrils daily as needed for allergies or rhinitis.    [name] leflunomide (ARAVA) 20 MG tablet Take 20 mg by mouth daily.      . Melatonin 10 MG TABS Take 10 mg by mouth daily as needed (sleep).    Vladimir Faster Glycol-Propyl Glycol (SYSTANE OP) Apply 1 drop to eye daily as needed (dry eyes).    . simvastatin (ZOCOR) 20 MG tablet Take 1 tablet (20 mg total) by mouth at bedtime. 90 tablet 3  . valsartan (DIOVAN) 160 MG tablet Take 1 tablet (160 mg total) by mouth 2 (two) times daily. 180 tablet 3  . torsemide (DEMADEX) 20 MG tablet Take 1 tablet (20 mg total) by mouth daily. 90 tablet 3   No current facility-administered medications for this visit.     ROS: See HPI for pertinent positives and negatives.   Physical Examination  Vitals:   08/07/16 0937  BP:  (!) 98/54  Pulse: (!) 52  Resp: 16  Temp: 97 F (36.1 C)  TempSrc: Oral  SpO2: 96%  Weight: 163 lb (73.9 kg)  Height: 5\' 10"  (1.778 m)   Body mass index is 23.39 kg/m.  General: A&O x 3, WDWN . Gait: normal Eyes: PERRLA. Pulmonary: CTAB, respirations are non labored Cardiac: regular rhythm, bradycardic, no detected murmur. Defibrillator palpated left side of chest.     Carotid Bruits Left Right   Negative Negative   Aorta is prominently palpable. Radial pulses: 2+ palpable and =   VASCULAR EXAM: Extremities without ischemic  changes  without Gangrene; without open wounds. Palpable fem-fem graft pulse.     LE Pulses LEFT RIGHT   FEMORAL  2+palpable  2+palpable    POPLITEAL not palpable  not palpable   POSTERIOR TIBIAL  2+palpable  1+ palpable   DORSALIS PEDIS  ANTERIOR TIBIAL 1+ palpable 1+ palpable   Abdomen: soft, NT, no palpable masses. Skin: no rashes, no ulcers, general pallor. Musculoskeletal: no muscle wasting or atrophy. Neurologic: A&O X 3; Appropriate Affect, sensation is normal; MOTOR FUNCTION: moving all extremities equally, motor strength 5/5 throughout. Speech is fluent/normal. CN 2-12 intact.      12-10-2007 CT ANGIOGRAPHY OF ABDOMINAL AORTA WITH ILIOFEMORAL RUNOFF:   Aorta: Proximal aorta is normally patent.  The celiac axis, superior mesenteric artery and bilateral single renal arteries are widely patent.  Atherosclerotic disease is present in the distal aorta without evidence of aneurysm or aortic stenosis.  There is a moderate to severe stenosis at the origin of the inferior mesenteric artery.  The IMA is open, however.   There is a diverticulum of the posterior stomach at the level of the  fundus containing fluid.  This shows no surrounding inflammation and does not appear to represent a inactive area of ulceration.  The kidneys appear unremarkable.  No other incidental findings in the abdomen.   Right Lower Extermity: There is a long segment chronic occlusion of right lower extremity inflow beginning at the origin of the common iliac artery and extending to the level of the distal external iliac artery/common femoral artery junction at the inguinal ligament.  At this level, there is reconstitution of the common femoral artery by patent inflow via inferior epigastric and circumflex iliac collaterals.   A left to right femoral to femoral conduit is completely thrombosed.  Below level of reconstitution, the common femoral artery shows mild disease.  Below the graft anastomosis, the femoral bifurcation is widely patent.  Scattered disease is present in the right SFA without significant stenosis.  Popliteal artery is small in caliber but shows no significant stenosis.  Three-vessel runoff is demonstrated into the foot with mild disease of the proximal tibioperoneal trunk.   Left Lower Extremity: Left common iliac artery demonstrates mild eccentric plaque without significant stenosis.  The external iliac artery shows eccentric plaque causing approximately 25 - 30% stenosis in its proximal to mid segment.  Common femoral artery shows mild disease.  Origin of the femoral to femoral conduit graft is occluded.  Scattered atherosclerotic plaque is seen in the left SFA without significant stenosis.  The left popliteal artery is widely patent.  Three-vessel runoff is demonstrated into the left foot.   IMPRESSION: Chronic right iliac arterial occlusion spanning from the origin of the common iliac artery to the inguinal ligament.  The left to right femoral to femoral bypass graft is also completely occluded. Reconstituted right external iliac/common femoral arterial junction by  collaterals.  Arteries below this level demonstrate no significant disease.  No significant left lower extremity iliac inflow disease identified.  The left leg does not demonstrate any critical disease.    ASSESSMENT: Nathan Pruitt is a 71 y.o. male who is s/p redo left to right fem-fem graft 12/30/07. He has no claudication symptoms, no signs of ischemia in his feet/legs. Bilateral pedal pulses are palpable.    ABI's and TBI's remain normal with tri and biphasic waveforms.   He has intentionally lost about 50 pounds.  I offered that the pt may return in the next few weeks to evaluate  his prominent abdominal aortic pulse by duplex, bur he declined, states he will have this checked in a year when he returns for ABI.  He has no family history of aneurysms that he is aware, he has no back pain nor abdominal pain. CTA abd/pelvis in 2009 just prior to the redo of his left to right fem-fem bypass graft demonstrated no abdominal aortic aneurysm, see results above.    PLAN:  Graduated walking program discussed and how to achieve.  Based on the patient's vascular studies and examination, pt will return to clinic in 1 year with ABI's. I advised him to notify us if he develops concerns re the circulation in his legs.  I discussed in depth with the patient the nature of atherosclerosis, and emphasized the importance of maximal medical management including strict control of blood pressure, blood glucose, and lipid levels, obtaining regular exercise, and continued cessation of smoking.  The patient is aware that without maximal medical management the underlying atherosclerotic disease process will progress, limiting the benefit of any interventions.  The patient was given information about PAD including signs, symptoms, treatment, what symptoms should prompt the patient to seek immediate medical care, and risk reduction measures to take.  Clemon Chambers, RN, MSN, FNP-C [facility] Phone: [phone]  Clinic MD: Early  08/07/16 9:49 AM

## 2016-08-08 NOTE — Addendum Note (Signed)
Addended by: Lianne Cure A on: 08/08/2016 09:40 AM   Modules accepted: Orders

## 2016-08-14 ENCOUNTER — Ambulatory Visit (INDEPENDENT_AMBULATORY_CARE_PROVIDER_SITE_OTHER): Payer: Medicare Other | Admitting: Internal Medicine

## 2016-08-14 ENCOUNTER — Encounter: Payer: Medicare Other | Admitting: Internal Medicine

## 2016-08-14 ENCOUNTER — Encounter: Payer: Self-pay | Admitting: Internal Medicine

## 2016-08-14 VITALS — BP 108/78 | HR 51 | Ht 70.0 in | Wt 172.0 lb

## 2016-08-14 DIAGNOSIS — I5022 Chronic systolic (congestive) heart failure: Secondary | ICD-10-CM | POA: Diagnosis not present

## 2016-08-14 DIAGNOSIS — R002 Palpitations: Secondary | ICD-10-CM | POA: Diagnosis not present

## 2016-08-14 DIAGNOSIS — I255 Ischemic cardiomyopathy: Secondary | ICD-10-CM | POA: Diagnosis not present

## 2016-08-14 DIAGNOSIS — I779 Disorder of arteries and arterioles, unspecified: Secondary | ICD-10-CM

## 2016-08-14 DIAGNOSIS — I472 Ventricular tachycardia, unspecified: Secondary | ICD-10-CM

## 2016-08-14 DIAGNOSIS — I2589 Other forms of chronic ischemic heart disease: Secondary | ICD-10-CM

## 2016-08-14 LAB — CUP PACEART INCLINIC DEVICE CHECK
Brady Statistic AP VS Percent: 31 %
Brady Statistic AS VP Percent: 0.08 %
Brady Statistic AS VS Percent: 68.77 %
Brady Statistic RA Percent Paced: 30.31 %
Date Time Interrogation Session: 20180411100117
Eval Rhythm: 1
HighPow Impedance: 39 Ohm
HighPow Impedance: 53 Ohm
Implantable Lead Implant Date: 20100520
Implantable Lead Implant Date: 20100520
Implantable Lead Location: 753859
Implantable Lead Model: 6947
Lead Channel Impedance Value: 380 Ohm
Lead Channel Pacing Threshold Amplitude: 0.5 V
Lead Channel Pacing Threshold Pulse Width: 0.4 ms
Lead Channel Sensing Intrinsic Amplitude: 1.875 mV
Lead Channel Setting Pacing Amplitude: 2 V
Lead Channel Setting Pacing Amplitude: 2.5 V
Lead Channel Setting Pacing Pulse Width: 0.4 ms
MDC IDC LEAD LOCATION: 753860
MDC IDC MSMT BATTERY VOLTAGE: 2.62 V
MDC IDC MSMT LEADCHNL RV IMPEDANCE VALUE: 323 Ohm
MDC IDC MSMT LEADCHNL RV PACING THRESHOLD AMPLITUDE: 1.5 V
MDC IDC MSMT LEADCHNL RV PACING THRESHOLD PULSEWIDTH: 0.4 ms
MDC IDC MSMT LEADCHNL RV SENSING INTR AMPL: 6.625 mV
MDC IDC PG IMPLANT DT: 20100520
MDC IDC SET LEADCHNL RV SENSING SENSITIVITY: 0.3 mV
MDC IDC STAT BRADY AP VP PERCENT: 0.15 %
MDC IDC STAT BRADY RV PERCENT PACED: 0.28 %

## 2016-08-14 NOTE — Addendum Note (Signed)
Addended by: Eulis Foster on: 08/14/2016 09:57 AM   Modules accepted: Orders

## 2016-08-14 NOTE — Addendum Note (Signed)
Addended by: Eulis Foster on: 08/14/2016 09:56 AM   Modules accepted: Orders

## 2016-08-14 NOTE — Progress Notes (Signed)
-  PCP:  Maryella Shivers, MD Primary Cardiologist:  Dr Aundra Dubin  The patient presents today for routine electrophysiology followup.  Since last being seen in our clinic, the patient reports doing reasonably well.  He is recovering from recent PCI for CTO of RCA.   No further VT.  Denies CP.  SOB is stable.  Today, he denies symptoms of orthopnea, PND, lower extremity edema, dizziness, presyncope, syncope, or neurologic sequela.  The patient feels that he is tolerating medications without difficulties and is otherwise without complaint today.   Past Medical History:  Diagnosis Date  . Abnormal PFTs 7/11   FVC 74%, FEV1 80%, ratio 75%, TLC 78%, DLCO 68%. this suggests a mild restrictive and obstructive defect. pt did have a response to bronchodilator. these PFTs were significantly better than the report from Dr. Alcide Clever in [place] done prior.   [name] AICD (automatic cardioverter/defibrillator) present   . Anxiety   . Atrial flutter ([place])    s/p isthmus ablation 5/10  . CAD (coronary artery disease)    hx of silent MI in 1993. likely an inferior MI. hx of 2D cardiogram in 3/09 showing EF of 40%. hx of myoview in [place] 3/09-nml. presented to [facility] 5/10 with VT and mildly elevated cardiac enzymes LHC (5/10): inferobasal dyskinesis with EF 35-40%. was chronic total occlusion of mid RCA with good collaterals. luminals LCA. this does not appear to be an acute cause of the 5/10 event  . Cancer ([place])    skin  . CHF (congestive heart failure) ([place])   . Chronic lower back pain   . HLD (hyperlipidemia)   . HTN (hypertension)   . Ischemic cardiomyopathy    EF 35-40% by LV-gram 5/10 with inferobasal dyskinesis. echo 5/10 showed EF 40% w/mild LVH, no sig. MR, inferobasal and posterobasal akinesis. echo (7/11): EF 50%, mild LVH, basal-mid inferoposterior akenesis  . Migraine    "visual migraine; maybe 12/year" (07/16/2016)  . PAD (peripheral artery disease) (H[facility])    s/p L-to-R fem-fem bypass performed by Dr. Scot Dock  at [facility] in 2009   . Rheumatoid arthritis (H[facility])    on leflunomide  . Silent myocardial infarction 1993   "silent"  . Tobacco abuse    47 pack year hx; quit 8/09  . Ventricular tachycardia ([place])    likely scar-mediated. 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 He has duel chamber Medtronic ICD   Past Surgical History:  Procedure Laterality Date  . CARDIAC DEFIBRILLATOR PLACEMENT  09/22/2008  . CATARACT EXTRACTION W/ INTRAOCULAR LENS  IMPLANT, BILATERAL Bilateral 05/2016 - 06/2016  . CORONARY CTO INTERVENTION  07/16/2016  . CORONARY CTO INTERVENTION N/A 07/16/2016   Procedure: Coronary CTO Intervention;  Surgeon: Belva Crome, MD;  Location: [place] [facility];  Service: Cardiovascular;  Laterality: N/A;  . EP IMPLANTABLE DEVICE  09/22/08   Medtronic, ICD Model Number:  [id number], ICD Serial Number: [id number]  . FEMORAL ARTERY - FEMORAL ARTERY BYPASS GRAFT  march 2009   left to right bypass, first @ [facility], second at [facility] by Dr Scot Dock  . RIGHT/LEFT HEART CATH AND CORONARY ANGIOGRAPHY N/A 07/12/2016   Procedure: Right/Left Heart Cath and Coronary Angiography;  Surgeon: Larey Dresser, MD;  Location: [place] [facility];  Service: Cardiovascular;  Laterality: N/A;  . TONSILLECTOMY    . VASECTOMY    . VENTRICULAR ABLATION SURGERY  09/2008    Current Outpatient Prescriptions  Medication Sig Dispense Refill  . amiodarone (PACERONE) 200 MG tablet  Take 1 tablet (200 mg total) by mouth daily. 90 tablet 3  . aspirin EC 81 MG tablet Take 1 tablet (81 mg total) by mouth daily.    . carvedilol (COREG) 25 MG tablet Take 1 tablet (25 mg total) by mouth 2 (two) times daily with a meal. 180 tablet 3  . chlorhexidine (PERIDEX) 0.12 % solution Swish and rinse as directed  3  . clopidogrel (PLAVIX) 75 MG tablet Take 1 tablet (75 mg total) by mouth daily with breakfast. Please prescribe generic 90 tablet 3  . ezetimibe (ZETIA) 10 MG tablet Take 1 tablet (10 mg total)  by mouth daily. 90 tablet 3  . fluticasone (FLONASE) 50 MCG/ACT nasal spray Place 1-2 sprays into both nostrils daily as needed for allergies or rhinitis.    [name] leflunomide (ARAVA) 20 MG tablet Take 20 mg by mouth daily.      . Melatonin 10 MG TABS Take 10 mg by mouth daily as needed (sleep).    Vladimir Faster Glycol-Propyl Glycol (SYSTANE OP) Apply 1 drop to eye daily as needed (dry eyes).    . simvastatin (ZOCOR) 20 MG tablet Take 1 tablet (20 mg total) by mouth at bedtime. 90 tablet 3  . valsartan (DIOVAN) 160 MG tablet Take 1 tablet (160 mg total) by mouth 2 (two) times daily. 180 tablet 3  . torsemide (DEMADEX) 20 MG tablet Take 1 tablet (20 mg total) by mouth daily. 90 tablet 3   No current facility-administered medications for this visit.     Allergies  Allergen Reactions  . Atorvastatin Other (See Comments)    Muscle pain  . Crestor [Rosuvastatin Calcium] Other (See Comments)    Muscle pain  . Losartan Other (See Comments)    Muscle pain   . Telmisartan Other (See Comments)    Stomach ache    Social History   Social History  . Marital status: Single    Spouse name: N/A  . Number of children: N/A  . Years of education: N/A   Occupational History  . Not on file.   Social History Main Topics  . Smoking status: Former Smoker    Packs/day: 1.00    Years: 47.00    Quit date: 2009  . Smokeless tobacco: Never Used  . Alcohol use 0.0 oz/week     Comment: 07/15/2016 "nothing for the last couple years"  . Drug use: No     Comment: prior   . Sexual activity: Not on file   Other Topics Concern  . 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  more pleasant today. Head- normocephalic,  atraumatic Eyes-  Sclera clear, conjunctiva pink Ears- hearing intact Oropharynx- clear Neck- supple, no JVP Lungs- Clear to ausculation bilaterally, normal work of breathing Chest- ICD pocket is well healed Heart- Regular rate and rhythm  GI- soft, NT, ND, + BS Extremities- no clubbing, cyanosis, or edema  ICD interrogation- personally reviewed in detail today,  See PACEART report ekg today reveal sinus 51 bpm, PR 298 msec, IVCD,  Qtc 447 msec  Assessment and Plan:  1. VT Improved  Continue amiodarone 200mg  daily No driving (pt aware)  bmet, mg today lfts/tfts on amiodarone today (labs from 12/17 reviewed) ICD is reaching ERI soon.  Given IVCD, I do not feel that he would benefit from upgrade to CRT presently. Risks, benefits, and alternatives to ICD pulse generator replacement were discussed in detail today.  The patient understands that risks include but are not limited to bleeding, infection, pneumothorax, perforation, tamponade, vascular damage, renal failure, MI, stroke, death, inappropriate shocks, damage to his existing leads, and lead dislodgement and wishes to proceed once he reaches ERI. Will be fine to schedule gen change once ERI without an additional office visit required.  2. CAD/ischemic CM s/p recent PCI for CTO of RCA Continue medical management  3. HTN Stable No change required today  4. Abnormal TFTs I have been very clear that he should follow-up with his PCP regarding this previously.  We will repeat labs today   carelink Return to see me in 3 months unless problems arise   Thompson Grayer MD, [facility] 08/14/2016 9:19 AM

## 2016-08-14 NOTE — Patient Instructions (Addendum)
Medication Instructions:  Your physician recommends that you continue on your current medications as directed. Please refer to the Current Medication list given to you today.   Labwork: Your physician recommends that you return for lab work today: MAG, CMET, TSH   Testing/Procedures: None ordered   Follow-Up: Your physician wants you to follow-up in: 3 months with Dr. Rayann Heman.  Remote monitoring is used to monitor your ICD from home. This monitoring reduces the number of office visits required to check your device to one time per year. It allows [place] to keep an eye on the functioning of your device to ensure it is working properly. You are scheduled for a device check from home on 11/13/16. You may send your transmission at any time that day. If you have a wireless device, the transmission will be sent automatically. After your physician reviews your transmission, you will receive a postcard with your next transmission date.     Any Other Special Instructions Will Be Listed Below (If Applicable).     If you need a refill on your cardiac medications before your next appointment, please call your pharmacy.

## 2016-08-15 LAB — COMPREHENSIVE METABOLIC PANEL
ALK PHOS: 101 IU/L (ref 39–117)
ALT: 15 IU/L (ref 0–44)
AST: 15 IU/L (ref 0–40)
Albumin/Globulin Ratio: 1.6 (ref 1.2–2.2)
Albumin: 3.6 g/dL (ref 3.5–4.8)
BUN/Creatinine Ratio: 17 (ref 10–24)
BUN: 31 mg/dL — ABNORMAL HIGH (ref 8–27)
Bilirubin Total: <0.2 mg/dL (ref 0.0–1.2)
CO2: 25 mmol/L (ref 18–29)
CREATININE: 1.78 mg/dL — AB (ref 0.76–1.27)
Calcium: 8.4 mg/dL — ABNORMAL LOW (ref 8.6–10.2)
Chloride: 100 mmol/L (ref 96–106)
GFR calc Af Amer: 43 mL/min/{1.73_m2} — ABNORMAL LOW (ref >59)
GFR calc non Af Amer: 38 mL/min/{1.73_m2} — ABNORMAL LOW (ref >59)
GLOBULIN, TOTAL: 2.3 g/dL (ref 1.5–4.5)
GLUCOSE: 93 mg/dL (ref 65–99)
POTASSIUM: 5 mmol/L (ref 3.5–5.2)
SODIUM: 138 mmol/L (ref 134–144)
Total Protein: 5.9 g/dL — ABNORMAL LOW (ref 6.0–8.5)

## 2016-08-15 LAB — MAGNESIUM: MAGNESIUM: 2.2 mg/dL (ref 1.6–2.3)

## 2016-08-15 LAB — TSH: TSH: 4.75 u[IU]/mL — AB (ref 0.450–4.500)

## 2016-08-15 LAB — T4, FREE: Free T4: 1.28 ng/dL (ref 0.82–1.77)

## 2016-08-21 ENCOUNTER — Ambulatory Visit (HOSPITAL_COMMUNITY)
Admission: RE | Admit: 2016-08-21 | Discharge: 2016-08-21 | Disposition: A | Discharge status: Home / Self Care | Payer: Medicare Other | Source: Ambulatory Visit | Attending: Cardiology | Admitting: Cardiology

## 2016-08-21 ENCOUNTER — Encounter (HOSPITAL_COMMUNITY): Payer: Self-pay

## 2016-08-21 VITALS — BP 108/56 | HR 52 | Wt 171.5 lb

## 2016-08-21 DIAGNOSIS — I739 Peripheral vascular disease, unspecified: Secondary | ICD-10-CM | POA: Diagnosis not present

## 2016-08-21 DIAGNOSIS — I472 Ventricular tachycardia, unspecified: Secondary | ICD-10-CM

## 2016-08-21 DIAGNOSIS — I130 Hypertensive heart and chronic kidney disease with heart failure and stage 1 through stage 4 chronic kidney disease, or unspecified chronic kidney disease: Secondary | ICD-10-CM | POA: Diagnosis not present

## 2016-08-21 DIAGNOSIS — Z79899 Other long term (current) drug therapy: Secondary | ICD-10-CM | POA: Insufficient documentation

## 2016-08-21 DIAGNOSIS — I255 Ischemic cardiomyopathy: Secondary | ICD-10-CM | POA: Insufficient documentation

## 2016-08-21 DIAGNOSIS — I5022 Chronic systolic (congestive) heart failure: Secondary | ICD-10-CM | POA: Insufficient documentation

## 2016-08-21 DIAGNOSIS — Z95810 Presence of automatic (implantable) cardiac defibrillator: Secondary | ICD-10-CM | POA: Insufficient documentation

## 2016-08-21 DIAGNOSIS — Z7902 Long term (current) use of antithrombotics/antiplatelets: Secondary | ICD-10-CM | POA: Diagnosis not present

## 2016-08-21 DIAGNOSIS — Z7982 Long term (current) use of aspirin: Secondary | ICD-10-CM | POA: Insufficient documentation

## 2016-08-21 DIAGNOSIS — I2510 Atherosclerotic heart disease of native coronary artery without angina pectoris: Secondary | ICD-10-CM | POA: Insufficient documentation

## 2016-08-21 NOTE — Patient Instructions (Signed)
Will refer you to [facility] in [place]. They will call you to schedule your initial appointment.  INCREASE Torsemide to 20 mg 91 tab) TWICE daily for THREE DAYS. Then reduce back to normal dose of 20 mg (1 tab) once daily.  No lab work today.  Follow up with Dr. Aundra Dubin in 2 months.  Do the following things EVERYDAY: 1) Weigh yourself in the morning before breakfast. Write it down and keep it in a log. 2) Take your medicines as prescribed 3) Eat low salt foods-Limit salt (sodium) to 2000 mg per day.  4) Stay as active as you can everyday 5) Limit all fluids for the day to less than 2 liters

## 2016-08-21 NOTE — Progress Notes (Signed)
Patient ID: Nathan Pruitt, male   DOB: [birth year], 71 y.o.   MRN: [id number]     Advanced Heart Failure Clinic Note   PCP: Dr. Maryella Shivers  71 yo with history of CAD, PAD, ischemic CMP, and VT s/p ICD placement.  Echo in 2/17 showed EF 40% and regional WMAs.  In 3/18, he developed episodes of VT as well as increased dyspnea.  LHC/RHC was done, showing new occlusion of a large ramus (CTO, probably a month or so old at time of cath) and old CTO RCA with collaterals.  He had DES x 3 to ramus.  Filling pressures were optimized on RHC.  Amiodarone was increased to 200 mg daily from 100 mg daily.  No VT since PCI.   He presents today for regular followup. Taking torsemide 20 mg daily.  He did not tolerate spironolactone and stopped it, does not remember what his symptoms were.  He is not very active but thinks that he feels better since 3/18 PCI.  He is able to walk up his driveway which is on a hill without dyspnea.  No dyspnea walking on flat ground.  No chest pain.  No orthopnea/PND.   Optivol: impedance trending down with fluid index near but not exceeding threshold. No atrial fibrillation or VT noted.   Labs (11/10): K 4.9, creatinine 1.4, LDL 70, HDL 31, LFTs normal Labs (3/11): K 4.4, creatinine 1.3, LDL 61, HDL 35 Labs (7/11): LDL 62, HDL 23 Labs (1/12): K 4.8, LFTs normal, creatinine 1.44, LDL 69, HDL 33 Labs (7/12): LFTs normal, LDL 81, HDL 36, HCT normal, K 4.3, creatinine 1.3, BNP 116, TSH normal Labs (1/13): LDL 39, HDl 31, LFTs normal, TSH  Labs (7/13): TSH normal, LFTs normal, LDL 69, HDL 40 Labs (8/13): K 4.5, creatinine 1.6 Labs (10/13): K 4.4, creatinine 1.4 Labs (3/14): LDL 108, HDL 30, LFTs normal Labs (9/14): K 4.2, creatinine 1.6, LFTs normal, LDL 114, TSH 10.6 (elevated), free T4/free T3 normal Labs (2/15): K 4.6, creatinine 1.5, LFTs normal, TSH 7.6 (elevated), free T4 and free T3 normal Labs (3/15): K 3.6, creatinine 1.5, LFTs normal, TSH normal Labs (9/15): K  4.3, creatinine 1.6, LFTs normal, TSH normal, LDL 115, HDL 24 Labs (10/16): K 4.5, creatinine 1.5, LDL 90, HDL 35, hgb 12.8 Labs (11/16): Low free T3, normal TSH and free T4 Labs (12/16): K 5, creatinine 1.8, LFTs normal, TSH 8.6 (mildly elevated) Labs (1/17): Free T4/TSH/free T3 normal, LFTs normal Labs (2/17): K 4.4, creatinine 2.4, LDL 131, HDL 34 Labs (4/17): K 4.2, creatinine 1.3, LDL 67, HDL 35 Labs (9/17): K 4.9, creatinine 2.2, LDL 54, HDL 35, free T3 and T4 normal, hgb 12 Labs (4/18): K 5, creatinine 1.78, LFTs normal, TSH elevated but free T3 and free T4 normal.    ECG (personally reviewed): NSR, IVCD (146 msec)  Allergies (verified):  No Known Drug Allergies  Past Medical History: 1. Coronary artery disease. The patient reports history of silent MI in 1993.  This was likely an inferior MI (see below).  - The patient presented to [facility] in [facility]10 with VT and mildly elevated cardiac enzymes. LHC (5/10):  Inferobasal dyskinesis with EF 35-40%.  There was chronic total occlusion of the mid RCA with good collaterals.  Luminals LCA.  This did not appear to be an acute cause of the 5/10 event.  - LHC (3/18): Known occlusion of RCA with collaterals, new occlusion of ramus.  DES x 3 to ramus.  2. Hypertension.  3. Hyperlipidemia: Intolerant of simvastatin, Crestor, Lipitor, pravastatin. .  4. Remote tobacco abuse with 47-pack-year history, quitting in August 2009.  5. Peripheral arterial disease.  - Status post left-to-right fem-fem bypass performed in [place] in March 2009.  - Status post redo left-to-right fem-fem bypass performed by Dr. Scot Dock at [facility] in 2009.  - ABIs normal 3/15, ABIs normal 3/16, ABIs normal 3/17, ABIs normal 4/18.  6. Rheumatoid arthritis, on leflunomide.  7.  Ischemic cardiomyopathy:  EF 35-40% by LV-gram 5/10 with inferobasal dyskinesis.  Echo 5/10 showed EF 40% with mild LVH, no significant MR, inferobasal and posterobasal akinesis.  Echo (7/11): EF  50%, mild LVH, basal-mid inferoposterior akinesis.  Echo (7/12): EF 45-50% with basal anterolateral, basal posterior, and basal to mid inferior akinesis.  Echo (4/16): EF 40-45%, basal to mid inferolateral AK, basal inferior AK, basal to mid anterolateral HK.  Echo (2/17): EF 40% with basal to mid inferior akinesis, basal inferolateral aneurysm, mid inferolateral akinesis, basal anterolateral hypokinesis, normal RV size and systolic function, PASP 25 mmHg.  - Echo (3/18) with EF 30-35%, moderate LV dilation, moderate MR, mildly decreased RV systolic function, PASP 51 mmmHg. - RHC (3/18): mean RA 4, PA 38/12, mean PCWP 17, CI 2.2.  8.  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  He has a dual chamber Medtronic ICD.  9.  Atrial flutter:  Status post isthmus ablation 5/10.   10.  PFTs (7/11): FVC 74%, FEV1 80%, ratio 75%, TLC 78%, DLCO 68%.  This suggests a mild restrictive defect and a mild obstructive defect.  He did have response to bronchodilator. These PFTs were significantly better than the report from Dr. Alcide Clever in [place] done prior.  He had last PFTs in 2/12 with no significant change.  11.  Anxiety 12.  Chronic cough: No relief with change from ACEI to ARB or with trial of PPI.  13.  CKD: Stage 3.   Family History: Mother died in her late 29s with gastric cancer.  Father died in his late 27s with throat cancer and PVD.   He had a brother who died at 52 of an MI.   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 -47ppy hx, quit 2009.  Alcohol Use - yes-minimal Regular Exercise - yes Drug Use - no (prior)  Review of Systems        All systems reviewed and negative except as per HPI.   Current Outpatient Prescriptions  Medication Sig Dispense Refill  . amiodarone (PACERONE) 200 MG tablet Take 1 tablet (200 mg total) by mouth daily. 90 tablet 3  . aspirin EC 81 MG tablet  Take 1 tablet (81 mg total) by mouth daily.    . carvedilol (COREG) 25 MG tablet Take 1 tablet (25 mg total) by mouth 2 (two) times daily with a meal. 180 tablet 3  . chlorhexidine (PERIDEX) 0.12 % solution Swish and rinse as directed  3  . clopidogrel (PLAVIX) 75 MG tablet Take 1 tablet (75 mg total) by mouth daily with breakfast. Please prescribe generic 90 tablet 3  . ezetimibe (ZETIA) 10 MG tablet Take 1 tablet (10 mg total) by mouth daily. 90 tablet 3  . fluticasone (FLONASE) 50 MCG/ACT nasal spray Place 1-2 sprays into both nostrils daily as needed for allergies or rhinitis.    [name] leflunomide (ARAVA) 20 MG tablet Take 20 mg by mouth daily.      [name]  Melatonin 10 MG TABS Take 10 mg by mouth daily as needed (sleep).    Vladimir Faster Glycol-Propyl Glycol (SYSTANE OP) Apply 1 drop to eye daily as needed (dry eyes).    . simvastatin (ZOCOR) 20 MG tablet Take 1 tablet (20 mg total) by mouth at bedtime. 90 tablet 3  . torsemide (DEMADEX) 20 MG tablet Take 1 tablet (20 mg total) by mouth daily. 90 tablet 3  . valsartan (DIOVAN) 160 MG tablet Take 1 tablet (160 mg total) by mouth 2 (two) times daily. 180 tablet 3   No current facility-administered medications for this encounter.     Vitals:   08/21/16 0843  BP: (!) 108/56  Pulse: (!) 52  SpO2: 98%  Weight: 171 lb 8 oz (77.8 kg)   Wt Readings from Last 3 Encounters:  08/21/16 171 lb 8 oz (77.8 kg)  08/14/16 172 lb (78 kg)  08/07/16 163 lb (73.9 kg)     General:  Well developed, well nourished, in no acute distress. Neck:  Neck supple, JVP not elevated. No masses, thyromegaly or abnormal cervical nodes. Lungs:   Clear to auscultation bilaterally, normal effort. Heart:  Non-displaced PMI, chest non-tender; RRR, S1, S2 without murmurs, rubs or gallops. Carotid upstroke normal, no bruit. Normal abdominal aortic size, no bruits. Femorals normal pulses, no bruits. Pedals normal pulses.  Abdomen: Soft, NT, ND, no HSM. No bruits or masses. +BS    Extremities:  No clubbing or cyanosis. No peripheral edema or varicosities noted.  Neurologic:  Alert and oriented x 3. Psych:  Normal affect.  Assessment/Plan:  1. Chronic systolic CHF Ischemic cardiomyopathy.  Medtronic ICD.  Echo (3/18) with EF 30-35%. He has NYHA Class II dyspnea, some improvement after 3/18 PCI.  Volume status stable by exam though Optivol suggests some accumulation. .   - Continue Coreg 25 mg bid and valsartan 160 mg bid. - Increase torsemide to 20 mg bid x 3 days then back to 20 mg daily.  - We discussed transitioning to [facility], he does not want to at this point.    - He is off spironolactone (had side effects, does not remember what).  2. CAD Denies chest pain.  Had DES x 3 to ramus in 3/18.   - Continue ASA 81 mg daily, Zocor 20 mg daily, Zetia 10 mg daily, and Coreg/ARB as above.   3. HLD  Stable on Zetia and simvastatin as above without myalgias. Good lipids in 9/17.  4. Hx of VT.   Continue amiodarone 200 mg daily (increased from 100 mg daily after recent VT). Has ICD.  - Recent LFTs normal.  TSH mildly elevated but free T3 and T4 normal.  - Knows to get a yearly eye exam 5. PAD He denies claudication.  Normal ABIs in 4/18, followed by [facility].   Followup in 2 months.   Loralie Champagne 08/21/2016

## 2016-08-26 DIAGNOSIS — R972 Elevated prostate specific antigen [PSA]: Secondary | ICD-10-CM | POA: Diagnosis not present

## 2016-08-26 DIAGNOSIS — N3020 Other chronic cystitis without hematuria: Secondary | ICD-10-CM | POA: Diagnosis not present

## 2016-08-26 DIAGNOSIS — R351 Nocturia: Secondary | ICD-10-CM | POA: Diagnosis not present

## 2016-08-26 DIAGNOSIS — N401 Enlarged prostate with lower urinary tract symptoms: Secondary | ICD-10-CM | POA: Diagnosis not present

## 2016-08-28 ENCOUNTER — Other Ambulatory Visit: Payer: Self-pay | Admitting: Internal Medicine

## 2016-08-29 ENCOUNTER — Other Ambulatory Visit: Payer: Self-pay | Admitting: Internal Medicine

## 2016-08-29 ENCOUNTER — Telehealth (HOSPITAL_COMMUNITY): Payer: Self-pay

## 2016-08-29 NOTE — Telephone Encounter (Signed)
Received call from dental office stating patient there for routine cleaning and crown, asking for clearance per Dr. Aundra Dubin. Advised per Dr. Aundra Dubin, no antibiotic treatment required, does not need to hold plavix, and is able to tolerate local anesthetic. Also had Dr. Aundra Dubin sign and fax back clearance letter from their office.  Renee Pain, RN

## 2016-08-31 ENCOUNTER — Other Ambulatory Visit (HOSPITAL_COMMUNITY): Payer: Self-pay | Admitting: Cardiology

## 2016-08-31 DIAGNOSIS — I472 Ventricular tachycardia, unspecified: Secondary | ICD-10-CM

## 2016-10-09 ENCOUNTER — Other Ambulatory Visit: Payer: Self-pay | Admitting: Cardiology

## 2016-10-13 ENCOUNTER — Encounter: Payer: Self-pay | Admitting: Internal Medicine

## 2016-10-14 ENCOUNTER — Telehealth: Payer: Self-pay | Admitting: Cardiology

## 2016-10-14 NOTE — Telephone Encounter (Signed)
Patient called and stated that he heard the alert tone go off on his device over the weekend. Informed pt that we did receive a remote transmission and it did indicate that his device reached RRT on 10-13-2016. Pt verbalized understanding. Informed him that MD nurse would call to schedule procedure. Pt wanted to know if he could continue his normal day activities as usual. I informed pt that he could and that by doing so would not deplete his battery any faster, and that once his device reached RRT it indicates that there is about 3 months left on the battery. He can opt to make an appt to have alarm turned off or keep it on it will not make a difference in the battery either way. Pt verbalized understanding.

## 2016-10-16 ENCOUNTER — Encounter (HOSPITAL_COMMUNITY): Payer: Self-pay

## 2016-10-16 ENCOUNTER — Telehealth: Payer: Self-pay | Admitting: Internal Medicine

## 2016-10-16 ENCOUNTER — Encounter: Payer: Self-pay | Admitting: Internal Medicine

## 2016-10-16 NOTE — Telephone Encounter (Signed)
See the my-chart message sent.  Answered all his questions

## 2016-10-16 NOTE — Telephone Encounter (Signed)
New message    Pt is calling asking for a call back from [place]. He said he spoke with her today and has some more questions.

## 2016-10-23 ENCOUNTER — Ambulatory Visit (HOSPITAL_COMMUNITY)
Admission: RE | Admit: 2016-10-23 | Discharge: 2016-10-23 | Disposition: A | Discharge status: Home / Self Care | Payer: Medicare Other | Source: Ambulatory Visit | Attending: Cardiology | Admitting: Cardiology

## 2016-10-23 ENCOUNTER — Encounter (HOSPITAL_COMMUNITY): Payer: Self-pay | Admitting: *Deleted

## 2016-10-23 ENCOUNTER — Encounter: Payer: Self-pay | Admitting: Internal Medicine

## 2016-10-23 VITALS — BP 110/76 | HR 61 | Wt 178.2 lb

## 2016-10-23 DIAGNOSIS — I4892 Unspecified atrial flutter: Secondary | ICD-10-CM | POA: Insufficient documentation

## 2016-10-23 DIAGNOSIS — Z7902 Long term (current) use of antithrombotics/antiplatelets: Secondary | ICD-10-CM | POA: Insufficient documentation

## 2016-10-23 DIAGNOSIS — I5022 Chronic systolic (congestive) heart failure: Secondary | ICD-10-CM | POA: Diagnosis not present

## 2016-10-23 DIAGNOSIS — I472 Ventricular tachycardia, unspecified: Secondary | ICD-10-CM

## 2016-10-23 DIAGNOSIS — Z808 Family history of malignant neoplasm of other organs or systems: Secondary | ICD-10-CM | POA: Insufficient documentation

## 2016-10-23 DIAGNOSIS — Z8249 Family history of ischemic heart disease and other diseases of the circulatory system: Secondary | ICD-10-CM | POA: Insufficient documentation

## 2016-10-23 DIAGNOSIS — Z79899 Other long term (current) drug therapy: Secondary | ICD-10-CM | POA: Diagnosis not present

## 2016-10-23 DIAGNOSIS — Z800 Family history of malignant neoplasm of digestive organs: Secondary | ICD-10-CM | POA: Diagnosis not present

## 2016-10-23 DIAGNOSIS — Z95810 Presence of automatic (implantable) cardiac defibrillator: Secondary | ICD-10-CM | POA: Diagnosis not present

## 2016-10-23 DIAGNOSIS — I2582 Chronic total occlusion of coronary artery: Secondary | ICD-10-CM | POA: Diagnosis not present

## 2016-10-23 DIAGNOSIS — E785 Hyperlipidemia, unspecified: Secondary | ICD-10-CM | POA: Insufficient documentation

## 2016-10-23 DIAGNOSIS — I252 Old myocardial infarction: Secondary | ICD-10-CM | POA: Insufficient documentation

## 2016-10-23 DIAGNOSIS — I130 Hypertensive heart and chronic kidney disease with heart failure and stage 1 through stage 4 chronic kidney disease, or unspecified chronic kidney disease: Secondary | ICD-10-CM | POA: Diagnosis not present

## 2016-10-23 DIAGNOSIS — R946 Abnormal results of thyroid function studies: Secondary | ICD-10-CM | POA: Insufficient documentation

## 2016-10-23 DIAGNOSIS — I2510 Atherosclerotic heart disease of native coronary artery without angina pectoris: Secondary | ICD-10-CM | POA: Insufficient documentation

## 2016-10-23 DIAGNOSIS — M069 Rheumatoid arthritis, unspecified: Secondary | ICD-10-CM | POA: Diagnosis not present

## 2016-10-23 DIAGNOSIS — F419 Anxiety disorder, unspecified: Secondary | ICD-10-CM | POA: Diagnosis not present

## 2016-10-23 DIAGNOSIS — I255 Ischemic cardiomyopathy: Secondary | ICD-10-CM | POA: Insufficient documentation

## 2016-10-23 DIAGNOSIS — Z7982 Long term (current) use of aspirin: Secondary | ICD-10-CM | POA: Diagnosis not present

## 2016-10-23 DIAGNOSIS — I739 Peripheral vascular disease, unspecified: Secondary | ICD-10-CM | POA: Insufficient documentation

## 2016-10-23 LAB — CBC
HEMATOCRIT: 37.3 % — AB (ref 39.0–52.0)
Hemoglobin: 12.3 g/dL — ABNORMAL LOW (ref 13.0–17.0)
MCH: 30.4 pg (ref 26.0–34.0)
MCHC: 33 g/dL (ref 30.0–36.0)
MCV: 92.1 fL (ref 78.0–100.0)
PLATELETS: 193 10*3/uL (ref 150–400)
RBC: 4.05 MIL/uL — ABNORMAL LOW (ref 4.22–5.81)
RDW: 13.6 % (ref 11.5–15.5)
WBC: 8.6 10*3/uL (ref 4.0–10.5)

## 2016-10-23 LAB — COMPREHENSIVE METABOLIC PANEL
ALK PHOS: 93 U/L (ref 38–126)
ALT: 19 U/L (ref 17–63)
AST: 19 U/L (ref 15–41)
Albumin: 3.6 g/dL (ref 3.5–5.0)
Anion gap: 7 (ref 5–15)
BUN: 16 mg/dL (ref 6–20)
CALCIUM: 8.6 mg/dL — AB (ref 8.9–10.3)
CHLORIDE: 104 mmol/L (ref 101–111)
CO2: 26 mmol/L (ref 22–32)
CREATININE: 1.5 mg/dL — AB (ref 0.61–1.24)
GFR, EST AFRICAN AMERICAN: 52 mL/min — AB (ref >60)
GFR, EST NON AFRICAN AMERICAN: 45 mL/min — AB (ref >60)
Glucose, Bld: 102 mg/dL — ABNORMAL HIGH (ref 65–99)
Potassium: 4.6 mmol/L (ref 3.5–5.1)
Sodium: 137 mmol/L (ref 135–145)
Total Bilirubin: 0.4 mg/dL (ref 0.3–1.2)
Total Protein: 6.7 g/dL (ref 6.5–8.1)

## 2016-10-23 LAB — LIPID PANEL
Cholesterol: 107 mg/dL (ref 0–200)
HDL: 35 mg/dL — AB (ref >40)
LDL CALC: 52 mg/dL (ref 0–99)
Total CHOL/HDL Ratio: 3.1 RATIO
Triglycerides: 102 mg/dL (ref <150)
VLDL: 20 mg/dL (ref 0–40)

## 2016-10-23 LAB — TSH: TSH: 6.812 u[IU]/mL — AB (ref 0.350–4.500)

## 2016-10-23 NOTE — Patient Instructions (Signed)
Labs today  We will contact you in 4 months to schedule your next appointment.  

## 2016-10-23 NOTE — Progress Notes (Signed)
Patient ID: Nathan Pruitt, male   DOB: Apr 06, 1946, 71 y.o.   MRN: [id number]     Advanced Heart Failure Clinic Note   PCP: Dr. Maryella Shivers  71 yo with history of CAD, PAD, ischemic CMP, and VT s/p ICD placement.  Echo in 2/17 showed EF 40% and regional WMAs.  In 3/18, he developed episodes of VT as well as increased dyspnea.  LHC/RHC was done, showing new occlusion of a large ramus (CTO, probably a month or so old at time of cath) and old CTO RCA with collaterals.  He had DES x 3 to ramus.  Filling pressures were optimized on RHC.  Amiodarone was increased to 200 mg daily from 100 mg daily.  No VT since PCI.   He presents today for regular followup. Taking torsemide 20 mg daily.  He did not tolerate spironolactone and stopped it, does not remember what his symptoms were.  He is not very active but thinks that he feels better since 3/18 PCI.  No dyspnea walking on flat ground.  Able to walk up the hill on his driveway.  No chest pain.  No orthopnea/PND. Weight is up but has been eating more and exercising less.   Optivol: fluid index < threshold, stable impedance. No atrial fibrillation or VT noted.   Labs (11/10): K 4.9, creatinine 1.4, LDL 70, HDL 31, LFTs normal Labs (3/11): K 4.4, creatinine 1.3, LDL 61, HDL 35 Labs (7/11): LDL 62, HDL 23 Labs (1/12): K 4.8, LFTs normal, creatinine 1.44, LDL 69, HDL 33 Labs (7/12): LFTs normal, LDL 81, HDL 36, HCT normal, K 4.3, creatinine 1.3, BNP 116, TSH normal Labs (1/13): LDL 39, HDl 31, LFTs normal, TSH  Labs (7/13): TSH normal, LFTs normal, LDL 69, HDL 40 Labs (8/13): K 4.5, creatinine 1.6 Labs (10/13): K 4.4, creatinine 1.4 Labs (3/14): LDL 108, HDL 30, LFTs normal Labs (9/14): K 4.2, creatinine 1.6, LFTs normal, LDL 114, TSH 10.6 (elevated), free T4/free T3 normal Labs (2/15): K 4.6, creatinine 1.5, LFTs normal, TSH 7.6 (elevated), free T4 and free T3 normal Labs (3/15): K 3.6, creatinine 1.5, LFTs normal, TSH normal Labs (9/15): K  4.3, creatinine 1.6, LFTs normal, TSH normal, LDL 115, HDL 24 Labs (10/16): K 4.5, creatinine 1.5, LDL 90, HDL 35, hgb 12.8 Labs (11/16): Low free T3, normal TSH and free T4 Labs (12/16): K 5, creatinine 1.8, LFTs normal, TSH 8.6 (mildly elevated) Labs (1/17): Free T4/TSH/free T3 normal, LFTs normal Labs (2/17): K 4.4, creatinine 2.4, LDL 131, HDL 34 Labs (4/17): K 4.2, creatinine 1.3, LDL 67, HDL 35 Labs (9/17): K 4.9, creatinine 2.2, LDL 54, HDL 35, free T3 and T4 normal, hgb 12 Labs (4/18): K 5, creatinine 1.78, LFTs normal, TSH elevated but free T3 and free T4 normal.   Allergies (verified):  No Known Drug Allergies  Past Medical History: 1. Coronary artery disease. The patient reports history of silent MI in 1993.  This was likely an inferior MI (see below).  - The patient presented to [facility] in 5/10 with VT and mildly elevated cardiac enzymes. LHC (5/10):  Inferobasal dyskinesis with EF 35-40%.  There was chronic total occlusion of the mid RCA with good collaterals.  Luminals LCA.  This did not appear to be an acute cause of the 5/10 event.  - LHC (3/18): Known occlusion of RCA with collaterals, new occlusion of ramus.  DES x 3 to ramus.  2. Hypertension.  3. Hyperlipidemia: Intolerant of simvastatin, Crestor, Lipitor, pravastatin. [name]  4. Remote tobacco abuse with 47-pack-year history, quitting in August 2009.  5. Peripheral arterial disease.  - Status post left-to-right fem-fem bypass performed in [place] in March 2009.  - Status post redo left-to-right fem-fem bypass performed by Dr. Scot Dock at [facility] in 2009.  - ABIs normal 3/15, ABIs normal 3/16, ABIs normal 3/17, ABIs normal 4/18.  6. Rheumatoid arthritis, on leflunomide.  7.  Ischemic cardiomyopathy:  EF 35-40% by LV-gram 5/10 with inferobasal dyskinesis.  Echo 5/10 showed EF 40% with mild LVH, no significant MR, inferobasal and posterobasal akinesis.  Echo (7/11): EF 50%, mild LVH, basal-mid inferoposterior akinesis.   Echo (7/12): EF 45-50% with basal anterolateral, basal posterior, and basal to mid inferior akinesis.  Echo (4/16): EF 40-45%, basal to mid inferolateral AK, basal inferior AK, basal to mid anterolateral HK.  Echo (2/17): EF 40% with basal to mid inferior akinesis, basal inferolateral aneurysm, mid inferolateral akinesis, basal anterolateral hypokinesis, normal RV size and systolic function, PASP 25 mmHg.  - Echo (3/18) with EF 30-35%, moderate LV dilation, moderate MR, mildly decreased RV systolic function, PASP 51 mmmHg. - RHC (3/18): mean RA 4, PA 38/12, mean PCWP 17, CI 2.2.  8.  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  He has a dual chamber Medtronic ICD.  9.  Atrial flutter:  Status post isthmus ablation 5/10.   10.  PFTs (7/11): FVC 74%, FEV1 80%, ratio 75%, TLC 78%, DLCO 68%.  This suggests a mild restrictive defect and a mild obstructive defect.  He did have response to bronchodilator. These PFTs were significantly better than the report from Dr. Alcide Clever in [place] done prior.  He had last PFTs in 2/12 with no significant change.  11.  Anxiety 12.  Chronic cough: No relief with change from ACEI to ARB or with trial of PPI.  13.  CKD: Stage 3.   Family History: Mother died in her late 51s with gastric cancer.  Father died in his late 38s with throat cancer and PVD.   He had a brother who died at 81 of an MI.   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 -47ppy hx, quit 2009.  Alcohol Use - yes-minimal Regular Exercise - yes Drug Use - no (prior)  Review of Systems        All systems reviewed and negative except as per HPI.   Current Outpatient Prescriptions  Medication Sig Dispense Refill  . amiodarone (PACERONE) 200 MG tablet Take 1 tablet (200 mg total) by mouth daily. 90 tablet 3  . amiodarone (PACERONE) 200 MG tablet Take 1 tablet (200 mg total) by mouth  daily. 90 tablet 3  . aspirin EC 81 MG tablet Take 1 tablet (81 mg total) by mouth daily.    . carvedilol (COREG) 25 MG tablet Take 1 tablet (25 mg total) by mouth 2 (two) times daily with a meal. 180 tablet 3  . chlorhexidine (PERIDEX) 0.12 % solution Swish and rinse as directed  3  . clopidogrel (PLAVIX) 75 MG tablet Take 1 tablet (75 mg total) by mouth daily with breakfast. Please prescribe generic 90 tablet 3  . ezetimibe (ZETIA) 10 MG tablet Take 1 tablet (10 mg total) by mouth daily. 90 tablet 3  . fluticasone (FLONASE) 50 MCG/ACT nasal spray Place 1-2 sprays into both nostrils daily as needed for allergies or rhinitis.    [name] leflunomide (ARAVA) 20 MG tablet Take  20 mg by mouth daily.      . Melatonin 10 MG TABS Take 10 mg by mouth daily as needed (sleep).    Vladimir Faster Glycol-Propyl Glycol (SYSTANE OP) Apply 1 drop to eye daily as needed (dry eyes).    . simvastatin (ZOCOR) 20 MG tablet Take 1 tablet (20 mg total) by mouth at bedtime. 90 tablet 3  . torsemide (DEMADEX) 20 MG tablet Take 1 tablet (20 mg total) by mouth daily. 90 tablet 3  . valsartan (DIOVAN) 160 MG tablet Take 1 tablet (160 mg total) by mouth 2 (two) times daily. 180 tablet 3   No current facility-administered medications for this encounter.     Vitals:   10/23/16 0835  BP: 110/76  Pulse: 61  SpO2: 100%  Weight: 178 lb 4 oz (80.9 kg)   Wt Readings from Last 3 Encounters:  10/23/16 178 lb 4 oz (80.9 kg)  08/21/16 171 lb 8 oz (77.8 kg)  08/14/16 172 lb (78 kg)     General:  Well developed, well nourished, in no acute distress. Neck:  Neck supple, JVP 7 cm. No masses, thyromegaly or abnormal cervical nodes. Lungs:   Clear bilaterally.  Heart:  Non-displaced PMI, chest non-tender; RRR, S1, S2 without murmurs, rubs or gallops. Carotid upstroke normal, no bruit. Normal abdominal aortic size, no bruits. Femorals normal pulses, no bruits. Pedals normal pulses.  Abdomen: Soft, NT, ND, no HSM. No bruits or masses. +BS    Extremities:  No clubbing or cyanosis. No peripheral edema or varicosities noted.  Neurologic:  Alert and oriented x 3. Psych:  Normal affect.  Assessment/Plan:  1. Chronic systolic CHF Ischemic cardiomyopathy.  Medtronic ICD.  Echo (3/18) with EF 30-35%. He has NYHA Class II dyspnea, some improvement after 3/18 PCI.  Volume status stable by exam and by personal interrogation of device for Optivol.   - Continue Coreg 25 mg bid and valsartan 160 mg bid. - Continue torsemide 20 mg daily.  - We have discussed transitioning to [facility], he wants to stay on valsartan.     - He is off spironolactone (had side effects, does not remember what).  K was 5 when last checked.  Will check BMET today, if K is not elevated would consider trial of eplerenone at next appointment.  2. CAD Denies chest pain.  Had DES x 3 to ramus in 3/18.   - Continue ASA 81 mg daily, clopidogrel, Zocor 20 mg daily, Zetia 10 mg daily, and Coreg/ARB as above.   3. HLD  Stable on Zetia and simvastatin as above without myalgias. Repeat lipids today.   4. Hx of VT.   Continue amiodarone 200 mg daily (increased from 100 mg daily after last VT event). Has ICD.  - TSH mildly elevated but free T3 and T4 normal in past. Check TSH, free T3/T4, and LFTs today with amiodarone use.  - Knows to get a yearly eye exam 5. PAD He denies claudication.  Normal ABIs in 4/18, followed by [facility].   Followup in 4 months.   Loralie Champagne 10/23/2016

## 2016-10-25 ENCOUNTER — Telehealth (HOSPITAL_COMMUNITY): Payer: Self-pay | Admitting: *Deleted

## 2016-10-25 NOTE — Telephone Encounter (Signed)
TSH  Order: [id number]  Status:  Final result Visible to patient:  Yes (MyChart) Dx:  Chronic systolic CHF (congestive hear...  Notes recorded by Kennieth Rad, RN on 10/25/2016 at 1:57 PM EDT Called and spoke with patient about labs. He is aware of results and said he is seeing a new PCP next week and wants to discuss his TSH further. No further questions at this time. ------  Notes recorded by Larey Dresser, MD on 10/23/2016 at 4:55 PM EDT Good lipids, overall labs stable.

## 2016-10-29 ENCOUNTER — Telehealth: Payer: Self-pay | Admitting: *Deleted

## 2016-10-29 DIAGNOSIS — Z79899 Other long term (current) drug therapy: Secondary | ICD-10-CM | POA: Diagnosis not present

## 2016-10-29 DIAGNOSIS — Z6825 Body mass index (BMI) 25.0-25.9, adult: Secondary | ICD-10-CM | POA: Diagnosis not present

## 2016-10-29 DIAGNOSIS — E039 Hypothyroidism, unspecified: Secondary | ICD-10-CM | POA: Diagnosis not present

## 2016-10-29 DIAGNOSIS — R001 Bradycardia, unspecified: Secondary | ICD-10-CM | POA: Diagnosis not present

## 2016-10-29 NOTE — Telephone Encounter (Signed)
Remote transmission reviewed. Presenting rhythm: AsVs, ApVs w/PVCs. 148.7PVCs/hr since 10/23/16. No atrial or ventricular arrhythmias recorded. Stable device function. ERI since 10/13/16 noted.   Informed patient that his PCP got the low HR measurement d/t his frequent PVCs. I explained to patient what PVCs are and that the PVCs are not a result of the device reaching ERI. Patient verbalized understanding and states that he hasn't noticed any sx's. He said that he has been a bit anxious lately d/t his upcoming procedure, but that's it. Patient asked if he should move his procedure up d/t the recent findings. I assured patient that it would be fine to leave the procedure for the 10th. I told him that I would send the information to Dr.Allred and contact him if anything further was recommended. Patient verbalized understanding.

## 2016-10-29 NOTE — Telephone Encounter (Signed)
[facility] called in stating that patient's HR was obtained radially and it stayed between 40-44bpm throughout the visit. RN states that patient is asymptomatic. She wanted to know if patient could follow up on 7/10 as scheduled (gen change) or if he would need to be seen sooner.  Discussed situation with Dr.Camnitz (DOD), recommendation was for patient to send manual transmission once he returns home.   Informed RN of recommendation. Will call patient once transmission is received.

## 2016-11-11 ENCOUNTER — Other Ambulatory Visit: Payer: Self-pay | Admitting: Internal Medicine

## 2016-11-11 DIAGNOSIS — L570 Actinic keratosis: Secondary | ICD-10-CM | POA: Diagnosis not present

## 2016-11-11 DIAGNOSIS — D2239 Melanocytic nevi of other parts of face: Secondary | ICD-10-CM | POA: Diagnosis not present

## 2016-11-11 DIAGNOSIS — C44622 Squamous cell carcinoma of skin of right upper limb, including shoulder: Secondary | ICD-10-CM | POA: Diagnosis not present

## 2016-11-11 DIAGNOSIS — D225 Melanocytic nevi of trunk: Secondary | ICD-10-CM | POA: Diagnosis not present

## 2016-11-12 ENCOUNTER — Ambulatory Visit (HOSPITAL_COMMUNITY)
Admission: RE | Admit: 2016-11-12 | Discharge: 2016-11-12 | Disposition: A | Discharge status: Home / Self Care | Payer: Medicare Other | Source: Ambulatory Visit | Attending: Internal Medicine | Admitting: Internal Medicine

## 2016-11-12 ENCOUNTER — Encounter (HOSPITAL_COMMUNITY)
Admission: RE | Disposition: A | Discharge status: Home / Self Care | Payer: Self-pay | Source: Ambulatory Visit | Attending: Internal Medicine

## 2016-11-12 DIAGNOSIS — Z4502 Encounter for adjustment and management of automatic implantable cardiac defibrillator: Secondary | ICD-10-CM | POA: Diagnosis not present

## 2016-11-12 DIAGNOSIS — Z87891 Personal history of nicotine dependence: Secondary | ICD-10-CM | POA: Diagnosis not present

## 2016-11-12 DIAGNOSIS — I509 Heart failure, unspecified: Secondary | ICD-10-CM | POA: Diagnosis not present

## 2016-11-12 DIAGNOSIS — F419 Anxiety disorder, unspecified: Secondary | ICD-10-CM | POA: Insufficient documentation

## 2016-11-12 DIAGNOSIS — Z7982 Long term (current) use of aspirin: Secondary | ICD-10-CM | POA: Diagnosis not present

## 2016-11-12 DIAGNOSIS — Z7902 Long term (current) use of antithrombotics/antiplatelets: Secondary | ICD-10-CM | POA: Diagnosis not present

## 2016-11-12 DIAGNOSIS — Z79899 Other long term (current) drug therapy: Secondary | ICD-10-CM | POA: Insufficient documentation

## 2016-11-12 DIAGNOSIS — I255 Ischemic cardiomyopathy: Secondary | ICD-10-CM | POA: Diagnosis not present

## 2016-11-12 DIAGNOSIS — Z7952 Long term (current) use of systemic steroids: Secondary | ICD-10-CM | POA: Diagnosis not present

## 2016-11-12 DIAGNOSIS — I472 Ventricular tachycardia: Secondary | ICD-10-CM | POA: Insufficient documentation

## 2016-11-12 DIAGNOSIS — Z95810 Presence of automatic (implantable) cardiac defibrillator: Secondary | ICD-10-CM | POA: Diagnosis not present

## 2016-11-12 DIAGNOSIS — I110 Hypertensive heart disease with heart failure: Secondary | ICD-10-CM | POA: Diagnosis not present

## 2016-11-12 DIAGNOSIS — G8929 Other chronic pain: Secondary | ICD-10-CM | POA: Insufficient documentation

## 2016-11-12 DIAGNOSIS — I252 Old myocardial infarction: Secondary | ICD-10-CM | POA: Insufficient documentation

## 2016-11-12 DIAGNOSIS — R001 Bradycardia, unspecified: Secondary | ICD-10-CM | POA: Insufficient documentation

## 2016-11-12 DIAGNOSIS — E785 Hyperlipidemia, unspecified: Secondary | ICD-10-CM | POA: Insufficient documentation

## 2016-11-12 DIAGNOSIS — I2510 Atherosclerotic heart disease of native coronary artery without angina pectoris: Secondary | ICD-10-CM | POA: Insufficient documentation

## 2016-11-12 DIAGNOSIS — M069 Rheumatoid arthritis, unspecified: Secondary | ICD-10-CM | POA: Insufficient documentation

## 2016-11-12 HISTORY — PX: ICD GENERATOR CHANGEOUT: EP1231

## 2016-11-12 LAB — BASIC METABOLIC PANEL
Anion gap: 7 (ref 5–15)
BUN: 23 mg/dL — AB (ref 6–20)
CALCIUM: 8.5 mg/dL — AB (ref 8.9–10.3)
CHLORIDE: 106 mmol/L (ref 101–111)
CO2: 24 mmol/L (ref 22–32)
CREATININE: 1.68 mg/dL — AB (ref 0.61–1.24)
GFR, EST AFRICAN AMERICAN: 46 mL/min — AB (ref >60)
GFR, EST NON AFRICAN AMERICAN: 39 mL/min — AB (ref >60)
Glucose, Bld: 99 mg/dL (ref 65–99)
Potassium: 4.3 mmol/L (ref 3.5–5.1)
SODIUM: 137 mmol/L (ref 135–145)

## 2016-11-12 LAB — SURGICAL PCR SCREEN
MRSA, PCR: NEGATIVE
STAPHYLOCOCCUS AUREUS: NEGATIVE

## 2016-11-12 LAB — CBC
HCT: 34.5 % — ABNORMAL LOW (ref 39.0–52.0)
Hemoglobin: 11.3 g/dL — ABNORMAL LOW (ref 13.0–17.0)
MCH: 30 pg (ref 26.0–34.0)
MCHC: 32.8 g/dL (ref 30.0–36.0)
MCV: 91.5 fL (ref 78.0–100.0)
PLATELETS: 177 10*3/uL (ref 150–400)
RBC: 3.77 MIL/uL — ABNORMAL LOW (ref 4.22–5.81)
RDW: 13.4 % (ref 11.5–15.5)
WBC: 7.6 10*3/uL (ref 4.0–10.5)

## 2016-11-12 SURGERY — ICD GENERATOR CHANGEOUT

## 2016-11-12 MED ORDER — CEFAZOLIN SODIUM-DEXTROSE 2-4 GM/100ML-% IV SOLN
INTRAVENOUS | Status: AC
  Filled 2016-11-12: qty 100

## 2016-11-12 MED ORDER — LIDOCAINE HCL (PF) 1 % IJ SOLN
INTRAMUSCULAR | Status: AC
  Filled 2016-11-12: qty 60

## 2016-11-12 MED ORDER — LIDOCAINE HCL (PF) 1 % IJ SOLN
INTRAMUSCULAR | Status: DC | PRN
  Administered 2016-11-12: 45 mL

## 2016-11-12 MED ORDER — CHLORHEXIDINE GLUCONATE 4 % EX LIQD: 60.0000 mL | Freq: Once | CUTANEOUS | Status: DC

## 2016-11-12 MED ORDER — SODIUM CHLORIDE 0.9% FLUSH: 3.0000 mL | INTRAVENOUS | Status: DC | PRN

## 2016-11-12 MED ORDER — MIDAZOLAM HCL 5 MG/5ML IJ SOLN
INTRAMUSCULAR | Status: AC
  Filled 2016-11-12: qty 5

## 2016-11-12 MED ORDER — FENTANYL CITRATE (PF) 100 MCG/2ML IJ SOLN
INTRAMUSCULAR | Status: AC
  Filled 2016-11-12: qty 2

## 2016-11-12 MED ORDER — ONDANSETRON HCL 4 MG/2ML IJ SOLN: 4.0000 mg | Freq: Four times a day (QID) | INTRAMUSCULAR | Status: DC | PRN

## 2016-11-12 MED ORDER — SODIUM CHLORIDE 0.9% FLUSH: 3.0000 mL | Freq: Two times a day (BID) | INTRAVENOUS | Status: DC

## 2016-11-12 MED ORDER — SODIUM CHLORIDE 0.9 % IV SOLN
250.0000 mL | INTRAVENOUS | Status: DC | PRN
Start: 2016-11-12 — End: 2016-11-12

## 2016-11-12 MED ORDER — SODIUM CHLORIDE 0.9 % IR SOLN
80.0000 mg | Status: AC
  Administered 2016-11-12: 80 mg

## 2016-11-12 MED ORDER — CEFAZOLIN SODIUM-DEXTROSE 2-4 GM/100ML-% IV SOLN
2.0000 g | INTRAVENOUS | Status: AC
  Administered 2016-11-12: 2 g via INTRAVENOUS

## 2016-11-12 MED ORDER — ACETAMINOPHEN 325 MG PO TABS: 325.0000 mg | ORAL_TABLET | ORAL | Status: DC | PRN

## 2016-11-12 MED ORDER — SODIUM CHLORIDE 0.9 % IR SOLN
Status: AC
  Filled 2016-11-12: qty 2

## 2016-11-12 MED ORDER — MUPIROCIN 2 % EX OINT
TOPICAL_OINTMENT | CUTANEOUS | Status: AC
Start: 2016-11-12 — End: 2016-11-12
  Administered 2016-11-12: 14:00:00
  Filled 2016-11-12: qty 22

## 2016-11-12 MED ORDER — MIDAZOLAM HCL 5 MG/5ML IJ SOLN
INTRAMUSCULAR | Status: DC | PRN
  Administered 2016-11-12: 2 mg via INTRAVENOUS
  Administered 2016-11-12: 1 mg via INTRAVENOUS

## 2016-11-12 MED ORDER — SODIUM CHLORIDE 0.9 % IV SOLN
INTRAVENOUS | Status: DC
  Administered 2016-11-12: 14:00:00 via INTRAVENOUS

## 2016-11-12 SURGICAL SUPPLY — 5 items
CABLE SURGICAL S-101-97-12 (CABLE) ×2 IMPLANT
ICD EVERA XT MRI DF1  DDMB1D1 (ICD Generator) ×1 IMPLANT
ICD EVERA XT MRI DF1 DDMB1D1 (ICD Generator) ×1 IMPLANT
PAD DEFIB LIFELINK (PAD) ×2 IMPLANT
TRAY PACEMAKER INSERTION (PACKS) ×2 IMPLANT

## 2016-11-12 NOTE — Progress Notes (Signed)
Follow up EKG called to Dr. Rayann Heman who reviewed and  stated pt was cleared for discharge

## 2016-11-12 NOTE — Discharge Instructions (Signed)
Keep incision clean and dry, no showering for 10 days. No driving for 2 days.  You can remove outer dressing tomorrow. Leave steri-strips (little pieces of tape) on until seen in the office for wound check appointment. Call the office [phone]) for redness, drainage, swelling, or fever.  Pacemaker Battery Change, Care After This sheet gives you information about how to care for yourself after your procedure. Your health care provider may also give you more specific instructions. If you have problems or questions, contact your health care provider. What can I expect after the procedure? After your procedure, it is common to have:  Pain or soreness at the site where the pacemaker was inserted.  Swelling at the site where the pacemaker was inserted.  Follow these instructions at home: Incision care  Keep the incision clean and dry. ? Do not take baths, swim, or use a hot tub until your health care provider approves. ? You may shower the day after your procedure, or as directed by your health care provider. ? Pat the area dry with a clean towel. Do not rub the area. This may cause bleeding.  Follow instructions from your health care provider about how to take care of your incision. Make sure you: ? Wash your hands with soap and water before you change your bandage (dressing). If soap and water are not available, use hand sanitizer. ? Change your dressing as told by your health care provider. ? Leave stitches (sutures), skin glue, or adhesive strips in place. These skin closures may need to stay in place for 2 weeks or longer. If adhesive strip edges start to loosen and curl up, you may trim the loose edges. Do not remove adhesive strips completely unless your health care provider tells you to do that.  Check your incision area every day for signs of infection. Check for: ? More redness, swelling, or pain. ? More fluid or blood. ? Warmth. ? Pus or a bad smell. Activity  Do not lift  anything that is heavier than 10 lb (4.5 kg) until your health care provider says it is okay to do so.  For the first 2 weeks, or as long as told by your health care provider: ? Avoid lifting your left arm higher than your shoulder. ? Be gentle when you move your arms over your head. It is okay to raise your arm to comb your hair. ? Avoid strenuous exercise.  Ask your health care provider when it is okay to: ? Resume your normal activities. ? Return to work or school. ? Resume sexual activity. Eating and drinking  Eat a heart-healthy diet. This should include plenty of fresh fruits and vegetables, whole grains, low-fat dairy products, and lean protein like chicken and fish.  Limit alcohol intake to no more than 1 drink a day for non-pregnant women and 2 drinks a day for men. One drink equals 12 oz of beer, 5 oz of wine, or 1 oz of hard liquor.  Check ingredients and nutrition facts on packaged foods and beverages. Avoid the following types of food: ? Food that is high in salt (sodium). ? Food that is high in saturated fat, like full-fat dairy or red meat. ? Food that is high in trans fat, like fried food. ? Food and drinks that are high in sugar. Lifestyle  Do not use any products that contain nicotine or tobacco, such as cigarettes and e-cigarettes. If you need help quitting, ask your health care provider.  Take steps to  manage and control your weight.  Get regular exercise. Aim for 150 minutes of moderate-intensity exercise (such as walking or yoga) or 75 minutes of vigorous exercise (such as running or swimming) each week.  Manage other health problems, such as diabetes or high blood pressure. Ask your health care provider how you can manage these conditions. General instructions  Do not drive for 24 hours after your procedure if you were given a medicine to help you relax (sedative).  Take over-the-counter and prescription medicines only as told by your health care  provider.  Avoid putting pressure on the area where the pacemaker was placed.  If you need an MRI after your pacemaker has been placed, be sure to tell the health care provider who orders the MRI that you have a pacemaker.  Avoid close and prolonged exposure to electrical devices that have strong magnetic fields. These include: ? Cell phones. Avoid keeping them in a pocket near the pacemaker, and try using the ear opposite the pacemaker. ? MP3 players. ? Household appliances, like microwaves. ? Metal detectors. ? Electric generators. ? High-tension wires.  Keep all follow-up visits as directed by your health care provider. This is important. Contact a health care provider if:  You have pain at the incision site that is not relieved by over-the-counter or prescription medicines.  You have any of these around your incision site or coming from it: ? More redness, swelling, or pain. ? Fluid or blood. ? Warmth to the touch. ? Pus or a bad smell.  You have a fever.  You feel brief, occasional palpitations, light-headedness, or any symptoms that you think might be related to your heart. Get help right away if:  You experience chest pain that is different from the pain at the pacemaker site.  You develop a red streak that extends above or below the incision site.  You experience shortness of breath.  You have palpitations or an irregular heartbeat.  You have light-headedness that does not go away quickly.  You faint or have dizzy spells.  Your pulse suddenly drops or increases rapidly and does not return to normal.  You begin to gain weight and your legs and ankles swell. Summary  After your procedure, it is common to have pain, soreness, and some swelling where the pacemaker was inserted.  Make sure to keep your incision clean and dry. Follow instructions from your health care provider about how to take care of your incision.  Check your incision every day for signs of  infection, such as more pain or swelling, pus or a bad smell, warmth, or leaking fluid and blood.  Avoid strenuous exercise and lifting your left arm higher than your shoulder for 2 weeks, or as long as told by your health care provider. This information is not intended to replace advice given to you by your health care provider. Make sure you discuss any questions you have with your health care provider. Document Released: 02/10/2013 Document Revised: 03/14/2016 Document Reviewed: 03/14/2016 Elsevier Interactive Patient Education  2017 [employer].

## 2016-11-12 NOTE — H&P (Signed)
PCP:  HODGES,FRANCISCO, MD Primary Cardiologist:  Dr Aundra Dubin  The patient presents today for ICD generator change.  Since last being seen in our clinic, the patient reports doing reasonably well.  He is recovering from recent PCI for CTO of RCA.   No further VT.  Denies CP.  SOB is stable.  Today, he denies symptoms of orthopnea, PND, lower extremity edema, dizziness, presyncope, syncope, or neurologic sequela.  The patient feels that he is tolerating medications without difficulties and is otherwise without complaint today.       Past Medical History:  Diagnosis Date  . Abnormal PFTs 7/11   FVC 74%, FEV1 80%, ratio 75%, TLC 78%, DLCO 68%. this suggests a mild restrictive and obstructive defect. pt did have a response to bronchodilator. these PFTs were significantly better than the report from Dr. Alcide Clever in [place] done prior.   [name] AICD (automatic cardioverter/defibrillator) present   . Anxiety   . Atrial flutter ([place])    s/p isthmus ablation 5/10  . CAD (coronary artery disease)    hx of silent MI in 1993. likely an inferior MI. hx of 2D cardiogram in 3/09 showing EF of 40%. hx of myoview in [place] 3/09-nml. presented to [facility] 5/10 with VT and mildly elevated cardiac enzymes LHC (5/10): inferobasal dyskinesis with EF 35-40%. was chronic total occlusion of mid RCA with good collaterals. luminals LCA. this does not appear to be an acute cause of the 5/10 event  . Cancer ([place])    skin  . CHF (congestive heart failure) ([place])   . Chronic lower back pain   . HLD (hyperlipidemia)   . HTN (hypertension)   . Ischemic cardiomyopathy    EF 35-40% by LV-gram 5/10 with inferobasal dyskinesis. echo 5/10 showed EF 40% w/mild LVH, no sig. MR, inferobasal and posterobasal akinesis. echo (7/11): EF 50%, mild LVH, basal-mid inferoposterior akenesis  . Migraine    "visual migraine; maybe 12/year" (07/16/2016)  . PAD (peripheral artery disease) ([facility])    s/p L-to-R fem-fem bypass performed by  Dr. Scot Dock at [facility] in 2009   . Rheumatoid arthritis ([facility])    on leflunomide  . Silent myocardial infarction 1993   "silent"  . Tobacco abuse    47 pack year hx; quit 8/09  . Ventricular tachycardia ([place])    likely scar-mediated. 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 He has duel chamber Medtronic ICD        Past Surgical History:  Procedure Laterality Date  . CARDIAC DEFIBRILLATOR PLACEMENT  09/22/2008  . CATARACT EXTRACTION W/ INTRAOCULAR LENS  IMPLANT, BILATERAL Bilateral 05/2016 - 06/2016  . CORONARY CTO INTERVENTION  07/16/2016  . CORONARY CTO INTERVENTION N/A 07/16/2016   Procedure: Coronary CTO Intervention;  Surgeon: Belva Crome, MD;  Location: [place] [facility];  Service: Cardiovascular;  Laterality: N/A;  . EP IMPLANTABLE DEVICE  09/22/08   Medtronic, ICD Model Number:  [id number], ICD Serial Number: [id number]  . FEMORAL ARTERY - FEMORAL ARTERY BYPASS GRAFT  march 2009   left to right bypass, first @ [facility], second at [facility] by Dr Scot Dock  . RIGHT/LEFT HEART CATH AND CORONARY ANGIOGRAPHY N/A 07/12/2016   Procedure: Right/Left Heart Cath and Coronary Angiography;  Surgeon: Larey Dresser, MD;  Location: [place] [facility];  Service: Cardiovascular;  Laterality: N/A;  . TONSILLECTOMY    . VASECTOMY    . VENTRICULAR ABLATION SURGERY  09/2008          Current  Outpatient Prescriptions  Medication Sig Dispense Refill  . amiodarone (PACERONE) 200 MG tablet Take 1 tablet (200 mg total) by mouth daily. 90 tablet 3  . aspirin EC 81 MG tablet Take 1 tablet (81 mg total) by mouth daily.    . carvedilol (COREG) 25 MG tablet Take 1 tablet (25 mg total) by mouth 2 (two) times daily with a meal. 180 tablet 3  . chlorhexidine (PERIDEX) 0.12 % solution Swish and rinse as directed  3  . clopidogrel (PLAVIX) 75 MG tablet Take 1 tablet (75 mg total) by mouth daily with breakfast. Please prescribe generic 90 tablet 3  .  ezetimibe (ZETIA) 10 MG tablet Take 1 tablet (10 mg total) by mouth daily. 90 tablet 3  . fluticasone (FLONASE) 50 MCG/ACT nasal spray Place 1-2 sprays into both nostrils daily as needed for allergies or rhinitis.    [name] leflunomide (ARAVA) 20 MG tablet Take 20 mg by mouth daily.      . Melatonin 10 MG TABS Take 10 mg by mouth daily as needed (sleep).    Vladimir Faster Glycol-Propyl Glycol (SYSTANE OP) Apply 1 drop to eye daily as needed (dry eyes).    . simvastatin (ZOCOR) 20 MG tablet Take 1 tablet (20 mg total) by mouth at bedtime. 90 tablet 3  . valsartan (DIOVAN) 160 MG tablet Take 1 tablet (160 mg total) by mouth 2 (two) times daily. 180 tablet 3  . torsemide (DEMADEX) 20 MG tablet Take 1 tablet (20 mg total) by mouth daily. 90 tablet 3   No current facility-administered medications for this visit.          Allergies  Allergen Reactions  . Atorvastatin Other (See Comments)    Muscle pain  . Crestor [Rosuvastatin Calcium] Other (See Comments)    Muscle pain  . Losartan Other (See Comments)    Muscle pain   . Telmisartan Other (See Comments)    Stomach ache    Social History        Social History  . Marital status: Single    Spouse name: N/A  . Number of children: N/A  . Years of education: N/A      Occupational History  . Not on file.         Social History Main Topics  . Smoking status: Former Smoker    Packs/day: 1.00    Years: 47.00    Quit date: 2009  . Smokeless tobacco: Never Used  . Alcohol use 0.0 oz/week     Comment: 07/15/2016 "nothing for the last couple years"  . Drug use: No     Comment: prior   . Sexual activity: Not on file       Other Topics Concern  . Not on file      Social History Narrative   Single; gets minimal exercise; 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 originally from [place]    Physical Exam: Vitals:   11/12/16 1230  BP: 111/69  Pulse: (!) 51  Resp: 16  Temp: 98.4 F (36.9 C)    GEN- The  patient is thin appearing, alert and oriented x 3 today.  more pleasant today. Head- normocephalic, atraumatic Eyes-  Sclera clear, conjunctiva pink Ears- hearing intact Oropharynx- clear Neck- supple, no JVP Lungs- Clear to ausculation bilaterally, normal work of breathing Chest- ICD pocket is well healed Heart- Regular rate and rhythm  GI- soft, NT, ND, + BS Extremities- no clubbing, cyanosis, or edema  ICD interrogation- personally reviewed in detail today,  See PACEART report ekg today reveal sinus with PVCs, first degree AV block, IVCD  Assessment and Plan:  1. VT Stable ICD is has reached ERI.  Given IVCD, I do not feel that he would benefit from upgrade to CRT presently. Risks, benefits, and alternatives to ICD pulse generator replacement were discussed in detail today.  The patient understands that risks include but are not limited to bleeding, infection, pneumothorax, perforation, tamponade, vascular damage, renal failure, MI, stroke, death, inappropriate shocks, damage to his existing leads, and lead dislodgement and wishes to proceed at this time.   ICD Criteria  Current LVEF:30%. Within 12 months prior to implant: Yes   Heart failure history: Yes, Class III  Cardiomyopathy history: Yes, Ischemic Cardiomyopathy - Prior MI.  Atrial Fibrillation/Atrial Flutter: No.  Ventricular tachycardia history: monomorphic unstable sustained ventricular tachycardia  Cardiac arrest history: No.  History of syndromes with risk of sudden death: No.  Previous ICD: Yes, Reason for ICD:  Primary prevention.  Current ICD indication: Secondary  PPM indication: yes, sinus bradycardia   Class I or II Bradycardia indication present: Yes  Beta Blocker therapy for 3 or more months: Yes, prescribed.   Ace Inhibitor/ARB therapy for 3 or more months: Yes, prescribed.

## 2016-11-13 ENCOUNTER — Encounter (HOSPITAL_COMMUNITY): Payer: Self-pay | Admitting: Internal Medicine

## 2016-11-13 MED FILL — Fentanyl Citrate Preservative Free (PF) Inj 100 MCG/2ML: INTRAMUSCULAR | Qty: 2 | Status: AC

## 2016-11-21 ENCOUNTER — Ambulatory Visit: Payer: Medicare Other

## 2016-11-22 ENCOUNTER — Ambulatory Visit (INDEPENDENT_AMBULATORY_CARE_PROVIDER_SITE_OTHER): Payer: Medicare Other | Admitting: *Deleted

## 2016-11-22 DIAGNOSIS — I255 Ischemic cardiomyopathy: Secondary | ICD-10-CM

## 2016-11-22 LAB — CUP PACEART INCLINIC DEVICE CHECK
Battery Remaining Longevity: 124 mo
Brady Statistic AP VS Percent: 48.24 %
Brady Statistic RV Percent Paced: 10.56 %
Date Time Interrogation Session: 20180720102118
HIGH POWER IMPEDANCE MEASURED VALUE: 39 Ohm
HIGH POWER IMPEDANCE MEASURED VALUE: 57 Ohm
Implantable Lead Implant Date: 20100520
Implantable Lead Location: 753860
Implantable Lead Model: 5076
Implantable Pulse Generator Implant Date: 20180710
Lead Channel Impedance Value: 266 Ohm
Lead Channel Pacing Threshold Amplitude: 0.5 V
Lead Channel Pacing Threshold Amplitude: 1.25 V
Lead Channel Pacing Threshold Pulse Width: 0.4 ms
Lead Channel Setting Pacing Amplitude: 1.5 V
Lead Channel Setting Pacing Pulse Width: 0.4 ms
MDC IDC LEAD IMPLANT DT: 20100520
MDC IDC LEAD LOCATION: 753859
MDC IDC MSMT BATTERY VOLTAGE: 3.15 V
MDC IDC MSMT LEADCHNL RA IMPEDANCE VALUE: 399 Ohm
MDC IDC MSMT LEADCHNL RA PACING THRESHOLD PULSEWIDTH: 0.4 ms
MDC IDC MSMT LEADCHNL RA SENSING INTR AMPL: 0.9 mV
MDC IDC MSMT LEADCHNL RV IMPEDANCE VALUE: 342 Ohm
MDC IDC MSMT LEADCHNL RV SENSING INTR AMPL: 6 mV
MDC IDC SET LEADCHNL RV PACING AMPLITUDE: 2.75 V
MDC IDC SET LEADCHNL RV SENSING SENSITIVITY: 0.3 mV
MDC IDC STAT BRADY AP VP PERCENT: 9.38 %
MDC IDC STAT BRADY AS VP PERCENT: 3.16 %
MDC IDC STAT BRADY AS VS PERCENT: 39.23 %
MDC IDC STAT BRADY RA PERCENT PACED: 49.98 %

## 2016-11-22 NOTE — Progress Notes (Signed)
Wound check appointment s/p gen change. Steri-strips removed. Wound without redness or edema. Medial Incision edge superficially unapproximated, steri strips reapplied, patient educated on wound care (including signs or symptoms of infection), patient scheduled for wound recheck 11/28/16. Normal device function. Thresholds, sensing, and impedances consistent with implant measurements. Device programmed at chronic outputs s/p gen change. Histogram distribution appropriate for patient and level of activity. No mode switches or ventricular arrhythmias noted. Patient c/o of "feeling more pacing" and dizzy spells, PVC count increase noted, will review with JA. Patient educated about wound care, arm mobility, lifting restrictions, shock plan. Wound re-check 11/28/16, ROV with JA 02/14/17.

## 2016-11-25 ENCOUNTER — Encounter: Payer: Medicare Other | Admitting: Internal Medicine

## 2016-11-28 ENCOUNTER — Ambulatory Visit (INDEPENDENT_AMBULATORY_CARE_PROVIDER_SITE_OTHER): Payer: Self-pay | Admitting: *Deleted

## 2016-11-28 DIAGNOSIS — I255 Ischemic cardiomyopathy: Secondary | ICD-10-CM

## 2016-11-28 NOTE — Progress Notes (Signed)
Wound re-check appointment. Steri-strips removed. Wound without redness, edema, or drainage. Incision edges approximated, wound well healed. Patient educated on wound care including signs and symptoms of infection. ROV with JA 02/14/17.

## 2016-12-04 DIAGNOSIS — C44622 Squamous cell carcinoma of skin of right upper limb, including shoulder: Secondary | ICD-10-CM | POA: Diagnosis not present

## 2016-12-13 ENCOUNTER — Telehealth (HOSPITAL_COMMUNITY): Payer: Self-pay | Admitting: Pharmacist

## 2016-12-13 NOTE — Telephone Encounter (Signed)
Mr. Ord called to inform me that his valsartan is made by one of the manufacturers that was recalled and was wanting a new Rx for an alternative. He is still not willing to try Entresto at this time because he thinks it will be the next valsartan containing product to be recalled. We reviewed some of the alternatives he could try that he does not have an intolerance to and he stated that he would be willing to switch to lisinopril which is on his insurance's formulary. I have sent a message to Dr. Aundra Dubin for recommendation.   Ruta Hinds. Velva Harman, PharmD, BCPS, CPP Clinical Pharmacist Pager: [phone] Phone: [phone] 12/13/2016 2:20 PM

## 2016-12-16 MED ORDER — ENALAPRIL MALEATE 5 MG PO TABS: 5.0000 mg | ORAL_TABLET | Freq: Two times a day (BID) | ORAL | 1 refills | Status: DC

## 2016-12-16 NOTE — Telephone Encounter (Signed)
Per discussion with Dr. Aundra Dubin, ok to switch to alternative ACEi. Mr. Kowalewski now states that he did try lisinopril in the past which caused a cough. Enalapril is on his insurance formulary though and he would like to try the enalapril at this time. I did advise him that he may still have a cough with enalapril but he is willing to try it.   Ruta Hinds. Velva Harman, PharmD, BCPS, CPP Clinical Pharmacist Pager: [phone] Phone: [phone] 12/16/2016 9:36 AM

## 2016-12-16 NOTE — Addendum Note (Signed)
Addended by: Adora Fridge on: 12/16/2016 09:39 AM   Modules accepted: Orders

## 2016-12-17 DIAGNOSIS — C44622 Squamous cell carcinoma of skin of right upper limb, including shoulder: Secondary | ICD-10-CM | POA: Diagnosis not present

## 2016-12-23 ENCOUNTER — Telehealth (HOSPITAL_COMMUNITY): Payer: Self-pay | Admitting: Pharmacist

## 2016-12-23 MED ORDER — VALSARTAN-HYDROCHLOROTHIAZIDE 160-12.5 MG PO TABS: 1.0000 | ORAL_TABLET | Freq: Two times a day (BID) | ORAL | 5 refills | Status: DC

## 2016-12-23 NOTE — Telephone Encounter (Signed)
Received a fax from [facility] stating that Mr. Landers had an upset stomach after switch to enalapril. They asked if he could switch to valsartan/hctz and stop torsemide. Spoke with Dr. Aundra Dubin who agreed with switch to valsartan/hctz as long as it is not one of the recalled manufacturers but patient should NOT d/c torsemide as hctz is not a potent diuretic. Will send notification back to pharmacy along with new Rx.   Ruta Hinds. Velva Harman, PharmD, BCPS, CPP Clinical Pharmacist Pager: [phone] Phone: [phone] 12/23/2016 10:54 AM

## 2016-12-24 ENCOUNTER — Telehealth: Payer: Self-pay | Admitting: Interventional Cardiology

## 2016-12-24 NOTE — Telephone Encounter (Signed)
Will route to billing dept.

## 2016-12-24 NOTE — Telephone Encounter (Signed)
New message    Nathan Pruitt is calling stating he got a charge for $180 because when Dr. Tamala Julian did a procedure it was filed as outpatient. Nathan Pruitt states he already spoke with insurance, and they told him to call and ask the doctor because it would be up to the doctor. Please call.

## 2017-01-13 DIAGNOSIS — M0589 Other rheumatoid arthritis with rheumatoid factor of multiple sites: Secondary | ICD-10-CM | POA: Diagnosis not present

## 2017-01-13 DIAGNOSIS — R361 Hematospermia: Secondary | ICD-10-CM | POA: Diagnosis not present

## 2017-01-13 DIAGNOSIS — Z79899 Other long term (current) drug therapy: Secondary | ICD-10-CM | POA: Diagnosis not present

## 2017-01-13 DIAGNOSIS — N3020 Other chronic cystitis without hematuria: Secondary | ICD-10-CM | POA: Diagnosis not present

## 2017-01-17 ENCOUNTER — Other Ambulatory Visit (HOSPITAL_COMMUNITY): Payer: Self-pay | Admitting: Cardiology

## 2017-01-28 DIAGNOSIS — H26491 Other secondary cataract, right eye: Secondary | ICD-10-CM | POA: Diagnosis not present

## 2017-02-03 DIAGNOSIS — Z79899 Other long term (current) drug therapy: Secondary | ICD-10-CM | POA: Diagnosis not present

## 2017-02-03 DIAGNOSIS — M25571 Pain in right ankle and joints of right foot: Secondary | ICD-10-CM | POA: Diagnosis not present

## 2017-02-03 DIAGNOSIS — M0589 Other rheumatoid arthritis with rheumatoid factor of multiple sites: Secondary | ICD-10-CM | POA: Diagnosis not present

## 2017-02-03 DIAGNOSIS — Z8739 Personal history of other diseases of the musculoskeletal system and connective tissue: Secondary | ICD-10-CM | POA: Diagnosis not present

## 2017-02-03 DIAGNOSIS — M150 Primary generalized (osteo)arthritis: Secondary | ICD-10-CM | POA: Diagnosis not present

## 2017-02-10 DIAGNOSIS — Z23 Encounter for immunization: Secondary | ICD-10-CM | POA: Diagnosis not present

## 2017-02-10 DIAGNOSIS — N3020 Other chronic cystitis without hematuria: Secondary | ICD-10-CM | POA: Diagnosis not present

## 2017-02-10 DIAGNOSIS — R361 Hematospermia: Secondary | ICD-10-CM | POA: Diagnosis not present

## 2017-02-10 DIAGNOSIS — R972 Elevated prostate specific antigen [PSA]: Secondary | ICD-10-CM | POA: Diagnosis not present

## 2017-02-14 ENCOUNTER — Ambulatory Visit: Payer: Medicare Other | Admitting: Internal Medicine

## 2017-02-14 ENCOUNTER — Ambulatory Visit (HOSPITAL_COMMUNITY)
Admission: RE | Admit: 2017-02-14 | Discharge: 2017-02-14 | Disposition: A | Discharge status: Home / Self Care | Payer: Medicare Other | Source: Ambulatory Visit | Attending: Cardiology | Admitting: Cardiology

## 2017-02-14 ENCOUNTER — Encounter (HOSPITAL_COMMUNITY): Payer: Self-pay | Admitting: Cardiology

## 2017-02-14 ENCOUNTER — Encounter: Payer: Self-pay | Admitting: Internal Medicine

## 2017-02-14 ENCOUNTER — Ambulatory Visit (INDEPENDENT_AMBULATORY_CARE_PROVIDER_SITE_OTHER): Payer: Medicare Other | Admitting: Internal Medicine

## 2017-02-14 VITALS — BP 120/78 | HR 53 | Ht 70.5 in | Wt 185.0 lb

## 2017-02-14 VITALS — BP 149/72 | HR 49 | Wt 189.0 lb

## 2017-02-14 DIAGNOSIS — I5023 Acute on chronic systolic (congestive) heart failure: Secondary | ICD-10-CM

## 2017-02-14 DIAGNOSIS — I4892 Unspecified atrial flutter: Secondary | ICD-10-CM | POA: Insufficient documentation

## 2017-02-14 DIAGNOSIS — Z800 Family history of malignant neoplasm of digestive organs: Secondary | ICD-10-CM | POA: Diagnosis not present

## 2017-02-14 DIAGNOSIS — Z8249 Family history of ischemic heart disease and other diseases of the circulatory system: Secondary | ICD-10-CM | POA: Diagnosis not present

## 2017-02-14 DIAGNOSIS — F419 Anxiety disorder, unspecified: Secondary | ICD-10-CM | POA: Diagnosis not present

## 2017-02-14 DIAGNOSIS — I255 Ischemic cardiomyopathy: Secondary | ICD-10-CM

## 2017-02-14 DIAGNOSIS — I2582 Chronic total occlusion of coronary artery: Secondary | ICD-10-CM | POA: Insufficient documentation

## 2017-02-14 DIAGNOSIS — Z95810 Presence of automatic (implantable) cardiac defibrillator: Secondary | ICD-10-CM | POA: Diagnosis not present

## 2017-02-14 DIAGNOSIS — I472 Ventricular tachycardia, unspecified: Secondary | ICD-10-CM

## 2017-02-14 DIAGNOSIS — I5022 Chronic systolic (congestive) heart failure: Secondary | ICD-10-CM | POA: Diagnosis not present

## 2017-02-14 DIAGNOSIS — I130 Hypertensive heart and chronic kidney disease with heart failure and stage 1 through stage 4 chronic kidney disease, or unspecified chronic kidney disease: Secondary | ICD-10-CM | POA: Insufficient documentation

## 2017-02-14 DIAGNOSIS — N183 Chronic kidney disease, stage 3 (moderate): Secondary | ICD-10-CM | POA: Insufficient documentation

## 2017-02-14 DIAGNOSIS — I739 Peripheral vascular disease, unspecified: Secondary | ICD-10-CM | POA: Diagnosis not present

## 2017-02-14 DIAGNOSIS — Z7902 Long term (current) use of antithrombotics/antiplatelets: Secondary | ICD-10-CM | POA: Insufficient documentation

## 2017-02-14 DIAGNOSIS — Z79899 Other long term (current) drug therapy: Secondary | ICD-10-CM | POA: Insufficient documentation

## 2017-02-14 DIAGNOSIS — M069 Rheumatoid arthritis, unspecified: Secondary | ICD-10-CM | POA: Diagnosis not present

## 2017-02-14 DIAGNOSIS — I779 Disorder of arteries and arterioles, unspecified: Secondary | ICD-10-CM

## 2017-02-14 DIAGNOSIS — Z87891 Personal history of nicotine dependence: Secondary | ICD-10-CM | POA: Diagnosis not present

## 2017-02-14 DIAGNOSIS — E785 Hyperlipidemia, unspecified: Secondary | ICD-10-CM | POA: Diagnosis not present

## 2017-02-14 DIAGNOSIS — I2510 Atherosclerotic heart disease of native coronary artery without angina pectoris: Secondary | ICD-10-CM | POA: Diagnosis not present

## 2017-02-14 DIAGNOSIS — R946 Abnormal results of thyroid function studies: Secondary | ICD-10-CM | POA: Insufficient documentation

## 2017-02-14 DIAGNOSIS — Z9889 Other specified postprocedural states: Secondary | ICD-10-CM | POA: Insufficient documentation

## 2017-02-14 DIAGNOSIS — Z7982 Long term (current) use of aspirin: Secondary | ICD-10-CM | POA: Insufficient documentation

## 2017-02-14 DIAGNOSIS — I10 Essential (primary) hypertension: Secondary | ICD-10-CM | POA: Diagnosis not present

## 2017-02-14 DIAGNOSIS — I252 Old myocardial infarction: Secondary | ICD-10-CM | POA: Diagnosis not present

## 2017-02-14 DIAGNOSIS — I2589 Other forms of chronic ischemic heart disease: Secondary | ICD-10-CM

## 2017-02-14 LAB — CUP PACEART INCLINIC DEVICE CHECK
Battery Remaining Longevity: 125 mo
Brady Statistic AS VP Percent: 1.22 %
Brady Statistic RA Percent Paced: 44.03 %
Date Time Interrogation Session: 20181012125310
HIGH POWER IMPEDANCE MEASURED VALUE: 37 Ohm
HIGH POWER IMPEDANCE MEASURED VALUE: 47 Ohm
Implantable Lead Implant Date: 20100520
Implantable Lead Location: 753860
Implantable Lead Model: 5076
Implantable Lead Model: 6947
Implantable Pulse Generator Implant Date: 20180710
Lead Channel Impedance Value: 266 Ohm
Lead Channel Pacing Threshold Amplitude: 0.5 V
Lead Channel Pacing Threshold Amplitude: 1.25 V
Lead Channel Setting Pacing Amplitude: 1.5 V
Lead Channel Setting Pacing Amplitude: 2.5 V
Lead Channel Setting Sensing Sensitivity: 0.3 mV
MDC IDC LEAD IMPLANT DT: 20100520
MDC IDC LEAD LOCATION: 753859
MDC IDC MSMT BATTERY VOLTAGE: 3.13 V
MDC IDC MSMT LEADCHNL RA IMPEDANCE VALUE: 380 Ohm
MDC IDC MSMT LEADCHNL RA PACING THRESHOLD PULSEWIDTH: 0.4 ms
MDC IDC MSMT LEADCHNL RA SENSING INTR AMPL: 0.875 mV
MDC IDC MSMT LEADCHNL RA SENSING INTR AMPL: 1.625 mV
MDC IDC MSMT LEADCHNL RV IMPEDANCE VALUE: 323 Ohm
MDC IDC MSMT LEADCHNL RV PACING THRESHOLD PULSEWIDTH: 0.4 ms
MDC IDC MSMT LEADCHNL RV SENSING INTR AMPL: 4.875 mV
MDC IDC MSMT LEADCHNL RV SENSING INTR AMPL: 5.5 mV
MDC IDC SET LEADCHNL RV PACING PULSEWIDTH: 0.4 ms
MDC IDC STAT BRADY AP VP PERCENT: 3.84 %
MDC IDC STAT BRADY AP VS PERCENT: 49.37 %
MDC IDC STAT BRADY AS VS PERCENT: 45.57 %
MDC IDC STAT BRADY RV PERCENT PACED: 4.31 %

## 2017-02-14 LAB — TSH: TSH: 5.672 u[IU]/mL — ABNORMAL HIGH (ref 0.350–4.500)

## 2017-02-14 LAB — COMPREHENSIVE METABOLIC PANEL
ALT: 22 U/L (ref 17–63)
AST: 23 U/L (ref 15–41)
Albumin: 3.6 g/dL (ref 3.5–5.0)
Alkaline Phosphatase: 98 U/L (ref 38–126)
Anion gap: 5 (ref 5–15)
BUN: 15 mg/dL (ref 6–20)
CHLORIDE: 110 mmol/L (ref 101–111)
CO2: 24 mmol/L (ref 22–32)
CREATININE: 1.44 mg/dL — AB (ref 0.61–1.24)
Calcium: 8.6 mg/dL — ABNORMAL LOW (ref 8.9–10.3)
GFR calc Af Amer: 55 mL/min — ABNORMAL LOW (ref >60)
GFR calc non Af Amer: 47 mL/min — ABNORMAL LOW (ref >60)
GLUCOSE: 110 mg/dL — AB (ref 65–99)
Potassium: 4.9 mmol/L (ref 3.5–5.1)
SODIUM: 139 mmol/L (ref 135–145)
Total Bilirubin: 0.7 mg/dL (ref 0.3–1.2)
Total Protein: 6.4 g/dL — ABNORMAL LOW (ref 6.5–8.1)

## 2017-02-14 LAB — T4, FREE: FREE T4: 1.12 ng/dL (ref 0.61–1.12)

## 2017-02-14 MED ORDER — FUROSEMIDE 40 MG PO TABS: 40.0000 mg | ORAL_TABLET | Freq: Every day | ORAL | 3 refills | Status: DC

## 2017-02-14 NOTE — Patient Instructions (Signed)
Medication Instructions:  Your physician recommends that you continue on your current medications as directed. Please refer to the Current Medication list given to you today.   Labwork: None ordered   Testing/Procedures: None ordered   Follow-Up: Your physician wants you to follow-up in: 6 months with Chanetta Marshall, NP and Dr. Rayann Heman in 12 months. You will receive a reminder letter in the mail two months in advance. If you don't receive a letter, please call our office to schedule the follow-up appointment.   Remote monitoring is used to monitor your Pacemaker  from home. This monitoring reduces the number of office visits required to check your device to one time per year. It allows [place] to keep an eye on the functioning of your device to ensure it is working properly. You are scheduled for a device check from home on 05/19/2017. You may send your transmission at any time that day. If you have a wireless device, the transmission will be sent automatically. After your physician reviews your transmission, you will receive a postcard with your next transmission date.    Any Other Special Instructions Will Be Listed Below (If Applicable).     If you need a refill on your cardiac medications before your next appointment, please call your pharmacy.

## 2017-02-14 NOTE — Progress Notes (Signed)
PCP: Ronita Hipps, MD Primary Cardiologist:  Dr Aundra Dubin Primary EP: Dr Rayann Heman  Nathan Pruitt is a 71 y.o. male who presents today for routine electrophysiology followup.  Since his generator change, the patient reports doing very well.  He has recently stopped his diuretic due to cough.  Since then his optivol has elevated.  Today, he denies symptoms of palpitations, chest pain, shortness of breath,  lower extremity edema, dizziness, presyncope, syncope, or ICD shocks.  The patient is otherwise without complaint today.   Past Medical History:  Diagnosis Date  . Abnormal PFTs 7/11   FVC 74%, FEV1 80%, ratio 75%, TLC 78%, DLCO 68%. this suggests a mild restrictive and obstructive defect. pt did have a response to bronchodilator. these PFTs were significantly better than the report from Dr. Alcide Clever in [place] done prior.   [name] AICD (automatic cardioverter/defibrillator) present   . Anxiety   . Atrial flutter ([place])    s/p isthmus ablation 5/10  . CAD (coronary artery disease)    hx of silent MI in 1993. likely an inferior MI. hx of 2D cardiogram in 3/09 showing EF of 40%. hx of myoview in [place] 3/09-nml. presented to [facility] 5/10 with VT and mildly elevated cardiac enzymes LHC (5/10): inferobasal dyskinesis with EF 35-40%. was chronic total occlusion of mid RCA with good collaterals. luminals LCA. this does not appear to be an acute cause of the 5/10 event  . Cancer ([place])    skin  . CHF (congestive heart failure) ([place])   . Chronic lower back pain   . HLD (hyperlipidemia)   . HTN (hypertension)   . Ischemic cardiomyopathy    EF 35-40% by LV-gram 5/10 with inferobasal dyskinesis. echo 5/10 showed EF 40% w/mild LVH, no sig. MR, inferobasal and posterobasal akinesis. echo (7/11): EF 50%, mild LVH, basal-mid inferoposterior akenesis  . Migraine    "visual migraine; maybe 12/year" (07/16/2016)  . PAD (peripheral artery disease) ([facility])    s/p L-to-R fem-fem bypass performed by Dr. Scot Dock at [facility]  in 2009   . Rheumatoid arthritis ([facility])    on leflunomide  . Silent myocardial infarction [facility]) 1993   "silent"  . Tobacco abuse    47 pack year hx; quit 8/09  . Ventricular tachycardia ([place])    likely scar-mediated. 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 He has duel chamber Medtronic ICD   Past Surgical History:  Procedure Laterality Date  . CARDIAC DEFIBRILLATOR PLACEMENT  09/22/2008  . CATARACT EXTRACTION W/ INTRAOCULAR LENS  IMPLANT, BILATERAL Bilateral 05/2016 - 06/2016  . CORONARY CTO INTERVENTION  07/16/2016  . CORONARY CTO INTERVENTION N/A 07/16/2016   Procedure: Coronary CTO Intervention;  Surgeon: Belva Crome, MD;  Location: [place] [facility];  Service: Cardiovascular;  Laterality: N/A;  . EP IMPLANTABLE DEVICE  09/22/08   Medtronic, ICD Model Number:  [id number], ICD Serial Number: [id number]  . FEMORAL ARTERY - FEMORAL ARTERY BYPASS GRAFT  march 2009   left to right bypass, first @ [facility], second at [facility] by Dr Scot Dock  . ICD GENERATOR CHANGEOUT N/A 11/12/2016   Procedure: ICD Generator Changeout;  Surgeon: Thompson Grayer, MD;  Location: [place] [facility];  Service: Cardiovascular;  Laterality: N/A;  . RIGHT/LEFT HEART CATH AND CORONARY ANGIOGRAPHY N/A 07/12/2016   Procedure: Right/Left Heart Cath and Coronary Angiography;  Surgeon: Larey Dresser, MD;  Location: [place] [facility];  Service: Cardiovascular;  Laterality: N/A;  . TONSILLECTOMY    .  VASECTOMY    . VENTRICULAR ABLATION SURGERY  09/2008    ROS- all systems are reviewed and negative except as per HPI above  Current Outpatient Prescriptions  Medication Sig Dispense Refill  . amiodarone (PACERONE) 200 MG tablet Take 1 tablet (200 mg total) by mouth daily. 90 tablet 3  . aspirin EC 81 MG tablet Take 1 tablet (81 mg total) by mouth daily.    . carvedilol (COREG) 25 MG tablet Take 1 tablet (25 mg total) by mouth 2 (two) times daily with a meal. 180 tablet 3  . chlorhexidine  (PERIDEX) 0.12 % solution Swish and rinse as needed for dental  3  . clopidogrel (PLAVIX) 75 MG tablet Take 1 tablet (75 mg total) by mouth daily with breakfast. Please prescribe generic 90 tablet 3  . ezetimibe (ZETIA) 10 MG tablet TAKE 1 TABLET (10 MG TOTAL) BY MOUTH DAILY. 90 tablet 2  . fluticasone (FLONASE) 50 MCG/ACT nasal spray Place 1-2 sprays into both nostrils daily as needed for allergies or rhinitis.    [name] leflunomide (ARAVA) 20 MG tablet Take 20 mg by mouth daily.      . Melatonin 10 MG TABS Take 10 mg by mouth at bedtime as needed (sleep).     . simvastatin (ZOCOR) 20 MG tablet Take 1 tablet (20 mg total) by mouth at bedtime. 90 tablet 3  . valsartan-hydrochlorothiazide (DIOVAN-HCT) 160-12.5 MG tablet Take 0.5 tablets by mouth 2 (two) times daily.     No current facility-administered medications for this visit.     Physical Exam: Vitals:   02/14/17 0851  BP: 120/78  Pulse: (!) 53  SpO2: 97%  Weight: 185 lb (83.9 kg)  Height: 5' 10.5" (1.791 m)    GEN- The patient is well appearing, alert and oriented x 3 today.   Head- normocephalic, atraumatic Eyes-  Sclera clear, conjunctiva pink Ears- hearing intact Oropharynx- clear Lungs- + bibasilar rales, normal work of breathing Chest- ICD pocket is well healed Heart- Regular rate and rhythm, no murmurs, rubs or gallops, PMI not laterally displaced GI- soft, NT, ND, + BS Extremities- no clubbing, cyanosis, or edema  ICD interrogation- reviewed in detail today,  See PACEART report  ekg tracing ordered today is personally reviewed and shows atrial paced rhythm, PR 306 msec, PVCs  Assessment and Plan:  1.  Acute on chronic systolic dysfunction  mildy volume overloaded on exam,  This correlates to elevated optivol and is likely due to him recently stopping his diuretic. I have encouraged him to discuss options for diuresis with Dr Aundra Dubin when he sees him later today. Normal ICD function See Claudia Desanctis Art report No changes  today  2. VT Well controlled I spoke with Dr Aundra Dubin who will check TFTs, LFTs today Should probably get PFTs also in the near future  3. CAD/ ischemic CM Stable s/p PCI for CTO of RCA No change required today  4. HTN Stable No change required today  5. Abnormal TFTs I have been very clear previously that he should follow-up with his PCP regarding this previously.  We will repeat labs today with Dr Aundra Dubin   carelink Return to see EP NP in 6 months  Thompson Grayer MD, [facility] 02/14/2017 9:17 AM

## 2017-02-14 NOTE — Patient Instructions (Signed)
Labs today (will call for abnormal results, otherwise no news is good news)  Take Lasix 40 mg (1 Tablet) in the AM and 20 mg (0.5 Tablet) in the PM for 3 DAYS ONLY.  Then start taking 40 mg (1 Tablet) once Daily.  Labs in 10 days (bmet)  Follow up in 6 weeks.

## 2017-02-15 LAB — T3, FREE: T3, Free: 2.4 pg/mL (ref 2.0–4.4)

## 2017-02-16 NOTE — Progress Notes (Signed)
Patient ID: Nathan Pruitt, male   DOB: 1945-08-05, 71 y.o.   MRN: [id number]     Advanced Heart Failure Clinic Note   PCP: Dr. Helene Kelp  71 yo with history of CAD, PAD, ischemic CMP, and VT s/p ICD placement returns for followup of CHF.  Echo in 2/17 showed EF 40% and regional WMAs.  In 3/18, he developed episodes of VT as well as increased dyspnea.  LHC/RHC was done, showing new occlusion of a large ramus (CTO, probably a month or so old at time of cath) and old CTO RCA with collaterals.  He had DES x 3 to ramus.  Filling pressures were optimized on RHC.  Amiodarone was increased to 200 mg daily from 100 mg daily.  No VT since PCI.   For the last 1.5 months, patient has been off his torsemide.  He thinks that it was making him cough.  He says that the cough improved off torsemide.  However, weight is up 11 lbs.  He is short of breath with heavier exertion like carrying a load or walking up a hill or stairs.  He has noted orthopnea recently.  No chest pain. No lightheadedness/syncope.     Optivol: fluid index > threshold with low impedance.   Labs (11/10): K 4.9, creatinine 1.4, LDL 70, HDL 31, LFTs normal Labs (3/11): K 4.4, creatinine 1.3, LDL 61, HDL 35 Labs (7/11): LDL 62, HDL 23 Labs (1/12): K 4.8, LFTs normal, creatinine 1.44, LDL 69, HDL 33 Labs (7/12): LFTs normal, LDL 81, HDL 36, HCT normal, K 4.3, creatinine 1.3, BNP 116, TSH normal Labs (1/13): LDL 39, HDl 31, LFTs normal, TSH  Labs (7/13): TSH normal, LFTs normal, LDL 69, HDL 40 Labs (8/13): K 4.5, creatinine 1.6 Labs (10/13): K 4.4, creatinine 1.4 Labs (3/14): LDL 108, HDL 30, LFTs normal Labs (9/14): K 4.2, creatinine 1.6, LFTs normal, LDL 114, TSH 10.6 (elevated), free T4/free T3 normal Labs (2/15): K 4.6, creatinine 1.5, LFTs normal, TSH 7.6 (elevated), free T4 and free T3 normal Labs (3/15): K 3.6, creatinine 1.5, LFTs normal, TSH normal Labs (9/15): K 4.3, creatinine 1.6, LFTs normal, TSH normal, LDL 115, HDL 24 Labs  (10/16): K 4.5, creatinine 1.5, LDL 90, HDL 35, hgb 12.8 Labs (11/16): Low free T3, normal TSH and free T4 Labs (12/16): K 5, creatinine 1.8, LFTs normal, TSH 8.6 (mildly elevated) Labs (1/17): Free T4/TSH/free T3 normal, LFTs normal Labs (2/17): K 4.4, creatinine 2.4, LDL 131, HDL 34 Labs (4/17): K 4.2, creatinine 1.3, LDL 67, HDL 35 Labs (9/17): K 4.9, creatinine 2.2, LDL 54, HDL 35, free T3 and T4 normal, hgb 12 Labs (4/18): K 5, creatinine 1.78, LFTs normal, TSH elevated but free T3 and free T4 normal.  Labs (6/18): LDL 52, HDL 35, TSH elevated 6.8, LFTs normal Labs (7/18): K 4.3, creatinine 1.68  ECG (personally reviewed): a-paced, IVCD 142 msec, PVC  Allergies (verified):  No Known Drug Allergies  Past Medical History: 1. Coronary artery disease. The patient reports history of silent MI in 1993.  This was likely an inferior MI (see below).  - The patient presented to [facility] in 5/10 with VT and mildly elevated cardiac enzymes. LHC (5/10):  Inferobasal dyskinesis with EF 35-40%.  There was chronic total occlusion of the mid RCA with good collaterals.  Luminals LCA.  This did not appear to be an acute cause of the 5/10 event.  - LHC (3/18): Known occlusion of RCA with collaterals, new occlusion of ramus.  DES x 3 to ramus.  2. Hypertension.  3. Hyperlipidemia: Intolerant of simvastatin, Crestor, Lipitor, pravastatin. .  4. Remote tobacco abuse with 47-pack-year history, quitting in August 2009.  5. Peripheral arterial disease.  - Status post left-to-right fem-fem bypass performed in [place] in March 2009.  - Status post redo left-to-right fem-fem bypass performed by Dr. Scot Dock at [facility] in 2009.  - ABIs normal 3/15, ABIs normal 3/16, ABIs normal 3/17, ABIs normal 4/18.  6. Rheumatoid arthritis, on leflunomide.  7.  Ischemic cardiomyopathy:  EF 35-40% by LV-gram 5/10 with inferobasal dyskinesis.  Echo 5/10 showed EF 40% with mild LVH, no significant MR, inferobasal and  posterobasal akinesis.  Echo (7/11): EF 50%, mild LVH, basal-mid inferoposterior akinesis.  Echo (7/12): EF 45-50% with basal anterolateral, basal posterior, and basal to mid inferior akinesis.  Echo (4/16): EF 40-45%, basal to mid inferolateral AK, basal inferior AK, basal to mid anterolateral HK.  Echo (2/17): EF 40% with basal to mid inferior akinesis, basal inferolateral aneurysm, mid inferolateral akinesis, basal anterolateral hypokinesis, normal RV size and systolic function, PASP 25 mmHg.  - Echo (3/18) with EF 30-35%, moderate LV dilation, moderate MR, mildly decreased RV systolic function, PASP 51 mmmHg. - RHC (3/18): mean RA 4, PA 38/12, mean PCWP 17, CI 2.2.  8.  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  He has a dual chamber Medtronic ICD.  9.  Atrial flutter:  Status post isthmus ablation 5/10.   10.  PFTs (7/11): FVC 74%, FEV1 80%, ratio 75%, TLC 78%, DLCO 68%.  This suggests a mild restrictive defect and a mild obstructive defect.  He did have response to bronchodilator. These PFTs were significantly better than the report from Dr. Alcide Clever in [place] done prior.  He had last PFTs in 2/12 with no significant change.  11.  Anxiety 12.  Chronic cough: No relief with change from ACEI to ARB or with trial of PPI.  13.  CKD: Stage 3.   Family History: Mother died in her late 26s with gastric cancer.  Father died in his late 60s with throat cancer and PVD.   He had a brother who died at 30 of an MI.   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 -47ppy hx, quit 2009.  Alcohol Use - yes-minimal Regular Exercise - yes Drug Use - no (prior)  Review of Systems        All systems reviewed and negative except as per HPI.   Current Outpatient Prescriptions  Medication Sig Dispense Refill  . amiodarone (PACERONE) 200 MG tablet Take 1 tablet (200 mg total) by mouth daily.  90 tablet 3  . aspirin EC 81 MG tablet Take 1 tablet (81 mg total) by mouth daily.    . carvedilol (COREG) 25 MG tablet Take 1 tablet (25 mg total) by mouth 2 (two) times daily with a meal. 180 tablet 3  . chlorhexidine (PERIDEX) 0.12 % solution Swish and rinse as needed for dental  3  . clopidogrel (PLAVIX) 75 MG tablet Take 1 tablet (75 mg total) by mouth daily with breakfast. Please prescribe generic 90 tablet 3  . ezetimibe (ZETIA) 10 MG tablet TAKE 1 TABLET (10 MG TOTAL) BY MOUTH DAILY. 90 tablet 2  . fluticasone (FLONASE) 50 MCG/ACT nasal spray Place 1-2 sprays into both nostrils daily as needed for allergies or rhinitis.    [name] leflunomide (ARAVA) 20 MG  tablet Take 20 mg by mouth daily.      . Melatonin 10 MG TABS Take 10 mg by mouth at bedtime as needed (sleep).     . simvastatin (ZOCOR) 20 MG tablet Take 1 tablet (20 mg total) by mouth at bedtime. 90 tablet 3  . valsartan-hydrochlorothiazide (DIOVAN-HCT) 160-12.5 MG tablet Take 0.5 tablets by mouth 2 (two) times daily.    . furosemide (LASIX) 40 MG tablet Take 1 tablet (40 mg total) by mouth daily. 90 tablet 3   No current facility-administered medications for this encounter.     Vitals:   02/14/17 1058  BP: (!) 149/72  Pulse: (!) 49  SpO2: 98%  Weight: 189 lb (85.7 kg)   Wt Readings from Last 3 Encounters:  02/14/17 189 lb (85.7 kg)  02/14/17 185 lb (83.9 kg)  11/12/16 176 lb (79.8 kg)     General: NAD Neck: JVP 8-9 cm with HJR, no thyromegaly or thyroid nodule.  Lungs: Clear to auscultation bilaterally with normal respiratory effort. CV: Nondisplaced PMI.  Heart regular S1/S2, no S3/S4, no murmur.  No peripheral edema.  No carotid bruit.  Normal pedal pulses.  Abdomen: Soft, nontender, no hepatosplenomegaly, no distention.  Skin: Intact without lesions or rashes.  Neurologic: Alert and oriented x 3.  Psych: Normal affect. Extremities: No clubbing or cyanosis.  HEENT: Normal.   Assessment/Plan:  1. Chronic systolic  CHF Ischemic cardiomyopathy.  Medtronic ICD.  Echo (3/18) with EF 30-35%. NYHA class III symptoms.  He is volume overloaded by exam and by Optivol and weight is up.  He has been off torsemide x 1.5 months because he feels like it made him cough. - Continue Coreg 25 mg bid and valsartan/HCT 80/12.5 bid (the valsartan formulation that he used to take was recalled).  - Start Lasix 40 qam/20 qpm x 3 days then Lasix 40 mg daily after that.  BMET today and again in 10 days.  - We have discussed transitioning to [facility], he wants to stay on valsartan (says Delene Loll would send him into donut hole too fast).     - He is off spironolactone (had side effects, does not remember what).  Could consider eplerenone in the future.  2. CAD Denies chest pain.  Had DES x 3 to ramus in 3/18.   - Continue ASA 81 mg daily, clopidogrel, Zocor 20 mg daily, Zetia 10 mg daily, and Coreg/ARB as above.   3. HLD  Stable on Zetia and simvastatin as above without myalgias. Good lipids in 6/18.   4. Hx of VT.   Continue amiodarone 200 mg daily (increased from 100 mg daily after last VT event). Has ICD.  - TSH mildly elevated but free T3 and T4 normal in past. Check TSH, free T3/T4, and LFTs today with amiodarone use.  - Knows to get a yearly eye exam 5. PAD He denies claudication.  Normal ABIs in 4/18, followed by [facility].   Followup in 6 wks.    Loralie Champagne 02/16/2017

## 2017-02-25 ENCOUNTER — Other Ambulatory Visit (HOSPITAL_COMMUNITY): Payer: Self-pay | Admitting: Cardiology

## 2017-02-25 DIAGNOSIS — I5022 Chronic systolic (congestive) heart failure: Secondary | ICD-10-CM | POA: Diagnosis not present

## 2017-02-26 ENCOUNTER — Telehealth (HOSPITAL_COMMUNITY): Payer: Self-pay | Admitting: Pharmacist

## 2017-02-26 MED ORDER — VALSARTAN 160 MG PO TABS: 320.0000 mg | ORAL_TABLET | Freq: Every day | ORAL | 11 refills | Status: DC

## 2017-02-26 NOTE — Telephone Encounter (Signed)
Patient notified me that his manufacturer of valsartan has not been recalled and he would like to switch back from valsartan-hctz to valsartan 160 mg (#2 tablets daily). Per discussion with Dr. Aundra Dubin, will switch back to valsartan. Patient notified.   Ruta Hinds. Velva Harman, PharmD, BCPS, CPP Clinical Pharmacist Pager: [phone] Phone: [phone] 02/26/2017 4:46 PM

## 2017-03-07 ENCOUNTER — Other Ambulatory Visit (HOSPITAL_COMMUNITY): Payer: Self-pay | Admitting: Cardiology

## 2017-03-10 ENCOUNTER — Other Ambulatory Visit (HOSPITAL_COMMUNITY): Payer: Self-pay | Admitting: Cardiology

## 2017-03-31 ENCOUNTER — Ambulatory Visit (HOSPITAL_COMMUNITY)
Admission: RE | Admit: 2017-03-31 | Discharge: 2017-03-31 | Disposition: A | Discharge status: Home / Self Care | Payer: Medicare Other | Source: Ambulatory Visit | Attending: Cardiology | Admitting: Cardiology

## 2017-03-31 VITALS — BP 108/68 | HR 51 | Wt 176.0 lb

## 2017-03-31 DIAGNOSIS — N183 Chronic kidney disease, stage 3 (moderate): Secondary | ICD-10-CM | POA: Insufficient documentation

## 2017-03-31 DIAGNOSIS — I255 Ischemic cardiomyopathy: Secondary | ICD-10-CM | POA: Diagnosis not present

## 2017-03-31 DIAGNOSIS — I739 Peripheral vascular disease, unspecified: Secondary | ICD-10-CM

## 2017-03-31 DIAGNOSIS — I472 Ventricular tachycardia, unspecified: Secondary | ICD-10-CM

## 2017-03-31 DIAGNOSIS — I2510 Atherosclerotic heart disease of native coronary artery without angina pectoris: Secondary | ICD-10-CM | POA: Insufficient documentation

## 2017-03-31 DIAGNOSIS — M069 Rheumatoid arthritis, unspecified: Secondary | ICD-10-CM | POA: Diagnosis not present

## 2017-03-31 DIAGNOSIS — R05 Cough: Secondary | ICD-10-CM | POA: Diagnosis not present

## 2017-03-31 DIAGNOSIS — Z79899 Other long term (current) drug therapy: Secondary | ICD-10-CM | POA: Insufficient documentation

## 2017-03-31 DIAGNOSIS — Z87891 Personal history of nicotine dependence: Secondary | ICD-10-CM | POA: Insufficient documentation

## 2017-03-31 DIAGNOSIS — R946 Abnormal results of thyroid function studies: Secondary | ICD-10-CM | POA: Insufficient documentation

## 2017-03-31 DIAGNOSIS — I130 Hypertensive heart and chronic kidney disease with heart failure and stage 1 through stage 4 chronic kidney disease, or unspecified chronic kidney disease: Secondary | ICD-10-CM | POA: Diagnosis not present

## 2017-03-31 DIAGNOSIS — F419 Anxiety disorder, unspecified: Secondary | ICD-10-CM | POA: Insufficient documentation

## 2017-03-31 DIAGNOSIS — I2582 Chronic total occlusion of coronary artery: Secondary | ICD-10-CM | POA: Diagnosis not present

## 2017-03-31 DIAGNOSIS — E785 Hyperlipidemia, unspecified: Secondary | ICD-10-CM | POA: Diagnosis not present

## 2017-03-31 DIAGNOSIS — I5022 Chronic systolic (congestive) heart failure: Secondary | ICD-10-CM | POA: Insufficient documentation

## 2017-03-31 DIAGNOSIS — Z9889 Other specified postprocedural states: Secondary | ICD-10-CM | POA: Diagnosis not present

## 2017-03-31 DIAGNOSIS — Z7982 Long term (current) use of aspirin: Secondary | ICD-10-CM | POA: Insufficient documentation

## 2017-03-31 DIAGNOSIS — Z7902 Long term (current) use of antithrombotics/antiplatelets: Secondary | ICD-10-CM | POA: Insufficient documentation

## 2017-03-31 DIAGNOSIS — I4892 Unspecified atrial flutter: Secondary | ICD-10-CM | POA: Insufficient documentation

## 2017-03-31 LAB — COMPREHENSIVE METABOLIC PANEL
ALBUMIN: 3.7 g/dL (ref 3.5–5.0)
ALK PHOS: 95 U/L (ref 38–126)
ALT: 14 U/L — AB (ref 17–63)
ANION GAP: 6 (ref 5–15)
AST: 21 U/L (ref 15–41)
BUN: 18 mg/dL (ref 6–20)
CALCIUM: 8.9 mg/dL (ref 8.9–10.3)
CHLORIDE: 106 mmol/L (ref 101–111)
CO2: 26 mmol/L (ref 22–32)
CREATININE: 1.46 mg/dL — AB (ref 0.61–1.24)
GFR calc non Af Amer: 46 mL/min — ABNORMAL LOW (ref >60)
GFR, EST AFRICAN AMERICAN: 54 mL/min — AB (ref >60)
GLUCOSE: 93 mg/dL (ref 65–99)
Potassium: 4.5 mmol/L (ref 3.5–5.1)
SODIUM: 138 mmol/L (ref 135–145)
Total Bilirubin: 0.7 mg/dL (ref 0.3–1.2)
Total Protein: 6.8 g/dL (ref 6.5–8.1)

## 2017-03-31 NOTE — Progress Notes (Signed)
Patient ID: Nathan Pruitt, male   DOB: 08-05-1945, 71 y.o.   MRN: [id number]     Advanced Heart Failure Clinic Note   PCP: Dr. Helene Kelp Cardiology: Dr. Aundra Dubin  71 yo with history of CAD, PAD, ischemic CMP, and VT s/p ICD placement returns for followup of CHF.  Echo in 2/17 showed EF 40% and regional WMAs.  In 3/18, he developed episodes of VT as well as increased dyspnea.  LHC/RHC was done, showing new occlusion of a large ramus (CTO, probably a month or so old at time of cath) and old CTO RCA with collaterals.  He had DES x 3 to ramus.  Filling pressures were optimized on RHC.  Amiodarone was increased to 200 mg daily from 100 mg daily.  No VT since PCI.   At last appointment, he had been off diuretics (though torsemide was causing him a cough) and was volume overloaded. He was started on Lasix 40 mg daily.  Today, he is down 13 lbs.  Breathing is at baseline.  No dyspnea walking on flat ground but gets short of breath walking up an incline or up stairs.  No orthopnea/PND. No chest pain.  No palpitations.      Medtronic device interrogation: fluid index < threshold but with some downtrend in impedance recently.  No atrial fibrillation or VT.   Labs (11/10): K 4.9, creatinine 1.4, LDL 70, HDL 31, LFTs normal Labs (3/11): K 4.4, creatinine 1.3, LDL 61, HDL 35 Labs (7/11): LDL 62, HDL 23 Labs (1/12): K 4.8, LFTs normal, creatinine 1.44, LDL 69, HDL 33 Labs (7/12): LFTs normal, LDL 81, HDL 36, HCT normal, K 4.3, creatinine 1.3, BNP 116, TSH normal Labs (1/13): LDL 39, HDl 31, LFTs normal, TSH  Labs (7/13): TSH normal, LFTs normal, LDL 69, HDL 40 Labs (8/13): K 4.5, creatinine 1.6 Labs (10/13): K 4.4, creatinine 1.4 Labs (3/14): LDL 108, HDL 30, LFTs normal Labs (9/14): K 4.2, creatinine 1.6, LFTs normal, LDL 114, TSH 10.6 (elevated), free T4/free T3 normal Labs (2/15): K 4.6, creatinine 1.5, LFTs normal, TSH 7.6 (elevated), free T4 and free T3 normal Labs (3/15): K 3.6, creatinine 1.5, LFTs  normal, TSH normal Labs (9/15): K 4.3, creatinine 1.6, LFTs normal, TSH normal, LDL 115, HDL 24 Labs (10/16): K 4.5, creatinine 1.5, LDL 90, HDL 35, hgb 12.8 Labs (11/16): Low free T3, normal TSH and free T4 Labs (12/16): K 5, creatinine 1.8, LFTs normal, TSH 8.6 (mildly elevated) Labs (1/17): Free T4/TSH/free T3 normal, LFTs normal Labs (2/17): K 4.4, creatinine 2.4, LDL 131, HDL 34 Labs (4/17): K 4.2, creatinine 1.3, LDL 67, HDL 35 Labs (9/17): K 4.9, creatinine 2.2, LDL 54, HDL 35, free T3 and T4 normal, hgb 12 Labs (4/18): K 5, creatinine 1.78, LFTs normal, TSH elevated but free T3 and free T4 normal.  Labs (6/18): LDL 52, HDL 35, TSH elevated 6.8, LFTs normal Labs (7/18): K 4.3, creatinine 1.68 Labs (10/18): TSH 5.6, free T3 normal, free T4 normal, LFTs normal, K 4.9, creatinine 1.4  ECG (personally reviewed): a-paced, IVCD 142 msec, PVC  Allergies (verified):  No Known Drug Allergies  Past Medical History: 1. Coronary artery disease. The patient reports history of silent MI in 1993.  This was likely an inferior MI (see below).  - The patient presented to [facility] in 5/10 with VT and mildly elevated cardiac enzymes. LHC (5/10):  Inferobasal dyskinesis with EF 35-40%.  There was chronic total occlusion of the mid RCA with good collaterals.  Luminals LCA.  This did not appear to be an acute cause of the 5/10 event.  - LHC (3/18): Known occlusion of RCA with collaterals, new occlusion of ramus.  DES x 3 to ramus.  2. Hypertension.  3. Hyperlipidemia: Intolerant of simvastatin, Crestor, Lipitor, pravastatin. .  4. Remote tobacco abuse with 47-pack-year history, quitting in August 2009.  5. Peripheral arterial disease.  - Status post left-to-right fem-fem bypass performed in [place] in March 2009.  - Status post redo left-to-right fem-fem bypass performed by Dr. Scot Dock at [facility] in 2009.  - ABIs normal 3/15, ABIs normal 3/16, ABIs normal 3/17, ABIs normal 4/18.  6. Rheumatoid  arthritis, on leflunomide.  7.  Ischemic cardiomyopathy:  EF 35-40% by LV-gram 5/10 with inferobasal dyskinesis.  Echo 5/10 showed EF 40% with mild LVH, no significant MR, inferobasal and posterobasal akinesis.  Echo (7/11): EF 50%, mild LVH, basal-mid inferoposterior akinesis.  Echo (7/12): EF 45-50% with basal anterolateral, basal posterior, and basal to mid inferior akinesis.  Echo (4/16): EF 40-45%, basal to mid inferolateral AK, basal inferior AK, basal to mid anterolateral HK.  Echo (2/17): EF 40% with basal to mid inferior akinesis, basal inferolateral aneurysm, mid inferolateral akinesis, basal anterolateral hypokinesis, normal RV size and systolic function, PASP 25 mmHg.  - Echo (3/18) with EF 30-35%, moderate LV dilation, moderate MR, mildly decreased RV systolic function, PASP 51 mmmHg. - RHC (3/18): mean RA 4, PA 38/12, mean PCWP 17, CI 2.2.  8.  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  He has a dual chamber Medtronic ICD.  9.  Atrial flutter:  Status post isthmus ablation 5/10.   10.  PFTs (7/11): FVC 74%, FEV1 80%, ratio 75%, TLC 78%, DLCO 68%.  This suggests a mild restrictive defect and a mild obstructive defect.  He did have response to bronchodilator. These PFTs were significantly better than the report from Dr. Alcide Clever in [place] done prior.  He had last PFTs in 2/12 with no significant change.  11.  Anxiety 12.  Chronic cough: No relief with change from ACEI to ARB or with trial of PPI.  13.  CKD: Stage 3.   Family History: Mother died in her late 69s with gastric cancer.  Father died in his late 43s with throat cancer and PVD.   He had a brother who died at 5 of an MI.   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 -47ppy hx, quit 2009.  Alcohol Use - yes-minimal Regular Exercise - yes Drug Use - no (prior)  Review of Systems        All systems reviewed  and negative except as per HPI.   Current Outpatient Medications  Medication Sig Dispense Refill  . amiodarone (PACERONE) 200 MG tablet Take 1 tablet (200 mg total) by mouth daily. 90 tablet 3  . aspirin EC 81 MG tablet Take 1 tablet (81 mg total) by mouth daily.    . carvedilol (COREG) 25 MG tablet Take 1 tablet (25 mg total) by mouth 2 (two) times daily with a meal. 180 tablet 3  . chlorhexidine (PERIDEX) 0.12 % solution Swish and rinse as needed for dental  3  . clopidogrel (PLAVIX) 75 MG tablet Take 1 tablet (75 mg total) by mouth daily with breakfast. Please prescribe generic 90 tablet 3  . ezetimibe (ZETIA) 10 MG tablet TAKE 1 TABLET (10 MG TOTAL) BY MOUTH DAILY. [place]  tablet 2  . fluticasone (FLONASE) 50 MCG/ACT nasal spray Place 1-2 sprays into both nostrils daily as needed for allergies or rhinitis.    . furosemide (LASIX) 40 MG tablet Take 1 tablet (40 mg total) by mouth daily. 90 tablet 3  . leflunomide (ARAVA) 20 MG tablet Take 20 mg by mouth daily.      . Melatonin 10 MG TABS Take 10 mg by mouth at bedtime as needed (sleep).     . simvastatin (ZOCOR) 20 MG tablet TAKE 1 TABLET (20 MG TOTAL) BY MOUTH AT BEDTIME. 90 tablet 3  . valsartan (DIOVAN) 160 MG tablet Take 2 tablets (320 mg total) by mouth daily. 60 tablet 11   No current facility-administered medications for this encounter.     Vitals:   03/31/17 0933  BP: 108/68  Pulse: (!) 51  SpO2: 96%  Weight: 176 lb (79.8 kg)   Wt Readings from Last 3 Encounters:  03/31/17 176 lb (79.8 kg)  02/14/17 189 lb (85.7 kg)  02/14/17 185 lb (83.9 kg)     General: NAD Neck: No JVD, no thyromegaly or thyroid nodule.  Lungs: Clear to auscultation bilaterally with normal respiratory effort. CV: Nondisplaced PMI.  Heart regular S1/S2, no S3/S4, no murmur.  No peripheral edema.  No carotid bruit.  Normal pedal pulses.  Abdomen: Soft, nontender, no hepatosplenomegaly, no distention.  Skin: Intact without lesions or rashes.  Neurologic:  Alert and oriented x 3.  Psych: Normal affect. Extremities: No clubbing or cyanosis.  HEENT: Normal.   Assessment/Plan:  1. Chronic systolic CHF Ischemic cardiomyopathy.  Medtronic ICD.  Echo (3/18) with EF 30-35%. NYHA class II-III symptoms.  Weight is down, volume looks better by exam and by Optivol from device.   - Continue Coreg 25 mg bid and valsartan 320 mg daily. - We have discussed transitioning to [facility], he wants to stay on valsartan (says Delene Loll would send him into donut hole too fast).     - He is off spironolactone (had side effects, does not remember what).  We discussed eplerenone today. He wants to read about it and will let me know if he is willing to try it.  - Continue Lasix 40 mg daily. BMET today.  2. CAD Denies chest pain.  Had DES x 3 to ramus in 3/18.   - Continue ASA 81 mg daily, clopidogrel, Zocor 20 mg daily, Zetia 10 mg daily, and Coreg/ARB as above.   3. HLD  Stable on Zetia and simvastatin as above without myalgias. Good lipids in 6/18.   4. Hx of VT.   Continue amiodarone 200 mg daily (increased from 100 mg daily after last VT event). Has ICD.  - TSH mildly elevated in 10/18 (similar to the past) but free T3 and T4 normal in past. Check LFTs today.  - Knows to get a yearly eye exam 5. PAD He denies claudication.  Normal ABIs in 4/18, followed by [facility].    Loralie Champagne 03/31/2017

## 2017-03-31 NOTE — Patient Instructions (Signed)
Labs today (will call for abnormal results, otherwise no news is good news)  Follow up in 4 months 

## 2017-04-09 ENCOUNTER — Other Ambulatory Visit: Payer: Self-pay | Admitting: Internal Medicine

## 2017-04-21 DIAGNOSIS — L570 Actinic keratosis: Secondary | ICD-10-CM | POA: Diagnosis not present

## 2017-04-21 DIAGNOSIS — C4441 Basal cell carcinoma of skin of scalp and neck: Secondary | ICD-10-CM | POA: Diagnosis not present

## 2017-04-21 DIAGNOSIS — C4442 Squamous cell carcinoma of skin of scalp and neck: Secondary | ICD-10-CM | POA: Diagnosis not present

## 2017-04-21 DIAGNOSIS — C44629 Squamous cell carcinoma of skin of left upper limb, including shoulder: Secondary | ICD-10-CM | POA: Diagnosis not present

## 2017-05-01 DIAGNOSIS — C4442 Squamous cell carcinoma of skin of scalp and neck: Secondary | ICD-10-CM | POA: Diagnosis not present

## 2017-05-05 DIAGNOSIS — E785 Hyperlipidemia, unspecified: Secondary | ICD-10-CM | POA: Diagnosis not present

## 2017-05-05 DIAGNOSIS — I10 Essential (primary) hypertension: Secondary | ICD-10-CM | POA: Diagnosis not present

## 2017-05-05 DIAGNOSIS — I739 Peripheral vascular disease, unspecified: Secondary | ICD-10-CM | POA: Diagnosis not present

## 2017-05-05 DIAGNOSIS — I2510 Atherosclerotic heart disease of native coronary artery without angina pectoris: Secondary | ICD-10-CM | POA: Diagnosis not present

## 2017-05-05 DIAGNOSIS — Z79899 Other long term (current) drug therapy: Secondary | ICD-10-CM | POA: Diagnosis not present

## 2017-05-05 DIAGNOSIS — Z0000 Encounter for general adult medical examination without abnormal findings: Secondary | ICD-10-CM | POA: Diagnosis not present

## 2017-05-13 ENCOUNTER — Telehealth (HOSPITAL_COMMUNITY): Payer: Self-pay | Admitting: Pharmacist

## 2017-05-13 MED ORDER — VALSARTAN 320 MG PO TABS: 160.0000 mg | ORAL_TABLET | Freq: Two times a day (BID) | ORAL | 11 refills | Status: DC

## 2017-05-13 NOTE — Telephone Encounter (Signed)
Spoke with Mr. Fendley who stated that his valsartan 160 mg BID copay would be $57 per month but olmesartan would be $12 per 90 day supply. Per discussion with Dr. Aundra Dubin, would like Mr. Horsman to stay on one of the ARB's that has evidence for its use in HF including valsartan, candesartan and losartan. He cannot tolerate losartan and candesartan would be the same copay for him. He is agreeable to staying on the valsartan but has found that it would be cheaper for him to take valsartan 320 mg (1/2 tab BID) for $13/mo. Will send Rx for this dose per his request. If he ever wanted to take the 160 mg BID dose, it has been approved by [facility] through 05/13/18.   Ruta Hinds. Velva Harman, PharmD, BCPS, CPP Clinical Pharmacist Phone: [phone] 05/13/2017 2:32 PM

## 2017-05-19 ENCOUNTER — Ambulatory Visit (INDEPENDENT_AMBULATORY_CARE_PROVIDER_SITE_OTHER): Payer: Medicare Other | Admitting: *Deleted

## 2017-05-19 DIAGNOSIS — I472 Ventricular tachycardia: Secondary | ICD-10-CM

## 2017-05-22 ENCOUNTER — Encounter: Payer: Self-pay | Admitting: Cardiology

## 2017-05-23 ENCOUNTER — Encounter: Payer: Self-pay | Admitting: Cardiology

## 2017-05-23 NOTE — Progress Notes (Signed)
Remote ICD transmission.   

## 2017-05-25 LAB — CUP PACEART REMOTE DEVICE CHECK
Brady Statistic AP VP Percent: 3.5 %
Brady Statistic AP VS Percent: 46.76 %
Brady Statistic AS VP Percent: 0.82 %
Brady Statistic RA Percent Paced: 44.06 %
Brady Statistic RV Percent Paced: 3.93 %
Date Time Interrogation Session: 20190118100715
HIGH POWER IMPEDANCE MEASURED VALUE: 37 Ohm
HIGH POWER IMPEDANCE MEASURED VALUE: 50 Ohm
Implantable Lead Implant Date: 20100520
Implantable Lead Location: 753859
Implantable Lead Location: 753860
Implantable Lead Model: 5076
Lead Channel Impedance Value: 266 Ohm
Lead Channel Impedance Value: 342 Ohm
Lead Channel Pacing Threshold Amplitude: 0.625 V
Lead Channel Pacing Threshold Amplitude: 1.25 V
Lead Channel Pacing Threshold Pulse Width: 0.4 ms
Lead Channel Sensing Intrinsic Amplitude: 5.625 mV
Lead Channel Setting Pacing Amplitude: 2.5 V
Lead Channel Setting Sensing Sensitivity: 0.3 mV
MDC IDC LEAD IMPLANT DT: 20100520
MDC IDC MSMT BATTERY REMAINING LONGEVITY: 123 mo
MDC IDC MSMT BATTERY VOLTAGE: 3.06 V
MDC IDC MSMT LEADCHNL RA PACING THRESHOLD PULSEWIDTH: 0.4 ms
MDC IDC MSMT LEADCHNL RA SENSING INTR AMPL: 1.5 mV
MDC IDC MSMT LEADCHNL RA SENSING INTR AMPL: 1.5 mV
MDC IDC MSMT LEADCHNL RV IMPEDANCE VALUE: 323 Ohm
MDC IDC MSMT LEADCHNL RV SENSING INTR AMPL: 5.625 mV
MDC IDC PG IMPLANT DT: 20180710
MDC IDC SET LEADCHNL RA PACING AMPLITUDE: 1.5 V
MDC IDC SET LEADCHNL RV PACING PULSEWIDTH: 0.4 ms
MDC IDC STAT BRADY AS VS PERCENT: 48.91 %

## 2017-05-30 ENCOUNTER — Other Ambulatory Visit: Payer: Self-pay | Admitting: Internal Medicine

## 2017-05-30 ENCOUNTER — Other Ambulatory Visit: Payer: Self-pay | Admitting: Physician Assistant

## 2017-05-30 NOTE — Telephone Encounter (Signed)
This is a CHF pt 

## 2017-06-03 ENCOUNTER — Other Ambulatory Visit (HOSPITAL_COMMUNITY): Payer: Self-pay | Admitting: Pharmacist

## 2017-06-03 MED ORDER — CLOPIDOGREL BISULFATE 75 MG PO TABS: 75.0000 mg | ORAL_TABLET | Freq: Every day | ORAL | 3 refills | Status: DC

## 2017-07-14 DIAGNOSIS — C4442 Squamous cell carcinoma of skin of scalp and neck: Secondary | ICD-10-CM | POA: Diagnosis not present

## 2017-07-14 DIAGNOSIS — L570 Actinic keratosis: Secondary | ICD-10-CM | POA: Diagnosis not present

## 2017-07-25 ENCOUNTER — Encounter: Payer: Self-pay | Admitting: Nurse Practitioner

## 2017-07-29 NOTE — Progress Notes (Signed)
Electrophysiology Office Note Date: 08/06/2017  ID:  Nathan Pruitt, DOB 06/23/1945, MRN [id number]  PCP: Ronita Hipps, MD Primary Cardiologist: Aundra Dubin Electrophysiologist: Allred  CC: Routine ICD follow-up  Nathan Pruitt is a 72 y.o. male seen today for Dr Rayann Heman.  He presents today for routine electrophysiology followup.  Since last being seen in our clinic, the patient reports doing relatively well.  He continues to be followed closely in AHF clinic.  He denies chest pain, palpitations, dyspnea, PND, orthopnea, nausea, vomiting, dizziness, syncope, edema, weight gain, or early satiety.  He has not had ICD shocks.   Device History: MDT dual chamber ICD implanted 2010 for VT; gen change 2018 History of appropriate therapy: Yes History of AAD therapy: Yes - amiodarone   Past Medical History:  Diagnosis Date  . Abnormal PFTs 7/11   FVC 74%, FEV1 80%, ratio 75%, TLC 78%, DLCO 68%. this suggests a mild restrictive and obstructive defect. pt did have a response to bronchodilator. these PFTs were significantly better than the report from Dr. Alcide Clever in [place] done prior.   [name] AICD (automatic cardioverter/defibrillator) present   . Anxiety   . Atrial flutter ([place])    s/p isthmus ablation 5/10  . CAD (coronary artery disease)    hx of silent MI in 1993. likely an inferior MI. hx of 2D cardiogram in 3/09 showing EF of 40%. hx of myoview in [place] 3/09-nml. presented to [facility] 5/10 with VT and mildly elevated cardiac enzymes LHC (5/10): inferobasal dyskinesis with EF 35-40%. was chronic total occlusion of mid RCA with good collaterals. luminals LCA. this does not appear to be an acute cause of the 5/10 event  . Cancer ([place])    skin  . CHF (congestive heart failure) ([place])   . Chronic lower back pain   . HLD (hyperlipidemia)   . HTN (hypertension)   . Ischemic cardiomyopathy    EF 35-40% by LV-gram 5/10 with inferobasal dyskinesis. echo 5/10 showed EF 40% w/mild LVH, no sig. MR, inferobasal  and posterobasal akinesis. echo (7/11): EF 50%, mild LVH, basal-mid inferoposterior akenesis  . Migraine    "visual migraine; maybe 12/year" (07/16/2016)  . PAD (peripheral artery disease) (HCC)    s/p L-to-R fem-fem bypass performed by Dr. Scot Dock at [facility] in 2009   . Rheumatoid arthritis (HCC)    on leflunomide  . Silent myocardial infarction [facility]) 1993   "silent"  . Tobacco abuse    47 pack year hx; quit 8/09  . Ventricular tachycardia ([place])    likely scar-mediated. 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 He has duel chamber Medtronic ICD   Past Surgical History:  Procedure Laterality Date  . CARDIAC DEFIBRILLATOR PLACEMENT  09/22/2008  . CATARACT EXTRACTION W/ INTRAOCULAR LENS  IMPLANT, BILATERAL Bilateral 05/2016 - 06/2016  . CORONARY CTO INTERVENTION  07/16/2016  . CORONARY CTO INTERVENTION N/A 07/16/2016   Procedure: Coronary CTO Intervention;  Surgeon: Belva Crome, MD;  Location: [facility];  Service: Cardiovascular;  Laterality: N/A;  . EP IMPLANTABLE DEVICE  09/22/08   Medtronic, ICD Model Number:  [id number], ICD Serial Number: [id number]  . FEMORAL ARTERY - FEMORAL ARTERY BYPASS GRAFT  march 2009   left to right bypass, first @ [facility], second at [facility] by Dr Scot Dock  . ICD GENERATOR CHANGEOUT N/A 11/12/2016   Procedure: ICD Generator Changeout;  Surgeon: Thompson Grayer, MD;  Location: [place] [facility];  Service: Cardiovascular;  Laterality: N/A;  .  RIGHT/LEFT HEART CATH AND CORONARY ANGIOGRAPHY N/A 07/12/2016   Procedure: Right/Left Heart Cath and Coronary Angiography;  Surgeon: Larey Dresser, MD;  Location: [facility];  Service: Cardiovascular;  Laterality: N/A;  . TONSILLECTOMY    . VASECTOMY    . VENTRICULAR ABLATION SURGERY  09/2008    Current Outpatient Medications  Medication Sig Dispense Refill  . amiodarone (PACERONE) 200 MG tablet Take 1 tablet (200 mg total) by mouth daily. 90 tablet 3  . aspirin EC 81 MG  tablet Take 1 tablet (81 mg total) by mouth daily.    . carvedilol (COREG) 25 MG tablet TAKE 1 TABLET (25 MG TOTAL) BY MOUTH 2 (TWO) TIMES DAILY WITH A MEAL. 180 tablet 2  . chlorhexidine (PERIDEX) 0.12 % solution Swish and rinse as needed for dental  3  . clopidogrel (PLAVIX) 75 MG tablet Take 1 tablet (75 mg total) by mouth daily with breakfast. Please prescribe generic 90 tablet 3  . eplerenone (INSPRA) 25 MG tablet Take 1 tablet (25 mg total) by mouth daily. 30 tablet 3  . ezetimibe (ZETIA) 10 MG tablet TAKE 1 TABLET (10 MG TOTAL) BY MOUTH DAILY. 90 tablet 2  . fluticasone (FLONASE) 50 MCG/ACT nasal spray Place 1-2 sprays into both nostrils daily as needed for allergies or rhinitis.    . furosemide (LASIX) 40 MG tablet Take 1 tablet (40 mg total) by mouth daily. 90 tablet 3  . leflunomide (ARAVA) 20 MG tablet Take 20 mg by mouth daily.      . Melatonin 10 MG TABS Take 10 mg by mouth at bedtime as needed (sleep).     . simvastatin (ZOCOR) 20 MG tablet TAKE 1 TABLET (20 MG TOTAL) BY MOUTH AT BEDTIME. 90 tablet 3  . valsartan (DIOVAN) 320 MG tablet Take 0.5 tablets (160 mg total) by mouth 2 (two) times daily. 30 tablet 11   No current facility-administered medications for this visit.     Allergies:   Atorvastatin; Crestor [rosuvastatin calcium]; Losartan; Telmisartan; Enalapril; and Lisinopril   Social History: Social History   Socioeconomic History  . Marital status: Single    Spouse name: Not on file  . Number of children: Not on file  . Years of education: Not on file  . Highest education level: Not on file  Occupational History  . Not on file  Social Needs  . Financial resource strain: Not on file  . Food insecurity:    Worry: Not on file    Inability: Not on file  . Transportation needs:    Medical: Not on file    Non-medical: Not on file  Tobacco Use  . Smoking status: Former Smoker    Packs/day: 1.00    Years: 47.00    Pack years: 47.00    Last attempt to quit: 2009      Years since quitting: 10.2  . Smokeless tobacco: Never Used  Substance and Sexual Activity  . Alcohol use: Yes    Alcohol/week: 0.0 oz    Comment: 07/15/2016 "nothing for the last couple years"  . Drug use: No    Comment: prior   . Sexual activity: Not on file  Lifestyle  . Physical activity:    Days per week: Not on file    Minutes per session: Not on file  . Stress: Not on file  Relationships  . Social connections:    Talks on phone: Not on file    Gets together: Not on file  Attends religious service: Not on file    Active member of club or organization: Not on file    Attends meetings of clubs or organizations: Not on file    Relationship status: Not on file  . Intimate partner violence:    Fear of current or ex partner: Not on file    Emotionally abused: Not on file    Physically abused: Not on file    Forced sexual activity: Not on file  Other Topics Concern  . 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 Gastric cancer Mother   . Throat cancer Father   . Peripheral vascular disease Father   . Heart attack Brother 84    Review of Systems: All other systems reviewed and are otherwise negative except as noted above.   Physical Exam: VS:  BP 116/66   Pulse 94   Ht 5\' 10"  (1.778 m)   Wt 184 lb (83.5 kg)   BMI 26.40 kg/m  , BMI Body mass index is 26.4 kg/m.  GEN- The patient is well appearing, alert and oriented x 3 today.   HEENT: normocephalic, atraumatic; sclera clear, conjunctiva pink; hearing intact; oropharynx clear; neck supple  Lungs- Clear to ausculation bilaterally, normal work of breathing.  No wheezes, rales, rhonchi Heart- Regular rate and rhythm  GI- soft, non-tender, non-distended, bowel sounds present  Extremities- no clubbing, cyanosis, or edema  MS- no significant deformity or atrophy Skin-  warm and dry, no rash or lesion; ICD pocket well healed Psych- euthymic mood, full affect Neuro- strength and sensation are intact  ICD interrogation- reviewed in detail today,  See PACEART report  EKG:  EKG is not ordered today.  Recent Labs: 08/14/2016: Magnesium 2.2 11/12/2016: Hemoglobin 11.3; Platelets 177 07/30/2017: ALT 17; BUN 20; Creatinine, Ser 1.50; Potassium 4.0; Sodium 139; TSH 5.117   Wt Readings from Last 3 Encounters:  08/06/17 184 lb (83.5 kg)  07/30/17 182 lb (82.6 kg)  03/31/17 176 lb (79.8 kg)     Other studies Reviewed: Additional studies/ records that were reviewed today include: Dr Aundra Dubin and Dr Jackalyn Lombard office notes   Assessment and Plan:  1.  Chronic systolic dysfunction euvolemic today Stable on an appropriate medical regimen Normal ICD function See Pace Art report No changes today  2.  VT No recent recurrence  Continue amiodarone Labs reviewed, will defer PFT's to Dr Aundra Dubin   3.  CAD No recent ischemic symptoms Continue current therapy   4.  HTN Stable No change required today   Current medicines are reviewed at length with the patient today.   The patient does not have concerns regarding his medicines.  The following changes were made today:  none  Labs/ tests ordered today include: none No orders of the defined types were placed in this encounter.    Disposition:   Follow up with [facility], Dr Rayann Heman 6 months    Signed, Chanetta Marshall, NP 08/06/2017 8:15 AM  [facility] [address] [place]  [phone] (office) [phone] (fax)

## 2017-07-30 ENCOUNTER — Ambulatory Visit (HOSPITAL_COMMUNITY)
Admission: RE | Admit: 2017-07-30 | Discharge: 2017-07-30 | Disposition: A | Discharge status: Home / Self Care | Payer: Medicare Other | Source: Ambulatory Visit | Attending: Cardiology | Admitting: Cardiology

## 2017-07-30 ENCOUNTER — Encounter (HOSPITAL_COMMUNITY): Payer: Self-pay | Admitting: Cardiology

## 2017-07-30 VITALS — BP 105/60 | HR 53 | Ht 70.0 in | Wt 182.0 lb

## 2017-07-30 DIAGNOSIS — I4892 Unspecified atrial flutter: Secondary | ICD-10-CM | POA: Diagnosis not present

## 2017-07-30 DIAGNOSIS — Z7982 Long term (current) use of aspirin: Secondary | ICD-10-CM | POA: Insufficient documentation

## 2017-07-30 DIAGNOSIS — I5022 Chronic systolic (congestive) heart failure: Secondary | ICD-10-CM | POA: Diagnosis not present

## 2017-07-30 DIAGNOSIS — Z79899 Other long term (current) drug therapy: Secondary | ICD-10-CM | POA: Insufficient documentation

## 2017-07-30 DIAGNOSIS — Z7902 Long term (current) use of antithrombotics/antiplatelets: Secondary | ICD-10-CM | POA: Diagnosis not present

## 2017-07-30 DIAGNOSIS — I255 Ischemic cardiomyopathy: Secondary | ICD-10-CM | POA: Diagnosis not present

## 2017-07-30 DIAGNOSIS — E785 Hyperlipidemia, unspecified: Secondary | ICD-10-CM | POA: Diagnosis not present

## 2017-07-30 DIAGNOSIS — F419 Anxiety disorder, unspecified: Secondary | ICD-10-CM | POA: Insufficient documentation

## 2017-07-30 DIAGNOSIS — N183 Chronic kidney disease, stage 3 (moderate): Secondary | ICD-10-CM | POA: Insufficient documentation

## 2017-07-30 DIAGNOSIS — I472 Ventricular tachycardia, unspecified: Secondary | ICD-10-CM

## 2017-07-30 DIAGNOSIS — Z9889 Other specified postprocedural states: Secondary | ICD-10-CM | POA: Diagnosis not present

## 2017-07-30 DIAGNOSIS — I739 Peripheral vascular disease, unspecified: Secondary | ICD-10-CM | POA: Diagnosis not present

## 2017-07-30 DIAGNOSIS — I130 Hypertensive heart and chronic kidney disease with heart failure and stage 1 through stage 4 chronic kidney disease, or unspecified chronic kidney disease: Secondary | ICD-10-CM | POA: Diagnosis not present

## 2017-07-30 DIAGNOSIS — M069 Rheumatoid arthritis, unspecified: Secondary | ICD-10-CM | POA: Insufficient documentation

## 2017-07-30 DIAGNOSIS — I2510 Atherosclerotic heart disease of native coronary artery without angina pectoris: Secondary | ICD-10-CM | POA: Insufficient documentation

## 2017-07-30 DIAGNOSIS — R7989 Other specified abnormal findings of blood chemistry: Secondary | ICD-10-CM | POA: Diagnosis not present

## 2017-07-30 DIAGNOSIS — Z87891 Personal history of nicotine dependence: Secondary | ICD-10-CM | POA: Diagnosis not present

## 2017-07-30 DIAGNOSIS — I2582 Chronic total occlusion of coronary artery: Secondary | ICD-10-CM | POA: Insufficient documentation

## 2017-07-30 DIAGNOSIS — Z95810 Presence of automatic (implantable) cardiac defibrillator: Secondary | ICD-10-CM | POA: Diagnosis not present

## 2017-07-30 DIAGNOSIS — Z7951 Long term (current) use of inhaled steroids: Secondary | ICD-10-CM | POA: Insufficient documentation

## 2017-07-30 DIAGNOSIS — I252 Old myocardial infarction: Secondary | ICD-10-CM | POA: Insufficient documentation

## 2017-07-30 LAB — LIPID PANEL
CHOL/HDL RATIO: 3.4 ratio
CHOLESTEROL: 110 mg/dL (ref 0–200)
HDL: 32 mg/dL — AB (ref >40)
LDL Cholesterol: 62 mg/dL (ref 0–99)
Triglycerides: 78 mg/dL (ref <150)
VLDL: 16 mg/dL (ref 0–40)

## 2017-07-30 LAB — COMPREHENSIVE METABOLIC PANEL
ALT: 17 U/L (ref 17–63)
ANION GAP: 10 (ref 5–15)
AST: 20 U/L (ref 15–41)
Albumin: 3.7 g/dL (ref 3.5–5.0)
Alkaline Phosphatase: 92 U/L (ref 38–126)
BUN: 20 mg/dL (ref 6–20)
CHLORIDE: 105 mmol/L (ref 101–111)
CO2: 24 mmol/L (ref 22–32)
Calcium: 8.8 mg/dL — ABNORMAL LOW (ref 8.9–10.3)
Creatinine, Ser: 1.5 mg/dL — ABNORMAL HIGH (ref 0.61–1.24)
GFR calc non Af Amer: 45 mL/min — ABNORMAL LOW (ref >60)
GFR, EST AFRICAN AMERICAN: 52 mL/min — AB (ref >60)
Glucose, Bld: 136 mg/dL — ABNORMAL HIGH (ref 65–99)
Potassium: 4 mmol/L (ref 3.5–5.1)
SODIUM: 139 mmol/L (ref 135–145)
Total Bilirubin: 0.6 mg/dL (ref 0.3–1.2)
Total Protein: 6.3 g/dL — ABNORMAL LOW (ref 6.5–8.1)

## 2017-07-30 LAB — T4, FREE: FREE T4: 0.92 ng/dL (ref 0.61–1.12)

## 2017-07-30 LAB — TSH: TSH: 5.117 u[IU]/mL — ABNORMAL HIGH (ref 0.350–4.500)

## 2017-07-30 MED ORDER — EPLERENONE 25 MG PO TABS: 25.0000 mg | ORAL_TABLET | Freq: Every day | ORAL | 3 refills | Status: DC

## 2017-07-30 NOTE — Progress Notes (Signed)
Patient ID: Nathan Pruitt, male   DOB: 06/26/1945, 72 y.o.   MRN: [id number]     Advanced Heart Failure Clinic Note   PCP: Dr. Helene Kelp Cardiology: Dr. Aundra Dubin  72 yo with history of CAD, PAD, ischemic CMP, and VT s/p ICD placement returns for followup of CHF.  Echo in 2/17 showed EF 40% and regional WMAs.  In 3/18, he developed episodes of VT as well as increased dyspnea.  LHC/RHC was done, showing new occlusion of a large ramus (CTO, probably a month or so old at time of cath) and old CTO RCA with collaterals.  He had DES x 3 to ramus.  Filling pressures were optimized on RHC.  Amiodarone was increased to 200 mg daily from 100 mg daily.  No VT since PCI.   He is stable clinically.  No dyspnea on flat ground, short of breath walking up a hill.  No chest pain.  No orthopnea/PND.  No palpitations.  He has been eating more and not really watching his diet.  Weight is up 6 lbs.      Medtronic device interrogation: fluid index < threshold with stable thoracic impedance.  No atrial fibrillation or VT.   Labs (11/10): K 4.9, creatinine 1.4, LDL 70, HDL 31, LFTs normal Labs (3/11): K 4.4, creatinine 1.3, LDL 61, HDL 35 Labs (7/11): LDL 62, HDL 23 Labs (1/12): K 4.8, LFTs normal, creatinine 1.44, LDL 69, HDL 33 Labs (7/12): LFTs normal, LDL 81, HDL 36, HCT normal, K 4.3, creatinine 1.3, BNP 116, TSH normal Labs (1/13): LDL 39, HDl 31, LFTs normal, TSH  Labs (7/13): TSH normal, LFTs normal, LDL 69, HDL 40 Labs (8/13): K 4.5, creatinine 1.6 Labs (10/13): K 4.4, creatinine 1.4 Labs (3/14): LDL 108, HDL 30, LFTs normal Labs (9/14): K 4.2, creatinine 1.6, LFTs normal, LDL 114, TSH 10.6 (elevated), free T4/free T3 normal Labs (2/15): K 4.6, creatinine 1.5, LFTs normal, TSH 7.6 (elevated), free T4 and free T3 normal Labs (3/15): K 3.6, creatinine 1.5, LFTs normal, TSH normal Labs (9/15): K 4.3, creatinine 1.6, LFTs normal, TSH normal, LDL 115, HDL 24 Labs (10/16): K 4.5, creatinine 1.5, LDL 90, HDL 35,  hgb 12.8 Labs (11/16): Low free T3, normal TSH and free T4 Labs (12/16): K 5, creatinine 1.8, LFTs normal, TSH 8.6 (mildly elevated) Labs (1/17): Free T4/TSH/free T3 normal, LFTs normal Labs (2/17): K 4.4, creatinine 2.4, LDL 131, HDL 34 Labs (4/17): K 4.2, creatinine 1.3, LDL 67, HDL 35 Labs (9/17): K 4.9, creatinine 2.2, LDL 54, HDL 35, free T3 and T4 normal, hgb 12 Labs (4/18): K 5, creatinine 1.78, LFTs normal, TSH elevated but free T3 and free T4 normal.  Labs (6/18): LDL 52, HDL 35, TSH elevated 6.8, LFTs normal Labs (7/18): K 4.3, creatinine 1.68 Labs (10/18): TSH 5.6, free T3 normal, free T4 normal, LFTs normal, K 4.9, creatinine 1.4 Labs (11/18): K 4.5, creatinine 1.46, LFTs normal   Allergies (verified):  No Known Drug Allergies  Past Medical History: 1. Coronary artery disease. The patient reports history of silent MI in 1993.  This was likely an inferior MI (see below).  - The patient presented to [facility] in [facility]/10 with VT and mildly elevated cardiac enzymes. LHC (5/10):  Inferobasal dyskinesis with EF 35-40%.  There was chronic total occlusion of the mid RCA with good collaterals.  Luminals LCA.  This did not appear to be an acute cause of the 5/10 event.  - LHC (3/18): Known occlusion of RCA with  collaterals, new occlusion of ramus.  DES x 3 to ramus.  2. Hypertension.  3. Hyperlipidemia: Intolerant of simvastatin, Crestor, Lipitor, pravastatin. .  4. Remote tobacco abuse with 47-pack-year history, quitting in August 2009.  5. Peripheral arterial disease.  - Status post left-to-right fem-fem bypass performed in [place] in March 2009.  - Status post redo left-to-right fem-fem bypass performed by Dr. Scot Dock at [facility] in 2009.  - ABIs normal 3/15, ABIs normal 3/16, ABIs normal 3/17, ABIs normal 4/18.  6. Rheumatoid arthritis, on leflunomide.  7.  Ischemic cardiomyopathy:  EF 35-40% by LV-gram 5/10 with inferobasal dyskinesis.  Echo 5/10 showed EF 40% with mild LVH, no  significant MR, inferobasal and posterobasal akinesis.  Echo (7/11): EF 50%, mild LVH, basal-mid inferoposterior akinesis.  Echo (7/12): EF 45-50% with basal anterolateral, basal posterior, and basal to mid inferior akinesis.  Echo (4/16): EF 40-45%, basal to mid inferolateral AK, basal inferior AK, basal to mid anterolateral HK.  Echo (2/17): EF 40% with basal to mid inferior akinesis, basal inferolateral aneurysm, mid inferolateral akinesis, basal anterolateral hypokinesis, normal RV size and systolic function, PASP 25 mmHg.  - Echo (3/18) with EF 30-35%, moderate LV dilation, moderate MR, mildly decreased RV systolic function, PASP 51 mmmHg. - RHC (3/18): mean RA 4, PA 38/12, mean PCWP 17, CI 2.2.  8.  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  He has a dual chamber Medtronic ICD.  9.  Atrial flutter:  Status post isthmus ablation 5/10.   10.  PFTs (7/11): FVC 74%, FEV1 80%, ratio 75%, TLC 78%, DLCO 68%.  This suggests a mild restrictive defect and a mild obstructive defect.  He did have response to bronchodilator. These PFTs were significantly better than the report from Dr. Alcide Clever in [place] done prior.  He had last PFTs in 2/12 with no significant change.  11.  Anxiety 12.  Chronic cough: No relief with change from ACEI to ARB or with trial of PPI.  13.  CKD: Stage 3.   Family History: Mother died in her late 53s with gastric cancer.  Father died in his late 9s with throat cancer and PVD.   He had a brother who died at 72 of an MI.   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 -47ppy hx, quit 2009.  Alcohol Use - yes-minimal Regular Exercise - yes Drug Use - no (prior)  Review of Systems        All systems reviewed and negative except as per HPI.   Current Outpatient Medications  Medication Sig Dispense Refill  . amiodarone (PACERONE) 200 MG tablet Take 1 tablet  (200 mg total) by mouth daily. 90 tablet 3  . aspirin EC 81 MG tablet Take 1 tablet (81 mg total) by mouth daily.    . carvedilol (COREG) 25 MG tablet TAKE 1 TABLET (25 MG TOTAL) BY MOUTH 2 (TWO) TIMES DAILY WITH A MEAL. 180 tablet 2  . chlorhexidine (PERIDEX) 0.12 % solution Swish and rinse as needed for dental  3  . clopidogrel (PLAVIX) 75 MG tablet Take 1 tablet (75 mg total) by mouth daily with breakfast. Please prescribe generic 90 tablet 3  . ezetimibe (ZETIA) 10 MG tablet TAKE 1 TABLET (10 MG TOTAL) BY MOUTH DAILY. 90 tablet 2  . fluticasone (FLONASE) 50 MCG/ACT nasal spray Place 1-2 sprays into both nostrils daily as needed for allergies or rhinitis.    [name]  furosemide (LASIX) 40 MG tablet Take 1 tablet (40 mg total) by mouth daily. 90 tablet 3  . leflunomide (ARAVA) 20 MG tablet Take 20 mg by mouth daily.      . Melatonin 10 MG TABS Take 10 mg by mouth at bedtime as needed (sleep).     . simvastatin (ZOCOR) 20 MG tablet TAKE 1 TABLET (20 MG TOTAL) BY MOUTH AT BEDTIME. 90 tablet 3  . valsartan (DIOVAN) 320 MG tablet Take 0.5 tablets (160 mg total) by mouth 2 (two) times daily. 30 tablet 11  . eplerenone (INSPRA) 25 MG tablet Take 1 tablet (25 mg total) by mouth daily. 30 tablet 3   No current facility-administered medications for this encounter.     Vitals:   07/30/17 0824  BP: 105/60  Pulse: (!) 53  SpO2: 100%  Weight: 182 lb (82.6 kg)  Height: 5\' 10"  (1.778 m)   Wt Readings from Last 3 Encounters:  07/30/17 182 lb (82.6 kg)  03/31/17 176 lb (79.8 kg)  02/14/17 189 lb (85.7 kg)    General: NAD Neck: No JVD, no thyromegaly or thyroid nodule.  Lungs: Clear to auscultation bilaterally with normal respiratory effort. CV: Nondisplaced PMI.  Heart regular S1/S2, no S3/S4, no murmur.  No peripheral edema.  No carotid bruit.  Normal pedal pulses.  Abdomen: Soft, nontender, no hepatosplenomegaly, no distention.  Skin: Intact without lesions or rashes.  Neurologic: Alert and oriented  x 3.  Psych: Normal affect. Extremities: No clubbing or cyanosis.  HEENT: Normal.   Assessment/Plan:  1. Chronic systolic CHF Ischemic cardiomyopathy.  Medtronic ICD.  Echo (3/18) with EF 30-35%. NYHA class II symptoms.  Weight is up but he looks euvolemic on exam and by Optivol.  I suspect that weight gain is caloric.    - Continue Coreg 25 mg bid and valsartan 320 mg daily. - We have discussed transitioning to [facility], he wants to stay on valsartan (says Delene Loll would send him into donut hole too fast).     - He is off spironolactone (had side effects, does not remember what).  I would like him to start eplerenone 25 mg daily, will see if we can get his insurance to cover.  Will get BMET today and then 10 days after starting eplerenone.  - Continue Lasix 40 mg daily.  - Will get echo at followup appt.   2. CAD Denies chest pain.  Had DES x 3 to ramus in 3/18.   - Continue ASA 81 mg daily, clopidogrel, Zocor 20 mg daily, Zetia 10 mg daily, and Coreg/ARB as above.   3. HLD  Stable on Zetia and simvastatin as above without myalgias. Check lipids today.    4. Hx of VT.   Continue amiodarone 200 mg daily (increased from 100 mg daily after last VT event). Has ICD.  - TSH mildly elevated in 10/18 (similar to the past) but free T3 and T4 normal in past. Check TFTs and LFTs today.  - Knows to get a yearly eye exam 5. PAD He denies claudication.  Normal ABIs in 4/18, followed by [facility].    Followup in 3 months with echo.   Loralie Champagne 07/30/2017

## 2017-07-30 NOTE — Patient Instructions (Addendum)
Start Eplerenone 25 mg (1 tab) daily-Erika, Pharm D will call you  Labs drawn today (if we do not call you, then your lab work was stable)   Your physician recommends that you return for lab work in: Rx given to get labs in 10 days after starting Eplerenone   Your physician has requested that you have an echocardiogram. Echocardiography is a painless test that uses sound waves to create images of your heart. It provides your doctor with information about the size and shape of your heart and how well your heart's chambers and valves are working. This procedure takes approximately one hour. There are no restrictions for this procedure.  Your physician recommends that you schedule a follow-up appointment in: 3 months with Dr. Aundra Dubin   an a echocardiogram

## 2017-07-31 LAB — T3, FREE: T3 FREE: 2.3 pg/mL (ref 2.0–4.4)

## 2017-08-04 ENCOUNTER — Telehealth (HOSPITAL_COMMUNITY): Payer: Self-pay | Admitting: Pharmacist

## 2017-08-04 NOTE — Telephone Encounter (Addendum)
Eplerenone tier exception approved by [employer] Part D through 08/04/18. [employer] insurance agent stated that copay should be ~$10/mo. Verified with [facility] in [place] that copay will be $1/mo. Mr. Dauphinais aware and grateful for the assistance.   Ruta Hinds. Velva Harman, PharmD, BCPS, CPP Clinical Pharmacist Phone: [phone] 08/04/2017 10:36 AM

## 2017-08-05 ENCOUNTER — Encounter (HOSPITAL_COMMUNITY): Payer: Self-pay

## 2017-08-06 ENCOUNTER — Ambulatory Visit (INDEPENDENT_AMBULATORY_CARE_PROVIDER_SITE_OTHER): Payer: Medicare Other | Admitting: Nurse Practitioner

## 2017-08-06 VITALS — BP 116/66 | HR 94 | Ht 70.0 in | Wt 184.0 lb

## 2017-08-06 DIAGNOSIS — I10 Essential (primary) hypertension: Secondary | ICD-10-CM | POA: Diagnosis not present

## 2017-08-06 DIAGNOSIS — I472 Ventricular tachycardia, unspecified: Secondary | ICD-10-CM

## 2017-08-06 DIAGNOSIS — I5022 Chronic systolic (congestive) heart failure: Secondary | ICD-10-CM

## 2017-08-06 DIAGNOSIS — I255 Ischemic cardiomyopathy: Secondary | ICD-10-CM | POA: Diagnosis not present

## 2017-08-06 NOTE — Patient Instructions (Addendum)
Medication Instructions:   Your physician recommends that you continue on your current medications as directed. Please refer to the Current Medication list given to you today.  If you need a refill on your cardiac medications before your next appointment, please call your pharmacy.   Labwork:   NONE ORDERED  TODAY     Testing/Procedures: NONE ORDERED  TODAY    Follow-Up:  Your physician wants you to follow-up in: [place] will receive a reminder letter in the mail two months in advance. If you don't receive a letter, please call our office to schedule the follow-up appointment.    Remote monitoring is used to monitor your Pacemaker of ICD from home. This monitoring reduces the number of office visits required to check your device to one time per year. It allows [place] to keep an eye on the functioning of your device to ensure it is working properly. You are scheduled for a device check from home on . 08-18-17. You may send your transmission at any time that day. If you have a wireless device, the transmission will be sent automatically. After your physician reviews your transmission, you will receive a postcard with your next transmission date.      Any Other Special Instructions Will Be Listed Below (If Applicable).

## 2017-08-07 LAB — CUP PACEART INCLINIC DEVICE CHECK
Date Time Interrogation Session: 20190404091354
Implantable Lead Implant Date: 20100520
Implantable Lead Location: 753859
Implantable Lead Model: 5076
Implantable Lead Model: 6947
Implantable Pulse Generator Implant Date: 20180710
MDC IDC LEAD IMPLANT DT: 20100520
MDC IDC LEAD LOCATION: 753860

## 2017-08-11 DIAGNOSIS — R972 Elevated prostate specific antigen [PSA]: Secondary | ICD-10-CM | POA: Diagnosis not present

## 2017-08-11 DIAGNOSIS — N3020 Other chronic cystitis without hematuria: Secondary | ICD-10-CM | POA: Diagnosis not present

## 2017-08-13 ENCOUNTER — Other Ambulatory Visit: Payer: Self-pay | Admitting: Cardiology

## 2017-08-13 ENCOUNTER — Encounter (HOSPITAL_COMMUNITY): Payer: Medicare Other

## 2017-08-13 ENCOUNTER — Ambulatory Visit: Payer: Medicare Other | Admitting: Family

## 2017-08-13 ENCOUNTER — Other Ambulatory Visit (HOSPITAL_COMMUNITY): Payer: Medicare Other

## 2017-08-13 DIAGNOSIS — M0589 Other rheumatoid arthritis with rheumatoid factor of multiple sites: Secondary | ICD-10-CM | POA: Diagnosis not present

## 2017-08-13 DIAGNOSIS — I5022 Chronic systolic (congestive) heart failure: Secondary | ICD-10-CM | POA: Diagnosis not present

## 2017-08-15 ENCOUNTER — Encounter: Payer: Self-pay | Admitting: Family

## 2017-08-15 ENCOUNTER — Ambulatory Visit (INDEPENDENT_AMBULATORY_CARE_PROVIDER_SITE_OTHER)
Admission: RE | Admit: 2017-08-15 | Discharge: 2017-08-15 | Disposition: A | Discharge status: Home / Self Care | Payer: Medicare Other | Source: Ambulatory Visit | Attending: Family | Admitting: Family

## 2017-08-15 ENCOUNTER — Ambulatory Visit (INDEPENDENT_AMBULATORY_CARE_PROVIDER_SITE_OTHER): Payer: Medicare Other | Admitting: Family

## 2017-08-15 ENCOUNTER — Ambulatory Visit (HOSPITAL_COMMUNITY)
Admission: RE | Admit: 2017-08-15 | Discharge: 2017-08-15 | Disposition: A | Discharge status: Home / Self Care | Payer: Medicare Other | Source: Ambulatory Visit | Attending: Family | Admitting: Family

## 2017-08-15 ENCOUNTER — Other Ambulatory Visit: Payer: Self-pay

## 2017-08-15 VITALS — BP 151/84 | HR 50 | Temp 97.1°F | Resp 18 | Ht 71.0 in | Wt 183.0 lb

## 2017-08-15 DIAGNOSIS — Z87891 Personal history of nicotine dependence: Secondary | ICD-10-CM

## 2017-08-15 DIAGNOSIS — I779 Disorder of arteries and arterioles, unspecified: Secondary | ICD-10-CM

## 2017-08-15 DIAGNOSIS — I77811 Abdominal aortic ectasia: Secondary | ICD-10-CM

## 2017-08-15 DIAGNOSIS — R0989 Other specified symptoms and signs involving the circulatory and respiratory systems: Secondary | ICD-10-CM

## 2017-08-15 NOTE — Patient Instructions (Addendum)
Abdominal Aortic Aneurysm Blood pumps away from the heart through tubes (blood vessels) called arteries. Aneurysms are weak or damaged places in the wall of an artery. It bulges out like a balloon. An abdominal aortic aneurysm happens in the main artery of the body (aorta). It can burst or tear, causing bleeding inside the body. This is an emergency. It needs treatment right away. What are the causes? The exact cause is unknown. Things that could cause this problem include:  Fat and other substances building up in the lining of a tube.  Swelling of the walls of a blood vessel.  Certain tissue diseases.  Belly (abdominal) trauma.  An infection in the main artery of the body.  What increases the risk? There are things that make it more likely for you to have an aneurysm. These include:  Being over the age of 73 years old.  Having high blood pressure (hypertension).  Being a male.  Being white.  Being very overweight (obese).  Having a family history of aneurysm.  Using tobacco products.  What are the signs or symptoms? Symptoms depend on the size of the aneurysm and how fast it grows. There may not be symptoms. If symptoms occur, they can include:  Pain (belly, side, lower back, or groin).  Feeling full after eating a small amount of food.  Feeling sick to your stomach (nauseous), throwing up (vomiting), or both.  Feeling a lump in your belly that feels like it is beating (pulsating).  Feeling like you will pass out (faint).  How is this treated?  Medicine to control blood pressure and pain.  Imaging tests to see if the aneurysm gets bigger.  Surgery. How is this prevented? To lessen your chance of getting this condition:  Stop smoking. Stop chewing tobacco.  Limit or avoid alcohol.  Keep your blood pressure, blood sugar, and cholesterol within normal limits.  Eat less salt.  Eat foods low in saturated fats and cholesterol. These are found in animal  and whole dairy products.  Eat more fiber. Fiber is found in whole grains, vegetables, and fruits.  Keep a healthy weight.  Stay active and exercise often.  This information is not intended to replace advice given to you by your health care provider. Make sure you discuss any questions you have with your health care provider. Document Released: 08/17/2012 Document Revised: 09/28/2015 Document Reviewed: 05/22/2012 Elsevier Interactive Patient Education  2017 [employer].   Peripheral Vascular Disease Peripheral vascular disease (PVD) is a disease of the blood vessels that are not part of your heart and brain. A simple term for PVD is poor circulation. In most cases, PVD narrows the blood vessels that carry blood from your heart to the rest of your body. This can result in a decreased supply of blood to your arms, legs, and internal organs, like your stomach or kidneys. However, it most often affects a person's lower legs and feet. There are two types of PVD.  Organic PVD. This is the more common type. It is caused by damage to the structure of blood vessels.  Functional PVD. This is caused by conditions that make blood vessels contract and tighten (spasm).  Without treatment, PVD tends to get worse over time. PVD can also lead to acute ischemic limb. This is when an arm or limb suddenly has trouble getting enough blood. This is a medical emergency. Follow these instructions at home:  Take medicines only as told by your doctor.  Do not use  any tobacco products, including cigarettes, chewing tobacco, or electronic cigarettes. If you need help quitting, ask your doctor.  Lose weight if you are overweight, and maintain a healthy weight as told by your doctor.  Eat a diet that is low in fat and cholesterol. If you need help, ask your doctor.  Exercise regularly. Ask your doctor for some good activities for you.  Take good care of your feet. ? Wear comfortable shoes that fit  well. ? Check your feet often for any cuts or sores. Contact a doctor if:  You have cramps in your legs while walking.  You have leg pain when you are at rest.  You have coldness in a leg or foot.  Your skin changes.  You are unable to get or have an erection (erectile dysfunction).  You have cuts or sores on your feet that are not healing. Get help right away if:  Your arm or leg turns cold and blue.  Your arms or legs become red, warm, swollen, painful, or numb.  You have chest pain or trouble breathing.  You suddenly have weakness in your face, arm, or leg.  You become very confused or you cannot speak.  You suddenly have a very bad headache.  You suddenly cannot see. This information is not intended to replace advice given to you by your health care provider. Make sure you discuss any questions you have with your health care provider. Document Released: 07/17/2009 Document Revised: 09/28/2015 Document Reviewed: 09/30/2013 Elsevier Interactive Patient Education  2017 [employer].

## 2017-08-15 NOTE — Progress Notes (Signed)
[facility] [place]   CC: Follow up peripheral artery occlusive disease  History of Present Illness Nathan Pruitt is a 72 y.o. male who is s/p redo left to right fem-fem graft 12/30/07 by Dr. Scot Dock. He returns today for routine surveillance.  He denies claudication symptoms in his legs with walking, denies non healing wounds. He has right hip and knee pain with walking. He admits to not walking much, states he did not have any stamina.   Has a defibrillator in place, he denies history of stroke or TIA.  He had skin cancer removed from his head. He had 3 cardiac stents placed in March 2018, denies   He states he is rarely light-headed.   Pt Diabetic: No Pt smoker: former smoker, quit in 2010 when he had v-tach and ICD placed  Pt meds include: Statin :Yes ASA: Yes Other anticoagulants/antiplatelets: Plavix since cardiac stents placed March 2018     Past Medical History:  Diagnosis Date  . Abnormal PFTs 7/11   FVC 74%, FEV1 80%, ratio 75%, TLC 78%, DLCO 68%. this suggests a mild restrictive and obstructive defect. pt did have a response to bronchodilator. these PFTs were significantly better than the report from Dr. Alcide Clever in [place] done prior.   [name] AICD (automatic cardioverter/defibrillator) present   . Anxiety   . Atrial flutter ([place])    s/p isthmus ablation 5/10  . CAD (coronary artery disease)    hx of silent MI in 1993. likely an inferior MI. hx of 2D cardiogram in 3/09 showing EF of 40%. hx of myoview in [place] 3/09-nml. presented to [facility] 5/10 with VT and mildly elevated cardiac enzymes LHC (5/10): inferobasal dyskinesis with EF 35-40%. was chronic total occlusion of mid RCA with good collaterals. luminals LCA. this does not appear to be an acute cause of the 5/10 event  . Cancer ([place])    skin  . CHF (congestive heart failure) ([place])   . Chronic lower back pain   . HLD (hyperlipidemia)   . HTN (hypertension)   . Ischemic cardiomyopathy    EF  35-40% by LV-gram 5/10 with inferobasal dyskinesis. echo 5/10 showed EF 40% w/mild LVH, no sig. MR, inferobasal and posterobasal akinesis. echo (7/11): EF 50%, mild LVH, basal-mid inferoposterior akenesis  . Migraine    "visual migraine; maybe 12/year" (07/16/2016)  . PAD (peripheral artery disease) (H[facility])    s/p L-to-R fem-fem bypass performed by Dr. Scot Dock at [facility] in 2009   . Rheumatoid arthritis (HCC)    on leflunomide  . Silent myocardial infarction [facility]) 1993   "silent"  . Tobacco abuse    47 pack year hx; quit 8/09  . Ventricular tachycardia ([place])    likely scar-mediated. 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 He has duel chamber Medtronic ICD    Social History Social History   Tobacco Use  . Smoking status: Former Smoker    Packs/day: 1.00    Years: 47.00    Pack years: 47.00    Last attempt to quit: 2009    Years since quitting: 10.2  . Smokeless tobacco: Never Used  Substance Use Topics  . Alcohol use: Yes    Alcohol/week: 0.0 oz    Comment: 07/15/2016 "nothing for the last couple years"  . Drug use: No    Comment: prior     Family History Family History  Problem Relation Age of Onset  . Gastric cancer Mother   . Throat cancer Father   .  Peripheral vascular disease Father   . Heart attack Brother 63    Past Surgical History:  Procedure Laterality Date  . CARDIAC DEFIBRILLATOR PLACEMENT  09/22/2008  . CATARACT EXTRACTION W/ INTRAOCULAR LENS  IMPLANT, BILATERAL Bilateral 05/2016 - 06/2016  . CORONARY CTO INTERVENTION  07/16/2016  . CORONARY CTO INTERVENTION N/A 07/16/2016   Procedure: Coronary CTO Intervention;  Surgeon: Belva Crome, MD;  Location: [place] [facility];  Service: Cardiovascular;  Laterality: N/A;  . EP IMPLANTABLE DEVICE  09/22/08   Medtronic, ICD Model Number:  D274DRG, ICD Serial Number: [id number]  . FEMORAL ARTERY - FEMORAL ARTERY BYPASS GRAFT  march 2009   left to right bypass, first @ [facility], second at  [facility] by Dr Scot Dock  . ICD GENERATOR CHANGEOUT N/A 11/12/2016   Procedure: ICD Generator Changeout;  Surgeon: Thompson Grayer, MD;  Location: [place] [facility];  Service: Cardiovascular;  Laterality: N/A;  . RIGHT/LEFT HEART CATH AND CORONARY ANGIOGRAPHY N/A 07/12/2016   Procedure: Right/Left Heart Cath and Coronary Angiography;  Surgeon: Larey Dresser, MD;  Location: [place] [facility];  Service: Cardiovascular;  Laterality: N/A;  . TONSILLECTOMY    . VASECTOMY    . VENTRICULAR ABLATION SURGERY  09/2008    Allergies  Allergen Reactions  . Atorvastatin Other (See Comments)    Muscle pain  . Crestor [Rosuvastatin Calcium] Other (See Comments)    Muscle pain  . Losartan Other (See Comments)    Muscle pain   . Telmisartan Other (See Comments)    Stomach ache  . Enalapril Other (See Comments)    Upset stomach  . Lisinopril Cough    Current Outpatient Medications  Medication Sig Dispense Refill  . amiodarone (PACERONE) 200 MG tablet Take 1 tablet (200 mg total) by mouth daily. 90 tablet 3  . aspirin EC 81 MG tablet Take 1 tablet (81 mg total) by mouth daily.    . carvedilol (COREG) 25 MG tablet TAKE 1 TABLET (25 MG TOTAL) BY MOUTH 2 (TWO) TIMES DAILY WITH A MEAL. 180 tablet 2  . chlorhexidine (PERIDEX) 0.12 % solution Swish and rinse as needed for dental  3  . clopidogrel (PLAVIX) 75 MG tablet Take 1 tablet (75 mg total) by mouth daily with breakfast. Please prescribe generic 90 tablet 3  . eplerenone (INSPRA) 25 MG tablet Take 1 tablet (25 mg total) by mouth daily. 30 tablet 3  . ezetimibe (ZETIA) 10 MG tablet TAKE 1 TABLET (10 MG TOTAL) BY MOUTH DAILY. 90 tablet 2  . fluticasone (FLONASE) 50 MCG/ACT nasal spray Place 1-2 sprays into both nostrils daily as needed for allergies or rhinitis.    . furosemide (LASIX) 40 MG tablet Take 1 tablet (40 mg total) by mouth daily. 90 tablet 3  . leflunomide (ARAVA) 20 MG tablet Take 20 mg by mouth daily.      . Melatonin 10 MG TABS Take 10 mg  by mouth at bedtime as needed (sleep).     . simvastatin (ZOCOR) 20 MG tablet TAKE 1 TABLET (20 MG TOTAL) BY MOUTH AT BEDTIME. 90 tablet 3  . valsartan (DIOVAN) 320 MG tablet Take 0.5 tablets (160 mg total) by mouth 2 (two) times daily. 30 tablet 11   No current facility-administered medications for this visit.     ROS: See HPI for pertinent positives and negatives.   Physical Examination  Vitals:   08/15/17 0919 08/15/17 0922  BP: (!) 148/81 (!) 151/84  Pulse: (!) 50 (!) 50  Resp: 18   Temp: (!) 97.1 F (36.2 C)   TempSrc: Oral   SpO2: 97%   Weight: 183 lb (83 kg)   Height: 5\' 11"  (1.803 m)    Body mass index is 25.52 kg/m.  General: A&O x 3, WDWN . Gait: normal Eyes: PERRLA. Pulmonary: CTAB, respirations are non labored Cardiac: regular rhythm, bradycardic, no detected murmur. AICD palpated left side of chest.    Carotid Bruits Left Right   Negative Negative   Abdominal aortic pulse is prominently palpable. Radial pulses: 2+ palpable and =   VASCULAR EXAM: Extremitieswithout ischemic changes  without Gangrene; without open wounds. Palpable fem-fem graft pulse.     LE Pulses LEFT RIGHT   FEMORAL  2+palpable  2+palpable    POPLITEAL not palpable  not palpable   POSTERIOR TIBIAL  not palpable  2+ palpable   DORSALIS PEDIS  ANTERIOR TIBIAL 1+ palpable 1+ palpable   Abdomen: soft, NT, no palpable masses. Skin: no rashes, no ulcers, general pallor. Musculoskeletal: no muscle wasting or atrophy.  Neurologic: A&O X 3; appropriate affect, Sensation is normal; MOTOR FUNCTION:  moving all extremities equally, motor strength 5/5 throughout. Speech is fluent/normal. CN 2-12 intact. Psychiatric: Thought content is normal, mood  appropriate for clinical situation.     ASSESSMENT: Nathan Pruitt is a 72 y.o. male who is s/p redo left to right fem-fem graft 12/30/07. He has no claudication symptoms, no signs of ischemia in his feet/legs. Bilateral pedal pulses are palpable.  Fem-fem bypass graft pulse is palpable.   He has a prominently palpable abdominal aortic pulse, but the largest measurement is 3.0 cm.   ABI's and TBI's remain normal.  He has intentionally lost about 50 pounds.   He has no family history of aneurysms that he is aware, he has no back pain nor abdominal pain. CTA abd/pelvis in 2009 just prior to the redo of his left to right fem-fem bypass graft demonstrated no abdominal aortic aneurysm.    DATA  AAA Duplex (08-15-17): 3.0 cm dilatation of the distal abdominal aorta (no previous); Right CIA: 1.0 cm; Left CIA: 1.4 cm Ectatic abdominal aorta   ABI (Date: 08/15/2017):  R:   ABI: 1.04 (was 1.05 on 08-07-16),   PT: bi  DP: bi  TBI:  0.80  L:   ABI: 1.00 (was 1.02),   PT: bi  DP: bi  TBI: 0.88  Bilateral ABI and TBI remain normal with biphasic waveforms.    PLAN:  Graduated walking program discussed and how to achieve.  Based on the patient's vascular studies and examination, pt will return to clinic in 18 months with ABI' and AAA duplex. I advised him to notify us if he develops concerns re the circulation in his legs.   I discussed in depth with the patient the nature of atherosclerosis, and emphasized the importance of maximal medical management including strict control of blood pressure, blood glucose, and lipid levels, obtaining regular exercise, and continued cessation of smoking.  The patient is aware that without maximal medical management the underlying atherosclerotic disease process will progress, limiting the benefit of any interventions.  The patient was given information about PAD including signs, symptoms, treatment, what symptoms should prompt the  patient to seek immediate medical care, and risk reduction measures to take.  Clemon Chambers, RN, MSN, FNP-C [facility] [place] [facility] Phone: [phone]  Clinic MD: Donzetta Matters  08/15/17 9:52 AM

## 2017-08-17 ENCOUNTER — Encounter: Payer: Self-pay | Admitting: Family

## 2017-08-18 ENCOUNTER — Ambulatory Visit (INDEPENDENT_AMBULATORY_CARE_PROVIDER_SITE_OTHER): Payer: Medicare Other | Admitting: *Deleted

## 2017-08-18 ENCOUNTER — Telehealth: Payer: Self-pay | Admitting: Cardiology

## 2017-08-18 DIAGNOSIS — I255 Ischemic cardiomyopathy: Secondary | ICD-10-CM

## 2017-08-18 NOTE — Telephone Encounter (Signed)
Spoke with pt and reminded pt of remote transmission that is due today. Pt verbalized understanding.   

## 2017-08-19 NOTE — Progress Notes (Signed)
Remote ICD transmission.   

## 2017-08-20 ENCOUNTER — Encounter: Payer: Self-pay | Admitting: Cardiology

## 2017-08-20 LAB — CUP PACEART REMOTE DEVICE CHECK
Battery Remaining Longevity: 120 mo
Battery Voltage: 3.02 V
Brady Statistic AS VP Percent: 0.1 %
Brady Statistic RA Percent Paced: 43.17 %
Brady Statistic RV Percent Paced: 0.21 %
HIGH POWER IMPEDANCE MEASURED VALUE: 38 Ohm
HighPow Impedance: 51 Ohm
Implantable Lead Implant Date: 20100520
Implantable Lead Location: 753860
Implantable Lead Model: 5076
Implantable Pulse Generator Implant Date: 20180710
Lead Channel Impedance Value: 266 Ohm
Lead Channel Impedance Value: 323 Ohm
Lead Channel Impedance Value: 380 Ohm
Lead Channel Pacing Threshold Pulse Width: 0.4 ms
Lead Channel Sensing Intrinsic Amplitude: 4.75 mV
Lead Channel Setting Pacing Amplitude: 1.5 V
Lead Channel Setting Sensing Sensitivity: 0.3 mV
MDC IDC LEAD IMPLANT DT: 20100520
MDC IDC LEAD LOCATION: 753859
MDC IDC MSMT LEADCHNL RA PACING THRESHOLD AMPLITUDE: 0.5 V
MDC IDC MSMT LEADCHNL RA SENSING INTR AMPL: 1.375 mV
MDC IDC MSMT LEADCHNL RA SENSING INTR AMPL: 1.375 mV
MDC IDC MSMT LEADCHNL RV PACING THRESHOLD AMPLITUDE: 1.5 V
MDC IDC MSMT LEADCHNL RV PACING THRESHOLD PULSEWIDTH: 0.4 ms
MDC IDC MSMT LEADCHNL RV SENSING INTR AMPL: 4.75 mV
MDC IDC SESS DTM: 20190415193124
MDC IDC SET LEADCHNL RV PACING AMPLITUDE: 3 V
MDC IDC SET LEADCHNL RV PACING PULSEWIDTH: 0.4 ms
MDC IDC STAT BRADY AP VP PERCENT: 0.11 %
MDC IDC STAT BRADY AP VS PERCENT: 44.62 %
MDC IDC STAT BRADY AS VS PERCENT: 55.17 %

## 2017-08-26 ENCOUNTER — Other Ambulatory Visit (HOSPITAL_COMMUNITY): Payer: Self-pay | Admitting: Cardiology

## 2017-08-26 ENCOUNTER — Encounter: Payer: Self-pay | Admitting: Nurse Practitioner

## 2017-09-02 DIAGNOSIS — H02413 Mechanical ptosis of bilateral eyelids: Secondary | ICD-10-CM | POA: Diagnosis not present

## 2017-09-05 ENCOUNTER — Telehealth (HOSPITAL_COMMUNITY): Payer: Self-pay | Admitting: *Deleted

## 2017-09-05 NOTE — Telephone Encounter (Signed)
Received fax from [facility] pt needs clearance for suregery under IV sedation with Dr Georjean Mode on 09/17/17 and needs clearance to hold ASA and Plavix.  Per Dr Aundra Dubin:  "Patient is cleared medically for the above procedure as noted, hold Plavix 5 days prior to procedure, hold ASA 3 days prior to procedure."    Note faxed to them at [phone]

## 2017-09-08 ENCOUNTER — Other Ambulatory Visit (HOSPITAL_COMMUNITY): Payer: Self-pay | Admitting: Cardiology

## 2017-09-17 DIAGNOSIS — H02413 Mechanical ptosis of bilateral eyelids: Secondary | ICD-10-CM | POA: Diagnosis not present

## 2017-09-17 DIAGNOSIS — H02411 Mechanical ptosis of right eyelid: Secondary | ICD-10-CM | POA: Diagnosis not present

## 2017-09-17 DIAGNOSIS — H02412 Mechanical ptosis of left eyelid: Secondary | ICD-10-CM | POA: Diagnosis not present

## 2017-10-23 ENCOUNTER — Ambulatory Visit (HOSPITAL_BASED_OUTPATIENT_CLINIC_OR_DEPARTMENT_OTHER)
Admission: RE | Admit: 2017-10-23 | Discharge: 2017-10-23 | Disposition: A | Discharge status: Home / Self Care | Payer: Medicare Other | Source: Ambulatory Visit | Attending: Cardiology | Admitting: Cardiology

## 2017-10-23 ENCOUNTER — Ambulatory Visit (HOSPITAL_COMMUNITY)
Admission: RE | Admit: 2017-10-23 | Discharge: 2017-10-23 | Disposition: A | Discharge status: Home / Self Care | Payer: Medicare Other | Source: Ambulatory Visit | Attending: Family Medicine | Admitting: Family Medicine

## 2017-10-23 VITALS — BP 124/80 | HR 50 | Wt 189.4 lb

## 2017-10-23 DIAGNOSIS — I255 Ischemic cardiomyopathy: Secondary | ICD-10-CM | POA: Diagnosis not present

## 2017-10-23 DIAGNOSIS — M069 Rheumatoid arthritis, unspecified: Secondary | ICD-10-CM | POA: Insufficient documentation

## 2017-10-23 DIAGNOSIS — I130 Hypertensive heart and chronic kidney disease with heart failure and stage 1 through stage 4 chronic kidney disease, or unspecified chronic kidney disease: Secondary | ICD-10-CM | POA: Diagnosis not present

## 2017-10-23 DIAGNOSIS — I2510 Atherosclerotic heart disease of native coronary artery without angina pectoris: Secondary | ICD-10-CM | POA: Diagnosis not present

## 2017-10-23 DIAGNOSIS — I340 Nonrheumatic mitral (valve) insufficiency: Secondary | ICD-10-CM | POA: Diagnosis not present

## 2017-10-23 DIAGNOSIS — E785 Hyperlipidemia, unspecified: Secondary | ICD-10-CM | POA: Diagnosis not present

## 2017-10-23 DIAGNOSIS — Z7902 Long term (current) use of antithrombotics/antiplatelets: Secondary | ICD-10-CM | POA: Insufficient documentation

## 2017-10-23 DIAGNOSIS — F419 Anxiety disorder, unspecified: Secondary | ICD-10-CM | POA: Diagnosis not present

## 2017-10-23 DIAGNOSIS — I739 Peripheral vascular disease, unspecified: Secondary | ICD-10-CM | POA: Insufficient documentation

## 2017-10-23 DIAGNOSIS — Z87891 Personal history of nicotine dependence: Secondary | ICD-10-CM | POA: Diagnosis not present

## 2017-10-23 DIAGNOSIS — Z95810 Presence of automatic (implantable) cardiac defibrillator: Secondary | ICD-10-CM | POA: Diagnosis not present

## 2017-10-23 DIAGNOSIS — I4892 Unspecified atrial flutter: Secondary | ICD-10-CM | POA: Diagnosis not present

## 2017-10-23 DIAGNOSIS — I5022 Chronic systolic (congestive) heart failure: Secondary | ICD-10-CM | POA: Diagnosis not present

## 2017-10-23 DIAGNOSIS — Z79899 Other long term (current) drug therapy: Secondary | ICD-10-CM | POA: Diagnosis not present

## 2017-10-23 DIAGNOSIS — I472 Ventricular tachycardia, unspecified: Secondary | ICD-10-CM

## 2017-10-23 DIAGNOSIS — Z7951 Long term (current) use of inhaled steroids: Secondary | ICD-10-CM | POA: Insufficient documentation

## 2017-10-23 DIAGNOSIS — N183 Chronic kidney disease, stage 3 (moderate): Secondary | ICD-10-CM | POA: Diagnosis not present

## 2017-10-23 DIAGNOSIS — Z7982 Long term (current) use of aspirin: Secondary | ICD-10-CM | POA: Insufficient documentation

## 2017-10-23 DIAGNOSIS — I252 Old myocardial infarction: Secondary | ICD-10-CM | POA: Insufficient documentation

## 2017-10-23 DIAGNOSIS — I714 Abdominal aortic aneurysm, without rupture: Secondary | ICD-10-CM | POA: Diagnosis not present

## 2017-10-23 DIAGNOSIS — I2582 Chronic total occlusion of coronary artery: Secondary | ICD-10-CM | POA: Insufficient documentation

## 2017-10-23 LAB — COMPREHENSIVE METABOLIC PANEL
ALT: 25 U/L (ref 17–63)
ANION GAP: 8 (ref 5–15)
AST: 23 U/L (ref 15–41)
Albumin: 3.8 g/dL (ref 3.5–5.0)
Alkaline Phosphatase: 109 U/L (ref 38–126)
BUN: 18 mg/dL (ref 6–20)
CHLORIDE: 102 mmol/L (ref 101–111)
CO2: 28 mmol/L (ref 22–32)
CREATININE: 1.5 mg/dL — AB (ref 0.61–1.24)
Calcium: 9 mg/dL (ref 8.9–10.3)
GFR, EST AFRICAN AMERICAN: 52 mL/min — AB (ref >60)
GFR, EST NON AFRICAN AMERICAN: 45 mL/min — AB (ref >60)
Glucose, Bld: 112 mg/dL — ABNORMAL HIGH (ref 65–99)
Potassium: 4.8 mmol/L (ref 3.5–5.1)
SODIUM: 138 mmol/L (ref 135–145)
Total Bilirubin: 0.5 mg/dL (ref 0.3–1.2)
Total Protein: 7.1 g/dL (ref 6.5–8.1)

## 2017-10-23 LAB — T4, FREE: FREE T4: 0.98 ng/dL (ref 0.82–1.77)

## 2017-10-23 LAB — TSH: TSH: 5.982 u[IU]/mL — AB (ref 0.350–4.500)

## 2017-10-23 MED ORDER — EPLERENONE 50 MG PO TABS: 50.0000 mg | ORAL_TABLET | Freq: Every day | ORAL | 11 refills | Status: DC

## 2017-10-23 MED ORDER — AMIODARONE HCL 200 MG PO TABS
200.0000 mg | ORAL_TABLET | Freq: Every day | ORAL | 3 refills | Status: DC
Start: 2017-10-23 — End: 2018-10-02

## 2017-10-23 NOTE — Progress Notes (Signed)
Patient ID: Nathan Pruitt, male   DOB: 1945/12/02, 72 y.o.   MRN: [id number]     Advanced Heart Failure Clinic Note   PCP: Dr. Helene Kelp Cardiology: Dr. Aundra Dubin  72 yo with history of CAD, PAD, ischemic CMP, and VT s/p ICD placement returns for followup of CHF.  Echo in 2/17 showed EF 40% and regional WMAs.  In 3/18, he developed episodes of VT as well as increased dyspnea.  LHC/RHC was done, showing new occlusion of a large ramus (CTO, probably a month or so old at time of cath) and old CTO RCA with collaterals.  He had DES x 3 to ramus.  Filling pressures were optimized on RHC.  Amiodarone was increased to 200 mg daily from 100 mg daily.  No VT since PCI.   He is stable clinically.  Dyspnea walking up inclines or carrying a load for a long distance.  No orthopnea/PND.  No chest pain.  No lightheadedness. Echo done today showed EF 35-40% with wall motion abnormalities. Weight is up but he is not very active.  Labs (11/10): K 4.9, creatinine 1.4, LDL 70, HDL 31, LFTs normal Labs (3/11): K 4.4, creatinine 1.3, LDL 61, HDL 35 Labs (7/11): LDL 62, HDL 23 Labs (1/12): K 4.8, LFTs normal, creatinine 1.44, LDL 69, HDL 33 Labs (7/12): LFTs normal, LDL 81, HDL 36, HCT normal, K 4.3, creatinine 1.3, BNP 116, TSH normal Labs (1/13): LDL 39, HDl 31, LFTs normal, TSH  Labs (7/13): TSH normal, LFTs normal, LDL 69, HDL 40 Labs (8/13): K 4.5, creatinine 1.6 Labs (10/13): K 4.4, creatinine 1.4 Labs (3/14): LDL 108, HDL 30, LFTs normal Labs (9/14): K 4.2, creatinine 1.6, LFTs normal, LDL 114, TSH 10.6 (elevated), free T4/free T3 normal Labs (2/15): K 4.6, creatinine 1.5, LFTs normal, TSH 7.6 (elevated), free T4 and free T3 normal Labs (3/15): K 3.6, creatinine 1.5, LFTs normal, TSH normal Labs (9/15): K 4.3, creatinine 1.6, LFTs normal, TSH normal, LDL 115, HDL 24 Labs (10/16): K 4.5, creatinine 1.5, LDL 90, HDL 35, hgb 12.8 Labs (11/16): Low free T3, normal TSH and free T4 Labs (12/16): K 5, creatinine  1.8, LFTs normal, TSH 8.6 (mildly elevated) Labs (1/17): Free T4/TSH/free T3 normal, LFTs normal Labs (2/17): K 4.4, creatinine 2.4, LDL 131, HDL 34 Labs (4/17): K 4.2, creatinine 1.3, LDL 67, HDL 35 Labs (9/17): K 4.9, creatinine 2.2, LDL 54, HDL 35, free T3 and T4 normal, hgb 12 Labs (4/18): K 5, creatinine 1.78, LFTs normal, TSH elevated but free T3 and free T4 normal.  Labs (6/18): LDL 52, HDL 35, TSH elevated 6.8, LFTs normal Labs (7/18): K 4.3, creatinine 1.68 Labs (10/18): TSH 5.6, free T3 normal, free T4 normal, LFTs normal, K 4.9, creatinine 1.4 Labs (11/18): K 4.5, creatinine 1.46, LFTs normal Labs (4/19): K 4.8, creatinine 1.6, LFTs normal  Allergies (verified):  No Known Drug Allergies  Past Medical History: 1. Coronary artery disease. The patient reports history of silent MI in 1993.  This was likely an inferior MI (see below).  - The patient presented to [facility] in 5/10 with VT and mildly elevated cardiac enzymes. LHC (5/10):  Inferobasal dyskinesis with EF 35-40%.  There was chronic total occlusion of the mid RCA with good collaterals.  Luminals LCA.  This did not appear to be an acute cause of the 5/10 event.  - LHC (3/18): Known occlusion of RCA with collaterals, new occlusion of ramus.  DES x 3 to ramus.  2. Hypertension.  3. Hyperlipidemia: Intolerant of simvastatin, Crestor, Lipitor, pravastatin. .  4. Remote tobacco abuse with 47-pack-year history, quitting in August 2009.  5. Peripheral arterial disease.  - Status post left-to-right fem-fem bypass performed in [place] in March 2009.  - Status post redo left-to-right fem-fem bypass performed by Dr. Scot Dock at [facility] in 2009.  - ABIs normal 3/15, ABIs normal 3/16, ABIs normal 3/17, ABIs normal 4/18, ABIs normal 4/19.  6. Rheumatoid arthritis, on leflunomide.  7.  Ischemic cardiomyopathy:  EF 35-40% by LV-gram 5/10 with inferobasal dyskinesis.  Echo 5/10 showed EF 40% with mild LVH, no significant MR, inferobasal  and posterobasal akinesis.  Echo (7/11): EF 50%, mild LVH, basal-mid inferoposterior akinesis.  Echo (7/12): EF 45-50% with basal anterolateral, basal posterior, and basal to mid inferior akinesis.  Echo (4/16): EF 40-45%, basal to mid inferolateral AK, basal inferior AK, basal to mid anterolateral HK.  Echo (2/17): EF 40% with basal to mid inferior akinesis, basal inferolateral aneurysm, mid inferolateral akinesis, basal anterolateral hypokinesis, normal RV size and systolic function, PASP 25 mmHg.  - Echo (3/18) with EF 30-35%, moderate LV dilation, moderate MR, mildly decreased RV systolic function, PASP 51 mmmHg. - RHC (3/18): mean RA 4, PA 38/12, mean PCWP 17, CI 2.2.  - Echo (6/19): EF 35-40%, inferior/inferolateral/anterolateral WMAs, moderate MR likely infarct-related, normal RV size with mildly decreased systolic function.  8.  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  He has a dual chamber Medtronic ICD.  9.  Atrial flutter:  Status post isthmus ablation 5/10.   10.  PFTs (7/11): FVC 74%, FEV1 80%, ratio 75%, TLC 78%, DLCO 68%.  This suggests a mild restrictive defect and a mild obstructive defect.  He did have response to bronchodilator. These PFTs were significantly better than the report from Dr. Alcide Clever in [place] done prior.  He had last PFTs in 2/12 with no significant change.  11.  Anxiety 12.  Chronic cough: No relief with change from ACEI to ARB or with trial of PPI.  13.  CKD: Stage 3.  14. Mitral regurgitation: Moderate on 6/19 echo, suspect infarct-related.  15. AAA: 3.0 cm AAA on abdominal US 4/19.   Family History: Mother died in her late 27s with gastric cancer.  Father died in his late 64s with throat cancer and PVD.   He had a brother who died at 48 of an MI.   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 -47ppy hx, quit 2009.  Alcohol Use -  yes-minimal Regular Exercise - yes Drug Use - no (prior)  Review of Systems        All systems reviewed and negative except as per HPI.   Current Outpatient Medications  Medication Sig Dispense Refill  . amiodarone (PACERONE) 200 MG tablet Take 1 tablet (200 mg total) by mouth daily. 90 tablet 3  . aspirin EC 81 MG tablet Take 1 tablet (81 mg total) by mouth daily.    . carvedilol (COREG) 25 MG tablet TAKE 1 TABLET (25 MG TOTAL) BY MOUTH 2 (TWO) TIMES DAILY WITH A MEAL. 180 tablet 2  . chlorhexidine (PERIDEX) 0.12 % solution Swish and rinse as needed for dental  3  . clopidogrel (PLAVIX) 75 MG tablet Take 1 tablet (75 mg total) by mouth daily with breakfast. Please prescribe generic 90 tablet 3  . eplerenone (INSPRA) 50 MG tablet Take 1 tablet (50 mg total) by  mouth daily. 30 tablet 11  . ezetimibe (ZETIA) 10 MG tablet TAKE 1 TABLET (10 MG TOTAL) BY MOUTH DAILY. 90 tablet 2  . fluticasone (FLONASE) 50 MCG/ACT nasal spray Place 1-2 sprays into both nostrils daily as needed for allergies or rhinitis.    . furosemide (LASIX) 40 MG tablet Take 1 tablet (40 mg total) by mouth daily. 90 tablet 3  . leflunomide (ARAVA) 20 MG tablet Take 20 mg by mouth daily.      . Melatonin 10 MG TABS Take 10 mg by mouth at bedtime as needed (sleep).     . simvastatin (ZOCOR) 20 MG tablet TAKE 1 TABLET (20 MG TOTAL) BY MOUTH AT BEDTIME. 90 tablet 3  . valsartan (DIOVAN) 320 MG tablet Take 0.5 tablets (160 mg total) by mouth 2 (two) times daily. 30 tablet 11   No current facility-administered medications for this encounter.     Vitals:   10/23/17 0907  BP: 124/80  Pulse: (!) 50  SpO2: 96%  Weight: 189 lb 6.4 oz (85.9 kg)   Wt Readings from Last 3 Encounters:  10/23/17 189 lb 6.4 oz (85.9 kg)  08/15/17 183 lb (83 kg)  08/06/17 184 lb (83.5 kg)    General: NAD Neck: No JVD, no thyromegaly or thyroid nodule.  Lungs: Clear to auscultation bilaterally with normal respiratory effort. CV: Nondisplaced  PMI.  Heart regular S1/S2, no S3/S4, no murmur.  No peripheral edema.  No carotid bruit.  Normal pedal pulses.  Abdomen: Soft, nontender, no hepatosplenomegaly, no distention.  Skin: Intact without lesions or rashes.  Neurologic: Alert and oriented x 3.  Psych: Normal affect. Extremities: No clubbing or cyanosis.  HEENT: Normal.   Assessment/Plan:  1. Chronic systolic CHF Ischemic cardiomyopathy.  Medtronic ICD.  Echo (6/19) with EF 35-40%. NYHA class II symptoms.  Weight is up but he looks euvolemic on exam.  I suspect that weight gain is caloric.    - Continue Coreg 25 mg bid and valsartan 320 mg daily. - We have discussed transitioning to [facility], he wants to stay on valsartan (says Delene Loll would send him into donut hole too fast).     - Increase eplerenone today to 50 mg daily.  BMET today and again in 10 days.  - Continue Lasix 40 mg daily.  2. CAD Denies chest pain.  Had DES x 3 to ramus in 3/18.   - Continue ASA 81 mg daily, clopidogrel, Zocor 20 mg daily, Zetia 10 mg daily, and Coreg/ARB as above.   3. HLD  Stable on Zetia and simvastatin as above without myalgias.   4. Hx of VT.   Continue amiodarone 200 mg daily (increased from 100 mg daily after last VT event). Has ICD.  - TSH mildly elevated in 10/18 (similar to the past) but free T3 and T4 normal in past. Check TFTs and LFTs today.  - Knows to get a yearly eye exam 5. PAD He denies claudication.  Normal ABIs in 4/19, followed by [facility].   6. AAA 3.0 cm in 4/19, followed by [facility].   Followup in 4 months  Loralie Champagne 10/23/2017

## 2017-10-23 NOTE — Progress Notes (Signed)
  Echocardiogram 2D Echocardiogram has been performed.  Nathan Pruitt 10/23/2017, 8:54 AM

## 2017-10-23 NOTE — Patient Instructions (Signed)
Increase Eplerenone to 50 mg daily  Labs today  Labs in 10 days  Your physician recommends that you schedule a follow-up appointment in: 4 months

## 2017-10-25 LAB — T3, FREE: T3, Free: 2.2 pg/mL (ref 2.0–4.4)

## 2017-11-05 DIAGNOSIS — H26491 Other secondary cataract, right eye: Secondary | ICD-10-CM | POA: Diagnosis not present

## 2017-11-10 DIAGNOSIS — L219 Seborrheic dermatitis, unspecified: Secondary | ICD-10-CM | POA: Diagnosis not present

## 2017-11-10 DIAGNOSIS — C4441 Basal cell carcinoma of skin of scalp and neck: Secondary | ICD-10-CM | POA: Diagnosis not present

## 2017-11-10 DIAGNOSIS — I5022 Chronic systolic (congestive) heart failure: Secondary | ICD-10-CM | POA: Diagnosis not present

## 2017-11-10 DIAGNOSIS — L570 Actinic keratosis: Secondary | ICD-10-CM | POA: Diagnosis not present

## 2017-11-17 ENCOUNTER — Ambulatory Visit (INDEPENDENT_AMBULATORY_CARE_PROVIDER_SITE_OTHER): Payer: Medicare Other | Admitting: *Deleted

## 2017-11-17 DIAGNOSIS — I472 Ventricular tachycardia, unspecified: Secondary | ICD-10-CM

## 2017-11-17 DIAGNOSIS — I5022 Chronic systolic (congestive) heart failure: Secondary | ICD-10-CM

## 2017-11-17 NOTE — Progress Notes (Signed)
Remote ICD transmission.   

## 2017-11-18 ENCOUNTER — Telehealth: Payer: Self-pay | Admitting: Cardiology

## 2017-11-18 LAB — CUP PACEART REMOTE DEVICE CHECK
Battery Remaining Longevity: 119 mo
Brady Statistic AP VP Percent: 0.07 %
Brady Statistic AP VS Percent: 30.26 %
Brady Statistic AS VS Percent: 69.55 %
Brady Statistic RA Percent Paced: 30.25 %
Brady Statistic RV Percent Paced: 0.21 %
Date Time Interrogation Session: 20190715052503
HIGH POWER IMPEDANCE MEASURED VALUE: 53 Ohm
HighPow Impedance: 43 Ohm
Implantable Lead Implant Date: 20100520
Implantable Lead Location: 753860
Implantable Lead Model: 5076
Implantable Lead Model: 6947
Lead Channel Impedance Value: 266 Ohm
Lead Channel Impedance Value: 399 Ohm
Lead Channel Pacing Threshold Amplitude: 1.375 V
Lead Channel Pacing Threshold Pulse Width: 0.4 ms
Lead Channel Sensing Intrinsic Amplitude: 1.375 mV
Lead Channel Sensing Intrinsic Amplitude: 6.875 mV
Lead Channel Sensing Intrinsic Amplitude: 6.875 mV
Lead Channel Setting Pacing Pulse Width: 0.4 ms
MDC IDC LEAD IMPLANT DT: 20100520
MDC IDC LEAD LOCATION: 753859
MDC IDC MSMT BATTERY VOLTAGE: 3.03 V
MDC IDC MSMT LEADCHNL RA PACING THRESHOLD AMPLITUDE: 0.625 V
MDC IDC MSMT LEADCHNL RA PACING THRESHOLD PULSEWIDTH: 0.4 ms
MDC IDC MSMT LEADCHNL RA SENSING INTR AMPL: 1.375 mV
MDC IDC MSMT LEADCHNL RV IMPEDANCE VALUE: 342 Ohm
MDC IDC PG IMPLANT DT: 20180710
MDC IDC SET LEADCHNL RA PACING AMPLITUDE: 1.5 V
MDC IDC SET LEADCHNL RV PACING AMPLITUDE: 3 V
MDC IDC SET LEADCHNL RV SENSING SENSITIVITY: 0.3 mV
MDC IDC STAT BRADY AS VP PERCENT: 0.13 %

## 2017-11-18 NOTE — Telephone Encounter (Signed)
Patient called and stated that he wanted the 02-16-18 remote appt canceled b/c he was seeing Dr. Marigene Ehlers and Dr. Rayann Heman on that day and he does not see the point of doing 3 device checks in one day. I explained to the patient that we have changed the way we follow implanted devices and he still wanted the 02-16-18 remote cancelled and said he would speak to EP MD on 02-16-18 about this.

## 2017-11-19 ENCOUNTER — Encounter: Payer: Self-pay | Admitting: Cardiology

## 2017-11-20 ENCOUNTER — Encounter (HOSPITAL_COMMUNITY): Payer: Self-pay | Admitting: Cardiology

## 2017-11-20 ENCOUNTER — Telehealth (HOSPITAL_COMMUNITY): Payer: Self-pay | Admitting: *Deleted

## 2017-11-20 DIAGNOSIS — E663 Overweight: Secondary | ICD-10-CM | POA: Diagnosis not present

## 2017-11-20 DIAGNOSIS — M150 Primary generalized (osteo)arthritis: Secondary | ICD-10-CM | POA: Diagnosis not present

## 2017-11-20 DIAGNOSIS — Z79899 Other long term (current) drug therapy: Secondary | ICD-10-CM | POA: Diagnosis not present

## 2017-11-20 DIAGNOSIS — M0579 Rheumatoid arthritis with rheumatoid factor of multiple sites without organ or systems involvement: Secondary | ICD-10-CM | POA: Diagnosis not present

## 2017-11-20 DIAGNOSIS — M545 Low back pain: Secondary | ICD-10-CM | POA: Diagnosis not present

## 2017-11-20 DIAGNOSIS — Z6826 Body mass index (BMI) 26.0-26.9, adult: Secondary | ICD-10-CM | POA: Diagnosis not present

## 2017-11-20 MED ORDER — EPLERENONE 25 MG PO TABS: 50.0000 mg | ORAL_TABLET | Freq: Every day | ORAL | 3 refills | Status: DC

## 2017-11-20 NOTE — Telephone Encounter (Signed)
Per Dr.McLean decrease Eplerenone to 25mg  daily. Called pt he is aware and agreeable with plan.  (response to my chart message below)   Dr. Aundra Dubin,  Regarding the script for Eplerenone @ 50 mg. First, I had to get it filled for 2 25mg  a day or have to fill it for $14/30 day. So I had them fill it for 2 /25mg  per day for $1/30 day. I went to [facility] today and while there my BP was 90/60. I have also been getting dizzy at times. I was thinking maybe I should take 25 for two days and take 50 on the third day but leave my prescription as it is. What do you think?    Jakel [phone]

## 2017-12-02 DIAGNOSIS — C4442 Squamous cell carcinoma of skin of scalp and neck: Secondary | ICD-10-CM | POA: Diagnosis not present

## 2017-12-04 ENCOUNTER — Other Ambulatory Visit (HOSPITAL_COMMUNITY): Payer: Self-pay | Admitting: *Deleted

## 2017-12-11 ENCOUNTER — Telehealth (HOSPITAL_COMMUNITY): Payer: Self-pay | Admitting: Pharmacist

## 2017-12-11 NOTE — Telephone Encounter (Signed)
[facility] has denied a 90 day supply of Nathan Pruitt's valsartan and eplerenone. He is limited to a 30 day supply at retail and mail order.   Nathan Pruitt. Nathan Pruitt, PharmD, BCPS, CPP Clinical Pharmacist Phone: [phone] 12/11/2017 10:48 AM

## 2017-12-12 ENCOUNTER — Telehealth (HOSPITAL_COMMUNITY): Payer: Self-pay | Admitting: Pharmacist

## 2017-12-12 ENCOUNTER — Other Ambulatory Visit (HOSPITAL_COMMUNITY): Payer: Self-pay

## 2017-12-12 NOTE — Telephone Encounter (Signed)
Valsartan 160 mg PA approved by [facility] through 12/11/18.   Ruta Hinds. Velva Harman, PharmD, BCPS, CPP Clinical Pharmacist Phone: [phone] 12/12/2017 11:07 AM

## 2017-12-22 ENCOUNTER — Other Ambulatory Visit (HOSPITAL_COMMUNITY): Payer: Self-pay | Admitting: Cardiology

## 2018-02-09 DIAGNOSIS — C4442 Squamous cell carcinoma of skin of scalp and neck: Secondary | ICD-10-CM | POA: Diagnosis not present

## 2018-02-09 DIAGNOSIS — L853 Xerosis cutis: Secondary | ICD-10-CM | POA: Diagnosis not present

## 2018-02-09 DIAGNOSIS — N401 Enlarged prostate with lower urinary tract symptoms: Secondary | ICD-10-CM | POA: Diagnosis not present

## 2018-02-09 DIAGNOSIS — R972 Elevated prostate specific antigen [PSA]: Secondary | ICD-10-CM | POA: Diagnosis not present

## 2018-02-09 DIAGNOSIS — N3020 Other chronic cystitis without hematuria: Secondary | ICD-10-CM | POA: Diagnosis not present

## 2018-02-11 ENCOUNTER — Other Ambulatory Visit: Payer: Self-pay | Admitting: Internal Medicine

## 2018-02-16 ENCOUNTER — Ambulatory Visit (HOSPITAL_COMMUNITY)
Admission: RE | Admit: 2018-02-16 | Discharge: 2018-02-16 | Disposition: A | Discharge status: Home / Self Care | Payer: Medicare Other | Source: Ambulatory Visit | Attending: Cardiology | Admitting: Cardiology

## 2018-02-16 ENCOUNTER — Encounter: Payer: Self-pay | Admitting: Internal Medicine

## 2018-02-16 ENCOUNTER — Ambulatory Visit (INDEPENDENT_AMBULATORY_CARE_PROVIDER_SITE_OTHER): Payer: Medicare Other | Admitting: Internal Medicine

## 2018-02-16 VITALS — BP 108/60 | HR 53 | Ht 71.0 in | Wt 188.0 lb

## 2018-02-16 VITALS — BP 95/55 | HR 56 | Wt 188.4 lb

## 2018-02-16 DIAGNOSIS — I739 Peripheral vascular disease, unspecified: Secondary | ICD-10-CM | POA: Diagnosis not present

## 2018-02-16 DIAGNOSIS — Z7982 Long term (current) use of aspirin: Secondary | ICD-10-CM | POA: Insufficient documentation

## 2018-02-16 DIAGNOSIS — M069 Rheumatoid arthritis, unspecified: Secondary | ICD-10-CM | POA: Diagnosis not present

## 2018-02-16 DIAGNOSIS — N183 Chronic kidney disease, stage 3 (moderate): Secondary | ICD-10-CM | POA: Diagnosis not present

## 2018-02-16 DIAGNOSIS — I255 Ischemic cardiomyopathy: Secondary | ICD-10-CM | POA: Diagnosis not present

## 2018-02-16 DIAGNOSIS — I714 Abdominal aortic aneurysm, without rupture: Secondary | ICD-10-CM | POA: Diagnosis not present

## 2018-02-16 DIAGNOSIS — E785 Hyperlipidemia, unspecified: Secondary | ICD-10-CM | POA: Diagnosis not present

## 2018-02-16 DIAGNOSIS — I779 Disorder of arteries and arterioles, unspecified: Secondary | ICD-10-CM

## 2018-02-16 DIAGNOSIS — Z8249 Family history of ischemic heart disease and other diseases of the circulatory system: Secondary | ICD-10-CM | POA: Insufficient documentation

## 2018-02-16 DIAGNOSIS — E039 Hypothyroidism, unspecified: Secondary | ICD-10-CM | POA: Insufficient documentation

## 2018-02-16 DIAGNOSIS — Z7902 Long term (current) use of antithrombotics/antiplatelets: Secondary | ICD-10-CM | POA: Insufficient documentation

## 2018-02-16 DIAGNOSIS — I252 Old myocardial infarction: Secondary | ICD-10-CM | POA: Insufficient documentation

## 2018-02-16 DIAGNOSIS — Z87891 Personal history of nicotine dependence: Secondary | ICD-10-CM | POA: Insufficient documentation

## 2018-02-16 DIAGNOSIS — I2510 Atherosclerotic heart disease of native coronary artery without angina pectoris: Secondary | ICD-10-CM | POA: Insufficient documentation

## 2018-02-16 DIAGNOSIS — I472 Ventricular tachycardia, unspecified: Secondary | ICD-10-CM

## 2018-02-16 DIAGNOSIS — I5022 Chronic systolic (congestive) heart failure: Secondary | ICD-10-CM | POA: Diagnosis not present

## 2018-02-16 DIAGNOSIS — Z95810 Presence of automatic (implantable) cardiac defibrillator: Secondary | ICD-10-CM | POA: Insufficient documentation

## 2018-02-16 DIAGNOSIS — I130 Hypertensive heart and chronic kidney disease with heart failure and stage 1 through stage 4 chronic kidney disease, or unspecified chronic kidney disease: Secondary | ICD-10-CM | POA: Insufficient documentation

## 2018-02-16 DIAGNOSIS — F419 Anxiety disorder, unspecified: Secondary | ICD-10-CM | POA: Diagnosis not present

## 2018-02-16 DIAGNOSIS — Z79899 Other long term (current) drug therapy: Secondary | ICD-10-CM | POA: Diagnosis not present

## 2018-02-16 LAB — COMPREHENSIVE METABOLIC PANEL
ALBUMIN: 3.6 g/dL (ref 3.5–5.0)
ALK PHOS: 101 U/L (ref 38–126)
ALT: 21 U/L (ref 0–44)
AST: 18 U/L (ref 15–41)
Anion gap: 8 (ref 5–15)
BUN: 37 mg/dL — AB (ref 8–23)
CALCIUM: 8.7 mg/dL — AB (ref 8.9–10.3)
CO2: 25 mmol/L (ref 22–32)
CREATININE: 1.79 mg/dL — AB (ref 0.61–1.24)
Chloride: 105 mmol/L (ref 98–111)
GFR calc Af Amer: 42 mL/min — ABNORMAL LOW (ref >60)
GFR calc non Af Amer: 36 mL/min — ABNORMAL LOW (ref >60)
GLUCOSE: 113 mg/dL — AB (ref 70–99)
Potassium: 4.5 mmol/L (ref 3.5–5.1)
SODIUM: 138 mmol/L (ref 135–145)
Total Bilirubin: 0.5 mg/dL (ref 0.3–1.2)
Total Protein: 6.8 g/dL (ref 6.5–8.1)

## 2018-02-16 LAB — LIPID PANEL
CHOLESTEROL: 122 mg/dL (ref 0–200)
HDL: 31 mg/dL — AB (ref >40)
LDL CALC: 58 mg/dL (ref 0–99)
TRIGLYCERIDES: 165 mg/dL — AB (ref <150)
Total CHOL/HDL Ratio: 3.9 RATIO
VLDL: 33 mg/dL (ref 0–40)

## 2018-02-16 LAB — CBC
HEMATOCRIT: 39.2 % (ref 39.0–52.0)
Hemoglobin: 12.3 g/dL — ABNORMAL LOW (ref 13.0–17.0)
MCH: 29.5 pg (ref 26.0–34.0)
MCHC: 31.4 g/dL (ref 30.0–36.0)
MCV: 94 fL (ref 80.0–100.0)
NRBC: 0 % (ref 0.0–0.2)
Platelets: 264 10*3/uL (ref 150–400)
RBC: 4.17 MIL/uL — AB (ref 4.22–5.81)
RDW: 13.6 % (ref 11.5–15.5)
WBC: 9.8 10*3/uL (ref 4.0–10.5)

## 2018-02-16 LAB — T4, FREE: Free T4: 0.91 ng/dL (ref 0.82–1.77)

## 2018-02-16 LAB — TSH: TSH: 6.638 u[IU]/mL — ABNORMAL HIGH (ref 0.350–4.500)

## 2018-02-16 NOTE — Progress Notes (Signed)
Patient ID: Nathan Pruitt, male   DOB: October 15, 1945, 72 y.o.   MRN: [id number]     Advanced Heart Failure Clinic Note   PCP: Dr. Helene Kelp Cardiology: Dr. Aundra Dubin  72 yo with history of CAD, PAD, ischemic CMP, and VT s/p ICD placement returns for followup of CHF.  Echo in 2/17 showed EF 40% and regional WMAs.  In 3/18, he developed episodes of VT as well as increased dyspnea.  LHC/RHC was done, showing new occlusion of a large ramus (CTO, probably a month or so old at time of cath) and old CTO RCA with collaterals.  He had DES x 3 to ramus.  Filling pressures were optimized on RHC.  Amiodarone was increased to 200 mg daily from 100 mg daily.  No VT since PCI.   Echo in 6/19 showed EF 35-40% with wall motion abnormalities.   Weight is stable today.  He feels about the same, mild dyspnea walking up a hill but does ok on flat ground.  Not very active.  Taking all his meds (only on eplerenone 25 mg daily as he was lightheaded at 50 mg daily).  No chest pain.    Medtronic device interrogated: Thoracic impedance stable, no VT.   Labs (11/10): K 4.9, creatinine 1.4, LDL 70, HDL 31, LFTs normal Labs (3/11): K 4.4, creatinine 1.3, LDL 61, HDL 35 Labs (7/11): LDL 62, HDL 23 Labs (1/12): K 4.8, LFTs normal, creatinine 1.44, LDL 69, HDL 33 Labs (7/12): LFTs normal, LDL 81, HDL 36, HCT normal, K 4.3, creatinine 1.3, BNP 116, TSH normal Labs (1/13): LDL 39, HDl 31, LFTs normal, TSH  Labs (7/13): TSH normal, LFTs normal, LDL 69, HDL 40 Labs (8/13): K 4.5, creatinine 1.6 Labs (10/13): K 4.4, creatinine 1.4 Labs (3/14): LDL 108, HDL 30, LFTs normal Labs (9/14): K 4.2, creatinine 1.6, LFTs normal, LDL 114, TSH 10.6 (elevated), free T4/free T3 normal Labs (2/15): K 4.6, creatinine 1.5, LFTs normal, TSH 7.6 (elevated), free T4 and free T3 normal Labs (3/15): K 3.6, creatinine 1.5, LFTs normal, TSH normal Labs (9/15): K 4.3, creatinine 1.6, LFTs normal, TSH normal, LDL 115, HDL 24 Labs (10/16): K 4.5,  creatinine 1.5, LDL 90, HDL 35, hgb 12.8 Labs (11/16): Low free T3, normal TSH and free T4 Labs (12/16): K 5, creatinine 1.8, LFTs normal, TSH 8.6 (mildly elevated) Labs (1/17): Free T4/TSH/free T3 normal, LFTs normal Labs (2/17): K 4.4, creatinine 2.4, LDL 131, HDL 34 Labs (4/17): K 4.2, creatinine 1.3, LDL 67, HDL 35 Labs (9/17): K 4.9, creatinine 2.2, LDL 54, HDL 35, free T3 and T4 normal, hgb 12 Labs (4/18): K 5, creatinine 1.78, LFTs normal, TSH elevated but free T3 and free T4 normal.  Labs (6/18): LDL 52, HDL 35, TSH elevated 6.8, LFTs normal Labs (7/18): K 4.3, creatinine 1.68 Labs (10/18): TSH 5.6, free T3 normal, free T4 normal, LFTs normal, K 4.9, creatinine 1.4 Labs (11/18): K 4.5, creatinine 1.46, LFTs normal Labs (4/19): K 4.8, creatinine 1.6, LFTs normal Labs (6/19): TSH slightly elevated, free T3/T4 both normal, LFTs normal Labs (7/19): K 4.3, creatinine 1.3  Allergies (verified):  No Known Drug Allergies  Past Medical History: 1. Coronary artery disease. The patient reports history of silent MI in 1993.  This was likely an inferior MI (see below).  - The patient presented to [facility] in 5/10 with VT and mildly elevated cardiac enzymes. LHC (5/10):  Inferobasal dyskinesis with EF 35-40%.  There was chronic total occlusion of the mid RCA  with good collaterals.  Luminals LCA.  This did not appear to be an acute cause of the 5/10 event.  - LHC (3/18): Known occlusion of RCA with collaterals, new occlusion of ramus.  DES x 3 to ramus.  2. Hypertension.  3. Hyperlipidemia: Intolerant of simvastatin, Crestor, Lipitor, pravastatin. .  4. Remote tobacco abuse with 47-pack-year history, quitting in August 2009.  5. Peripheral arterial disease.  - Status post left-to-right fem-fem bypass performed in [place] in March 2009.  - Status post redo left-to-right fem-fem bypass performed by Dr. Scot Dock at [facility] in 2009.  - ABIs normal 3/15, ABIs normal 3/16, ABIs normal 3/17, ABIs  normal 4/18, ABIs normal 4/19.  6. Rheumatoid arthritis, on leflunomide.  7.  Ischemic cardiomyopathy:  EF 35-40% by LV-gram 5/10 with inferobasal dyskinesis.  Echo 5/10 showed EF 40% with mild LVH, no significant MR, inferobasal and posterobasal akinesis.  Echo (7/11): EF 50%, mild LVH, basal-mid inferoposterior akinesis.  Echo (7/12): EF 45-50% with basal anterolateral, basal posterior, and basal to mid inferior akinesis.  Echo (4/16): EF 40-45%, basal to mid inferolateral AK, basal inferior AK, basal to mid anterolateral HK.  Echo (2/17): EF 40% with basal to mid inferior akinesis, basal inferolateral aneurysm, mid inferolateral akinesis, basal anterolateral hypokinesis, normal RV size and systolic function, PASP 25 mmHg.  - Echo (3/18) with EF 30-35%, moderate LV dilation, moderate MR, mildly decreased RV systolic function, PASP 51 mmmHg. - RHC (3/18): mean RA 4, PA 38/12, mean PCWP 17, CI 2.2.  - Echo (6/19): EF 35-40%, inferior/inferolateral/anterolateral WMAs, moderate MR likely infarct-related, normal RV size with mildly decreased systolic function.  8.  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  He has a dual chamber Medtronic ICD.  9.  Atrial flutter:  Status post isthmus ablation 5/10.   10.  PFTs (7/11): FVC 74%, FEV1 80%, ratio 75%, TLC 78%, DLCO 68%.  This suggests a mild restrictive defect and a mild obstructive defect.  He did have response to bronchodilator. These PFTs were significantly better than the report from Dr. Alcide Clever in [place] done prior.  He had last PFTs in 2/12 with no significant change.  11.  Anxiety 12.  Chronic cough: No relief with change from ACEI to ARB or with trial of PPI.  13.  CKD: Stage 3.  14. Mitral regurgitation: Moderate on 6/19 echo, suspect infarct-related.  15. AAA: 3.0 cm AAA on abdominal US 4/19.   Family History: Mother died in her late 47s with gastric cancer.  Father died in his late 68s  with throat cancer and PVD.   He had a brother who died at 38 of an MI.   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 -47ppy hx, quit 2009.  Alcohol Use - yes-minimal Regular Exercise - yes Drug Use - no (prior)  Review of Systems        All systems reviewed and negative except as per HPI.   Current Outpatient Medications  Medication Sig Dispense Refill  . amiodarone (PACERONE) 200 MG tablet Take 1 tablet (200 mg total) by mouth daily. 90 tablet 3  . aspirin EC 81 MG tablet Take 1 tablet (81 mg total) by mouth daily.    . carvedilol (COREG) 25 MG tablet TAKE 1 TABLET (25 MG TOTAL) BY MOUTH 2 (TWO) TIMES DAILY WITH A MEAL. 180 tablet 2  . chlorhexidine (PERIDEX) 0.12 % solution Swish and rinse  as needed for dental  3  . clopidogrel (PLAVIX) 75 MG tablet Take 1 tablet (75 mg total) by mouth daily with breakfast. Please prescribe generic 90 tablet 3  . eplerenone (INSPRA) 25 MG tablet Take 2 tablets (50 mg total) by mouth daily. 60 tablet 3  . ezetimibe (ZETIA) 10 MG tablet TAKE 1 TABLET (10 MG TOTAL) BY MOUTH DAILY. 90 tablet 2  . fluticasone (FLONASE) 50 MCG/ACT nasal spray Place 1-2 sprays into both nostrils daily as needed for allergies or rhinitis.    . furosemide (LASIX) 40 MG tablet TAKE 1 TABLET BY MOUTH EVERY DAY 90 tablet 1  . leflunomide (ARAVA) 20 MG tablet Take 20 mg by mouth daily.      . Melatonin 10 MG TABS Take 10 mg by mouth at bedtime as needed (sleep).     . simvastatin (ZOCOR) 20 MG tablet TAKE 1 TABLET (20 MG TOTAL) BY MOUTH AT BEDTIME. 90 tablet 3  . valsartan (DIOVAN) 160 MG tablet Take 160 mg by mouth 2 (two) times daily.     No current facility-administered medications for this encounter.     Vitals:   02/16/18 0834  BP: (!) 95/55  Pulse: (!) 56  SpO2: 96%  Weight: 85.5 kg (188 lb 6.4 oz)   Wt Readings from Last 3 Encounters:  02/16/18 85.3 kg (188 lb)  02/16/18 85.5 kg (188 lb 6.4 oz)    10/23/17 85.9 kg (189 lb 6.4 oz)    General: NAD Neck: No JVD, no thyromegaly or thyroid nodule.  Lungs: Clear to auscultation bilaterally with normal respiratory effort. CV: Nondisplaced PMI.  Heart regular S1/S2, no S3/S4, no murmur.  No peripheral edema.  No carotid bruit.  Normal pedal pulses.  Abdomen: Soft, nontender, no hepatosplenomegaly, no distention.  Skin: Intact without lesions or rashes.  Neurologic: Alert and oriented x 3.  Psych: Normal affect. Extremities: No clubbing or cyanosis.  HEENT: Normal.   Assessment/Plan:  1. Chronic systolic CHF Ischemic cardiomyopathy.  Medtronic ICD.  Echo (6/19) with EF 35-40%. NYHA class II symptoms.  Weight is stable.  He is euvolemic by exam and Optivol. - Continue Coreg 25 mg bid and valsartan 320 mg daily. - We have discussed transitioning to [facility], he wants to stay on valsartan (says Delene Loll would send him into donut hole too fast).     - Continue eplerenone 25 mg daily, did not tolerate increase to 50 mg daily due to lightheadedness.  - Continue Lasix 40 mg daily.  2. CAD Denies chest pain.  Had DES x 3 to ramus in 3/18.   - Continue ASA 81 mg daily, clopidogrel, Zocor 20 mg daily, Zetia 10 mg daily, and Coreg/ARB as above.   3. HLD  Stable on Zetia and simvastatin as above without myalgias.   - Check lipids today.  4. Hx of VT.   Continue amiodarone 200 mg daily (increased from 100 mg daily after last VT event). Has ICD.  - Subclinical hypothyroidism (has had mildly elevated TSH with normal free T3 and free T4).  Repeat TSH/free T3/free T4 today.  If TSH rises significantly, probably needs treatment with low dose Levoxyl.  - Check LFTs today.  - Knows to get a yearly eye exam 5. PAD He denies claudication.  Normal ABIs in 4/19, followed by [facility].   6. AAA 3.0 cm in 4/19, followed by [facility].   Followup in 4 months  Loralie Champagne 02/16/2018

## 2018-02-16 NOTE — Progress Notes (Signed)
PCP: Ronita Hipps, MD Primary Cardiologist: Dr Aundra Dubin Primary EP: Dr Rayann Heman  Nathan Pruitt is a 72 y.o. male who presents today for routine electrophysiology followup.  Since last being seen in our clinic, the patient reports doing very well.  Today, he denies symptoms of palpitations, chest pain, shortness of breath,  lower extremity edema, dizziness, presyncope, syncope, or ICD shocks.  The patient is otherwise without complaint today.   Past Medical History:  Diagnosis Date  . Abnormal PFTs 7/11   FVC 74%, FEV1 80%, ratio 75%, TLC 78%, DLCO 68%. this suggests a mild restrictive and obstructive defect. pt did have a response to bronchodilator. these PFTs were significantly better than the report from Dr. Alcide Clever in [place] done prior.   [name] AICD (automatic cardioverter/defibrillator) present   . Anxiety   . Atrial flutter ([place])    s/p isthmus ablation 5/10  . CAD (coronary artery disease)    hx of silent MI in 1993. likely an inferior MI. hx of 2D cardiogram in 3/09 showing EF of 40%. hx of myoview in [place] 3/09-nml. presented to [facility] 5/10 with VT and mildly elevated cardiac enzymes LHC (5/10): inferobasal dyskinesis with EF 35-40%. was chronic total occlusion of mid RCA with good collaterals. luminals LCA. this does not appear to be an acute cause of the 5/10 event  . Cancer ([employer])    skin  . CHF (congestive heart failure) ([place])   . Chronic lower back pain   . HLD (hyperlipidemia)   . HTN (hypertension)   . Ischemic cardiomyopathy    EF 35-40% by LV-gram 5/10 with inferobasal dyskinesis. echo 5/10 showed EF 40% w/mild LVH, no sig. MR, inferobasal and posterobasal akinesis. echo (7/11): EF 50%, mild LVH, basal-mid inferoposterior akenesis  . Migraine    "visual migraine; maybe 12/year" (07/16/2016)  . PAD (peripheral artery disease) (H[facility])    s/p L-to-R fem-fem bypass performed by Dr. Scot Dock at [facility] in 2009   . Rheumatoid arthritis (HCC)    on leflunomide  . Silent myocardial  infarction [facility]) 1993   "silent"  . Tobacco abuse    47 pack year hx; quit 8/09  . Ventricular tachycardia ([place])    likely scar-mediated. 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 He has duel chamber Medtronic ICD   Past Surgical History:  Procedure Laterality Date  . CARDIAC DEFIBRILLATOR PLACEMENT  09/22/2008  . CATARACT EXTRACTION W/ INTRAOCULAR LENS  IMPLANT, BILATERAL Bilateral 05/2016 - 06/2016  . CORONARY CTO INTERVENTION  07/16/2016  . CORONARY CTO INTERVENTION N/A 07/16/2016   Procedure: Coronary CTO Intervention;  Surgeon: Belva Crome, MD;  Location: [place] [facility];  Service: Cardiovascular;  Laterality: N/A;  . EP IMPLANTABLE DEVICE  09/22/08   Medtronic, ICD Model Number:  [id number], ICD Serial Number: [id number]  . FEMORAL ARTERY - FEMORAL ARTERY BYPASS GRAFT  march 2009   left to right bypass, first @ [facility], second at [facility] by Dr Scot Dock  . ICD GENERATOR CHANGEOUT N/A 11/12/2016   Procedure: ICD Generator Changeout;  Surgeon: Thompson Grayer, MD;  Location: [place] [facility];  Service: Cardiovascular;  Laterality: N/A;  . RIGHT/LEFT HEART CATH AND CORONARY ANGIOGRAPHY N/A 07/12/2016   Procedure: Right/Left Heart Cath and Coronary Angiography;  Surgeon: Larey Dresser, MD;  Location: [place] [facility];  Service: Cardiovascular;  Laterality: N/A;  . TONSILLECTOMY    . VASECTOMY    . VENTRICULAR ABLATION SURGERY  09/2008    ROS- all  systems are reviewed and negative except as per HPI above  Current Outpatient Medications  Medication Sig Dispense Refill  . amiodarone (PACERONE) 200 MG tablet Take 1 tablet (200 mg total) by mouth daily. 90 tablet 3  . aspirin EC 81 MG tablet Take 1 tablet (81 mg total) by mouth daily.    . carvedilol (COREG) 25 MG tablet TAKE 1 TABLET (25 MG TOTAL) BY MOUTH 2 (TWO) TIMES DAILY WITH A MEAL. 180 tablet 2  . chlorhexidine (PERIDEX) 0.12 % solution Swish and rinse as needed for dental  3  . clopidogrel (PLAVIX)  75 MG tablet Take 1 tablet (75 mg total) by mouth daily with breakfast. Please prescribe generic 90 tablet 3  . eplerenone (INSPRA) 25 MG tablet Take 2 tablets (50 mg total) by mouth daily. 60 tablet 3  . ezetimibe (ZETIA) 10 MG tablet TAKE 1 TABLET (10 MG TOTAL) BY MOUTH DAILY. 90 tablet 2  . fluticasone (FLONASE) 50 MCG/ACT nasal spray Place 1-2 sprays into both nostrils daily as needed for allergies or rhinitis.    . furosemide (LASIX) 40 MG tablet TAKE 1 TABLET BY MOUTH EVERY DAY 90 tablet 1  . leflunomide (ARAVA) 20 MG tablet Take 20 mg by mouth daily.      . Melatonin 10 MG TABS Take 10 mg by mouth at bedtime as needed (sleep).     . simvastatin (ZOCOR) 20 MG tablet TAKE 1 TABLET (20 MG TOTAL) BY MOUTH AT BEDTIME. 90 tablet 3  . valsartan (DIOVAN) 160 MG tablet Take 160 mg by mouth 2 (two) times daily.     No current facility-administered medications for this visit.     Physical Exam: Vitals:   02/16/18 0950  BP: 108/60  Pulse: (!) 53  SpO2: 97%  Weight: 188 lb (85.3 kg)  Height: 5\' 11"  (1.803 m)    GEN- The patient is well appearing, alert and oriented x 3 today.   Head- normocephalic, atraumatic Eyes-  Sclera clear, conjunctiva pink Ears- hearing intact Oropharynx- clear Lungs- Clear to ausculation bilaterally, normal work of breathing Chest- ICD pocket is well healed Heart- Regular rate and rhythm, no murmurs, rubs or gallops, PMI not laterally displaced GI- soft, NT, ND, + BS Extremities- no clubbing, cyanosis, or edema  ICD interrogation- reviewed in detail today,  See PACEART report  ekg tracing ordered today is personally reviewed and shows sinus rhythm, 53 bpm, PR 280 msec, IVCD (QRS 156 msec)  Wt Readings from Last 3 Encounters:  02/16/18 188 lb (85.3 kg)  02/16/18 188 lb 6.4 oz (85.5 kg)  10/23/17 189 lb 6.4 oz (85.9 kg)    Assessment and Plan:  1.  Chronic systolic dysfunction/ ischemic CM/ CAD euvolemic today Stable on an appropriate medical  regimen Normal ICD function See Pace Art report No changes today EF 35-40%, moderate MR  2. VT Well controlled  On amiodarone Lfts, tfts have been ordered for today (results currently pending) His TFTS have previously been abnormal. I have advised follow-up with PCP again today  3. HTN Stable No change required today  [facility] Return to see EP NP in a year I did offer EP follow-up in [place] with Dr Curt Bears however he would prefer to continue to follow here with me at this time.  Thompson Grayer MD, [facility] 02/16/2018 10:04 AM

## 2018-02-16 NOTE — Patient Instructions (Signed)
Labs done today, we will send a copy to Dr Amil Amen  Your physician recommends that you schedule a follow-up appointment in: 4 months

## 2018-02-16 NOTE — Patient Instructions (Addendum)
Medication Instructions:  Your physician recommends that you continue on your current medications as directed. Please refer to the Current Medication list given to you today.  Labwork: None ordered.  Testing/Procedures: None ordered.  Follow-Up: Your physician wants you to follow-up in: one year with Chanetta Marshall.   You will receive a reminder letter in the mail two months in advance. If you don't receive a letter, please call our office to schedule the follow-up appointment.  Remote monitoring is used to monitor your ICD from home. This monitoring reduces the number of office visits required to check your device to one time per year. It allows [place] to keep an eye on the functioning of your device to ensure it is working properly. You are scheduled for a device check from home on 05/18/2018. You may send your transmission at any time that day. If you have a wireless device, the transmission will be sent automatically. After your physician reviews your transmission, you will receive a postcard with your next transmission date.  Any Other Special Instructions Will Be Listed Below (If Applicable).  If you need a refill on your cardiac medications before your next appointment, please call your pharmacy.

## 2018-02-17 ENCOUNTER — Encounter (HOSPITAL_COMMUNITY): Payer: Self-pay

## 2018-02-17 LAB — CUP PACEART INCLINIC DEVICE CHECK
Battery Remaining Longevity: 117 mo
Battery Voltage: 3.02 V
Brady Statistic AS VS Percent: 67.75 %
Brady Statistic RV Percent Paced: 0.3 %
Date Time Interrogation Session: 20191014135559
HIGH POWER IMPEDANCE MEASURED VALUE: 42 Ohm
HighPow Impedance: 56 Ohm
Implantable Lead Implant Date: 20100520
Implantable Lead Location: 753859
Implantable Lead Location: 753860
Implantable Pulse Generator Implant Date: 20180710
Lead Channel Impedance Value: 304 Ohm
Lead Channel Pacing Threshold Amplitude: 0.625 V
Lead Channel Pacing Threshold Amplitude: 1.375 V
Lead Channel Pacing Threshold Pulse Width: 0.4 ms
Lead Channel Sensing Intrinsic Amplitude: 1.25 mV
Lead Channel Sensing Intrinsic Amplitude: 8 mV
Lead Channel Setting Pacing Amplitude: 2.5 V
Lead Channel Setting Sensing Sensitivity: 0.3 mV
MDC IDC LEAD IMPLANT DT: 20100520
MDC IDC MSMT LEADCHNL RA IMPEDANCE VALUE: 399 Ohm
MDC IDC MSMT LEADCHNL RA SENSING INTR AMPL: 2 mV
MDC IDC MSMT LEADCHNL RV IMPEDANCE VALUE: 342 Ohm
MDC IDC MSMT LEADCHNL RV PACING THRESHOLD PULSEWIDTH: 0.4 ms
MDC IDC MSMT LEADCHNL RV SENSING INTR AMPL: 8.625 mV
MDC IDC SET LEADCHNL RA PACING AMPLITUDE: 1.5 V
MDC IDC SET LEADCHNL RV PACING PULSEWIDTH: 0.4 ms
MDC IDC STAT BRADY AP VP PERCENT: 0.08 %
MDC IDC STAT BRADY AP VS PERCENT: 31.97 %
MDC IDC STAT BRADY AS VP PERCENT: 0.2 %
MDC IDC STAT BRADY RA PERCENT PACED: 31.91 %

## 2018-02-17 LAB — T3, FREE: T3, Free: 2.3 pg/mL (ref 2.0–4.4)

## 2018-02-18 ENCOUNTER — Encounter (HOSPITAL_COMMUNITY): Payer: Self-pay | Admitting: *Deleted

## 2018-02-18 ENCOUNTER — Telehealth (HOSPITAL_COMMUNITY): Payer: Self-pay | Admitting: *Deleted

## 2018-02-18 ENCOUNTER — Encounter (HOSPITAL_COMMUNITY): Payer: Self-pay

## 2018-02-18 NOTE — Telephone Encounter (Signed)
Mailed pt lipid letter with results, also faxed copy of all labs done 10/14 to Dr Amil Amen

## 2018-03-04 ENCOUNTER — Other Ambulatory Visit (HOSPITAL_COMMUNITY): Payer: Self-pay | Admitting: Cardiology

## 2018-03-15 ENCOUNTER — Other Ambulatory Visit (HOSPITAL_COMMUNITY): Payer: Self-pay | Admitting: Cardiology

## 2018-03-23 ENCOUNTER — Other Ambulatory Visit: Payer: Self-pay

## 2018-03-24 DIAGNOSIS — R197 Diarrhea, unspecified: Secondary | ICD-10-CM | POA: Diagnosis not present

## 2018-03-24 DIAGNOSIS — Z1211 Encounter for screening for malignant neoplasm of colon: Secondary | ICD-10-CM | POA: Diagnosis not present

## 2018-03-24 DIAGNOSIS — Z23 Encounter for immunization: Secondary | ICD-10-CM | POA: Diagnosis not present

## 2018-04-13 ENCOUNTER — Encounter (HOSPITAL_COMMUNITY): Payer: Self-pay

## 2018-04-13 DIAGNOSIS — Z79899 Other long term (current) drug therapy: Secondary | ICD-10-CM | POA: Diagnosis not present

## 2018-04-13 DIAGNOSIS — E785 Hyperlipidemia, unspecified: Secondary | ICD-10-CM | POA: Diagnosis not present

## 2018-04-13 DIAGNOSIS — Z6825 Body mass index (BMI) 25.0-25.9, adult: Secondary | ICD-10-CM | POA: Diagnosis not present

## 2018-04-13 DIAGNOSIS — Z9181 History of falling: Secondary | ICD-10-CM | POA: Diagnosis not present

## 2018-04-13 DIAGNOSIS — I10 Essential (primary) hypertension: Secondary | ICD-10-CM | POA: Diagnosis not present

## 2018-04-14 ENCOUNTER — Encounter (HOSPITAL_COMMUNITY): Payer: Self-pay

## 2018-04-14 ENCOUNTER — Encounter (HOSPITAL_COMMUNITY): Payer: Self-pay | Admitting: *Deleted

## 2018-04-14 ENCOUNTER — Other Ambulatory Visit (HOSPITAL_COMMUNITY): Payer: Self-pay | Admitting: Cardiology

## 2018-04-14 NOTE — Progress Notes (Signed)
Entered in error

## 2018-04-16 ENCOUNTER — Encounter (HOSPITAL_COMMUNITY): Payer: Self-pay

## 2018-04-17 ENCOUNTER — Telehealth (HOSPITAL_COMMUNITY): Payer: Self-pay

## 2018-04-17 NOTE — Telephone Encounter (Signed)
Pt called in response to my chart per Dr. Aundra Dubin will continue to monitor kidney function, prescription for BMET mailed to PT to have blood work done before next visit,

## 2018-04-30 ENCOUNTER — Telehealth (HOSPITAL_COMMUNITY): Payer: Self-pay

## 2018-04-30 NOTE — Telephone Encounter (Signed)
Prior authorization through [employer] insurance company was initiated for valsartan medication and sent via cmm on 04/30/2018.  Prior authorization through [employer] insurance company was APPROVED for valsartan and will expire on 04/30/2019.

## 2018-05-11 ENCOUNTER — Other Ambulatory Visit (HOSPITAL_COMMUNITY): Payer: Self-pay

## 2018-05-11 MED ORDER — EPLERENONE 25 MG PO TABS: ORAL_TABLET | ORAL | 2 refills | Status: DC

## 2018-05-11 MED ORDER — VALSARTAN 80 MG PO TABS: 80.0000 mg | ORAL_TABLET | Freq: Two times a day (BID) | ORAL | 2 refills | Status: DC

## 2018-05-11 MED ORDER — FUROSEMIDE 40 MG PO TABS: 40.0000 mg | ORAL_TABLET | Freq: Every day | ORAL | 2 refills | Status: DC

## 2018-05-13 ENCOUNTER — Encounter (HOSPITAL_COMMUNITY): Payer: Self-pay | Admitting: Cardiology

## 2018-05-13 DIAGNOSIS — I5022 Chronic systolic (congestive) heart failure: Secondary | ICD-10-CM | POA: Diagnosis not present

## 2018-05-13 DIAGNOSIS — D2239 Melanocytic nevi of other parts of face: Secondary | ICD-10-CM | POA: Diagnosis not present

## 2018-05-13 DIAGNOSIS — L821 Other seborrheic keratosis: Secondary | ICD-10-CM | POA: Diagnosis not present

## 2018-05-13 DIAGNOSIS — D225 Melanocytic nevi of trunk: Secondary | ICD-10-CM | POA: Diagnosis not present

## 2018-05-13 DIAGNOSIS — L728 Other follicular cysts of the skin and subcutaneous tissue: Secondary | ICD-10-CM | POA: Diagnosis not present

## 2018-05-14 ENCOUNTER — Telehealth (HOSPITAL_COMMUNITY): Payer: Self-pay | Admitting: Cardiology

## 2018-05-14 MED ORDER — EPLERENONE 25 MG PO TABS: 12.5000 mg | ORAL_TABLET | Freq: Every day | ORAL | 2 refills | Status: DC

## 2018-05-14 NOTE — Telephone Encounter (Signed)
Abnormal lab results received Labs drawn 05/13/2018 K 5.2 BUN 28 Cr 1.90  Per VO Lillia Mountain, NP Hold eplernone x 2 days, then decrease to 12.5 mg daily repeat BMET in 1 week   Patient aware and voiced understanding rx for repeat labs mailed to patients home

## 2018-05-18 ENCOUNTER — Ambulatory Visit (INDEPENDENT_AMBULATORY_CARE_PROVIDER_SITE_OTHER): Payer: Medicare Other

## 2018-05-18 DIAGNOSIS — I472 Ventricular tachycardia, unspecified: Secondary | ICD-10-CM

## 2018-05-18 DIAGNOSIS — I255 Ischemic cardiomyopathy: Secondary | ICD-10-CM

## 2018-05-19 NOTE — Progress Notes (Signed)
Remote ICD transmission.   

## 2018-05-20 LAB — CUP PACEART REMOTE DEVICE CHECK
Battery Remaining Longevity: 114 mo
Brady Statistic AP VP Percent: 0.05 %
Brady Statistic AP VS Percent: 15.64 %
Brady Statistic RA Percent Paced: 15.68 %
Date Time Interrogation Session: 20200113083522
HIGH POWER IMPEDANCE MEASURED VALUE: 39 Ohm
HighPow Impedance: 50 Ohm
Implantable Lead Implant Date: 20100520
Implantable Lead Location: 753860
Implantable Lead Model: 5076
Implantable Pulse Generator Implant Date: 20180710
Lead Channel Impedance Value: 266 Ohm
Lead Channel Pacing Threshold Amplitude: 0.625 V
Lead Channel Pacing Threshold Pulse Width: 0.4 ms
Lead Channel Setting Sensing Sensitivity: 0.3 mV
MDC IDC LEAD IMPLANT DT: 20100520
MDC IDC LEAD LOCATION: 753859
MDC IDC MSMT BATTERY VOLTAGE: 3.02 V
MDC IDC MSMT LEADCHNL RA IMPEDANCE VALUE: 380 Ohm
MDC IDC MSMT LEADCHNL RA SENSING INTR AMPL: 1 mV
MDC IDC MSMT LEADCHNL RA SENSING INTR AMPL: 1 mV
MDC IDC MSMT LEADCHNL RV IMPEDANCE VALUE: 323 Ohm
MDC IDC MSMT LEADCHNL RV PACING THRESHOLD AMPLITUDE: 1.625 V
MDC IDC MSMT LEADCHNL RV PACING THRESHOLD PULSEWIDTH: 0.4 ms
MDC IDC MSMT LEADCHNL RV SENSING INTR AMPL: 6.375 mV
MDC IDC MSMT LEADCHNL RV SENSING INTR AMPL: 6.375 mV
MDC IDC SET LEADCHNL RA PACING AMPLITUDE: 1.5 V
MDC IDC SET LEADCHNL RV PACING AMPLITUDE: 3.25 V
MDC IDC SET LEADCHNL RV PACING PULSEWIDTH: 0.4 ms
MDC IDC STAT BRADY AS VP PERCENT: 0.51 %
MDC IDC STAT BRADY AS VS PERCENT: 83.79 %
MDC IDC STAT BRADY RV PERCENT PACED: 0.59 %

## 2018-05-21 ENCOUNTER — Other Ambulatory Visit (HOSPITAL_COMMUNITY): Payer: Self-pay | Admitting: Cardiology

## 2018-05-21 ENCOUNTER — Other Ambulatory Visit (HOSPITAL_COMMUNITY): Payer: Self-pay

## 2018-05-22 DIAGNOSIS — E663 Overweight: Secondary | ICD-10-CM | POA: Diagnosis not present

## 2018-05-22 DIAGNOSIS — M79671 Pain in right foot: Secondary | ICD-10-CM | POA: Diagnosis not present

## 2018-05-22 DIAGNOSIS — M545 Low back pain: Secondary | ICD-10-CM | POA: Diagnosis not present

## 2018-05-22 DIAGNOSIS — Z6826 Body mass index (BMI) 26.0-26.9, adult: Secondary | ICD-10-CM | POA: Diagnosis not present

## 2018-05-22 DIAGNOSIS — Z79899 Other long term (current) drug therapy: Secondary | ICD-10-CM | POA: Diagnosis not present

## 2018-05-22 DIAGNOSIS — M150 Primary generalized (osteo)arthritis: Secondary | ICD-10-CM | POA: Diagnosis not present

## 2018-05-22 DIAGNOSIS — M0579 Rheumatoid arthritis with rheumatoid factor of multiple sites without organ or systems involvement: Secondary | ICD-10-CM | POA: Diagnosis not present

## 2018-05-26 ENCOUNTER — Telehealth (HOSPITAL_COMMUNITY): Payer: Self-pay | Admitting: *Deleted

## 2018-05-26 ENCOUNTER — Other Ambulatory Visit (HOSPITAL_COMMUNITY): Payer: Self-pay

## 2018-05-26 ENCOUNTER — Telehealth (HOSPITAL_COMMUNITY): Payer: Self-pay

## 2018-05-26 DIAGNOSIS — I255 Ischemic cardiomyopathy: Secondary | ICD-10-CM

## 2018-05-26 NOTE — Telephone Encounter (Signed)
Attempted to reach pt to set up repeat bmet on 05/27/2018 per Dr.McLean. no answer.

## 2018-05-26 NOTE — Telephone Encounter (Signed)
Bmet faxed to [facility] at [phone].

## 2018-05-27 DIAGNOSIS — I255 Ischemic cardiomyopathy: Secondary | ICD-10-CM | POA: Diagnosis not present

## 2018-05-29 ENCOUNTER — Other Ambulatory Visit (HOSPITAL_COMMUNITY): Payer: Self-pay | Admitting: Cardiology

## 2018-06-05 DIAGNOSIS — H26492 Other secondary cataract, left eye: Secondary | ICD-10-CM | POA: Diagnosis not present

## 2018-06-05 DIAGNOSIS — H179 Unspecified corneal scar and opacity: Secondary | ICD-10-CM | POA: Diagnosis not present

## 2018-06-05 DIAGNOSIS — H353131 Nonexudative age-related macular degeneration, bilateral, early dry stage: Secondary | ICD-10-CM | POA: Diagnosis not present

## 2018-06-08 ENCOUNTER — Other Ambulatory Visit (HOSPITAL_COMMUNITY): Payer: Self-pay

## 2018-06-08 ENCOUNTER — Other Ambulatory Visit (HOSPITAL_COMMUNITY): Payer: Self-pay | Admitting: *Deleted

## 2018-06-08 MED ORDER — VALSARTAN 160 MG PO TABS: 80.0000 mg | ORAL_TABLET | Freq: Two times a day (BID) | ORAL | 1 refills | Status: DC

## 2018-06-10 ENCOUNTER — Ambulatory Visit (HOSPITAL_COMMUNITY)
Admission: RE | Admit: 2018-06-10 | Discharge: 2018-06-10 | Disposition: A | Discharge status: Home / Self Care | Payer: Medicare Other | Source: Ambulatory Visit | Attending: Cardiology | Admitting: Cardiology

## 2018-06-10 VITALS — BP 120/78 | HR 53 | Wt 191.0 lb

## 2018-06-10 DIAGNOSIS — E785 Hyperlipidemia, unspecified: Secondary | ICD-10-CM | POA: Insufficient documentation

## 2018-06-10 DIAGNOSIS — E039 Hypothyroidism, unspecified: Secondary | ICD-10-CM | POA: Diagnosis not present

## 2018-06-10 DIAGNOSIS — Z7902 Long term (current) use of antithrombotics/antiplatelets: Secondary | ICD-10-CM | POA: Diagnosis not present

## 2018-06-10 DIAGNOSIS — Z95810 Presence of automatic (implantable) cardiac defibrillator: Secondary | ICD-10-CM | POA: Insufficient documentation

## 2018-06-10 DIAGNOSIS — Z7982 Long term (current) use of aspirin: Secondary | ICD-10-CM | POA: Insufficient documentation

## 2018-06-10 DIAGNOSIS — F419 Anxiety disorder, unspecified: Secondary | ICD-10-CM | POA: Diagnosis not present

## 2018-06-10 DIAGNOSIS — Z7901 Long term (current) use of anticoagulants: Secondary | ICD-10-CM | POA: Insufficient documentation

## 2018-06-10 DIAGNOSIS — R05 Cough: Secondary | ICD-10-CM | POA: Diagnosis not present

## 2018-06-10 DIAGNOSIS — I252 Old myocardial infarction: Secondary | ICD-10-CM | POA: Diagnosis not present

## 2018-06-10 DIAGNOSIS — I4892 Unspecified atrial flutter: Secondary | ICD-10-CM | POA: Insufficient documentation

## 2018-06-10 DIAGNOSIS — I5022 Chronic systolic (congestive) heart failure: Secondary | ICD-10-CM | POA: Diagnosis not present

## 2018-06-10 DIAGNOSIS — I255 Ischemic cardiomyopathy: Secondary | ICD-10-CM | POA: Insufficient documentation

## 2018-06-10 DIAGNOSIS — I472 Ventricular tachycardia, unspecified: Secondary | ICD-10-CM

## 2018-06-10 DIAGNOSIS — I739 Peripheral vascular disease, unspecified: Secondary | ICD-10-CM

## 2018-06-10 DIAGNOSIS — N183 Chronic kidney disease, stage 3 (moderate): Secondary | ICD-10-CM | POA: Diagnosis not present

## 2018-06-10 DIAGNOSIS — I2510 Atherosclerotic heart disease of native coronary artery without angina pectoris: Secondary | ICD-10-CM | POA: Insufficient documentation

## 2018-06-10 DIAGNOSIS — I130 Hypertensive heart and chronic kidney disease with heart failure and stage 1 through stage 4 chronic kidney disease, or unspecified chronic kidney disease: Secondary | ICD-10-CM | POA: Diagnosis not present

## 2018-06-10 DIAGNOSIS — I509 Heart failure, unspecified: Secondary | ICD-10-CM | POA: Diagnosis present

## 2018-06-10 DIAGNOSIS — I714 Abdominal aortic aneurysm, without rupture: Secondary | ICD-10-CM | POA: Diagnosis not present

## 2018-06-10 DIAGNOSIS — Z87891 Personal history of nicotine dependence: Secondary | ICD-10-CM | POA: Diagnosis not present

## 2018-06-10 DIAGNOSIS — Z79899 Other long term (current) drug therapy: Secondary | ICD-10-CM | POA: Insufficient documentation

## 2018-06-10 DIAGNOSIS — M069 Rheumatoid arthritis, unspecified: Secondary | ICD-10-CM | POA: Insufficient documentation

## 2018-06-10 LAB — COMPREHENSIVE METABOLIC PANEL
ALBUMIN: 3.4 g/dL — AB (ref 3.5–5.0)
ALK PHOS: 86 U/L (ref 38–126)
ALT: 15 U/L (ref 0–44)
ANION GAP: 8 (ref 5–15)
AST: 17 U/L (ref 15–41)
BILIRUBIN TOTAL: 0.4 mg/dL (ref 0.3–1.2)
BUN: 25 mg/dL — ABNORMAL HIGH (ref 8–23)
CALCIUM: 8.4 mg/dL — AB (ref 8.9–10.3)
CO2: 25 mmol/L (ref 22–32)
Chloride: 108 mmol/L (ref 98–111)
Creatinine, Ser: 1.46 mg/dL — ABNORMAL HIGH (ref 0.61–1.24)
GFR calc Af Amer: 55 mL/min — ABNORMAL LOW (ref >60)
GFR calc non Af Amer: 47 mL/min — ABNORMAL LOW (ref >60)
GLUCOSE: 106 mg/dL — AB (ref 70–99)
Potassium: 4.2 mmol/L (ref 3.5–5.1)
Sodium: 141 mmol/L (ref 135–145)
TOTAL PROTEIN: 6.3 g/dL — AB (ref 6.5–8.1)

## 2018-06-10 LAB — T4, FREE: Free T4: 1.06 ng/dL (ref 0.82–1.77)

## 2018-06-10 LAB — LIPID PANEL
Cholesterol: 134 mg/dL (ref 0–200)
HDL: 34 mg/dL — ABNORMAL LOW (ref >40)
LDL CALC: 81 mg/dL (ref 0–99)
TRIGLYCERIDES: 95 mg/dL (ref <150)
Total CHOL/HDL Ratio: 3.9 RATIO
VLDL: 19 mg/dL (ref 0–40)

## 2018-06-10 LAB — TSH: TSH: 3.999 u[IU]/mL (ref 0.350–4.500)

## 2018-06-10 MED ORDER — EPLERENONE 25 MG PO TABS: 12.5000 mg | ORAL_TABLET | Freq: Every day | ORAL | 1 refills | Status: DC

## 2018-06-10 NOTE — Patient Instructions (Addendum)
RESTART Eplerenone 12.5mg  daily.  Lab work today. We will call if results are ABNORMAL.  Follow up in 3 months.  Please have labs drawn in 10 days and in 1 month

## 2018-06-10 NOTE — Progress Notes (Signed)
Patient ID: Channin Agustin, male   DOB: 04/21/1946, 73 y.o.   MRN: [id number]     Advanced Heart Failure Clinic Note   PCP: Dr. Helene Kelp Cardiology: Dr. Aundra Dubin  73 y.o. with history of CAD, PAD, ischemic CMP, and VT s/p ICD placement returns for followup of CHF.  Echo in 2/17 showed EF 40% and regional WMAs.  In 3/18, he developed episodes of VT as well as increased dyspnea.  LHC/RHC was done, showing new occlusion of a large ramus (CTO, probably a month or so old at time of cath) and old CTO RCA with collaterals.  He had DES x 3 to ramus.  Filling pressures were optimized on RHC.  Amiodarone was increased to 200 mg daily from 100 mg daily.  No VT since PCI.   Echo in 6/19 showed EF 35-40% with wall motion abnormalities.   He is off eplerenone with recent hyperkalemia, though highest reading seems like it may have been hemolyzed.   Weight is up 3 lbs.  He feels about the same, mild dyspnea walking up a hill but does ok on flat ground.  Not very active.  No chest pain. No orthopnea/PND. He stopped Zetia because his GI MD thought that it might be causing his constipation, and his bowel motility seemed to improve off it.     Labs (11/10): K 4.9, creatinine 1.4, LDL 70, HDL 31, LFTs normal Labs (3/11): K 4.4, creatinine 1.3, LDL 61, HDL 35 Labs (7/11): LDL 62, HDL 23 Labs (1/12): K 4.8, LFTs normal, creatinine 1.44, LDL 69, HDL 33 Labs (7/12): LFTs normal, LDL 81, HDL 36, HCT normal, K 4.3, creatinine 1.3, BNP 116, TSH normal Labs (1/13): LDL 39, HDl 31, LFTs normal, TSH  Labs (7/13): TSH normal, LFTs normal, LDL 69, HDL 40 Labs (8/13): K 4.5, creatinine 1.6 Labs (10/13): K 4.4, creatinine 1.4 Labs (3/14): LDL 108, HDL 30, LFTs normal Labs (9/14): K 4.2, creatinine 1.6, LFTs normal, LDL 114, TSH 10.6 (elevated), free T4/free T3 normal Labs (2/15): K 4.6, creatinine 1.5, LFTs normal, TSH 7.6 (elevated), free T4 and free T3 normal Labs (3/15): K 3.6, creatinine 1.5, LFTs normal, TSH  normal Labs (9/15): K 4.3, creatinine 1.6, LFTs normal, TSH normal, LDL 115, HDL 24 Labs (10/16): K 4.5, creatinine 1.5, LDL 90, HDL 35, hgb 12.8 Labs (11/16): Low free T3, normal TSH and free T4 Labs (12/16): K 5, creatinine 1.8, LFTs normal, TSH 8.6 (mildly elevated) Labs (1/17): Free T4/TSH/free T3 normal, LFTs normal Labs (2/17): K 4.4, creatinine 2.4, LDL 131, HDL 34 Labs (4/17): K 4.2, creatinine 1.3, LDL 67, HDL 35 Labs (9/17): K 4.9, creatinine 2.2, LDL 54, HDL 35, free T3 and T4 normal, hgb 12 Labs (4/18): K 5, creatinine 1.78, LFTs normal, TSH elevated but free T3 and free T4 normal.  Labs (6/18): LDL 52, HDL 35, TSH elevated 6.8, LFTs normal Labs (7/18): K 4.3, creatinine 1.68 Labs (10/18): TSH 5.6, free T3 normal, free T4 normal, LFTs normal, K 4.9, creatinine 1.4 Labs (11/18): K 4.5, creatinine 1.46, LFTs normal Labs (4/19): K 4.8, creatinine 1.6, LFTs normal Labs (6/19): TSH slightly elevated, free T3/T4 both normal, LFTs normal Labs (7/19): K 4.3, creatinine 1.3 Labs (10/19): LFTs normal, LDL 58, TGs 165, TSH 6.6 but free T4 and free T3 normal Labs (1/20): K 4.7, creatinine 1.7  Allergies (verified):  No Known Drug Allergies  Past Medical History: 1. Coronary artery disease. The patient reports history of silent MI in 1993.  This was  likely an inferior MI (see below).  - The patient presented to [facility] in 5/10 with VT and mildly elevated cardiac enzymes. LHC (5/10):  Inferobasal dyskinesis with EF 35-40%.  There was chronic total occlusion of the mid RCA with good collaterals.  Luminals LCA.  This did not appear to be an acute cause of the 5/10 event.  - LHC (3/18): Known occlusion of RCA with collaterals, new occlusion of ramus.  DES x 3 to ramus.  2. Hypertension.  3. Hyperlipidemia: Intolerant of higher doses of simvastatin, Crestor, Lipitor, pravastatin. Zetia caused constipation.  4. Remote tobacco abuse with 47-pack-year history, quitting in August 2009.  5.  Peripheral arterial disease.  - Status post left-to-right fem-fem bypass performed in  in March 2009.  - Status post redo left-to-right fem-fem bypass performed by Dr. Scot Dock at [facility] in 2009.  - ABIs normal 3/15, ABIs normal 3/16, ABIs normal 3/17, ABIs normal 4/18, ABIs normal 4/19.  6. Rheumatoid arthritis, on leflunomide.  7.  Ischemic cardiomyopathy:  EF 35-40% by LV-gram 5/10 with inferobasal dyskinesis.  Echo 5/10 showed EF 40% with mild LVH, no significant MR, inferobasal and posterobasal akinesis.  Echo (7/11): EF 50%, mild LVH, basal-mid inferoposterior akinesis.  Echo (7/12): EF 45-50% with basal anterolateral, basal posterior, and basal to mid inferior akinesis.  Echo (4/16): EF 40-45%, basal to mid inferolateral AK, basal inferior AK, basal to mid anterolateral HK.  Echo (2/17): EF 40% with basal to mid inferior akinesis, basal inferolateral aneurysm, mid inferolateral akinesis, basal anterolateral hypokinesis, normal RV size and systolic function, PASP 25 mmHg.  - Echo (3/18) with EF 30-35%, moderate LV dilation, moderate MR, mildly decreased RV systolic function, PASP 51 mmmHg. - RHC (3/18): mean RA 4, PA 38/12, mean PCWP 17, CI 2.2.  - Echo (6/19): EF 35-40%, inferior/inferolateral/anterolateral WMAs, moderate MR likely infarct-related, normal RV size with mildly decreased systolic function.  8.  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  He has a dual chamber Medtronic ICD.  9.  Atrial flutter:  Status post isthmus ablation 5/10.   10.  PFTs (7/11): FVC 74%, FEV1 80%, ratio 75%, TLC 78%, DLCO 68%.  This suggests a mild restrictive defect and a mild obstructive defect.  He did have response to bronchodilator. These PFTs were significantly better than the report from Dr. Alcide Clever in [place] done prior.  He had last PFTs in 2/12 with no significant change.  11.  Anxiety 12.  Chronic cough: No relief with change from ACEI  to ARB or with trial of PPI.  13.  CKD: Stage 3.  14. Mitral regurgitation: Moderate on 6/19 echo, suspect infarct-related.  15. AAA: 3.0 cm AAA on abdominal US 4/19.   Family History: Mother died in her late 24s with gastric cancer.  Father died in his late 41s with throat cancer and PVD.   He had a brother who died at 5 of an MI.   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 -47ppy hx, quit 2009.  Alcohol Use - yes-minimal Regular Exercise - yes Drug Use - no (prior)  Review of Systems        All systems reviewed and negative except as per HPI.   Current Outpatient Medications  Medication Sig Dispense Refill  . amiodarone (PACERONE) 200 MG tablet Take 1 tablet (200 mg total) by mouth daily. 90 tablet 3  . aspirin EC 81 MG tablet  Take 1 tablet (81 mg total) by mouth daily.    . carvedilol (COREG) 25 MG tablet TAKE 1 TABLET (25 MG TOTAL) BY MOUTH 2 (TWO) TIMES DAILY WITH A MEAL. 180 tablet 2  . chlorhexidine (PERIDEX) 0.12 % solution Swish and rinse as needed for dental  3  . clopidogrel (PLAVIX) 75 MG tablet TAKE 1 TABLET (75 MG TOTAL) BY MOUTH DAILY WITH BREAKFAST. 90 tablet 2  . fluticasone (FLONASE) 50 MCG/ACT nasal spray Place 1-2 sprays into both nostrils daily as needed for allergies or rhinitis.    . furosemide (LASIX) 40 MG tablet Take 1 tablet (40 mg total) by mouth daily. 90 tablet 2  . leflunomide (ARAVA) 20 MG tablet Take 20 mg by mouth daily.      . Melatonin 10 MG TABS Take 10 mg by mouth at bedtime as needed (sleep).     . simvastatin (ZOCOR) 20 MG tablet TAKE 1 TABLET (20 MG TOTAL) BY MOUTH AT BEDTIME. 90 tablet 3  . valsartan (DIOVAN) 160 MG tablet Take 0.5 tablets (80 mg total) by mouth 2 (two) times daily. Take 0.5 tab (80 mg) by mouth twice daily. 90 tablet 1  . eplerenone (INSPRA) 25 MG tablet Take 0.5 tablets (12.5 mg total) by mouth daily. 45 tablet 1   No current facility-administered  medications for this encounter.     Vitals:   06/10/18 0846  BP: 120/78  Pulse: (!) 53  SpO2: 96%  Weight: 86.6 kg (191 lb)   Wt Readings from Last 3 Encounters:  06/10/18 86.6 kg (191 lb)  02/16/18 85.5 kg (188 lb 6.4 oz)  02/16/18 85.3 kg (188 lb)    General: NAD Neck: No JVD, no thyromegaly or thyroid nodule.  Lungs: Clear to auscultation bilaterally with normal respiratory effort. CV: Nondisplaced PMI.  Heart regular S1/S2, no S3/S4, no murmur.  No peripheral edema.  No carotid bruit.  Normal pedal pulses.  Abdomen: Soft, nontender, no hepatosplenomegaly, no distention.  Skin: Intact without lesions or rashes.  Neurologic: Alert and oriented x 3.  Psych: Normal affect. Extremities: No clubbing or cyanosis.  HEENT: Normal. =  Assessment/Plan:  1. Chronic systolic CHF Ischemic cardiomyopathy.  Medtronic ICD.  Echo (6/19) with EF 35-40%. NYHA class II symptoms.  Weight is mildly elevated but he is euvolemic on exam.  - Continue Coreg 25 mg bid.  - We have discussed transitioning to [facility], he wants to stay on valsartan (says Delene Loll would send him into donut hole too fast).   I will not increase valsartan today (on 80 mg bid) with creatinine 1.7 when most recently checked.  - Restart eplerenone at low dose, 12.5 mg daily and follow K closely. Will get BMET today and again in 10 days.  - Continue Lasix 40 mg daily.  2. CAD Denies chest pain.  Had DES x 3 to ramus in 3/18.   - Continue ASA 81 mg daily, clopidogrel, Zocor 20 mg daily, and Coreg/ARB as above.   3. HLD  Stable simvastatin without myalgias.  Has not been able to tolerate higher dose of simvastatin or other statins.  Unable to tolerate Zetia due to constipation.   - Check lipids today.  If LDL > 70, will have him seen in lipid clinic to see if he can get Repatha.  4. Hx of VT.   Continue amiodarone 200 mg daily (increased from 100 mg daily after last VT event). Has ICD.  - Subclinical hypothyroidism (has had  mildly elevated TSH with  normal free T3 and free T4).  Repeat TSH/free T3/free T4 today.  If TSH rises significantly, probably needs treatment with low dose Levoxyl.  - Check LFTs today.  - Knows to get a yearly eye exam 5. PAD He denies claudication.  Normal ABIs in 4/19, followed by [facility].   6. AAA 3.0 cm in 4/19, followed by [facility].  7. CKD Stage 3.  Follow BMET closely.   Followup in 3 months  Loralie Champagne 06/10/2018

## 2018-06-11 LAB — T3, FREE: T3 FREE: 2.1 pg/mL (ref 2.0–4.4)

## 2018-06-12 ENCOUNTER — Encounter (HOSPITAL_COMMUNITY): Payer: Medicare Other | Admitting: Cardiology

## 2018-06-16 ENCOUNTER — Other Ambulatory Visit (HOSPITAL_COMMUNITY): Payer: Self-pay

## 2018-06-16 ENCOUNTER — Telehealth (HOSPITAL_COMMUNITY): Payer: Self-pay

## 2018-06-16 NOTE — Telephone Encounter (Signed)
Spoke with pt who did not want to visit lipid clinic or begin repatha due to cost, stated I believed it would be covered financially. Pt states he does not want to travel to [place] that often or have injections that frequently, Pt would rather increase statin. Pt seems amenable if there was a clinic closer to his home and would be assured financial coverage. Told pt I would relay this information to MD Aundra Dubin and call him back.

## 2018-06-16 NOTE — Telephone Encounter (Signed)
-----   Message from Larey Dresser, MD sent at 06/10/2018  9:23 PM EST ----- LDL goal < 70. If he is amenable, would refer to lipid clinic to look into Repatha for him.  K is ok and creatinine better. TSH normal now.

## 2018-06-17 ENCOUNTER — Telehealth (HOSPITAL_COMMUNITY): Payer: Self-pay | Admitting: *Deleted

## 2018-06-17 DIAGNOSIS — I5022 Chronic systolic (congestive) heart failure: Secondary | ICD-10-CM | POA: Diagnosis not present

## 2018-06-17 NOTE — Telephone Encounter (Signed)
Order for lipid panel mailed to pts home address.

## 2018-06-25 ENCOUNTER — Telehealth (HOSPITAL_COMMUNITY): Payer: Self-pay | Admitting: *Deleted

## 2018-06-25 NOTE — Telephone Encounter (Signed)
Per Dr Aundra Dubin, pt needs order for a lipid panel mailed to his home so he can have them drawn at [facility].  Order written and mailed to pt.

## 2018-06-26 ENCOUNTER — Other Ambulatory Visit (HOSPITAL_COMMUNITY): Payer: Self-pay | Admitting: Cardiology

## 2018-07-08 DIAGNOSIS — I5022 Chronic systolic (congestive) heart failure: Secondary | ICD-10-CM | POA: Diagnosis not present

## 2018-08-04 ENCOUNTER — Telehealth: Payer: Self-pay | Admitting: Internal Medicine

## 2018-08-04 NOTE — Telephone Encounter (Signed)
New Message   Pt is calling because he said he got a letter in the mail from [employer]. He has questions about it and would like an Scientist, physiological or someone who can answer his questions to call him back  Please call

## 2018-08-05 NOTE — Telephone Encounter (Signed)
Spent 40 min on phone w/pt, we discussed [facility] and I helped him navigate their website.  We also discussed Tele-health visits and I helped walk him through that process as well and even did a test visit with him through webex so that he is more comfortable.  He states he would love to do this kind of visit so he does not have to drive up from [place].  Pt was very thankful for the call and the information.

## 2018-08-17 ENCOUNTER — Ambulatory Visit (INDEPENDENT_AMBULATORY_CARE_PROVIDER_SITE_OTHER): Payer: Medicare Other | Admitting: *Deleted

## 2018-08-17 ENCOUNTER — Other Ambulatory Visit: Payer: Self-pay

## 2018-08-17 DIAGNOSIS — I472 Ventricular tachycardia, unspecified: Secondary | ICD-10-CM

## 2018-08-17 DIAGNOSIS — I5022 Chronic systolic (congestive) heart failure: Secondary | ICD-10-CM

## 2018-08-17 LAB — CUP PACEART REMOTE DEVICE CHECK
Battery Remaining Longevity: 111 mo
Battery Voltage: 3.01 V
Brady Statistic AP VP Percent: 0.12 %
Brady Statistic AP VS Percent: 24.79 %
Brady Statistic AS VP Percent: 0.65 %
Brady Statistic AS VS Percent: 74.44 %
Brady Statistic RA Percent Paced: 24.88 %
Brady Statistic RV Percent Paced: 0.82 %
Date Time Interrogation Session: 20200413041601
HighPow Impedance: 38 Ohm
HighPow Impedance: 50 Ohm
Implantable Lead Implant Date: 20100520
Implantable Lead Implant Date: 20100520
Implantable Lead Location: 753859
Implantable Lead Location: 753860
Implantable Lead Model: 5076
Implantable Lead Model: 6947
Implantable Pulse Generator Implant Date: 20180710
Lead Channel Impedance Value: 266 Ohm
Lead Channel Impedance Value: 323 Ohm
Lead Channel Impedance Value: 380 Ohm
Lead Channel Pacing Threshold Amplitude: 0.625 V
Lead Channel Pacing Threshold Amplitude: 1 V
Lead Channel Pacing Threshold Pulse Width: 0.4 ms
Lead Channel Pacing Threshold Pulse Width: 0.4 ms
Lead Channel Sensing Intrinsic Amplitude: 1 mV
Lead Channel Sensing Intrinsic Amplitude: 1 mV
Lead Channel Sensing Intrinsic Amplitude: 8.625 mV
Lead Channel Sensing Intrinsic Amplitude: 8.625 mV
Lead Channel Setting Pacing Amplitude: 1.5 V
Lead Channel Setting Pacing Amplitude: 3 V
Lead Channel Setting Pacing Pulse Width: 0.4 ms
Lead Channel Setting Sensing Sensitivity: 0.3 mV

## 2018-08-26 ENCOUNTER — Encounter (HOSPITAL_COMMUNITY): Payer: Self-pay

## 2018-08-28 NOTE — Progress Notes (Signed)
Remote ICD transmission.   

## 2018-09-01 ENCOUNTER — Encounter (HOSPITAL_COMMUNITY): Payer: Self-pay

## 2018-09-03 DIAGNOSIS — M0579 Rheumatoid arthritis with rheumatoid factor of multiple sites without organ or systems involvement: Secondary | ICD-10-CM | POA: Diagnosis not present

## 2018-09-03 DIAGNOSIS — E785 Hyperlipidemia, unspecified: Secondary | ICD-10-CM | POA: Diagnosis not present

## 2018-09-03 DIAGNOSIS — I255 Ischemic cardiomyopathy: Secondary | ICD-10-CM | POA: Diagnosis not present

## 2018-09-03 DIAGNOSIS — Z79899 Other long term (current) drug therapy: Secondary | ICD-10-CM | POA: Diagnosis not present

## 2018-09-08 ENCOUNTER — Encounter (HOSPITAL_COMMUNITY): Payer: Medicare Other | Admitting: Cardiology

## 2018-09-08 ENCOUNTER — Ambulatory Visit (HOSPITAL_COMMUNITY)
Admission: RE | Admit: 2018-09-08 | Discharge: 2018-09-08 | Disposition: A | Discharge status: Home / Self Care | Payer: Medicare Other | Source: Ambulatory Visit | Attending: Cardiology | Admitting: Cardiology

## 2018-09-08 ENCOUNTER — Other Ambulatory Visit: Payer: Self-pay

## 2018-09-08 ENCOUNTER — Encounter (HOSPITAL_COMMUNITY): Payer: Self-pay

## 2018-09-08 DIAGNOSIS — I5022 Chronic systolic (congestive) heart failure: Secondary | ICD-10-CM

## 2018-09-08 MED ORDER — EPLERENONE 25 MG PO TABS: 25.0000 mg | ORAL_TABLET | Freq: Every day | ORAL | 3 refills | Status: DC

## 2018-09-08 NOTE — Progress Notes (Signed)
Heart Failure TeleHealth Note  Due to national recommendations of social distancing due to [place] 19, Audio/video telehealth visit is felt to be most appropriate for this patient at this time.  See MyChart message from today for patient consent regarding telehealth for [facility].  Date:  09/08/2018   ID:  Nathan Pruitt, DOB 11-Nov-1945, MRN [id number]  Location: Home  Provider location: [place] [facility] Type of Visit: Established patient  PCP:  Ronita Hipps, MD  Cardiologist: Dr. Aundra Dubin  Chief Complaint: Shortness of breath   History of Present Illness: Nathan Pruitt is a 73 y.o. male who presents via audio/video conferencing for a telehealth visit today.     he denies symptoms worrisome for COVID 19.   Patient has a history of CAD, PAD, ischemic CMP, and VT s/p ICD.  Echo in 2/17 showed EF 40% and regional WMAs.  In 3/18, he developed episodes of VT as well as increased dyspnea.  LHC/RHC was done, showing new occlusion of a large ramus (CTO, probably a month or so old at time of cath) and old CTO RCA with collaterals.  He had DES x 3 to ramus.  Filling pressures were optimized on RHC.  Amiodarone was increased to 200 mg daily from 100 mg daily.  No VT since PCI.   Echo in 6/19 showed EF 35-40% with wall motion abnormalities.   He has been doing reasonably well recently.  K tolerated restarting eplerenone.  Mild dyspnea walking up a hill but ok on flat ground.  Not very active.  Not getting out much at all now because of coronavirus.  No orthopnea/PND.    Labs (11/10): K 4.9, creatinine 1.4, LDL 70, HDL 31, LFTs normal Labs (3/11): K 4.4, creatinine 1.3, LDL 61, HDL 35 Labs (7/11): LDL 62, HDL 23 Labs (1/12): K 4.8, LFTs normal, creatinine 1.44, LDL 69, HDL 33 Labs (7/12): LFTs normal, LDL 81, HDL 36, HCT normal, K 4.3, creatinine 1.3, BNP 116, TSH normal Labs (1/13): LDL 39, HDl 31, LFTs normal, TSH  Labs (7/13): TSH normal, LFTs normal, LDL  69, HDL 40 Labs (8/13): K 4.5, creatinine 1.6 Labs (10/13): K 4.4, creatinine 1.4 Labs (3/14): LDL 108, HDL 30, LFTs normal Labs (9/14): K 4.2, creatinine 1.6, LFTs normal, LDL 114, TSH 10.6 (elevated), free T4/free T3 normal Labs (2/15): K 4.6, creatinine 1.5, LFTs normal, TSH 7.6 (elevated), free T4 and free T3 normal Labs (3/15): K 3.6, creatinine 1.5, LFTs normal, TSH normal Labs (9/15): K 4.3, creatinine 1.6, LFTs normal, TSH normal, LDL 115, HDL 24 Labs (10/16): K 4.5, creatinine 1.5, LDL 90, HDL 35, hgb 12.8 Labs (11/16): Low free T3, normal TSH and free T4 Labs (12/16): K 5, creatinine 1.8, LFTs normal, TSH 8.6 (mildly elevated) Labs (1/17): Free T4/TSH/free T3 normal, LFTs normal Labs (2/17): K 4.4, creatinine 2.4, LDL 131, HDL 34 Labs (4/17): K 4.2, creatinine 1.3, LDL 67, HDL 35 Labs (9/17): K 4.9, creatinine 2.2, LDL 54, HDL 35, free T3 and T4 normal, hgb 12 Labs (4/18): K 5, creatinine 1.78, LFTs normal, TSH elevated but free T3 and free T4 normal.  Labs (6/18): LDL 52, HDL 35, TSH elevated 6.8, LFTs normal Labs (7/18): K 4.3, creatinine 1.68 Labs (10/18): TSH 5.6, free T3 normal, free T4 normal, LFTs normal, K 4.9, creatinine 1.4 Labs (11/18): K 4.5, creatinine 1.46, LFTs normal Labs (4/19): K 4.8, creatinine 1.6, LFTs normal Labs (6/19): TSH slightly elevated, free T3/T4 both normal, LFTs normal  Labs (7/19): K 4.3, creatinine 1.3 Labs (10/19): LFTs normal, LDL 58, TGs 165, TSH 6.6 but free T4 and free T3 normal Labs (1/20): K 4.7, creatinine 1.7 Labs (2/20): LFTs normal Labs (4/20): K 4.6, creatinine 1.4, LDL 77, HDL 36, hgb 12.2, TSH mild elevation 7.4, free T4 and free T3 normal.   Allergies (verified):  No Known Drug Allergies  Past Medical History: 1. Coronary artery disease. The patient reports history of silent MI in 1993.  This was likely an inferior MI (see below).  - The patient presented to [facility] in 5/10 with VT and mildly elevated cardiac enzymes. LHC  (5/10):  Inferobasal dyskinesis with EF 35-40%.  There was chronic total occlusion of the mid RCA with good collaterals.  Luminals LCA.  This did not appear to be an acute cause of the 5/10 event.  - LHC (3/18): Known occlusion of RCA with collaterals, new occlusion of ramus.  DES x 3 to ramus.  2. Hypertension.  3. Hyperlipidemia: Intolerant of higher doses of simvastatin, Crestor, Lipitor, pravastatin. Zetia caused constipation.  4. Remote tobacco abuse with 47-pack-year history, quitting in August 2009.  5. Peripheral arterial disease.  - Status post left-to-right fem-fem bypass performed in [place] in March 2009.  - Status post redo left-to-right fem-fem bypass performed by Dr. Scot Dock at [facility] in 2009.  - ABIs normal 3/15, ABIs normal 3/16, ABIs normal 3/17, ABIs normal 4/18, ABIs normal 4/19.  6. Rheumatoid arthritis, on leflunomide.  7.  Ischemic cardiomyopathy:  EF 35-40% by LV-gram 5/10 with inferobasal dyskinesis.  Echo 5/10 showed EF 40% with mild LVH, no significant MR, inferobasal and posterobasal akinesis.  Echo (7/11): EF 50%, mild LVH, basal-mid inferoposterior akinesis.  Echo (7/12): EF 45-50% with basal anterolateral, basal posterior, and basal to mid inferior akinesis.  Echo (4/16): EF 40-45%, basal to mid inferolateral AK, basal inferior AK, basal to mid anterolateral HK.  Echo (2/17): EF 40% with basal to mid inferior akinesis, basal inferolateral aneurysm, mid inferolateral akinesis, basal anterolateral hypokinesis, normal RV size and systolic function, PASP 25 mmHg.  - Echo (3/18) with EF 30-35%, moderate LV dilation, moderate MR, mildly decreased RV systolic function, PASP 51 mmmHg. - RHC (3/18): mean RA 4, PA 38/12, mean PCWP 17, CI 2.2.  - Echo (6/19): EF 35-40%, inferior/inferolateral/anterolateral WMAs, moderate MR likely infarct-related, normal RV size with mildly decreased systolic function.  8.  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  VT storm 5/10  suppressed by amiodarone and Coreg.  He has a dual chamber Medtronic ICD.  9.  Atrial flutter:  Status post isthmus ablation 5/10.   10.  PFTs (7/11): FVC 74%, FEV1 80%, ratio 75%, TLC 78%, DLCO 68%.  This suggests a mild restrictive defect and a mild obstructive defect.  He did have response to bronchodilator. These PFTs were significantly better than the report from Dr. Alcide Clever in [place] done prior.  He had last PFTs in 2/12 with no significant change.  11.  Anxiety 12.  Chronic cough: No relief with change from ACEI to ARB or with trial of PPI.  13.  CKD: Stage 3.  14. Mitral regurgitation: Moderate on 6/19 echo, suspect infarct-related.  15. AAA: 3.0 cm AAA on abdominal US 4/19.   Current Outpatient Medications  Medication Sig Dispense Refill   amiodarone (PACERONE) 200 MG tablet Take 1 tablet (200 mg total) by mouth daily. 90 tablet 3   aspirin EC 81 MG tablet Take 1 tablet (81 mg total) by mouth daily.  carvedilol (COREG) 25 MG tablet TAKE 1 TABLET (25 MG TOTAL) BY MOUTH 2 (TWO) TIMES DAILY WITH A MEAL. 180 tablet 2   chlorhexidine (PERIDEX) 0.12 % solution Swish and rinse as needed for dental  3   clopidogrel (PLAVIX) 75 MG tablet TAKE 1 TABLET (75 MG TOTAL) BY MOUTH DAILY WITH BREAKFAST. 90 tablet 2   eplerenone (INSPRA) 25 MG tablet Take 1 tablet (25 mg total) by mouth daily. 90 tablet 3   fluticasone (FLONASE) 50 MCG/ACT nasal spray Place 1-2 sprays into both nostrils daily as needed for allergies or rhinitis.     furosemide (LASIX) 40 MG tablet Take 1 tablet (40 mg total) by mouth daily. 90 tablet 2   leflunomide (ARAVA) 20 MG tablet Take 20 mg by mouth daily.       Melatonin 10 MG TABS Take 10 mg by mouth at bedtime as needed (sleep).      simvastatin (ZOCOR) 20 MG tablet TAKE 1 TABLET (20 MG TOTAL) BY MOUTH AT BEDTIME. 90 tablet 3   valsartan (DIOVAN) 160 MG tablet Take 0.5 tablets (80 mg total) by mouth 2 (two) times daily. Take 0.5 tab (80 mg) by mouth twice  daily. 90 tablet 1   No current facility-administered medications for this encounter.     Allergies:   Atorvastatin; Crestor [rosuvastatin calcium]; Losartan; Telmisartan; Enalapril; and Lisinopril   Social History:  The patient  reports that he quit smoking about 11 years ago. He has a 47.00 pack-year smoking history. He has never used smokeless tobacco. He reports current alcohol use. He reports that he does not use drugs.   Family History:  The patient's family history includes Gastric cancer in his mother; Heart attack (age of onset: 20) in his brother; Peripheral vascular disease in his father; Throat cancer in his father.   ROS:  Please see the history of present illness.   All other systems are personally reviewed and negative.   Exam:  (Video/Tele Health Call; Exam is subjective and or/visual.) General:  Speaks in full sentences. No resp difficulty. Neck: No JVD.  Lungs: Normal respiratory effort with conversation.  Abdomen: Non-distended per patient report Extremities: Pt denies edema. Neuro: Alert & oriented x 3.   Recent Labs: 02/16/2018: Hemoglobin 12.3; Platelets 264 06/10/2018: ALT 15; BUN 25; Creatinine, Ser 1.46; Potassium 4.2; Sodium 141; TSH 3.999  Personally reviewed   Wt Readings from Last 3 Encounters:  06/10/18 86.6 kg (191 lb)  02/16/18 85.5 kg (188 lb 6.4 oz)  02/16/18 85.3 kg (188 lb)     ASSESSMENT AND PLAN:  1. Chronic systolic CHF: Ischemic cardiomyopathy.  Medtronic ICD.  Echo (6/19) with EF 35-40%. NYHA class II symptoms. He is not volume overloaded.   - Continue Coreg 25 mg bid.  - We have discussed transitioning to [facility], he wants to stay on valsartan (says Delene Loll would send him into donut hole too fast).  Continue valsartan at 80 mg bid. Creatinine 1.4 when recently checked.   - K 4.6 on eplerenone 12.5 mg daily.  Increase eplerenone to 25 mg daily, repeat BMET in 2 wks.  Follow low K diet.   - Continue Lasix 40 mg daily.  2. CAD: Denies  chest pain.  Had DES x 3 to ramus in 3/18.   - Continue ASA 81 mg daily, clopidogrel, Zocor 20 mg daily, and Coreg/ARB as above.   3. HLD: Stable on simvastatin without myalgias.  Has not been able to tolerate higher dose of simvastatin or other  statins. Unable to tolerate Zetia due to constipation.  Recent LDL was 78, which is adequate.  He has not wanted to go on Repatha.   4. Hx of VT: Continue amiodarone 200 mg daily (increased from 100 mg daily after last VT event). Has ICD.  - Subclinical hypothyroidism (has had mildly elevated TSH with normal free T3 and free T4).  Follow TSH, if further rise may need low dose Levoxyl. - Normal LFTs in 2/20.  - Knows to get a yearly eye exam 5. PAD: He denies claudication.  Normal ABIs in 4/19, followed by [facility].   6. AAA: 3.0 cm in 4/19, followed by [facility].  7. CKD: Stage 3.   - BMET in 2 wks with increased eplerenone.   COVID screen The patient does not have any symptoms that suggest any further testing/ screening at this time.  Social distancing reinforced today.  Patient Risk: After full review of this patients clinical status, I feel that they are at moderate risk for cardiac decompensation at this time.  Relevant cardiac medications were reviewed at length with the patient today. The patient does not have concerns regarding their medications at this time.   Recommended follow-up:  3 months, can do telehealth.   Today, I have spent 21 minutes with the patient with telehealth technology discussing the above issues .    Signed, Loralie Champagne, MD  09/08/2018 4:59 PM  [place] [address] Heart and [place] [address] [phone] (office) [phone] (fax)

## 2018-09-08 NOTE — Patient Instructions (Addendum)
Increase Eplerenone to 25mg  daily. Sent in as a 90 day supply.  Lab work needs to be done in 2 weeks. Dr. Aundra Dubin wants for you to have a BMET drawn only. Prescription faxed in to [facility].  Please follow up with Dr. Aundra Dubin in 3 months. This is scheduled for August 6th at 9:00am. This will be a telehealth virtual visit.

## 2018-09-08 NOTE — Progress Notes (Addendum)
Spoke to pt to review after visit summary. Pt had concern that Dr. Aundra Dubin wanted different lab than just BMET. Per Dr. Aundra Dubin the pt needs to have a BMET drawn in 2 weeks. Telehealth visit scheduled for pt. No further questions. avs sent through mychart.

## 2018-09-12 ENCOUNTER — Encounter (HOSPITAL_COMMUNITY): Payer: Self-pay

## 2018-09-14 ENCOUNTER — Other Ambulatory Visit (HOSPITAL_COMMUNITY): Payer: Self-pay | Admitting: Cardiology

## 2018-09-14 MED ORDER — VALSARTAN 80 MG PO TABS: 80.0000 mg | ORAL_TABLET | Freq: Two times a day (BID) | ORAL | 3 refills | Status: DC

## 2018-09-18 DIAGNOSIS — I472 Ventricular tachycardia: Secondary | ICD-10-CM | POA: Diagnosis not present

## 2018-09-18 DIAGNOSIS — E785 Hyperlipidemia, unspecified: Secondary | ICD-10-CM | POA: Diagnosis not present

## 2018-09-18 DIAGNOSIS — I5020 Unspecified systolic (congestive) heart failure: Secondary | ICD-10-CM | POA: Diagnosis not present

## 2018-10-02 ENCOUNTER — Other Ambulatory Visit (HOSPITAL_COMMUNITY): Payer: Self-pay | Admitting: Cardiology

## 2018-10-02 DIAGNOSIS — I472 Ventricular tachycardia, unspecified: Secondary | ICD-10-CM

## 2018-10-03 DIAGNOSIS — E785 Hyperlipidemia, unspecified: Secondary | ICD-10-CM | POA: Diagnosis not present

## 2018-10-03 DIAGNOSIS — I10 Essential (primary) hypertension: Secondary | ICD-10-CM | POA: Diagnosis not present

## 2018-10-24 ENCOUNTER — Other Ambulatory Visit: Payer: Self-pay | Admitting: Internal Medicine

## 2018-11-11 DIAGNOSIS — D2239 Melanocytic nevi of other parts of face: Secondary | ICD-10-CM | POA: Diagnosis not present

## 2018-11-11 DIAGNOSIS — L821 Other seborrheic keratosis: Secondary | ICD-10-CM | POA: Diagnosis not present

## 2018-11-11 DIAGNOSIS — L570 Actinic keratosis: Secondary | ICD-10-CM | POA: Diagnosis not present

## 2018-11-11 DIAGNOSIS — D225 Melanocytic nevi of trunk: Secondary | ICD-10-CM | POA: Diagnosis not present

## 2018-11-11 DIAGNOSIS — L219 Seborrheic dermatitis, unspecified: Secondary | ICD-10-CM | POA: Diagnosis not present

## 2018-11-16 ENCOUNTER — Ambulatory Visit (INDEPENDENT_AMBULATORY_CARE_PROVIDER_SITE_OTHER): Payer: Medicare Other | Admitting: *Deleted

## 2018-11-16 DIAGNOSIS — I472 Ventricular tachycardia, unspecified: Secondary | ICD-10-CM

## 2018-11-16 DIAGNOSIS — I5022 Chronic systolic (congestive) heart failure: Secondary | ICD-10-CM

## 2018-11-16 LAB — CUP PACEART REMOTE DEVICE CHECK
Battery Remaining Longevity: 109 mo
Battery Voltage: 3.01 V
Brady Statistic AP VP Percent: 0.1 %
Brady Statistic AP VS Percent: 19.61 %
Brady Statistic AS VP Percent: 0.37 %
Brady Statistic AS VS Percent: 79.92 %
Brady Statistic RA Percent Paced: 19.57 %
Brady Statistic RV Percent Paced: 0.51 %
Date Time Interrogation Session: 20200713084223
HighPow Impedance: 42 Ohm
HighPow Impedance: 52 Ohm
Implantable Lead Implant Date: 20100520
Implantable Lead Implant Date: 20100520
Implantable Lead Location: 753859
Implantable Lead Location: 753860
Implantable Lead Model: 5076
Implantable Lead Model: 6947
Implantable Pulse Generator Implant Date: 20180710
Lead Channel Impedance Value: 304 Ohm
Lead Channel Impedance Value: 342 Ohm
Lead Channel Impedance Value: 399 Ohm
Lead Channel Pacing Threshold Amplitude: 0.5 V
Lead Channel Pacing Threshold Amplitude: 1.5 V
Lead Channel Pacing Threshold Pulse Width: 0.4 ms
Lead Channel Pacing Threshold Pulse Width: 0.4 ms
Lead Channel Sensing Intrinsic Amplitude: 1.25 mV
Lead Channel Sensing Intrinsic Amplitude: 1.25 mV
Lead Channel Sensing Intrinsic Amplitude: 7.5 mV
Lead Channel Sensing Intrinsic Amplitude: 7.5 mV
Lead Channel Setting Pacing Amplitude: 1.5 V
Lead Channel Setting Pacing Amplitude: 3 V
Lead Channel Setting Pacing Pulse Width: 0.4 ms
Lead Channel Setting Sensing Sensitivity: 0.3 mV

## 2018-11-27 DIAGNOSIS — M150 Primary generalized (osteo)arthritis: Secondary | ICD-10-CM | POA: Diagnosis not present

## 2018-11-27 DIAGNOSIS — M79671 Pain in right foot: Secondary | ICD-10-CM | POA: Diagnosis not present

## 2018-11-27 DIAGNOSIS — M545 Low back pain: Secondary | ICD-10-CM | POA: Diagnosis not present

## 2018-11-27 DIAGNOSIS — Z79899 Other long term (current) drug therapy: Secondary | ICD-10-CM | POA: Diagnosis not present

## 2018-11-27 DIAGNOSIS — M0579 Rheumatoid arthritis with rheumatoid factor of multiple sites without organ or systems involvement: Secondary | ICD-10-CM | POA: Diagnosis not present

## 2018-11-27 NOTE — Progress Notes (Signed)
Remote ICD transmission.   

## 2018-12-03 DIAGNOSIS — M0579 Rheumatoid arthritis with rheumatoid factor of multiple sites without organ or systems involvement: Secondary | ICD-10-CM | POA: Diagnosis not present

## 2018-12-03 DIAGNOSIS — Z79899 Other long term (current) drug therapy: Secondary | ICD-10-CM | POA: Diagnosis not present

## 2018-12-03 DIAGNOSIS — I255 Ischemic cardiomyopathy: Secondary | ICD-10-CM | POA: Diagnosis not present

## 2018-12-08 DIAGNOSIS — I5022 Chronic systolic (congestive) heart failure: Secondary | ICD-10-CM | POA: Diagnosis not present

## 2018-12-08 DIAGNOSIS — I472 Ventricular tachycardia: Secondary | ICD-10-CM | POA: Diagnosis not present

## 2018-12-08 DIAGNOSIS — E785 Hyperlipidemia, unspecified: Secondary | ICD-10-CM | POA: Diagnosis not present

## 2018-12-08 DIAGNOSIS — R972 Elevated prostate specific antigen [PSA]: Secondary | ICD-10-CM | POA: Diagnosis not present

## 2018-12-10 ENCOUNTER — Other Ambulatory Visit: Payer: Self-pay

## 2018-12-10 ENCOUNTER — Other Ambulatory Visit (HOSPITAL_COMMUNITY): Payer: Self-pay | Admitting: Cardiology

## 2018-12-10 ENCOUNTER — Ambulatory Visit (HOSPITAL_COMMUNITY)
Admission: RE | Admit: 2018-12-10 | Discharge: 2018-12-10 | Disposition: A | Discharge status: Home / Self Care | Payer: Medicare Other | Source: Ambulatory Visit | Attending: Cardiology | Admitting: Cardiology

## 2018-12-10 DIAGNOSIS — I5022 Chronic systolic (congestive) heart failure: Secondary | ICD-10-CM | POA: Diagnosis not present

## 2018-12-10 NOTE — Progress Notes (Signed)
Heart Failure TeleHealth Note  Due to national recommendations of social distancing due to [place] 19, Audio/video telehealth visit is felt to be most appropriate for this patient at this time.  See MyChart message from today for patient consent regarding telehealth for [facility].  Date:  12/10/2018   ID:  Nathan Pruitt, DOB [birth year], MRN [id number]  Location: Home  Provider location: [place] [facility] Type of Visit: Established patient  PCP:  Ronita Hipps, MD  Cardiologist: Dr. Aundra Dubin  Chief Complaint: Shortness of breath   History of Present Illness: Nathan Pruitt is a 73 y.o. male who presents via audio/video conferencing for a telehealth visit today.     he denies symptoms worrisome for COVID 19.   Patient has a history of CAD, PAD, ischemic CMP, and VT s/p ICD.  Echo in 2/17 showed EF 40% and regional WMAs.  In 3/18, he developed episodes of VT as well as increased dyspnea.  LHC/RHC was done, showing new occlusion of a large ramus (CTO, probably a month or so old at time of cath) and old CTO RCA with collaterals.  He had DES x 3 to ramus.  Filling pressures were optimized on RHC.  Amiodarone was increased to 200 mg daily from 100 mg daily.  No VT since PCI.   Echo in 6/19 showed EF 35-40% with wall motion abnormalities.   He has been stable recently.  Short of breath walking up a hill (his driveway) but no dyspnea on flat ground.  Not getting out much due to coronavirus.  No orthopnea/PND.  Weight has been stable.  No chest pain.    Labs (11/10): K 4.9, creatinine 1.4, LDL 70, HDL 31, LFTs normal Labs (3/11): K 4.4, creatinine 1.3, LDL 61, HDL 35 Labs (7/11): LDL 62, HDL 23 Labs (1/12): K 4.8, LFTs normal, creatinine 1.44, LDL 69, HDL 33 Labs (7/12): LFTs normal, LDL 81, HDL 36, HCT normal, K 4.3, creatinine 1.3, BNP 116, TSH normal Labs (1/13): LDL 39, HDl 31, LFTs normal, TSH  Labs (7/13): TSH normal, LFTs normal, LDL 69, HDL 40 Labs  (8/13): K 4.5, creatinine 1.6 Labs (10/13): K 4.4, creatinine 1.4 Labs (3/14): LDL 108, HDL 30, LFTs normal Labs (9/14): K 4.2, creatinine 1.6, LFTs normal, LDL 114, TSH 10.6 (elevated), free T4/free T3 normal Labs (2/15): K 4.6, creatinine 1.5, LFTs normal, TSH 7.6 (elevated), free T4 and free T3 normal Labs (3/15): K 3.6, creatinine 1.5, LFTs normal, TSH normal Labs (9/15): K 4.3, creatinine 1.6, LFTs normal, TSH normal, LDL 115, HDL 24 Labs (10/16): K 4.5, creatinine 1.5, LDL 90, HDL 35, hgb 12.8 Labs (11/16): Low free T3, normal TSH and free T4 Labs (12/16): K 5, creatinine 1.8, LFTs normal, TSH 8.6 (mildly elevated) Labs (1/17): Free T4/TSH/free T3 normal, LFTs normal Labs (2/17): K 4.4, creatinine 2.4, LDL 131, HDL 34 Labs (4/17): K 4.2, creatinine 1.3, LDL 67, HDL 35 Labs (9/17): K 4.9, creatinine 2.2, LDL 54, HDL 35, free T3 and T4 normal, hgb 12 Labs (4/18): K 5, creatinine 1.78, LFTs normal, TSH elevated but free T3 and free T4 normal.  Labs (6/18): LDL 52, HDL 35, TSH elevated 6.8, LFTs normal Labs (7/18): K 4.3, creatinine 1.68 Labs (10/18): TSH 5.6, free T3 normal, free T4 normal, LFTs normal, K 4.9, creatinine 1.4 Labs (11/18): K 4.5, creatinine 1.46, LFTs normal Labs (4/19): K 4.8, creatinine 1.6, LFTs normal Labs (6/19): TSH slightly elevated, free T3/T4 both normal, LFTs normal Labs (  7/19): K 4.3, creatinine 1.3 Labs (10/19): LFTs normal, LDL 58, TGs 165, TSH 6.6 but free T4 and free T3 normal Labs (1/20): K 4.7, creatinine 1.7 Labs (2/20): LFTs normal Labs (4/20): K 4.6, creatinine 1.4, LDL 77, HDL 36, hgb 12.2, TSH mild elevation 7.4, free T4 and free T3 normal.  Labs (7/20): K 3.8, creatinine 1.6, hgb 12.2, LDL 81, TGs 158, HDL 35, TSH 7.7 (mildly elevated), LFTs normal  Allergies (verified):  No Known Drug Allergies  Past Medical History: 1. Coronary artery disease. The patient reports history of silent MI in 1993.  This was likely an inferior MI (see below).   - The patient presented to [facility] in 5/10 with VT and mildly elevated cardiac enzymes. LHC (5/10):  Inferobasal dyskinesis with EF 35-40%.  There was chronic total occlusion of the mid RCA with good collaterals.  Luminals LCA.  This did not appear to be an acute cause of the 5/10 event.  - LHC (3/18): Known occlusion of RCA with collaterals, new occlusion of ramus.  DES x 3 to ramus.  2. Hypertension.  3. Hyperlipidemia: Intolerant of higher doses of simvastatin, Crestor, Lipitor, pravastatin. Zetia caused constipation.  4. Remote tobacco abuse with 47-pack-year history, quitting in August 2009.  5. Peripheral arterial disease.  - Status post left-to-right fem-fem bypass performed in [place] in March 2009.  - Status post redo left-to-right fem-fem bypass performed by Dr. Scot Dock at [facility] in 2009.  - ABIs normal 3/15, ABIs normal 3/16, ABIs normal 3/17, ABIs normal 4/18, ABIs normal 4/19.  6. Rheumatoid arthritis, on leflunomide.  7.  Ischemic cardiomyopathy:  EF 35-40% by LV-gram 5/10 with inferobasal dyskinesis.  Echo 5/10 showed EF 40% with mild LVH, no significant MR, inferobasal and posterobasal akinesis.  Echo (7/11): EF 50%, mild LVH, basal-mid inferoposterior akinesis.  Echo (7/12): EF 45-50% with basal anterolateral, basal posterior, and basal to mid inferior akinesis.  Echo (4/16): EF 40-45%, basal to mid inferolateral AK, basal inferior AK, basal to mid anterolateral HK.  Echo (2/17): EF 40% with basal to mid inferior akinesis, basal inferolateral aneurysm, mid inferolateral akinesis, basal anterolateral hypokinesis, normal RV size and systolic function, PASP 25 mmHg.  - Echo (3/18) with EF 30-35%, moderate LV dilation, moderate MR, mildly decreased RV systolic function, PASP 51 mmmHg. - RHC (3/18): mean RA 4, PA 38/12, mean PCWP 17, CI 2.2.  - Echo (6/19): EF 35-40%, inferior/inferolateral/anterolateral WMAs, moderate MR likely infarct-related, normal RV size with mildly decreased  systolic function.  8.  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  He has a dual chamber Medtronic ICD.  9.  Atrial flutter:  Status post isthmus ablation 5/10.   10.  PFTs (7/11): FVC 74%, FEV1 80%, ratio 75%, TLC 78%, DLCO 68%.  This suggests a mild restrictive defect and a mild obstructive defect.  He did have response to bronchodilator. These PFTs were significantly better than the report from Dr. Alcide Clever in [place] done prior.  He had last PFTs in 2/12 with no significant change.  11.  Anxiety 12.  Chronic cough: No relief with change from ACEI to ARB or with trial of PPI.  13.  CKD: Stage 3.  14. Mitral regurgitation: Moderate on 6/19 echo, suspect infarct-related.  15. AAA: 3.0 cm AAA on abdominal US 4/19.   Current Outpatient Medications  Medication Sig Dispense Refill  . amiodarone (PACERONE) 200 MG tablet TAKE 1 TABLET BY MOUTH DAILY 90 tablet 3  .  aspirin EC 81 MG tablet Take 1 tablet (81 mg total) by mouth daily.    . carvedilol (COREG) 25 MG tablet TAKE 1 TABLET (25 MG TOTAL) BY MOUTH 2 (TWO) TIMES DAILY WITH A MEAL. 180 tablet 0  . chlorhexidine (PERIDEX) 0.12 % solution Swish and rinse as needed for dental  3  . clopidogrel (PLAVIX) 75 MG tablet TAKE 1 TABLET (75 MG TOTAL) BY MOUTH DAILY WITH BREAKFAST. 90 tablet 2  . eplerenone (INSPRA) 25 MG tablet Take 1 tablet (25 mg total) by mouth daily. 90 tablet 3  . fluticasone (FLONASE) 50 MCG/ACT nasal spray Place 1-2 sprays into both nostrils daily as needed for allergies or rhinitis.    . furosemide (LASIX) 40 MG tablet Take 1 tablet (40 mg total) by mouth daily. 90 tablet 2  . leflunomide (ARAVA) 20 MG tablet Take 20 mg by mouth daily.      . Melatonin 10 MG TABS Take 10 mg by mouth at bedtime as needed (sleep).     . simvastatin (ZOCOR) 20 MG tablet TAKE 1 TABLET (20 MG TOTAL) BY MOUTH AT BEDTIME. 90 tablet 3  . valsartan (DIOVAN) 80 MG tablet Take 1 tablet (80 mg total) by  mouth 2 (two) times daily. 180 tablet 3   No current facility-administered medications for this encounter.     Allergies:   Atorvastatin, Crestor [rosuvastatin calcium], Losartan, Telmisartan, Enalapril, and Lisinopril   Social History:  The patient  reports that he quit smoking about 11 years ago. He has a 47.00 pack-year smoking history. He has never used smokeless tobacco. He reports current alcohol use. He reports that he does not use drugs.   Family History:  The patient's family history includes Gastric cancer in his mother; Heart attack (age of onset: 7) in his brother; Peripheral vascular disease in his father; Throat cancer in his father.   ROS:  Please see the history of present illness.   All other systems are personally reviewed and negative.   Exam:  (Video/Tele Health Call; Exam is subjective and or/visual.) BP 128/72 General: NAD Neck: No JVD Lungs: Normal respiratory effort with conversation.  Abdomen: Non-distended per patient report.  Extremities: No edema.  Neuro: Alert/oriented x 3.   Recent Labs: 02/16/2018: Hemoglobin 12.3; Platelets 264 06/10/2018: ALT 15; BUN 25; Creatinine, Ser 1.46; Potassium 4.2; Sodium 141; TSH 3.999  Personally reviewed   Wt Readings from Last 3 Encounters:  06/10/18 86.6 kg (191 lb)  02/16/18 85.5 kg (188 lb 6.4 oz)  02/16/18 85.3 kg (188 lb)     ASSESSMENT AND PLAN:  1. Chronic systolic CHF: Ischemic cardiomyopathy.  Medtronic ICD.  Echo (6/19) with EF 35-40%. NYHA class II symptoms. He is not volume overloaded.   - Continue Coreg 25 mg bid.  - We have discussed transitioning to [facility], he wants to stay on valsartan (says Delene Loll would send him into donut hole too fast).  Continue valsartan at 80 mg bid. Creatinine 1.6 when recently checked.   - He is on eplerenone 25 mg daily.  Says that he became hyperkalemic on eplerenone 50 mg daily so I will not increase any further today.    - Continue Lasix 40 mg daily.  - He will get a  repeat echo at next appointment in 3 months (will see in office at that time).  2. CAD: Denies chest pain.  Had DES x 3 to ramus in 3/18.   - Continue ASA 81 mg daily, clopidogrel, Zocor 20 mg daily,  and Coreg/ARB as above.   3. HLD: Stable on simvastatin without myalgias.  Has not been able to tolerate higher dose of simvastatin or other statins. Unable to tolerate Zetia due to constipation.  Recent LDL was 81, which is adequate.  He has not wanted to go on Repatha.   4. Hx of VT: Continue amiodarone 200 mg daily (increased from 100 mg daily after last VT event). Has ICD.  - Subclinical hypothyroidism (has had mildly elevated TSH with normal free T3 and free T4).  Follow TSH, if further rise may need low dose Levoxyl. - Normal LFTs in 7/20. - Knows to get a yearly eye exam 5. PAD: He denies claudication.  Normal ABIs in 4/19, followed by [facility].   6. AAA: 3.0 cm in 4/19, followed by [facility].  7. CKD: Stage 3.  Stable on 7/20 labs.    COVID screen The patient does not have any symptoms that suggest any further testing/ screening at this time.  Social distancing reinforced today.  Patient Risk: After full review of this patients clinical status, I feel that they are at moderate risk for cardiac decompensation at this time.  Relevant cardiac medications were reviewed at length with the patient today. The patient does not have concerns regarding their medications at this time.   Recommended follow-up:  3 months in office with echo.   Today, I have spent 18 minutes with the patient with telehealth technology discussing the above issues .    Signed, Loralie Champagne, MD  12/10/2018   [place] [address] Heart and [place] [address] [phone] (office) [phone] (fax)

## 2018-12-17 ENCOUNTER — Telehealth (HOSPITAL_COMMUNITY): Payer: Self-pay | Admitting: *Deleted

## 2018-12-17 NOTE — Telephone Encounter (Signed)
Pt requests standing orders for cbc cmet tsh and lipids at [place] ok to do.  I called Kristy at [facility] per pts request to get a fax number to fax standing orders no answer. Will try again Monday.  Kristy [phone]

## 2019-01-23 ENCOUNTER — Other Ambulatory Visit (HOSPITAL_COMMUNITY): Payer: Self-pay | Admitting: Cardiology

## 2019-01-25 DIAGNOSIS — I10 Essential (primary) hypertension: Secondary | ICD-10-CM | POA: Diagnosis not present

## 2019-01-26 ENCOUNTER — Other Ambulatory Visit: Payer: Self-pay | Admitting: Internal Medicine

## 2019-01-26 ENCOUNTER — Telehealth: Payer: Self-pay | Admitting: Nurse Practitioner

## 2019-01-26 ENCOUNTER — Other Ambulatory Visit (HOSPITAL_COMMUNITY): Payer: Self-pay

## 2019-01-26 MED ORDER — SIMVASTATIN 20 MG PO TABS: 20.0000 mg | ORAL_TABLET | Freq: Every day | ORAL | 3 refills | Status: DC

## 2019-01-26 NOTE — Telephone Encounter (Signed)
I spoke to the patient and he submitted an email to me which Amber can use at his upcoming appointment in October. [email or web address].  Please call if not able to do.

## 2019-01-26 NOTE — Telephone Encounter (Signed)
Please contact patient so he can give you the e-mail address to use for his upcoming Virtual Visit with [facility]. He does NOT want to come into the office, and is adamant of having a virtual visit after the office receives his home remote pacemaker check.  He also would not give me the e-mail address to use to set up the virtual visit.

## 2019-01-26 NOTE — Telephone Encounter (Signed)
Pt's medication was sent to pt's pharmacy as requested. Confirmation received.  °

## 2019-02-03 DIAGNOSIS — Z23 Encounter for immunization: Secondary | ICD-10-CM | POA: Diagnosis not present

## 2019-02-16 ENCOUNTER — Ambulatory Visit (INDEPENDENT_AMBULATORY_CARE_PROVIDER_SITE_OTHER): Payer: Medicare Other | Admitting: *Deleted

## 2019-02-16 DIAGNOSIS — I472 Ventricular tachycardia, unspecified: Secondary | ICD-10-CM

## 2019-02-16 DIAGNOSIS — I255 Ischemic cardiomyopathy: Secondary | ICD-10-CM

## 2019-02-16 LAB — CUP PACEART REMOTE DEVICE CHECK
Battery Remaining Longevity: 106 mo
Battery Voltage: 3 V
Brady Statistic AP VP Percent: 0.06 %
Brady Statistic AP VS Percent: 16.2 %
Brady Statistic AS VP Percent: 0.48 %
Brady Statistic AS VS Percent: 83.26 %
Brady Statistic RA Percent Paced: 16.07 %
Brady Statistic RV Percent Paced: 0.59 %
Date Time Interrogation Session: 20201013193529
HighPow Impedance: 43 Ohm
HighPow Impedance: 57 Ohm
Implantable Lead Implant Date: 20100520
Implantable Lead Implant Date: 20100520
Implantable Lead Location: 753859
Implantable Lead Location: 753860
Implantable Lead Model: 5076
Implantable Lead Model: 6947
Implantable Pulse Generator Implant Date: 20180710
Lead Channel Impedance Value: 266 Ohm
Lead Channel Impedance Value: 342 Ohm
Lead Channel Impedance Value: 399 Ohm
Lead Channel Pacing Threshold Amplitude: 0.5 V
Lead Channel Pacing Threshold Amplitude: 1 V
Lead Channel Pacing Threshold Pulse Width: 0.4 ms
Lead Channel Pacing Threshold Pulse Width: 0.4 ms
Lead Channel Sensing Intrinsic Amplitude: 1.75 mV
Lead Channel Sensing Intrinsic Amplitude: 1.75 mV
Lead Channel Sensing Intrinsic Amplitude: 8.875 mV
Lead Channel Sensing Intrinsic Amplitude: 8.875 mV
Lead Channel Setting Pacing Amplitude: 1.5 V
Lead Channel Setting Pacing Amplitude: 2.75 V
Lead Channel Setting Pacing Pulse Width: 0.4 ms
Lead Channel Setting Sensing Sensitivity: 0.3 mV

## 2019-02-18 ENCOUNTER — Telehealth (INDEPENDENT_AMBULATORY_CARE_PROVIDER_SITE_OTHER): Payer: Medicare Other | Admitting: Nurse Practitioner

## 2019-02-18 ENCOUNTER — Telehealth: Payer: Self-pay

## 2019-02-18 ENCOUNTER — Encounter: Payer: Self-pay | Admitting: Nurse Practitioner

## 2019-02-18 VITALS — BP 120/70 | HR 52 | Ht 70.0 in | Wt 180.0 lb

## 2019-02-18 DIAGNOSIS — I472 Ventricular tachycardia, unspecified: Secondary | ICD-10-CM

## 2019-02-18 DIAGNOSIS — I5022 Chronic systolic (congestive) heart failure: Secondary | ICD-10-CM

## 2019-02-18 DIAGNOSIS — I255 Ischemic cardiomyopathy: Secondary | ICD-10-CM

## 2019-02-18 NOTE — Progress Notes (Signed)
Electrophysiology TeleHealth Note   Due to national recommendations of social distancing due to COVID 19, an audio/video telehealth visit is felt to be most appropriate for this patient at this time.  See MyChart message from today for the patient's consent to telehealth for [facility].   Date:  02/18/2019   ID:  Nathan Pruitt, DOB [birth year], MRN [id number]  Location: patient's home  Provider location:  [facility]  Evaluation Performed: Follow-up visit  PCP:  Ronita Hipps, MD   Electrophysiologist:  Dr Rayann Heman  Chief Complaint:  ICD follow up  History of Present Illness:    Nathan Pruitt is a 73 y.o. male who presents via telehealth conferencing today.  Since last being seen in our clinic, the patient reports doing relatively well.  He is staying inside mostly. He is trying to lose some weight. He has noticed some increased shortness of breath with activity.  He has an echo scheduled with Dr Aundra Dubin coming up. Today, he denies symptoms of palpitations, chest pain,   lower extremity edema, dizziness, presyncope, or syncope.  The patient is otherwise without complaint today.  The patient denies symptoms of fevers, chills, cough, or new SOB worrisome for COVID 19.  Past Medical History:  Diagnosis Date   Abnormal PFTs 7/11   FVC 74%, FEV1 80%, ratio 75%, TLC 78%, DLCO 68%. this suggests a mild restrictive and obstructive defect. pt did have a response to bronchodilator. these PFTs were significantly better than the report from Dr. Alcide Clever in [place] done prior.    AICD (automatic cardioverter/defibrillator) present    Anxiety    Atrial flutter (HCC)    s/p isthmus ablation 5/10   CAD (coronary artery disease)    hx of silent MI in 1993. likely an inferior MI. hx of 2D cardiogram in 3/09 showing EF of 40%. hx of myoview in [place] 3/09-nml. presented to [facility] 5/10 with VT and mildly elevated cardiac enzymes LHC (5/10): inferobasal dyskinesis with EF 35-40%. was chronic  total occlusion of mid RCA with good collaterals. luminals LCA. this does not appear to be an acute cause of the 5/10 event   Cancer ([place])    skin   CHF (congestive heart failure) (HCC)    Chronic lower back pain    HLD (hyperlipidemia)    HTN (hypertension)    Ischemic cardiomyopathy    EF 35-40% by LV-gram 5/10 with inferobasal dyskinesis. echo 5/10 showed EF 40% w/mild LVH, no sig. MR, inferobasal and posterobasal akinesis. 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 Scot Dock at [facility] in 2009    Rheumatoid arthritis [facility])    on leflunomide   Silent myocardial infarction [facility]) 1993   "silent"   Tobacco abuse    47 pack year hx; quit 8/09   Ventricular tachycardia ([place])    likely scar-mediated. 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 He has duel chamber Medtronic ICD    Past Surgical History:  Procedure Laterality Date   CARDIAC DEFIBRILLATOR PLACEMENT  09/22/2008   CATARACT EXTRACTION W/ INTRAOCULAR LENS  IMPLANT, BILATERAL Bilateral 05/2016 - 06/2016   CORONARY CTO INTERVENTION  07/16/2016   CORONARY CTO INTERVENTION N/A 07/16/2016   Procedure: Coronary CTO Intervention;  Surgeon: Belva Crome, MD;  Location: [place] [facility];  Service: Cardiovascular;  Laterality: N/A;  EP IMPLANTABLE DEVICE  09/22/08   Medtronic, ICD Model Number:  [id number], ICD Serial Number: [id number]   FEMORAL ARTERY - FEMORAL ARTERY BYPASS GRAFT  march 2009   left to right bypass, first @ [facility], second at [facility] by Dr Scot Dock   ICD GENERATOR CHANGEOUT N/A 11/12/2016   Procedure: ICD Generator Changeout;  Surgeon: Thompson Grayer, MD;  Location: [place] [facility];  Service: Cardiovascular;  Laterality: N/A;   RIGHT/LEFT HEART CATH AND CORONARY ANGIOGRAPHY N/A 07/12/2016   Procedure:  Right/Left Heart Cath and Coronary Angiography;  Surgeon: Larey Dresser, MD;  Location: [place] [facility];  Service: Cardiovascular;  Laterality: N/A;   TONSILLECTOMY     VASECTOMY     VENTRICULAR ABLATION SURGERY  09/2008    Current Outpatient Medications  Medication Sig Dispense Refill   amiodarone (PACERONE) 200 MG tablet TAKE 1 TABLET BY MOUTH DAILY 90 tablet 3   aspirin EC 81 MG tablet Take 1 tablet (81 mg total) by mouth daily.     carvedilol (COREG) 25 MG tablet Take 1 tablet (25 mg total) by mouth 2 (two) times daily with a meal. Please make yearly appt with Dr. Rayann Heman for October for future refills. 1st attempt 180 tablet 0   chlorhexidine (PERIDEX) 0.12 % solution Swish and rinse as needed for dental  3   clopidogrel (PLAVIX) 75 MG tablet TAKE 1 TABLET (75 MG TOTAL) BY MOUTH DAILY WITH BREAKFAST. 90 tablet 2   eplerenone (INSPRA) 25 MG tablet Take 1 tablet (25 mg total) by mouth daily. 90 tablet 3   fluticasone (FLONASE) 50 MCG/ACT nasal spray Place 1-2 sprays into both nostrils daily as needed for allergies or rhinitis.     furosemide (LASIX) 40 MG tablet TAKE 1 TABLET BY MOUTH EVERY DAY 90 tablet 2   leflunomide (ARAVA) 20 MG tablet Take 20 mg by mouth daily.       Melatonin 10 MG TABS Take 10 mg by mouth at bedtime as needed (sleep).      simvastatin (ZOCOR) 20 MG tablet Take 1 tablet (20 mg total) by mouth at bedtime. 90 tablet 3   valsartan (DIOVAN) 80 MG tablet Take 1 tablet (80 mg total) by mouth 2 (two) times daily. 180 tablet 3   No current facility-administered medications for this visit.     Allergies:   Atorvastatin, Crestor [rosuvastatin calcium], Losartan, Telmisartan, Enalapril, and Lisinopril   Social History:  The patient  reports that he quit smoking about 11 years ago. He has a 47.00 pack-year smoking history. He has never used smokeless tobacco. He reports current alcohol use. He reports that he does not use drugs.   Family History:  The  patient's  family history includes Gastric cancer in his mother; Heart attack (age of onset: 30) in his brother; Peripheral vascular disease in his father; Throat cancer in his father.   ROS:  Please see the history of present illness.   All other systems are personally reviewed and negative.    Exam:    Vital Signs:  BP 120/70    Pulse (!) 52    Ht 5\' 10"  (1.778 m)    Wt 180 lb (81.6 kg)    BMI 25.83 kg/m   Well sounding and appearing, alert and conversant, regular work of breathing,  good skin color Eyes- anicteric, neuro- grossly intact, skin- no apparent rash or lesions or cyanosis, mouth- oral mucosa is pink  Labs/Other Tests and Data Reviewed:  Recent Labs: 06/10/2018: ALT 15; BUN 25; Creatinine, Ser 1.46; Potassium 4.2; Sodium 141; TSH 3.999   Wt Readings from Last 3 Encounters:  02/18/19 180 lb (81.6 kg)  06/10/18 191 lb (86.6 kg)  02/16/18 188 lb 6.4 oz (85.5 kg)     Last device remote is reviewed from [place] PDF which reveals normal device function, no arrhythmias    ASSESSMENT & PLAN:    1.  Chronic systolic heart failure/ICM Euovelmic by symptoms Followed by AHF clinic Normal ICD function by recent remote  2.  VT No recent recurrence Continue amiodarone TSH is being followed closely  3.  HTN Stable No change required today    Follow-up:  [facility], 1 year Dr Rayann Heman, Dr Aundra Dubin as scheduled    Patient Risk:  after full review of this patients clinical status, I feel that they are at moderate risk at this time.  Today, I have spent 15 minutes with the patient with telehealth technology discussing arrhythmia management .    Signed, Chanetta Marshall, NP  02/18/2019 11:59 AM     [facility] [address] [place] [address] [phone] (office) [phone] (fax)

## 2019-02-18 NOTE — Telephone Encounter (Signed)
Virtual visit 

## 2019-02-25 DIAGNOSIS — L820 Inflamed seborrheic keratosis: Secondary | ICD-10-CM | POA: Diagnosis not present

## 2019-02-25 DIAGNOSIS — D225 Melanocytic nevi of trunk: Secondary | ICD-10-CM | POA: Diagnosis not present

## 2019-02-25 DIAGNOSIS — C44719 Basal cell carcinoma of skin of left lower limb, including hip: Secondary | ICD-10-CM | POA: Diagnosis not present

## 2019-02-25 DIAGNOSIS — D2239 Melanocytic nevi of other parts of face: Secondary | ICD-10-CM | POA: Diagnosis not present

## 2019-02-25 DIAGNOSIS — L570 Actinic keratosis: Secondary | ICD-10-CM | POA: Diagnosis not present

## 2019-02-25 DIAGNOSIS — L821 Other seborrheic keratosis: Secondary | ICD-10-CM | POA: Diagnosis not present

## 2019-02-26 NOTE — Progress Notes (Signed)
Remote ICD transmission.   

## 2019-03-10 DIAGNOSIS — C44629 Squamous cell carcinoma of skin of left upper limb, including shoulder: Secondary | ICD-10-CM | POA: Diagnosis not present

## 2019-03-15 ENCOUNTER — Encounter (HOSPITAL_COMMUNITY): Payer: Self-pay

## 2019-03-16 DIAGNOSIS — M0579 Rheumatoid arthritis with rheumatoid factor of multiple sites without organ or systems involvement: Secondary | ICD-10-CM | POA: Diagnosis not present

## 2019-03-16 DIAGNOSIS — Z79899 Other long term (current) drug therapy: Secondary | ICD-10-CM | POA: Diagnosis not present

## 2019-03-19 ENCOUNTER — Other Ambulatory Visit (HOSPITAL_COMMUNITY): Payer: Self-pay | Admitting: Cardiology

## 2019-03-19 ENCOUNTER — Ambulatory Visit (HOSPITAL_COMMUNITY): Payer: Medicare Other

## 2019-03-19 ENCOUNTER — Encounter (HOSPITAL_COMMUNITY): Payer: Medicare Other | Admitting: Cardiology

## 2019-03-28 ENCOUNTER — Encounter (HOSPITAL_COMMUNITY): Payer: Self-pay

## 2019-04-05 ENCOUNTER — Other Ambulatory Visit: Payer: Self-pay | Admitting: Internal Medicine

## 2019-04-06 ENCOUNTER — Other Ambulatory Visit (HOSPITAL_COMMUNITY): Payer: Self-pay | Admitting: *Deleted

## 2019-04-06 ENCOUNTER — Other Ambulatory Visit: Payer: Self-pay | Admitting: Internal Medicine

## 2019-04-06 MED ORDER — CARVEDILOL 25 MG PO TABS: 25.0000 mg | ORAL_TABLET | Freq: Two times a day (BID) | ORAL | 3 refills | Status: DC

## 2019-04-06 MED ORDER — SIMVASTATIN 20 MG PO TABS: 20.0000 mg | ORAL_TABLET | Freq: Every day | ORAL | 3 refills | Status: DC

## 2019-04-06 NOTE — Telephone Encounter (Signed)
Pt's medication was sent to pt's pharmacy as requested. Confirmation received.  °

## 2019-04-07 ENCOUNTER — Other Ambulatory Visit: Payer: Self-pay

## 2019-04-07 ENCOUNTER — Encounter (HOSPITAL_COMMUNITY): Payer: Self-pay

## 2019-04-07 DIAGNOSIS — I779 Disorder of arteries and arterioles, unspecified: Secondary | ICD-10-CM

## 2019-04-08 ENCOUNTER — Ambulatory Visit (HOSPITAL_COMMUNITY)
Admission: RE | Admit: 2019-04-08 | Discharge: 2019-04-08 | Disposition: A | Discharge status: Home / Self Care | Payer: Medicare Other | Source: Ambulatory Visit | Attending: Family | Admitting: Family

## 2019-04-08 ENCOUNTER — Ambulatory Visit (INDEPENDENT_AMBULATORY_CARE_PROVIDER_SITE_OTHER)
Admission: RE | Admit: 2019-04-08 | Discharge: 2019-04-08 | Disposition: A | Discharge status: Home / Self Care | Payer: Medicare Other | Source: Ambulatory Visit | Attending: Family | Admitting: Family

## 2019-04-08 ENCOUNTER — Other Ambulatory Visit: Payer: Self-pay

## 2019-04-08 ENCOUNTER — Ambulatory Visit (INDEPENDENT_AMBULATORY_CARE_PROVIDER_SITE_OTHER): Payer: Medicare Other | Admitting: Family

## 2019-04-08 ENCOUNTER — Encounter: Payer: Self-pay | Admitting: Family

## 2019-04-08 VITALS — BP 128/74 | HR 74 | Temp 97.9°F | Resp 14 | Ht 70.0 in | Wt 181.3 lb

## 2019-04-08 DIAGNOSIS — I779 Disorder of arteries and arterioles, unspecified: Secondary | ICD-10-CM

## 2019-04-08 DIAGNOSIS — I77811 Abdominal aortic ectasia: Secondary | ICD-10-CM

## 2019-04-08 DIAGNOSIS — Z87891 Personal history of nicotine dependence: Secondary | ICD-10-CM | POA: Diagnosis not present

## 2019-04-08 NOTE — Progress Notes (Signed)
[facility] [place]   CC: Follow up peripheral artery occlusive disease  History of Present Illness Adrew Pruitt is a 73 y.o. male who is s/p redo left to right fem-fem bypass graft on 12/30/07 by Dr. Scot Dock. He returns today for routine surveillance.  He denies claudication type symptoms in his legs with walking, denies non healing wounds. He admits to not walking much, states he did not have any stamina.  Has a defibrillator in place, he denies history of stroke or TIA.  He had skin cancer removed from his head and left arm. He had 3 cardiac stents placed in March 2018, denieschest pain, denies dyspnea.   He states he is rarely light-headed.   Diabetic: No Tobacco use: former smoker, quit in 2010when he had v-tach and ICD placed  Pt meds include: Statin :Yes ASA: Yes Other anticoagulants/antiplatelets:Plavix since cardiac stents placed March 2018    Past Medical History:  Diagnosis Date  . Abnormal PFTs 7/11   FVC 74%, FEV1 80%, ratio 75%, TLC 78%, DLCO 68%. this suggests a mild restrictive and obstructive defect. pt did have a response to bronchodilator. these PFTs were significantly better than the report from Dr. Alcide Clever in [place] done prior.   [name] AICD (automatic cardioverter/defibrillator) present   . Anxiety   . Atrial flutter ([place])    s/p isthmus ablation 5/10  . CAD (coronary artery disease)    hx of silent MI in 1993. likely an inferior MI. hx of 2D cardiogram in 3/09 showing EF of 40%. hx of myoview in [place] 3/09-nml. presented to [facility] 5/10 with VT and mildly elevated cardiac enzymes LHC (5/10): inferobasal dyskinesis with EF 35-40%. was chronic total occlusion of mid RCA with good collaterals. luminals LCA. this does not appear to be an acute cause of the 5/10 event  . Cancer ([place])    skin  . CHF (congestive heart failure) ([place])   . Chronic lower back pain   . HLD (hyperlipidemia)   . HTN (hypertension)   . Ischemic cardiomyopathy     EF 35-40% by LV-gram 5/10 with inferobasal dyskinesis. echo 5/10 showed EF 40% w/mild LVH, no sig. MR, inferobasal and posterobasal akinesis. echo (7/11): EF 50%, mild LVH, basal-mid inferoposterior akenesis  . Migraine    "visual migraine; maybe 12/year" (07/16/2016)  . PAD (peripheral artery disease) (HCC)    s/p L-to-R fem-fem bypass performed by Dr. Scot Dock at [facility] in 2009   . Rheumatoid arthritis (HCC)    on leflunomide  . 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 Tobacco abuse    47 pack year hx; quit 8/09  . Ventricular tachycardia ([place])    likely scar-mediated. 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 He has duel chamber Medtronic ICD    Social History Social History   Tobacco Use  . Smoking status: Former Smoker    Packs/day: 1.00    Years: 47.00    Pack years: 47.00    Quit date: 2009    Years since quitting: 11.9  . Smokeless tobacco: Never Used  Substance Use Topics  . Alcohol use: Yes    Alcohol/week: 0.0 standard drinks    Comment: 07/15/2016 "nothing for the last couple years"  . Drug use: No    Comment: prior     Family History Family History  Problem Relation Age of Onset  . Gastric cancer Mother   . Throat cancer Father   . Peripheral vascular disease Father   .  Heart attack Brother 80    Past Surgical History:  Procedure Laterality Date  . CARDIAC DEFIBRILLATOR PLACEMENT  09/22/2008  . CATARACT EXTRACTION W/ INTRAOCULAR LENS  IMPLANT, BILATERAL Bilateral 05/2016 - 06/2016  . CORONARY CTO INTERVENTION  07/16/2016  . CORONARY CTO INTERVENTION N/A 07/16/2016   Procedure: Coronary CTO Intervention;  Surgeon: Belva Crome, MD;  Location: [place] [facility];  Service: Cardiovascular;  Laterality: N/A;  . EP IMPLANTABLE DEVICE  09/22/08   Medtronic, ICD Model Number:  [id number], ICD Serial Number: [id number]  . FEMORAL ARTERY - FEMORAL ARTERY BYPASS GRAFT  march 2009   left to right bypass, first @ [facility],  second at [facility] by Dr Scot Dock  . ICD GENERATOR CHANGEOUT N/A 11/12/2016   Procedure: ICD Generator Changeout;  Surgeon: Thompson Grayer, MD;  Location: [place] [facility];  Service: Cardiovascular;  Laterality: N/A;  . RIGHT/LEFT HEART CATH AND CORONARY ANGIOGRAPHY N/A 07/12/2016   Procedure: Right/Left Heart Cath and Coronary Angiography;  Surgeon: Larey Dresser, MD;  Location: [place] [facility];  Service: Cardiovascular;  Laterality: N/A;  . TONSILLECTOMY    . VASECTOMY    . VENTRICULAR ABLATION SURGERY  09/2008    Allergies  Allergen Reactions  . Atorvastatin Other (See Comments)    Muscle pain  . Crestor [Rosuvastatin Calcium] Other (See Comments)    Muscle pain  . Losartan Other (See Comments)    Muscle pain   . Telmisartan Other (See Comments)    Stomach ache  . Enalapril Other (See Comments)    Upset stomach  . Lisinopril Cough    Current Outpatient Medications  Medication Sig Dispense Refill  . amiodarone (PACERONE) 200 MG tablet TAKE 1 TABLET BY MOUTH DAILY 90 tablet 3  . aspirin EC 81 MG tablet Take 1 tablet (81 mg total) by mouth daily.    . carvedilol (COREG) 25 MG tablet Take 1 tablet (25 mg total) by mouth 2 (two) times daily with a meal. 180 tablet 3  . chlorhexidine (PERIDEX) 0.12 % solution Swish and rinse as needed for dental  3  . clopidogrel (PLAVIX) 75 MG tablet TAKE 1 TABLET (75 MG TOTAL) BY MOUTH DAILY WITH BREAKFAST. 90 tablet 2  . eplerenone (INSPRA) 25 MG tablet Take 1 tablet (25 mg total) by mouth daily. 90 tablet 3  . fluticasone (FLONASE) 50 MCG/ACT nasal spray Place 1-2 sprays into both nostrils daily as needed for allergies or rhinitis.    . furosemide (LASIX) 40 MG tablet TAKE 1 TABLET BY MOUTH EVERY DAY 90 tablet 2  . leflunomide (ARAVA) 20 MG tablet Take 20 mg by mouth daily.      . Melatonin 10 MG TABS Take 10 mg by mouth at bedtime as needed (sleep).     . simvastatin (ZOCOR) 20 MG tablet Take 1 tablet (20 mg total) by mouth at bedtime. 90  tablet 3  . valsartan (DIOVAN) 80 MG tablet Take 1 tablet (80 mg total) by mouth 2 (two) times daily. 180 tablet 3   No current facility-administered medications for this visit.     ROS: See HPI for pertinent positives and negatives.   Physical Examination  Vitals:   04/08/19 0919  BP: 128/74  Pulse: 74  Resp: 14  Temp: 97.9 F (36.6 C)  TempSrc: Temporal  SpO2: 97%  Weight: 181 lb 4.8 oz (82.2 kg)  Height: 5\' 10"  (1.778 m)   Body mass index is 26.01 kg/m.  General: A&O x 3,  WDWN, male in NAD. Gait: normal HENT: No gross abnormalities.  Eyes: Pupils are equal and round Pulmonary: Respirations are non labored, CTAB, good air movement in all fields Cardiac: regular rhythm, no detected murmur.  Pacemaker/AICD palpated left lower chest       Carotid Bruits Right Left   Negative Negative   Radial pulses are 2+ palpable bilaterally   Adominal aortic pulse is moderately palpable                         VASCULAR EXAM: Extremities without ischemic changes, without Gangrene; without open wounds.                                                                                                          LE Pulses Right Left       FEMORAL  faintly palpable  2+ palpable        POPLITEAL  not palpable   not palpable       POSTERIOR TIBIAL  2+ palpable   not palpable        DORSALIS PEDIS      ANTERIOR TIBIAL not palpable  1+ palpable    Abdomen: soft, NT, no palpable masses. Skin: no rashes, no cellulitis, no ulcers noted, general pallor. Musculoskeletal: no muscle wasting or atrophy.  Neurologic: A&O X 3; appropriate affect, Sensation is normal; MOTOR FUNCTION:  moving all extremities equally, motor strength 5/5 throughout. Speech is fluent/normal. CN 2-12 intact. Psychiatric: Thought content is normal, mood appropriate for clinical situation.    DATA  Abdominal Arterial Duplex (04-08-19): Abdominal Aorta  Findings: +-----------+-------+----------+----------+--------+--------+------------------+ Location   AP (cm)Trans (cm)PSV (cm/s)WaveformThrombusComments           +-----------+-------+----------+----------+--------+--------+------------------+ Proximal   2.02   2.03      94        biphasic                           +-----------+-------+----------+----------+--------+--------+------------------+ Mid        2.01   2.02      49        biphasic                           +-----------+-------+----------+----------+--------+--------+------------------+ Distal     3.13   2.99      46        biphasic                           +-----------+-------+----------+----------+--------+--------+------------------+ RT CIA Prox1.1    1.2                                 No flow visualized +-----------+-------+----------+----------+--------+--------+------------------+ LT CIA Prox1.4    1.4       134       biphasic                           +-----------+-------+----------+----------+--------+--------+------------------+  Summary: Abdominal Aorta: The largest aortic diameter remains essentially unchanged compared to prior exam. Previous diameter measurement was 3.0 x 2.9 cm obtained on 08/15/2017. The largest diameter today is 3.13 x 2.99 cms.  Note: The Femoral to femoral bypass graft appears patent.    AAA Duplex (08-15-17): 3.0 cm dilatation of the distal abdominal aorta (no previous); Right CIA: 1.0 cm; Left CIA: 1.4 cm Ectatic abdominal aorta     ABI Findings (04-08-19): +---------+------------------+-----+---------+--------+ Right    Rt Pressure (mmHg)IndexWaveform Comment  +---------+------------------+-----+---------+--------+ Brachial 113                                      +---------+------------------+-----+---------+--------+ PTA      127               1.07 triphasic          +---------+------------------+-----+---------+--------+ DP       107               0.90 biphasic          +---------+------------------+-----+---------+--------+ Great Toe77                0.65 Abnormal          +---------+------------------+-----+---------+--------+  +---------+------------------+-----+--------+-------+ Left     Lt Pressure (mmHg)IndexWaveformComment +---------+------------------+-----+--------+-------+ Brachial 119                                    +---------+------------------+-----+--------+-------+ PTA      103               0.87 biphasic        +---------+------------------+-----+--------+-------+ DP       115               0.97 biphasic        +---------+------------------+-----+--------+-------+ Great Toe84                0.71 Normal          +---------+------------------+-----+--------+-------+  +-------+-----------+-----------+------------+------------+ ABI/TBIToday's ABIToday's TBIPrevious ABIPrevious TBI +-------+-----------+-----------+------------+------------+ Right  1.07       0.65       1.06        0.80         +-------+-----------+-----------+------------+------------+ Left   0.97       0.71       1.00        0.88         +-------+-----------+-----------+------------+------------+ Summary: Right: Resting right ankle-brachial index is within normal range. No evidence of significant right lower extremity arterial disease. The right toe-brachial index is abnormal. Left: Resting left ankle-brachial index is within normal range. No evidence of significant left lower extremity arterial disease. The left toe-brachial index is normal.   ASSESSMENT: Salar Kolba is a 73 y.o. male who is s/p redo left to right fem-fem graft 12/30/07. He has no claudication symptoms, no signs of ischemia in his feet/legs.Bilateral pedal pulses are palpable. Fem-fem bypass graft pulse is palpable.   He  has a moderately palpable abdominal aortic pulse, but the largest measurement is 3.13 cm.   ABI'sremain normal.  He has no family history of aneurysms that he is aware, he has no back pain nor abdominal pain. CTA abd/pelvis in 2009 just prior to the redo of his left to right fem-fem bypass graft demonstrated no abdominal aortic aneurysm.  PLAN:  Based on the patient's vascular studies and examination, pt will return to clinic in 18 months with ABI's, 3 years for AAA duplex.  I advised him to notify us if he develops concerns re the circulation in his legs.  Daily seated leg exercises and goal or walking a total of at least 30 minutes daily.   I discussed in depth with the patient the nature of atherosclerosis, and emphasized the importance of maximal medical management including strict control of blood pressure, blood glucose, and lipid levels, obtaining regular exercise, and continued cessation of smoking.  The patient is aware that without maximal medical management the underlying atherosclerotic disease process will progress, limiting the benefit of any interventions.  The patient was given information about PAD including signs, symptoms, treatment, what symptoms should prompt the patient to seek immediate medical care, and risk reduction measures to take.  Clemon Chambers, RN, MSN, FNP-C [facility] Phone: [phone]  Clinic MD: Laqueta Due  04/08/19 9:57 AM

## 2019-04-08 NOTE — Patient Instructions (Signed)
Peripheral Vascular Disease  Peripheral vascular disease (PVD) is a disease of the blood vessels that are not part of your heart and brain. A simple term for PVD is poor circulation. In most cases, PVD narrows the blood vessels that carry blood from your heart to the rest of your body. This can reduce the supply of blood to your arms, legs, and internal organs, like your stomach or kidneys. However, PVD most often affects a person's lower legs and feet. Without treatment, PVD tends to get worse. PVD can also lead to acute ischemic limb. This is when an arm or leg suddenly cannot get enough blood. This is a medical emergency. Follow these instructions at home: Lifestyle  Do not use any products that contain nicotine or tobacco, such as cigarettes and e-cigarettes. If you need help quitting, ask your doctor.  Lose weight if you are overweight. Or, stay at a healthy weight as told by your doctor.  Eat a diet that is low in fat and cholesterol. If you need help, ask your doctor.  Exercise regularly. Ask your doctor for activities that are right for you. General instructions  Take over-the-counter and prescription medicines only as told by your doctor.  Take good care of your feet: ? Wear comfortable shoes that fit well. ? Check your feet often for any cuts or sores.  Keep all follow-up visits as told by your doctor This is important. Contact a doctor if:  You have cramps in your legs when you walk.  You have leg pain when you are at rest.  You have coldness in a leg or foot.  Your skin changes.  You are unable to get or have an erection (erectile dysfunction).  You have cuts or sores on your feet that do not heal. Get help right away if:  Your arm or leg turns cold, numb, and blue.  Your arms or legs become red, warm, swollen, painful, or numb.  You have chest pain.  You have trouble breathing.  You suddenly have weakness in your face, arm, or leg.  You become very  confused or you cannot speak.  You suddenly have a very bad headache.  You suddenly cannot see. Summary  Peripheral vascular disease (PVD) is a disease of the blood vessels.  A simple term for PVD is poor circulation. Without treatment, PVD tends to get worse.  Treatment may include exercise, low fat and low cholesterol diet, and quitting smoking. This information is not intended to replace advice given to you by your health care provider. Make sure you discuss any questions you have with your health care provider. Document Released: 07/17/2009 Document Revised: 04/04/2017 Document Reviewed: 05/30/2016 Elsevier Patient Education  2020 [employer]  

## 2019-05-03 ENCOUNTER — Other Ambulatory Visit: Payer: Self-pay | Admitting: *Deleted

## 2019-05-03 DIAGNOSIS — I779 Disorder of arteries and arterioles, unspecified: Secondary | ICD-10-CM

## 2019-05-11 ENCOUNTER — Ambulatory Visit (HOSPITAL_COMMUNITY)
Admission: RE | Admit: 2019-05-11 | Discharge: 2019-05-11 | Disposition: A | Discharge status: Home / Self Care | Payer: Medicare Other | Source: Ambulatory Visit | Attending: Cardiology | Admitting: Cardiology

## 2019-05-11 ENCOUNTER — Other Ambulatory Visit: Payer: Self-pay

## 2019-05-11 ENCOUNTER — Ambulatory Visit (HOSPITAL_BASED_OUTPATIENT_CLINIC_OR_DEPARTMENT_OTHER)
Admission: RE | Admit: 2019-05-11 | Discharge: 2019-05-11 | Disposition: A | Discharge status: Home / Self Care | Payer: Medicare Other | Source: Ambulatory Visit | Attending: Cardiology | Admitting: Cardiology

## 2019-05-11 VITALS — BP 110/60 | HR 50 | Wt 185.4 lb

## 2019-05-11 DIAGNOSIS — Z8679 Personal history of other diseases of the circulatory system: Secondary | ICD-10-CM | POA: Diagnosis not present

## 2019-05-11 DIAGNOSIS — Z7902 Long term (current) use of antithrombotics/antiplatelets: Secondary | ICD-10-CM | POA: Insufficient documentation

## 2019-05-11 DIAGNOSIS — I739 Peripheral vascular disease, unspecified: Secondary | ICD-10-CM

## 2019-05-11 DIAGNOSIS — I252 Old myocardial infarction: Secondary | ICD-10-CM | POA: Insufficient documentation

## 2019-05-11 DIAGNOSIS — Z87891 Personal history of nicotine dependence: Secondary | ICD-10-CM | POA: Insufficient documentation

## 2019-05-11 DIAGNOSIS — Z8249 Family history of ischemic heart disease and other diseases of the circulatory system: Secondary | ICD-10-CM | POA: Diagnosis not present

## 2019-05-11 DIAGNOSIS — Z79899 Other long term (current) drug therapy: Secondary | ICD-10-CM | POA: Insufficient documentation

## 2019-05-11 DIAGNOSIS — I5022 Chronic systolic (congestive) heart failure: Secondary | ICD-10-CM

## 2019-05-11 DIAGNOSIS — Z888 Allergy status to other drugs, medicaments and biological substances status: Secondary | ICD-10-CM | POA: Insufficient documentation

## 2019-05-11 DIAGNOSIS — I255 Ischemic cardiomyopathy: Secondary | ICD-10-CM | POA: Diagnosis not present

## 2019-05-11 DIAGNOSIS — Z7982 Long term (current) use of aspirin: Secondary | ICD-10-CM | POA: Diagnosis not present

## 2019-05-11 DIAGNOSIS — I130 Hypertensive heart and chronic kidney disease with heart failure and stage 1 through stage 4 chronic kidney disease, or unspecified chronic kidney disease: Secondary | ICD-10-CM | POA: Diagnosis not present

## 2019-05-11 DIAGNOSIS — M069 Rheumatoid arthritis, unspecified: Secondary | ICD-10-CM | POA: Diagnosis not present

## 2019-05-11 DIAGNOSIS — I714 Abdominal aortic aneurysm, without rupture: Secondary | ICD-10-CM | POA: Diagnosis not present

## 2019-05-11 DIAGNOSIS — I340 Nonrheumatic mitral (valve) insufficiency: Secondary | ICD-10-CM | POA: Diagnosis not present

## 2019-05-11 DIAGNOSIS — I371 Nonrheumatic pulmonary valve insufficiency: Secondary | ICD-10-CM | POA: Insufficient documentation

## 2019-05-11 DIAGNOSIS — N183 Chronic kidney disease, stage 3 unspecified: Secondary | ICD-10-CM | POA: Insufficient documentation

## 2019-05-11 DIAGNOSIS — Z800 Family history of malignant neoplasm of digestive organs: Secondary | ICD-10-CM | POA: Diagnosis not present

## 2019-05-11 DIAGNOSIS — I2510 Atherosclerotic heart disease of native coronary artery without angina pectoris: Secondary | ICD-10-CM

## 2019-05-11 DIAGNOSIS — E785 Hyperlipidemia, unspecified: Secondary | ICD-10-CM | POA: Diagnosis not present

## 2019-05-11 DIAGNOSIS — E039 Hypothyroidism, unspecified: Secondary | ICD-10-CM | POA: Diagnosis not present

## 2019-05-11 DIAGNOSIS — Z95810 Presence of automatic (implantable) cardiac defibrillator: Secondary | ICD-10-CM | POA: Insufficient documentation

## 2019-05-11 DIAGNOSIS — I4892 Unspecified atrial flutter: Secondary | ICD-10-CM | POA: Diagnosis not present

## 2019-05-11 LAB — TSH: TSH: 6.271 u[IU]/mL — ABNORMAL HIGH (ref 0.350–4.500)

## 2019-05-11 LAB — COMPREHENSIVE METABOLIC PANEL
ALT: 15 U/L (ref 0–44)
AST: 16 U/L (ref 15–41)
Albumin: 3.5 g/dL (ref 3.5–5.0)
Alkaline Phosphatase: 80 U/L (ref 38–126)
Anion gap: 10 (ref 5–15)
BUN: 32 mg/dL — ABNORMAL HIGH (ref 8–23)
CO2: 23 mmol/L (ref 22–32)
Calcium: 8.7 mg/dL — ABNORMAL LOW (ref 8.9–10.3)
Chloride: 105 mmol/L (ref 98–111)
Creatinine, Ser: 1.68 mg/dL — ABNORMAL HIGH (ref 0.61–1.24)
GFR calc Af Amer: 46 mL/min — ABNORMAL LOW (ref >60)
GFR calc non Af Amer: 39 mL/min — ABNORMAL LOW (ref >60)
Glucose, Bld: 116 mg/dL — ABNORMAL HIGH (ref 70–99)
Potassium: 5 mmol/L (ref 3.5–5.1)
Sodium: 138 mmol/L (ref 135–145)
Total Bilirubin: 0.4 mg/dL (ref 0.3–1.2)
Total Protein: 6.8 g/dL (ref 6.5–8.1)

## 2019-05-11 LAB — T4, FREE: Free T4: 1.09 ng/dL (ref 0.61–1.12)

## 2019-05-11 NOTE — Progress Notes (Signed)
Date:  05/11/2019   ID:  Nathan Pruitt, DOB 04-Jul-1945, MRN [id number]   Provider location: [facility] Type of Visit: Established patient  PCP:  Ronita Hipps, MD  Cardiologist: Dr. Aundra Dubin  Chief Complaint: Shortness of breath   History of Present Illness: [name] is a 74 y.o. male who has a history of CAD, PAD, ischemic CMP, and VT s/p ICD.  Echo in 2/17 showed EF 40% and regional WMAs.  In 3/18, he developed episodes of VT as well as increased dyspnea.  LHC/RHC was done, showing new occlusion of a large ramus (CTO, probably a month or so old at time of cath) and old CTO RCA with collaterals.  He had DES x 3 to ramus.  Filling pressures were optimized on RHC.  Amiodarone was increased to 200 mg daily from 100 mg daily.  No VT since PCI.   Echo in 6/19 showed EF 35-40% with wall motion abnormalities. Echo was done today and reviewed, EF 40-45% with basal to mid inferolateral and inferior akinesis, mild MR, normal RV, PASP 35 mmHg.   He has been stable recently. Not very active.  Dyspnea walking up a hill but ok on flat ground.  No orthopnea/PND.  Able to get around stores.  No chest pain.    MDT device interrogation: Impedance decreased recently but trending back up, fluid index remains below threshold, no VT/AF.   Labs (11/10): K 4.9, creatinine 1.4, LDL 70, HDL 31, LFTs normal Labs (3/11): K 4.4, creatinine 1.3, LDL 61, HDL 35 Labs (7/11): LDL 62, HDL 23 Labs (1/12): K 4.8, LFTs normal, creatinine 1.44, LDL 69, HDL 33 Labs (7/12): LFTs normal, LDL 81, HDL 36, HCT normal, K 4.3, creatinine 1.3, BNP 116, TSH normal Labs (1/13): LDL 39, HDl 31, LFTs normal, TSH  Labs (7/13): TSH normal, LFTs normal, LDL 69, HDL 40 Labs (8/13): K 4.5, creatinine 1.6 Labs (10/13): K 4.4, creatinine 1.4 Labs (3/14): LDL 108, HDL 30, LFTs normal Labs (9/14): K 4.2, creatinine 1.6, LFTs normal, LDL 114, TSH 10.6 (elevated), free T4/free T3 normal Labs  (2/15): K 4.6, creatinine 1.5, LFTs normal, TSH 7.6 (elevated), free T4 and free T3 normal Labs (3/15): K 3.6, creatinine 1.5, LFTs normal, TSH normal Labs (9/15): K 4.3, creatinine 1.6, LFTs normal, TSH normal, LDL 115, HDL 24 Labs (10/16): K 4.5, creatinine 1.5, LDL 90, HDL 35, hgb 12.8 Labs (11/16): Low free T3, normal TSH and free T4 Labs (12/16): K 5, creatinine 1.8, LFTs normal, TSH 8.6 (mildly elevated) Labs (1/17): Free T4/TSH/free T3 normal, LFTs normal Labs (2/17): K 4.4, creatinine 2.4, LDL 131, HDL 34 Labs (4/17): K 4.2, creatinine 1.3, LDL 67, HDL 35 Labs (9/17): K 4.9, creatinine 2.2, LDL 54, HDL 35, free T3 and T4 normal, hgb 12 Labs (4/18): K 5, creatinine 1.78, LFTs normal, TSH elevated but free T3 and free T4 normal.  Labs (6/18): LDL 52, HDL 35, TSH elevated 6.8, LFTs normal Labs (7/18): K 4.3, creatinine 1.68 Labs (10/18): TSH 5.6, free T3 normal, free T4 normal, LFTs normal, K 4.9, creatinine 1.4 Labs (11/18): K 4.5, creatinine 1.46, LFTs normal Labs (4/19): K 4.8, creatinine 1.6, LFTs normal Labs (6/19): TSH slightly elevated, free T3/T4 both normal, LFTs normal Labs (7/19): K 4.3, creatinine 1.3 Labs (10/19): LFTs normal, LDL 58, TGs 165, TSH 6.6 but free T4 and free T3 normal Labs (1/20): K 4.7, creatinine 1.7 Labs (2/20): LFTs normal Labs (4/20): K 4.6, creatinine  1.4, LDL 77, HDL 36, hgb 12.2, TSH mild elevation 7.4, free T4 and free T3 normal.  Labs (7/20): K 3.8, creatinine 1.6, hgb 12.2, LDL 81, TGs 158, HDL 35, TSH 7.7 (mildly elevated), LFTs normal Labs (11/20): K 4.5, creatinine 1.7, NT-proBNP 1358, TSH 5.6 (mild elevation), LDL 88, LFTs normal  Allergies (verified):  No Known Drug Allergies  Past Medical History: 1. Coronary artery disease. The patient reports history of silent MI in 1993.  This was likely an inferior MI (see below).  - The patient presented to [facility] in 5/10 with VT and mildly elevated cardiac enzymes. LHC (5/10):  Inferobasal  dyskinesis with EF 35-40%.  There was chronic total occlusion of the mid RCA with good collaterals.  Luminals LCA.  This did not appear to be an acute cause of the 5/10 event.  - LHC (3/18): Known occlusion of RCA with collaterals, new occlusion of ramus.  DES x 3 to ramus.  2. Hypertension.  3. Hyperlipidemia: Intolerant of higher doses of simvastatin, Crestor, Lipitor, pravastatin. Zetia caused constipation.  4. Remote tobacco abuse with 47-pack-year history, quitting in August 2009.  5. Peripheral arterial disease.  - Status post left-to-right fem-fem bypass performed in [place] in March 2009.  - Status post redo left-to-right fem-fem bypass performed by Dr. Scot Dock at [facility] in 2009.  - ABIs normal 3/15, ABIs normal 3/16, ABIs normal 3/17, ABIs normal 4/18, ABIs normal 4/19, ABIs normal 12/20.  6. Rheumatoid arthritis, on leflunomide.  7.  Ischemic cardiomyopathy:  EF 35-40% by LV-gram 5/10 with inferobasal dyskinesis.  Echo 5/10 showed EF 40% with mild LVH, no significant MR, inferobasal and posterobasal akinesis.  Echo (7/11): EF 50%, mild LVH, basal-mid inferoposterior akinesis.  Echo (7/12): EF 45-50% with basal anterolateral, basal posterior, and basal to mid inferior akinesis.  Echo (4/16): EF 40-45%, basal to mid inferolateral AK, basal inferior AK, basal to mid anterolateral HK.  Echo (2/17): EF 40% with basal to mid inferior akinesis, basal inferolateral aneurysm, mid inferolateral akinesis, basal anterolateral hypokinesis, normal RV size and systolic function, PASP 25 mmHg.  - Echo (3/18) with EF 30-35%, moderate LV dilation, moderate MR, mildly decreased RV systolic function, PASP 51 mmmHg. - RHC (3/18): mean RA 4, PA 38/12, mean PCWP 17, CI 2.2.  - Echo (6/19): EF 35-40%, inferior/inferolateral/anterolateral WMAs, moderate MR likely infarct-related, normal RV size with mildly decreased systolic function.  - Echo (1/21): EF 35-40% with wall motion abnormalities. Echo was done today  and reviewed, EF 40-45% with basal to mid inferolateral and inferior akinesis, mild MR, normal RV, PASP 35 mmHg.  8.  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  He has a dual chamber Medtronic ICD.  9.  Atrial flutter:  Status post isthmus ablation 5/10.   10.  PFTs (7/11): FVC 74%, FEV1 80%, ratio 75%, TLC 78%, DLCO 68%.  This suggests a mild restrictive defect and a mild obstructive defect.  He did have response to bronchodilator. These PFTs were significantly better than the report from Dr. Alcide Clever in [place] done prior.  He had last PFTs in 2/12 with no significant change.  11.  Anxiety 12.  Chronic cough: No relief with change from ACEI to ARB or with trial of PPI.  13.  CKD: Stage 3.  14. Mitral regurgitation: Moderate on 6/19 echo, suspect infarct-related.  15. AAA: 3.1 cm AAA on abdominal US 12/20   Current Outpatient Medications  Medication Sig Dispense Refill  .  amiodarone (PACERONE) 200 MG tablet TAKE 1 TABLET BY MOUTH DAILY 90 tablet 3  . amoxicillin (AMOXIL) 500 MG capsule Take 500 mg by mouth 2 (two) times daily.    [name] aspirin EC 81 MG tablet Take 1 tablet (81 mg total) by mouth daily.    . carvedilol (COREG) 25 MG tablet Take 1 tablet (25 mg total) by mouth 2 (two) times daily with a meal. 180 tablet 3  . chlorhexidine (PERIDEX) 0.12 % solution Swish and rinse as needed for dental  3  . clopidogrel (PLAVIX) 75 MG tablet TAKE 1 TABLET (75 MG TOTAL) BY MOUTH DAILY WITH BREAKFAST. 90 tablet 2  . eplerenone (INSPRA) 25 MG tablet Take 1 tablet (25 mg total) by mouth daily. 90 tablet 3  . fluticasone (FLONASE) 50 MCG/ACT nasal spray Place 1-2 sprays into both nostrils daily as needed for allergies or rhinitis.    . furosemide (LASIX) 40 MG tablet TAKE 1 TABLET BY MOUTH EVERY DAY 90 tablet 2  . leflunomide (ARAVA) 20 MG tablet Take 20 mg by mouth daily.      . Melatonin 10 MG TABS Take 10 mg by mouth at bedtime as needed (sleep).      . simvastatin (ZOCOR) 20 MG tablet Take 1 tablet (20 mg total) by mouth at bedtime. 90 tablet 3  . valsartan (DIOVAN) 80 MG tablet Take 1 tablet (80 mg total) by mouth 2 (two) times daily. 180 tablet 3   No current facility-administered medications for this encounter.    Allergies:   Atorvastatin, Crestor [rosuvastatin calcium], Losartan, Telmisartan, Enalapril, and Lisinopril   Social History:  The patient  reports that he quit smoking about 12 years ago. He has a 47.00 pack-year smoking history. He has never used smokeless tobacco. He reports current alcohol use. He reports that he does not use drugs.   Family History:  The patient's family history includes Gastric cancer in his mother; Heart attack (age of onset: 32) in his brother; Peripheral vascular disease in his father; Throat cancer in his father.   ROS:  Please see the history of present illness.   All other systems are personally reviewed and negative.   Exam:   BP 110/60   Pulse (!) 50   Wt 84.1 kg (185 lb 6.4 oz)   SpO2 96%   BMI 26.60 kg/m  General: NAD Neck: No JVD, no thyromegaly or thyroid nodule.  Lungs: Clear to auscultation bilaterally with normal respiratory effort. CV: Nondisplaced PMI.  Heart regular S1/S2, no S3/S4, no murmur.  No peripheral edema.  No carotid bruit.  Normal pedal pulses.  Abdomen: Soft, nontender, no hepatosplenomegaly, no distention.  Skin: Intact without lesions or rashes.  Neurologic: Alert and oriented x 3.  Psych: Normal affect. Extremities: No clubbing or cyanosis.  HEENT: Normal.   Recent Labs: 05/11/2019: ALT 15; BUN 32; Creatinine, Ser 1.68; Potassium 5.0; Sodium 138; TSH 6.271  Personally reviewed   Wt Readings from Last 3 Encounters:  05/11/19 84.1 kg (185 lb 6.4 oz)  04/08/19 82.2 kg (181 lb 4.8 oz)  02/18/19 81.6 kg (180 lb)     ASSESSMENT AND PLAN:  1. Chronic systolic CHF: Ischemic cardiomyopathy.  Medtronic ICD.  Echo (1/21) with EF 40-45%, stable. NYHA class II  symptoms. He is not volume overloaded by exam or Optivol.  - Continue Coreg 25 mg bid.  - We have discussed transitioning to [facility], he wants to stay on valsartan (says Delene Loll would send him into donut hole too  fast).  Continue valsartan at 80 mg bid. Creatinine 1.7 when checked in 11/20.  Repeat BMET today.  - He is on eplerenone 25 mg daily.  Says that he became hyperkalemic on eplerenone 50 mg daily so I will not increase any further today.    - Continue Lasix 40 mg daily.  - We discussed starting dapagliflozin 10 mg daily with cardiomyopathy and CKD.  He wants to talk with our HF pharmacist about how his prescription plan will cover this.  Will have the HF pharmacist call him. If he starts dapagliflozin, needs BMET in 10 days.  2. CAD: Denies chest pain.  Had DES x 3 to ramus in 3/18.   - Continue ASA 81 mg daily, clopidogrel, Zocor 20 mg daily, and Coreg/ARB as above.   3. HLD: Stable on simvastatin without myalgias.  Has not been able to tolerate higher dose of simvastatin or other statins. Unable to tolerate Zetia due to constipation.  Recent LDL was 88 which is above goal.  He refuses to start Repatha due to donut hole concerns.   4. Hx of VT: Continue amiodarone 200 mg daily (increased from 100 mg daily after last VT event). Has ICD.  - Subclinical hypothyroidism (has had mildly elevated TSH with normal free T3 and free T4).  Follow TSH and free T3/T4 (check today), if further rise may need low dose Levoxyl. - LFTs today. - Knows to get a yearly eye exam 5. PAD: He denies claudication.  Normal ABIs in 12/20, followed by [facility].   6. AAA: 3.1 cm in 12/20, followed by [facility].  7. CKD: Stage 3.  BMET today.  Think SGLT2 inhibitor would be helpful.   Followup 3 months virtual.   Signed, Loralie Champagne, MD  05/11/2019   [facility] [address] [facility] [place] [address] [phone] (office) [phone] (fax)

## 2019-05-11 NOTE — Patient Instructions (Addendum)
NEW MEDICATION:  Dr Aundra Dubin discussed starting a new medication Farxiga or Jardiance for your heart.  You will get a call from our pharmacist to discuss which one to start.     Labs today We will only contact you if something comes back abnormal or we need to make some changes. Otherwise no news is good news!  Your physician recommends that you schedule a follow-up appointment in: 3 month virtual visit with Dr Aundra Dubin.     At the [facility], you and your health needs are our priority. As part of our continuing mission to provide you with exceptional heart care, we have created designated Provider Care Teams. These Care Teams include your primary Cardiologist (physician) and Advanced Practice Providers (APPs- Physician Assistants and Nurse Practitioners) who all work together to provide you with the care you need, when you need it.   You may see any of the following providers on your designated Care Team at your next follow up: [name] Dr Glori Bickers . Dr Loralie Champagne . Darrick Grinder, NP . Lyda Jester, PA . Audry Riles, PharmD   Please be sure to bring in all your medications bottles to every appointment.

## 2019-05-11 NOTE — Progress Notes (Signed)
  Echocardiogram 2D Echocardiogram has been performed.  Nathan Pruitt 05/11/2019, 9:09 AM

## 2019-05-12 ENCOUNTER — Telehealth (HOSPITAL_COMMUNITY): Payer: Self-pay | Admitting: Pharmacist

## 2019-05-12 LAB — T3, FREE: T3, Free: 2.6 pg/mL (ref 2.0–4.4)

## 2019-05-12 NOTE — Telephone Encounter (Signed)
Called Mr. Smyers to discuss the possibility of starting Farxiga or Jardiance. Both medications are currently $483 because he has not met his deductible. They will still be unaffordable at >$100/month after deductible is met. Additionally, he is not eligible to receive manufacturer's assistance because his annual household income is too large. Because of this, neither medication is affordable at this time and he does not wish to initiate therapy with either. Would reconsider therapy if he changes insurance at a later date.  Lauren Kemp, PharmD, BCPS, BCCP, CPP [facility] Pharmacist [phone]  

## 2019-05-18 ENCOUNTER — Ambulatory Visit (INDEPENDENT_AMBULATORY_CARE_PROVIDER_SITE_OTHER): Payer: Medicare Other | Admitting: *Deleted

## 2019-05-18 ENCOUNTER — Encounter (HOSPITAL_COMMUNITY): Payer: Self-pay

## 2019-05-18 DIAGNOSIS — I472 Ventricular tachycardia, unspecified: Secondary | ICD-10-CM

## 2019-05-18 LAB — CUP PACEART REMOTE DEVICE CHECK
Battery Remaining Longevity: 103 mo
Battery Voltage: 3.01 V
Brady Statistic AP VP Percent: 0.18 %
Brady Statistic AP VS Percent: 15.14 %
Brady Statistic AS VP Percent: 1.4 %
Brady Statistic AS VS Percent: 83.28 %
Brady Statistic RA Percent Paced: 15.21 %
Brady Statistic RV Percent Paced: 1.7 %
Date Time Interrogation Session: 20210112022702
HighPow Impedance: 39 Ohm
HighPow Impedance: 51 Ohm
Implantable Lead Implant Date: 20100520
Implantable Lead Implant Date: 20100520
Implantable Lead Location: 753859
Implantable Lead Location: 753860
Implantable Lead Model: 5076
Implantable Lead Model: 6947
Implantable Pulse Generator Implant Date: 20180710
Lead Channel Impedance Value: 266 Ohm
Lead Channel Impedance Value: 304 Ohm
Lead Channel Impedance Value: 399 Ohm
Lead Channel Pacing Threshold Amplitude: 0.625 V
Lead Channel Pacing Threshold Amplitude: 1.5 V
Lead Channel Pacing Threshold Pulse Width: 0.4 ms
Lead Channel Pacing Threshold Pulse Width: 0.4 ms
Lead Channel Sensing Intrinsic Amplitude: 1.75 mV
Lead Channel Sensing Intrinsic Amplitude: 1.75 mV
Lead Channel Sensing Intrinsic Amplitude: 9.75 mV
Lead Channel Sensing Intrinsic Amplitude: 9.75 mV
Lead Channel Setting Pacing Amplitude: 1.5 V
Lead Channel Setting Pacing Amplitude: 3 V
Lead Channel Setting Pacing Pulse Width: 0.4 ms
Lead Channel Setting Sensing Sensitivity: 0.3 mV

## 2019-05-26 ENCOUNTER — Other Ambulatory Visit: Payer: Self-pay

## 2019-06-12 ENCOUNTER — Other Ambulatory Visit (HOSPITAL_COMMUNITY): Payer: Self-pay | Admitting: Internal Medicine

## 2019-08-09 ENCOUNTER — Other Ambulatory Visit: Payer: Self-pay

## 2019-08-09 ENCOUNTER — Ambulatory Visit (HOSPITAL_COMMUNITY)
Admission: RE | Admit: 2019-08-09 | Discharge: 2019-08-09 | Disposition: A | Discharge status: Home / Self Care | Payer: Medicare Other | Source: Ambulatory Visit | Attending: Cardiology | Admitting: Cardiology

## 2019-08-09 DIAGNOSIS — I5022 Chronic systolic (congestive) heart failure: Secondary | ICD-10-CM

## 2019-08-09 NOTE — Progress Notes (Signed)
Heart Failure TeleHealth Note  Due to national recommendations of social distancing due to [place] 19, Audio/video telehealth visit is felt to be most appropriate for this patient at this time.  See MyChart message from today for patient consent regarding telehealth for [facility].  Date:  08/09/2019   ID:  Nathan Pruitt, DOB 09/05/1945, MRN [id number]  Location: Home  Provider location: [facility] Type of Visit: Established patient   PCP:  Ronita Hipps, MD  Cardiologist: Dr. Aundra Dubin  Chief Complaint: Exertional dyspnea   History of Present Illness: Nathan Pruitt is a 74 y.o. male who presents via audio/video conferencing for a telehealth visit today.     he denies symptoms worrisome for COVID 19.   Patient has a history of CAD, PAD, ischemic CMP, and VT s/p ICD.  Echo in 2/17 showed EF 40% and regional WMAs.  In 3/18, he developed episodes of VT as well as increased dyspnea.  LHC/RHC was done, showing new occlusion of a large ramus (CTO, probably a month or so old at time of cath) and old CTO RCA with collaterals.  He had DES x 3 to ramus.  Filling pressures were optimized on RHC.  Amiodarone was increased to 200 mg daily from 100 mg daily.  No VT since PCI.   Echo in 6/19 showed EF 35-40% with wall motion abnormalities. Echo in 1/21 showed EF 40-45% with basal to mid inferolateral and inferior akinesis, mild MR, normal RV, PASP 35 mmHg.   He is stable symptomatically.  Not very active.  Dyspnea walking up a hill but ok on flat ground.  No chest pain.  No orthopnea/PND.  Can get around [employer] without problems but does not go out much due to COVID-19.  To get his COVID vaccination soon. Weight is down about 2 lbs.   Labs (11/10): K 4.9, creatinine 1.4, LDL 70, HDL 31, LFTs normal Labs (3/11): K 4.4, creatinine 1.3, LDL 61, HDL 35 Labs (7/11): LDL 62, HDL 23 Labs (1/12): K 4.8, LFTs normal, creatinine 1.44, LDL 69, HDL 33 Labs (7/12): LFTs  normal, LDL 81, HDL 36, HCT normal, K 4.3, creatinine 1.3, BNP 116, TSH normal Labs (1/13): LDL 39, HDl 31, LFTs normal, TSH  Labs (7/13): TSH normal, LFTs normal, LDL 69, HDL 40 Labs (8/13): K 4.5, creatinine 1.6 Labs (10/13): K 4.4, creatinine 1.4 Labs (3/14): LDL 108, HDL 30, LFTs normal Labs (9/14): K 4.2, creatinine 1.6, LFTs normal, LDL 114, TSH 10.6 (elevated), free T4/free T3 normal Labs (2/15): K 4.6, creatinine 1.5, LFTs normal, TSH 7.6 (elevated), free T4 and free T3 normal Labs (3/15): K 3.6, creatinine 1.5, LFTs normal, TSH normal Labs (9/15): K 4.3, creatinine 1.6, LFTs normal, TSH normal, LDL 115, HDL 24 Labs (10/16): K 4.5, creatinine 1.5, LDL 90, HDL 35, hgb 12.8 Labs (11/16): Low free T3, normal TSH and free T4 Labs (12/16): K 5, creatinine 1.8, LFTs normal, TSH 8.6 (mildly elevated) Labs (1/17): Free T4/TSH/free T3 normal, LFTs normal Labs (2/17): K 4.4, creatinine 2.4, LDL 131, HDL 34 Labs (4/17): K 4.2, creatinine 1.3, LDL 67, HDL 35 Labs (9/17): K 4.9, creatinine 2.2, LDL 54, HDL 35, free T3 and T4 normal, hgb 12 Labs (4/18): K 5, creatinine 1.78, LFTs normal, TSH elevated but free T3 and free T4 normal.  Labs (6/18): LDL 52, HDL 35, TSH elevated 6.8, LFTs normal Labs (7/18): K 4.3, creatinine 1.68 Labs (10/18): TSH 5.6, free T3 normal, free T4 normal,  LFTs normal, K 4.9, creatinine 1.4 Labs (11/18): K 4.5, creatinine 1.46, LFTs normal Labs (4/19): K 4.8, creatinine 1.6, LFTs normal Labs (6/19): TSH slightly elevated, free T3/T4 both normal, LFTs normal Labs (7/19): K 4.3, creatinine 1.3 Labs (10/19): LFTs normal, LDL 58, TGs 165, TSH 6.6 but free T4 and free T3 normal Labs (1/20): K 4.7, creatinine 1.7 Labs (2/20): LFTs normal Labs (4/20): K 4.6, creatinine 1.4, LDL 77, HDL 36, hgb 12.2, TSH mild elevation 7.4, free T4 and free T3 normal.  Labs (7/20): K 3.8, creatinine 1.6, hgb 12.2, LDL 81, TGs 158, HDL 35, TSH 7.7 (mildly elevated), LFTs normal Labs (11/20): K  4.5, creatinine 1.7, NT-proBNP 1358, TSH 5.6 (mild elevation), LDL 88, LFTs normal Labs (4/21): K 4.3, creatinine 1.6, hgb 12.4, LFTs, normal, TGs 105, LDL 81, HDL 40, TSH 5.6 (mildly elevated), free T3 and free T4 normal  Allergies (verified):  No Known Drug Allergies  Past Medical History: 1. Coronary artery disease. The patient reports history of silent MI in 1993.  This was likely an inferior MI (see below).  - The patient [facility] with VT and mildly elevated cardiac enzymes. LHC (5/10):  Inferobasal dyskinesis with EF 35-40%.  There was chronic total occlusion of the mid RCA with good collaterals.  Luminals LCA.  This did not appear to be an acute cause of the 5/10 event.  - LHC (3/18): Known occlusion of RCA with collaterals, new occlusion of ramus.  DES x 3 to ramus.  2. Hypertension.  3. Hyperlipidemia: Intolerant of higher doses of simvastatin, Crestor, Lipitor, pravastatin. Zetia caused constipation.  4. Remote tobacco abuse with 47-pack-year history, quitting in August 2009.  5. Peripheral arterial disease.  - Status post left-to-right fem-fem bypass performed in [place] in March 2009.  - Status post redo left-to-right fem-fem bypass performed by Dr. Scot Dock [facility] in 2009.  - ABIs normal 3/15, ABIs normal 3/16, ABIs normal 3/17, ABIs normal 4/18, ABIs normal 4/19, ABIs normal 12/20.  6. Rheumatoid arthritis, on leflunomide.  7.  Ischemic cardiomyopathy:  EF 35-40% by LV-gram 5/10 with inferobasal dyskinesis.  Echo 5/10 showed EF 40% with mild LVH, no significant MR, inferobasal and posterobasal akinesis.  Echo (7/11): EF 50%, mild LVH, basal-mid inferoposterior akinesis.  Echo (7/12): EF 45-50% with basal anterolateral, basal posterior, and basal to mid inferior akinesis.  Echo (4/16): EF 40-45%, basal to mid inferolateral AK, basal inferior AK, basal to mid anterolateral HK.  Echo (2/17): EF 40% with basal to mid inferior akinesis, basal inferolateral  aneurysm, mid inferolateral akinesis, basal anterolateral hypokinesis, normal RV size and systolic function, PASP 25 mmHg.  - Echo (3/18) with EF 30-35%, moderate LV dilation, moderate MR, mildly decreased RV systolic function, PASP 51 mmmHg. - RHC (3/18): mean RA 4, PA 38/12, mean PCWP 17, CI 2.2.  - Echo (6/19): EF 35-40%, inferior/inferolateral/anterolateral WMAs, moderate MR likely infarct-related, normal RV size with mildly decreased systolic function.  - Echo (1/21): EF 35-40% with wall motion abnormalities. Echo was done today and reviewed, EF 40-45% with basal to mid inferolateral and inferior akinesis, mild MR, normal RV, PASP 35 mmHg.  8.  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  He has a dual chamber Medtronic ICD.  9.  Atrial flutter:  Status post isthmus ablation 5/10.   10.  PFTs (7/11): FVC 74%, FEV1 80%, ratio 75%, TLC 78%, DLCO 68%.  This suggests a mild restrictive  defect and a mild obstructive defect.  He did have response to bronchodilator. These PFTs were significantly better than the report from Dr. Alcide Clever in [place] done prior.  He had last PFTs in 2/12 with no significant change.  11.  Anxiety 12.  Chronic cough: No relief with change from ACEI to ARB or with trial of PPI.  13.  CKD: Stage 3.  14. Mitral regurgitation: Moderate on 6/19 echo, suspect infarct-related.  15. AAA: 3.1 cm AAA on abdominal US 12/20   Current Outpatient Medications  Medication Sig Dispense Refill  . amiodarone (PACERONE) 200 MG tablet TAKE 1 TABLET BY MOUTH DAILY 90 tablet 3  . amoxicillin (AMOXIL) 500 MG capsule Take 500 mg by mouth 2 (two) times daily.    [name] aspirin EC 81 MG tablet Take 1 tablet (81 mg total) by mouth daily.    . carvedilol (COREG) 25 MG tablet Take 1 tablet (25 mg total) by mouth 2 (two) times daily with a meal. 180 tablet 3  . chlorhexidine (PERIDEX) 0.12 % solution Swish and rinse as needed for dental  3  .  clopidogrel (PLAVIX) 75 MG tablet TAKE 1 TABLET (75 MG TOTAL) BY MOUTH DAILY WITH BREAKFAST. 90 tablet 2  . eplerenone (INSPRA) 25 MG tablet Take 1 tablet (25 mg total) by mouth daily. 90 tablet 3  . fluticasone (FLONASE) 50 MCG/ACT nasal spray Place 1-2 sprays into both nostrils daily as needed for allergies or rhinitis.    . furosemide (LASIX) 40 MG tablet TAKE 1 TABLET BY MOUTH EVERY DAY 90 tablet 2  . leflunomide (ARAVA) 20 MG tablet Take 20 mg by mouth daily.      . Melatonin 10 MG TABS Take 10 mg by mouth at bedtime as needed (sleep).     . simvastatin (ZOCOR) 20 MG tablet Take 1 tablet (20 mg total) by mouth at bedtime. 90 tablet 3  . valsartan (DIOVAN) 80 MG tablet TAKE 1 TABLET (80 MG TOTAL) BY MOUTH 2 (TWO) TIMES DAILY. 180 tablet 3   No current facility-administered medications for this encounter.    Allergies:   Atorvastatin, Crestor [rosuvastatin calcium], Losartan, Telmisartan, Enalapril, and Lisinopril   Social History:  The patient  reports that he quit smoking about 12 years ago. He has a 47.00 pack-year smoking history. He has never used smokeless tobacco. He reports current alcohol use. He reports that he does not use drugs.   Family History:  The patient's family history includes Gastric cancer in his mother; Heart attack (age of onset: 85) in his brother; Peripheral vascular disease in his father; Throat cancer in his father.   ROS:  Please see the history of present illness.   All other systems are personally reviewed and negative.   Exam:  (Video/Tele Health Call; Exam is subjective and or/visual.) BP 115/66, HR 54, wt 183 General:  Speaks in full sentences. No resp difficulty. Lungs: Normal respiratory effort with conversation.  Abdomen: Non-distended per patient report Extremities: Pt denies edema. Neuro: Alert & oriented x 3.   Recent Labs: 05/11/2019: ALT 15; BUN 32; Creatinine, Ser 1.68; Potassium 5.0; Sodium 138; TSH 6.271  Personally reviewed   Wt Readings  from Last 3 Encounters:  05/11/19 84.1 kg (185 lb 6.4 oz)  04/08/19 82.2 kg (181 lb 4.8 oz)  02/18/19 81.6 kg (180 lb)     ASSESSMENT AND PLAN:  1. Chronic systolic CHF: Ischemic cardiomyopathy.  Medtronic ICD.  Echo (1/21) with EF 40-45%, stable. NYHA class II symptoms.  He is not volume overloaded by exam or Optivol.  - Continue Coreg 25 mg bid.  - We have discussed transitioning to [facility], he wants to stay on valsartan (says Delene Loll would send him into donut hole too fast).  Continue valsartan at 80 mg bid. Creatinine 1.6 on recent check, this is stable.  - He is on eplerenone 25 mg daily.  Says that he became hyperkalemic on eplerenone 50 mg daily so I will not increase any further today.    - Continue Lasix 40 mg daily.  - He does not get good coverage for SGLT2 inhibitor.   2. CAD: Denies chest pain.  Had DES x 3 to ramus in 3/18.   - Continue ASA 81 mg daily, clopidogrel, Zocor 20 mg daily, and Coreg/ARB as above.   3. HLD: Stable on simvastatin without myalgias.  Has not been able to tolerate higher dose of simvastatin or other statins. Unable to tolerate Zetia due to constipation.  Recent LDL was 81 which is above goal.  He refuses to start Repatha due to donut hole concerns.   4. Hx of VT: Continue amiodarone 200 mg daily (increased from 100 mg daily after last VT event). Has ICD.  - Subclinical hypothyroidism (has had mildly elevated TSH with normal free T3 and free T4, recently checked).   - Recent LFTs normal.  - Knows to get a yearly eye exam 5. PAD: He denies claudication.  Normal ABIs in 12/20, followed by [facility].   6. AAA: 3.1 cm in 12/20, followed by [facility].  7. CKD: Stage 3.  Creatinine stable at 1.6 recently.    COVID screen The patient does not have any symptoms that suggest any further testing/ screening at this time.  Social distancing reinforced today.  Patient Risk: After full review of this patients clinical status, I feel that they are at moderate risk for cardiac  decompensation at this time.  Relevant cardiac medications were reviewed at length with the patient today. The patient does not have concerns regarding their medications at this time.   Recommended follow-up:  3 months, virtual  Today, I have spent 16 minutes with the patient with telehealth technology discussing the above issues .    Signed, Loralie Champagne, MD  08/09/2019  [place] [address] [facility] [place] [address] [phone] (office) [phone] (fax)

## 2019-08-17 ENCOUNTER — Ambulatory Visit (INDEPENDENT_AMBULATORY_CARE_PROVIDER_SITE_OTHER): Payer: Medicare Other | Admitting: *Deleted

## 2019-08-17 DIAGNOSIS — I472 Ventricular tachycardia, unspecified: Secondary | ICD-10-CM

## 2019-08-17 LAB — CUP PACEART REMOTE DEVICE CHECK
Battery Remaining Longevity: 100 mo
Battery Voltage: 3.01 V
Brady Statistic AP VP Percent: 0.92 %
Brady Statistic AP VS Percent: 14.1 %
Brady Statistic AS VP Percent: 2.26 %
Brady Statistic AS VS Percent: 82.72 %
Brady Statistic RA Percent Paced: 14.93 %
Brady Statistic RV Percent Paced: 3.33 %
Date Time Interrogation Session: 20210413033522
HighPow Impedance: 36 Ohm
HighPow Impedance: 46 Ohm
Implantable Lead Implant Date: 20100520
Implantable Lead Implant Date: 20100520
Implantable Lead Location: 753859
Implantable Lead Location: 753860
Implantable Lead Model: 5076
Implantable Lead Model: 6947
Implantable Pulse Generator Implant Date: 20180710
Lead Channel Impedance Value: 266 Ohm
Lead Channel Impedance Value: 323 Ohm
Lead Channel Impedance Value: 380 Ohm
Lead Channel Pacing Threshold Amplitude: 0.625 V
Lead Channel Pacing Threshold Amplitude: 1.25 V
Lead Channel Pacing Threshold Pulse Width: 0.4 ms
Lead Channel Pacing Threshold Pulse Width: 0.4 ms
Lead Channel Sensing Intrinsic Amplitude: 1.875 mV
Lead Channel Sensing Intrinsic Amplitude: 1.875 mV
Lead Channel Sensing Intrinsic Amplitude: 5.125 mV
Lead Channel Sensing Intrinsic Amplitude: 5.125 mV
Lead Channel Setting Pacing Amplitude: 1.5 V
Lead Channel Setting Pacing Amplitude: 3.5 V
Lead Channel Setting Pacing Pulse Width: 0.4 ms
Lead Channel Setting Sensing Sensitivity: 0.3 mV

## 2019-08-18 NOTE — Progress Notes (Signed)
ICD Remote  

## 2019-09-07 ENCOUNTER — Other Ambulatory Visit (HOSPITAL_COMMUNITY): Payer: Self-pay | Admitting: Cardiology

## 2019-11-06 ENCOUNTER — Other Ambulatory Visit (HOSPITAL_COMMUNITY): Payer: Self-pay | Admitting: Cardiology

## 2019-11-06 DIAGNOSIS — I472 Ventricular tachycardia, unspecified: Secondary | ICD-10-CM

## 2019-11-09 ENCOUNTER — Ambulatory Visit (HOSPITAL_COMMUNITY)
Admission: RE | Admit: 2019-11-09 | Discharge: 2019-11-09 | Disposition: A | Discharge status: Home / Self Care | Payer: Medicare Other | Source: Ambulatory Visit | Attending: Cardiology | Admitting: Cardiology

## 2019-11-09 ENCOUNTER — Other Ambulatory Visit: Payer: Self-pay

## 2019-11-09 DIAGNOSIS — I5022 Chronic systolic (congestive) heart failure: Secondary | ICD-10-CM

## 2019-11-09 NOTE — Patient Instructions (Signed)
Lab work is needed in 2 months, I have mailed you a prescription for this so you can have it done locally  Our office will call you to schedule an in-person follow up appointment in 4 month  If you have any questions or concerns before your next appointment please send [place] a message through [place] or call our office at [phone].    TO LEAVE A MESSAGE FOR THE NURSE SELECT OPTION 2, PLEASE LEAVE A MESSAGE INCLUDING: . YOUR NAME . DATE OF BIRTH . CALL BACK NUMBER . REASON FOR CALL**this is important as we prioritize the call backs  YOU WILL RECEIVE A CALL BACK THE SAME DAY AS LONG AS YOU CALL BEFORE 4:00 PM

## 2019-11-09 NOTE — Progress Notes (Signed)
Heart Failure TeleHealth Note  Due to national recommendations of social distancing due to [place] 19, Audio/video telehealth visit is felt to be most appropriate for this patient at this time.  See MyChart message from today for patient consent regarding telehealth for [facility].  Date:  08/09/2019   ID:  Nathan Pruitt, DOB [birth year], MRN [id number]  Location: Home  Provider location: [place] [facility] Type of Visit: Established patient   PCP:  Ronita Hipps, MD  Cardiologist: Dr. Aundra Dubin  Chief Complaint: Exertional dyspnea   History of Present Illness: Nathan Pruitt is a 74 y.o. male who presents via audio/video conferencing for a telehealth visit today.     he denies symptoms worrisome for COVID 19.   Patient has a history of CAD, PAD, ischemic CMP, and VT s/p ICD.  Echo in 2/17 showed EF 40% and regional WMAs.  In 3/18, he developed episodes of VT as well as increased dyspnea.  LHC/RHC was done, showing new occlusion of a large ramus (CTO, probably a month or so old at time of cath) and old CTO RCA with collaterals.  He had DES x 3 to ramus.  Filling pressures were optimized on RHC.  Amiodarone was increased to 200 mg daily from 100 mg daily.  No VT since PCI.   Echo in 6/19 showed EF 35-40% with wall motion abnormalities. Echo in 1/21 showed EF 40-45% with basal to mid inferolateral and inferior akinesis, mild MR, normal RV, PASP 35 mmHg.   Weight stable on home scale. Stable dyspnea, short of breath walking up a hill or with moderate exertion.  No change for years.  No chest pain.  No orthopnea/PND.  Not very active.  No edema. No palpitations.   Labs (11/10): K 4.9, creatinine 1.4, LDL 70, HDL 31, LFTs normal Labs (3/11): K 4.4, creatinine 1.3, LDL 61, HDL 35 Labs (7/11): LDL 62, HDL 23 Labs (1/12): K 4.8, LFTs normal, creatinine 1.44, LDL 69, HDL 33 Labs (7/12): LFTs normal, LDL 81, HDL 36, HCT normal, K 4.3, creatinine 1.3, BNP 116, TSH  normal Labs (1/13): LDL 39, HDl 31, LFTs normal, TSH  Labs (7/13): TSH normal, LFTs normal, LDL 69, HDL 40 Labs (8/13): K 4.5, creatinine 1.6 Labs (10/13): K 4.4, creatinine 1.4 Labs (3/14): LDL 108, HDL 30, LFTs normal Labs (9/14): K 4.2, creatinine 1.6, LFTs normal, LDL 114, TSH 10.6 (elevated), free T4/free T3 normal Labs (2/15): K 4.6, creatinine 1.5, LFTs normal, TSH 7.6 (elevated), free T4 and free T3 normal Labs (3/15): K 3.6, creatinine 1.5, LFTs normal, TSH normal Labs (9/15): K 4.3, creatinine 1.6, LFTs normal, TSH normal, LDL 115, HDL 24 Labs (10/16): K 4.5, creatinine 1.5, LDL 90, HDL 35, hgb 12.8 Labs (11/16): Low free T3, normal TSH and free T4 Labs (12/16): K 5, creatinine 1.8, LFTs normal, TSH 8.6 (mildly elevated) Labs (1/17): Free T4/TSH/free T3 normal, LFTs normal Labs (2/17): K 4.4, creatinine 2.4, LDL 131, HDL 34 Labs (4/17): K 4.2, creatinine 1.3, LDL 67, HDL 35 Labs (9/17): K 4.9, creatinine 2.2, LDL 54, HDL 35, free T3 and T4 normal, hgb 12 Labs (4/18): K 5, creatinine 1.78, LFTs normal, TSH elevated but free T3 and free T4 normal.  Labs (6/18): LDL 52, HDL 35, TSH elevated 6.8, LFTs normal Labs (7/18): K 4.3, creatinine 1.68 Labs (10/18): TSH 5.6, free T3 normal, free T4 normal, LFTs normal, K 4.9, creatinine 1.4 Labs (11/18): K 4.5, creatinine 1.46, LFTs normal Labs (4/19):  K 4.8, creatinine 1.6, LFTs normal Labs (6/19): TSH slightly elevated, free T3/T4 both normal, LFTs normal Labs (7/19): K 4.3, creatinine 1.3 Labs (10/19): LFTs normal, LDL 58, TGs 165, TSH 6.6 but free T4 and free T3 normal Labs (1/20): K 4.7, creatinine 1.7 Labs (2/20): LFTs normal Labs (4/20): K 4.6, creatinine 1.4, LDL 77, HDL 36, hgb 12.2, TSH mild elevation 7.4, free T4 and free T3 normal.  Labs (7/20): K 3.8, creatinine 1.6, hgb 12.2, LDL 81, TGs 158, HDL 35, TSH 7.7 (mildly elevated), LFTs normal Labs (11/20): K 4.5, creatinine 1.7, NT-proBNP 1358, TSH 5.6 (mild elevation), LDL 88,  LFTs normal Labs (4/21): K 4.3, creatinine 1.6, hgb 12.4, LFTs, normal, TGs 105, LDL 81, HDL 40, TSH 5.6 (mildly elevated), free T3 and free T4 normal Labs (6/21): K 4.7, creatinine 1.9, AST/ALT normal, TSH mildly elevated at 7.9, free T3 and T4 normal, LDL 87, TGs 118  Allergies (verified):  No Known Drug Allergies  Past Medical History: 1. Coronary artery disease. The patient reports history of silent MI in 1993.  This was likely an inferior MI (see below).  - The patient presented to [facility] in 5/10 with VT and mildly elevated cardiac enzymes. LHC (5/10):  Inferobasal dyskinesis with EF 35-40%.  There was chronic total occlusion of the mid RCA with good collaterals.  Luminals LCA.  This did not appear to be an acute cause of the 5/10 event.  - LHC (3/18): Known occlusion of RCA with collaterals, new occlusion of ramus.  DES x 3 to ramus.  2. Hypertension.  3. Hyperlipidemia: Intolerant of higher doses of simvastatin, Crestor, Lipitor, pravastatin. Zetia caused constipation.  4. Remote tobacco abuse with 47-pack-year history, quitting in August 2009.  5. Peripheral arterial disease.  - Status post left-to-right fem-fem bypass performed in [place] in March 2009.  - Status post redo left-to-right fem-fem bypass performed by Dr. Scot Dock at [facility] in 2009.  - ABIs normal 3/15, ABIs normal 3/16, ABIs normal 3/17, ABIs normal 4/18, ABIs normal 4/19, ABIs normal 12/20.  6. Rheumatoid arthritis, on leflunomide.  7.  Ischemic cardiomyopathy:  EF 35-40% by LV-gram 5/10 with inferobasal dyskinesis.  Echo 5/10 showed EF 40% with mild LVH, no significant MR, inferobasal and posterobasal akinesis.  Echo (7/11): EF 50%, mild LVH, basal-mid inferoposterior akinesis.  Echo (7/12): EF 45-50% with basal anterolateral, basal posterior, and basal to mid inferior akinesis.  Echo (4/16): EF 40-45%, basal to mid inferolateral AK, basal inferior AK, basal to mid anterolateral HK.  Echo (2/17): EF 40% with  basal to mid inferior akinesis, basal inferolateral aneurysm, mid inferolateral akinesis, basal anterolateral hypokinesis, normal RV size and systolic function, PASP 25 mmHg.  - Echo (3/18) with EF 30-35%, moderate LV dilation, moderate MR, mildly decreased RV systolic function, PASP 51 mmmHg. - RHC (3/18): mean RA 4, PA 38/12, mean PCWP 17, CI 2.2.  - Echo (6/19): EF 35-40%, inferior/inferolateral/anterolateral WMAs, moderate MR likely infarct-related, normal RV size with mildly decreased systolic function.  - Echo (1/21): EF 35-40% with wall motion abnormalities. Echo was done today and reviewed, EF 40-45% with basal to mid inferolateral and inferior akinesis, mild MR, normal RV, PASP 35 mmHg.  8.  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  He has a dual chamber Medtronic ICD.  9.  Atrial flutter:  Status post isthmus ablation 5/10.   10.  PFTs (7/11): FVC 74%, FEV1 80%, ratio 75%, TLC 78%, DLCO 68%.  This suggests a mild restrictive defect and a mild obstructive defect.  He did have response to bronchodilator. These PFTs were significantly better than the report from Dr. Alcide Clever in [place] done prior.  He had last PFTs in 2/12 with no significant change.  11.  Anxiety 12.  Chronic cough: No relief with change from ACEI to ARB or with trial of PPI.  13.  CKD: Stage 3.  14. Mitral regurgitation: Moderate on 6/19 echo, suspect infarct-related.  15. AAA: 3.1 cm AAA on abdominal US 12/20   Current Outpatient Medications  Medication Sig Dispense Refill  . amiodarone (PACERONE) 200 MG tablet TAKE 1 TABLET BY MOUTH EVERY DAY 90 tablet 3  . amoxicillin (AMOXIL) 500 MG capsule Take 500 mg by mouth 2 (two) times daily.    [name] aspirin EC 81 MG tablet Take 1 tablet (81 mg total) by mouth daily.    . carvedilol (COREG) 25 MG tablet Take 1 tablet (25 mg total) by mouth 2 (two) times daily with a meal. 180 tablet 3  . chlorhexidine (PERIDEX) 0.12 %  solution Swish and rinse as needed for dental  3  . clopidogrel (PLAVIX) 75 MG tablet TAKE 1 TABLET (75 MG TOTAL) BY MOUTH DAILY WITH BREAKFAST. 90 tablet 2  . eplerenone (INSPRA) 25 MG tablet TAKE 1 TABLET BY MOUTH EVERY DAY 90 tablet 3  . fluticasone (FLONASE) 50 MCG/ACT nasal spray Place 1-2 sprays into both nostrils daily as needed for allergies or rhinitis.    . furosemide (LASIX) 40 MG tablet TAKE 1 TABLET BY MOUTH EVERY DAY 90 tablet 2  . leflunomide (ARAVA) 20 MG tablet Take 20 mg by mouth daily.      . Melatonin 10 MG TABS Take 10 mg by mouth at bedtime as needed (sleep).     . simvastatin (ZOCOR) 20 MG tablet Take 1 tablet (20 mg total) by mouth at bedtime. 90 tablet 3  . valsartan (DIOVAN) 80 MG tablet TAKE 1 TABLET (80 MG TOTAL) BY MOUTH 2 (TWO) TIMES DAILY. 180 tablet 3   No current facility-administered medications for this encounter.    Allergies:   Atorvastatin, Crestor [rosuvastatin calcium], Losartan, Telmisartan, Enalapril, and Lisinopril   Social History:  The patient  reports that he quit smoking about 12 years ago. He has a 47.00 pack-year smoking history. He has never used smokeless tobacco. He reports current alcohol use. He reports that he does not use drugs.   Family History:  The patient's family history includes Gastric cancer in his mother; Heart attack (age of onset: 81) in his brother; Peripheral vascular disease in his father; Throat cancer in his father.   ROS:  Please see the history of present illness.   All other systems are personally reviewed and negative.   Exam:  (Video/Tele Health Call; Exam is subjective and or/visual.) BP 120/75, HR 75 General:  Speaks in full sentences. No resp difficulty. Neck: No JVD Lungs: Normal respiratory effort with conversation.  Abdomen: Non-distended per patient report Extremities:No edema. Neuro: Alert & oriented x 3.   Recent Labs: 05/11/2019: ALT 15; BUN 32; Creatinine, Ser 1.68; Potassium 5.0; Sodium 138; TSH  6.271  Personally reviewed   Wt Readings from Last 3 Encounters:  05/11/19 84.1 kg (185 lb 6.4 oz)  04/08/19 82.2 kg (181 lb 4.8 oz)  02/18/19 81.6 kg (180 lb)     ASSESSMENT AND PLAN:  1. Chronic systolic CHF: Ischemic cardiomyopathy.  Medtronic ICD.  Echo (1/21) with EF 40-45%, stable. NYHA  class II symptoms. He is not volume overloaded by limited visual exam. Weight stable.  - Continue Coreg 25 mg bid.  - We have discussed transitioning to [facility], he wants to stay on valsartan (says Delene Loll would send him into donut hole too fast).  Continue valsartan at 80 mg bid. Creatinine 1.9 on recent check, mildly higher than prior.   - He is on eplerenone 25 mg daily.  He became hyperkalemic on eplerenone 50 mg daily so I will not increase any further today.    - Continue Lasix 40 mg daily.  - He does not get good coverage for SGLT2 inhibitor.   2. CAD: Denies chest pain.  Had DES x 3 to ramus in 3/18.   - Continue ASA 81 mg daily, clopidogrel, Zocor 20 mg daily, and Coreg/ARB as above.   3. Hyperlipidemia: Stable on simvastatin without myalgias.  Has not been able to tolerate higher dose of simvastatin or other statins. Unable to tolerate Zetia due to constipation.  Recent LDL was 87 which is above goal.  He refuses to start Repatha due to donut hole concerns.   4. Hx of VT: Continue amiodarone 200 mg daily (increased from 100 mg daily after last VT event). Has ICD.  - Subclinical hypothyroidism (has had mildly elevated TSH with normal free T3 and free T4, recently checked).   - Recent LFTs normal.  - Knows to get a yearly eye exam 5. PAD: He denies claudication.  Normal ABIs in 12/20, followed by [facility].   6. AAA: 3.1 cm in 12/20, followed by [facility].  7. CKD: Stage 3.  Creatinine stable at 1.9 recently.    COVID screen The patient does not have any symptoms that suggest any further testing/ screening at this time.  Social distancing reinforced today.  Patient Risk: After full review of this  patients clinical status, I feel that they are at moderate risk for cardiac decompensation at this time.  Relevant cardiac medications were reviewed at length with the patient today. The patient does not have concerns regarding their medications at this time.   Recommended follow-up:  4 months in office, will get BMET in 2 months.   Today, I have spent 17 minutes with the patient with telehealth technology discussing the above issues .    Signed, Loralie Champagne, MD  11/09/2019  Echo [address] [facility] [place] [address] [phone] (office) [phone] (fax)

## 2019-11-09 NOTE — Progress Notes (Signed)
Per Dr Aundra Dubin pt needs a CMET in 2 months and f/u appt in-person in 4 months. Order for lab mailed to patient. AVS sent via mychart.

## 2019-11-16 ENCOUNTER — Other Ambulatory Visit (HOSPITAL_COMMUNITY): Payer: Self-pay | Admitting: Cardiology

## 2019-11-16 ENCOUNTER — Ambulatory Visit (INDEPENDENT_AMBULATORY_CARE_PROVIDER_SITE_OTHER): Payer: Medicare Other | Admitting: *Deleted

## 2019-11-16 DIAGNOSIS — I472 Ventricular tachycardia, unspecified: Secondary | ICD-10-CM

## 2019-11-16 LAB — CUP PACEART REMOTE DEVICE CHECK
Battery Remaining Longevity: 96 mo
Battery Voltage: 3 V
Brady Statistic AP VP Percent: 1.18 %
Brady Statistic AP VS Percent: 13.31 %
Brady Statistic AS VP Percent: 6.86 %
Brady Statistic AS VS Percent: 78.65 %
Brady Statistic RA Percent Paced: 14.33 %
Brady Statistic RV Percent Paced: 8.05 %
Date Time Interrogation Session: 20210713001704
HighPow Impedance: 40 Ohm
HighPow Impedance: 52 Ohm
Implantable Lead Implant Date: 20100520
Implantable Lead Implant Date: 20100520
Implantable Lead Location: 753859
Implantable Lead Location: 753860
Implantable Lead Model: 5076
Implantable Lead Model: 6947
Implantable Pulse Generator Implant Date: 20180710
Lead Channel Impedance Value: 266 Ohm
Lead Channel Impedance Value: 323 Ohm
Lead Channel Impedance Value: 380 Ohm
Lead Channel Pacing Threshold Amplitude: 0.5 V
Lead Channel Pacing Threshold Amplitude: 1.125 V
Lead Channel Pacing Threshold Pulse Width: 0.4 ms
Lead Channel Pacing Threshold Pulse Width: 0.4 ms
Lead Channel Sensing Intrinsic Amplitude: 2 mV
Lead Channel Sensing Intrinsic Amplitude: 2 mV
Lead Channel Sensing Intrinsic Amplitude: 6.25 mV
Lead Channel Sensing Intrinsic Amplitude: 6.25 mV
Lead Channel Setting Pacing Amplitude: 1.5 V
Lead Channel Setting Pacing Amplitude: 2.75 V
Lead Channel Setting Pacing Pulse Width: 0.4 ms
Lead Channel Setting Sensing Sensitivity: 0.3 mV

## 2019-11-17 ENCOUNTER — Telehealth: Payer: Self-pay

## 2019-11-17 NOTE — Progress Notes (Signed)
Remote ICD transmission.   

## 2019-11-17 NOTE — Telephone Encounter (Signed)
Answered pt questions regarding results of remote transmission.  Pt concerned with NSVT episode.  Pt indicates on July1st he had a feeling like he was shocked.  Advised the report does not show his ICD discharging.

## 2019-12-27 ENCOUNTER — Other Ambulatory Visit: Payer: Self-pay

## 2019-12-27 ENCOUNTER — Encounter: Payer: Self-pay | Admitting: Internal Medicine

## 2019-12-27 ENCOUNTER — Telehealth (INDEPENDENT_AMBULATORY_CARE_PROVIDER_SITE_OTHER): Payer: Medicare Other | Admitting: Internal Medicine

## 2019-12-27 VITALS — BP 103/62 | HR 58 | Ht 70.0 in | Wt 184.0 lb

## 2019-12-27 DIAGNOSIS — I5022 Chronic systolic (congestive) heart failure: Secondary | ICD-10-CM

## 2019-12-27 DIAGNOSIS — I472 Ventricular tachycardia, unspecified: Secondary | ICD-10-CM

## 2019-12-27 DIAGNOSIS — I2510 Atherosclerotic heart disease of native coronary artery without angina pectoris: Secondary | ICD-10-CM | POA: Diagnosis not present

## 2019-12-27 NOTE — Progress Notes (Signed)
Electrophysiology TeleHealth Note   Due to national recommendations of social distancing due to COVID 19, an audio/video telehealth visit is felt to be most appropriate for this patient at this time.  See MyChart message from today for the patient's consent to telehealth for [facility].  Date:  12/27/2019   ID:  Nathan Pruitt, DOB 09/14/1945, MRN [id number]  Location: patient's home  Provider location:  [place]  Evaluation Performed: Follow-up visit  PCP:  Ronita Hipps, MD   Electrophysiologist:  Dr Rayann Heman  Chief Complaint:  palpitations  History of Present Illness:    Nathan Pruitt is a 74 y.o. male who presents via telehealth conferencing today.  Since last being seen in our clinic, the patient reports doing very well.  Today, he denies symptoms of palpitations, chest pain, shortness of breath,  lower extremity edema, dizziness, presyncope, or syncope.  The patient is otherwise without complaint today.   Past Medical History:  Diagnosis Date  . Abnormal PFTs 7/11   FVC 74%, FEV1 80%, ratio 75%, TLC 78%, DLCO 68%. this suggests a mild restrictive and obstructive defect. pt did have a response to bronchodilator. these PFTs were significantly better than the report from Dr. Alcide Clever in [place] done prior.   [name] AICD (automatic cardioverter/defibrillator) present   . Anxiety   . Atrial flutter ([place])    s/p isthmus ablation 5/10  . CAD (coronary artery disease)    hx of silent MI in 1993. likely an inferior MI. hx of 2D cardiogram in 3/09 showing EF of 40%. hx of myoview in [place] 3/09-nml. presented to [facility] 5/10 with VT and mildly elevated cardiac enzymes LHC (5/10): inferobasal dyskinesis with EF 35-40%. was chronic total occlusion of mid RCA with good collaterals. luminals LCA. this does not appear to be an acute cause of the 5/10 event  . Cancer ([place])    skin  . CHF (congestive heart failure) ([place])   . Chronic lower back pain   . HLD (hyperlipidemia)   . HTN  (hypertension)   . Ischemic cardiomyopathy    EF 35-40% by LV-gram 5/10 with inferobasal dyskinesis. echo 5/10 showed EF 40% w/mild LVH, no sig. MR, inferobasal and posterobasal akinesis. echo (7/11): EF 50%, mild LVH, basal-mid inferoposterior akenesis  . Migraine    "visual migraine; maybe 12/year" (07/16/2016)  . PAD (peripheral artery disease) (H[facility])    s/p L-to-R fem-fem bypass performed by Dr. Scot Dock at [facility] in 2009   . Rheumatoid arthritis (H[facility])    on leflunomide  . Silent myocardial infarction [facility]) 1993   "silent"  . Tobacco abuse    47 pack year hx; quit 8/09  . Ventricular tachycardia ([place])    likely scar-mediated. 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 He has duel chamber Medtronic ICD    Past Surgical History:  Procedure Laterality Date  . CARDIAC DEFIBRILLATOR PLACEMENT  09/22/2008  . CATARACT EXTRACTION W/ INTRAOCULAR LENS  IMPLANT, BILATERAL Bilateral 05/2016 - 06/2016  . CORONARY CTO INTERVENTION  07/16/2016  . CORONARY CTO INTERVENTION N/A 07/16/2016   Procedure: Coronary CTO Intervention;  Surgeon: Belva Crome, MD;  Location: [place] [facility];  Service: Cardiovascular;  Laterality: N/A;  . EP IMPLANTABLE DEVICE  09/22/08   Medtronic, ICD Model Number:  D274DRG, ICD Serial Number: [id number]  . FEMORAL ARTERY - FEMORAL ARTERY BYPASS GRAFT  march 2009   left to right bypass, first @ [facility], second at [facility] by Dr Scot Dock  .  ICD GENERATOR CHANGEOUT N/A 11/12/2016   Procedure: ICD Generator Changeout;  Surgeon: Thompson Grayer, MD;  Location: [place] [facility];  Service: Cardiovascular;  Laterality: N/A;  . RIGHT/LEFT HEART CATH AND CORONARY ANGIOGRAPHY N/A 07/12/2016   Procedure: Right/Left Heart Cath and Coronary Angiography;  Surgeon: Larey Dresser, MD;  Location: [place] [facility];  Service: Cardiovascular;  Laterality: N/A;  . TONSILLECTOMY    . VASECTOMY    . VENTRICULAR ABLATION SURGERY  09/2008    Current Outpatient  Medications  Medication Sig Dispense Refill  . amiodarone (PACERONE) 200 MG tablet TAKE 1 TABLET BY MOUTH EVERY DAY 90 tablet 3  . amoxicillin (AMOXIL) 500 MG capsule Take 500 mg by mouth 2 (two) times daily.    [name] aspirin EC 81 MG tablet Take 1 tablet (81 mg total) by mouth daily.    . carvedilol (COREG) 25 MG tablet Take 1 tablet (25 mg total) by mouth 2 (two) times daily with a meal. 180 tablet 3  . chlorhexidine (PERIDEX) 0.12 % solution Swish and rinse as needed for dental  3  . clopidogrel (PLAVIX) 75 MG tablet TAKE 1 TABLET (75 MG TOTAL) BY MOUTH DAILY WITH BREAKFAST. 90 tablet 2  . eplerenone (INSPRA) 25 MG tablet TAKE 1 TABLET BY MOUTH EVERY DAY 90 tablet 3  . fluticasone (FLONASE) 50 MCG/ACT nasal spray Place 1-2 sprays into both nostrils daily as needed for allergies or rhinitis.    . furosemide (LASIX) 40 MG tablet TAKE 1 TABLET BY MOUTH EVERY DAY 90 tablet 2  . leflunomide (ARAVA) 20 MG tablet Take 20 mg by mouth daily.      . Melatonin 10 MG TABS Take 10 mg by mouth at bedtime as needed (sleep).     . simvastatin (ZOCOR) 20 MG tablet Take 1 tablet (20 mg total) by mouth at bedtime. 90 tablet 3  . valsartan (DIOVAN) 80 MG tablet TAKE 1 TABLET (80 MG TOTAL) BY MOUTH 2 (TWO) TIMES DAILY. 180 tablet 3   No current facility-administered medications for this visit.    Allergies:   Atorvastatin, Crestor [rosuvastatin calcium], Losartan, Telmisartan, Enalapril, and Lisinopril   Social History:  The patient  reports that he quit smoking about 12 years ago. He has a 47.00 pack-year smoking history. He has never used smokeless tobacco. He reports current alcohol use. He reports that he does not use drugs.   ROS:  Please see the history of present illness.   All other systems are personally reviewed and negative.    Exam:    Vital Signs:  BP 103/62   Pulse (!) 58   Ht 5\' 10"  (1.778 m)   Wt 184 lb (83.5 kg)   BMI 26.40 kg/m   Well sounding and appearing, alert and conversant,  regular work of breathing,  good skin color Eyes- anicteric, neuro- grossly intact, skin- no apparent rash or lesions or cyanosis, mouth- oral mucosa is pink  Labs/Other Tests and Data Reviewed:    Recent Labs: 05/11/2019: ALT 15; BUN 32; Creatinine, Ser 1.68; Potassium 5.0; Sodium 138; TSH 6.271   Wt Readings from Last 3 Encounters:  12/27/19 184 lb (83.5 kg)  05/11/19 185 lb 6.4 oz (84.1 kg)  04/08/19 181 lb 4.8 oz (82.2 kg)     Last device remote is reviewed from [place] PDF which reveals normal device function, no arrhythmias   Echo 05/11/19- EF 40-45%, mild MR   ASSESSMENT & PLAN:    1.  VT Doing very well  with amiodarone No recent episodes of VT Normal ICD function We discussed the importance of regular follow-up to avoid toxicity with amiodarone. Labs 6/21   2. Chronic systolic dysfunction/ ischemic CM/ CAD Stable NYHA Class II CHF Continue medical therapy Diuresis is limited by renal failure He follows with Dr Aundra Dubin  3. HTN Stable No change required today  4. Elevated TSH Chronic T4 has been normal I have again advised that he follow with primary care with this  Risks, benefits and potential toxicities for medications prescribed and/or refilled reviewed with patient today.   Follow-up:  Follow-up with Dr Aundra Dubin as scheduled Return to see EP PA in a year   Patient Risk:  after full review of this patients clinical status, I feel that they are at moderate risk at this time.  Today, I have spent 15 minutes with the patient with telehealth technology discussing arrhythmia management .    Army Fossa, MD  12/27/2019 2:06 PM     [place] [address] [phone] (office) [phone] (fax)

## 2020-01-17 ENCOUNTER — Telehealth (HOSPITAL_COMMUNITY): Payer: Self-pay | Admitting: Cardiology

## 2020-01-17 DIAGNOSIS — R0600 Dyspnea, unspecified: Secondary | ICD-10-CM

## 2020-01-17 NOTE — Telephone Encounter (Signed)
Will work on arranging

## 2020-01-17 NOTE — Telephone Encounter (Signed)
Patient called in with increased dyspnea.    He says that this has been gradually progressive and that he has been "in denial."  He noted it as being worse in the last few weeks, was short of breath and fatigued trying to walk around [employer] last week, he is not sure he is going to be able to go shopping anymore due to his symptoms.  No definite chest pain, just worsening exertional dyspnea.    He has also been taking amiodarone long-term for history of VT.  No recent VT or atrial arrhythmias.   I had a long discussion with him on the phone today.  I think that we need a full evaluation, will set him up for RHC/LHC to assess filling pressures and cardiac output as well as coronaries.  In 2018, he presented with dyspnea as anginal equivalent prior to ramus PCI.  Additionally, concerned that he could have developed amiodarone lung toxicity given long-term amiodarone use.  Think we can decrease amiodarone to 100 mg daily. He will need PFTs.   Will try to arrange RHC/LHC and PFTs on the same day in the hospital, hopefully some time in the next week.  He will need BMET and CBC prior.  He should hold his Lasix the day of the procedure and the day before the procedure.  Hold valsartan the day of procedure.  Gentle hydration with normal saline 100 cc/hr on arrival to short stay. Will need COVID test.   Loralie Champagne 01/17/2020 12:23 PM

## 2020-01-17 NOTE — Addendum Note (Signed)
Addended by: Scarlette Calico on: 01/17/2020 01:19 PM   Modules accepted: Orders

## 2020-01-19 ENCOUNTER — Other Ambulatory Visit (HOSPITAL_COMMUNITY): Payer: Self-pay | Admitting: *Deleted

## 2020-01-19 ENCOUNTER — Telehealth (HOSPITAL_COMMUNITY): Payer: Self-pay | Admitting: *Deleted

## 2020-01-19 DIAGNOSIS — R0600 Dyspnea, unspecified: Secondary | ICD-10-CM

## 2020-01-19 NOTE — Telephone Encounter (Signed)
Spent 38 min on the phone w/pt discussing pfts, heasrt cath, and covid testing. All instructions reviewed with pt, he verbalized understanding and all questions were answered. Order for PCR/NATT AND the Rapid tests faxed to [facility] at [phone], copy emailed to pt at [email or web address]

## 2020-01-24 ENCOUNTER — Telehealth: Payer: Medicare Other | Admitting: Internal Medicine

## 2020-01-25 ENCOUNTER — Other Ambulatory Visit (HOSPITAL_COMMUNITY)
Admission: RE | Admit: 2020-01-25 | Discharge: 2020-01-25 | Disposition: A | Discharge status: Home / Self Care | Payer: Medicare Other | Source: Ambulatory Visit | Attending: Cardiology | Admitting: Cardiology

## 2020-01-25 DIAGNOSIS — Z01812 Encounter for preprocedural laboratory examination: Secondary | ICD-10-CM | POA: Insufficient documentation

## 2020-01-25 DIAGNOSIS — Z20822 Contact with and (suspected) exposure to covid-19: Secondary | ICD-10-CM | POA: Diagnosis not present

## 2020-01-25 LAB — SARS CORONAVIRUS 2 (TAT 6-24 HRS): SARS Coronavirus 2: NEGATIVE

## 2020-01-28 ENCOUNTER — Encounter (HOSPITAL_COMMUNITY)
Admission: RE | Disposition: A | Discharge status: Home / Self Care | Payer: Self-pay | Source: Home / Self Care | Attending: Cardiology

## 2020-01-28 ENCOUNTER — Other Ambulatory Visit: Payer: Self-pay

## 2020-01-28 ENCOUNTER — Ambulatory Visit (HOSPITAL_COMMUNITY)
Admission: RE | Admit: 2020-01-28 | Discharge: 2020-01-28 | Disposition: A | Discharge status: Home / Self Care | Payer: Medicare Other | Attending: Cardiology | Admitting: Cardiology

## 2020-01-28 ENCOUNTER — Ambulatory Visit (HOSPITAL_COMMUNITY)
Admission: RE | Admit: 2020-01-28 | Discharge: 2020-01-28 | Disposition: A | Discharge status: Home / Self Care | Payer: Medicare Other | Source: Ambulatory Visit | Attending: Cardiology | Admitting: Cardiology

## 2020-01-28 DIAGNOSIS — Z79899 Other long term (current) drug therapy: Secondary | ICD-10-CM | POA: Insufficient documentation

## 2020-01-28 DIAGNOSIS — Z7902 Long term (current) use of antithrombotics/antiplatelets: Secondary | ICD-10-CM | POA: Diagnosis not present

## 2020-01-28 DIAGNOSIS — R0600 Dyspnea, unspecified: Secondary | ICD-10-CM | POA: Diagnosis present

## 2020-01-28 DIAGNOSIS — Z87891 Personal history of nicotine dependence: Secondary | ICD-10-CM | POA: Insufficient documentation

## 2020-01-28 DIAGNOSIS — I255 Ischemic cardiomyopathy: Secondary | ICD-10-CM | POA: Diagnosis not present

## 2020-01-28 DIAGNOSIS — I110 Hypertensive heart disease with heart failure: Secondary | ICD-10-CM | POA: Insufficient documentation

## 2020-01-28 DIAGNOSIS — I2510 Atherosclerotic heart disease of native coronary artery without angina pectoris: Secondary | ICD-10-CM

## 2020-01-28 DIAGNOSIS — I252 Old myocardial infarction: Secondary | ICD-10-CM | POA: Insufficient documentation

## 2020-01-28 DIAGNOSIS — Z95810 Presence of automatic (implantable) cardiac defibrillator: Secondary | ICD-10-CM | POA: Insufficient documentation

## 2020-01-28 DIAGNOSIS — I472 Ventricular tachycardia, unspecified: Secondary | ICD-10-CM

## 2020-01-28 DIAGNOSIS — M069 Rheumatoid arthritis, unspecified: Secondary | ICD-10-CM | POA: Diagnosis not present

## 2020-01-28 DIAGNOSIS — I739 Peripheral vascular disease, unspecified: Secondary | ICD-10-CM | POA: Insufficient documentation

## 2020-01-28 DIAGNOSIS — Z9861 Coronary angioplasty status: Secondary | ICD-10-CM

## 2020-01-28 DIAGNOSIS — I4892 Unspecified atrial flutter: Secondary | ICD-10-CM | POA: Insufficient documentation

## 2020-01-28 DIAGNOSIS — F419 Anxiety disorder, unspecified: Secondary | ICD-10-CM | POA: Insufficient documentation

## 2020-01-28 DIAGNOSIS — I509 Heart failure, unspecified: Secondary | ICD-10-CM | POA: Diagnosis not present

## 2020-01-28 DIAGNOSIS — Z7982 Long term (current) use of aspirin: Secondary | ICD-10-CM | POA: Diagnosis not present

## 2020-01-28 DIAGNOSIS — E785 Hyperlipidemia, unspecified: Secondary | ICD-10-CM | POA: Diagnosis not present

## 2020-01-28 DIAGNOSIS — I10 Essential (primary) hypertension: Secondary | ICD-10-CM | POA: Diagnosis present

## 2020-01-28 HISTORY — PX: RIGHT/LEFT HEART CATH AND CORONARY ANGIOGRAPHY: CATH118266

## 2020-01-28 HISTORY — PX: CORONARY BALLOON ANGIOPLASTY: CATH118233

## 2020-01-28 LAB — CBC
HCT: 36.9 % — ABNORMAL LOW (ref 39.0–52.0)
Hemoglobin: 11.6 g/dL — ABNORMAL LOW (ref 13.0–17.0)
MCH: 29.7 pg (ref 26.0–34.0)
MCHC: 31.4 g/dL (ref 30.0–36.0)
MCV: 94.4 fL (ref 80.0–100.0)
Platelets: 287 10*3/uL (ref 150–400)
RBC: 3.91 MIL/uL — ABNORMAL LOW (ref 4.22–5.81)
RDW: 14.4 % (ref 11.5–15.5)
WBC: 8.7 10*3/uL (ref 4.0–10.5)
nRBC: 0 % (ref 0.0–0.2)

## 2020-01-28 LAB — POCT I-STAT EG7
Acid-base deficit: 2 mmol/L (ref 0.0–2.0)
Bicarbonate: 23.8 mmol/L (ref 20.0–28.0)
Calcium, Ion: 1.09 mmol/L — ABNORMAL LOW (ref 1.15–1.40)
HCT: 31 % — ABNORMAL LOW (ref 39.0–52.0)
Hemoglobin: 10.5 g/dL — ABNORMAL LOW (ref 13.0–17.0)
O2 Saturation: 71 %
Potassium: 4.3 mmol/L (ref 3.5–5.1)
Sodium: 140 mmol/L (ref 135–145)
TCO2: 25 mmol/L (ref 22–32)
pCO2, Ven: 44.8 mmHg (ref 44.0–60.0)
pH, Ven: 7.334 (ref 7.250–7.430)
pO2, Ven: 40 mmHg (ref 32.0–45.0)

## 2020-01-28 LAB — BASIC METABOLIC PANEL
Anion gap: 10 (ref 5–15)
BUN: 31 mg/dL — ABNORMAL HIGH (ref 8–23)
CO2: 24 mmol/L (ref 22–32)
Calcium: 8.8 mg/dL — ABNORMAL LOW (ref 8.9–10.3)
Chloride: 102 mmol/L (ref 98–111)
Creatinine, Ser: 1.8 mg/dL — ABNORMAL HIGH (ref 0.61–1.24)
GFR calc Af Amer: 42 mL/min — ABNORMAL LOW (ref >60)
GFR calc non Af Amer: 36 mL/min — ABNORMAL LOW (ref >60)
Glucose, Bld: 107 mg/dL — ABNORMAL HIGH (ref 70–99)
Potassium: 4.6 mmol/L (ref 3.5–5.1)
Sodium: 136 mmol/L (ref 135–145)

## 2020-01-28 LAB — PULMONARY FUNCTION TEST
DL/VA % pred: 71 %
DL/VA: 2.84 ml/min/mmHg/L
DLCO unc % pred: 53 %
DLCO unc: 13.54 ml/min/mmHg
FEF 25-75 Post: 2.2 L/sec
FEF 25-75 Pre: 1.99 L/sec
FEF2575-%Change-Post: 10 %
FEF2575-%Pred-Post: 98 %
FEF2575-%Pred-Pre: 89 %
FEV1-%Change-Post: 0 %
FEV1-%Pred-Post: 79 %
FEV1-%Pred-Pre: 79 %
FEV1-Post: 2.45 L
FEV1-Pre: 2.43 L
FEV1FVC-%Change-Post: -2 %
FEV1FVC-%Pred-Pre: 105 %
FEV6-%Change-Post: 2 %
FEV6-%Pred-Post: 82 %
FEV6-%Pred-Pre: 80 %
FEV6-Post: 3.26 L
FEV6-Pre: 3.18 L
FEV6FVC-%Change-Post: 0 %
FEV6FVC-%Pred-Post: 105 %
FEV6FVC-%Pred-Pre: 106 %
FVC-%Change-Post: 3 %
FVC-%Pred-Post: 77 %
FVC-%Pred-Pre: 75 %
FVC-Post: 3.28 L
FVC-Pre: 3.19 L
Post FEV1/FVC ratio: 75 %
Post FEV6/FVC ratio: 99 %
Pre FEV1/FVC ratio: 76 %
Pre FEV6/FVC Ratio: 100 %
RV % pred: 89 %
RV: 2.28 L
TLC % pred: 79 %
TLC: 5.62 L

## 2020-01-28 LAB — POCT ACTIVATED CLOTTING TIME
Activated Clotting Time: 191 seconds
Activated Clotting Time: 378 seconds

## 2020-01-28 SURGERY — RIGHT/LEFT HEART CATH AND CORONARY ANGIOGRAPHY
Anesthesia: LOCAL

## 2020-01-28 MED ORDER — LABETALOL HCL 5 MG/ML IV SOLN: 10.0000 mg | INTRAVENOUS | Status: DC | PRN

## 2020-01-28 MED ORDER — FENTANYL CITRATE (PF) 100 MCG/2ML IJ SOLN
INTRAMUSCULAR | Status: AC
  Filled 2020-01-28: qty 2

## 2020-01-28 MED ORDER — HYDRALAZINE HCL 20 MG/ML IJ SOLN: 10.0000 mg | INTRAMUSCULAR | Status: DC | PRN

## 2020-01-28 MED ORDER — SODIUM CHLORIDE 0.9% FLUSH: 3.0000 mL | INTRAVENOUS | Status: DC | PRN

## 2020-01-28 MED ORDER — ACETAMINOPHEN 325 MG PO TABS
650.0000 mg | ORAL_TABLET | ORAL | Status: DC | PRN
  Administered 2020-01-28: 650 mg via ORAL
  Filled 2020-01-28: qty 2

## 2020-01-28 MED ORDER — IOHEXOL 350 MG/ML SOLN: INTRAVENOUS | Status: DC | PRN

## 2020-01-28 MED ORDER — HEPARIN (PORCINE) IN NACL 1000-0.9 UT/500ML-% IV SOLN
INTRAVENOUS | Status: AC
  Filled 2020-01-28: qty 1000

## 2020-01-28 MED ORDER — SODIUM CHLORIDE 0.9 % IV SOLN: INTRAVENOUS | Status: DC

## 2020-01-28 MED ORDER — SODIUM CHLORIDE 0.9 % IV SOLN: 250.0000 mL | INTRAVENOUS | Status: DC | PRN

## 2020-01-28 MED ORDER — SODIUM CHLORIDE 0.9% FLUSH: 3.0000 mL | Freq: Two times a day (BID) | INTRAVENOUS | Status: DC

## 2020-01-28 MED ORDER — CLOPIDOGREL BISULFATE 75 MG PO TABS: 75.0000 mg | ORAL_TABLET | Freq: Every day | ORAL | Status: DC

## 2020-01-28 MED ORDER — ONDANSETRON HCL 4 MG/2ML IJ SOLN: 4.0000 mg | Freq: Four times a day (QID) | INTRAMUSCULAR | Status: DC | PRN

## 2020-01-28 MED ORDER — VERAPAMIL HCL 2.5 MG/ML IV SOLN
INTRAVENOUS | Status: AC
  Filled 2020-01-28: qty 2

## 2020-01-28 MED ORDER — SODIUM CHLORIDE 0.9 % IV SOLN: INTRAVENOUS | Status: AC

## 2020-01-28 MED ORDER — FENTANYL CITRATE (PF) 100 MCG/2ML IJ SOLN
INTRAMUSCULAR | Status: DC | PRN
Start: 2020-01-28 — End: 2020-01-28
  Administered 2020-01-28 (×3): 25 ug via INTRAVENOUS

## 2020-01-28 MED ORDER — HEPARIN SODIUM (PORCINE) 1000 UNIT/ML IJ SOLN
INTRAMUSCULAR | Status: DC | PRN
  Administered 2020-01-28: 4000 [IU] via INTRAVENOUS

## 2020-01-28 MED ORDER — NITROGLYCERIN 1 MG/10 ML FOR IR/CATH LAB
INTRA_ARTERIAL | Status: DC | PRN
  Administered 2020-01-28: 200 ug via INTRACORONARY

## 2020-01-28 MED ORDER — FENTANYL CITRATE (PF) 100 MCG/2ML IJ SOLN
INTRAMUSCULAR | Status: DC | PRN
  Administered 2020-01-28: 25 ug via INTRAVENOUS

## 2020-01-28 MED ORDER — MIDAZOLAM HCL 2 MG/2ML IJ SOLN
INTRAMUSCULAR | Status: DC | PRN
  Administered 2020-01-28 (×3): 1 mg via INTRAVENOUS

## 2020-01-28 MED ORDER — HEPARIN SODIUM (PORCINE) 1000 UNIT/ML IJ SOLN
INTRAMUSCULAR | Status: DC | PRN
  Administered 2020-01-28: 10000 [IU] via INTRAVENOUS

## 2020-01-28 MED ORDER — ALBUTEROL SULFATE (2.5 MG/3ML) 0.083% IN NEBU
2.5000 mg | INHALATION_SOLUTION | Freq: Once | RESPIRATORY_TRACT | Status: AC
  Administered 2020-01-28: 2.5 mg via RESPIRATORY_TRACT

## 2020-01-28 MED ORDER — HEPARIN SODIUM (PORCINE) 1000 UNIT/ML IJ SOLN
INTRAMUSCULAR | Status: AC
  Filled 2020-01-28: qty 1

## 2020-01-28 MED ORDER — VERAPAMIL HCL 2.5 MG/ML IV SOLN
INTRAVENOUS | Status: DC | PRN
  Administered 2020-01-28: 10 mL via INTRA_ARTERIAL

## 2020-01-28 MED ORDER — ASPIRIN 81 MG PO CHEW: 81.0000 mg | CHEWABLE_TABLET | Freq: Every day | ORAL | Status: DC

## 2020-01-28 MED ORDER — MIDAZOLAM HCL 2 MG/2ML IJ SOLN
INTRAMUSCULAR | Status: DC | PRN
  Administered 2020-01-28: 1 mg via INTRAVENOUS

## 2020-01-28 MED ORDER — LIDOCAINE HCL (PF) 1 % IJ SOLN
INTRAMUSCULAR | Status: AC
  Filled 2020-01-28: qty 30

## 2020-01-28 MED ORDER — MIDAZOLAM HCL 2 MG/2ML IJ SOLN
INTRAMUSCULAR | Status: AC
  Filled 2020-01-28: qty 2

## 2020-01-28 MED ORDER — LIDOCAINE HCL (PF) 1 % IJ SOLN
INTRAMUSCULAR | Status: DC | PRN
  Administered 2020-01-28 (×2): 2 mL

## 2020-01-28 MED ORDER — HEPARIN (PORCINE) IN NACL 1000-0.9 UT/500ML-% IV SOLN
INTRAVENOUS | Status: DC | PRN
  Administered 2020-01-28 (×2): 500 mL

## 2020-01-28 MED ORDER — IOHEXOL 350 MG/ML SOLN
INTRAVENOUS | Status: DC | PRN
  Administered 2020-01-28: 115 mL via INTRA_ARTERIAL

## 2020-01-28 SURGICAL SUPPLY — 18 items
BALLN SAPPHIRE ~~LOC~~ 3.0X15 (BALLOONS) ×2 IMPLANT
BALLN WOLVERINE 2.50X10 (BALLOONS) ×2
BALLOON WOLVERINE 2.50X10 (BALLOONS) ×1 IMPLANT
CATH 5FR JL3.5 JR4 ANG PIG MP (CATHETERS) ×2 IMPLANT
CATH BALLN WEDGE 5F 110CM (CATHETERS) ×2 IMPLANT
CATH LAUNCHER 6FR EBU3.5 (CATHETERS) ×2 IMPLANT
DEVICE RAD COMP TR BAND LRG (VASCULAR PRODUCTS) ×2 IMPLANT
GLIDESHEATH SLEND SS 6F .021 (SHEATH) ×2 IMPLANT
GUIDEWIRE INQWIRE 1.5J.035X260 (WIRE) ×1 IMPLANT
INQWIRE 1.5J .035X260CM (WIRE) ×2
KIT ENCORE 26 ADVANTAGE (KITS) ×2 IMPLANT
KIT HEART LEFT (KITS) ×2 IMPLANT
KIT HEMO VALVE WATCHDOG (MISCELLANEOUS) ×2 IMPLANT
PACK CARDIAC CATHETERIZATION (CUSTOM PROCEDURE TRAY) ×2 IMPLANT
SHEATH GLIDE SLENDER 4/5FR (SHEATH) ×2 IMPLANT
TRANSDUCER W/STOPCOCK (MISCELLANEOUS) ×2 IMPLANT
WIRE ASAHI PROWATER 180CM (WIRE) ×2 IMPLANT
WIRE MICROINTRODUCER 60CM (WIRE) ×2 IMPLANT

## 2020-01-28 NOTE — H&P (Signed)
Advanced Heart Failure Team History and Physical Note   PCP:  Ronita Hipps, MD  PCP-Cardiology: No primary care provider on file.     HPI:  74 yo with history of CAD, CHF, and VT presents for RHC/LHC due to progressive dyspnea over the last couple of months.  We discussed this over the phone and decided to proceed with RHC/LHC due to worsening of symptoms.    Review of Systems: [y] = yes, [ ]  = no   General: Weight gain [ ] ; Weight loss [ ] ; Anorexia [ ] ; Fatigue [ ] ; Fever [ ] ; Chills [ ] ; Weakness [ ]   Cardiac: Chest pain/pressure [ ] ; Resting SOB [ ] ; Exertional SOB [ ] ; Orthopnea [ ] ; Pedal Edema [ ] ; Palpitations [ ] ; Syncope [ ] ; Presyncope [ ] ; Paroxysmal nocturnal dyspnea[ ]   Pulmonary: Cough [ ] ; Wheezing[ ] ; Hemoptysis[ ] ; Sputum [ ] ; Snoring [ ]   GI: Vomiting[ ] ; Dysphagia[ ] ; Melena[ ] ; Hematochezia [ ] ; Heartburn[ ] ; Abdominal pain [ ] ; Constipation [ ] ; Diarrhea [ ] ; BRBPR [ ]   GU: Hematuria[ ] ; Dysuria [ ] ; Nocturia[ ]   Vascular: Pain in legs with walking [ ] ; Pain in feet with lying flat [ ] ; Non-healing sores [ ] ; Stroke [ ] ; TIA [ ] ; Slurred speech [ ] ;  Neuro: Headaches[ ] ; Vertigo[ ] ; Seizures[ ] ; Paresthesias[ ] ;Blurred vision [ ] ; Diplopia [ ] ; Vision changes [ ]   Ortho/Skin: Arthritis [ ] ; Joint pain [ ] ; Muscle pain [ ] ; Joint swelling [ ] ; Back Pain [ ] ; Rash [ ]   Psych: Depression[ ] ; Anxiety[ ]   Heme: Bleeding problems [ ] ; Clotting disorders [ ] ; Anemia [ ]   Endocrine: Diabetes [ ] ; Thyroid dysfunction[ ]    Home Medications Prior to Admission medications   Medication Sig Start Date End Date Taking? Authorizing Provider  amiodarone (PACERONE) 200 MG tablet TAKE 1 TABLET BY MOUTH EVERY DAY Patient taking differently: Take 200 mg by mouth daily.  11/09/19  Yes Larey Dresser, MD  aspirin EC 81 MG tablet Take 1 tablet (81 mg total) by mouth daily. 11/27/10  Yes Larey Dresser, MD  carvedilol (COREG) 25 MG tablet Take 1 tablet (25 mg total) by mouth 2  (two) times daily with a meal. 04/06/19  Yes Allred, Jeneen Rinks, MD  chlorhexidine (PERIDEX) 0.12 % solution Use as directed 15 mLs in the mouth or throat daily as needed (irritation).  02/02/15  Yes [provider]  clopidogrel (PLAVIX) 75 MG tablet TAKE 1 TABLET (75 MG TOTAL) BY MOUTH DAILY WITH BREAKFAST. Patient taking differently: Take 75 mg by mouth daily.  09/07/19  Yes Larey Dresser, MD  eplerenone (INSPRA) 25 MG tablet TAKE 1 TABLET BY MOUTH EVERY DAY Patient taking differently: Take 25 mg by mouth daily.  11/09/19  Yes Larey Dresser, MD  fluticasone [facility]) 50 MCG/ACT nasal spray Place 1-2 sprays into both nostrils daily as needed for allergies or rhinitis.   Yes [provider]  furosemide (LASIX) 40 MG tablet TAKE 1 TABLET BY MOUTH EVERY DAY Patient taking differently: Take 40 mg by mouth daily.  09/07/19  Yes Larey Dresser, MD  leflunomide (ARAVA) 20 MG tablet Take 20 mg by mouth daily.     Yes [provider]  Melatonin 10 MG TABS Take 10 mg by mouth at bedtime as needed (sleep).    Yes [provider]  simvastatin (ZOCOR) 20 MG tablet Take 1 tablet (20 mg total) by mouth at bedtime.  04/06/19  Yes Larey Dresser, MD  valsartan (DIOVAN) 80 MG tablet TAKE 1 TABLET (80 MG TOTAL) BY MOUTH 2 (TWO) TIMES DAILY. 06/14/19  Yes Bensimhon, Shaune Pascal, MD  amoxicillin (AMOXIL) 500 MG capsule Take 500 mg by mouth 2 (two) times daily. Patient not taking: Reported on 01/20/2020 04/05/19   [provider]    Past Medical History: Past Medical History:  Diagnosis Date  . Abnormal PFTs 7/11   FVC 74%, FEV1 80%, ratio 75%, TLC 78%, DLCO 68%. this suggests a mild restrictive and obstructive defect. pt did have a response to bronchodilator. these PFTs were significantly better than the report from Dr. Alcide Clever in [place] done prior.   [name] AICD (automatic cardioverter/defibrillator) present   . Anxiety   . Atrial flutter ([place])    s/p isthmus ablation 5/10  . CAD  (coronary artery disease)    hx of silent MI in 1993. likely an inferior MI. hx of 2D cardiogram in 3/09 showing EF of 40%. hx of myoview in [place] 3/09-nml. presented to [facility] 5/10 with VT and mildly elevated cardiac enzymes LHC (5/10): inferobasal dyskinesis with EF 35-40%. was chronic total occlusion of mid RCA with good collaterals. luminals LCA. this does not appear to be an acute cause of the 5/10 event  . Cancer ([place])    skin  . CHF (congestive heart failure) ([place])   . Chronic lower back pain   . HLD (hyperlipidemia)   . HTN (hypertension)   . Ischemic cardiomyopathy    EF 35-40% by LV-gram 5/10 with inferobasal dyskinesis. echo 5/10 showed EF 40% w/mild LVH, no sig. MR, inferobasal and posterobasal akinesis. echo (7/11): EF 50%, mild LVH, basal-mid inferoposterior akenesis  . Migraine    "visual migraine; maybe 12/year" (07/16/2016)  . PAD (peripheral artery disease) (HCC)    s/p L-to-R fem-fem bypass performed by Dr. Scot Dock at [facility] in 2009   . Rheumatoid arthritis (HCC)    on leflunomide  . Silent myocardial infarction [facility]) 1993   "silent"  . Tobacco abuse    47 pack year hx; quit 8/09  . Ventricular tachycardia ([place])    likely scar-mediated. 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 He has duel chamber Medtronic ICD    Past Surgical History: Past Surgical History:  Procedure Laterality Date  . CARDIAC DEFIBRILLATOR PLACEMENT  09/22/2008  . CATARACT EXTRACTION W/ INTRAOCULAR LENS  IMPLANT, BILATERAL Bilateral 05/2016 - 06/2016  . CORONARY CTO INTERVENTION  07/16/2016  . CORONARY CTO INTERVENTION N/A 07/16/2016   Procedure: Coronary CTO Intervention;  Surgeon: Belva Crome, MD;  Location: [place] [facility];  Service: Cardiovascular;  Laterality: N/A;  . EP IMPLANTABLE DEVICE  09/22/08   Medtronic, ICD Model Number:  D274DRG, ICD Serial Number: [id number]  . FEMORAL ARTERY - FEMORAL ARTERY BYPASS GRAFT  march 2009   left to right bypass, first @ [facility], second at [facility] by Dr Scot Dock  . ICD GENERATOR CHANGEOUT N/A 11/12/2016   Procedure: ICD Generator Changeout;  Surgeon: Thompson Grayer, MD;  Location: [place] [facility];  Service: Cardiovascular;  Laterality: N/A;  . RIGHT/LEFT HEART CATH AND CORONARY ANGIOGRAPHY N/A 07/12/2016   Procedure: Right/Left Heart Cath and Coronary Angiography;  Surgeon: Larey Dresser, MD;  Location: [place] [facility];  Service: Cardiovascular;  Laterality: N/A;  . TONSILLECTOMY    . VASECTOMY    . VENTRICULAR ABLATION SURGERY  09/2008    Family History:  Family History  Problem  Relation Age of Onset  . Gastric cancer Mother   . Throat cancer Father   . Peripheral vascular disease Father   . Heart attack Brother 58    Social History: Social History   Socioeconomic History  . Marital status: Single    Spouse name: Not on file  . Number of children: Not on file  . Years of education: Not on file  . Highest education level: Not on file  Occupational History  . Not on file  Tobacco Use  . Smoking status: Former Smoker    Packs/day: 1.00    Years: 47.00    Pack years: 47.00    Quit date: 2009    Years since quitting: 12.7  . Smokeless tobacco: Never Used  Vaping Use  . Vaping Use: Never used  Substance and Sexual Activity  . Alcohol use: Yes    Alcohol/week: 0.0 standard drinks    Comment: 07/15/2016 "nothing for the last couple years"  . Drug use: No    Comment: prior   . Sexual activity: Not on file  Other Topics Concern  . Not on file  Social History Narrative   Single; gets minimal exercise; 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 originally from [place] Strain:   . Difficulty of Paying Living Expenses: Not on file  Food Insecurity:   . Worried About Charity fundraiser in the Last Year: Not on file  . Ran Out of Food in the Last Year: Not on file  Transportation Needs:   . Lack of Transportation (Medical): Not on file  . Lack of  Transportation (Non-Medical): Not on file  Physical Activity:   . Days of Exercise per Week: Not on file  . Minutes of Exercise per Session: Not on file  Stress:   . Feeling of Stress : Not on file  Social Connections:   . Frequency of Communication with Friends and Family: Not on file  . Frequency of Social Gatherings with Friends and Family: Not on file  . Attends Religious Services: Not on file  . Active Member of Clubs or Organizations: Not on file  . Attends Archivist Meetings: Not on file  . Marital Status: Not on file    Allergies:  Allergies  Allergen Reactions  . Atorvastatin Other (See Comments)    Muscle pain  . Crestor [Rosuvastatin Calcium] Other (See Comments)    Muscle pain  . Losartan Other (See Comments)    Muscle pain   . Telmisartan Other (See Comments)    Stomach ache  . Enalapril Other (See Comments)    Upset stomach  . Lisinopril Cough    Objective:    Vital Signs:   Temp:  [97.5 F (36.4 C)] 97.5 F (36.4 C) (09/24 0954) Pulse Rate:  [52] 52 (09/24 0954) Resp:  [20] 20 (09/24 0954) BP: (130)/(64) 130/64 (09/24 0954) SpO2:  [98 %] 98 % (09/24 0954) Weight:  [83.9 kg] 83.9 kg (09/24 0954)   Filed Weights   01/28/20 0954  Weight: 83.9 kg     Physical Exam     General:  Well appearing. No respiratory difficulty HEENT: Normal Neck: Supple. no JVD. Carotids 2+ bilat; no bruits. No lymphadenopathy or thyromegaly appreciated. Cor: PMI nondisplaced. Regular rate & rhythm. No rubs, gallops or murmurs. Lungs: Clear Abdomen: Soft, nontender, nondistended. No hepatosplenomegaly. No bruits or masses. Good bowel sounds. Extremities: No cyanosis, clubbing, rash, edema Neuro: Alert & oriented x 3,  cranial nerves grossly intact. moves all 4 extremities w/o difficulty. Affect pleasant.    Labs     Basic Metabolic Panel: Recent Labs  Lab 01/28/20 0955  NA 136  K 4.6  CL 102  CO2 24  GLUCOSE 107*  BUN 31*  CREATININE 1.80*   CALCIUM 8.8*    Liver Function Tests: No results for input(s): AST, ALT, ALKPHOS, BILITOT, PROT, ALBUMIN in the last 168 hours. No results for input(s): LIPASE, AMYLASE in the last 168 hours. No results for input(s): AMMONIA in the last 168 hours.  CBC: Recent Labs  Lab 01/28/20 0955  WBC 8.7  HGB 11.6*  HCT 36.9*  MCV 94.4  PLT 287    Cardiac Enzymes: No results for input(s): CKTOTAL, CKMB, CKMBINDEX, TROPONINI in the last 168 hours.  BNP: BNP (last 3 results) No results for input(s): BNP in the last 8760 hours.  ProBNP (last 3 results) No results for input(s): PROBNP in the last 8760 hours.   CBG: No results for input(s): GLUCAP in the last 168 hours.  Coagulation Studies: No results for input(s): LABPROT, INR in the last 72 hours.  Imaging:  No results found.   Assessment/Plan   Patient presents for RHC/LHC today due to worsening dyspnea.  Will proceed with cath.    Loralie Champagne, MD 01/28/2020, 11:57 AM  Advanced Heart Failure Team Pager [phone] (M-F; 7a - 4p)  Please contact [facility] for night-coverage after hours (4p -7a ) and weekends on [email or web address]

## 2020-01-28 NOTE — Discharge Instructions (Signed)
Drink plenty of fluid for 48 hours and keep wrist elevated at heart level for 24 hours  Radial Site Care   This sheet gives you information about how to care for yourself after your procedure. Your health care provider may also give you more specific instructions. If you have problems or questions, contact your health care provider. What can I expect after the procedure? After the procedure, it is common to have:  Bruising and tenderness at the catheter insertion area. Follow these instructions at home: Medicines  Take over-the-counter and prescription medicines only as told by your health care provider. Insertion site care 1. Follow instructions from your health care provider about how to take care of your insertion site. Make sure you: ? Wash your hands with soap and water before you change your bandage (dressing). If soap and water are not available, use hand sanitizer. ? remove your dressing as told by your health care provider. In 24 hours 2. Check your insertion site every day for signs of infection. Check for: ? Redness, swelling, or pain. ? Fluid or blood. ? Pus or a bad smell. ? Warmth. 3. Do not take baths, swim, or use a hot tub until your health care provider approves. 4. You may shower 24-48 hours after the procedure, or as directed by your health care provider. ? Remove the dressing and gently wash the site with plain soap and water. ? Pat the area dry with a clean towel. ? Do not rub the site. That could cause bleeding. 5. Do not apply powder or lotion to the site. Activity   1. For 24 hours after the procedure, or as directed by your health care provider: ? Do not flex or bend the affected arm. ? Do not push or pull heavy objects with the affected arm. ? Do not drive yourself home from the hospital or clinic. You may drive 24 hours after the procedure unless your health care provider tells you not to. ? Do not operate machinery or power tools. 2. Do not lift  anything that is heavier than 10 lb (4.5 kg), or the limit that you are told, until your health care provider says that it is safe. For 4 days 3. Ask your health care provider when it is okay to: ? Return to work or school. ? Resume usual physical activities or sports. ? Resume sexual activity. General instructions  If the catheter site starts to bleed, raise your arm and put firm pressure on the site. If the bleeding does not stop, get help right away. This is a medical emergency.  If you went home on the same day as your procedure, a responsible adult should be with you for the first 24 hours after you arrive home.  Keep all follow-up visits as told by your health care provider. This is important. Contact a health care provider if:  You have a fever.  You have redness, swelling, or yellow drainage around your insertion site. Get help right away if:  You have unusual pain at the radial site.  The catheter insertion area swells very fast.  The insertion area is bleeding, and the bleeding does not stop when you hold steady pressure on the area.  Your arm or hand becomes pale, cool, tingly, or numb. These symptoms may represent a serious problem that is an emergency. Do not wait to see if the symptoms will go away. Get medical help right away. Call your local emergency services (911 in the [place] Do not   drive yourself to the hospital. Summary  After the procedure, it is common to have bruising and tenderness at the site.  Follow instructions from your health care provider about how to take care of your radial site wound. Check the wound every day for signs of infection.  Do not lift anything that is heavier than 10 lb (4.5 kg), or the limit that you are told, until your health care provider says that it is safe. This information is not intended to replace advice given to you by your health care provider. Make sure you discuss any questions you have with your health care  provider. Document Revised: 05/28/2017 Document Reviewed: 05/28/2017 Elsevier Patient Education  2020 Elsevier Inc.  

## 2020-01-28 NOTE — Discharge Summary (Signed)
Discharge Summary    Patient ID: Nathan Pruitt MRN: [id number]; DOB: Sep 22, 1945  Admit date: 01/28/2020 Discharge date: 01/28/2020  Primary Care Provider: Ronita Hipps, MD  Primary Cardiologist: Dr. Aundra Dubin, MD   Discharge Diagnoses    Principal Problem:   CAD S/P percutaneous coronary angioplasty Active Problems:   HYPERTENSION, BENIGN ESSENTIAL   Ventricular tachycardia [facility])  Diagnostic Studies/Procedures    [facility] 01/28/20:   Normal filling pressures.  2. Preserved cardiac output.  3. Focal 95% in-stent restenosis in mid large OM1.    Discussed with Dr. Irish Lack, will plan PCI.  Patient is on Plavix chronically.   Coronary Stent Intervention 01/28/20:   1st Mrg-2 lesion is 90% restenosis in the mid portion of the previously stented segment.  Balloon angioplasty was performed using a BALLOON WOLVERINE 2.50X10, followed by a 3.0 [place] balloon.  Post intervention, there is a 0% residual stenosis.  The other two stents appear patent.  LVEDP 12 mm Hg.   Continue DAPT with aspirin and clopidogrel.   Gentle hydration post procedure given his normal LVEDP.    History of Present Illness     Nathan Pruitt is a 74 y.o. male with a history of CAD, CHF, and VT who presented  for RHC/LHC due to progressive dyspnea over the last couple of months.   Nathan Pruitt reported that he had been gradually experiencing progressive dsypnea and that he has been "in denial."  He noted it worsening several weeks prior to him calling the office. He also reported exertional fatigue while walking at [employer] last. There was no definite chest pain.   He has also been taking amiodarone long-term for history of VT.  No recent VT or atrial arrhythmias.   Dr. Aundra Dubin had a long discussion with him on the phone with recommendations for [facility] for full evaluation therefore he was set up for RHC/LHC to assess filling pressures and cardiac output as well as coronaries.  In 2018, he presented with  dyspnea as anginal equivalent prior to ramus PCI.  Additionally, given concern that he could have developed amiodarone lung toxicity given long-term amiodarone use amiodarone was decreased to 100 mg daily.   Hospital Course   He presented to [facility] on 01/[facility]/20 for [facility] which showed a focal 95% in-stent restenosis in mid large OM1. Therefore balloon angioplasty was performed using a BALLOON WOLVERINE 2.50X10, followed by a 3.0 [place] balloon. Post intervention, there is a 0% residual stenosis. The other two stents appear patent. LVEDP 12 mm Hg. Recommendations are for DAPT with aspirin and clopidogrel.   Per Dr. Irish Lack, patient is eligible for same day PCI discharge. Cath site stable without s/s/ of hematoma or bleeding. There are no new medications. Will send message to obtain follow up with Dr. Aundra Dubin or APP in the next 2 weeks.   Consultants: None   The patient was seen and examined by Dr. Aundra Dubin who feels that he is stable and ready for discharge today.   Did the patient have an acute coronary syndrome (MI, NSTEMI, STEMI, etc) this admission?:  Yes                               AHA/ACC Clinical Performance & Quality Measures: 1. Aspirin prescribed? - Yes 2. ADP Receptor Inhibitor (Plavix/Clopidogrel, Brilinta/Ticagrelor or Effient/Prasugrel) prescribed (includes medically managed patients)? - Yes 3. Beta Blocker prescribed? - Yes 4. High Intensity Statin (Lipitor 40-80mg  or Crestor 20-40mg ) prescribed? - Yes 5.  EF assessed during THIS hospitalization? - Yes 6. For EF <40%, was ACEI/ARB prescribed? - Not Applicable (EF >/= 21%) 7. For EF <40%, Aldosterone Antagonist (Spironolactone or Eplerenone) prescribed? - Not Applicable (EF >/= 30%) 8. Cardiac Rehab Phase II ordered (including medically managed patients)? - Yes   Discharge Vitals Blood pressure (!) 156/76, pulse 62, temperature (!) 97.5 F (36.4 C), temperature source Oral, resp. rate 12, height 5\' 10"  (1.778 m), weight 83.9 kg, SpO2 98  %.  Filed Weights   01/28/20 0954  Weight: 83.9 kg   Labs & Radiologic Studies    CBC Recent Labs    01/28/20 0955  WBC 8.7  HGB 11.6*  HCT 36.9*  MCV 94.4  PLT 865   Basic Metabolic Panel Recent Labs    01/28/20 0955  NA 136  K 4.6  CL 102  CO2 24  GLUCOSE 107*  BUN 31*  CREATININE 1.80*  CALCIUM 8.8*   _____________  CARDIAC CATHETERIZATION  Result Date: 01/28/2020 1. Normal filling pressures. 2. Preserved cardiac output. 3. Focal 95% in-stent restenosis in mid large OM1.  Discussed with Dr. Irish Lack, will plan PCI.  Patient is on Plavix chronically.   CARDIAC CATHETERIZATION  Result Date: 01/28/2020  1st Mrg-2 lesion is 90% restenosis in the mid portion of the previously stented segment.  Balloon angioplasty was performed using a BALLOON WOLVERINE 2.50X10, followed by a 3.0 [place] balloon.  Post intervention, there is a 0% residual stenosis.  The other two stents appear patent.  LVEDP 12 mm Hg.  Continue DAPT with aspirin and clopidogrel.  Gentle hydration post procedure given his normal LVEDP.    Disposition   Pt is being discharged home today in good condition.  Follow-up Plans & Appointments     Discharge Instructions    AMB Referral to Cardiac Rehabilitation - Phase II   Complete by: As directed    Diagnosis: PTCA   After initial evaluation and assessments completed: Virtual Based Care may be provided alone or in conjunction with Phase 2 Cardiac Rehab based on patient barriers.: Yes   Call MD for:  difficulty breathing, headache or visual disturbances   Complete by: As directed    Call MD for:  extreme fatigue   Complete by: As directed    Call MD for:  hives   Complete by: As directed    Call MD for:  persistant dizziness or light-headedness   Complete by: As directed    Call MD for:  persistant nausea and vomiting   Complete by: As directed    Call MD for:  redness, tenderness, or signs of infection (pain, swelling, redness, odor or green/yellow  discharge around incision site)   Complete by: As directed    Call MD for:  severe uncontrolled pain   Complete by: As directed    Call MD for:  temperature >100.4   Complete by: As directed    Diet - low sodium heart healthy   Complete by: As directed    Discharge instructions   Complete by: As directed    No driving for 3 days. No lifting over 5 lbs for 1 week. No sexual activity for 1 week. Keep procedure site clean & dry. If you notice increased pain, swelling, bleeding or pus, call/return!  You may shower, but no soaking baths/hot tubs/pools for 1 week.   PLEASE DO NOT MISS ANY DOSES OF YOUR PLAVIX!!!! Also keep a log of you blood pressures and bring back to your follow up appt.  Please call the office with any questions.   Patients taking blood thinners should generally stay away from medicines like ibuprofen, Advil, Motrin, naproxen, and Aleve due to risk of stomach bleeding. You may take Tylenol as directed or talk to your primary doctor about alternatives.   Increase activity slowly   Complete by: As directed    PHASE II - CARDIAC REHAB   Complete by: As directed       Discharge Medications   Allergies as of 01/28/2020      Reactions   Atorvastatin Other (See Comments)   Muscle pain   Crestor [rosuvastatin Calcium] Other (See Comments)   Muscle pain   Enalapril Other (See Comments)   Upset stomach   Lisinopril Cough   Losartan Other (See Comments)   Muscle pain    Telmisartan Other (See Comments)   Stomach ache      Medication List    STOP taking these medications   amoxicillin 500 MG capsule Commonly known as: AMOXIL     TAKE these medications   amiodarone 200 MG tablet Commonly known as: PACERONE TAKE 1 TABLET BY MOUTH EVERY DAY   aspirin EC 81 MG tablet Take 1 tablet (81 mg total) by mouth daily.   carvedilol 25 MG tablet Commonly known as: COREG Take 1 tablet (25 mg total) by mouth 2 (two) times daily with a meal.   chlorhexidine 0.12 %  solution Commonly known as: PERIDEX Use as directed 15 mLs in the mouth or throat daily as needed (irritation).   clopidogrel 75 MG tablet Commonly known as: PLAVIX TAKE 1 TABLET (75 MG TOTAL) BY MOUTH DAILY WITH BREAKFAST. What changed: See the new instructions.   eplerenone 25 MG tablet Commonly known as: INSPRA TAKE 1 TABLET BY MOUTH EVERY DAY   fluticasone 50 MCG/ACT nasal spray Commonly known as: FLONASE Place 1-2 sprays into both nostrils daily as needed for allergies or rhinitis.   furosemide 40 MG tablet Commonly known as: LASIX TAKE 1 TABLET BY MOUTH EVERY DAY   leflunomide 20 MG tablet Commonly known as: ARAVA Take 20 mg by mouth daily.   Melatonin 10 MG Tabs Take 10 mg by mouth at bedtime as needed (sleep).   simvastatin 20 MG tablet Commonly known as: ZOCOR Take 1 tablet (20 mg total) by mouth at bedtime.   valsartan 80 MG tablet Commonly known as: DIOVAN TAKE 1 TABLET (80 MG TOTAL) BY MOUTH 2 (TWO) TIMES DAILY.       Outstanding Labs/Studies   BMET at follow up   Duration of Discharge Encounter   Greater than 30 minutes including physician time.  Signed, Nathan Drown, NP 01/28/2020, 4:47 PM

## 2020-01-31 ENCOUNTER — Encounter (HOSPITAL_COMMUNITY): Payer: Self-pay | Admitting: Cardiology

## 2020-01-31 ENCOUNTER — Telehealth (HOSPITAL_COMMUNITY): Payer: Self-pay | Admitting: *Deleted

## 2020-01-31 LAB — POCT I-STAT EG7
Acid-base deficit: 3 mmol/L — ABNORMAL HIGH (ref 0.0–2.0)
Bicarbonate: 23.1 mmol/L (ref 20.0–28.0)
Calcium, Ion: 1.05 mmol/L — ABNORMAL LOW (ref 1.15–1.40)
HCT: 30 % — ABNORMAL LOW (ref 39.0–52.0)
Hemoglobin: 10.2 g/dL — ABNORMAL LOW (ref 13.0–17.0)
O2 Saturation: 72 %
Potassium: 4.2 mmol/L (ref 3.5–5.1)
Sodium: 141 mmol/L (ref 135–145)
TCO2: 24 mmol/L (ref 22–32)
pCO2, Ven: 44.6 mmHg (ref 44.0–60.0)
pH, Ven: 7.323 (ref 7.250–7.430)
pO2, Ven: 41 mmHg (ref 32.0–45.0)

## 2020-02-02 ENCOUNTER — Encounter (HOSPITAL_COMMUNITY): Payer: Self-pay | Admitting: *Deleted

## 2020-02-02 ENCOUNTER — Telehealth (HOSPITAL_COMMUNITY): Payer: Self-pay | Admitting: *Deleted

## 2020-02-02 ENCOUNTER — Other Ambulatory Visit: Payer: Self-pay | Admitting: Internal Medicine

## 2020-02-02 ENCOUNTER — Other Ambulatory Visit (HOSPITAL_COMMUNITY): Payer: Self-pay | Admitting: Cardiology

## 2020-02-02 DIAGNOSIS — R942 Abnormal results of pulmonary function studies: Secondary | ICD-10-CM

## 2020-02-02 DIAGNOSIS — J849 Interstitial pulmonary disease, unspecified: Secondary | ICD-10-CM

## 2020-02-02 NOTE — Telephone Encounter (Signed)
-----  Message from Larey Dresser, MD sent at 01/28/2020  4:16 PM EDT ----- This is suggestive of interstitial fibrosis or interstitial inflammation.  He needs to get a high resolution CT chest done to assess for interstitial lung disease.  He also needs to have ESR drawn.  He needs an appointment with pulmonology to assess for amiodarone-induced lung disease.

## 2020-02-02 NOTE — Telephone Encounter (Signed)
MyChart message with information sent to pt, orders and referral placed, will sch

## 2020-02-15 ENCOUNTER — Ambulatory Visit (INDEPENDENT_AMBULATORY_CARE_PROVIDER_SITE_OTHER): Payer: Medicare Other

## 2020-02-15 ENCOUNTER — Encounter (HOSPITAL_COMMUNITY): Payer: Self-pay | Admitting: *Deleted

## 2020-02-15 ENCOUNTER — Telehealth: Payer: Self-pay

## 2020-02-15 DIAGNOSIS — I472 Ventricular tachycardia, unspecified: Secondary | ICD-10-CM

## 2020-02-15 LAB — CUP PACEART REMOTE DEVICE CHECK
Battery Remaining Longevity: 89 mo
Battery Voltage: 2.99 V
Brady Statistic AP VP Percent: 2.1 %
Brady Statistic AP VS Percent: 12.17 %
Brady Statistic AS VP Percent: 16.33 %
Brady Statistic AS VS Percent: 69.39 %
Brady Statistic RA Percent Paced: 14.27 %
Brady Statistic RV Percent Paced: 18.57 %
Date Time Interrogation Session: 20211012012504
HighPow Impedance: 38 Ohm
HighPow Impedance: 49 Ohm
Implantable Lead Implant Date: 20100520
Implantable Lead Implant Date: 20100520
Implantable Lead Location: 753859
Implantable Lead Location: 753860
Implantable Lead Model: 5076
Implantable Lead Model: 6947
Implantable Pulse Generator Implant Date: 20180710
Lead Channel Impedance Value: 266 Ohm
Lead Channel Impedance Value: 323 Ohm
Lead Channel Impedance Value: 380 Ohm
Lead Channel Pacing Threshold Amplitude: 0.5 V
Lead Channel Pacing Threshold Amplitude: 1.625 V
Lead Channel Pacing Threshold Pulse Width: 0.4 ms
Lead Channel Pacing Threshold Pulse Width: 0.4 ms
Lead Channel Sensing Intrinsic Amplitude: 2.125 mV
Lead Channel Sensing Intrinsic Amplitude: 2.125 mV
Lead Channel Sensing Intrinsic Amplitude: 7.5 mV
Lead Channel Sensing Intrinsic Amplitude: 7.5 mV
Lead Channel Setting Pacing Amplitude: 1.5 V
Lead Channel Setting Pacing Amplitude: 3.25 V
Lead Channel Setting Pacing Pulse Width: 0.4 ms
Lead Channel Setting Sensing Sensitivity: 0.3 mV

## 2020-02-15 NOTE — Telephone Encounter (Signed)
Pt concerned about optivol results.  Pt denies any increased SOB or new onset cough.  Pt does not feel he is retaining fluid.  Pt confirms compliance with Lasix 40mg  daily.  Pt has f/u visit in CHF clinic on 11/2.  Advised pt I would forward info to Dr. Aundra Dubin in case there are any changes he recommends before that visit.  Pt declined enrollemnt in ICM clinic, stated he would like to first talk to Dr. Aundra Dubin about it.

## 2020-02-15 NOTE — Telephone Encounter (Signed)
The pt called because he want his transmission received.

## 2020-02-15 NOTE — Telephone Encounter (Signed)
Sent to pt via mychart

## 2020-02-15 NOTE — Telephone Encounter (Signed)
Please have him increase Lasix to 40 qam/20 qpm x 4 days then back to 40 mg daily and send in Optivol reading.

## 2020-02-16 NOTE — Progress Notes (Signed)
Remote ICD transmission.   

## 2020-02-28 ENCOUNTER — Telehealth (HOSPITAL_COMMUNITY): Payer: Self-pay

## 2020-02-28 MED ORDER — FUROSEMIDE 40 MG PO TABS: ORAL_TABLET | ORAL | 2 refills | Status: DC

## 2020-02-28 NOTE — Telephone Encounter (Signed)
-----   Message from Larey Dresser, MD sent at 02/24/2020 11:00 PM EDT ----- Increase Lasix to 40 qam/20 qpm, BMET next week.

## 2020-02-28 NOTE — Telephone Encounter (Signed)
Pt already aware,has pending appt nxt week will recheck labs at that time,med list updated to reflect changes

## 2020-03-01 ENCOUNTER — Other Ambulatory Visit (HOSPITAL_COMMUNITY): Payer: Self-pay | Admitting: Internal Medicine

## 2020-03-02 ENCOUNTER — Other Ambulatory Visit (HOSPITAL_COMMUNITY): Payer: Medicare Other

## 2020-03-07 ENCOUNTER — Other Ambulatory Visit: Payer: Self-pay

## 2020-03-07 ENCOUNTER — Ambulatory Visit (HOSPITAL_COMMUNITY): Payer: Medicare Other

## 2020-03-07 ENCOUNTER — Ambulatory Visit (HOSPITAL_COMMUNITY)
Admission: RE | Admit: 2020-03-07 | Discharge: 2020-03-07 | Disposition: A | Discharge status: Home / Self Care | Payer: Medicare Other | Source: Ambulatory Visit | Attending: Cardiology | Admitting: Cardiology

## 2020-03-07 ENCOUNTER — Encounter (HOSPITAL_COMMUNITY): Payer: Self-pay | Admitting: Cardiology

## 2020-03-07 ENCOUNTER — Encounter (HOSPITAL_COMMUNITY): Payer: Self-pay

## 2020-03-07 VITALS — BP 112/80 | HR 54 | Wt 191.2 lb

## 2020-03-07 DIAGNOSIS — E038 Other specified hypothyroidism: Secondary | ICD-10-CM | POA: Insufficient documentation

## 2020-03-07 DIAGNOSIS — Z9861 Coronary angioplasty status: Secondary | ICD-10-CM

## 2020-03-07 DIAGNOSIS — Z8249 Family history of ischemic heart disease and other diseases of the circulatory system: Secondary | ICD-10-CM | POA: Insufficient documentation

## 2020-03-07 DIAGNOSIS — J849 Interstitial pulmonary disease, unspecified: Secondary | ICD-10-CM

## 2020-03-07 DIAGNOSIS — R942 Abnormal results of pulmonary function studies: Secondary | ICD-10-CM | POA: Diagnosis not present

## 2020-03-07 DIAGNOSIS — N183 Chronic kidney disease, stage 3 unspecified: Secondary | ICD-10-CM | POA: Diagnosis not present

## 2020-03-07 DIAGNOSIS — I739 Peripheral vascular disease, unspecified: Secondary | ICD-10-CM | POA: Insufficient documentation

## 2020-03-07 DIAGNOSIS — I5022 Chronic systolic (congestive) heart failure: Secondary | ICD-10-CM | POA: Diagnosis not present

## 2020-03-07 DIAGNOSIS — E785 Hyperlipidemia, unspecified: Secondary | ICD-10-CM | POA: Insufficient documentation

## 2020-03-07 DIAGNOSIS — Z7984 Long term (current) use of oral hypoglycemic drugs: Secondary | ICD-10-CM | POA: Diagnosis not present

## 2020-03-07 DIAGNOSIS — I255 Ischemic cardiomyopathy: Secondary | ICD-10-CM | POA: Insufficient documentation

## 2020-03-07 DIAGNOSIS — I252 Old myocardial infarction: Secondary | ICD-10-CM | POA: Diagnosis not present

## 2020-03-07 DIAGNOSIS — Z888 Allergy status to other drugs, medicaments and biological substances status: Secondary | ICD-10-CM | POA: Insufficient documentation

## 2020-03-07 DIAGNOSIS — M069 Rheumatoid arthritis, unspecified: Secondary | ICD-10-CM | POA: Diagnosis not present

## 2020-03-07 DIAGNOSIS — I2510 Atherosclerotic heart disease of native coronary artery without angina pectoris: Secondary | ICD-10-CM | POA: Diagnosis not present

## 2020-03-07 DIAGNOSIS — Z79899 Other long term (current) drug therapy: Secondary | ICD-10-CM | POA: Diagnosis not present

## 2020-03-07 DIAGNOSIS — Z7902 Long term (current) use of antithrombotics/antiplatelets: Secondary | ICD-10-CM | POA: Diagnosis not present

## 2020-03-07 DIAGNOSIS — I472 Ventricular tachycardia, unspecified: Secondary | ICD-10-CM

## 2020-03-07 DIAGNOSIS — Z87891 Personal history of nicotine dependence: Secondary | ICD-10-CM | POA: Insufficient documentation

## 2020-03-07 DIAGNOSIS — Z7982 Long term (current) use of aspirin: Secondary | ICD-10-CM | POA: Diagnosis not present

## 2020-03-07 DIAGNOSIS — I130 Hypertensive heart and chronic kidney disease with heart failure and stage 1 through stage 4 chronic kidney disease, or unspecified chronic kidney disease: Secondary | ICD-10-CM | POA: Insufficient documentation

## 2020-03-07 DIAGNOSIS — Z955 Presence of coronary angioplasty implant and graft: Secondary | ICD-10-CM | POA: Insufficient documentation

## 2020-03-07 DIAGNOSIS — Z95810 Presence of automatic (implantable) cardiac defibrillator: Secondary | ICD-10-CM | POA: Diagnosis not present

## 2020-03-07 MED ORDER — EMPAGLIFLOZIN 10 MG PO TABS: 10.0000 mg | ORAL_TABLET | Freq: Every day | ORAL | 0 refills | Status: DC

## 2020-03-07 MED ORDER — FUROSEMIDE 40 MG PO TABS: ORAL_TABLET | ORAL | 2 refills | Status: DC

## 2020-03-07 NOTE — Progress Notes (Signed)
Medication Samples have been provided to the patient.  Drug name: Jardiance       Strength: 10mg         Qty: 4 boxes (7 tablets each)  LOT: [id number]  Exp.Date: 8/23  Dosing instructions: 1 tablet Daily  The patient has been instructed regarding the correct time, dose, and frequency of taking this medication, including desired effects and most common side effects.   Nathan Pruitt 9:30 AM 03/07/2020

## 2020-03-07 NOTE — Patient Instructions (Addendum)
START Jardiance 10mg  (1 tablet) Daily   Your physician recommends that you get labs drawn in [place] in 10days. A prescription has been sent with you.  Your physician recommends that you schedule a follow-up virtual appointment in: 3 months   If you have any questions or concerns before your next appointment please send [place] a message through [place] or call our office at [phone].    TO LEAVE A MESSAGE FOR THE NURSE SELECT OPTION 2, PLEASE LEAVE A MESSAGE INCLUDING: . YOUR NAME . DATE OF BIRTH . CALL BACK NUMBER . REASON FOR CALL**this is important as we prioritize the call backs  YOU WILL RECEIVE A CALL BACK THE SAME DAY AS LONG AS YOU CALL BEFORE 4:00 PM

## 2020-03-07 NOTE — Addendum Note (Signed)
Encounter addended by: Larey Dresser, MD on: 03/07/2020 2:20 PM  Actions taken: Clinical Note Signed

## 2020-03-07 NOTE — Progress Notes (Addendum)
ID:  Nathan Pruitt, DOB 07-24-1945, MRN [id number]   Provider location: [facility] Type of Visit: Established patient   PCP:  Ronita Hipps, MD  Cardiologist: Dr. Aundra Dubin   History of Present Illness: Nathan Pruitt is a 74 y.o.with a history of CAD, PAD, ischemic CMP, and VT s/p ICD.  Echo in 2/17 showed EF 40% and regional WMAs.  In 3/18, he developed episodes of VT as well as increased dyspnea.  LHC/RHC was done, showing new occlusion of a large ramus (CTO, probably a month or so old at time of cath) and old CTO RCA with collaterals.  He had DES x 3 to ramus.  Filling pressures were optimized on RHC.  Amiodarone was increased to 200 mg daily from 100 mg daily.  No VT since PCI.   Echo in 6/19 showed EF 35-40% with wall motion abnormalities. Echo in 1/21 showed EF 40-45% with basal to mid inferolateral and inferior akinesis, mild MR, normal RV, PASP 35 mmHg.   LHC/RHC in 9/21 with normal filling pressures and cardiac output but 95% OM1 stenosis.  This was treated with DES to OM1.   He returns for followup of CAD and CHF.  No chest pain.  He is short of breath walking up hills and carrying grocery bags.  He can walk around [employer] without problem.  No orthopnea/PND.    Medtronic device interrogation: Thoracic impedance has trended down with fluid index > threshold.  No VT.    Labs (11/10): K 4.9, creatinine 1.4, LDL 70, HDL 31, LFTs normal Labs (3/11): K 4.4, creatinine 1.3, LDL 61, HDL 35 Labs (7/11): LDL 62, HDL 23 Labs (1/12): K 4.8, LFTs normal, creatinine 1.44, LDL 69, HDL 33 Labs (7/12): LFTs normal, LDL 81, HDL 36, HCT normal, K 4.3, creatinine 1.3, BNP 116, TSH normal Labs (1/13): LDL 39, HDl 31, LFTs normal, TSH  Labs (7/13): TSH normal, LFTs normal, LDL 69, HDL 40 Labs (8/13): K 4.5, creatinine 1.6 Labs (10/13): K 4.4, creatinine 1.4 Labs (3/14): LDL 108, HDL 30, LFTs normal Labs (9/14): K 4.2, creatinine 1.6, LFTs normal, LDL 114,  TSH 10.6 (elevated), free T4/free T3 normal Labs (2/15): K 4.6, creatinine 1.5, LFTs normal, TSH 7.6 (elevated), free T4 and free T3 normal Labs (3/15): K 3.6, creatinine 1.5, LFTs normal, TSH normal Labs (9/15): K 4.3, creatinine 1.6, LFTs normal, TSH normal, LDL 115, HDL 24 Labs (10/16): K 4.5, creatinine 1.5, LDL 90, HDL 35, hgb 12.8 Labs (11/16): Low free T3, normal TSH and free T4 Labs (12/16): K 5, creatinine 1.8, LFTs normal, TSH 8.6 (mildly elevated) Labs (1/17): Free T4/TSH/free T3 normal, LFTs normal Labs (2/17): K 4.4, creatinine 2.4, LDL 131, HDL 34 Labs (4/17): K 4.2, creatinine 1.3, LDL 67, HDL 35 Labs (9/17): K 4.9, creatinine 2.2, LDL 54, HDL 35, free T3 and T4 normal, hgb 12 Labs (4/18): K 5, creatinine 1.78, LFTs normal, TSH elevated but free T3 and free T4 normal.  Labs (6/18): LDL 52, HDL 35, TSH elevated 6.8, LFTs normal Labs (7/18): K 4.3, creatinine 1.68 Labs (10/18): TSH 5.6, free T3 normal, free T4 normal, LFTs normal, K 4.9, creatinine 1.4 Labs (11/18): K 4.5, creatinine 1.46, LFTs normal Labs (4/19): K 4.8, creatinine 1.6, LFTs normal Labs (6/19): TSH slightly elevated, free T3/T4 both normal, LFTs normal Labs (7/19): K 4.3, creatinine 1.3 Labs (10/19): LFTs normal, LDL 58, TGs 165, TSH 6.6 but free T4 and free T3 normal Labs (1/20):  K 4.7, creatinine 1.7 Labs (2/20): LFTs normal Labs (4/20): K 4.6, creatinine 1.4, LDL 77, HDL 36, hgb 12.2, TSH mild elevation 7.4, free T4 and free T3 normal.  Labs (7/20): K 3.8, creatinine 1.6, hgb 12.2, LDL 81, TGs 158, HDL 35, TSH 7.7 (mildly elevated), LFTs normal Labs (11/20): K 4.5, creatinine 1.7, NT-proBNP 1358, TSH 5.6 (mild elevation), LDL 88, LFTs normal Labs (4/21): K 4.3, creatinine 1.6, hgb 12.4, LFTs, normal, TGs 105, LDL 81, HDL 40, TSH 5.6 (mildly elevated), free T3 and free T4 normal Labs (6/21): K 4.7, creatinine 1.9, AST/ALT normal, TSH mildly elevated at 7.9, free T3 and T4 normal, LDL 87, TGs 118 Labs  (10/21): K 4.4, creatinine 1.8, LFTs normal, LDL 89, pro-BNP 1830, LFTs normal  Allergies (verified):  No Known Drug Allergies  Past Medical History: 1. Coronary artery disease. The patient reports history of silent MI in 1993.  This was likely an inferior MI (see below).  - The patient presented to [facility] in 5/10 with VT and mildly elevated cardiac enzymes. LHC (5/10):  Inferobasal dyskinesis with EF 35-40%.  There was chronic total occlusion of the mid RCA with good collaterals.  Luminals LCA.  This did not appear to be an acute cause of the 5/10 event.  - LHC (3/18): Known occlusion of RCA with collaterals, new occlusion of ramus.  DES x 3 to ramus.  - LHC (9/21): Known occlusion RCA with collaterals, 95% OMI treated with DES.  2. Hypertension.  3. Hyperlipidemia: Intolerant of higher doses of simvastatin, Crestor, Lipitor, pravastatin. Zetia caused constipation.  4. Remote tobacco abuse with 47-pack-year history, quitting in August 2009.  5. Peripheral arterial disease.  - Status post left-to-right fem-fem bypass performed in [place] in March 2009.  - Status post redo left-to-right fem-fem bypass performed by Dr. Scot Dock at [facility] in 2009.  - ABIs normal 3/15, ABIs normal 3/16, ABIs normal 3/17, ABIs normal 4/18, ABIs normal 4/19, ABIs normal 12/20.  6. Rheumatoid arthritis, on leflunomide.  7.  Ischemic cardiomyopathy:  EF 35-40% by LV-gram 5/10 with inferobasal dyskinesis.  Echo 5/10 showed EF 40% with mild LVH, no significant MR, inferobasal and posterobasal akinesis.  Echo (7/11): EF 50%, mild LVH, basal-mid inferoposterior akinesis.  Echo (7/12): EF 45-50% with basal anterolateral, basal posterior, and basal to mid inferior akinesis.  Echo (4/16): EF 40-45%, basal to mid inferolateral AK, basal inferior AK, basal to mid anterolateral HK.  Echo (2/17): EF 40% with basal to mid inferior akinesis, basal inferolateral aneurysm, mid inferolateral akinesis, basal anterolateral  hypokinesis, normal RV size and systolic function, PASP 25 mmHg.  - Echo (3/18) with EF 30-35%, moderate LV dilation, moderate MR, mildly decreased RV systolic function, PASP 51 mmmHg. - RHC (3/18): mean RA 4, PA 38/12, mean PCWP 17, CI 2.2.  - Echo (6/19): EF 35-40%, inferior/inferolateral/anterolateral WMAs, moderate MR likely infarct-related, normal RV size with mildly decreased systolic function.  - Echo (1/21): EF 35-40% with wall motion abnormalities. Echo was done today and reviewed, EF 40-45% with basal to mid inferolateral and inferior akinesis, mild MR, normal RV, PASP 35 mmHg.  - RHC (9/21): mean RA 4, PA 33/12, mean PCWP 9, CI 3.12 8.  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  He has a dual chamber Medtronic ICD.  9.  Atrial flutter:  Status post isthmus ablation 5/10.   10. Restrictive lung defect: PFTs (7/11): FVC 74%, FEV1 80%, ratio 75%, TLC 78%, DLCO  68%.  This suggests a mild restrictive defect and a mild obstructive defect.  He did have response to bronchodilator. These PFTs were significantly better than the report from Dr. Alcide Clever in [place] done prior.  He had last PFTs in 2/12 with no significant change.  - PFTs (9/21) with significantly decreased DLCO and concern for ILD/pulmonary fibrosis.  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  11.  Anxiety 12.  Chronic cough: No relief with change from ACEI to ARB or with trial of PPI.  13.  CKD: Stage 3.  14. Mitral regurgitation: Moderate on 6/19 echo, suspect infarct-related.  15. AAA: 3.1 cm AAA on abdominal US 12/20   Current Outpatient Medications  Medication Sig Dispense Refill  . amiodarone (PACERONE) 200 MG tablet TAKE 1 TABLET BY MOUTH EVERY DAY (Patient taking differently: Take 200 mg by mouth daily. ) 90 tablet 3  . aspirin EC 81 MG tablet Take 1 tablet (81 mg total) by mouth daily.    . carvedilol (COREG) 25 MG tablet TAKE 1 TABLET (25  MG TOTAL) BY MOUTH 2 (TWO) TIMES DAILY WITH A MEAL. 180 tablet 3  . chlorhexidine (PERIDEX) 0.12 % solution Use as directed 15 mLs in the mouth or throat daily as needed (irritation).   3  . clopidogrel (PLAVIX) 75 MG tablet TAKE 1 TABLET (75 MG TOTAL) BY MOUTH DAILY WITH BREAKFAST. (Patient taking differently: Take 75 mg by mouth daily. ) 90 tablet 2  . eplerenone (INSPRA) 25 MG tablet TAKE 1 TABLET BY MOUTH EVERY DAY (Patient taking differently: Take 25 mg by mouth daily. ) 90 tablet 3  . fluticasone (FLONASE) 50 MCG/ACT nasal spray Place 1-2 sprays into both nostrils daily as needed for allergies or rhinitis.    . furosemide (LASIX) 40 MG tablet Take 1 tablet (40 mg total) by mouth every morning AND 0.5 tablets (20 mg total) every evening. 90 tablet 2  . leflunomide (ARAVA) 20 MG tablet Take 20 mg by mouth daily.      . Melatonin 10 MG TABS Take 10 mg by mouth at bedtime as needed (sleep).     . simvastatin (ZOCOR) 20 MG tablet Take 1 tablet (20 mg total) by mouth at bedtime. 90 tablet 3  . valsartan (DIOVAN) 80 MG tablet TAKE 1 TABLET (80 MG TOTAL) BY MOUTH 2 (TWO) TIMES DAILY. 180 tablet 3  . empagliflozin (JARDIANCE) 10 MG TABS tablet Take 1 tablet (10 mg total) by mouth daily before breakfast. 14 tablet 0   No current facility-administered medications for this encounter.    Allergies:   Atorvastatin, Crestor [rosuvastatin calcium], Enalapril, Lisinopril, Losartan, and Telmisartan   Social History:  The patient  reports that he quit smoking about 12 years ago. He has a 47.00 pack-year smoking history. He has never used smokeless tobacco. He reports current alcohol use. He reports that he does not use drugs.   Family History:  The patient's family history includes Gastric cancer in his mother; Heart attack (age of onset: 50) in his brother; Peripheral vascular disease in his father; Throat cancer in his father.   ROS:  Please see the history of present illness.   All other systems are  personally reviewed and negative.   Exam:  BP 112/80   Pulse (!) 54   Wt 86.7 kg (191 lb 3.2 oz)   SpO2 96%   BMI 27.43 kg/m  General: NAD Neck: JVP 7-8 cm, no thyromegaly or thyroid nodule.  Lungs: Clear  to auscultation bilaterally with normal respiratory effort. CV: Nondisplaced PMI.  Heart regular S1/S2, no S3/S4, no murmur.  No peripheral edema.  No carotid bruit.  Normal pedal pulses.  Abdomen: Soft, nontender, no hepatosplenomegaly, no distention.  Skin: Intact without lesions or rashes.  Neurologic: Alert and oriented x 3.  Psych: Normal affect. Extremities: No clubbing or cyanosis.  HEENT: Normal.   Recent Labs: 05/11/2019: ALT 15; TSH 6.271 01/28/2020: BUN 31; Creatinine, Ser 1.80; Hemoglobin 10.5; Hemoglobin 10.2; Platelets 287; Potassium 4.3; Potassium 4.2; Sodium 140; Sodium 141  Personally reviewed   Wt Readings from Last 3 Encounters:  03/07/20 86.7 kg (191 lb 3.2 oz)  01/28/20 83.9 kg (185 lb)  12/27/19 83.5 kg (184 lb)     ASSESSMENT AND PLAN:  1. Chronic systolic CHF: Ischemic cardiomyopathy.  Medtronic ICD.  Echo (1/21) with EF 40-45%, stable. NYHA class II symptoms. Optivol suggests volume overload though he is not markedly volume overloaded on exam.  - Start Jardiance 10 mg daily.  BMET 10 days.  - Keep Lasix at 40 qam/20 qpm, hopefully will get some extra diuresis with Jardiance.  - Continue Coreg 25 mg bid.  - We have discussed transitioning to [facility], he wants to stay on valsartan (says Delene Loll would send him into donut hole too fast).  Continue valsartan at 80 mg bid. Creatinine 1.8 on recent check, stable.    - He is on eplerenone 25 mg daily.  He became hyperkalemic on eplerenone 50 mg daily so I will not increase any further today.    2. CAD: Denies chest pain.  Had DES x 3 to ramus in 3/18 and DES to OM1 in 9/21.   - Continue ASA 81 mg daily, clopidogrel, Zocor 20 mg daily, and Coreg/ARB as above.   3. Hyperlipidemia: Stable on simvastatin without  myalgias.  Has not been able to tolerate higher dose of simvastatin or other statins. Unable to tolerate Zetia due to constipation.  Recent LDL was 89 which is above goal.  He refuses to start Repatha due to donut hole concerns.   4. Hx of VT: Has been on amiodarone 200 mg daily (increased from 100 mg daily after last VT event). Has ICD.  - Subclinical hypothyroidism (has had mildly elevated TSH with normal free T3 and free T4, recently checked).   - Recent LFTs normal.  - Knows to get a yearly eye exam - See below regarding ?amiodarone lung toxicity.  5. PAD: He denies claudication.  Normal ABIs in 12/20, followed by [facility].   6. AAA: 3.1 cm in 12/20, followed by [facility].  7. CKD: Stage 3.  Creatinine stable at 1.8 recently.  Adding Jardiance, should help 8. Pulmonary: PFTs were done with increased dyspnea and amidorone use, concerning for ILD/pulmonary fibrosis.  High resolution CT chest done today concerning for ILD.  I worry about amiodarone-induced lung toxicity.  - Hold amiodarone for now.  - Check ESR.  - Needs appointment with pulmonary for assessment of ILD.  - Will message Dr. Rayann Heman about alternative to amiodarone, ?mexiletine.   followup in 3 months.   Signed, Loralie Champagne, MD  03/07/2020  [facility] [address] [facility] [place] [address] [phone] (office) [phone] (fax)  I discussed antiarrhythmics issues with Dr. Rayann Heman.  We will Mr Edler stop amiodarone and start sotalol 80 mg bid after he is off amiodarone.  He will need to return in 1 week for ECG monitoring and BMET/Mg.  Loralie Champagne 03/08/2020 11:31 AM

## 2020-03-08 ENCOUNTER — Encounter (HOSPITAL_COMMUNITY): Payer: Self-pay

## 2020-03-08 ENCOUNTER — Telehealth (HOSPITAL_COMMUNITY): Payer: Self-pay | Admitting: Pharmacist

## 2020-03-08 DIAGNOSIS — I472 Ventricular tachycardia, unspecified: Secondary | ICD-10-CM

## 2020-03-08 DIAGNOSIS — I5022 Chronic systolic (congestive) heart failure: Secondary | ICD-10-CM

## 2020-03-08 MED ORDER — SOTALOL HCL 80 MG PO TABS: 80.0000 mg | ORAL_TABLET | Freq: Two times a day (BID) | ORAL | 3 refills | Status: DC

## 2020-03-08 NOTE — Addendum Note (Signed)
Addended by: Scarlette Calico on: 03/08/2020 04:32 PM   Modules accepted: Orders

## 2020-03-08 NOTE — Addendum Note (Signed)
Encounter addended by: Larey Dresser, MD on: 03/08/2020 11:35 AM  Actions taken: Clinical Note Signed

## 2020-03-08 NOTE — Telephone Encounter (Signed)
Spoke w/pt and sch appt for EKG and labs on 11/10 at 8:30

## 2020-03-08 NOTE — Telephone Encounter (Signed)
Per Dr. Aundra Dubin, patient will discontinue amiodarone and start sotalol 80 mg BID. New prescription sent to pharmacy. Patient aware. He will get EKG and BMET/Mg next week (scheduling pending).   Audry Riles, PharmD, BCPS, BCCP, CPP [facility] Pharmacist [phone]

## 2020-03-09 ENCOUNTER — Other Ambulatory Visit (HOSPITAL_COMMUNITY): Payer: Self-pay | Admitting: Cardiology

## 2020-03-13 NOTE — Telephone Encounter (Signed)
Called and spoke w/pt, he did start the Sotalol and will come in Wed 11/10 for EKG and labs, he is going to hold off on Jardiance for now due to cost. Advised once we have EKG and lab results we will call him with Dr Claris Gladden recommendations

## 2020-03-15 ENCOUNTER — Ambulatory Visit (HOSPITAL_COMMUNITY)
Admission: RE | Admit: 2020-03-15 | Discharge: 2020-03-15 | Disposition: A | Discharge status: Home / Self Care | Payer: Medicare Other | Source: Ambulatory Visit | Attending: Cardiology | Admitting: Cardiology

## 2020-03-15 ENCOUNTER — Telehealth (HOSPITAL_COMMUNITY): Payer: Self-pay

## 2020-03-15 ENCOUNTER — Encounter (HOSPITAL_COMMUNITY): Payer: Self-pay

## 2020-03-15 ENCOUNTER — Other Ambulatory Visit: Payer: Self-pay

## 2020-03-15 VITALS — BP 102/68 | HR 52 | Wt 190.2 lb

## 2020-03-15 DIAGNOSIS — I5022 Chronic systolic (congestive) heart failure: Secondary | ICD-10-CM

## 2020-03-15 DIAGNOSIS — I472 Ventricular tachycardia, unspecified: Secondary | ICD-10-CM

## 2020-03-15 LAB — BASIC METABOLIC PANEL
Anion gap: 8 (ref 5–15)
BUN: 32 mg/dL — ABNORMAL HIGH (ref 8–23)
CO2: 27 mmol/L (ref 22–32)
Calcium: 8.6 mg/dL — ABNORMAL LOW (ref 8.9–10.3)
Chloride: 103 mmol/L (ref 98–111)
Creatinine, Ser: 1.96 mg/dL — ABNORMAL HIGH (ref 0.61–1.24)
GFR, Estimated: 35 mL/min — ABNORMAL LOW (ref >60)
Glucose, Bld: 109 mg/dL — ABNORMAL HIGH (ref 70–99)
Potassium: 4.6 mmol/L (ref 3.5–5.1)
Sodium: 138 mmol/L (ref 135–145)

## 2020-03-15 LAB — MAGNESIUM: Magnesium: 2.2 mg/dL (ref 1.7–2.4)

## 2020-03-15 LAB — SEDIMENTATION RATE: Sed Rate: 45 mm/hr — ABNORMAL HIGH (ref 0–16)

## 2020-03-15 MED ORDER — CARVEDILOL 12.5 MG PO TABS
12.5000 mg | ORAL_TABLET | Freq: Two times a day (BID) | ORAL | 3 refills | Status: DC
Start: 2020-03-15 — End: 2021-01-01

## 2020-03-15 NOTE — Progress Notes (Signed)
Pt in for EKG and labs, no complaints, will send data to Dr Aundra Dubin for review and call pt later today with any changes.

## 2020-03-15 NOTE — Telephone Encounter (Signed)
Patient advised and verbalized understanding. Med list updated to reflect changes    Per DM Bradycardic, decrease Coreg to 12.5 mg bid.

## 2020-03-15 NOTE — Telephone Encounter (Signed)
-----   Message from Larey Dresser, MD sent at 03/15/2020 11:38 AM EST -----   ----- Message ----- From: Scarlette Calico, RN Sent: 03/15/2020   8:31 AM EST To: Larey Dresser, MD

## 2020-03-15 NOTE — Progress Notes (Signed)
Bradycardic, decrease Coreg to 12.5 mg bid.

## 2020-03-15 NOTE — Progress Notes (Signed)
Patient advised and verbalized understanding. Med list updated to reflect changes.  ? ?

## 2020-03-15 NOTE — Patient Instructions (Signed)
We will call you later today or tomorrow with results and Dr Claris Gladden recommendation

## 2020-03-20 ENCOUNTER — Encounter (HOSPITAL_COMMUNITY): Payer: Self-pay | Admitting: *Deleted

## 2020-04-06 ENCOUNTER — Other Ambulatory Visit: Payer: Self-pay

## 2020-04-06 ENCOUNTER — Ambulatory Visit (INDEPENDENT_AMBULATORY_CARE_PROVIDER_SITE_OTHER): Payer: Medicare Other | Admitting: Pulmonary Disease

## 2020-04-06 ENCOUNTER — Encounter: Payer: Self-pay | Admitting: Pulmonary Disease

## 2020-04-06 VITALS — BP 100/60 | HR 50 | Temp 97.3°F | Ht 69.5 in | Wt 181.2 lb

## 2020-04-06 DIAGNOSIS — J849 Interstitial pulmonary disease, unspecified: Secondary | ICD-10-CM

## 2020-04-06 DIAGNOSIS — J84112 Idiopathic pulmonary fibrosis: Secondary | ICD-10-CM

## 2020-04-06 DIAGNOSIS — I2510 Atherosclerotic heart disease of native coronary artery without angina pectoris: Secondary | ICD-10-CM

## 2020-04-06 DIAGNOSIS — Z9861 Coronary angioplasty status: Secondary | ICD-10-CM | POA: Diagnosis not present

## 2020-04-06 NOTE — Progress Notes (Signed)
Nathan Pruitt    [id number]    [birth year]  Primary Care Physician:Holt, Gara Kroner, MD  Referring Physician: Larey Dresser, MD [phone] [place],  [place] [address]  Chief complaint: Consult for interstitial lung disease  HPI: 74 year old ex-smoker with history of coronary artery disease, peripheral artery disease, ischemic cardiomyopathy, V. tach status post ICD, hypertension, hyperlipidemia.  Referred by Dr. Aundra Dubin for evaluation of pulmonary fibrosis, interstitial lung disease.  He has history of V. tach in 2018, diagnosed with coronary artery disease status post stenting, cardiomyopathy with EF 40%.  He is being on amiodarone since 2018.  He has progressive dyspnea and underwent repeat repeat cath in September 2021 with in-stent restenosis just treated with balloon angioplasty.  There is also concern for amiodarone toxicity given changes on PFTs and CT scan and amiodarone has been held and referred to pulmonary for further evaluation  Has chronic dyspnea on exertion.  He admits that he is a slug and does not exercise very well.  Denies any cough, sputum production, fevers, chills  Pets: No pets Occupation: Retired Dance movement psychotherapist Exposures: No known exposures.  No mold, hot tub, Jacuzzi.  No feather pillows or comforters Smoking history: 47-pack-year smoker.  Quit in 2013 Travel history: No significant travel history Relevant family history: No significant travel history of lung disease  Outpatient Encounter Medications as of 04/06/2020  Medication Sig  . aspirin EC 81 MG tablet Take 1 tablet (81 mg total) by mouth daily.  . carvedilol (COREG) 12.5 MG tablet Take 1 tablet (12.5 mg total) by mouth 2 (two) times daily with a meal.  . chlorhexidine (PERIDEX) 0.12 % solution Use as directed 15 mLs in the mouth or throat daily as needed (irritation).   . clopidogrel (PLAVIX) 75 MG tablet TAKE 1 TABLET (75 MG TOTAL) BY MOUTH DAILY WITH BREAKFAST.   [name] eplerenone (INSPRA) 25 MG tablet TAKE 1 TABLET BY MOUTH EVERY DAY  . fluticasone (FLONASE) 50 MCG/ACT nasal spray Place 1-2 sprays into both nostrils daily as needed for allergies or rhinitis.  . furosemide (LASIX) 40 MG tablet Take 1 tablet (40 mg total) by mouth every morning AND 0.5 tablets (20 mg total) every evening.  . leflunomide (ARAVA) 20 MG tablet Take 20 mg by mouth daily.    . Melatonin 10 MG TABS Take 10 mg by mouth at bedtime as needed (sleep).   . simvastatin (ZOCOR) 20 MG tablet Take 1 tablet (20 mg total) by mouth at bedtime.  . sotalol (BETAPACE) 80 MG tablet Take 1 tablet (80 mg total) by mouth 2 (two) times daily.  . valsartan (DIOVAN) 80 MG tablet TAKE 1 TABLET (80 MG TOTAL) BY MOUTH 2 (TWO) TIMES DAILY.   No facility-administered encounter medications on file as of 04/06/2020.    Allergies as of 04/06/2020 - Review Complete 04/06/2020  Allergen Reaction Noted  . Atorvastatin Other (See Comments) 08/19/2011  . Crestor [rosuvastatin calcium] Other (See Comments) 08/19/2011  . Enalapril Other (See Comments) 12/23/2016  . Lisinopril Cough 12/16/2016  . Losartan Other (See Comments) 10/01/2012  . Telmisartan Other (See Comments) 08/23/2013    Past Medical History:  Diagnosis Date  . Abnormal PFTs 7/11   FVC 74%, FEV1 80%, ratio 75%, TLC 78%, DLCO 68%. this suggests a mild restrictive and obstructive defect. pt did have a response to bronchodilator. these PFTs were significantly better than the report from Dr. Alcide Clever in [place] done prior.   [name]  AICD (automatic cardioverter/defibrillator) present   . Anxiety   . Atrial flutter ([place])    s/p isthmus ablation 5/10  . CAD (coronary artery disease)    hx of silent MI in 1993. likely an inferior MI. hx of 2D cardiogram in 3/09 showing EF of 40%. hx of myoview in [place] 3/09-nml. presented to [facility]/10 with VT and mildly elevated cardiac enzymes LHC (5/10): inferobasal dyskinesis with EF 35-40%. was chronic total occlusion of mid  RCA with good collaterals. luminals LCA. this does not appear to be an acute cause of the 5/10 event  . Cancer ([place])    skin  . CHF (congestive heart failure) ([place])   . Chronic lower back pain   . HLD (hyperlipidemia)   . HTN (hypertension)   . Ischemic cardiomyopathy    EF 35-40% by LV-gram 5/10 with inferobasal dyskinesis. echo 5/10 showed EF 40% w/mild LVH, no sig. MR, inferobasal and posterobasal akinesis. echo (7/11): EF 50%, mild LVH, basal-mid inferoposterior akenesis  . Migraine    "visual migraine; maybe 12/year" (07/16/2016)  . PAD (peripheral artery disease) (H[facility])    s/p L-to-R fem-fem bypass performed by Dr. Scot Dock at [facility] in 2009   . Rheumatoid arthritis (H[facility])    on leflunomide  . Silent myocardial infarction [facility]) 1993   "silent"  . Tobacco abuse    47 pack year hx; quit 8/09  . Ventricular tachycardia ([place])    likely scar-mediated. 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 He has duel chamber Medtronic ICD    Past Surgical History:  Procedure Laterality Date  . CARDIAC DEFIBRILLATOR PLACEMENT  09/22/2008  . CATARACT EXTRACTION W/ INTRAOCULAR LENS  IMPLANT, BILATERAL Bilateral 05/2016 - 06/2016  . CORONARY BALLOON ANGIOPLASTY N/A 01/28/2020   Procedure: CORONARY BALLOON ANGIOPLASTY;  Surgeon: Jettie Booze, MD;  Location: [facility];  Service: Cardiovascular;  Laterality: N/A;  . CORONARY CTO INTERVENTION  07/16/2016  . CORONARY CTO INTERVENTION N/A 07/16/2016   Procedure: Coronary CTO Intervention;  Surgeon: Belva Crome, MD;  Location: [facility];  Service: Cardiovascular;  Laterality: N/A;  . EP IMPLANTABLE DEVICE  09/22/08   Medtronic, ICD Model Number:  D274DRG, ICD Serial Number: [id number]  . FEMORAL ARTERY - FEMORAL ARTERY BYPASS GRAFT  march 2009   left to right bypass, first @ [facility], second at [facility] by Dr Scot Dock  . ICD GENERATOR CHANGEOUT N/A 11/12/2016   Procedure: ICD Generator Changeout;   Surgeon: Thompson Grayer, MD;  Location: [facility];  Service: Cardiovascular;  Laterality: N/A;  . RIGHT/LEFT HEART CATH AND CORONARY ANGIOGRAPHY N/A 07/12/2016   Procedure: Right/Left Heart Cath and Coronary Angiography;  Surgeon: Larey Dresser, MD;  Location: [facility];  Service: Cardiovascular;  Laterality: N/A;  . RIGHT/LEFT HEART CATH AND CORONARY ANGIOGRAPHY N/A 01/28/2020   Procedure: RIGHT/LEFT HEART CATH AND CORONARY ANGIOGRAPHY;  Surgeon: Larey Dresser, MD;  Location: [facility];  Service: Cardiovascular;  Laterality: N/A;  . TONSILLECTOMY    . VASECTOMY    . VENTRICULAR ABLATION SURGERY  09/2008    Family History  Problem Relation Age of Onset  . Gastric cancer Mother   . Throat cancer Father   . Peripheral vascular disease Father   . Heart attack Brother 38    Social History   Socioeconomic History  . Marital status: Single    Spouse name: Not on file  . Number of children: Not on file  .  Years of education: Not on file  . Highest education level: Not on file  Occupational History  . Not on file  Tobacco Use  . Smoking status: Former Smoker    Packs/day: 1.00    Years: 47.00    Pack years: 47.00    Quit date: 2009    Years since quitting: 12.9  . Smokeless tobacco: Never Used  Vaping Use  . Vaping Use: Never used  Substance and Sexual Activity  . Alcohol use: Yes    Alcohol/week: 0.0 standard drinks    Comment: 07/15/2016 "nothing for the last couple years"  . Drug use: No    Comment: prior   . Sexual activity: Not on file  Other Topics Concern  . Not on file  Social History Narrative   Single; gets minimal exercise; 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 originally from [place] Strain:   . Difficulty of Paying Living Expenses: Not on file  Food Insecurity:   . Worried About Charity fundraiser in the Last Year: Not on file  . Ran Out of Food in the Last Year: Not on file  Transportation  Needs:   . Lack of Transportation (Medical): Not on file  . Lack of Transportation (Non-Medical): Not on file  Physical Activity:   . Days of Exercise per Week: Not on file  . Minutes of Exercise per Session: Not on file  Stress:   . Feeling of Stress : Not on file  Social Connections:   . Frequency of Communication with Friends and Family: Not on file  . Frequency of Social Gatherings with Friends and Family: Not on file  . Attends Religious Services: Not on file  . Active Member of Clubs or Organizations: Not on file  . Attends Archivist Meetings: Not on file  . Marital Status: Not on file  Intimate Partner Violence:   . Fear of Current or Ex-Partner: Not on file  . Emotionally Abused: Not on file  . Physically Abused: Not on file  . Sexually Abused: Not on file    Review of systems: Review of Systems  Constitutional: Negative for fever and chills.  HENT: Negative.   Eyes: Negative for blurred vision.  Respiratory: as per HPI  Cardiovascular: Negative for chest pain and palpitations.  Gastrointestinal: Negative for vomiting, diarrhea, blood per rectum. Genitourinary: Negative for dysuria, urgency, frequency and hematuria.  Musculoskeletal: Negative for myalgias, back pain and joint pain.  Skin: Negative for itching and rash.  Neurological: Negative for dizziness, tremors, focal weakness, seizures and loss of consciousness.  Endo/Heme/Allergies: Negative for environmental allergies.  Psychiatric/Behavioral: Negative for depression, suicidal ideas and hallucinations.  All other systems reviewed and are negative.  Physical Exam: Blood pressure 100/60, pulse (!) 50, temperature (!) 97.3 F (36.3 C), temperature source Temporal, height 5' 9.5" (1.765 m), weight 181 lb 3.2 oz (82.2 kg), SpO2 95 %. Gen:      No acute distress HEENT:  EOMI, sclera anicteric Neck:     No masses; no thyromegaly Lungs:    Clear to auscultation bilaterally; normal respiratory effort CV:          Regular rate and rhythm; no murmurs Abd:      + bowel sounds; soft, non-tender; no palpable masses, no distension Ext:    No edema; adequate peripheral perfusion Skin:      Warm and dry; no rash Neuro: alert and oriented x 3 Psych: normal mood and affect  Data Reviewed: Imaging: High-res CT chest 03/07/2020-patchy areas of groundglass attenuation with septal thickening, reticulation and traction bronchiectasis with basal gradient.  No honeycombing.  Probable UIP pattern.  I have reviewed the images personally.  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  He does not have significant exposure history or connective tissue disease symptoms. 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 However I agree with stopping amiodarone if he does not absolutely need it for his history of V. tach.  I had an extensive discussion with patient.  He is not a great candidate for lung biopsy or invasive procedures besides patient would like to avoid these. We will get labs for CTD evaluation and ILD questionnaire for exposure history.  I will see him back in clinic in 1 to 2 months and discuss results and next steps including antifibrotic therapy. He is skeptical of using antifibrotics and similar expensive medications if it does not improve his quality of life.  Plan/Recommendations: CTD serologies, ILD questionnaire  This appointment required 45 minutes of patient care (this includes precharting, chart review, review of results, face-to-face care, etc.).  Marshell Garfinkel MD [facility] 04/06/2020, 9:17 AM  CC: Larey Dresser, MD

## 2020-04-06 NOTE — Patient Instructions (Signed)
I have reviewed your CT scan and history I do not think amiodarone is causing the issue.  I think you may have a condition called idiopathic pulmonary fibrosis We will get some labs to rule out alternate diagnosis  This is progressive scarring process of the lung that may be treated with medications called Ofev or Esbriet.  You can look up literature on this online.  I see back in clinic in 1 to 2 months for review of tests and plan for next steps.

## 2020-04-09 LAB — ANA: Anti Nuclear Antibody (ANA): NEGATIVE

## 2020-04-09 LAB — ANCA SCREEN W REFLEX TITER: ANCA Screen: NEGATIVE

## 2020-04-09 LAB — SJOGRENS SYNDROME-B EXTRACTABLE NUCLEAR ANTIBODY: SSB (La) (ENA) Antibody, IgG: <1 AI

## 2020-04-09 LAB — ANTI-SCLERODERMA ANTIBODY: Scleroderma (Scl-70) (ENA) Antibody, IgG: <1 AI

## 2020-04-09 LAB — SJOGRENS SYNDROME-A EXTRACTABLE NUCLEAR ANTIBODY: SSA (Ro) (ENA) Antibody, IgG: <1 AI

## 2020-04-12 ENCOUNTER — Encounter (HOSPITAL_COMMUNITY): Payer: Self-pay

## 2020-04-12 LAB — HYPERSENSITIVITY PNEUMONITIS
A. Pullulans Abs: NEGATIVE
A.Fumigatus #1 Abs: NEGATIVE
Micropolyspora faeni, IgG: NEGATIVE
Pigeon Serum Abs: NEGATIVE
Thermoact. Saccharii: POSITIVE — AB
Thermoactinomyces vulgaris, IgG: NEGATIVE

## 2020-04-14 ENCOUNTER — Other Ambulatory Visit (HOSPITAL_COMMUNITY): Payer: Self-pay | Admitting: Cardiology

## 2020-04-14 NOTE — Telephone Encounter (Signed)
I would recommend holding off returning to the amiodarone for now.

## 2020-04-14 NOTE — Telephone Encounter (Signed)
Dr. Vaughan Browner please advise on patient mychart message   Looks like only one of the labs came back out of range: Thermoact. Saccharii     Positive         Negative      A.  Is this something I should be looking into? If this is some kind of fungus should I be having my house checked out for mold or something else?... Additionally, can I retrun to Amiodarone if need be?  Thanks Nathan Pruitt

## 2020-04-14 NOTE — Telephone Encounter (Signed)
Yes this is a kind of fungus that can cause an allergic reaction in the lungs and lung issues called Farmer's lung.  It is usually found and contaminated ventilation systems and in decaying compost, hay, and sugar cane (bagasse).  Please have your house checked by an environmental specialist.  I will review in detail at time of return visit.

## 2020-04-17 ENCOUNTER — Telehealth (HOSPITAL_COMMUNITY): Payer: Self-pay | Admitting: *Deleted

## 2020-04-17 MED ORDER — AMIODARONE HCL 200 MG PO TABS: 100.0000 mg | ORAL_TABLET | Freq: Every day | ORAL | 3 refills | Status: DC

## 2020-04-17 NOTE — Telephone Encounter (Signed)
Per Dr Aundra Dubin pt can not tolerate Sotalol and has stopped the medication. He would like pt to restart Amiodarone at 100 mg Daily.  Discussed w/Lauren, Pharm D she states if pt has not already started Amio wait 2 days then start it, if he has already started that is fine  Called and discussed w/pt, he states he decreased Sotalol to only once a Day on Fri and took last dose yesterday AM, today he did take Amio 200 mg. He reports he is feeling better since decreasing the Sotalol and hopes to return to his baseline in a couple of days after being off it completely. He is agreeable to take only 100 mg Daily of Amio starting tomorrow. New RX sent to pharmacy.

## 2020-04-22 LAB — MYOMARKER 3 PLUS PROFILE (RDL)

## 2020-04-24 ENCOUNTER — Encounter (HOSPITAL_COMMUNITY): Payer: Self-pay

## 2020-05-08 ENCOUNTER — Other Ambulatory Visit (HOSPITAL_COMMUNITY): Payer: Self-pay | Admitting: Cardiology

## 2020-05-09 NOTE — Telephone Encounter (Signed)
To ensure  2022 insurance information will be entered correctly, I reached out to front office rep at [facility] to assist.

## 2020-05-10 ENCOUNTER — Telehealth: Payer: Self-pay

## 2020-05-10 NOTE — Telephone Encounter (Signed)
Would call him tomorrow to recheck on him.

## 2020-05-10 NOTE — Telephone Encounter (Signed)
Pt reports he feels weak, woozy and lightheaded today.  Assisted pt with manual transmission from ICD.  Normal device function, presenting HR ASVS 68.  No arrhythmia episodes recorded.  Pt had recent changes in medications including per his report, D/C sotalol, restart Amiodarone and decrease Carvedilol.    Since device check was normal, advised pt to check BP.  Result was 95/62,   A few minutes later pt insisted on rechecking BP resulting 98/63 with HR 56  (device programmed LRL of 50).  Pt has already taken morning meds today including Amiodarone 100mg  , Carvedilol 12.5mg , Valsartan 80mg , furosemide 40mg , eplerenone 25mg ,    Pt states he needs to go out around noon today and is hoping he can feel better by then.    Advised pt to drink a large glass of water and rest with feet up, I would forward info to Dr. office.

## 2020-05-15 ENCOUNTER — Encounter (HOSPITAL_COMMUNITY): Payer: Self-pay

## 2020-05-16 ENCOUNTER — Ambulatory Visit (INDEPENDENT_AMBULATORY_CARE_PROVIDER_SITE_OTHER): Payer: Medicare Other

## 2020-05-16 DIAGNOSIS — I472 Ventricular tachycardia, unspecified: Secondary | ICD-10-CM

## 2020-05-17 LAB — CUP PACEART REMOTE DEVICE CHECK
Battery Remaining Longevity: 85 mo
Battery Voltage: 2.99 V
Brady Statistic AP VP Percent: 0.19 %
Brady Statistic AP VS Percent: 0.33 %
Brady Statistic AS VP Percent: 22.55 %
Brady Statistic AS VS Percent: 76.93 %
Brady Statistic RA Percent Paced: 0.52 %
Brady Statistic RV Percent Paced: 22.66 %
Date Time Interrogation Session: 20220111012306
HighPow Impedance: 36 Ohm
HighPow Impedance: 49 Ohm
Implantable Lead Implant Date: 20100520
Implantable Lead Implant Date: 20100520
Implantable Lead Location: 753859
Implantable Lead Location: 753860
Implantable Lead Model: 5076
Implantable Lead Model: 6947
Implantable Pulse Generator Implant Date: 20180710
Lead Channel Impedance Value: 323 Ohm
Lead Channel Impedance Value: 342 Ohm
Lead Channel Impedance Value: 380 Ohm
Lead Channel Pacing Threshold Amplitude: 0.5 V
Lead Channel Pacing Threshold Amplitude: 1.125 V
Lead Channel Pacing Threshold Pulse Width: 0.4 ms
Lead Channel Pacing Threshold Pulse Width: 0.4 ms
Lead Channel Sensing Intrinsic Amplitude: 2.125 mV
Lead Channel Sensing Intrinsic Amplitude: 2.125 mV
Lead Channel Sensing Intrinsic Amplitude: 6.125 mV
Lead Channel Sensing Intrinsic Amplitude: 6.125 mV
Lead Channel Setting Pacing Amplitude: 1.5 V
Lead Channel Setting Pacing Amplitude: 2.5 V
Lead Channel Setting Pacing Pulse Width: 0.4 ms
Lead Channel Setting Sensing Sensitivity: 0.3 mV

## 2020-05-18 ENCOUNTER — Encounter (HOSPITAL_COMMUNITY): Payer: Self-pay

## 2020-06-01 NOTE — Progress Notes (Signed)
Remote ICD transmission.   

## 2020-06-02 ENCOUNTER — Ambulatory Visit: Payer: Medicare Other | Admitting: Pulmonary Disease

## 2020-06-02 ENCOUNTER — Other Ambulatory Visit: Payer: Self-pay

## 2020-06-02 ENCOUNTER — Encounter: Payer: Self-pay | Admitting: Pulmonary Disease

## 2020-06-02 VITALS — BP 98/62 | HR 64 | Temp 97.7°F | Ht 69.5 in | Wt 163.2 lb

## 2020-06-02 DIAGNOSIS — J849 Interstitial pulmonary disease, unspecified: Secondary | ICD-10-CM | POA: Diagnosis not present

## 2020-06-02 DIAGNOSIS — J84112 Idiopathic pulmonary fibrosis: Secondary | ICD-10-CM

## 2020-06-02 NOTE — Addendum Note (Signed)
Addended by: Coralie Keens on: 06/02/2020 09:29 AM   Modules accepted: Orders

## 2020-06-02 NOTE — Patient Instructions (Addendum)
I have reviewed your images and CT scan shows interstitial lung disease and scarring in the lung Per our discussion today we'll hold off on treatment We'll schedule pulmonary function test and follow-up in clinic on the same day in 1 year.  We'll check your oxygen levels on exertion today. Prescribe home oxygen concentrator at 2 L.

## 2020-06-02 NOTE — Progress Notes (Signed)
Nathan Pruitt    [id number]    Aug 30, 1945  Primary Care Physician:Holt, Gara Kroner, MD  Referring Physician: Ronita Hipps, MD [place] [id number],   Chief complaint: Follow-up for interstitial lung disease, pulmonary fibrosis.  HPI: 75 year old ex-smoker with history of coronary artery disease, peripheral artery disease, ischemic cardiomyopathy, V. tach status post ICD, hypertension, hyperlipidemia.  Referred by Dr. Aundra Dubin for evaluation of pulmonary fibrosis, interstitial lung disease.  He has history of V. tach in 2018, diagnosed with coronary artery disease status post stenting, cardiomyopathy with EF 40%.  He is being on amiodarone since 2018.  He has progressive dyspnea and underwent repeat repeat cath in September 2021 with in-stent restenosis just treated with balloon angioplasty.  There is also concern for amiodarone toxicity given changes on PFTs and CT scan and amiodarone has been held and referred to pulmonary for further evaluation  Has chronic dyspnea on exertion.  He admits that he is a slug and does not exercise very well.  Denies any cough, sputum production, fevers, chills  Pets: No pets Occupation: Retired Dance movement psychotherapist Exposures: No known exposures.  No mold, hot tub, Jacuzzi.  No feather pillows or comforters Smoking history: 47-pack-year smoker.  Quit in 2013 Travel history: No significant travel history Relevant family history: No significant travel history of lung disease  Interim history: Continues to have mild dyspnea on exertion We have reviewed labs which show positive hypersensitive panel. He does not note any exposures at home and is planning on getting an environmental test  Wants to have a portable concentrator to use when he exerts himself.  Outpatient Encounter Medications as of 06/02/2020  Medication Sig  . amiodarone (PACERONE) 200 MG tablet Take 0.5 tablets (100 mg total) by mouth daily.  [name] aspirin EC 81  MG tablet Take 1 tablet (81 mg total) by mouth daily.  . carvedilol (COREG) 12.5 MG tablet Take 1 tablet (12.5 mg total) by mouth 2 (two) times daily with a meal.  . chlorhexidine (PERIDEX) 0.12 % solution Use as directed 15 mLs in the mouth or throat daily as needed (irritation).   . clopidogrel (PLAVIX) 75 MG tablet TAKE 1 TABLET (75 MG TOTAL) BY MOUTH DAILY WITH BREAKFAST.  [name] eplerenone (INSPRA) 25 MG tablet TAKE 1 TABLET BY MOUTH EVERY DAY  . fluticasone (FLONASE) 50 MCG/ACT nasal spray Place 1-2 sprays into both nostrils daily as needed for allergies or rhinitis.  . furosemide (LASIX) 40 MG tablet Take 1 tablet (40 mg total) by mouth every morning AND 0.5 tablets (20 mg total) every evening.  . leflunomide (ARAVA) 20 MG tablet Take 20 mg by mouth daily.  . Melatonin 10 MG TABS Take 10 mg by mouth at bedtime as needed (sleep).   . simvastatin (ZOCOR) 20 MG tablet TAKE 1 TABLET BY MOUTH EVERYDAY AT BEDTIME  . valsartan (DIOVAN) 80 MG tablet TAKE 1 TABLET (80 MG TOTAL) BY MOUTH 2 (TWO) TIMES DAILY.   No facility-administered encounter medications on file as of 06/02/2020.    Physical Exam: Blood pressure 100/60, pulse (!) 50, temperature (!) 97.3 F (36.3 C), temperature source Temporal, height 5' 9.5" (1.765 m), weight 181 lb 3.2 oz (82.2 kg), SpO2 95 %. Gen:      No acute distress HEENT:  EOMI, sclera anicteric Neck:     No masses; no thyromegaly Lungs:    Clear to auscultation bilaterally; normal respiratory effort CV:  Regular rate and rhythm; no murmurs Abd:      + bowel sounds; soft, non-tender; no palpable masses, no distension Ext:    No edema; adequate peripheral perfusion Skin:      Warm and dry; no rash Neuro: alert and oriented x 3 Psych: normal mood and affect  Data Reviewed: Imaging: High-res CT chest 03/07/2020-patchy areas of groundglass attenuation with septal thickening, reticulation and traction bronchiectasis with basal gradient.  No honeycombing.  Probable  UIP pattern.  I have reviewed the images personally.  PFTs: 01/28/2020 FVC 3.28 [9 7%], FEV1 2.45 [10 9%], F/F 75, TLC 5.62 [39%], DLCO 13.54 [53%] Minimal restriction with moderate-severe diffusion defect  Labs: Sed rate 03/15/2020-45  CTD serologies 04/06/2020-negative except for positive hypersensitivity panel for Thermoact. 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  He does not have significant exposure history or connective tissue disease symptoms. 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 However I agree with stopping amiodarone if he does not absolutely need it for his history of V. tach. Hypersensitivity panel is positive but he does not note any exposures. He plans on getting an advanced recheck of his has  I had an extensive discussion with patient.  He is not a great candidate for lung biopsy or invasive procedures besides patient would like to avoid these. Discussed antifibrotic therapy but he would like to hold off due to expense.  He is skeptical of using antifibrotics and similar expensive medications if it does not improve his quality of life.  He would like to have a home concentrator as he feels that it will help with his exercise capacity.  Follow-up in 1 year with high-res CT. He prefers to get this done at the time of his return visit with Dr. Hedy Jacob order PFTs and follow-up in 1 year.  Plan/Recommendations: High-res CT, PFTs in 1 year Home concentrator for exertion.  Marshell Garfinkel MD [facility] 06/02/2020, 8:53 AM  CC: Ronita Hipps, MD

## 2020-06-02 NOTE — Addendum Note (Signed)
Addended by: Coralie Keens on: 06/02/2020 09:26 AM   Modules accepted: Orders

## 2020-06-05 ENCOUNTER — Telehealth (HOSPITAL_COMMUNITY): Payer: Self-pay | Admitting: *Deleted

## 2020-06-05 DIAGNOSIS — I472 Ventricular tachycardia: Secondary | ICD-10-CM | POA: Diagnosis not present

## 2020-06-05 NOTE — Telephone Encounter (Signed)
Per Dr.McLean hold lasix, eplerenone, entresto, jardiance, and valsartan.  K-5.9 and creatinine 4. Please have patient go to the ED. Pt aware and agreeable pt said he would go to [facility].  I called and spoke to the emergency triage nurse and gave information and let them know pt was en route.

## 2020-06-06 DIAGNOSIS — I472 Ventricular tachycardia: Secondary | ICD-10-CM

## 2020-06-06 DIAGNOSIS — N179 Acute kidney failure, unspecified: Secondary | ICD-10-CM

## 2020-06-06 DIAGNOSIS — I2510 Atherosclerotic heart disease of native coronary artery without angina pectoris: Secondary | ICD-10-CM

## 2020-06-06 DIAGNOSIS — I429 Cardiomyopathy, unspecified: Secondary | ICD-10-CM

## 2020-06-06 DIAGNOSIS — E875 Hyperkalemia: Secondary | ICD-10-CM

## 2020-06-06 DIAGNOSIS — U071 COVID-19: Secondary | ICD-10-CM

## 2020-06-07 ENCOUNTER — Encounter (HOSPITAL_COMMUNITY): Payer: Self-pay

## 2020-06-07 DIAGNOSIS — I429 Cardiomyopathy, unspecified: Secondary | ICD-10-CM | POA: Diagnosis not present

## 2020-06-07 DIAGNOSIS — I2510 Atherosclerotic heart disease of native coronary artery without angina pectoris: Secondary | ICD-10-CM | POA: Diagnosis not present

## 2020-06-07 DIAGNOSIS — E875 Hyperkalemia: Secondary | ICD-10-CM | POA: Diagnosis not present

## 2020-06-07 DIAGNOSIS — N179 Acute kidney failure, unspecified: Secondary | ICD-10-CM | POA: Diagnosis not present

## 2020-06-08 ENCOUNTER — Encounter (HOSPITAL_COMMUNITY): Payer: Self-pay | Admitting: Cardiology

## 2020-06-08 ENCOUNTER — Telehealth (HOSPITAL_COMMUNITY): Payer: Medicare Other | Admitting: Cardiology

## 2020-06-08 ENCOUNTER — Ambulatory Visit (HOSPITAL_COMMUNITY)
Admission: RE | Admit: 2020-06-08 | Discharge: 2020-06-08 | Disposition: A | Discharge status: Home / Self Care | Payer: Medicare Other | Source: Ambulatory Visit | Attending: Cardiology | Admitting: Cardiology

## 2020-06-08 ENCOUNTER — Encounter (HOSPITAL_COMMUNITY): Payer: Self-pay

## 2020-06-08 ENCOUNTER — Other Ambulatory Visit: Payer: Self-pay

## 2020-06-08 VITALS — BP 127/60 | HR 62 | Wt 175.0 lb

## 2020-06-08 DIAGNOSIS — I5022 Chronic systolic (congestive) heart failure: Secondary | ICD-10-CM

## 2020-06-08 DIAGNOSIS — I472 Ventricular tachycardia, unspecified: Secondary | ICD-10-CM

## 2020-06-08 MED ORDER — FUROSEMIDE 40 MG PO TABS
40.0000 mg | ORAL_TABLET | Freq: Every day | ORAL | 3 refills | Status: DC
Start: 2020-06-08 — End: 2020-08-16

## 2020-06-08 NOTE — Patient Instructions (Addendum)
NO Eplerenone or Valsartan for now  Take Lasix 20mg  (1/2 tablet) today and then START Lasix 40mg  (1 tablet) daily  Your physician recommends that you weigh, daily, at the same time every day, and in the same amount of clothing. Please record your daily weights on the handout provided and have it at your next appointment.  Your physician recommends that you have repeat labs drawn at [facility] on Monday. A prescription has been faxed to the hospital and will be mailed to you.  Your physician recommends that you schedule a follow-up appointment in: 1 week with a virtual visit ([place])  If you have any questions or concerns before your next appointment please send [place] a message through [place] or call our office at [phone].    TO LEAVE A MESSAGE FOR THE NURSE SELECT OPTION 2, PLEASE LEAVE A MESSAGE INCLUDING: . YOUR NAME . DATE OF BIRTH . CALL BACK NUMBER . REASON FOR CALL**this is important as we prioritize the call backs  YOU WILL RECEIVE A CALL BACK THE SAME DAY AS LONG AS YOU CALL BEFORE 4:00 PM

## 2020-06-10 ENCOUNTER — Encounter (HOSPITAL_COMMUNITY): Payer: Self-pay

## 2020-06-10 NOTE — Progress Notes (Signed)
Heart Failure TeleHealth Note  Due to national recommendations of social distancing due to [place] 19, Audio/video telehealth visit is felt to be most appropriate for this patient at this time.  See MyChart message from today for patient consent regarding telehealth for [facility].  Date:  06/10/2020   ID:  Nathan Pruitt, DOB 1945-08-22, MRN [id number]  Location: Home  Provider location: [facility] Type of Visit: Established patient   PCP:  Ronita Hipps, MD  Cardiologist:  Dr. Aundra Dubin   History of Present Illness: Nathan Pruitt is a 75 y.o. male presents via audio/video conferencing for a telehealth visit today.     he denies symptoms worrisome for COVID 19.   Nathan Pruitt is a 75 y.o.with a history of CAD, PAD, ischemic CMP, and VT s/p ICD.  Echo in 2/17 showed EF 40% and regional WMAs.  In 3/18, he developed episodes of VT as well as increased dyspnea.  LHC/RHC was done, showing new occlusion of a large ramus (CTO, probably a month or so old at time of cath) and old CTO RCA with collaterals.  He had DES x 3 to ramus.  Filling pressures were optimized on RHC.  Amiodarone was increased to 200 mg daily from 100 mg daily.  No VT since PCI.   Echo in 6/19 showed EF 35-40% with wall motion abnormalities. Echo in 1/21 showed EF 40-45% with basal to mid inferolateral and inferior akinesis, mild MR, normal RV, PASP 35 mmHg.   LHC/RHC in 9/21 with normal filling pressures and cardiac output but 95% OM1 stenosis.  This was treated with DES to OM1.   He has been following with Dr. Vaughan Browner with idiopathic pulmonary fibrosis.  I was concerned that he could have amiodarone -related lung toxicity but Dr. Vaughan Browner did not think this was very likely.  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    Labs drawn in 1/22 showed K up to 5.9 and creatinine 4.  He was sent to the ER at [facility].  He was found to have COVID-19 and was  admitted. He had a productive cough and sore throat.  He was thought to be dehydrated and got IV fluid.  Creatinine 2.3 at discharge.  Lasix, valsartan, and eplerenone were stopped.  Other meds were continued.   Patient is now home.  Stamina poor, but he thinks he is at baseline.  Cough and sore throat have resolved.  Short of breath walking 100 yards+.  No orthopnea/PND.  Weight is up 10 lbs since he went into the hospital.    Labs (11/10): K 4.9, creatinine 1.4, LDL 70, HDL 31, LFTs normal Labs (3/11): K 4.4, creatinine 1.3, LDL 61, HDL 35 Labs (7/11): LDL 62, HDL 23 Labs (1/12): K 4.8, LFTs normal, creatinine 1.44, LDL 69, HDL 33 Labs (7/12): LFTs normal, LDL 81, HDL 36, HCT normal, K 4.3, creatinine 1.3, BNP 116, TSH normal Labs (1/13): LDL 39, HDl 31, LFTs normal, TSH  Labs (7/13): TSH normal, LFTs normal, LDL 69, HDL 40 Labs (8/13): K 4.5, creatinine 1.6 Labs (10/13): K 4.4, creatinine 1.4 Labs (3/14): LDL 108, HDL 30, LFTs normal Labs (9/14): K 4.2, creatinine 1.6, LFTs normal, LDL 114, TSH 10.6 (elevated), free T4/free T3 normal Labs (2/15): K 4.6, creatinine 1.5, LFTs normal, TSH 7.6 (elevated), free T4 and free T3 normal Labs (3/15): K 3.6, creatinine 1.5, LFTs normal, TSH normal Labs (9/15): K 4.3, creatinine 1.6,  LFTs normal, TSH normal, LDL 115, HDL 24 Labs (10/16): K 4.5, creatinine 1.5, LDL 90, HDL 35, hgb 12.8 Labs (11/16): Low free T3, normal TSH and free T4 Labs (12/16): K 5, creatinine 1.8, LFTs normal, TSH 8.6 (mildly elevated) Labs (1/17): Free T4/TSH/free T3 normal, LFTs normal Labs (2/17): K 4.4, creatinine 2.4, LDL 131, HDL 34 Labs (4/17): K 4.2, creatinine 1.3, LDL 67, HDL 35 Labs (9/17): K 4.9, creatinine 2.2, LDL 54, HDL 35, free T3 and T4 normal, hgb 12 Labs (4/18): K 5, creatinine 1.78, LFTs normal, TSH elevated but free T3 and free T4 normal.  Labs (6/18): LDL 52, HDL 35, TSH elevated 6.8, LFTs normal Labs (7/18): K 4.3, creatinine 1.68 Labs (10/18): TSH  5.6, free T3 normal, free T4 normal, LFTs normal, K 4.9, creatinine 1.4 Labs (11/18): K 4.5, creatinine 1.46, LFTs normal Labs (4/19): K 4.8, creatinine 1.6, LFTs normal Labs (6/19): TSH slightly elevated, free T3/T4 both normal, LFTs normal Labs (7/19): K 4.3, creatinine 1.3 Labs (10/19): LFTs normal, LDL 58, TGs 165, TSH 6.6 but free T4 and free T3 normal Labs (1/20): K 4.7, creatinine 1.7 Labs (2/20): LFTs normal Labs (4/20): K 4.6, creatinine 1.4, LDL 77, HDL 36, hgb 12.2, TSH mild elevation 7.4, free T4 and free T3 normal.  Labs (7/20): K 3.8, creatinine 1.6, hgb 12.2, LDL 81, TGs 158, HDL 35, TSH 7.7 (mildly elevated), LFTs normal Labs (11/20): K 4.5, creatinine 1.7, NT-proBNP 1358, TSH 5.6 (mild elevation), LDL 88, LFTs normal Labs (4/21): K 4.3, creatinine 1.6, hgb 12.4, LFTs, normal, TGs 105, LDL 81, HDL 40, TSH 5.6 (mildly elevated), free T3 and free T4 normal Labs (6/21): K 4.7, creatinine 1.9, AST/ALT normal, TSH mildly elevated at 7.9, free T3 and T4 normal, LDL 87, TGs 118 Labs (10/21): K 4.4, creatinine 1.8, LFTs normal, LDL 89, pro-BNP 1830, LFTs normal Labs (11/21): ESR 48, K 4.6, creatinine 1.96 Labs (12/21): ANA negative Labs (1/22): K 5.9, creatinine 4 => 2.3  Allergies (verified):  No Known Drug Allergies  Past Medical History: 1. Coronary artery disease. The patient reports history of silent MI in 1993.  This was likely an inferior MI (see below).  - The patient presented to [facility] in 5/10 with VT and mildly elevated cardiac enzymes. LHC (5/10):  Inferobasal dyskinesis with EF 35-40%.  There was chronic total occlusion of the mid RCA with good collaterals.  Luminals LCA.  This did not appear to be an acute cause of the 5/10 event.  - LHC (3/18): Known occlusion of RCA with collaterals, new occlusion of ramus.  DES x 3 to ramus.  - LHC (9/21): Known occlusion RCA with collaterals, 95% OMI treated with DES.  2. Hypertension.  3. Hyperlipidemia: Intolerant of  higher doses of simvastatin, Crestor, Lipitor, pravastatin. Zetia caused constipation.  4. Remote tobacco abuse with 47-pack-year history, quitting in August 2009.  5. Peripheral arterial disease.  - Status post left-to-right fem-fem bypass performed in [place] in March 2009.  - Status post redo left-to-right fem-fem bypass performed by Dr. Scot Dock at [facility] in 2009.  - ABIs normal 3/15, ABIs normal 3/16, ABIs normal 3/17, ABIs normal 4/18, ABIs normal 4/19, ABIs normal 12/20.  6. Rheumatoid arthritis, on leflunomide.  7.  Ischemic cardiomyopathy:  EF 35-40% by LV-gram 5/10 with inferobasal dyskinesis.  Echo 5/10 showed EF 40% with mild LVH, no significant MR, inferobasal and posterobasal akinesis.  Echo (7/11): EF 50%, mild LVH, basal-mid inferoposterior akinesis.  Echo (7/12): EF 45-50%  with basal anterolateral, basal posterior, and basal to mid inferior akinesis.  Echo (4/16): EF 40-45%, basal to mid inferolateral AK, basal inferior AK, basal to mid anterolateral HK.  Echo (2/17): EF 40% with basal to mid inferior akinesis, basal inferolateral aneurysm, mid inferolateral akinesis, basal anterolateral hypokinesis, normal RV size and systolic function, PASP 25 mmHg.  - Echo (3/18) with EF 30-35%, moderate LV dilation, moderate MR, mildly decreased RV systolic function, PASP 51 mmmHg. - RHC (3/18): mean RA 4, PA 38/12, mean PCWP 17, CI 2.2.  - Echo (6/19): EF 35-40%, inferior/inferolateral/anterolateral WMAs, moderate MR likely infarct-related, normal RV size with mildly decreased systolic function.  - Echo (1/21): EF 35-40% with wall motion abnormalities. Echo was done today and reviewed, EF 40-45% with basal to mid inferolateral and inferior akinesis, mild MR, normal RV, PASP 35 mmHg.  - RHC (9/21): mean RA 4, PA 33/12, mean PCWP 9, CI 3.12 8.  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  He has a dual chamber Medtronic ICD.  9.  Atrial  flutter:  Status post isthmus ablation 5/10.   10. Restrictive lung defect: PFTs (7/11): FVC 74%, FEV1 80%, ratio 75%, TLC 78%, DLCO 68%.  This suggests a mild restrictive defect and a mild obstructive defect.  He did have response to bronchodilator. These PFTs were significantly better than the report from Dr. Alcide Clever in [place] done prior.  He had last PFTs in 2/12 with no significant change.  - PFTs (9/21) with significantly decreased DLCO and concern for ILD/pulmonary fibrosis.  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  11.  Anxiety 12.  Chronic cough: No relief with change from ACEI to ARB or with trial of PPI.  13.  CKD: Stage 3.  14. Mitral regurgitation: Moderate on 6/19 echo, suspect infarct-related.  15. AAA: 3.1 cm AAA on abdominal US 12/20  16. COVID-19 infection: 1/22.   Current Outpatient Medications  Medication Sig Dispense Refill  . amiodarone (PACERONE) 200 MG tablet Take 0.5 tablets (100 mg total) by mouth daily. 90 tablet 3  . aspirin EC 81 MG tablet Take 1 tablet (81 mg total) by mouth daily.    . carvedilol (COREG) 12.5 MG tablet Take 1 tablet (12.5 mg total) by mouth 2 (two) times daily with a meal. 90 tablet 3  . chlorhexidine (PERIDEX) 0.12 % solution Use as directed 15 mLs in the mouth or throat daily as needed (irritation).   3  . clopidogrel (PLAVIX) 75 MG tablet TAKE 1 TABLET (75 MG TOTAL) BY MOUTH DAILY WITH BREAKFAST. 90 tablet 2  . fluticasone (FLONASE) 50 MCG/ACT nasal spray Place 1-2 sprays into both nostrils daily as needed for allergies or rhinitis.    [name] leflunomide (ARAVA) 20 MG tablet Take 20 mg by mouth daily.    . Melatonin 10 MG TABS Take 10 mg by mouth at bedtime as needed (sleep).     . simvastatin (ZOCOR) 20 MG tablet TAKE 1 TABLET BY MOUTH EVERYDAY AT BEDTIME 90 tablet 3  . furosemide (LASIX) 40 MG tablet Take 1 tablet (40 mg total) by mouth daily. 30 tablet 3   No current facility-administered medications for this  encounter.    Allergies:   Atorvastatin, Crestor [rosuvastatin calcium], Enalapril, Lisinopril, Losartan, and Telmisartan   Social History:  The patient  reports that he quit smoking about 13 years ago. He has a 47.00 pack-year smoking history. He has never used smokeless tobacco.  He reports current alcohol use. He reports that he does not use drugs.   Family History:  The patient's family history includes Gastric cancer in his mother; Heart attack (age of onset: 87) in his brother; Peripheral vascular disease in his father; Throat cancer in his father.   ROS:  Please see the history of present illness.   All other systems are personally reviewed and negative.   Exam:  (Video/Tele Health Call; Exam is subjective and or/visual.) General:  Speaks in full sentences. No resp difficulty. Neck: JVP 8 cm Lungs: Normal respiratory effort with conversation.  Abdomen: Non-distended per patient report Extremities: Pt denies edema. Neuro: Alert & oriented x 3.   Recent Labs: 01/28/2020: Hemoglobin 10.5; Hemoglobin 10.2; Platelets 287 03/15/2020: BUN 32; Creatinine, Ser 1.96; Magnesium 2.2; Potassium 4.6; Sodium 138  Personally reviewed   Wt Readings from Last 3 Encounters:  06/08/20 79.4 kg (175 lb)  06/02/20 74 kg (163 lb 3.2 oz)  04/06/20 82.2 kg (181 lb 3.2 oz)     ASSESSMENT AND PLAN:  1. Chronic systolic CHF: Ischemic cardiomyopathy.  Medtronic ICD.  Echo (1/21) with EF 40-45%, stable. NYHA class II symptoms. He has been off Lasix and has had IV fluid, weight is up.  NYHA class III, probably mild volume overload by virtual exam.  Creatinine was trending down, 2.3 when he left the hospital.  Need to avoid hypotension.  - Continue Jardiance 10 mg daily.    - Restart Lasix 20 mg today then 40 mg daily starting tomorrow (lower dose).  BMET next week.   - Continue Coreg 25 mg bid.  - If creatinine is stable on BMET Monday, I will start him on valsartan 40 mg bid.  Eventually will need start  back eplerenone, possibly at lower dose.    2. CAD: Denies chest pain.  Had DES x 3 to ramus in 3/18 and DES to OM1 in 9/21.   - Continue ASA 81 mg daily, clopidogrel, Zocor 20 mg daily.   3. Hyperlipidemia: Stable on simvastatin without myalgias.  Has not been able to tolerate higher dose of simvastatin or other statins. Unable to tolerate Zetia due to constipation.  Recent LDL was 89 which is above goal.  He refuses to start Repatha due to donut hole concerns.   4. Hx of VT: Has been on amiodarone 200 mg daily (increased from 100 mg daily after last VT event). Has ICD.  He did not tolerate sotalol.  - Subclinical hypothyroidism (has had mildly elevated TSH with normal free T3 and free T4, recently checked).   - Recent LFTs normal.  - Knows to get a yearly eye exam - See below regarding ?amiodarone lung toxicity.  5. PAD: He denies claudication.  Normal ABIs in 12/20, followed by [facility].   6. AAA: 3.1 cm in 12/20, followed by [facility].  7. CKD: Stage 3.  Recent AKI, creatinine back to 2.3 today. Suspect overdiuresed/low BP in setting of COVID-19 infection.  - Refer for neurology evaluation.  8. Pulmonary: PFTs were done with increased dyspnea and amiodorone use, concerning for ILD/pulmonary fibrosis.  High resolution CT chest done today concerning for ILD.  I have worried about amiodarone-induced lung toxicity. 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  He was offered antifibrotic treatment but wants to hold off for now.  - Continue amidorone 100 mg daily, will send for CMET, TSH, CBC.    followup next week with a virtual appt.  COVID screen The patient does not have any symptoms that suggest any further testing/ screening at this time.  Social distancing reinforced today.  Patient Risk: After full review of this patients clinical status, I feel that they are at moderate risk for cardiac decompensation at this time.  Relevant cardiac medications were  reviewed at length with the patient today. The patient does not have concerns regarding their medications at this time.   Today, I have spent 18 minutes with the patient with telehealth technology discussing the above issues .    Signed, Loralie Champagne, MD  06/10/2020  [facility] [address] [facility] [place] [address] [phone] (office) [phone] (fax)

## 2020-06-13 MED ORDER — VALSARTAN 40 MG PO TABS: 40.0000 mg | ORAL_TABLET | Freq: Two times a day (BID) | ORAL | 3 refills | Status: DC

## 2020-06-13 NOTE — Telephone Encounter (Signed)
Pt aware to restart valsartan 40mg  BID  Labs were also reviewed by Ginnie Smart -LDL not at goal, would recommend to add zetia but per DM note it gives him constipation. Could increase zocor to 40 mg with CoQ10 in decrease myalgias,  if patient is agreeable.  Reviewed LDL results and pt is NOT agreeable to increase and or adding another medication at this time. Recent adverse event with multiple medication change and pt is hesitant to make so many changes at once. Will discuss further at follow up with dr Aundra Dubin

## 2020-06-14 ENCOUNTER — Other Ambulatory Visit (HOSPITAL_COMMUNITY): Payer: Self-pay | Admitting: Cardiology

## 2020-06-15 ENCOUNTER — Telehealth: Payer: Self-pay

## 2020-06-15 NOTE — Telephone Encounter (Signed)
Carelink remote received and noted Optivol is elevated and thoracic impedance is below reference line. Routing to Dr. Aundra Pruitt to view considering patient has follow up 06/16/20 with him.

## 2020-06-16 ENCOUNTER — Ambulatory Visit (HOSPITAL_COMMUNITY)
Admission: RE | Admit: 2020-06-16 | Discharge: 2020-06-16 | Disposition: A | Discharge status: Home / Self Care | Payer: Medicare Other | Source: Ambulatory Visit | Attending: Cardiology | Admitting: Cardiology

## 2020-06-16 ENCOUNTER — Encounter (HOSPITAL_COMMUNITY): Payer: Self-pay | Admitting: Cardiology

## 2020-06-16 ENCOUNTER — Other Ambulatory Visit: Payer: Self-pay

## 2020-06-16 VITALS — BP 124/71 | HR 62 | Wt 165.8 lb

## 2020-06-16 DIAGNOSIS — I472 Ventricular tachycardia, unspecified: Secondary | ICD-10-CM

## 2020-06-16 DIAGNOSIS — I5022 Chronic systolic (congestive) heart failure: Secondary | ICD-10-CM | POA: Diagnosis not present

## 2020-06-18 NOTE — Progress Notes (Signed)
Heart Failure TeleHealth Note  Due to national recommendations of social distancing due to [place] 19, Audio/video telehealth visit is felt to be most appropriate for this patient at this time.  See MyChart message from today for patient consent regarding telehealth for [facility].  Date:  06/16/2020   ID:  Nathan Pruitt, DOB [birth year], MRN [id number]  Location: Home  Provider location: [place] [facility] Type of Visit: Established patient   PCP:  Ronita Hipps, MD  Cardiologist:  Dr. Aundra Dubin   History of Present Illness: Nathan Pruitt is a 75 y.o. male presents via audio/video conferencing for a telehealth visit today.     he denies symptoms worrisome for COVID 19.   Nathan Pruitt is a 75 y.o.with a history of CAD, PAD, ischemic CMP, and VT s/p ICD.  Echo in 2/17 showed EF 40% and regional WMAs.  In 3/18, he developed episodes of VT as well as increased dyspnea.  LHC/RHC was done, showing new occlusion of a large ramus (CTO, probably a month or so old at time of cath) and old CTO RCA with collaterals.  He had DES x 3 to ramus.  Filling pressures were optimized on RHC.  Amiodarone was increased to 200 mg daily from 100 mg daily.  No VT since PCI.   Echo in 6/19 showed EF 35-40% with wall motion abnormalities. Echo in 1/21 showed EF 40-45% with basal to mid inferolateral and inferior akinesis, mild MR, normal RV, PASP 35 mmHg.   LHC/RHC in 9/21 with normal filling pressures and cardiac output but 95% OM1 stenosis.  This was treated with DES to OM1.   He has been following with Dr. Vaughan Browner with idiopathic pulmonary fibrosis.  I was concerned that he could have amiodarone -related lung toxicity but Dr. Vaughan Browner did not think this was very likely.  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    Labs drawn in 1/22 showed K up to 5.9 and creatinine 4.  He was sent to the ER at [facility].  He was found to have COVID-19 and was  admitted. He had a productive cough and sore throat.  He was thought to be dehydrated and got IV fluid.  Creatinine 2.3 at discharge.  Lasix, valsartan, and eplerenone were stopped.  Other meds were continued.   Valsartan 40 mg bid and Lasix 40 mg daily have now been restarted.  No dyspnea walking around the house but not very active.  Weight trending down since restarting Lasix.  No chest pain.  No orthopnea/PND.    Medtronic device interrogation: Fluid index > threshold but thoracic impedance trending back up.   Labs (11/10): K 4.9, creatinine 1.4, LDL 70, HDL 31, LFTs normal Labs (3/11): K 4.4, creatinine 1.3, LDL 61, HDL 35 Labs (7/11): LDL 62, HDL 23 Labs (1/12): K 4.8, LFTs normal, creatinine 1.44, LDL 69, HDL 33 Labs (7/12): LFTs normal, LDL 81, HDL 36, HCT normal, K 4.3, creatinine 1.3, BNP 116, TSH normal Labs (1/13): LDL 39, HDl 31, LFTs normal, TSH  Labs (7/13): TSH normal, LFTs normal, LDL 69, HDL 40 Labs (8/13): K 4.5, creatinine 1.6 Labs (10/13): K 4.4, creatinine 1.4 Labs (3/14): LDL 108, HDL 30, LFTs normal Labs (9/14): K 4.2, creatinine 1.6, LFTs normal, LDL 114, TSH 10.6 (elevated), free T4/free T3 normal Labs (2/15): K 4.6, creatinine 1.5, LFTs normal, TSH 7.6 (elevated), free T4 and free T3 normal Labs (3/15): K 3.6, creatinine 1.5,  LFTs normal, TSH normal Labs (9/15): K 4.3, creatinine 1.6, LFTs normal, TSH normal, LDL 115, HDL 24 Labs (10/16): K 4.5, creatinine 1.5, LDL 90, HDL 35, hgb 12.8 Labs (11/16): Low free T3, normal TSH and free T4 Labs (12/16): K 5, creatinine 1.8, LFTs normal, TSH 8.6 (mildly elevated) Labs (1/17): Free T4/TSH/free T3 normal, LFTs normal Labs (2/17): K 4.4, creatinine 2.4, LDL 131, HDL 34 Labs (4/17): K 4.2, creatinine 1.3, LDL 67, HDL 35 Labs (9/17): K 4.9, creatinine 2.2, LDL 54, HDL 35, free T3 and T4 normal, hgb 12 Labs (4/18): K 5, creatinine 1.78, LFTs normal, TSH elevated but free T3 and free T4 normal.  Labs (6/18): LDL 52, HDL 35,  TSH elevated 6.8, LFTs normal Labs (7/18): K 4.3, creatinine 1.68 Labs (10/18): TSH 5.6, free T3 normal, free T4 normal, LFTs normal, K 4.9, creatinine 1.4 Labs (11/18): K 4.5, creatinine 1.46, LFTs normal Labs (4/19): K 4.8, creatinine 1.6, LFTs normal Labs (6/19): TSH slightly elevated, free T3/T4 both normal, LFTs normal Labs (7/19): K 4.3, creatinine 1.3 Labs (10/19): LFTs normal, LDL 58, TGs 165, TSH 6.6 but free T4 and free T3 normal Labs (1/20): K 4.7, creatinine 1.7 Labs (2/20): LFTs normal Labs (4/20): K 4.6, creatinine 1.4, LDL 77, HDL 36, hgb 12.2, TSH mild elevation 7.4, free T4 and free T3 normal.  Labs (7/20): K 3.8, creatinine 1.6, hgb 12.2, LDL 81, TGs 158, HDL 35, TSH 7.7 (mildly elevated), LFTs normal Labs (11/20): K 4.5, creatinine 1.7, NT-proBNP 1358, TSH 5.6 (mild elevation), LDL 88, LFTs normal Labs (4/21): K 4.3, creatinine 1.6, hgb 12.4, LFTs, normal, TGs 105, LDL 81, HDL 40, TSH 5.6 (mildly elevated), free T3 and free T4 normal Labs (6/21): K 4.7, creatinine 1.9, AST/ALT normal, TSH mildly elevated at 7.9, free T3 and T4 normal, LDL 87, TGs 118 Labs (10/21): K 4.4, creatinine 1.8, LFTs normal, LDL 89, pro-BNP 1830, LFTs normal Labs (11/21): ESR 48, K 4.6, creatinine 1.96 Labs (12/21): ANA negative Labs (1/22): K 5.9, creatinine 4 => 2.3 Labs (2/22): K 4.6, creatinine 2.1, LDL 77, LFTs normal  Allergies (verified):  No Known Drug Allergies  Past Medical History: 1. Coronary artery disease. The patient reports history of silent MI in 1993.  This was likely an inferior MI (see below).  - The patient presented to [facility] in 5/10 with VT and mildly elevated cardiac enzymes. LHC (5/10):  Inferobasal dyskinesis with EF 35-40%.  There was chronic total occlusion of the mid RCA with good collaterals.  Luminals LCA.  This did not appear to be an acute cause of the 5/10 event.  - LHC (3/18): Known occlusion of RCA with collaterals, new occlusion of ramus.  DES x 3 to  ramus.  - LHC (9/21): Known occlusion RCA with collaterals, 95% OMI treated with DES.  2. Hypertension.  3. Hyperlipidemia: Intolerant of higher doses of simvastatin, Crestor, Lipitor, pravastatin. Zetia caused constipation.  4. Remote tobacco abuse with 47-pack-year history, quitting in August 2009.  5. Peripheral arterial disease.  - Status post left-to-right fem-fem bypass performed in [place] in March 2009.  - Status post redo left-to-right fem-fem bypass performed by Dr. Scot Dock at [facility] in 2009.  - ABIs normal 3/15, ABIs normal 3/16, ABIs normal 3/17, ABIs normal 4/18, ABIs normal 4/19, ABIs normal 12/20.  6. Rheumatoid arthritis, on leflunomide.  7.  Ischemic cardiomyopathy:  EF 35-40% by LV-gram 5/10 with inferobasal dyskinesis.  Echo 5/10 showed EF 40% with mild LVH, no significant  MR, inferobasal and posterobasal akinesis.  Echo (7/11): EF 50%, mild LVH, basal-mid inferoposterior akinesis.  Echo (7/12): EF 45-50% with basal anterolateral, basal posterior, and basal to mid inferior akinesis.  Echo (4/16): EF 40-45%, basal to mid inferolateral AK, basal inferior AK, basal to mid anterolateral HK.  Echo (2/17): EF 40% with basal to mid inferior akinesis, basal inferolateral aneurysm, mid inferolateral akinesis, basal anterolateral hypokinesis, normal RV size and systolic function, PASP 25 mmHg.  - Echo (3/18) with EF 30-35%, moderate LV dilation, moderate MR, mildly decreased RV systolic function, PASP 51 mmmHg. - RHC (3/18): mean RA 4, PA 38/12, mean PCWP 17, CI 2.2.  - Echo (6/19): EF 35-40%, inferior/inferolateral/anterolateral WMAs, moderate MR likely infarct-related, normal RV size with mildly decreased systolic function.  - Echo (1/21): EF 35-40% with wall motion abnormalities. Echo was done today and reviewed, EF 40-45% with basal to mid inferolateral and inferior akinesis, mild MR, normal RV, PASP 35 mmHg.  - RHC (9/21): mean RA 4, PA 33/12, mean PCWP 9, CI 3.12 8.  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  He has a dual chamber Medtronic ICD.  9.  Atrial flutter:  Status post isthmus ablation 5/10.   10. Restrictive lung defect: PFTs (7/11): FVC 74%, FEV1 80%, ratio 75%, TLC 78%, DLCO 68%.  This suggests a mild restrictive defect and a mild obstructive defect.  He did have response to bronchodilator. These PFTs were significantly better than the report from Dr. Alcide Clever in [place] done prior.  He had last PFTs in 2/12 with no significant change.  - PFTs (9/21) with significantly decreased DLCO and concern for ILD/pulmonary fibrosis.  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  11.  Anxiety 12.  Chronic cough: No relief with change from ACEI to ARB or with trial of PPI.  13.  CKD: Stage 3.  14. Mitral regurgitation: Moderate on 6/19 echo, suspect infarct-related.  15. AAA: 3.1 cm AAA on abdominal US 12/20  16. COVID-19 infection: 1/22.   Current Outpatient Medications  Medication Sig Dispense Refill  . amiodarone (PACERONE) 200 MG tablet Take 0.5 tablets (100 mg total) by mouth daily. 90 tablet 3  . aspirin EC 81 MG tablet Take 1 tablet (81 mg total) by mouth daily.    . carvedilol (COREG) 12.5 MG tablet Take 1 tablet (12.5 mg total) by mouth 2 (two) times daily with a meal. 90 tablet 3  . chlorhexidine (PERIDEX) 0.12 % solution Use as directed 15 mLs in the mouth or throat daily as needed (irritation).   3  . clopidogrel (PLAVIX) 75 MG tablet TAKE 1 TABLET (75 MG TOTAL) BY MOUTH DAILY WITH BREAKFAST. 90 tablet 2  . fluticasone (FLONASE) 50 MCG/ACT nasal spray Place 1-2 sprays into both nostrils daily as needed for allergies or rhinitis.    . furosemide (LASIX) 40 MG tablet Take 1 tablet (40 mg total) by mouth daily. 30 tablet 3  . leflunomide (ARAVA) 20 MG tablet Take 20 mg by mouth daily.    . Melatonin 10 MG TABS Take 10 mg by mouth at bedtime as needed (sleep).     . simvastatin  (ZOCOR) 20 MG tablet TAKE 1 TABLET BY MOUTH EVERYDAY AT BEDTIME 90 tablet 3  . valsartan (DIOVAN) 40 MG tablet Take 1 tablet (40 mg total) by mouth 2 (two) times daily. 60 tablet 3   No current facility-administered medications for this encounter.  Allergies:   Atorvastatin, Crestor [rosuvastatin calcium], Enalapril, Lisinopril, Losartan, and Telmisartan   Social History:  The patient  reports that he quit smoking about 13 years ago. He has a 47.00 pack-year smoking history. He has never used smokeless tobacco. He reports current alcohol use. He reports that he does not use drugs.   Family History:  The patient's family history includes Gastric cancer in his mother; Heart attack (age of onset: 29) in his brother; Peripheral vascular disease in his father; Throat cancer in his father.   ROS:  Please see the history of present illness.   All other systems are personally reviewed and negative.   Exam:  (Video/Tele Health Call; Exam is subjective and or/visual.) BP 124/71   Pulse 62   Wt 75.2 kg (165 lb 12.8 oz)   BMI 24.13 kg/m  General:  Speaks in full sentences. No resp difficulty. Neck: JVP not elevated Lungs: Normal respiratory effort with conversation.  Abdomen: Non-distended per patient report Extremities: No edema Neuro: Alert & oriented x 3.   Recent Labs: 01/28/2020: Hemoglobin 10.5; Hemoglobin 10.2; Platelets 287 03/15/2020: BUN 32; Creatinine, Ser 1.96; Magnesium 2.2; Potassium 4.6; Sodium 138  Personally reviewed   Wt Readings from Last 3 Encounters:  06/16/20 75.2 kg (165 lb 12.8 oz)  06/08/20 79.4 kg (175 lb)  06/02/20 74 kg (163 lb 3.2 oz)     ASSESSMENT AND PLAN:  1. Chronic systolic CHF: Ischemic cardiomyopathy.  Medtronic ICD.  Echo (1/21) with EF 40-45%, stable. NYHA class III symptoms.  Optivol suggests fluid excess but improving with resumption of Lasix (impedance trending up).   - Continue Lasix 40 mg daily.  - Continue valsartan 40 mg bid.  - Will  repeat BMET before restarting eplerenone and Jardiance, this will be done next Tuesday and will also get a device check that day.    - Continue Coreg 12.5 mg bid.   2. CAD: Denies chest pain.  Had DES x 3 to ramus in 3/18 and DES to OM1 in 9/21.   - Continue ASA 81 mg daily, clopidogrel, Zocor 20 mg daily.   3. Hyperlipidemia: Stable on simvastatin without myalgias.  Has not been able to tolerate higher dose of simvastatin or other statins. Unable to tolerate Zetia due to constipation.  Recent LDL was 77 which is ok.  He has refused in the past to start Repatha due to donut hole concerns.   4. Hx of VT: Has been on amiodarone 200 mg daily (increased from 100 mg daily after last VT event). Has ICD.  He did not tolerate sotalol.  - Subclinical hypothyroidism (has had mildly elevated TSH with normal free T3 and free T4, recently checked).   - Recent LFTs normal.  - Knows to get a yearly eye exam - See below regarding ?amiodarone lung toxicity.  5. PAD: He denies claudication.  Normal ABIs in 12/20, followed by [facility].   6. AAA: 3.1 cm in 12/20, followed by [facility].  7. CKD: Stage 3.  Recent AKI, creatinine down to 2.1. Suspect overdiuresed/low BP in setting of COVID-19 infection.  - Referred for nephrology evaluation.  8. Pulmonary: PFTs were done with increased dyspnea and amiodorone use, concerning for ILD/pulmonary fibrosis.  High resolution CT chest was concerning for ILD.  I have worried about amiodarone-induced lung toxicity. 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  He was offered antifibrotic treatment but wants to hold off for now.  - Continue amidorone 100  mg daily, LFTs and TSH stable recently.  He needs a regular eye exam.     followup 1 month with a virtual appt.   COVID screen The patient does not have any symptoms that suggest any further testing/ screening at this time.  Social distancing reinforced today.  Patient Risk: After full review  of this patients clinical status, I feel that they are at moderate risk for cardiac decompensation at this time.  Relevant cardiac medications were reviewed at length with the patient today. The patient does not have concerns regarding their medications at this time.   Today, I have spent 19 minutes with the patient with telehealth technology discussing the above issues .    Signed, Loralie Champagne, MD  06/18/2020  [facility] [address] [facility] [place] [address] [phone] (office) [phone] (fax)

## 2020-06-19 ENCOUNTER — Encounter (HOSPITAL_COMMUNITY): Payer: Self-pay

## 2020-07-12 ENCOUNTER — Other Ambulatory Visit (HOSPITAL_COMMUNITY): Payer: Self-pay | Admitting: Cardiology

## 2020-07-14 ENCOUNTER — Encounter (HOSPITAL_COMMUNITY): Payer: Self-pay

## 2020-07-14 ENCOUNTER — Other Ambulatory Visit: Payer: Self-pay

## 2020-07-14 ENCOUNTER — Encounter (HOSPITAL_COMMUNITY): Payer: Self-pay | Admitting: Cardiology

## 2020-07-14 ENCOUNTER — Telehealth (HOSPITAL_COMMUNITY): Payer: Self-pay | Admitting: Pharmacy Technician

## 2020-07-14 ENCOUNTER — Ambulatory Visit (HOSPITAL_COMMUNITY)
Admission: RE | Admit: 2020-07-14 | Discharge: 2020-07-14 | Disposition: A | Discharge status: Home / Self Care | Payer: Medicare Other | Source: Ambulatory Visit | Attending: Cardiology | Admitting: Cardiology

## 2020-07-14 VITALS — BP 106/75 | HR 60 | Temp 97.4°F | Wt 155.0 lb

## 2020-07-14 DIAGNOSIS — I5022 Chronic systolic (congestive) heart failure: Secondary | ICD-10-CM | POA: Diagnosis not present

## 2020-07-14 NOTE — Patient Instructions (Addendum)
No Labs done today.   No medication changes were made. Please continue all current medications as prescribed.  Standing orders for a Cmet,Free T3, Free T4,TSH & Lipids were Faxed to [facility] at [phone].  Your physician recommends that you schedule a follow-up appointment in: 3 months with an echo prior to your exam.  Your physician has requested that you have an echocardiogram. Echocardiography is a painless test that uses sound waves to create images of your heart. It provides your doctor with information about the size and shape of your heart and how well your heart's chambers and valves are working. This procedure takes approximately one hour. There are no restrictions for this procedure.   If you have any questions or concerns before your next appointment please send [place] a message through [place] or call our office at [phone].    TO LEAVE A MESSAGE FOR THE NURSE SELECT OPTION 2, PLEASE LEAVE A MESSAGE INCLUDING: . YOUR NAME . DATE OF BIRTH . CALL BACK NUMBER . REASON FOR CALL**this is important as we prioritize the call backs  YOU WILL RECEIVE A CALL BACK THE SAME DAY AS LONG AS YOU CALL BEFORE 4:00 PM   Do the following things EVERYDAY: 1) Weigh yourself in the morning before breakfast. Write it down and keep it in a log. 2) Take your medicines as prescribed 3) Eat low salt foods--Limit salt (sodium) to 2000 mg per day.  4) Stay as active as you can everyday 5) Limit all fluids for the day to less than 2 liters   At the [facility], you and your health needs are our priority. As part of our continuing mission to provide you with exceptional heart care, we have created designated Provider Care Teams. These Care Teams include your primary Cardiologist (physician) and Advanced Practice Providers (APPs- Physician Assistants and Nurse Practitioners) who all work together to provide you with the care you need, when you need it.   You may see any of  the following providers on your designated Care Team at your next follow up: [name] Dr Glori Bickers . Dr Loralie Champagne . Darrick Grinder, NP . Lyda Jester, PA . Audry Riles, PharmD   Please be sure to bring in all your medications bottles to every appointment.

## 2020-07-14 NOTE — Telephone Encounter (Signed)
Patient was seen via televisit today and may potentially start Wilder Glade if affordable. He was unable to tolerate Jardiance in the past, due to upset stomach. Current 30 day co-pay of Wilder Glade is $40 and 90 days is $120. Spoke with the patient and will contact insurance on Monday to start a tier exception.  Based off of the tier exception outcome will be how the patient decides to proceed with whether he will take [place].  Will call him once I have a determination.

## 2020-07-16 NOTE — Progress Notes (Signed)
Heart Failure TeleHealth Note  Due to national recommendations of social distancing due to [place] 19, Audio/video telehealth visit is felt to be most appropriate for this patient at this time.  See MyChart message from today for patient consent regarding telehealth for [facility].  Date: 07/16/2020  ID:  Nathan Pruitt, DOB [birth year], MRN [id number]  Location: Home  Provider location: [facility] Type of Visit: Established patient   PCP:  Ronita Hipps, MD  Cardiologist:  Dr. Aundra Dubin   History of Present Illness: Nathan Pruitt is a 75 y.o. male presents via audio/video conferencing for a telehealth visit today.     he denies symptoms worrisome for COVID 19.   Nathan Pruitt is a 75 y.o.with a history of CAD, PAD, ischemic CMP, and VT s/p ICD.  Echo in 2/17 showed EF 40% and regional WMAs.  In 3/18, he developed episodes of VT as well as increased dyspnea.  LHC/RHC was done, showing new occlusion of a large ramus (CTO, probably a month or so old at time of cath) and old CTO RCA with collaterals.  He had DES x 3 to ramus.  Filling pressures were optimized on RHC.  Amiodarone was increased to 200 mg daily from 100 mg daily.  No VT since PCI.   Echo in 6/19 showed EF 35-40% with wall motion abnormalities. Echo in 1/21 showed EF 40-45% with basal to mid inferolateral and inferior akinesis, mild MR, normal RV, PASP 35 mmHg.   LHC/RHC in 9/21 with normal filling pressures and cardiac output but 95% OM1 stenosis.  This was treated with DES to OM1.   He has been following with Dr. Vaughan Browner with idiopathic pulmonary fibrosis.  I was concerned that he could have amiodarone -related lung toxicity but Dr. Vaughan Browner did not think this was very likely.  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    Labs drawn in 1/22 showed K up to 5.9 and creatinine 4.  He was sent to the ER at [facility].  He was found to have COVID-19 and was  admitted. He had a productive cough and sore throat.  He was thought to be dehydrated and got IV fluid.  Creatinine 2.3 at discharge.  Lasix, valsartan, and eplerenone were stopped.  Other meds were continued.   Valsartan 40 mg bid and Lasix 40 mg daily have now been restarted.  Creatinine in 2/22 was stable at 2.22.  No dyspnea walking around the house but not very active, he gets short of breath walking up a hill or incline.  Weight stable.  No chest pain.  No orthopnea/PND. Stamina poor. Mild lightheadedness if he stands up too fast.      Labs (11/10): K 4.9, creatinine 1.4, LDL 70, HDL 31, LFTs normal Labs (3/11): K 4.4, creatinine 1.3, LDL 61, HDL 35 Labs (7/11): LDL 62, HDL 23 Labs (1/12): K 4.8, LFTs normal, creatinine 1.44, LDL 69, HDL 33 Labs (7/12): LFTs normal, LDL 81, HDL 36, HCT normal, K 4.3, creatinine 1.3, BNP 116, TSH normal Labs (1/13): LDL 39, HDl 31, LFTs normal, TSH  Labs (7/13): TSH normal, LFTs normal, LDL 69, HDL 40 Labs (8/13): K 4.5, creatinine 1.6 Labs (10/13): K 4.4, creatinine 1.4 Labs (3/14): LDL 108, HDL 30, LFTs normal Labs (9/14): K 4.2, creatinine 1.6, LFTs normal, LDL 114, TSH 10.6 (elevated), free T4/free T3 normal Labs (2/15): K 4.6, creatinine 1.5, LFTs normal, TSH 7.6 (elevated), free T4  and free T3 normal Labs (3/15): K 3.6, creatinine 1.5, LFTs normal, TSH normal Labs (9/15): K 4.3, creatinine 1.6, LFTs normal, TSH normal, LDL 115, HDL 24 Labs (10/16): K 4.5, creatinine 1.5, LDL 90, HDL 35, hgb 12.8 Labs (11/16): Low free T3, normal TSH and free T4 Labs (12/16): K 5, creatinine 1.8, LFTs normal, TSH 8.6 (mildly elevated) Labs (1/17): Free T4/TSH/free T3 normal, LFTs normal Labs (2/17): K 4.4, creatinine 2.4, LDL 131, HDL 34 Labs (4/17): K 4.2, creatinine 1.3, LDL 67, HDL 35 Labs (9/17): K 4.9, creatinine 2.2, LDL 54, HDL 35, free T3 and T4 normal, hgb 12 Labs (4/18): K 5, creatinine 1.78, LFTs normal, TSH elevated but free T3 and free T4 normal.   Labs (6/18): LDL 52, HDL 35, TSH elevated 6.8, LFTs normal Labs (7/18): K 4.3, creatinine 1.68 Labs (10/18): TSH 5.6, free T3 normal, free T4 normal, LFTs normal, K 4.9, creatinine 1.4 Labs (11/18): K 4.5, creatinine 1.46, LFTs normal Labs (4/19): K 4.8, creatinine 1.6, LFTs normal Labs (6/19): TSH slightly elevated, free T3/T4 both normal, LFTs normal Labs (7/19): K 4.3, creatinine 1.3 Labs (10/19): LFTs normal, LDL 58, TGs 165, TSH 6.6 but free T4 and free T3 normal Labs (1/20): K 4.7, creatinine 1.7 Labs (2/20): LFTs normal Labs (4/20): K 4.6, creatinine 1.4, LDL 77, HDL 36, hgb 12.2, TSH mild elevation 7.4, free T4 and free T3 normal.  Labs (7/20): K 3.8, creatinine 1.6, hgb 12.2, LDL 81, TGs 158, HDL 35, TSH 7.7 (mildly elevated), LFTs normal Labs (11/20): K 4.5, creatinine 1.7, NT-proBNP 1358, TSH 5.6 (mild elevation), LDL 88, LFTs normal Labs (4/21): K 4.3, creatinine 1.6, hgb 12.4, LFTs, normal, TGs 105, LDL 81, HDL 40, TSH 5.6 (mildly elevated), free T3 and free T4 normal Labs (6/21): K 4.7, creatinine 1.9, AST/ALT normal, TSH mildly elevated at 7.9, free T3 and T4 normal, LDL 87, TGs 118 Labs (10/21): K 4.4, creatinine 1.8, LFTs normal, LDL 89, pro-BNP 1830, LFTs normal Labs (11/21): ESR 48, K 4.6, creatinine 1.96 Labs (12/21): ANA negative Labs (1/22): K 5.9, creatinine 4 => 2.3 Labs (2/22): K 4.6, creatinine 2.1 => 2.2, LDL 77, LFTs normal  Allergies (verified):  No Known Drug Allergies  Past Medical History: 1. Coronary artery disease. The patient reports history of silent MI in 1993.  This was likely an inferior MI (see below).  - The patient presented to [facility] in [facility]10 with VT and mildly elevated cardiac enzymes. LHC (5/10):  Inferobasal dyskinesis with EF 35-40%.  There was chronic total occlusion of the mid RCA with good collaterals.  Luminals LCA.  This did not appear to be an acute cause of the 5/10 event.  - LHC (3/18): Known occlusion of RCA with collaterals,  new occlusion of ramus.  DES x 3 to ramus.  - LHC (9/21): Known occlusion RCA with collaterals, 95% OMI treated with DES.  2. Hypertension.  3. Hyperlipidemia: Intolerant of higher doses of simvastatin, Crestor, Lipitor, pravastatin. Zetia caused constipation.  4. Remote tobacco abuse with 47-pack-year history, quitting in August 2009.  5. Peripheral arterial disease.  - Status post left-to-right fem-fem bypass performed in [place] in March 2009.  - Status post redo left-to-right fem-fem bypass performed by Dr. Scot Dock at [facility] in 2009.  - ABIs normal 3/15, ABIs normal 3/16, ABIs normal 3/17, ABIs normal 4/18, ABIs normal 4/19, ABIs normal 12/20.  6. Rheumatoid arthritis, on leflunomide.  7.  Ischemic cardiomyopathy:  EF 35-40% by LV-gram 5/10 with inferobasal  dyskinesis.  Echo 5/10 showed EF 40% with mild LVH, no significant MR, inferobasal and posterobasal akinesis.  Echo (7/11): EF 50%, mild LVH, basal-mid inferoposterior akinesis.  Echo (7/12): EF 45-50% with basal anterolateral, basal posterior, and basal to mid inferior akinesis.  Echo (4/16): EF 40-45%, basal to mid inferolateral AK, basal inferior AK, basal to mid anterolateral HK.  Echo (2/17): EF 40% with basal to mid inferior akinesis, basal inferolateral aneurysm, mid inferolateral akinesis, basal anterolateral hypokinesis, normal RV size and systolic function, PASP 25 mmHg.  - Echo (3/18) with EF 30-35%, moderate LV dilation, moderate MR, mildly decreased RV systolic function, PASP 51 mmmHg. - RHC (3/18): mean RA 4, PA 38/12, mean PCWP 17, CI 2.2.  - Echo (6/19): EF 35-40%, inferior/inferolateral/anterolateral WMAs, moderate MR likely infarct-related, normal RV size with mildly decreased systolic function.  - Echo (1/21): EF 35-40% with wall motion abnormalities. Echo was done today and reviewed, EF 40-45% with basal to mid inferolateral and inferior akinesis, mild MR, normal RV, PASP 35 mmHg.  - RHC (9/21): mean RA 4, PA 33/12,  mean PCWP 9, CI 3.12 8.  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  He has a dual chamber Medtronic ICD.  9.  Atrial flutter:  Status post isthmus ablation 5/10.   10. Restrictive lung defect: PFTs (7/11): FVC 74%, FEV1 80%, ratio 75%, TLC 78%, DLCO 68%.  This suggests a mild restrictive defect and a mild obstructive defect.  He did have response to bronchodilator. These PFTs were significantly better than the report from Dr. Alcide Clever in [place] done prior.  He had last PFTs in 2/12 with no significant change.  - PFTs (9/21) with significantly decreased DLCO and concern for ILD/pulmonary fibrosis.  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  11.  Anxiety 12.  Chronic cough: No relief with change from ACEI to ARB or with trial of PPI.  13.  CKD: Stage 3.  14. Mitral regurgitation: Moderate on 6/19 echo, suspect infarct-related.  15. AAA: 3.1 cm AAA on abdominal US 12/20  16. COVID-19 infection: 1/22.   Current Outpatient Medications  Medication Sig Dispense Refill  . amiodarone (PACERONE) 200 MG tablet Take 0.5 tablets (100 mg total) by mouth daily. 90 tablet 3  . aspirin EC 81 MG tablet Take 1 tablet (81 mg total) by mouth daily.    . carvedilol (COREG) 12.5 MG tablet Take 1 tablet (12.5 mg total) by mouth 2 (two) times daily with a meal. 90 tablet 3  . chlorhexidine (PERIDEX) 0.12 % solution Use as directed 15 mLs in the mouth or throat daily as needed (irritation).   3  . clopidogrel (PLAVIX) 75 MG tablet TAKE 1 TABLET (75 MG TOTAL) BY MOUTH DAILY WITH BREAKFAST. 90 tablet 2  . fluticasone (FLONASE) 50 MCG/ACT nasal spray Place 1-2 sprays into both nostrils daily as needed for allergies or rhinitis.    . furosemide (LASIX) 40 MG tablet Take 1 tablet (40 mg total) by mouth daily. 30 tablet 3  . leflunomide (ARAVA) 20 MG tablet Take 20 mg by mouth daily.    . Melatonin 10 MG TABS Take 10 mg by mouth at bedtime as  needed (sleep).     . simvastatin (ZOCOR) 20 MG tablet TAKE 1 TABLET BY MOUTH EVERYDAY AT BEDTIME 90 tablet 3  . valsartan (DIOVAN) 40 MG tablet Take 1 tablet (40 mg total) by mouth 2 (two) times daily. 60 tablet 3  No current facility-administered medications for this encounter.    Allergies:   Atorvastatin, Crestor [rosuvastatin calcium], Enalapril, Lisinopril, Losartan, and Telmisartan   Social History:  The patient  reports that he quit smoking about 13 years ago. He has a 47.00 pack-year smoking history. He has never used smokeless tobacco. He reports current alcohol use. He reports that he does not use drugs.   Family History:  The patient's family history includes Gastric cancer in his mother; Heart attack (age of onset: 43) in his brother; Peripheral vascular disease in his father; Throat cancer in his father.   ROS:  Please see the history of present illness.   All other systems are personally reviewed and negative.   Exam:  (Video/Tele Health Call; Exam is subjective and or/visual.) BP 106/75   Pulse 60   Temp (!) 97.4 F (36.3 C)   Wt 70.3 kg (155 lb)   BMI 22.56 kg/m  General:  Speaks in full sentences. No resp difficulty. Neck: JVP not elevated Lungs: Normal respiratory effort with conversation.  Abdomen: Non-distended per patient report Extremities: No edema Neuro: Alert & oriented x 3.   Recent Labs: 01/28/2020: Hemoglobin 10.5; Hemoglobin 10.2; Platelets 287 03/15/2020: BUN 32; Creatinine, Ser 1.96; Magnesium 2.2; Potassium 4.6; Sodium 138  Personally reviewed   Wt Readings from Last 3 Encounters:  07/14/20 70.3 kg (155 lb)  06/16/20 75.2 kg (165 lb 12.8 oz)  06/08/20 79.4 kg (175 lb)     ASSESSMENT AND PLAN:  1. Chronic systolic CHF: Ischemic cardiomyopathy.  Medtronic ICD.  Echo (1/21) with EF 40-45%, stable. NYHA class III symptoms, stable.  Weight has been stable at home.  - Continue Lasix 40 mg daily. BMET today.  - Continue valsartan 40 mg bid.      - Continue Coreg 12.5 mg bid.   - With creatinine remaining elevated at 2.2 on last check, I have not started him back on eplerenone yet.   - He did not tolerate Jardiance.  Wilder Glade is too expensive for him.  - I will arrange for echo at next appt. - We discussed getting more exercise.  2. CAD: Denies chest pain.  Had DES x 3 to ramus in 3/18 and DES to OM1 in 9/21.   - Continue ASA 81 mg daily, clopidogrel, Zocor 20 mg daily.   3. Hyperlipidemia: Stable on simvastatin without myalgias.  Has not been able to tolerate higher dose of simvastatin or other statins. Unable to tolerate Zetia due to constipation.  Recent LDL was 77 which is ok.  He has refused in the past to start Repatha due to donut hole concerns.   4. Hx of VT: Has been on amiodarone 100 mg daily. Has ICD.  He did not tolerate sotalol.  - Subclinical hypothyroidism (has had mildly elevated TSH with normal free T3 and free T4, check again today).   - Check LFTs today.   - Knows to get a yearly eye exam - See below regarding ?amiodarone lung toxicity.  5. PAD: He denies claudication.  Normal ABIs in 12/20, followed by [facility].   6. AAA: 3.1 cm in 12/20, followed by [facility].  7. CKD: Stage 3.  Last creatinine 2.2. - BMET to be arranged.  - Was referred to nephrology.  8. Pulmonary: PFTs were done with increased dyspnea and amiodorone use, concerning for ILD/pulmonary fibrosis.  High resolution CT chest was concerning for ILD.  I have worried about amiodarone-induced lung toxicity. 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  He was offered antifibrotic treatment but wants to hold off for now.  - Continue amidorone 100 mg daily as above.     followup 3 months in office with echo.   COVID screen The patient does not have any symptoms that suggest any further testing/ screening at this time.  Social distancing reinforced today.  Patient Risk: After full review of this patients clinical status,  I feel that they are at moderate risk for cardiac decompensation at this time.  Relevant cardiac medications were reviewed at length with the patient today. The patient does not have concerns regarding their medications at this time.   Today, I have spent 18 minutes with the patient with telehealth technology discussing the above issues .    Signed, Loralie Champagne, MD  07/16/2020  [facility] [address] [facility] [place] [address] [phone] (office) [phone] (fax)

## 2020-07-17 ENCOUNTER — Encounter (HOSPITAL_COMMUNITY): Payer: Self-pay

## 2020-07-17 ENCOUNTER — Telehealth (HOSPITAL_COMMUNITY): Payer: Self-pay | Admitting: Cardiology

## 2020-07-17 NOTE — Telephone Encounter (Signed)
Patient will be contacted once provider has reviewed results.

## 2020-07-17 NOTE — Telephone Encounter (Signed)
Pt stated he is not satisfied with lab results, requesting to speak with nurse or Dr, please advise

## 2020-07-18 ENCOUNTER — Encounter (HOSPITAL_COMMUNITY): Payer: Self-pay

## 2020-07-18 NOTE — Telephone Encounter (Signed)
Called [facility] Medicare advantage and submitted tier exception for Farxiga over the phone, [phone]. Representative stated that the standard turn around time for this request is 3-14 business days.  Reference Number [id number].  Will follow up.

## 2020-07-19 ENCOUNTER — Telehealth (HOSPITAL_COMMUNITY): Payer: Self-pay | Admitting: *Deleted

## 2020-07-19 ENCOUNTER — Other Ambulatory Visit (HOSPITAL_COMMUNITY): Payer: Self-pay | Admitting: Cardiology

## 2020-07-19 NOTE — Telephone Encounter (Signed)
Called pt to f/u regarding recent labs and med changes. Pt very concerned about Cr being up to 3.3 as it is gradually increasing. He is aware to hold Lasix for 3 days and will get recheck labs on Mon 3/21. We also had a lengthy conversation on fluid intake and importance of getting some fluids in without going over board. He is agreeable and verbalizes understanding.

## 2020-07-24 ENCOUNTER — Encounter (HOSPITAL_COMMUNITY): Payer: Self-pay

## 2020-07-25 NOTE — Telephone Encounter (Signed)
Sent in tier exception appeal via fax to [phone].  Will follow up.

## 2020-08-15 ENCOUNTER — Other Ambulatory Visit (HOSPITAL_COMMUNITY): Payer: Self-pay

## 2020-08-15 ENCOUNTER — Ambulatory Visit (INDEPENDENT_AMBULATORY_CARE_PROVIDER_SITE_OTHER): Payer: Medicare Other

## 2020-08-15 ENCOUNTER — Telehealth: Payer: Self-pay

## 2020-08-15 ENCOUNTER — Encounter (HOSPITAL_COMMUNITY): Payer: Self-pay

## 2020-08-15 DIAGNOSIS — I255 Ischemic cardiomyopathy: Secondary | ICD-10-CM | POA: Diagnosis not present

## 2020-08-15 LAB — CUP PACEART REMOTE DEVICE CHECK
Battery Remaining Longevity: 83 mo
Battery Voltage: 2.98 V
Brady Statistic AP VP Percent: 5.42 %
Brady Statistic AP VS Percent: 1.34 %
Brady Statistic AS VP Percent: 44.18 %
Brady Statistic AS VS Percent: 49.06 %
Brady Statistic RA Percent Paced: 6.66 %
Brady Statistic RV Percent Paced: 49.07 %
Date Time Interrogation Session: 20220412021245
HighPow Impedance: 41 Ohm
HighPow Impedance: 53 Ohm
Implantable Lead Implant Date: 20100520
Implantable Lead Implant Date: 20100520
Implantable Lead Location: 753859
Implantable Lead Location: 753860
Implantable Lead Model: 5076
Implantable Lead Model: 6947
Implantable Pulse Generator Implant Date: 20180710
Lead Channel Impedance Value: 323 Ohm
Lead Channel Impedance Value: 380 Ohm
Lead Channel Impedance Value: 456 Ohm
Lead Channel Pacing Threshold Amplitude: 0.5 V
Lead Channel Pacing Threshold Amplitude: 1.125 V
Lead Channel Pacing Threshold Pulse Width: 0.4 ms
Lead Channel Pacing Threshold Pulse Width: 0.4 ms
Lead Channel Sensing Intrinsic Amplitude: 14.375 mV
Lead Channel Sensing Intrinsic Amplitude: 14.375 mV
Lead Channel Sensing Intrinsic Amplitude: 2.25 mV
Lead Channel Sensing Intrinsic Amplitude: 2.25 mV
Lead Channel Setting Pacing Amplitude: 1.5 V
Lead Channel Setting Pacing Amplitude: 2.5 V
Lead Channel Setting Pacing Pulse Width: 0.4 ms
Lead Channel Setting Sensing Sensitivity: 0.3 mV

## 2020-08-15 NOTE — Telephone Encounter (Signed)
Error

## 2020-08-15 NOTE — Telephone Encounter (Signed)
Called [phone] and there is no update on the patient's tier exception at this time.

## 2020-08-15 NOTE — Telephone Encounter (Signed)
Patient called in and would like a call back for someone to go over recent transmission

## 2020-08-15 NOTE — Telephone Encounter (Signed)
Creatinine is down to 2.7.  There have been a lot of back and forth emails and calls, I am unsure exactly what his current med regimen is.  Would be most helpful to me to have him come up for appt later this week (work in) so I can see and examine him, get REDS clip, go over his meds, get labs, etc.

## 2020-08-15 NOTE — Telephone Encounter (Signed)
Patient called and reviewed 08/15/20 transmission with the patient. Findings of concern were 2 short NSVT episodes and elevated Optivol. Findings explained to patient. Patient concerned because no call received about findings. Education done on when calls are made concerning remote transmissions. Patient voiced concerns about recent hospitalization and change in kidney function. Concerned about discharge instructions to drink more fluids and Dr Claris Gladden instructions to limit fluid intake. Advised to follow-up with PCP concerning hospitalization and renal function. Reassured patient that concerns will be relayed to cardiologist.

## 2020-08-16 ENCOUNTER — Encounter (HOSPITAL_COMMUNITY): Payer: Self-pay | Admitting: Cardiology

## 2020-08-16 ENCOUNTER — Ambulatory Visit (HOSPITAL_COMMUNITY)
Admission: RE | Admit: 2020-08-16 | Discharge: 2020-08-16 | Disposition: A | Discharge status: Home / Self Care | Payer: Medicare Other | Source: Ambulatory Visit | Attending: Cardiology | Admitting: Cardiology

## 2020-08-16 ENCOUNTER — Other Ambulatory Visit: Payer: Self-pay

## 2020-08-16 ENCOUNTER — Encounter (HOSPITAL_COMMUNITY): Payer: Self-pay | Admitting: *Deleted

## 2020-08-16 VITALS — BP 88/64 | HR 65 | Wt 149.4 lb

## 2020-08-16 DIAGNOSIS — E785 Hyperlipidemia, unspecified: Secondary | ICD-10-CM | POA: Diagnosis not present

## 2020-08-16 DIAGNOSIS — M069 Rheumatoid arthritis, unspecified: Secondary | ICD-10-CM | POA: Insufficient documentation

## 2020-08-16 DIAGNOSIS — J84112 Idiopathic pulmonary fibrosis: Secondary | ICD-10-CM | POA: Diagnosis not present

## 2020-08-16 DIAGNOSIS — I739 Peripheral vascular disease, unspecified: Secondary | ICD-10-CM | POA: Insufficient documentation

## 2020-08-16 DIAGNOSIS — Z79899 Other long term (current) drug therapy: Secondary | ICD-10-CM | POA: Insufficient documentation

## 2020-08-16 DIAGNOSIS — I130 Hypertensive heart and chronic kidney disease with heart failure and stage 1 through stage 4 chronic kidney disease, or unspecified chronic kidney disease: Secondary | ICD-10-CM | POA: Insufficient documentation

## 2020-08-16 DIAGNOSIS — Z7902 Long term (current) use of antithrombotics/antiplatelets: Secondary | ICD-10-CM | POA: Insufficient documentation

## 2020-08-16 DIAGNOSIS — Z8249 Family history of ischemic heart disease and other diseases of the circulatory system: Secondary | ICD-10-CM | POA: Diagnosis not present

## 2020-08-16 DIAGNOSIS — I252 Old myocardial infarction: Secondary | ICD-10-CM | POA: Insufficient documentation

## 2020-08-16 DIAGNOSIS — Z87891 Personal history of nicotine dependence: Secondary | ICD-10-CM | POA: Insufficient documentation

## 2020-08-16 DIAGNOSIS — N183 Chronic kidney disease, stage 3 unspecified: Secondary | ICD-10-CM | POA: Insufficient documentation

## 2020-08-16 DIAGNOSIS — Z95810 Presence of automatic (implantable) cardiac defibrillator: Secondary | ICD-10-CM | POA: Diagnosis not present

## 2020-08-16 DIAGNOSIS — Z7982 Long term (current) use of aspirin: Secondary | ICD-10-CM | POA: Insufficient documentation

## 2020-08-16 DIAGNOSIS — E038 Other specified hypothyroidism: Secondary | ICD-10-CM | POA: Diagnosis not present

## 2020-08-16 DIAGNOSIS — I714 Abdominal aortic aneurysm, without rupture: Secondary | ICD-10-CM | POA: Insufficient documentation

## 2020-08-16 DIAGNOSIS — I5022 Chronic systolic (congestive) heart failure: Secondary | ICD-10-CM | POA: Diagnosis not present

## 2020-08-16 DIAGNOSIS — I255 Ischemic cardiomyopathy: Secondary | ICD-10-CM | POA: Diagnosis not present

## 2020-08-16 DIAGNOSIS — Z8616 Personal history of COVID-19: Secondary | ICD-10-CM | POA: Diagnosis not present

## 2020-08-16 DIAGNOSIS — Z955 Presence of coronary angioplasty implant and graft: Secondary | ICD-10-CM | POA: Insufficient documentation

## 2020-08-16 DIAGNOSIS — I2510 Atherosclerotic heart disease of native coronary artery without angina pectoris: Secondary | ICD-10-CM | POA: Diagnosis not present

## 2020-08-16 MED ORDER — FUROSEMIDE 40 MG PO TABS
ORAL_TABLET | ORAL | 3 refills | Status: DC
Start: 2020-08-16 — End: 2020-10-18

## 2020-08-16 NOTE — Progress Notes (Signed)
Date: 08/16/2020  ID:  Nathan Pruitt, DOB 1945-10-24, MRN [id number]   Provider location: [facility] Type of Visit: Established patient   PCP:  Ronita Hipps, MD  Cardiologist:  Dr. Aundra Dubin   History of Present Illness: Nathan Pruitt is a 75 y.o.with a history of CAD, PAD, ischemic CMP, and VT s/p ICD.  Echo in 2/17 showed EF 40% and regional WMAs.  In 3/18, he developed episodes of VT as well as increased dyspnea.  LHC/RHC was done, showing new occlusion of a large ramus (CTO, probably a month or so old at time of cath) and old CTO RCA with collaterals.  He had DES x 3 to ramus.  Filling pressures were optimized on RHC.  Amiodarone was increased to 200 mg daily from 100 mg daily.  No VT since PCI.   Echo in 6/19 showed EF 35-40% with wall motion abnormalities. Echo in 1/21 showed EF 40-45% with basal to mid inferolateral and inferior akinesis, mild MR, normal RV, PASP 35 mmHg.   LHC/RHC in 9/21 with normal filling pressures and cardiac output but 95% OM1 stenosis.  This was treated with DES to OM1.   He has been following with Dr. Vaughan Browner with idiopathic pulmonary fibrosis.  I was concerned that he could have amiodarone -related lung toxicity but Dr. Vaughan Browner did not think this was very likely.  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    Labs drawn in 1/22 showed K up to 5.9 and creatinine 4.  He was sent to the ER at [facility].  He was found to have COVID-19 and was admitted. He had a productive cough and sore throat.  He was thought to be dehydrated and got IV fluid.  Creatinine 2.3 at discharge.  Lasix, valsartan, and eplerenone were stopped.  Other meds were continued.   He returns for followup of CHF.  Creatinine has been steadily worsening recently, creatinine 2.7 on 08/15/20.  SBP around 100 at home when he checks (not often) and BP quite low today at 88/64.  He get lightheaded with standing at times.  He fatigues  easily, had to use a motorized cart around [place] recently.  He gets tired walking from his house to his car.  Not very active. No chest pain.  No orthopnea/PND.   MDT device interrogation: Optivol fluid index < threshold (had been > threshold until recently).  No AF/VT.  36% RV pacing noted.  However, he is not currently RV pacing.   ECG (personally reviewed): NSR, 1st degree AVB, IVCD  REDS clip 28%  Labs (11/10): K 4.9, creatinine 1.4, LDL 70, HDL 31, LFTs normal Labs (3/11): K 4.4, creatinine 1.3, LDL 61, HDL 35 Labs (7/11): LDL 62, HDL 23 Labs (1/12): K 4.8, LFTs normal, creatinine 1.44, LDL 69, HDL 33 Labs (7/12): LFTs normal, LDL 81, HDL 36, HCT normal, K 4.3, creatinine 1.3, BNP 116, TSH normal Labs (1/13): LDL 39, HDl 31, LFTs normal, TSH  Labs (7/13): TSH normal, LFTs normal, LDL 69, HDL 40 Labs (8/13): K 4.5, creatinine 1.6 Labs (10/13): K 4.4, creatinine 1.4 Labs (3/14): LDL 108, HDL 30, LFTs normal Labs (9/14): K 4.2, creatinine 1.6, LFTs normal, LDL 114, TSH 10.6 (elevated), free T4/free T3 normal Labs (2/15): K 4.6, creatinine 1.5, LFTs normal, TSH 7.6 (elevated), free T4 and free T3 normal Labs (3/15): K 3.6, creatinine 1.5, LFTs normal, TSH normal Labs (9/15): K 4.3, creatinine 1.6,  LFTs normal, TSH normal, LDL 115, HDL 24 Labs (10/16): K 4.5, creatinine 1.5, LDL 90, HDL 35, hgb 12.8 Labs (11/16): Low free T3, normal TSH and free T4 Labs (12/16): K 5, creatinine 1.8, LFTs normal, TSH 8.6 (mildly elevated) Labs (1/17): Free T4/TSH/free T3 normal, LFTs normal Labs (2/17): K 4.4, creatinine 2.4, LDL 131, HDL 34 Labs (4/17): K 4.2, creatinine 1.3, LDL 67, HDL 35 Labs (9/17): K 4.9, creatinine 2.2, LDL 54, HDL 35, free T3 and T4 normal, hgb 12 Labs (4/18): K 5, creatinine 1.78, LFTs normal, TSH elevated but free T3 and free T4 normal.  Labs (6/18): LDL 52, HDL 35, TSH elevated 6.8, LFTs normal Labs (7/18): K 4.3, creatinine 1.68 Labs (10/18): TSH 5.6, free T3 normal,  free T4 normal, LFTs normal, K 4.9, creatinine 1.4 Labs (11/18): K 4.5, creatinine 1.46, LFTs normal Labs (4/19): K 4.8, creatinine 1.6, LFTs normal Labs (6/19): TSH slightly elevated, free T3/T4 both normal, LFTs normal Labs (7/19): K 4.3, creatinine 1.3 Labs (10/19): LFTs normal, LDL 58, TGs 165, TSH 6.6 but free T4 and free T3 normal Labs (1/20): K 4.7, creatinine 1.7 Labs (2/20): LFTs normal Labs (4/20): K 4.6, creatinine 1.4, LDL 77, HDL 36, hgb 12.2, TSH mild elevation 7.4, free T4 and free T3 normal.  Labs (7/20): K 3.8, creatinine 1.6, hgb 12.2, LDL 81, TGs 158, HDL 35, TSH 7.7 (mildly elevated), LFTs normal Labs (11/20): K 4.5, creatinine 1.7, NT-proBNP 1358, TSH 5.6 (mild elevation), LDL 88, LFTs normal Labs (4/21): K 4.3, creatinine 1.6, hgb 12.4, LFTs, normal, TGs 105, LDL 81, HDL 40, TSH 5.6 (mildly elevated), free T3 and free T4 normal Labs (6/21): K 4.7, creatinine 1.9, AST/ALT normal, TSH mildly elevated at 7.9, free T3 and T4 normal, LDL 87, TGs 118 Labs (10/21): K 4.4, creatinine 1.8, LFTs normal, LDL 89, pro-BNP 1830, LFTs normal Labs (11/21): ESR 48, K 4.6, creatinine 1.96 Labs (12/21): ANA negative Labs (1/22): K 5.9, creatinine 4 => 2.3 Labs (2/22): K 4.6, creatinine 2.1 => 2.2, LDL 77, LFTs normal Labs (4/22): K 4.7,creatinine 2.7, TSH mildly elevated but free T3 and free T4 normal, hgb 10.2, LDL 67, LFTs normal  Allergies (verified):  No Known Drug Allergies  Past Medical History: 1. Coronary artery disease. The patient reports history of silent MI in 1993.  This was likely an inferior MI (see below).  - The patient presented to [facility] in 5/10 with VT and mildly elevated cardiac enzymes. LHC (5/10):  Inferobasal dyskinesis with EF 35-40%.  There was chronic total occlusion of the mid RCA with good collaterals.  Luminals LCA.  This did not appear to be an acute cause of the 5/10 event.  - LHC (3/18): Known occlusion of RCA with collaterals, new occlusion of  ramus.  DES x 3 to ramus.  - LHC (9/21): Known occlusion RCA with collaterals, 95% OMI treated with DES.  2. Hypertension.  3. Hyperlipidemia: Intolerant of higher doses of simvastatin, Crestor, Lipitor, pravastatin. Zetia caused constipation.  4. Remote tobacco abuse with 47-pack-year history, quitting in August 2009.  5. Peripheral arterial disease.  - Status post left-to-right fem-fem bypass performed in [place] in March 2009.  - Status post redo left-to-right fem-fem bypass performed by Dr. Scot Dock at [facility] in 2009.  - ABIs normal 3/15, ABIs normal 3/16, ABIs normal 3/17, ABIs normal 4/18, ABIs normal 4/19, ABIs normal 12/20.  6. Rheumatoid arthritis, on leflunomide.  7.  Ischemic cardiomyopathy:  EF 35-40% by LV-gram 5/10 with  inferobasal dyskinesis.  Echo 5/10 showed EF 40% with mild LVH, no significant MR, inferobasal and posterobasal akinesis.  Echo (7/11): EF 50%, mild LVH, basal-mid inferoposterior akinesis.  Echo (7/12): EF 45-50% with basal anterolateral, basal posterior, and basal to mid inferior akinesis.  Echo (4/16): EF 40-45%, basal to mid inferolateral AK, basal inferior AK, basal to mid anterolateral HK.  Echo (2/17): EF 40% with basal to mid inferior akinesis, basal inferolateral aneurysm, mid inferolateral akinesis, basal anterolateral hypokinesis, normal RV size and systolic function, PASP 25 mmHg.  - Echo (3/18) with EF 30-35%, moderate LV dilation, moderate MR, mildly decreased RV systolic function, PASP 51 mmmHg. - RHC (3/18): mean RA 4, PA 38/12, mean PCWP 17, CI 2.2.  - Echo (6/19): EF 35-40%, inferior/inferolateral/anterolateral WMAs, moderate MR likely infarct-related, normal RV size with mildly decreased systolic function.  - Echo (1/21): EF 35-40% with wall motion abnormalities. Echo was done today and reviewed, EF 40-45% with basal to mid inferolateral and inferior akinesis, mild MR, normal RV, PASP 35 mmHg.  - RHC (9/21): mean RA 4, PA 33/12, mean PCWP 9, CI  3.12 8.  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  He has a dual chamber Medtronic ICD.  9.  Atrial flutter:  Status post isthmus ablation 5/10.   10. Restrictive lung defect: PFTs (7/11): FVC 74%, FEV1 80%, ratio 75%, TLC 78%, DLCO 68%.  This suggests a mild restrictive defect and a mild obstructive defect.  He did have response to bronchodilator. These PFTs were significantly better than the report from Dr. Alcide Clever in [place] done prior.  He had last PFTs in 2/12 with no significant change.  - PFTs (9/21) with significantly decreased DLCO and concern for ILD/pulmonary fibrosis.  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  11.  Anxiety 12.  Chronic cough: No relief with change from ACEI to ARB or with trial of PPI.  13.  CKD: Stage 3.  14. Mitral regurgitation: Moderate on 6/19 echo, suspect infarct-related.  15. AAA: 3.1 cm AAA on abdominal US 12/20  16. COVID-19 infection: 1/22.   Current Outpatient Medications  Medication Sig Dispense Refill  . amiodarone (PACERONE) 200 MG tablet Take 0.5 tablets (100 mg total) by mouth daily. 90 tablet 3  . aspirin EC 81 MG tablet Take 1 tablet (81 mg total) by mouth daily.    . carvedilol (COREG) 12.5 MG tablet Take 1 tablet (12.5 mg total) by mouth 2 (two) times daily with a meal. 90 tablet 3  . chlorhexidine (PERIDEX) 0.12 % solution Use as directed 15 mLs in the mouth or throat daily as needed (irritation).   3  . clopidogrel (PLAVIX) 75 MG tablet TAKE 1 TABLET (75 MG TOTAL) BY MOUTH DAILY WITH BREAKFAST. 90 tablet 2  . fluticasone (FLONASE) 50 MCG/ACT nasal spray Place 1-2 sprays into both nostrils daily as needed for allergies or rhinitis.    [name] leflunomide (ARAVA) 20 MG tablet Take 20 mg by mouth daily.    . Melatonin 10 MG TABS Take 10 mg by mouth at bedtime as needed (sleep).     . simvastatin (ZOCOR) 20 MG tablet TAKE 1 TABLET BY MOUTH EVERYDAY AT BEDTIME 90 tablet  3  . furosemide (LASIX) 40 MG tablet Take 41m (1 tablet) by mouth daily alternating with 243m(1/2 tablet) daily. 45 tablet 3   No current facility-administered medications for this encounter.    Allergies:   Atorvastatin, Crestor [  rosuvastatin calcium], Enalapril, Lisinopril, Losartan, and Telmisartan   Social History:  The patient  reports that he quit smoking about 13 years ago. He has a 47.00 pack-year smoking history. He has never used smokeless tobacco. He reports current alcohol use. He reports that he does not use drugs.   Family History:  The patient's family history includes Gastric cancer in his mother; Heart attack (age of onset: 41) in his brother; Peripheral vascular disease in his father; Throat cancer in his father.   ROS:  Please see the history of present illness.   All other systems are personally reviewed and negative.   Exam:   BP (!) 88/64   Pulse 65   Wt 67.8 kg (149 lb 6.4 oz)   SpO2 91%   BMI 21.75 kg/m  General: NAD Neck: No JVD, no thyromegaly or thyroid nodule.  Lungs: Clear to auscultation bilaterally with normal respiratory effort. CV: Nondisplaced PMI.  Heart regular S1/S2, no S3/S4, no murmur.  No peripheral edema.  No carotid bruit.  Normal pedal pulses.  Abdomen: Soft, nontender, no hepatosplenomegaly, no distention.  Skin: Intact without lesions or rashes.  Neurologic: Alert and oriented x 3.  Psych: Normal affect. Extremities: No clubbing or cyanosis.  HEENT: Normal.    Recent Labs: 01/28/2020: Hemoglobin 10.5; Hemoglobin 10.2; Platelets 287 03/15/2020: BUN 32; Creatinine, Ser 1.96; Magnesium 2.2; Potassium 4.6; Sodium 138  Personally reviewed   Wt Readings from Last 3 Encounters:  08/16/20 67.8 kg (149 lb 6.4 oz)  07/14/20 70.3 kg (155 lb)  06/16/20 75.2 kg (165 lb 12.8 oz)     ASSESSMENT AND PLAN:  1. Chronic systolic CHF: Ischemic cardiomyopathy.  Medtronic ICD.  Echo (1/21) with EF 40-45%, stable. NYHA class III symptoms, prominent  fatigue.  Weight has trended down.  He is not volume overloaded on exam, by REDS clip, or by Optivol. BP is soft and he has mild orthostatic symptoms.  Creatinine up to 2.7.  Also of note, he is RV pacing 36% of the time.  - Decrease Lasix to 40 daily alternating with 20 daily.  - Stop valsartan for now with elevated creatinine and low BP.   - Continue Coreg 12.5 mg bid.   - He will stay off eplerenone with elevated creatinine.  - He did not tolerate Jardiance.  Wilder Glade is too expensive for him.  - Echo has been ordered.  - We discussed getting more exercise.  - I reviewed his device with the Medtronic rep. He is currently in NSR.  It is unclear why he is pacing as much as he is.  We turned the backup rate from 50 bpm to 40 bpm, and I will have him followup with EP for further evaluation.  2. CAD: Denies chest pain.  Had DES x 3 to ramus in 3/18 and DES to OM1 in 9/21.   - Continue ASA 81 mg daily, clopidogrel, Zocor 20 mg daily.   3. Hyperlipidemia: Stable on simvastatin without myalgias.  Has not been able to tolerate higher dose of simvastatin or other statins. Unable to tolerate Zetia due to constipation.  Recent LDL was 67 which is ok.   4. Hx of VT: Has been on amiodarone 100 mg daily. Has ICD.  He did not tolerate sotalol.  - Subclinical hypothyroidism (has had mildly elevated TSH with normal free T3 and free T4).   - LFTs normal this week.  - Knows to get a yearly eye exam - See below regarding ?amiodarone lung toxicity.  5. PAD: He denies claudication.  Normal ABIs in 12/20, followed by [facility].   6. AAA: 3.1 cm in 12/20, followed by [facility].  Will need repeat US for AAA, can schedule at next appt.  7. CKD: Stage 3.  Last creatinine 2.7, slow rise. - As above, stopping valsartan and will decrease Lasix.  - Needs nephrology referral.   8. Pulmonary: PFTs were done with increased dyspnea and amiodorone use, concerning for ILD/pulmonary fibrosis.  High resolution CT chest was concerning for ILD.   I have worried about amiodarone-induced lung toxicity. 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  He was offered antifibrotic treatment but wants to hold off for now. I think that some of his symptomatology is due to IPF.  His oxygen saturation was only 91% on RA today at rest, suspect this is reflective of IPF rather than pulmonary edema (he is not volume overloaded on exam).  - Can continue amidorone 100 mg daily as above.    - Will need pulmonary followup.   Followup in 1 month.   Signed, Loralie Champagne, MD  08/16/2020  [place] [address] [facility] and [place] [address] [phone] (office) [phone] (fax)

## 2020-08-16 NOTE — Patient Instructions (Signed)
EKG done today.  No Labs done today.  STOP taking Valsartan.  INCREASE Lasix 40mg  (1 tablet) by mouth daily alternating with 20mg  (1/2 tablet) daily.  No other medication changes were made. Please continue all current medications as prescribed.  Please be sure to drink boost or Ensure 2 times daily.   Your physician recommends that you schedule a follow-up appointment in: 10 days for a lab only appointment(paper prescription was given to you at your appointment today) at [facility] and in 1 month with Dr. Aundra Dubin.   If you have any questions or concerns before your next appointment please send [place] a message through [place] or call our office at [phone].    TO LEAVE A MESSAGE FOR THE NURSE SELECT OPTION 2, PLEASE LEAVE A MESSAGE INCLUDING: . YOUR NAME . DATE OF BIRTH . CALL BACK NUMBER . REASON FOR CALL**this is important as we prioritize the call backs  YOU WILL RECEIVE A CALL BACK THE SAME DAY AS LONG AS YOU CALL BEFORE 4:00 PM   Do the following things EVERYDAY: 1) Weigh yourself in the morning before breakfast. Write it down and keep it in a log. 2) Take your medicines as prescribed 3) Eat low salt foods--Limit salt (sodium) to 2000 mg per day.  4) Stay as active as you can everyday 5) Limit all fluids for the day to less than 2 liters   At the [facility], you and your health needs are our priority. As part of our continuing mission to provide you with exceptional heart care, we have created designated Provider Care Teams. These Care Teams include your primary Cardiologist (physician) and Advanced Practice Providers (APPs- Physician Assistants and Nurse Practitioners) who all work together to provide you with the care you need, when you need it.   You may see any of the following providers on your designated Care Team at your next follow up: [name] Dr Glori Bickers . Dr Loralie Champagne . Darrick Grinder, NP . Lyda Jester, PA . Audry Riles,  PharmD   Please be sure to bring in all your medications bottles to every appointment.

## 2020-08-16 NOTE — Telephone Encounter (Signed)
Pt contacted via mychart

## 2020-08-17 ENCOUNTER — Ambulatory Visit: Payer: Medicare Other | Admitting: Student

## 2020-08-17 ENCOUNTER — Encounter: Payer: Self-pay | Admitting: Student

## 2020-08-17 VITALS — BP 90/60 | HR 53 | Ht 70.0 in | Wt 151.6 lb

## 2020-08-17 DIAGNOSIS — I2510 Atherosclerotic heart disease of native coronary artery without angina pectoris: Secondary | ICD-10-CM | POA: Diagnosis not present

## 2020-08-17 DIAGNOSIS — I472 Ventricular tachycardia, unspecified: Secondary | ICD-10-CM

## 2020-08-17 DIAGNOSIS — I5022 Chronic systolic (congestive) heart failure: Secondary | ICD-10-CM

## 2020-08-17 DIAGNOSIS — I440 Atrioventricular block, first degree: Secondary | ICD-10-CM

## 2020-08-17 DIAGNOSIS — Z9861 Coronary angioplasty status: Secondary | ICD-10-CM

## 2020-08-17 LAB — CUP PACEART INCLINIC DEVICE CHECK
Battery Remaining Longevity: 83 mo
Battery Voltage: 2.96 V
Brady Statistic AP VP Percent: 0.03 %
Brady Statistic AP VS Percent: 0.01 %
Brady Statistic AS VP Percent: 31.13 %
Brady Statistic AS VS Percent: 68.83 %
Brady Statistic RA Percent Paced: 0.04 %
Brady Statistic RV Percent Paced: 30.98 %
Date Time Interrogation Session: 20220414101039
HighPow Impedance: 41 Ohm
HighPow Impedance: 59 Ohm
Implantable Lead Implant Date: 20100520
Implantable Lead Implant Date: 20100520
Implantable Lead Location: 753859
Implantable Lead Location: 753860
Implantable Lead Model: 5076
Implantable Lead Model: 6947
Implantable Pulse Generator Implant Date: 20180710
Lead Channel Impedance Value: 304 Ohm
Lead Channel Impedance Value: 380 Ohm
Lead Channel Impedance Value: 494 Ohm
Lead Channel Pacing Threshold Amplitude: 0.5 V
Lead Channel Pacing Threshold Amplitude: 1.25 V
Lead Channel Pacing Threshold Pulse Width: 0.4 ms
Lead Channel Pacing Threshold Pulse Width: 0.4 ms
Lead Channel Sensing Intrinsic Amplitude: 13.75 mV
Lead Channel Sensing Intrinsic Amplitude: 3.625 mV
Lead Channel Setting Pacing Amplitude: 1.5 V
Lead Channel Setting Pacing Amplitude: 2.5 V
Lead Channel Setting Pacing Pulse Width: 0.4 ms
Lead Channel Setting Sensing Sensitivity: 0.3 mV

## 2020-08-17 NOTE — Patient Instructions (Signed)
Medication Instructions:  Your physician recommends that you continue on your current medications as directed. Please refer to the Current Medication list given to you today.  *If you need a refill on your cardiac medications before your next appointment, please call your pharmacy*   Lab Work: None If you have labs (blood work) drawn today and your tests are completely normal, you will receive your results only by: [name] MyChart Message (if you have MyChart) OR . A paper copy in the mail If you have any lab test that is abnormal or we need to change your treatment, we will call you to review the results.   Follow-Up: At [facility], you and your health needs are our priority.  As part of our continuing mission to provide you with exceptional heart care, we have created designated Provider Care Teams.  These Care Teams include your primary Cardiologist (physician) and Advanced Practice Providers (APPs -  Physician Assistants and Nurse Practitioners) who all work together to provide you with the care you need, when you need it.  Your next appointment:   6 month(s)  The format for your next appointment:   In Person  Provider:   You may see one of the following Advanced Practice Providers on your designated Care Team:    Legrand Como "Jonni Sanger" [place], [place]

## 2020-08-17 NOTE — Progress Notes (Signed)
Electrophysiology Office Note Date: 08/17/2020  ID:  Nathan Pruitt, DOB 24-Jun-1945, MRN [id number]  PCP: Ronita Hipps, MD Primary Cardiologist: No primary care provider on file. Electrophysiologist: Thompson Grayer, MD   CC: Routine ICD follow-up  Nathan Pruitt is a 75 y.o. male seen today for Thompson Grayer, MD for routine electrophysiology followup.  Since last being seen in our clinic the patient reports doing OK. He has had intermittent lightheadedness, chronic prominent fatigue and NYHA III symptoms which are thought to be in at least part due to interstitial pulmonary fibrosis.  EP appointment scheduled due to increased RV pacing. He denies exertional chest pain or syncope. He is quite frustrated to have had to travel to [place] two days in a row from [place]. He has not had ICD shocks.   Device History: Medtronic Dual Chamber ICD implanted 11/2016 for VT, CHF  Past Medical History:  Diagnosis Date  . Abnormal PFTs 7/11   FVC 74%, FEV1 80%, ratio 75%, TLC 78%, DLCO 68%. this suggests a mild restrictive and obstructive defect. pt did have a response to bronchodilator. these PFTs were significantly better than the report from Dr. Alcide Clever in [place] done prior.   [name] AICD (automatic cardioverter/defibrillator) present   . Anxiety   . Atrial flutter ([place])    s/p isthmus ablation 5/10  . CAD (coronary artery disease)    hx of silent MI in 1993. likely an inferior MI. hx of 2D cardiogram in 3/09 showing EF of 40%. hx of myoview in [place] 3/09-nml. presented to [facility] 5/10 with VT and mildly elevated cardiac enzymes LHC (5/10): inferobasal dyskinesis with EF 35-40%. was chronic total occlusion of mid RCA with good collaterals. luminals LCA. this does not appear to be an acute cause of the 5/10 event  . Cancer ([place])    skin  . CHF (congestive heart failure) ([place])   . Chronic lower back pain   . HLD (hyperlipidemia)   . HTN (hypertension)   . Ischemic cardiomyopathy    EF 35-40% by LV-gram  5/10 with inferobasal dyskinesis. echo 5/10 showed EF 40% w/mild LVH, no sig. MR, inferobasal and posterobasal akinesis. echo (7/11): EF 50%, mild LVH, basal-mid inferoposterior akenesis  . Migraine    "visual migraine; maybe 12/year" (07/16/2016)  . PAD (peripheral artery disease) (HCC)    s/p L-to-R fem-fem bypass performed by Dr. Scot Dock at [facility] in 2009   . Rheumatoid arthritis (HCC)    on leflunomide  . Silent myocardial infarction [facility]) 1993   "silent"  . Tobacco abuse    47 pack year hx; quit 8/09  . Ventricular tachycardia ([place])    likely scar-mediated. 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 He has duel chamber Medtronic ICD   Past Surgical History:  Procedure Laterality Date  . CARDIAC DEFIBRILLATOR PLACEMENT  09/22/2008  . CATARACT EXTRACTION W/ INTRAOCULAR LENS  IMPLANT, BILATERAL Bilateral 05/2016 - 06/2016  . CORONARY BALLOON ANGIOPLASTY N/A 01/28/2020   Procedure: CORONARY BALLOON ANGIOPLASTY;  Surgeon: Jettie Booze, MD;  Location: [place] [facility];  Service: Cardiovascular;  Laterality: N/A;  . CORONARY CTO INTERVENTION  07/16/2016  . CORONARY CTO INTERVENTION N/A 07/16/2016   Procedure: Coronary CTO Intervention;  Surgeon: Belva Crome, MD;  Location: [place] [facility];  Service: Cardiovascular;  Laterality: N/A;  . EP IMPLANTABLE DEVICE  09/22/08   Medtronic, ICD Model Number:  [id number], ICD Serial Number: [id number]  . FEMORAL ARTERY - FEMORAL ARTERY BYPASS GRAFT  march 2009  left to right bypass, first @ [facility], second at [facility] by Dr Scot Dock  . ICD GENERATOR CHANGEOUT N/A 11/12/2016   Procedure: ICD Generator Changeout;  Surgeon: Thompson Grayer, MD;  Location: [place] [facility];  Service: Cardiovascular;  Laterality: N/A;  . RIGHT/LEFT HEART CATH AND CORONARY ANGIOGRAPHY N/A 07/12/2016   Procedure: Right/Left Heart Cath and Coronary Angiography;  Surgeon: Larey Dresser, MD;  Location: [place] [facility];  Service:  Cardiovascular;  Laterality: N/A;  . RIGHT/LEFT HEART CATH AND CORONARY ANGIOGRAPHY N/A 01/28/2020   Procedure: RIGHT/LEFT HEART CATH AND CORONARY ANGIOGRAPHY;  Surgeon: Larey Dresser, MD;  Location: [place] [facility];  Service: Cardiovascular;  Laterality: N/A;  . TONSILLECTOMY    . VASECTOMY    . VENTRICULAR ABLATION SURGERY  09/2008    Current Outpatient Medications  Medication Sig Dispense Refill  . amiodarone (PACERONE) 200 MG tablet Take 0.5 tablets (100 mg total) by mouth daily. 90 tablet 3  . aspirin EC 81 MG tablet Take 1 tablet (81 mg total) by mouth daily.    . carvedilol (COREG) 12.5 MG tablet Take 1 tablet (12.5 mg total) by mouth 2 (two) times daily with a meal. 90 tablet 3  . chlorhexidine (PERIDEX) 0.12 % solution Use as directed 15 mLs in the mouth or throat daily as needed (irritation).   3  . clopidogrel (PLAVIX) 75 MG tablet TAKE 1 TABLET (75 MG TOTAL) BY MOUTH DAILY WITH BREAKFAST. 90 tablet 2  . fluticasone (FLONASE) 50 MCG/ACT nasal spray Place 1-2 sprays into both nostrils daily as needed for allergies or rhinitis.    . furosemide (LASIX) 40 MG tablet Take $RemoveBef'40mg'juaOzsOpCL$  (1 tablet) by mouth daily alternating with $RemoveBeforeDE'20mg'mMIkTFqRrhUKWnr$  (1/2 tablet) daily. 45 tablet 3  . leflunomide (ARAVA) 20 MG tablet Take 20 mg by mouth daily.    . Melatonin 10 MG TABS Take 10 mg by mouth at bedtime as needed (sleep).     . simvastatin (ZOCOR) 20 MG tablet TAKE 1 TABLET BY MOUTH EVERYDAY AT BEDTIME 90 tablet 3   No current facility-administered medications for this visit.    Allergies:   Atorvastatin, Crestor [rosuvastatin calcium], Enalapril, Lisinopril, Losartan, and Telmisartan   Social History: Social History   Socioeconomic History  . Marital status: Single    Spouse name: Not on file  . Number of children: Not on file  . Years of education: Not on file  . Highest education level: Not on file  Occupational History  . Not on file  Tobacco Use  . Smoking status: Former Smoker    Packs/day:  1.00    Years: 47.00    Pack years: 47.00    Quit date: 2009    Years since quitting: 13.2  . Smokeless tobacco: Never Used  Vaping Use  . Vaping Use: Never used  Substance and Sexual Activity  . Alcohol use: Yes    Alcohol/week: 0.0 standard drinks    Comment: 07/15/2016 "nothing for the last couple years"  . Drug use: No    Comment: prior   . Sexual activity: Not on file  Other Topics Concern  . Not on file  Social History Narrative   Single; gets minimal exercise; 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 originally from [place] Strain: Not on file  Food Insecurity: Not on file  Transportation Needs: Not on file  Physical Activity: Not on file  Stress: Not on file  Social Connections: Not  on file  Intimate Partner Violence: Not on file    Family History: Family History  Problem Relation Age of Onset  . Gastric cancer Mother   . Throat cancer Father   . Peripheral vascular disease Father   . Heart attack Brother 61    Review of Systems: All other systems reviewed and are otherwise negative except as noted above.   Physical Exam: Vitals:   08/17/20 0901  BP: 90/60  Pulse: (!) 53  SpO2: 95%  Weight: 151 lb 9.6 oz (68.8 kg)  Height: $Remove'5\' 10"'QonwoLP$  (1.778 m)     GEN- The patient is well appearing, alert and oriented x 3 today.   HEENT: normocephalic, atraumatic; sclera clear, conjunctiva pink; hearing intact; oropharynx clear; neck supple, no JVP Lymph- no cervical lymphadenopathy Lungs- Clear to ausculation bilaterally, normal work of breathing.  No wheezes, rales, rhonchi Heart- Regular rate and rhythm, no murmurs, rubs or gallops, PMI not laterally displaced GI- soft, non-tender, non-distended, bowel sounds present, no hepatosplenomegaly Extremities- no clubbing or cyanosis. No edema; DP/PT/radial pulses 2+ bilaterally MS- no significant deformity or atrophy Skin- warm and dry, no rash or lesion; ICD pocket well healed Psych-  euthymic mood, full affect Neuro- strength and sensation are intact  ICD interrogation- reviewed in detail today,  See PACEART report  EKG:  EKG is ordered today. The ekg ordered today shows A paced at 58 bpm with 1st degree AV block and QRS 176 ms   Recent Labs: 01/28/2020: Hemoglobin 10.5; Hemoglobin 10.2; Platelets 287 03/15/2020: BUN 32; Creatinine, Ser 1.96; Magnesium 2.2; Potassium 4.6; Sodium 138   Wt Readings from Last 3 Encounters:  08/17/20 151 lb 9.6 oz (68.8 kg)  08/16/20 149 lb 6.4 oz (67.8 kg)  07/14/20 155 lb (70.3 kg)     Other studies Reviewed: Additional studies/ records that were reviewed today include: Previous EP and office notes.   Labs 08/15/20 Cr 2.7 K 4.7 AST 17 (WNL) ALT 13 (WNL) Alk Phos 99 (WNL) TSH 5.88 (high) Free T4 1.92 (WNL) Free T3 3.25 (WNL)  Assessment and Plan:  1.  Chronic systolic dysfunction s/p Medtronic dual chamber ICD  euvolemic today Stable on an appropriate medical regimen Normal ICD function See Pace Art report No changes today  2. 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 If his increased RV pacing is due to intermittent worsening AV block, though he was walked by Medtronic yesterday 1:1 conduction into 80-90s with exertion MVP on (to promote intrinsic conduction/VS) and LRL 40. At this point, we have no way to further decrease pacing. Reviewed with Medtronic.  Previously not felt to benefit from CRT pacing due to IVCD which appears to run between 150-170 ms.   3. VT No recent recurrence on low dose amiodarone.  Surveillance labs 08/15/20 OK as above.  4. PFTs concerning for ILD vs pulmonary fibrosis Has seen Dr. Vaughan Browner who thinks more likely IPF than amiodarone.  Continue to follow closely on amio Confounds HF symptoms.  Current medicines are reviewed at length with the patient today.   The patient does not have concerns regarding his medicines.  The following changes were made today:   None  Labs/ tests ordered today include:  Orders Placed This Encounter  Procedures  . CUP PACEART INCLINIC DEVICE CHECK    Disposition:   Follow up with EP APP/Dr. Allred in 6 months.   Jacalyn Lefevre, PA-C  08/17/2020 10:43 AM  [facility] [address] [place] [address] [phone] (office) [phone] (fax)

## 2020-08-21 ENCOUNTER — Encounter (HOSPITAL_COMMUNITY): Payer: Self-pay | Admitting: *Deleted

## 2020-08-22 ENCOUNTER — Other Ambulatory Visit (HOSPITAL_COMMUNITY): Payer: Self-pay | Admitting: Cardiology

## 2020-08-24 ENCOUNTER — Telehealth: Payer: Self-pay | Admitting: Pulmonary Disease

## 2020-08-24 NOTE — Telephone Encounter (Signed)
Called and spoke with Eual Fines with [facility] regarding the order for the home oxygen concentrator order they received.  Advised that the order is valid and provided Dr. Matilde Bash NPI #.  Nothing further needed.

## 2020-08-30 NOTE — Progress Notes (Signed)
Remote ICD transmission.   

## 2020-09-01 ENCOUNTER — Encounter (HOSPITAL_COMMUNITY): Payer: Self-pay

## 2020-09-04 ENCOUNTER — Other Ambulatory Visit (HOSPITAL_COMMUNITY): Payer: Self-pay | Admitting: Cardiology

## 2020-09-08 ENCOUNTER — Other Ambulatory Visit: Payer: Self-pay | Admitting: *Deleted

## 2020-09-08 DIAGNOSIS — I779 Disorder of arteries and arterioles, unspecified: Secondary | ICD-10-CM

## 2020-09-09 ENCOUNTER — Other Ambulatory Visit (HOSPITAL_COMMUNITY): Payer: Self-pay | Admitting: Cardiology

## 2020-09-13 ENCOUNTER — Other Ambulatory Visit (HOSPITAL_COMMUNITY): Payer: Self-pay | Admitting: Cardiology

## 2020-09-19 ENCOUNTER — Ambulatory Visit (HOSPITAL_COMMUNITY)
Admission: RE | Admit: 2020-09-19 | Discharge: 2020-09-19 | Disposition: A | Discharge status: Home / Self Care | Payer: Medicare Other | Source: Ambulatory Visit | Attending: Cardiology | Admitting: Cardiology

## 2020-09-19 ENCOUNTER — Encounter (HOSPITAL_COMMUNITY): Payer: Self-pay | Admitting: Cardiology

## 2020-09-19 ENCOUNTER — Other Ambulatory Visit (HOSPITAL_COMMUNITY): Payer: Self-pay

## 2020-09-19 ENCOUNTER — Other Ambulatory Visit: Payer: Self-pay

## 2020-09-19 VITALS — BP 104/60 | HR 62 | Wt 150.2 lb

## 2020-09-19 DIAGNOSIS — Z79899 Other long term (current) drug therapy: Secondary | ICD-10-CM | POA: Diagnosis not present

## 2020-09-19 DIAGNOSIS — I255 Ischemic cardiomyopathy: Secondary | ICD-10-CM | POA: Diagnosis not present

## 2020-09-19 DIAGNOSIS — I739 Peripheral vascular disease, unspecified: Secondary | ICD-10-CM | POA: Insufficient documentation

## 2020-09-19 DIAGNOSIS — E038 Other specified hypothyroidism: Secondary | ICD-10-CM | POA: Insufficient documentation

## 2020-09-19 DIAGNOSIS — Z87891 Personal history of nicotine dependence: Secondary | ICD-10-CM | POA: Diagnosis not present

## 2020-09-19 DIAGNOSIS — Z8616 Personal history of COVID-19: Secondary | ICD-10-CM | POA: Insufficient documentation

## 2020-09-19 DIAGNOSIS — Z95810 Presence of automatic (implantable) cardiac defibrillator: Secondary | ICD-10-CM | POA: Insufficient documentation

## 2020-09-19 DIAGNOSIS — E785 Hyperlipidemia, unspecified: Secondary | ICD-10-CM | POA: Insufficient documentation

## 2020-09-19 DIAGNOSIS — I5022 Chronic systolic (congestive) heart failure: Secondary | ICD-10-CM | POA: Diagnosis present

## 2020-09-19 DIAGNOSIS — Z7982 Long term (current) use of aspirin: Secondary | ICD-10-CM | POA: Diagnosis not present

## 2020-09-19 DIAGNOSIS — Z8249 Family history of ischemic heart disease and other diseases of the circulatory system: Secondary | ICD-10-CM | POA: Insufficient documentation

## 2020-09-19 DIAGNOSIS — I714 Abdominal aortic aneurysm, without rupture: Secondary | ICD-10-CM | POA: Diagnosis not present

## 2020-09-19 DIAGNOSIS — J84112 Idiopathic pulmonary fibrosis: Secondary | ICD-10-CM | POA: Diagnosis not present

## 2020-09-19 DIAGNOSIS — I2510 Atherosclerotic heart disease of native coronary artery without angina pectoris: Secondary | ICD-10-CM | POA: Diagnosis not present

## 2020-09-19 DIAGNOSIS — Z7902 Long term (current) use of antithrombotics/antiplatelets: Secondary | ICD-10-CM | POA: Diagnosis not present

## 2020-09-19 DIAGNOSIS — I472 Ventricular tachycardia, unspecified: Secondary | ICD-10-CM

## 2020-09-19 DIAGNOSIS — N183 Chronic kidney disease, stage 3 unspecified: Secondary | ICD-10-CM | POA: Diagnosis not present

## 2020-09-19 NOTE — Patient Instructions (Signed)
Continue current medications  Keep echocardiogram and follow up appointment as scheduled on 10/18/20  If you have any questions or concerns before your next appointment please send [place] a message through [place] or call our office at [phone].    TO LEAVE A MESSAGE FOR THE NURSE SELECT OPTION 2, PLEASE LEAVE A MESSAGE INCLUDING: . YOUR NAME . DATE OF BIRTH . CALL BACK NUMBER . REASON FOR CALL**this is important as we prioritize the call backs  [place] AS LONG AS YOU CALL BEFORE 4:00 PM  At the [facility], you and your health needs are our priority. As part of our continuing mission to provide you with exceptional heart care, we have created designated Provider Care Teams. These Care Teams include your primary Cardiologist (physician) and Advanced Practice Providers (APPs- Physician Assistants and Nurse Practitioners) who all work together to provide you with the care you need, when you need it.   You may see any of the following providers on your designated Care Team at your next follow up: [name] Dr Glori Bickers . Dr Loralie Champagne . Dr Vickki Muff . Darrick Grinder, NP . Lyda Jester, [place] . Audry Riles, PharmD   Please be sure to bring in all your medications bottles to every appointment.

## 2020-09-19 NOTE — Progress Notes (Signed)
ReDS Vest / Clip - 09/19/20 1600      ReDS Vest / Clip   Station Marker C    Ruler Value 25    ReDS Value Range Low volume    ReDS Actual Value 28

## 2020-09-20 NOTE — Progress Notes (Signed)
Date: 09/20/2020  ID:  [name], DOB Oct 19, 1945, MRN [id number]   Provider location: [place] [facility] Type of Visit: Established patient   PCP:  Ronita Hipps, MD  Cardiologist:  Dr. Aundra Dubin   History of Present Illness: Oronde Hallenbeck is a 75 y.o.with a history of CAD, PAD, ischemic CMP, and VT s/p ICD.  Echo in 2/17 showed EF 40% and regional WMAs.  In 3/18, he developed episodes of VT as well as increased dyspnea.  LHC/RHC was done, showing new occlusion of a large ramus (CTO, probably a month or so old at time of cath) and old CTO RCA with collaterals.  He had DES x 3 to ramus.  Filling pressures were optimized on RHC.  Amiodarone was increased to 200 mg daily from 100 mg daily.  No VT since PCI.   Echo in 6/19 showed EF 35-40% with wall motion abnormalities. Echo in 1/21 showed EF 40-45% with basal to mid inferolateral and inferior akinesis, mild MR, normal RV, PASP 35 mmHg.   LHC/RHC in 9/21 with normal filling pressures and cardiac output but 95% OM1 stenosis.  This was treated with DES to OM1.   He has been following with Dr. Vaughan Browner with idiopathic pulmonary fibrosis.  I was concerned that he could have amiodarone -related lung toxicity but Dr. Vaughan Browner did not think this was very likely.  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    Labs drawn in 1/22 showed K up to 5.9 and creatinine 4.  He was sent to the ER at [facility].  He was found to have COVID-19 and was admitted. He had a productive cough and sore throat.  He was thought to be dehydrated and got IV fluid.  Creatinine 2.3 at discharge.  Lasix, valsartan, and eplerenone were stopped.  Other meds were continued.  At last appointment, RV pacing percentage was elevated. I had EP evaluate this, and apparently there was no good way to adjust device to improve this.  He was not thought to be ideal for CRT upgrade.    He returns for followup of CHF.  Creatinine  down to 2.  Oxygen saturation runs 95-96% at rest, he does use oxygen when he goes shopping and finds that it helps.  He is short of breath with long walks or walking up stairs.  No chest pain.  Weight is stable. No orthopnea/PND.   MDT device interrogation: No AF/VT.  RV pacing 10-15% of the time.   ECG (personally reviewed): NSR, 1st degree AVB, RBBB, old inferior MI  REDS clip 28%  Labs (11/10): K 4.9, creatinine 1.4, LDL 70, HDL 31, LFTs normal Labs (3/11): K 4.4, creatinine 1.3, LDL 61, HDL 35 Labs (7/11): LDL 62, HDL 23 Labs (1/12): K 4.8, LFTs normal, creatinine 1.44, LDL 69, HDL 33 Labs (7/12): LFTs normal, LDL 81, HDL 36, HCT normal, K 4.3, creatinine 1.3, BNP 116, TSH normal Labs (1/13): LDL 39, HDl 31, LFTs normal, TSH  Labs (7/13): TSH normal, LFTs normal, LDL 69, HDL 40 Labs (8/13): K 4.5, creatinine 1.6 Labs (10/13): K 4.4, creatinine 1.4 Labs (3/14): LDL 108, HDL 30, LFTs normal Labs (9/14): K 4.2, creatinine 1.6, LFTs normal, LDL 114, TSH 10.6 (elevated), free T4/free T3 normal Labs (2/15): K 4.6, creatinine 1.5, LFTs normal, TSH 7.6 (elevated), free T4 and free T3 normal Labs (3/15): K 3.6, creatinine 1.5, LFTs normal, TSH normal Labs (9/15): K 4.3, creatinine  1.6, LFTs normal, TSH normal, LDL 115, HDL 24 Labs (10/16): K 4.5, creatinine 1.5, LDL 90, HDL 35, hgb 12.8 Labs (11/16): Low free T3, normal TSH and free T4 Labs (12/16): K 5, creatinine 1.8, LFTs normal, TSH 8.6 (mildly elevated) Labs (1/17): Free T4/TSH/free T3 normal, LFTs normal Labs (2/17): K 4.4, creatinine 2.4, LDL 131, HDL 34 Labs (4/17): K 4.2, creatinine 1.3, LDL 67, HDL 35 Labs (9/17): K 4.9, creatinine 2.2, LDL 54, HDL 35, free T3 and T4 normal, hgb 12 Labs (4/18): K 5, creatinine 1.78, LFTs normal, TSH elevated but free T3 and free T4 normal.  Labs (6/18): LDL 52, HDL 35, TSH elevated 6.8, LFTs normal Labs (7/18): K 4.3, creatinine 1.68 Labs (10/18): TSH 5.6, free T3 normal, free T4 normal, LFTs  normal, K 4.9, creatinine 1.4 Labs (11/18): K 4.5, creatinine 1.46, LFTs normal Labs (4/19): K 4.8, creatinine 1.6, LFTs normal Labs (6/19): TSH slightly elevated, free T3/T4 both normal, LFTs normal Labs (7/19): K 4.3, creatinine 1.3 Labs (10/19): LFTs normal, LDL 58, TGs 165, TSH 6.6 but free T4 and free T3 normal Labs (1/20): K 4.7, creatinine 1.7 Labs (2/20): LFTs normal Labs (4/20): K 4.6, creatinine 1.4, LDL 77, HDL 36, hgb 12.2, TSH mild elevation 7.4, free T4 and free T3 normal.  Labs (7/20): K 3.8, creatinine 1.6, hgb 12.2, LDL 81, TGs 158, HDL 35, TSH 7.7 (mildly elevated), LFTs normal Labs (11/20): K 4.5, creatinine 1.7, NT-proBNP 1358, TSH 5.6 (mild elevation), LDL 88, LFTs normal Labs (4/21): K 4.3, creatinine 1.6, hgb 12.4, LFTs, normal, TGs 105, LDL 81, HDL 40, TSH 5.6 (mildly elevated), free T3 and free T4 normal Labs (6/21): K 4.7, creatinine 1.9, AST/ALT normal, TSH mildly elevated at 7.9, free T3 and T4 normal, LDL 87, TGs 118 Labs (10/21): K 4.4, creatinine 1.8, LFTs normal, LDL 89, pro-BNP 1830, LFTs normal Labs (11/21): ESR 48, K 4.6, creatinine 1.96 Labs (12/21): ANA negative Labs (1/22): K 5.9, creatinine 4 => 2.3 Labs (2/22): K 4.6, creatinine 2.1 => 2.2, LDL 77, LFTs normal Labs (4/22): K 4.7,creatinine 2.7, TSH mildly elevated but free T3 and free T4 normal, hgb 10.2, LDL 67, LFTs normal Labs (5/22): creatinine 2.0  Allergies (verified):  No Known Drug Allergies  Past Medical History: 1. Coronary artery disease. The patient reports history of silent MI in 1993.  This was likely an inferior MI (see below).  - The patient presented to [facility] in 5/10 with VT and mildly elevated cardiac enzymes. LHC (5/10):  Inferobasal dyskinesis with EF 35-40%.  There was chronic total occlusion of the mid RCA with good collaterals.  Luminals LCA.  This did not appear to be an acute cause of the 5/10 event.  - LHC (3/18): Known occlusion of RCA with collaterals, new occlusion  of ramus.  DES x 3 to ramus.  - LHC (9/21): Known occlusion RCA with collaterals, 95% OMI treated with DES.  2. Hypertension.  3. Hyperlipidemia: Intolerant of higher doses of simvastatin, Crestor, Lipitor, pravastatin. Zetia caused constipation.  4. Remote tobacco abuse with 47-pack-year history, quitting in August 2009.  5. Peripheral arterial disease.  - Status post left-to-right fem-fem bypass performed in [place] in March 2009.  - Status post redo left-to-right fem-fem bypass performed by Dr. Scot Dock at [facility] in 2009.  - ABIs normal 3/15, ABIs normal 3/16, ABIs normal 3/17, ABIs normal 4/18, ABIs normal 4/19, ABIs normal 12/20.  6. Rheumatoid arthritis, on leflunomide.  7.  Ischemic cardiomyopathy:  EF  35-40% by LV-gram 5/10 with inferobasal dyskinesis.  Echo 5/10 showed EF 40% with mild LVH, no significant MR, inferobasal and posterobasal akinesis.  Echo (7/11): EF 50%, mild LVH, basal-mid inferoposterior akinesis.  Echo (7/12): EF 45-50% with basal anterolateral, basal posterior, and basal to mid inferior akinesis.  Echo (4/16): EF 40-45%, basal to mid inferolateral AK, basal inferior AK, basal to mid anterolateral HK.  Echo (2/17): EF 40% with basal to mid inferior akinesis, basal inferolateral aneurysm, mid inferolateral akinesis, basal anterolateral hypokinesis, normal RV size and systolic function, PASP 25 mmHg.  - Echo (3/18) with EF 30-35%, moderate LV dilation, moderate MR, mildly decreased RV systolic function, PASP 51 mmmHg. - RHC (3/18): mean RA 4, PA 38/12, mean PCWP 17, CI 2.2.  - Echo (6/19): EF 35-40%, inferior/inferolateral/anterolateral WMAs, moderate MR likely infarct-related, normal RV size with mildly decreased systolic function.  - Echo (1/21): EF 35-40% with wall motion abnormalities. Echo was done today and reviewed, EF 40-45% with basal to mid inferolateral and inferior akinesis, mild MR, normal RV, PASP 35 mmHg.  - RHC (9/21): mean RA 4, PA 33/12, mean PCWP 9, CI  3.12 8.  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  He has a dual chamber Medtronic ICD.  9.  Atrial flutter:  Status post isthmus ablation 5/10.   10. Restrictive lung defect: PFTs (7/11): FVC 74%, FEV1 80%, ratio 75%, TLC 78%, DLCO 68%.  This suggests a mild restrictive defect and a mild obstructive defect.  He did have response to bronchodilator. These PFTs were significantly better than the report from Dr. Alcide Clever in [place] done prior.  He had last PFTs in 2/12 with no significant change.  - PFTs (9/21) with significantly decreased DLCO and concern for ILD/pulmonary fibrosis.  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  11.  Anxiety 12.  Chronic cough: No relief with change from ACEI to ARB or with trial of PPI.  13.  CKD: Stage 3.  14. Mitral regurgitation: Moderate on 6/19 echo, suspect infarct-related.  15. AAA: 3.1 cm AAA on abdominal US 12/20  16. COVID-19 infection: 1/22.   Current Outpatient Medications  Medication Sig Dispense Refill  . amiodarone (PACERONE) 200 MG tablet Take 0.5 tablets (100 mg total) by mouth daily. 90 tablet 3  . aspirin EC 81 MG tablet Take 1 tablet (81 mg total) by mouth daily.    . carvedilol (COREG) 12.5 MG tablet Take 1 tablet (12.5 mg total) by mouth 2 (two) times daily with a meal. 90 tablet 3  . chlorhexidine (PERIDEX) 0.12 % solution Use as directed 15 mLs in the mouth or throat daily as needed (irritation).   3  . clopidogrel (PLAVIX) 75 MG tablet TAKE 1 TABLET (75 MG TOTAL) BY MOUTH DAILY WITH BREAKFAST. 90 tablet 2  . fluticasone (FLONASE) 50 MCG/ACT nasal spray Place 1-2 sprays into both nostrils daily as needed for allergies or rhinitis.    . furosemide (LASIX) 40 MG tablet Take 22m (1 tablet) by mouth daily alternating with 244m(1/2 tablet) daily. 45 tablet 3  . leflunomide (ARAVA) 20 MG tablet Take 20 mg by mouth daily.    . Melatonin 10 MG TABS Take 10 mg by  mouth at bedtime as needed (sleep).     . simvastatin (ZOCOR) 20 MG tablet TAKE 1 TABLET BY MOUTH EVERYDAY AT BEDTIME 90 tablet 3   No current facility-administered medications for this encounter.  Allergies:   Atorvastatin, Crestor [rosuvastatin calcium], Enalapril, Lisinopril, Losartan, and Telmisartan   Social History:  The patient  reports that he quit smoking about 13 years ago. He has a 47.00 pack-year smoking history. He has never used smokeless tobacco. He reports current alcohol use. He reports that he does not use drugs.   Family History:  The patient's family history includes Gastric cancer in his mother; Heart attack (age of onset: 76) in his brother; Peripheral vascular disease in his father; Throat cancer in his father.   ROS:  Please see the history of present illness.   All other systems are personally reviewed and negative.   Exam:   BP 104/60   Pulse 62   Wt 68.1 kg (150 lb 3.2 oz)   SpO2 97%   BMI 21.55 kg/m  General: NAD Neck: No JVD, no thyromegaly or thyroid nodule.  Lungs: Clear to auscultation bilaterally with normal respiratory effort. CV: Nondisplaced PMI.  Heart regular S1/S2, no S3/S4, no murmur.  No peripheral edema.  No carotid bruit.  Normal pedal pulses.  Abdomen: Soft, nontender, no hepatosplenomegaly, no distention.  Skin: Intact without lesions or rashes.  Neurologic: Alert and oriented x 3.  Psych: Normal affect. Extremities: No clubbing or cyanosis.  HEENT: Normal.    Recent Labs: 01/28/2020: Hemoglobin 10.5; Hemoglobin 10.2; Platelets 287 03/15/2020: BUN 32; Creatinine, Ser 1.96; Magnesium 2.2; Potassium 4.6; Sodium 138  Personally reviewed   Wt Readings from Last 3 Encounters:  09/19/20 68.1 kg (150 lb 3.2 oz)  08/17/20 68.8 kg (151 lb 9.6 oz)  08/16/20 67.8 kg (149 lb 6.4 oz)     ASSESSMENT AND PLAN:  1. Chronic systolic CHF: Ischemic cardiomyopathy.  Medtronic ICD.  Echo (1/21) with EF 40-45%, stable. NYHA class III symptoms,  prominent fatigue.  Weight is stable.  He is not volume overloaded on exam or REDS clip.  RV pacing percentage down to 10-15% on today's device interrogation.   - Continue Lasix 40 daily alternating with 20 daily.  - He is currently off valsartan and eplerenone with soft BP and elevated creatinine.  I would like him to try to go back on eplerenone at 25 mg daily, he does not want to do this yet.   - Continue Coreg 12.5 mg bid.   - He did not tolerate Jardiance.  Wilder Glade is too expensive for him.  - Echo at followup appt.  - We discussed getting more exercise.  2. CAD: Denies chest pain.  Had DES x 3 to ramus in 3/18 and DES to OM1 in 9/21.   - Continue ASA 81 mg daily, clopidogrel, Zocor 20 mg daily.   3. Hyperlipidemia: Stable on simvastatin without myalgias.  Has not been able to tolerate higher dose of simvastatin or other statins. Unable to tolerate Zetia due to constipation.  Recent LDL was 67 which is ok.   4. Hx of VT: Has been on amiodarone 100 mg daily. Has ICD.  He did not tolerate sotalol.  - Subclinical hypothyroidism (has had mildly elevated TSH with normal free T3 and free T4).   - Check LFTs next visit.  - Knows to get a yearly eye exam - See below regarding ?amiodarone lung toxicity.  5. PAD: He denies claudication.  Normal ABIs in 12/20, followed by [facility] (has appt soon).   6. AAA: 3.1 cm in 12/20, followed by [facility] (has appointment soon).  7. CKD: Stage 3.  Recently, creatinine improved (2).  - Follows with nephrology now.  8. Pulmonary: PFTs were done with increased dyspnea and amiodorone use, concerning for ILD/pulmonary fibrosis.  High resolution CT chest was concerning for ILD.  I have worried about amiodarone-induced lung toxicity. 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  He was offered antifibrotic treatment but wants to hold off for now. I think that some of his symptomatology is due to IPF.  His oxygen saturation was only  91% on RA today at rest, suspect this is reflective of IPF rather than pulmonary edema (he is not volume overloaded on exam).  - Can continue amidorone 100 mg daily as above.    - Will need pulmonary followup.   Followup in June with echo as scheduled.    Signed, Loralie Champagne, MD  09/20/2020  [place] [address] [facility] [place] [address] [phone] (office) [phone] (fax)

## 2020-09-29 ENCOUNTER — Ambulatory Visit: Payer: Medicare Other | Admitting: Physician Assistant

## 2020-09-29 ENCOUNTER — Ambulatory Visit (INDEPENDENT_AMBULATORY_CARE_PROVIDER_SITE_OTHER)
Admission: RE | Admit: 2020-09-29 | Discharge: 2020-09-29 | Disposition: A | Discharge status: Home / Self Care | Payer: Medicare Other | Source: Ambulatory Visit | Attending: Vascular Surgery | Admitting: Vascular Surgery

## 2020-09-29 ENCOUNTER — Other Ambulatory Visit: Payer: Self-pay | Admitting: *Deleted

## 2020-09-29 ENCOUNTER — Other Ambulatory Visit: Payer: Self-pay

## 2020-09-29 ENCOUNTER — Ambulatory Visit (HOSPITAL_COMMUNITY)
Admission: RE | Admit: 2020-09-29 | Discharge: 2020-09-29 | Disposition: A | Discharge status: Home / Self Care | Payer: Medicare Other | Source: Ambulatory Visit | Attending: Vascular Surgery | Admitting: Vascular Surgery

## 2020-09-29 VITALS — BP 122/73 | HR 54 | Temp 98.2°F | Resp 14 | Ht 70.0 in | Wt 147.0 lb

## 2020-09-29 DIAGNOSIS — I714 Abdominal aortic aneurysm, without rupture, unspecified: Secondary | ICD-10-CM

## 2020-09-29 DIAGNOSIS — I779 Disorder of arteries and arterioles, unspecified: Secondary | ICD-10-CM

## 2020-09-29 NOTE — Progress Notes (Signed)
HISTORY AND PHYSICAL     CC:  follow up. Requesting Provider:  Ronita Hipps, MD  HPI: This is a 75 y.o. male who is here today for follow up for PAD.  He has hx of redo left to right femoral to femoral bypass in 2009 by Dr. Scot Dock.  He has hx of AAA measuring 3.0cm.  Pt was last seen 04/08/2019 and at that time, he did not have any claudication sx, non healing wounds.  He admitted to not walking much as he did not have much stamina.    He has hx VT and defibrillator.  He has hx of CAD with hx of cardiac cath in 2018 with hx of cardiac stenting.  He was seen by heart failure last week.  Pt has EF of 40-45%.  He was not volume overloaded.  He has hx of CKD III. He has hx of pulmonary fibrosis.    The pt returns today for follow up.  He states that he has not had any issues with his legs.  He denies any claudication or non healing wounds.  He denies any back or abdominal pain.  He states he was diagnosed with covid19 in February 2022 but was incidental finding.  He states he has had some weight loss that was intentional.   He is on home oxygen.  The pt is on a statin for cholesterol management.    The pt is on an aspirin.    Other AC:  Plavix The pt is on BB for hypertension.  The pt does not have diabetes. Tobacco hx:  former  Pt does not have family hx of AAA.  Past Medical History:  Diagnosis Date  . Abnormal PFTs 7/11   FVC 74%, FEV1 80%, ratio 75%, TLC 78%, DLCO 68%. this suggests a mild restrictive and obstructive defect. pt did have a response to bronchodilator. these PFTs were significantly better than the report from Dr. Alcide Clever in [place] done prior.   [name] AICD (automatic cardioverter/defibrillator) present   . Anxiety   . Atrial flutter ([place])    s/p isthmus ablation 5/10  . CAD (coronary artery disease)    hx of silent MI in 1993. likely an inferior MI. hx of 2D cardiogram in 3/09 showing EF of 40%. hx of myoview in [place] 3/09-nml. presented to [facility] 5/10 with VT and mildly  elevated cardiac enzymes LHC (5/10): inferobasal dyskinesis with EF 35-40%. was chronic total occlusion of mid RCA with good collaterals. luminals LCA. this does not appear to be an acute cause of the 5/10 event  . Cancer ([place])    skin  . CHF (congestive heart failure) ([place])   . Chronic lower back pain   . HLD (hyperlipidemia)   . HTN (hypertension)   . Ischemic cardiomyopathy    EF 35-40% by LV-gram 5/10 with inferobasal dyskinesis. echo 5/10 showed EF 40% w/mild LVH, no sig. MR, inferobasal and posterobasal akinesis. echo (7/11): EF 50%, mild LVH, basal-mid inferoposterior akenesis  . Migraine    "visual migraine; maybe 12/year" (07/16/2016)  . PAD (peripheral artery disease) (HCC)    s/p L-to-R fem-fem bypass performed by Dr. Scot Dock at [facility] in 2009   . Rheumatoid arthritis (HCC)    on leflunomide  . 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 Tobacco abuse    47 pack year hx; quit 8/09  . Ventricular tachycardia ([place])    likely scar-mediated. 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 He has duel  chamber Medtronic ICD    Past Surgical History:  Procedure Laterality Date  . CARDIAC DEFIBRILLATOR PLACEMENT  09/22/2008  . CATARACT EXTRACTION W/ INTRAOCULAR LENS  IMPLANT, BILATERAL Bilateral 05/2016 - 06/2016  . CORONARY BALLOON ANGIOPLASTY N/A 01/28/2020   Procedure: CORONARY BALLOON ANGIOPLASTY;  Surgeon: Jettie Booze, MD;  Location: [place] [facility];  Service: Cardiovascular;  Laterality: N/A;  . CORONARY CTO INTERVENTION  07/16/2016  . CORONARY CTO INTERVENTION N/A 07/16/2016   Procedure: Coronary CTO Intervention;  Surgeon: Belva Crome, MD;  Location: [place] [facility];  Service: Cardiovascular;  Laterality: N/A;  . EP IMPLANTABLE DEVICE  09/22/08   Medtronic, ICD Model Number:  D274DRG, ICD Serial Number: [id number]  . FEMORAL ARTERY - FEMORAL ARTERY BYPASS GRAFT  march 2009   left to right bypass, first @ [facility], second at  [facility] by Dr Scot Dock  . ICD GENERATOR CHANGEOUT N/A 11/12/2016   Procedure: ICD Generator Changeout;  Surgeon: Thompson Grayer, MD;  Location: [place] [facility];  Service: Cardiovascular;  Laterality: N/A;  . RIGHT/LEFT HEART CATH AND CORONARY ANGIOGRAPHY N/A 07/12/2016   Procedure: Right/Left Heart Cath and Coronary Angiography;  Surgeon: Larey Dresser, MD;  Location: [place] [facility];  Service: Cardiovascular;  Laterality: N/A;  . RIGHT/LEFT HEART CATH AND CORONARY ANGIOGRAPHY N/A 01/28/2020   Procedure: RIGHT/LEFT HEART CATH AND CORONARY ANGIOGRAPHY;  Surgeon: Larey Dresser, MD;  Location: [place] [facility];  Service: Cardiovascular;  Laterality: N/A;  . TONSILLECTOMY    . VASECTOMY    . VENTRICULAR ABLATION SURGERY  09/2008    Allergies  Allergen Reactions  . Atorvastatin Other (See Comments)    Muscle pain  . Crestor [Rosuvastatin Calcium] Other (See Comments)    Muscle pain  . Enalapril Other (See Comments)    Upset stomach  . Lisinopril Cough  . Losartan Other (See Comments)    Muscle pain   . Telmisartan Other (See Comments)    Stomach ache    Current Outpatient Medications  Medication Sig Dispense Refill  . amiodarone (PACERONE) 200 MG tablet Take 0.5 tablets (100 mg total) by mouth daily. 90 tablet 3  . aspirin EC 81 MG tablet Take 1 tablet (81 mg total) by mouth daily.    . carvedilol (COREG) 12.5 MG tablet Take 1 tablet (12.5 mg total) by mouth 2 (two) times daily with a meal. 90 tablet 3  . chlorhexidine (PERIDEX) 0.12 % solution Use as directed 15 mLs in the mouth or throat daily as needed (irritation).   3  . clopidogrel (PLAVIX) 75 MG tablet TAKE 1 TABLET (75 MG TOTAL) BY MOUTH DAILY WITH BREAKFAST. 90 tablet 2  . fluticasone (FLONASE) 50 MCG/ACT nasal spray Place 1-2 sprays into both nostrils daily as needed for allergies or rhinitis.    . furosemide (LASIX) 40 MG tablet Take 40mg  (1 tablet) by mouth daily alternating with 20mg  (1/2 tablet) daily. 45 tablet  3  . leflunomide (ARAVA) 20 MG tablet Take 20 mg by mouth daily.    . Melatonin 10 MG TABS Take 10 mg by mouth at bedtime as needed (sleep).     . simvastatin (ZOCOR) 20 MG tablet TAKE 1 TABLET BY MOUTH EVERYDAY AT BEDTIME 90 tablet 3   No current facility-administered medications for this visit.    Family History  Problem Relation Age of Onset  . Gastric cancer Mother   . Throat cancer Father   . Peripheral vascular disease Father   .  Heart attack Brother 60    Social History   Socioeconomic History  . Marital status: Single    Spouse name: Not on file  . Number of children: Not on file  . Years of education: Not on file  . Highest education level: Not on file  Occupational History  . Not on file  Tobacco Use  . Smoking status: Former Smoker    Packs/day: 1.00    Years: 47.00    Pack years: 47.00    Quit date: 2009    Years since quitting: 13.4  . Smokeless tobacco: Never Used  Vaping Use  . Vaping Use: Never used  Substance and Sexual Activity  . Alcohol use: Yes    Alcohol/week: 0.0 standard drinks    Comment: 07/15/2016 "nothing for the last couple years"  . Drug use: No    Comment: prior   . Sexual activity: Not on file  Other Topics Concern  . Not on file  Social History Narrative   Single; gets minimal exercise; 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 originally from [place] Strain: Not on file  Food Insecurity: Not on file  Transportation Needs: Not on file  Physical Activity: Not on file  Stress: Not on file  Social Connections: Not on file  Intimate Partner Violence: Not on file     REVIEW OF SYSTEMS:   [X]  denotes positive finding, [ ]  denotes negative finding Cardiac  Comments:  Chest pain or chest pressure:    Shortness of breath upon exertion:    Short of breath when lying flat:    Irregular heart rhythm:        Vascular    Pain in calf, thigh, or hip brought on by ambulation:    Pain in feet at  night that wakes you up from your sleep:     Blood clot in your veins:    Leg swelling:         Pulmonary    Oxygen at home: x   Productive cough:     Wheezing:         Neurologic    Sudden weakness in arms or legs:     Sudden numbness in arms or legs:     Sudden onset of difficulty speaking or slurred speech:    Temporary loss of vision in one eye:     Problems with dizziness:         Gastrointestinal    Blood in stool:     Vomited blood:         Genitourinary    Burning when urinating:     Blood in urine:        Psychiatric    Major depression:         Hematologic    Bleeding problems:    Problems with blood clotting too easily:        Skin    Rashes or ulcers:        Constitutional    Fever or chills:      PHYSICAL EXAMINATION:  Today's Vitals   09/29/20 1056 09/29/20 1102  BP: 122/73   Pulse: (!) 54   Resp: 14   Temp: 98.2 F (36.8 C)   TempSrc: Temporal   SpO2: 99%   Weight: 147 lb (66.7 kg)   Height: 5\' 10"  (1.778 m)   PainSc: 0-No pain 0-No pain   Body mass index is 21.09 kg/m.   General:  WDWN  in NAD; vital signs documented above Gait: Normal HENT: WNL, normocephalic Pulmonary: normal non-labored breathing , without wheezing Cardiac: regular HR, without  Murmur; without carotid bruits Abdomen: soft, NT, no masses; aortic pulse is palpable Skin: without rashes Vascular Exam/Pulses:  Right Left  Radial 2+ (normal) 2+ (normal)  Femoral 2+ (normal) 2+ (normal)  Popliteal Unable to palpate Unable to palpate  DP Unable to palpate Unable to palpate  PT Faintly palpable Unable to palpate   Extremities: without ischemic changes, without Gangrene , without cellulitis; without open wounds; he has mild pitting edema BLE.  His feet are warm and well perfused.  His fem fem bypass has palpable pulse Musculoskeletal: no muscle wasting or atrophy  Neurologic: A&O X 3;  No focal weakness or paresthesias are detected Psychiatric:  The pt has Normal  affect.   Non-Invasive Vascular Imaging:   ABI's/TBI's on 09/29/2020: Right:  1.03/0.81 - Great toe pressure: 109 Left:  0.97/0.80 - Great toe pressure: 107  AAA Arterial duplex on 09/29/2020: Abdominal Aorta Findings:  +-----------+-------+----------+----------+---------+--------+--------+  Location  AP (cm)Trans (cm)PSV (cm/s)Waveform ThrombusComments  +-----------+-------+----------+----------+---------+--------+--------+  Proximal  2.45  2.27   46    biphasic           +-----------+-------+----------+----------+---------+--------+--------+  Mid    2.08  2.14   42    triphasic          +-----------+-------+----------+----------+---------+--------+--------+  Distal   3.10  3.13                       +-----------+-------+----------+----------+---------+--------+--------+  RT CIA Prox1.1  1.0                  No flow   +-----------+-------+----------+----------+---------+--------+--------+  LT CIA Prox1.4  1.4    148    biphasic           +-----------+-------+----------+----------+---------+--------+--------+   Summary:  Abdominal Aorta: The largest aortic diameter remains essentially unchanged compared to prior exam. Previous diameter measurement was 3.1 cm obtained on 06/08/2018.   Previous ABI's/TBI's on 04/08/2019: Right:  1.07/0.65 - Great toe pressure: 77 Left:  0.97/0.71 - Great toe pressure:  84  Previous AAA arterial duplex on 04/08/2019: Summary:  Abdominal Aorta: The largest aortic diameter remains essentially unchanged compared to prior exam. Previous diameter measurement was 3.0 x 2.9 cm obtained on 08/15/2017. The largest diameter today is 3.13 x 2.99 cms.   Note: The Femoral to femoral bypass graft appears patent.  ASSESSMENT/PLAN:: 75 y.o. male here for follow up for hx of redo left to right femoral to femoral bypass in 2009 by  Dr. Scot Dock.  He has hx of AAA measuring 3.0cm.  PAD -pt femoral to femoral bypass is patent and his ABI remain unchanged and normal.  He does not have any wounds or claudication or rest pain.   -pt will f/u in 18 months with ABI.  He knows to call sooner if he has any issues. -continue asa/statin/plavix  AAA -is abdominal aorta is prominent and easily palpable.  His duplex today reveals this is unchanged and remains around 3.1cm.  Discussed with him that he is at very low risk of rupture but if he develops severe sudden onset of abdominal or back pain, he is to call 911 and he knows this would be a life threatening situation if this were to ever happen.  -f/u 18 months with AAA duplex   Leontine Locket, [facility] [phone]  Clinic MD:   Donzetta Matters

## 2020-10-06 ENCOUNTER — Other Ambulatory Visit (HOSPITAL_COMMUNITY): Payer: Self-pay

## 2020-10-06 ENCOUNTER — Telehealth (HOSPITAL_COMMUNITY): Payer: Self-pay | Admitting: Pharmacy Technician

## 2020-10-06 NOTE — Telephone Encounter (Signed)
Advanced Heart Failure Patient Advocate Encounter  Called [facility] to see if we could get a determination on the patient's Farxiga tier exception appeal request. The determination still stands, the tier exception request has been denied. There is no medication in the tier 2 class that is the same as [place]. Wilder Glade is a tier 3 medication, the co-pay will remain the same. 30 day co-pay is $40 and 90 days is $120. The patient has declined the addition of Farxiga due to the cost. Him receiving Wilder Glade would immediately through him into the donut hole, making his other medications unaffordable. He does not qualify for assistance due to income.   Advised the patient to give [place] a call if he changes his mind, but I understood from a financial aspect that it was not feasible for him at this time. Routing message to Dr. Aundra Dubin as well.  Charlann Boxer, CPhT

## 2020-10-11 ENCOUNTER — Other Ambulatory Visit (HOSPITAL_COMMUNITY): Payer: Self-pay | Admitting: Cardiology

## 2020-10-16 ENCOUNTER — Telehealth (HOSPITAL_COMMUNITY): Payer: Self-pay | Admitting: Pharmacy Technician

## 2020-10-16 ENCOUNTER — Encounter (HOSPITAL_COMMUNITY): Payer: Self-pay

## 2020-10-16 NOTE — Telephone Encounter (Signed)
Advanced Heart Failure Patient Advocate Encounter  Received a call from the patient today. He wanted to make sure whether his cholesterol was being checked with some of the standing orders he has at [facility]. It looks like he would need to add a lipid panel order to get that checked.  Requested a physical copy of the orders to take with him. He comes for an appointment with Dr. Aundra Dubin on Wednesday, 6/15. He is requesting a lipid panel be drawn then, since his has not been checked recently. Made note in patient's upcoming appointment about lipid panel request.  Had concerns over Creatinine recent levels, will discuss with the provider at his appointment.   Charlann Boxer, CPhT

## 2020-10-18 ENCOUNTER — Encounter (HOSPITAL_COMMUNITY): Payer: Self-pay

## 2020-10-18 ENCOUNTER — Ambulatory Visit (HOSPITAL_BASED_OUTPATIENT_CLINIC_OR_DEPARTMENT_OTHER)
Admission: RE | Admit: 2020-10-18 | Discharge: 2020-10-18 | Disposition: A | Discharge status: Home / Self Care | Payer: Medicare Other | Source: Ambulatory Visit | Attending: Cardiology | Admitting: Cardiology

## 2020-10-18 ENCOUNTER — Encounter (HOSPITAL_COMMUNITY): Payer: Self-pay | Admitting: Cardiology

## 2020-10-18 ENCOUNTER — Ambulatory Visit (HOSPITAL_COMMUNITY)
Admission: RE | Admit: 2020-10-18 | Discharge: 2020-10-18 | Disposition: A | Discharge status: Home / Self Care | Payer: Medicare Other | Source: Ambulatory Visit | Attending: Family Medicine | Admitting: Family Medicine

## 2020-10-18 ENCOUNTER — Other Ambulatory Visit: Payer: Self-pay

## 2020-10-18 VITALS — BP 124/78 | HR 55 | Wt 155.6 lb

## 2020-10-18 DIAGNOSIS — Z87891 Personal history of nicotine dependence: Secondary | ICD-10-CM | POA: Diagnosis not present

## 2020-10-18 DIAGNOSIS — Z79899 Other long term (current) drug therapy: Secondary | ICD-10-CM | POA: Diagnosis not present

## 2020-10-18 DIAGNOSIS — I5022 Chronic systolic (congestive) heart failure: Secondary | ICD-10-CM

## 2020-10-18 DIAGNOSIS — Z7901 Long term (current) use of anticoagulants: Secondary | ICD-10-CM | POA: Diagnosis not present

## 2020-10-18 DIAGNOSIS — Z95810 Presence of automatic (implantable) cardiac defibrillator: Secondary | ICD-10-CM | POA: Diagnosis not present

## 2020-10-18 DIAGNOSIS — I255 Ischemic cardiomyopathy: Secondary | ICD-10-CM | POA: Diagnosis not present

## 2020-10-18 DIAGNOSIS — I2510 Atherosclerotic heart disease of native coronary artery without angina pectoris: Secondary | ICD-10-CM | POA: Diagnosis not present

## 2020-10-18 DIAGNOSIS — J84112 Idiopathic pulmonary fibrosis: Secondary | ICD-10-CM | POA: Insufficient documentation

## 2020-10-18 DIAGNOSIS — E038 Other specified hypothyroidism: Secondary | ICD-10-CM | POA: Diagnosis not present

## 2020-10-18 DIAGNOSIS — Z8616 Personal history of COVID-19: Secondary | ICD-10-CM | POA: Diagnosis not present

## 2020-10-18 DIAGNOSIS — I130 Hypertensive heart and chronic kidney disease with heart failure and stage 1 through stage 4 chronic kidney disease, or unspecified chronic kidney disease: Secondary | ICD-10-CM | POA: Insufficient documentation

## 2020-10-18 DIAGNOSIS — Z7982 Long term (current) use of aspirin: Secondary | ICD-10-CM | POA: Diagnosis not present

## 2020-10-18 DIAGNOSIS — E785 Hyperlipidemia, unspecified: Secondary | ICD-10-CM | POA: Diagnosis not present

## 2020-10-18 DIAGNOSIS — Z8249 Family history of ischemic heart disease and other diseases of the circulatory system: Secondary | ICD-10-CM | POA: Insufficient documentation

## 2020-10-18 DIAGNOSIS — N183 Chronic kidney disease, stage 3 unspecified: Secondary | ICD-10-CM | POA: Diagnosis not present

## 2020-10-18 DIAGNOSIS — Z955 Presence of coronary angioplasty implant and graft: Secondary | ICD-10-CM | POA: Diagnosis not present

## 2020-10-18 DIAGNOSIS — Z7902 Long term (current) use of antithrombotics/antiplatelets: Secondary | ICD-10-CM | POA: Insufficient documentation

## 2020-10-18 DIAGNOSIS — I340 Nonrheumatic mitral (valve) insufficiency: Secondary | ICD-10-CM | POA: Diagnosis not present

## 2020-10-18 DIAGNOSIS — Z888 Allergy status to other drugs, medicaments and biological substances status: Secondary | ICD-10-CM | POA: Insufficient documentation

## 2020-10-18 LAB — COMPREHENSIVE METABOLIC PANEL
ALT: 12 U/L (ref 0–44)
AST: 16 U/L (ref 15–41)
Albumin: 3.4 g/dL — ABNORMAL LOW (ref 3.5–5.0)
Alkaline Phosphatase: 84 U/L (ref 38–126)
Anion gap: 6 (ref 5–15)
BUN: 20 mg/dL (ref 8–23)
CO2: 25 mmol/L (ref 22–32)
Calcium: 8.8 mg/dL — ABNORMAL LOW (ref 8.9–10.3)
Chloride: 105 mmol/L (ref 98–111)
Creatinine, Ser: 2.21 mg/dL — ABNORMAL HIGH (ref 0.61–1.24)
GFR, Estimated: 30 mL/min — ABNORMAL LOW (ref >60)
Glucose, Bld: 103 mg/dL — ABNORMAL HIGH (ref 70–99)
Potassium: 4.7 mmol/L (ref 3.5–5.1)
Sodium: 136 mmol/L (ref 135–145)
Total Bilirubin: 0.4 mg/dL (ref 0.3–1.2)
Total Protein: 6.4 g/dL — ABNORMAL LOW (ref 6.5–8.1)

## 2020-10-18 LAB — LIPID PANEL
Cholesterol: 125 mg/dL (ref 0–200)
HDL: 35 mg/dL — ABNORMAL LOW (ref >40)
LDL Cholesterol: 65 mg/dL (ref 0–99)
Total CHOL/HDL Ratio: 3.6 RATIO
Triglycerides: 126 mg/dL (ref <150)
VLDL: 25 mg/dL (ref 0–40)

## 2020-10-18 LAB — ECHOCARDIOGRAM COMPLETE
Area-P 1/2: 3.36 cm2
Calc EF: 30.8 %
MV M vel: 5.71 m/s
MV Peak grad: 130.4 mmHg
Radius: 0.45 cm
S' Lateral: 6 cm
Single Plane A2C EF: 28.9 %
Single Plane A4C EF: 32.1 %

## 2020-10-18 LAB — T4, FREE: Free T4: 0.85 ng/dL (ref 0.61–1.12)

## 2020-10-18 LAB — TSH: TSH: 7.191 u[IU]/mL — ABNORMAL HIGH (ref 0.350–4.500)

## 2020-10-18 MED ORDER — EPLERENONE 25 MG PO TABS: 25.0000 mg | ORAL_TABLET | Freq: Every day | ORAL | 3 refills | Status: DC

## 2020-10-18 MED ORDER — FUROSEMIDE 40 MG PO TABS: 40.0000 mg | ORAL_TABLET | Freq: Every day | ORAL | 3 refills | Status: DC

## 2020-10-18 NOTE — Patient Instructions (Addendum)
Labs done today. We will contact you only if your labs are abnormal.  START Eplerenone 25mg  (1 tablet) by mouth daily. (This was not sent into the pharmacy yet per your request)   INCREASE Lasix to 40mg  (1 tablet) by mouth daily.  No other medication changes were made. Please continue all current medications as prescribed.  A standing order was provided to you today in office for a lipid panel.   Your physician recommends that you schedule a follow-up appointment in:10 days in [place] for a lab only appointment and in 3 months with Dr. Aundra Dubin virtually.   If you have any questions or concerns before your next appointment please send [place] a message through [place] or call our office at [phone].    TO LEAVE A MESSAGE FOR THE NURSE SELECT OPTION 2, PLEASE LEAVE A MESSAGE INCLUDING: YOUR NAME DATE OF BIRTH CALL BACK NUMBER REASON FOR CALL**this is important as we prioritize the call backs  YOU WILL RECEIVE A CALL BACK THE SAME DAY AS LONG AS YOU CALL BEFORE 4:00 PM   Do the following things EVERYDAY: Weigh yourself in the morning before breakfast. Write it down and keep it in a log. Take your medicines as prescribed Eat low salt foods--Limit salt (sodium) to 2000 mg per day.  Stay as active as you can everyday Limit all fluids for the day to less than 2 liters   At the [facility], you and your health needs are our priority. As part of our continuing mission to provide you with exceptional heart care, we have created designated Provider Care Teams. These Care Teams include your primary Cardiologist (physician) and Advanced Practice Providers (APPs- Physician Assistants and Nurse Practitioners) who all work together to provide you with the care you need, when you need it.   You may see any of the following providers on your designated Care Team at your next follow up: Dr Glori Bickers Dr Haynes Kerns, NP Lyda Jester, [place] Audry Riles,  PharmD   Please be sure to bring in all your medications bottles to every appointment.

## 2020-10-18 NOTE — Progress Notes (Signed)
Echocardiogram 2D Echocardiogram has been performed.  Oneal Deputy Robyn Galati 10/18/2020, 8:37 AM

## 2020-10-18 NOTE — Progress Notes (Signed)
Date: 10/18/2020  ID:  Nathan Pruitt, DOB March 30, 1946, MRN [id number]   Provider location: [facility] Type of Visit: Established patient   PCP:  Ronita Hipps, MD  Cardiologist:  Dr. Aundra Dubin   History of Present Illness: Nathan Pruitt is a 75 y.o.with a history of CAD, PAD, ischemic CMP, and VT s/p ICD.   Echo in 2/17 showed EF 40% and regional WMAs.   In 3/18, he developed episodes of VT as well as increased dyspnea.  LHC/RHC was done, showing new occlusion of a large ramus (CTO, probably a month or so old at time of cath) and old CTO RCA with collaterals.  He had DES x 3 to ramus.  Filling pressures were optimized on RHC.  Amiodarone was increased to 200 mg daily from 100 mg daily.  No VT since PCI.    Echo in 6/19 showed EF 35-40% with wall motion abnormalities. Echo in 1/21 showed EF 40-45% with basal to mid inferolateral and inferior akinesis, mild MR, normal RV, PASP 35 mmHg.   LHC/RHC in 9/21 with normal filling pressures and cardiac output but 95% OM1 stenosis.  This was treated with DES to OM1.   He has been following with Dr. Vaughan Browner with idiopathic pulmonary fibrosis.  I was concerned that he could have amiodarone -related lung toxicity but Dr. Vaughan Browner did not think this was very likely.  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     Labs drawn in 1/22 showed K up to 5.9 and creatinine 4.  He was sent to the ER at [facility].  He was found to have COVID-19 and was admitted. He had a productive cough and sore throat.  He was thought to be dehydrated and got IV fluid.  Creatinine 2.3 at discharge.  Lasix, valsartan, and eplerenone were stopped.  Other meds were continued.  Echo was done today and reviewed, EF 30-35%, basal-mid inferior and inferolateral akinesis, anterolateral hypokinesis, moderate MR (appears infarct-related), mildly decreased RV function.   He returns for followup of CHF.  Using oxygen when he goes  shopping.  Breathing is about the same, stable dyspnea with moderate exertion.  No chest pain.  No orthopnea/PND.  No lightheadedness.  He is now being seen by nephrology. Weight is up 5 lbs.   MDT device interrogation: Fluid index > threshold, no AF   Labs (11/10): K 4.9, creatinine 1.4, LDL 70, HDL 31, LFTs normal Labs (3/11): K 4.4, creatinine 1.3, LDL 61, HDL 35 Labs (7/11): LDL 62, HDL 23 Labs (1/12): K 4.8, LFTs normal, creatinine 1.44, LDL 69, HDL 33 Labs (7/12): LFTs normal, LDL 81, HDL 36, HCT normal, K 4.3, creatinine 1.3, BNP 116, TSH normal Labs (1/13): LDL 39, HDl 31, LFTs normal, TSH  Labs (7/13): TSH normal, LFTs normal, LDL 69, HDL 40 Labs (8/13): K 4.5, creatinine 1.6 Labs (10/13): K 4.4, creatinine 1.4 Labs (3/14): LDL 108, HDL 30, LFTs normal Labs (9/14): K 4.2, creatinine 1.6, LFTs normal, LDL 114, TSH 10.6 (elevated), free T4/free T3 normal Labs (2/15): K 4.6, creatinine 1.5, LFTs normal, TSH 7.6 (elevated), free T4 and free T3 normal Labs (3/15): K 3.6, creatinine 1.5, LFTs normal, TSH normal Labs (9/15): K 4.3, creatinine 1.6, LFTs normal, TSH normal, LDL 115, HDL 24 Labs (10/16): K 4.5, creatinine 1.5, LDL 90, HDL 35, hgb 12.8 Labs (11/16): Low free T3, normal TSH and free T4 Labs (12/16): K 5, creatinine 1.8,  LFTs normal, TSH 8.6 (mildly elevated) Labs (1/17): Free T4/TSH/free T3 normal, LFTs normal Labs (2/17): K 4.4, creatinine 2.4, LDL 131, HDL 34 Labs (4/17): K 4.2, creatinine 1.3, LDL 67, HDL 35 Labs (9/17): K 4.9, creatinine 2.2, LDL 54, HDL 35, free T3 and T4 normal, hgb 12 Labs (4/18): K 5, creatinine 1.78, LFTs normal, TSH elevated but free T3 and free T4 normal.  Labs (6/18): LDL 52, HDL 35, TSH elevated 6.8, LFTs normal Labs (7/18): K 4.3, creatinine 1.68 Labs (10/18): TSH 5.6, free T3 normal, free T4 normal, LFTs normal, K 4.9, creatinine 1.4 Labs (11/18): K 4.5, creatinine 1.46, LFTs normal Labs (4/19): K 4.8, creatinine 1.6, LFTs normal Labs  (6/19): TSH slightly elevated, free T3/T4 both normal, LFTs normal Labs (7/19): K 4.3, creatinine 1.3 Labs (10/19): LFTs normal, LDL 58, TGs 165, TSH 6.6 but free T4 and free T3 normal Labs (1/20): K 4.7, creatinine 1.7 Labs (2/20): LFTs normal Labs (4/20): K 4.6, creatinine 1.4, LDL 77, HDL 36, hgb 12.2, TSH mild elevation 7.4, free T4 and free T3 normal.  Labs (7/20): K 3.8, creatinine 1.6, hgb 12.2, LDL 81, TGs 158, HDL 35, TSH 7.7 (mildly elevated), LFTs normal Labs (11/20): K 4.5, creatinine 1.7, NT-proBNP 1358, TSH 5.6 (mild elevation), LDL 88, LFTs normal Labs (4/21): K 4.3, creatinine 1.6, hgb 12.4, LFTs, normal, TGs 105, LDL 81, HDL 40, TSH 5.6 (mildly elevated), free T3 and free T4 normal Labs (6/21): K 4.7, creatinine 1.9, AST/ALT normal, TSH mildly elevated at 7.9, free T3 and T4 normal, LDL 87, TGs 118 Labs (10/21): K 4.4, creatinine 1.8, LFTs normal, LDL 89, pro-BNP 1830, LFTs normal Labs (11/21): ESR 48, K 4.6, creatinine 1.96 Labs (12/21): ANA negative Labs (1/22): K 5.9, creatinine 4 => 2.3 Labs (2/22): K 4.6, creatinine 2.1 => 2.2, LDL 77, LFTs normal Labs (4/22): K 4.7,creatinine 2.7, TSH mildly elevated but free T3 and free T4 normal, hgb 10.2, LDL 67, LFTs normal Labs (5/22): creatinine 2.0 Labs (6/22): K 4.7, creatinine 2.4   Allergies (verified):  No Known Drug Allergies   Past Medical History: 1. Coronary artery disease. The patient reports history of silent MI in 1993.  This was likely an inferior MI (see below).  - The patient presented to [facility] in 5/10 with VT and mildly elevated cardiac enzymes. LHC (5/10):  Inferobasal dyskinesis with EF 35-40%.  There was chronic total occlusion of the mid RCA with good collaterals.  Luminals LCA.  This did not appear to be an acute cause of the 5/10 event.  - LHC (3/18): Known occlusion of RCA with collaterals, new occlusion of ramus.  DES x 3 to ramus.  - LHC (9/21): Known occlusion RCA with collaterals, 95% OMI treated  with DES.  2. Hypertension.  3. Hyperlipidemia: Intolerant of higher doses of simvastatin, Crestor, Lipitor, pravastatin. Zetia caused constipation.  4. Remote tobacco abuse with 47-pack-year history, quitting in August 2009.  5. Peripheral arterial disease.  - Status post left-to-right fem-fem bypass performed in [place] in March 2009.  - Status post redo left-to-right fem-fem bypass performed by Dr. Scot Dock at [facility] in 2009.  - ABIs normal 3/15, ABIs normal 3/16, ABIs normal 3/17, ABIs normal 4/18, ABIs normal 4/19, ABIs normal 12/20.  6. Rheumatoid arthritis, on leflunomide.  7.  Ischemic cardiomyopathy:  EF 35-40% by LV-gram 5/10 with inferobasal dyskinesis.  Echo 5/10 showed EF 40% with mild LVH, no significant MR, inferobasal and posterobasal akinesis.  Echo (7/11): EF 50%, mild  LVH, basal-mid inferoposterior akinesis.  Echo (7/12): EF 45-50% with basal anterolateral, basal posterior, and basal to mid inferior akinesis.  Echo (4/16): EF 40-45%, basal to mid inferolateral AK, basal inferior AK, basal to mid anterolateral HK.  Echo (2/17): EF 40% with basal to mid inferior akinesis, basal inferolateral aneurysm, mid inferolateral akinesis, basal anterolateral hypokinesis, normal RV size and systolic function, PASP 25 mmHg.  - Echo (3/18) with EF 30-35%, moderate LV dilation, moderate MR, mildly decreased RV systolic function, PASP 51 mmmHg. - RHC (3/18): mean RA 4, PA 38/12, mean PCWP 17, CI 2.2.  - Echo (6/19): EF 35-40%, inferior/inferolateral/anterolateral WMAs, moderate MR likely infarct-related, normal RV size with mildly decreased systolic function.  - Echo (1/21): EF 35-40% with wall motion abnormalities. Echo was done today and reviewed, EF 40-45% with basal to mid inferolateral and inferior akinesis, mild MR, normal RV, PASP 35 mmHg.  - RHC (9/21): mean RA 4, PA 33/12, mean PCWP 9, CI 3.12 - Echo (6/22): EF 30-35%, basal-mid inferior and inferolateral akinesis, anterolateral  hypokinesis, moderate MR (appears infarct-related), mildly decreased RV function.  8.  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  He has a dual chamber Medtronic ICD.  9.  Atrial flutter:  Status post isthmus ablation 5/10.   10. Restrictive lung defect: PFTs (7/11): FVC 74%, FEV1 80%, ratio 75%, TLC 78%, DLCO 68%.  This suggests a mild restrictive defect and a mild obstructive defect.  He did have response to bronchodilator. These PFTs were significantly better than the report from Dr. Alcide Clever in [place] done prior.  He had last PFTs in 2/12 with no significant change.  - PFTs (9/21) with significantly decreased DLCO and concern for ILD/pulmonary fibrosis.  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  11.  Anxiety 12.  Chronic cough: No relief with change from ACEI to ARB or with trial of PPI.  13.  CKD: Stage 3.  14. Mitral regurgitation: Moderate on 6/22 echo, suspect infarct-related.  15. AAA: 3.1 cm AAA on abdominal US 12/20  - Abdominal US 5/22 with 3.1 cm AAA 16. COVID-19 infection: 1/22.   Current Outpatient Medications  Medication Sig Dispense Refill   amiodarone (PACERONE) 200 MG tablet Take 0.5 tablets (100 mg total) by mouth daily. 90 tablet 3   aspirin EC 81 MG tablet Take 1 tablet (81 mg total) by mouth daily.     carvedilol (COREG) 12.5 MG tablet Take 1 tablet (12.5 mg total) by mouth 2 (two) times daily with a meal. 90 tablet 3   chlorhexidine (PERIDEX) 0.12 % solution Use as directed 15 mLs in the mouth or throat daily as needed (irritation).   3   clopidogrel (PLAVIX) 75 MG tablet TAKE 1 TABLET (75 MG TOTAL) BY MOUTH DAILY WITH BREAKFAST. 90 tablet 2   eplerenone (INSPRA) 25 MG tablet Take 1 tablet (25 mg total) by mouth daily. 90 tablet 3   fluticasone (FLONASE) 50 MCG/ACT nasal spray Place 1-2 sprays into both nostrils daily as needed for allergies or rhinitis.     leflunomide (ARAVA) 20 MG  tablet Take 20 mg by mouth daily.     Melatonin 10 MG TABS Take 10 mg by mouth at bedtime as needed (sleep).      simvastatin (ZOCOR) 20 MG tablet TAKE 1 TABLET BY MOUTH EVERYDAY AT BEDTIME 90 tablet 3   furosemide (LASIX) 40 MG tablet Take 1 tablet (40 mg total) by mouth daily.  90 tablet 3   No current facility-administered medications for this encounter.    Allergies:   Atorvastatin, Crestor [rosuvastatin calcium], Enalapril, Lisinopril, Losartan, and Telmisartan   Social History:  The patient  reports that he quit smoking about 13 years ago. He has a 47.00 pack-year smoking history. He has never used smokeless tobacco. He reports current alcohol use. He reports that he does not use drugs.   Family History:  The patient's family history includes Gastric cancer in his mother; Heart attack (age of onset: 10) in his brother; Peripheral vascular disease in his father; Throat cancer in his father.   ROS:  Please see the history of present illness.   All other systems are personally reviewed and negative.   Exam:   BP 124/78   Pulse (!) 55   Wt 70.6 kg (155 lb 9.6 oz)   SpO2 97%   BMI 22.33 kg/m  General: NAD Neck: No JVD, no thyromegaly or thyroid nodule.  Lungs: Clear to auscultation bilaterally with normal respiratory effort. CV: Nondisplaced PMI.  Heart regular S1/S2, no S3/S4, 1/6 HSM apex.  No peripheral edema.  No carotid bruit.  Normal pedal pulses.  Abdomen: Soft, nontender, no hepatosplenomegaly, no distention.  Skin: Intact without lesions or rashes.  Neurologic: Alert and oriented x 3.  Psych: Normal affect. Extremities: No clubbing or cyanosis.  HEENT: Normal.   Recent Labs: 01/28/2020: Hemoglobin 10.5; Hemoglobin 10.2; Platelets 287 03/15/2020: Magnesium 2.2 10/18/2020: ALT 12; BUN 20; Creatinine, Ser 2.21; Potassium 4.7; Sodium 136; TSH 7.191  Personally reviewed   Wt Readings from Last 3 Encounters:  10/18/20 70.6 kg (155 lb 9.6 oz)  09/29/20 66.7 kg (147 lb)   09/19/20 68.1 kg (150 lb 3.2 oz)     ASSESSMENT AND PLAN:  1. Chronic systolic CHF: Ischemic cardiomyopathy.  Medtronic ICD.  Echo in 6/22 with EF 30-35%, mild RV dysfunction. NYHA class III symptoms, stable.  Weight is up.  He is volume overloaded by Optivol.    - Increase Lasix to 40 mg daily.  - Start back on eplerenon 25 mg daily.  BMET today and in 10 days.  - He will stay off valsartan for now with elevated creatinine, may be able to start back on low dose in the future.   - Continue Coreg 12.5 mg bid.   - He did not tolerate Jardiance.  Wilder Glade is too expensive for him.  - We discussed getting more exercise.  2. CAD: Denies chest pain.  Had DES x 3 to ramus in 3/18 and DES to OM1 in 9/21.   - Continue ASA 81 mg daily, clopidogrel, Zocor 20 mg daily.   3. Hyperlipidemia: Stable on simvastatin without myalgias.  Has not been able to tolerate higher dose of simvastatin or other statins. Unable to tolerate Zetia due to constipation.  Recent LDL was 67 which is ok.   4. Hx of VT: Has been on amiodarone 100 mg daily. Has ICD.  He did not tolerate sotalol.  - Subclinical hypothyroidism (has had mildly elevated TSH with normal free T3 and free T4).   - Check LFTs today.   - Knows to get a yearly eye exam - See below regarding ?amiodarone lung toxicity.  5. PAD: He denies claudication.  Normal ABIs in 12/20, followed by [facility]. 6. AAA: 3.1 cm in 5/22, followed by [facility].  7. CKD: Stage 3.  BMET today.  - Follows with nephrology now.   8. Pulmonary: PFTs were done with increased dyspnea and  amiodorone use, concerning for ILD/pulmonary fibrosis.  High resolution CT chest was concerning for ILD.  I have worried about amiodarone-induced lung toxicity. 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  He was offered antifibrotic treatment but wants to hold off for now. I think that some of his symptomatology is due to IPF.  His oxygen saturation was only 91% on  RA today at rest, suspect this is reflective of IPF rather than pulmonary edema (he is not volume overloaded on exam).  - Can continue amidorone 100 mg daily as above.    - Will need pulmonary followup.   Followup in 3 months.   Signed, Loralie Champagne, MD  10/18/2020  [place] [address] [facility] [place] [address] [phone] (office) [phone] (fax)

## 2020-10-19 ENCOUNTER — Encounter (HOSPITAL_COMMUNITY): Payer: Self-pay

## 2020-10-19 LAB — T3, FREE: T3, Free: 2.2 pg/mL (ref 2.0–4.4)

## 2020-10-20 ENCOUNTER — Telehealth: Payer: Self-pay | Admitting: Pulmonary Disease

## 2020-10-24 NOTE — Telephone Encounter (Signed)
Nothing noted in message. Will close encounter.  

## 2020-10-27 ENCOUNTER — Telehealth (HOSPITAL_COMMUNITY): Payer: Self-pay | Admitting: Pharmacy Technician

## 2020-10-27 ENCOUNTER — Other Ambulatory Visit (HOSPITAL_COMMUNITY): Payer: Self-pay | Admitting: Cardiology

## 2020-10-27 NOTE — Telephone Encounter (Signed)
Advanced Heart Failure Patient Advocate Encounter  Patient called in stating that he saw an image uploaded into his mychart that suggested to cut back Lasix to 20mg . This conflicts with the 40mg  daily that he and Dr. Aundra Dubin spoke about at his last visit.  I Public relations account executive (RN) to confirm that the patient should be on 40mg  daily. She confirmed that is correct.  Called and updated the patient. He is aware to continue 40mg  daily.  Charlann Boxer, CPhT

## 2020-10-30 ENCOUNTER — Encounter (HOSPITAL_COMMUNITY): Payer: Self-pay

## 2020-11-01 ENCOUNTER — Other Ambulatory Visit (HOSPITAL_COMMUNITY): Payer: Self-pay | Admitting: Cardiology

## 2020-11-14 ENCOUNTER — Telehealth: Payer: Self-pay

## 2020-11-14 NOTE — Telephone Encounter (Signed)
Patient called in stating his monitor is not working and we have called tech support and they tried troubleshooting and it did not work and they are sending him a new hand held which will take 7-10 business days. Patient will call when he receives this monitor in the mail

## 2020-11-16 ENCOUNTER — Telehealth: Payer: Self-pay

## 2020-11-16 ENCOUNTER — Ambulatory Visit (INDEPENDENT_AMBULATORY_CARE_PROVIDER_SITE_OTHER): Payer: Medicare Other

## 2020-11-16 DIAGNOSIS — I472 Ventricular tachycardia, unspecified: Secondary | ICD-10-CM

## 2020-11-16 LAB — CUP PACEART REMOTE DEVICE CHECK
Battery Remaining Longevity: 74 mo
Battery Voltage: 2.97 V
Brady Statistic AP VP Percent: 0.26 %
Brady Statistic AP VS Percent: 0 %
Brady Statistic AS VP Percent: 56.98 %
Brady Statistic AS VS Percent: 42.76 %
Brady Statistic RA Percent Paced: 0.26 %
Brady Statistic RV Percent Paced: 56.8 %
Date Time Interrogation Session: 20220714130247
HighPow Impedance: 39 Ohm
HighPow Impedance: 55 Ohm
Implantable Lead Implant Date: 20100520
Implantable Lead Implant Date: 20100520
Implantable Lead Location: 753859
Implantable Lead Location: 753860
Implantable Lead Model: 5076
Implantable Lead Model: 6947
Implantable Pulse Generator Implant Date: 20180710
Lead Channel Impedance Value: 266 Ohm
Lead Channel Impedance Value: 323 Ohm
Lead Channel Impedance Value: 437 Ohm
Lead Channel Pacing Threshold Amplitude: 0.625 V
Lead Channel Pacing Threshold Amplitude: 1.5 V
Lead Channel Pacing Threshold Pulse Width: 0.4 ms
Lead Channel Pacing Threshold Pulse Width: 0.4 ms
Lead Channel Sensing Intrinsic Amplitude: 2.125 mV
Lead Channel Sensing Intrinsic Amplitude: 2.125 mV
Lead Channel Sensing Intrinsic Amplitude: 7.5 mV
Lead Channel Sensing Intrinsic Amplitude: 7.5 mV
Lead Channel Setting Pacing Amplitude: 1.5 V
Lead Channel Setting Pacing Amplitude: 3 V
Lead Channel Setting Pacing Pulse Width: 0.4 ms
Lead Channel Setting Sensing Sensitivity: 0.3 mV

## 2020-11-16 NOTE — Telephone Encounter (Signed)
Patient called in asking [place] not to bill for 11/15/2020 as he will send a manual transmission when he receives his new monitor.

## 2020-11-25 ENCOUNTER — Encounter: Payer: Self-pay | Admitting: Gastroenterology

## 2020-12-09 NOTE — Progress Notes (Signed)
Remote ICD transmission.   

## 2020-12-30 ENCOUNTER — Other Ambulatory Visit (HOSPITAL_COMMUNITY): Payer: Self-pay | Admitting: Cardiology

## 2020-12-31 ENCOUNTER — Other Ambulatory Visit (HOSPITAL_COMMUNITY): Payer: Self-pay | Admitting: Cardiology

## 2021-01-01 ENCOUNTER — Other Ambulatory Visit (HOSPITAL_COMMUNITY): Payer: Self-pay | Admitting: *Deleted

## 2021-01-01 ENCOUNTER — Encounter (HOSPITAL_COMMUNITY): Payer: Self-pay

## 2021-01-01 MED ORDER — CARVEDILOL 12.5 MG PO TABS: 12.5000 mg | ORAL_TABLET | Freq: Two times a day (BID) | ORAL | 3 refills | Status: DC

## 2021-01-01 MED ORDER — EPLERENONE 25 MG PO TABS: 25.0000 mg | ORAL_TABLET | Freq: Every day | ORAL | 3 refills | Status: DC

## 2021-01-05 ENCOUNTER — Telehealth: Payer: Self-pay | Admitting: Pulmonary Disease

## 2021-01-05 DIAGNOSIS — J849 Interstitial pulmonary disease, unspecified: Secondary | ICD-10-CM

## 2021-01-05 NOTE — Telephone Encounter (Signed)
Dr. Vaughan Browner, please advise if you would be okay with this being done now for pt.

## 2021-01-11 NOTE — Telephone Encounter (Signed)
Yes. It is fine to reschedule the CT scan to late Dec 2022

## 2021-01-11 NOTE — Telephone Encounter (Signed)
I have ordered HRCT to be done in Dec 2022 at [facility] is aware Nothing further needed

## 2021-01-15 ENCOUNTER — Encounter (HOSPITAL_COMMUNITY): Payer: Self-pay

## 2021-01-18 ENCOUNTER — Ambulatory Visit (HOSPITAL_COMMUNITY)
Admission: RE | Admit: 2021-01-18 | Discharge: 2021-01-18 | Disposition: A | Discharge status: Home / Self Care | Payer: Medicare Other | Source: Ambulatory Visit | Attending: Cardiology | Admitting: Cardiology

## 2021-01-18 ENCOUNTER — Other Ambulatory Visit: Payer: Self-pay

## 2021-01-18 VITALS — BP 111/70 | HR 54 | Wt 141.6 lb

## 2021-01-18 DIAGNOSIS — I5022 Chronic systolic (congestive) heart failure: Secondary | ICD-10-CM

## 2021-01-18 MED ORDER — LEVOTHYROXINE SODIUM 25 MCG PO TABS: 25.0000 ug | ORAL_TABLET | Freq: Every day | ORAL | 5 refills | Status: DC

## 2021-01-18 NOTE — Progress Notes (Signed)
Heart Failure TeleHealth Note  Due to national recommendations of social distancing due to [place] 19, Audio/video telehealth visit is felt to be most appropriate for this patient at this time.  See MyChart message from today for patient consent regarding telehealth for [facility].  Date:  01/18/2021   ID:  Nathan Pruitt, DOB 1945-07-14, MRN [id number]  Location: Home  Provider location: [place] [facility] Type of Visit: Established patient  PCP:  Ronita Hipps, MD  Cardiologist:  Dr. Aundra Dubin   History of Present Illness: Nathan Pruitt is a 75 y.o. male who presents via audio/video conferencing for a telehealth visit today.     he denies symptoms worrisome for COVID 19.   Patient has a history of CAD, PAD, ischemic CMP, and VT s/p ICD.   Echo in 2/17 showed EF 40% and regional WMAs.   In 3/18, he developed episodes of VT as well as increased dyspnea.  LHC/RHC was done, showing new occlusion of a large ramus (CTO, probably a month or so old at time of cath) and old CTO RCA with collaterals.  He had DES x 3 to ramus.  Filling pressures were optimized on RHC.  Amiodarone was increased to 200 mg daily from 100 mg daily.  No VT since PCI.    Echo in 6/19 showed EF 35-40% with wall motion abnormalities. Echo in 1/21 showed EF 40-45% with basal to mid inferolateral and inferior akinesis, mild MR, normal RV, PASP 35 mmHg.   LHC/RHC in 9/21 with normal filling pressures and cardiac output but 95% OM1 stenosis.  This was treated with DES to OM1.   He has been following with Dr. Vaughan Browner with idiopathic pulmonary fibrosis.  I was concerned that he could have amiodarone -related lung toxicity but Dr. Vaughan Browner did not think this was very likely.  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     Labs drawn in 1/22 showed K up to 5.9 and creatinine 4.  He was sent to the ER at [facility].  He was found to have COVID-19 and was admitted. He had a  productive cough and sore throat.  He was thought to be dehydrated and got IV fluid.  Creatinine 2.3 at discharge.  Lasix, valsartan, and eplerenone were stopped.  Other meds were continued.  Echo in 6/22 showed EF 30-35%, basal-mid inferior and inferolateral akinesis, anterolateral hypokinesis, moderate MR (appears infarct-related), mildly decreased RV function.   He returns for followup of CHF.  Using oxygen when he goes shopping.  Breathing is unchanged, stable dyspnea with moderate exertion. No chest pain. Main complaint currently is joint pain from arthritis.  Creatinine still elevated at 2.6 when checked recently.  Weight has been trending down.  He is now being seen by nephrology.    Labs (11/10): K 4.9, creatinine 1.4, LDL 70, HDL 31, LFTs normal Labs (3/11): K 4.4, creatinine 1.3, LDL 61, HDL 35 Labs (7/11): LDL 62, HDL 23 Labs (1/12): K 4.8, LFTs normal, creatinine 1.44, LDL 69, HDL 33 Labs (7/12): LFTs normal, LDL 81, HDL 36, HCT normal, K 4.3, creatinine 1.3, BNP 116, TSH normal Labs (1/13): LDL 39, HDl 31, LFTs normal, TSH  Labs (7/13): TSH normal, LFTs normal, LDL 69, HDL 40 Labs (8/13): K 4.5, creatinine 1.6 Labs (10/13): K 4.4, creatinine 1.4 Labs (3/14): LDL 108, HDL 30, LFTs normal Labs (9/14): K 4.2, creatinine 1.6, LFTs normal, LDL 114, TSH 10.6 (elevated), free  T4/free T3 normal Labs (2/15): K 4.6, creatinine 1.5, LFTs normal, TSH 7.6 (elevated), free T4 and free T3 normal Labs (3/15): K 3.6, creatinine 1.5, LFTs normal, TSH normal Labs (9/15): K 4.3, creatinine 1.6, LFTs normal, TSH normal, LDL 115, HDL 24 Labs (10/16): K 4.5, creatinine 1.5, LDL 90, HDL 35, hgb 12.8 Labs (11/16): Low free T3, normal TSH and free T4 Labs (12/16): K 5, creatinine 1.8, LFTs normal, TSH 8.6 (mildly elevated) Labs (1/17): Free T4/TSH/free T3 normal, LFTs normal Labs (2/17): K 4.4, creatinine 2.4, LDL 131, HDL 34 Labs (4/17): K 4.2, creatinine 1.3, LDL 67, HDL 35 Labs (9/17): K 4.9,  creatinine 2.2, LDL 54, HDL 35, free T3 and T4 normal, hgb 12 Labs (4/18): K 5, creatinine 1.78, LFTs normal, TSH elevated but free T3 and free T4 normal.  Labs (6/18): LDL 52, HDL 35, TSH elevated 6.8, LFTs normal Labs (7/18): K 4.3, creatinine 1.68 Labs (10/18): TSH 5.6, free T3 normal, free T4 normal, LFTs normal, K 4.9, creatinine 1.4 Labs (11/18): K 4.5, creatinine 1.46, LFTs normal Labs (4/19): K 4.8, creatinine 1.6, LFTs normal Labs (6/19): TSH slightly elevated, free T3/T4 both normal, LFTs normal Labs (7/19): K 4.3, creatinine 1.3 Labs (10/19): LFTs normal, LDL 58, TGs 165, TSH 6.6 but free T4 and free T3 normal Labs (1/20): K 4.7, creatinine 1.7 Labs (2/20): LFTs normal Labs (4/20): K 4.6, creatinine 1.4, LDL 77, HDL 36, hgb 12.2, TSH mild elevation 7.4, free T4 and free T3 normal.  Labs (7/20): K 3.8, creatinine 1.6, hgb 12.2, LDL 81, TGs 158, HDL 35, TSH 7.7 (mildly elevated), LFTs normal Labs (11/20): K 4.5, creatinine 1.7, NT-proBNP 1358, TSH 5.6 (mild elevation), LDL 88, LFTs normal Labs (4/21): K 4.3, creatinine 1.6, hgb 12.4, LFTs, normal, TGs 105, LDL 81, HDL 40, TSH 5.6 (mildly elevated), free T3 and free T4 normal Labs (6/21): K 4.7, creatinine 1.9, AST/ALT normal, TSH mildly elevated at 7.9, free T3 and T4 normal, LDL 87, TGs 118 Labs (10/21): K 4.4, creatinine 1.8, LFTs normal, LDL 89, pro-BNP 1830, LFTs normal Labs (11/21): ESR 48, K 4.6, creatinine 1.96 Labs (12/21): ANA negative Labs (1/22): K 5.9, creatinine 4 => 2.3 Labs (2/22): K 4.6, creatinine 2.1 => 2.2, LDL 77, LFTs normal Labs (4/22): K 4.7,creatinine 2.7, TSH mildly elevated but free T3 and free T4 normal, hgb 10.2, LDL 67, LFTs normal Labs (5/22): creatinine 2.0 Labs (6/22): K 4.7, creatinine 2.4 Labs (9/22): K 4.4, creatinine 2.6, LFTs normal, hgb 9.9, LDL 62, TSH 5.94, free T4 normal, free T3 low.    Allergies (verified):  No Known Drug Allergies   Past Medical History: 1. Coronary artery disease.  The patient reports history of silent MI in 1993.  This was likely an inferior MI (see below).  - The patient presented to [facility] in [facility]10 with VT and mildly elevated cardiac enzymes. LHC (5/10):  Inferobasal dyskinesis with EF 35-40%.  There was chronic total occlusion of the mid RCA with good collaterals.  Luminals LCA.  This did not appear to be an acute cause of the 5/10 event.  - LHC (3/18): Known occlusion of RCA with collaterals, new occlusion of ramus.  DES x 3 to ramus.  - LHC (9/21): Known occlusion RCA with collaterals, 95% OMI treated with DES.  2. Hypertension.  3. Hyperlipidemia: Intolerant of higher doses of simvastatin, Crestor, Lipitor, pravastatin. Zetia caused constipation.  4. Remote tobacco abuse with 47-pack-year history, quitting in August 2009.  5. Peripheral arterial  disease.  - Status post left-to-right fem-fem bypass performed in [place] in March 2009.  - Status post redo left-to-right fem-fem bypass performed by Dr. Scot Dock [facility] in 2009.  - ABIs normal 3/15, ABIs normal 3/16, ABIs normal 3/17, ABIs normal 4/18, ABIs normal 4/19, ABIs normal 12/20.  6. Rheumatoid arthritis, on leflunomide.  7.  Ischemic cardiomyopathy:  EF 35-40% by LV-gram 5/10 with inferobasal dyskinesis.  Echo 5/10 showed EF 40% with mild LVH, no significant MR, inferobasal and posterobasal akinesis.  Echo (7/11): EF 50%, mild LVH, basal-mid inferoposterior akinesis.  Echo (7/12): EF 45-50% with basal anterolateral, basal posterior, and basal to mid inferior akinesis.  Echo (4/16): EF 40-45%, basal to mid inferolateral AK, basal inferior AK, basal to mid anterolateral HK.  Echo (2/17): EF 40% with basal to mid inferior akinesis, basal inferolateral aneurysm, mid inferolateral akinesis, basal anterolateral hypokinesis, normal RV size and systolic function, PASP 25 mmHg.  - Echo (3/18) with EF 30-35%, moderate LV dilation, moderate MR, mildly decreased RV systolic function, PASP 51 mmmHg. - RHC  (3/18): mean RA 4, PA 38/12, mean PCWP 17, CI 2.2.  - Echo (6/19): EF 35-40%, inferior/inferolateral/anterolateral WMAs, moderate MR likely infarct-related, normal RV size with mildly decreased systolic function.  - Echo (1/21): EF 35-40% with wall motion abnormalities. Echo was done today and reviewed, EF 40-45% with basal to mid inferolateral and inferior akinesis, mild MR, normal RV, PASP 35 mmHg.  - RHC (9/21): mean RA 4, PA 33/12, mean PCWP 9, CI 3.12 - Echo (6/22): EF 30-35%, basal-mid inferior and inferolateral akinesis, anterolateral hypokinesis, moderate MR (appears infarct-related), mildly decreased RV function.  8.  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  He has a dual chamber Medtronic ICD.  9.  Atrial flutter:  Status post isthmus ablation 5/10.   10. Restrictive lung defect: PFTs (7/11): FVC 74%, FEV1 80%, ratio 75%, TLC 78%, DLCO 68%.  This suggests a mild restrictive defect and a mild obstructive defect.  He did have response to bronchodilator. These PFTs were significantly better than the report from Dr. Alcide Clever in [place] done prior.  He had last PFTs in 2/12 with no significant change.  - PFTs (9/21) with significantly decreased DLCO and concern for ILD/pulmonary fibrosis.  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  11.  Anxiety 12.  Chronic cough: No relief with change from ACEI to ARB or with trial of PPI.  13.  CKD: Stage 3.  14. Mitral regurgitation: Moderate on 6/22 echo, suspect infarct-related.  15. AAA: 3.1 cm AAA on abdominal US 12/20  - Abdominal US 5/22 with 3.1 cm AAA 16. COVID-19 infection: 1/22.   Current Outpatient Medications  Medication Sig Dispense Refill   amiodarone (PACERONE) 200 MG tablet Take 0.5 tablets (100 mg total) by mouth daily. 90 tablet 3   carvedilol (COREG) 12.5 MG tablet Take 1 tablet (12.5 mg total) by mouth 2 (two) times daily with a meal. 180 tablet 3    chlorhexidine (PERIDEX) 0.12 % solution Use as directed 15 mLs in the mouth or throat daily as needed (irritation).   3   clopidogrel (PLAVIX) 75 MG tablet TAKE 1 TABLET (75 MG TOTAL) BY MOUTH DAILY WITH BREAKFAST. 90 tablet 2   eplerenone (INSPRA) 25 MG tablet TAKE 1 TABLET BY MOUTH EVERY DAY 90 tablet 3   fluticasone (FLONASE) 50 MCG/ACT nasal spray Place 1-2 sprays into both nostrils daily as needed for  allergies or rhinitis.     furosemide (LASIX) 40 MG tablet Take 1 tablet (40 mg total) by mouth daily. 90 tablet 3   leflunomide (ARAVA) 20 MG tablet Take 20 mg by mouth daily.     levothyroxine (SYNTHROID) 25 MCG tablet Take 1 tablet (25 mcg total) by mouth daily before breakfast. 30 tablet 5   Melatonin 10 MG TABS Take 10 mg by mouth at bedtime as needed (sleep).      simvastatin (ZOCOR) 20 MG tablet TAKE 1 TABLET BY MOUTH EVERYDAY AT BEDTIME 90 tablet 3   No current facility-administered medications for this encounter.    Allergies:   Atorvastatin, Crestor [rosuvastatin calcium], Enalapril, Lisinopril, Losartan, and Telmisartan   Social History:  The patient  reports that he quit smoking about 13 years ago. He has a 47.00 pack-year smoking history. He has never used smokeless tobacco. He reports current alcohol use. He reports that he does not use drugs.   Family History:  The patient's family history includes Gastric cancer in his mother; Heart attack (age of onset: 69) in his brother; Peripheral vascular disease in his father; Throat cancer in his father.   ROS:  Please see the history of present illness.   All other systems are personally reviewed and negative.   Exam:   BP 111/70   Pulse (!) 54   Wt 64.2 kg (141 lb 9.6 oz)   BMI 20.32 kg/m  (Video/Tele Health Call; Exam is subjective and or/visual.) General:  Speaks in full sentences. No resp difficulty. Lungs: Normal respiratory effort with conversation.  Abdomen: Non-distended per patient report Extremities: Pt denies  edema. Neuro: Alert & oriented x 3.   Recent Labs: 01/28/2020: Hemoglobin 10.5; Hemoglobin 10.2; Platelets 287 03/15/2020: Magnesium 2.2 10/18/2020: ALT 12; BUN 20; Creatinine, Ser 2.21; Potassium 4.7; Sodium 136; TSH 7.191  Personally reviewed   Wt Readings from Last 3 Encounters:  01/18/21 64.2 kg (141 lb 9.6 oz)  10/18/20 70.6 kg (155 lb 9.6 oz)  09/29/20 66.7 kg (147 lb)     ASSESSMENT AND PLAN:  1. Chronic systolic CHF: Ischemic cardiomyopathy.  Medtronic ICD.  Echo in 6/22 with EF 30-35%, mild RV dysfunction. NYHA class III symptoms, stable.  Weight is down.  Most recent creatinine was 2.6.     - Continue Lasix 40 mg daily.   - Continue eplerenone 25 mg daily, will not increase with elevated creatinine.  - He will stay off valsartan for now with elevated creatinine, may be able to start back on low dose in the future.   - Continue Coreg 12.5 mg bid.   - He did not tolerate Jardiance.  Wilder Glade is too expensive for him, we have tried several times to find a way to get it for him to no avail.  2. CAD: Denies chest pain.  Had DES x 3 to ramus in 3/18 and DES to OM1 in 9/21.   - Good lipids on current Zocor.  - He can stop ASA today (1 year post-PCI) but will continue Plavix long-term.    3. Hyperlipidemia: Stable on simvastatin without myalgias.  Has not been able to tolerate higher dose of simvastatin or other statins. Unable to tolerate Zetia due to constipation.  Recent LDL was 62 which is ok.   4. Hx of VT: Has been on amiodarone 100 mg daily. Has ICD.  He did not tolerate sotalol.  - Most recent labs consistent with hypothyroidism, likely due to amiodarone.  I think that we will  need to continue amiodarone due to VT episodes.  I will start him on Levoxyl 25 mcg daily with TSH in 1 month.   - Check LFTs today.   - Knows to get a yearly eye exam - See below regarding ?amiodarone lung toxicity.  5. PAD: He denies claudication.  Normal ABIs in 12/20, followed by [facility]. 6. AAA: 3.1 cm  in 5/22, followed by [facility].  7. CKD: Stage 3.  Recent creatinine stable at 2.6.  - Follows with nephrology now.   8. Pulmonary: PFTs were done with increased dyspnea and amiodorone use, concerning for ILD/pulmonary fibrosis.  High resolution CT chest was concerning for ILD.  I have worried about amiodarone-induced lung toxicity. 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  He was offered antifibrotic treatment but wants to hold off for now. I think that some of his symptomatology is due to IPF.    - Can continue amidorone 100 mg daily as above.    - Will need pulmonary followup.   COVID screen The patient does not have any symptoms that suggest any further testing/ screening at this time.  Social distancing reinforced today.  Patient Risk: After full review of this patients clinical status, I feel that they are at moderate risk for cardiac decompensation at this time.  Relevant cardiac medications were reviewed at length with the patient today. The patient does not have concerns regarding their medications at this time.   Recommended follow-up:  3 months.   Today, I have spent 17 minutes with the patient with telehealth technology discussing the above issues .    Signed, Loralie Champagne, MD  01/18/2021   [facility] [address] [facility] [place] [address] [phone] (office) [phone] (fax)

## 2021-01-18 NOTE — Progress Notes (Signed)
Post video visit with MD, patient called. Reviewed AVS. AVS mailed with Rx for blood work and appt reminder card.

## 2021-01-18 NOTE — Patient Instructions (Signed)
STOP Aspirin  START Levothyroxine 49mg (1 tab) 30 minutes before breakfast  Labs in 1 months, Rx mailed We will only contact you if something comes back abnormal or we need to make some changes. Otherwise no news is good news!  Your physician recommends that you schedule a follow-up appointment in: 3 months with Dr MAundra Dubin Reminder card mailed  Please call office at [phone] if you have any questions or concerns.   [facility] you and your health needs are our priority. As part of our continuing mission to provide you with exceptional heart care, we have created designated Provider Care Teams. These Care Teams include your primary Cardiologist (physician) and Advanced Practice Providers (APPs- Physician Assistants and Nurse Practitioners) who all work together to provide you with the care you need, when you need it.   You may see any of the following providers on your designated Care Team at your next follow up: Dr DGlori BickersDr DLoralie ChampagneDr BPatrice Paradise NP BLyda Jester PUtahJGinnie SmartLAudry Riles PharmD   Please be sure to bring in all your medications bottles to every appointment.

## 2021-02-08 ENCOUNTER — Encounter (HOSPITAL_COMMUNITY): Payer: Self-pay

## 2021-02-12 ENCOUNTER — Telehealth (HOSPITAL_COMMUNITY): Payer: Self-pay | Admitting: *Deleted

## 2021-02-12 NOTE — Telephone Encounter (Signed)
Received fax from [facility], pt needs clearance for colonoscopy and to hold ASA and plavix prior.   Per Dr Aundra Dubin: ok to proceed and hold ASA and Plavix 5 days prior  Form faxed back to them at [phone]

## 2021-02-14 ENCOUNTER — Ambulatory Visit (INDEPENDENT_AMBULATORY_CARE_PROVIDER_SITE_OTHER): Payer: Medicare Other

## 2021-02-14 ENCOUNTER — Telehealth (INDEPENDENT_AMBULATORY_CARE_PROVIDER_SITE_OTHER): Payer: Medicare Other | Admitting: Internal Medicine

## 2021-02-14 VITALS — BP 111/66 | HR 61 | Ht 70.0 in | Wt 134.0 lb

## 2021-02-14 DIAGNOSIS — Z9861 Coronary angioplasty status: Secondary | ICD-10-CM

## 2021-02-14 DIAGNOSIS — I472 Ventricular tachycardia, unspecified: Secondary | ICD-10-CM | POA: Diagnosis not present

## 2021-02-14 DIAGNOSIS — I5022 Chronic systolic (congestive) heart failure: Secondary | ICD-10-CM | POA: Diagnosis not present

## 2021-02-14 DIAGNOSIS — I2510 Atherosclerotic heart disease of native coronary artery without angina pectoris: Secondary | ICD-10-CM | POA: Diagnosis not present

## 2021-02-14 NOTE — Progress Notes (Signed)
Electrophysiology TeleHealth Note   Due to national recommendations of social distancing due to COVID 19, an audio/video telehealth visit is felt to be most appropriate for this patient at this time.  See MyChart message from today for the patient's consent to telehealth for [facility].  Date:  02/14/2021   ID:  Nathan Pruitt, DOB [birth year], MRN [id number]  Location: patient's home  Provider location:  [place]  Evaluation Performed: Follow-up visit  PCP:  Ronita Hipps, MD   Electrophysiologist:  Dr Rayann Heman CHF:  Aundra Dubin Chief Complaint:  VT  History of Present Illness:    Nathan Pruitt is a 75 y.o. male who presents via telehealth conferencing today.  Since last being seen in our clinic, the patient reports doing reasonably well.  His primary concern is with arthritis.   He has lost a substantial amount of weight.  Today, he denies symptoms of palpitations, chest pain, shortness of breath,  lower extremity edema, dizziness, presyncope, or syncope.  The patient is otherwise without complaint today.   Past Medical History:  Diagnosis Date   Abnormal PFTs 7/11   FVC 74%, FEV1 80%, ratio 75%, TLC 78%, DLCO 68%. this suggests a mild restrictive and obstructive defect. pt did have a response to bronchodilator. these PFTs were significantly better than the report from Dr. Alcide Clever in [place] done prior.    AICD (automatic cardioverter/defibrillator) present    Anxiety    Atrial flutter (HCC)    s/p isthmus ablation 5/10   CAD (coronary artery disease)    hx of silent MI in 1993. likely an inferior MI. hx of 2D cardiogram in 3/09 showing EF of 40%. hx of myoview in [place] 3/09-nml. [facility] with VT and mildly elevated cardiac enzymes LHC (5/10): inferobasal dyskinesis with EF 35-40%. was chronic total occlusion of mid RCA with good collaterals. luminals LCA. this does not appear to be an acute cause of the 5/10 event   Cancer ([place])    skin   CHF (congestive  heart failure) (HCC)    Chronic lower back pain    HLD (hyperlipidemia)    HTN (hypertension)    Ischemic cardiomyopathy    EF 35-40% by LV-gram 5/10 with inferobasal dyskinesis. echo 5/10 showed EF 40% w/mild LVH, no sig. MR, inferobasal and posterobasal akinesis. 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 Scot Dock at [facility] in 2009    Rheumatoid arthritis [facility])    on leflunomide   Silent myocardial infarction [facility]) 1993   "silent"   Tobacco abuse    47 pack year hx; quit 8/09   Ventricular tachycardia ([place])    likely scar-mediated. 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 He has duel chamber Medtronic ICD    Past Surgical History:  Procedure Laterality Date   CARDIAC DEFIBRILLATOR PLACEMENT  09/22/2008   CATARACT EXTRACTION W/ INTRAOCULAR LENS  IMPLANT, BILATERAL Bilateral 05/2016 - 06/2016   CORONARY BALLOON ANGIOPLASTY N/A 01/28/2020   Procedure: CORONARY BALLOON ANGIOPLASTY;  Surgeon: Jettie Booze, MD;  Location: [place] CV LAB;  Service: Cardiovascular;  Laterality: N/A;   CORONARY CTO INTERVENTION  07/16/2016   CORONARY CTO INTERVENTION N/A 07/16/2016   Procedure: Coronary CTO Intervention;  Surgeon: Belva Crome, MD;  Location: [place] CV LAB;  Service: Cardiovascular;  Laterality: N/A;  EP IMPLANTABLE DEVICE  09/22/08   Medtronic, ICD Model Number:  D274DRG, ICD Serial Number: [id number]   FEMORAL ARTERY - FEMORAL ARTERY BYPASS GRAFT  march 2009   left to right bypass, first @ [facility], second at [facility] by Dr Scot Dock   ICD GENERATOR CHANGEOUT N/A 11/12/2016   Procedure: ICD Generator Changeout;  Surgeon: Thompson Grayer, MD;  Location: [place] CV LAB;  Service: Cardiovascular;  Laterality: N/A;   RIGHT/LEFT HEART CATH AND CORONARY ANGIOGRAPHY N/A 07/12/2016    Procedure: Right/Left Heart Cath and Coronary Angiography;  Surgeon: Larey Dresser, MD;  Location: [place] CV LAB;  Service: Cardiovascular;  Laterality: N/A;   RIGHT/LEFT HEART CATH AND CORONARY ANGIOGRAPHY N/A 01/28/2020   Procedure: RIGHT/LEFT HEART CATH AND CORONARY ANGIOGRAPHY;  Surgeon: Larey Dresser, MD;  Location: [place] CV LAB;  Service: Cardiovascular;  Laterality: N/A;   TONSILLECTOMY     VASECTOMY     VENTRICULAR ABLATION SURGERY  09/2008    Current Outpatient Medications  Medication Sig Dispense Refill   amiodarone (PACERONE) 200 MG tablet Take 0.5 tablets (100 mg total) by mouth daily. 90 tablet 3   carvedilol (COREG) 12.5 MG tablet Take 1 tablet (12.5 mg total) by mouth 2 (two) times daily with a meal. 180 tablet 3   chlorhexidine (PERIDEX) 0.12 % solution Use as directed 15 mLs in the mouth or throat daily as needed (irritation).   3   clopidogrel (PLAVIX) 75 MG tablet TAKE 1 TABLET (75 MG TOTAL) BY MOUTH DAILY WITH BREAKFAST. 90 tablet 2   eplerenone (INSPRA) 25 MG tablet TAKE 1 TABLET BY MOUTH EVERY DAY 90 tablet 3   fluticasone (FLONASE) 50 MCG/ACT nasal spray Place 1-2 sprays into both nostrils daily as needed for allergies or rhinitis.     furosemide (LASIX) 40 MG tablet Take 1 tablet (40 mg total) by mouth daily. 90 tablet 3   leflunomide (ARAVA) 20 MG tablet Take 20 mg by mouth daily.     levothyroxine (SYNTHROID) 25 MCG tablet Take 1 tablet (25 mcg total) by mouth daily before breakfast. 30 tablet 5   Melatonin 10 MG TABS Take 10 mg by mouth at bedtime as needed (sleep).      simvastatin (ZOCOR) 20 MG tablet TAKE 1 TABLET BY MOUTH EVERYDAY AT BEDTIME 90 tablet 3   No current facility-administered medications for this visit.    Allergies:   Atorvastatin, Crestor [rosuvastatin calcium], Enalapril, Lisinopril, Losartan, and Telmisartan   Social History:  The patient  reports that he quit smoking about 13 years ago. He has a 47.00 pack-year smoking history.  He has never used smokeless tobacco. He reports current alcohol use. He reports that he does not use drugs.   ROS:  Please see the history of present illness.   All other systems are personally reviewed and negative.    Exam:    Vital Signs:  BP 111/66   Pulse 61   Ht 5\' 10"  (1.778 m)   Wt 134 lb (60.8 kg)   BMI 19.23 kg/m   thin, alert and conversant, regular work of breathing,  good skin color Eyes- anicteric, neuro- grossly intact, skin- no apparent rash or lesions or cyanosis, mouth- oral mucosa is pink  Labs/Other Tests and Data Reviewed:    Recent Labs: 03/15/2020: Magnesium 2.2 10/18/2020: ALT 12; BUN 20; Creatinine, Ser 2.21; Potassium 4.7; Sodium 136; TSH 7.191   Wt Readings from Last 3 Encounters:  02/14/21 134 lb (60.8 kg)  01/18/21 141 lb 9.6 oz (64.2 kg)  10/18/20 155 lb 9.6 oz (70.6 kg)    Echo 10/18/20-  EF 30%  Last device remote is reviewed from [place] PDF which reveals normal device function, no arrhythmias   Last ekg showed sinus with long first degree AV block, RBBB  ASSESSMENT & PLAN:    1.  VT Continues to do well with amiodarone 100mg  daily He did not tolerate sotalol He has had some fibrotic changes and is followed by pulmonary. We really do not have any additional options for his arrhythmia. Labs are reviewed  2. Chronic systolic dysfunction/ CAD/ ischemic CM No ischemic symptoms Euvolemic Diuresis is limited by renal failure Uses O2 Could consider upgrade of his ICD to CRT given that he V paces now > 40%.  I am not convinced currently however that this would substantially improve his condition or overall quality of life.  3. HTN Stable No change required today    Risks, benefits and potential toxicities for medications prescribed and/or refilled reviewed with patient today.   Follow-up:  Could follow-up with Dr Curt Bears in [place] in a year.   Patient Risk:  after full review of this patients clinical status, I feel that they are  at moderate risk at this time.  Today, I have spent 15 minutes with the patient with telehealth technology discussing arrhythmia management .    Army Fossa, MD  02/14/2021 9:41 AM     [facility] [address] [place] [address] [phone] (office) [phone] (fax)

## 2021-02-15 LAB — CUP PACEART REMOTE DEVICE CHECK
Battery Remaining Longevity: 66 mo
Battery Voltage: 2.98 V
Brady Statistic AP VP Percent: 0.58 %
Brady Statistic AP VS Percent: 0.02 %
Brady Statistic AS VP Percent: 28.61 %
Brady Statistic AS VS Percent: 70.79 %
Brady Statistic RA Percent Paced: 0.6 %
Brady Statistic RV Percent Paced: 28.51 %
Date Time Interrogation Session: 20221012033523
HighPow Impedance: 34 Ohm
HighPow Impedance: 44 Ohm
Implantable Lead Implant Date: 20100520
Implantable Lead Implant Date: 20100520
Implantable Lead Location: 753859
Implantable Lead Location: 753860
Implantable Lead Model: 5076
Implantable Lead Model: 6947
Implantable Pulse Generator Implant Date: 20180710
Lead Channel Impedance Value: 209 Ohm
Lead Channel Impedance Value: 304 Ohm
Lead Channel Impedance Value: 380 Ohm
Lead Channel Pacing Threshold Amplitude: 0.5 V
Lead Channel Pacing Threshold Amplitude: 1.625 V
Lead Channel Pacing Threshold Pulse Width: 0.4 ms
Lead Channel Pacing Threshold Pulse Width: 0.4 ms
Lead Channel Sensing Intrinsic Amplitude: 0.625 mV
Lead Channel Sensing Intrinsic Amplitude: 0.625 mV
Lead Channel Sensing Intrinsic Amplitude: 6.625 mV
Lead Channel Sensing Intrinsic Amplitude: 6.625 mV
Lead Channel Setting Pacing Amplitude: 1.5 V
Lead Channel Setting Pacing Amplitude: 3.5 V
Lead Channel Setting Pacing Pulse Width: 0.4 ms
Lead Channel Setting Sensing Sensitivity: 0.3 mV

## 2021-02-23 NOTE — Progress Notes (Signed)
Remote ICD transmission.   

## 2021-03-08 ENCOUNTER — Other Ambulatory Visit (HOSPITAL_COMMUNITY): Payer: Self-pay | Admitting: Cardiology

## 2021-03-26 ENCOUNTER — Encounter (HOSPITAL_COMMUNITY): Payer: Self-pay | Admitting: Cardiology

## 2021-03-26 ENCOUNTER — Other Ambulatory Visit (HOSPITAL_COMMUNITY): Payer: Self-pay | Admitting: Cardiology

## 2021-03-26 NOTE — Telephone Encounter (Signed)
Patient called concerned about getting his medication refilled.  Patient says [facility] has canceled the cl[facility]rel refill.  Please contact patient if you have any questions.

## 2021-03-26 NOTE — Telephone Encounter (Signed)
Refill sent.

## 2021-04-01 ENCOUNTER — Other Ambulatory Visit (HOSPITAL_COMMUNITY): Payer: Self-pay | Admitting: Cardiology

## 2021-04-16 ENCOUNTER — Encounter: Payer: Self-pay | Admitting: *Deleted

## 2021-04-16 ENCOUNTER — Encounter: Payer: Self-pay | Admitting: Pulmonary Disease

## 2021-04-18 ENCOUNTER — Encounter: Payer: Self-pay | Admitting: Diagnostic Neuroimaging

## 2021-04-18 ENCOUNTER — Telehealth: Payer: Self-pay | Admitting: Diagnostic Neuroimaging

## 2021-04-18 ENCOUNTER — Ambulatory Visit: Payer: Medicare Other | Admitting: Diagnostic Neuroimaging

## 2021-04-18 VITALS — BP 115/62 | HR 54 | Ht 70.0 in | Wt 154.2 lb

## 2021-04-18 DIAGNOSIS — R200 Anesthesia of skin: Secondary | ICD-10-CM | POA: Diagnosis not present

## 2021-04-18 DIAGNOSIS — M79602 Pain in left arm: Secondary | ICD-10-CM | POA: Diagnosis not present

## 2021-04-18 NOTE — Patient Instructions (Addendum)
-   check MRI cervical spine and EMG nerve testing  - consider PT/OT eval  - patient will explore chiropractic eval

## 2021-04-18 NOTE — Telephone Encounter (Signed)
[employer] medicare no auth req. Faxed the order to [place]. Patient has a pacemaker info to [place] they will reach out to the patient to schedule.

## 2021-04-18 NOTE — Progress Notes (Signed)
[facility]  PATIENT: Nathan Pruitt DOB: 1946/01/04  REFERRING CLINICIAN: Ronita Hipps, MD HISTORY FROM: patient  REASON FOR VISIT: new consult    HISTORICAL  CHIEF COMPLAINT:  Chief Complaint  Patient presents with   Cervical radiculopathy    Rm 7 New Pt  "right arm pain, numbness; left hand/fingers tingle, constant left forearm pain on top of arm x 6-8 months"     HISTORY OF PRESENT ILLNESS:   75 year old male here for evaluation of left arm pain, bilateral hand tingling.  For past 6 to 8 months patient has had pain and tenderness in his left lateral epicondylar and posterior forearm region.  Also having tingling in bilateral fingertips.  Symptoms worse on the left than right.  Has some neck pain issues as well.  Patient referred here for evaluation of possible cervical radiculopathy versus neuropathy.  Patient has history of rheumatoid arthritis on arava.  Patient went to a rheumatologist for left shoulder cortisone injection without relief.   REVIEW OF SYSTEMS: Full 14 system review of systems performed and negative with exception of: as per HPI.  ALLERGIES: Allergies  Allergen Reactions   Atorvastatin Other (See Comments)    Muscle pain   Crestor [Rosuvastatin Calcium] Other (See Comments)    Muscle pain   Enalapril Other (See Comments)    Upset stomach   Lisinopril Cough   Losartan Other (See Comments)    Muscle pain    Telmisartan Other (See Comments)    Stomach ache    HOME MEDICATIONS: Outpatient Medications Prior to Visit  Medication Sig Dispense Refill   amiodarone (PACERONE) 200 MG tablet Take 0.5 tablets (100 mg total) by mouth daily. 90 tablet 3   carvedilol (COREG) 12.5 MG tablet Take 1 tablet (12.5 mg total) by mouth 2 (two) times daily with a meal. 180 tablet 3   chlorhexidine (PERIDEX) 0.12 % solution Use as directed 15 mLs in the mouth or throat daily as needed (irritation).   3   clopidogrel (PLAVIX) 75 MG tablet TAKE  1 TABLET BY MOUTH DAILY WITH BREAKFAST. 90 tablet 2   eplerenone (INSPRA) 25 MG tablet TAKE 1 TABLET BY MOUTH EVERY DAY 90 tablet 3   fluticasone (FLONASE) 50 MCG/ACT nasal spray Place 1-2 sprays into both nostrils daily as needed for allergies or rhinitis.     furosemide (LASIX) 40 MG tablet Take 1 tablet (40 mg total) by mouth daily. 90 tablet 3   leflunomide (ARAVA) 20 MG tablet Take 20 mg by mouth daily.     levothyroxine (SYNTHROID) 25 MCG tablet Take 1 tablet (25 mcg total) by mouth daily before breakfast. 30 tablet 5   Melatonin 10 MG TABS Take 10 mg by mouth at bedtime as needed (sleep).      simvastatin (ZOCOR) 20 MG tablet TAKE 1 TABLET BY MOUTH EVERYDAY AT BEDTIME 90 tablet 3   No facility-administered medications prior to visit.    PAST MEDICAL HISTORY: Past Medical History:  Diagnosis Date   Abnormal PFTs 11/2009   FVC 74%, FEV1 80%, ratio 75%, TLC 78%, DLCO 68%. this suggests a mild restrictive and obstructive defect. pt did have a response to bronchodilator. these PFTs were significantly better than the report from Dr. Alcide Clever in [place] done prior.    AICD (automatic cardioverter/defibrillator) present 2010   Anxiety    Atrial flutter (HCC)    s/p isthmus ablation 5/10   CAD (coronary artery disease)    hx of silent MI in 1993. likely  an inferior MI. hx of 2D cardiogram in 3/09 showing EF of 40%. hx of myoview in [place] 3/09-nml. presented to [facility] 5/10 with VT and mildly elevated cardiac enzymes LHC (5/10): inferobasal dyskinesis with EF 35-40%. was chronic total occlusion of mid RCA with good collaterals. luminals LCA. this does not appear to be an acute cause of the 5/10 event   Cancer ([place])    skin   Cervical radiculopathy    CHF (congestive heart failure) (HCC)    Chronic lower back pain    CKD (chronic kidney disease)    HLD (hyperlipidemia)    HTN (hypertension)    Ischemic cardiomyopathy    EF 35-40% by LV-gram 5/10 with inferobasal dyskinesis. echo 5/10 showed EF 40%  w/mild LVH, no sig. MR, inferobasal and posterobasal akinesis. 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 Scot Dock at [facility] in 2009    Rheumatoid arthritis [facility])    on leflunomide   Silent myocardial infarction [facility]) 1993   "silent"   Tobacco abuse    47 pack year hx; quit 8/09   Ventricular tachycardia    likely scar-mediated. 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 He has duel chamber Medtronic ICD    PAST SURGICAL HISTORY: Past Surgical History:  Procedure Laterality Date   CARDIAC DEFIBRILLATOR PLACEMENT  09/22/2008   CATARACT EXTRACTION W/ INTRAOCULAR LENS  IMPLANT, BILATERAL Bilateral 05/2016 - 06/2016   CORONARY BALLOON ANGIOPLASTY N/A 01/28/2020   Procedure: CORONARY BALLOON ANGIOPLASTY;  Surgeon: Jettie Booze, MD;  Location: [place] CV LAB;  Service: Cardiovascular;  Laterality: N/A;   CORONARY CTO INTERVENTION  07/16/2016   CORONARY CTO INTERVENTION N/A 07/16/2016   Procedure: Coronary CTO Intervention;  Surgeon: Belva Crome, MD;  Location: [place] CV LAB;  Service: Cardiovascular;  Laterality: N/A;   EP IMPLANTABLE DEVICE  09/22/08   Medtronic, ICD Model Number:  [id number], ICD Serial Number: [id number]   FEMORAL ARTERY - FEMORAL ARTERY BYPASS GRAFT  march 2009   left to right bypass, first @ [facility], second at [facility] by Dr Scot Dock   ICD GENERATOR CHANGEOUT N/A 11/12/2016   Procedure: ICD Generator Changeout;  Surgeon: Thompson Grayer, MD;  Location: [place] CV LAB;  Service: Cardiovascular;  Laterality: N/A;   RIGHT/LEFT HEART CATH AND CORONARY ANGIOGRAPHY N/A 07/12/2016   Procedure: Right/Left Heart Cath and Coronary Angiography;  Surgeon: Larey Dresser, MD;  Location: [place] CV LAB;  Service: Cardiovascular;  Laterality: N/A;   RIGHT/LEFT HEART CATH  AND CORONARY ANGIOGRAPHY N/A 01/28/2020   Procedure: RIGHT/LEFT HEART CATH AND CORONARY ANGIOGRAPHY;  Surgeon: Larey Dresser, MD;  Location: [place] CV LAB;  Service: Cardiovascular;  Laterality: N/A;   TONSILLECTOMY     VASECTOMY     VENTRICULAR ABLATION SURGERY  09/2008    FAMILY HISTORY: Family History  Problem Relation Age of Onset   Gastric cancer Mother    Throat cancer Father    Peripheral vascular disease Father    Heart attack Brother 3    SOCIAL HISTORY: Social History   Socioeconomic History   Marital status: Single    Spouse name: Not on file   Number of children: 0   Years of education: 12   Highest education level: Some college, no degree  Occupational History   Not  on file  Tobacco Use   Smoking status: Former    Packs/day: 1.00    Years: 47.00    Pack years: 47.00    Types: Cigarettes    Quit date: 2009    Years since quitting: 13.9   Smokeless tobacco: Never  Vaping Use   Vaping Use: Never used  Substance and Sexual Activity   Alcohol use: Yes    Comment: 07/15/2016 "nothing for the last couple years" 04/18/21 very seldom   Drug use: No    Comment: prior    Sexual activity: Not on file  Other Topics Concern   Not on file  Social History Narrative   Single; gets minimal exercise; 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 originally from [place]   Caffeine 3/4 c daily   Social Determinants of Health   Financial Resource Strain: Not on file  Food Insecurity: Not on file  Transportation Needs: Not on file  Physical Activity: Not on file  Stress: Not on file  Social Connections: Not on file  Intimate Partner Violence: Not on file     PHYSICAL EXAM  GENERAL EXAM/CONSTITUTIONAL: Vitals:  Vitals:   04/18/21 0805  BP: 115/62  Pulse: (!) 54  Weight: 154 lb 3.2 oz (69.9 kg)  Height: 5\' 10"  (1.778 m)   Body mass index is 22.13 kg/m. Wt Readings from Last 3 Encounters:  04/18/21 154 lb 3.2 oz (69.9 kg)  02/14/21 134 lb (60.8 kg)  01/18/21 141 lb  9.6 oz (64.2 kg)   Patient is in no distress; well developed, nourished and groomed; neck is supple  CARDIOVASCULAR: Examination of carotid arteries is normal; no carotid bruits Regular rate and rhythm, no murmurs Examination of peripheral vascular system by observation and palpation is normal  EYES: Ophthalmoscopic exam of optic discs and posterior segments is normal; no papilledema or hemorrhages No results found.  MUSCULOSKELETAL: Gait, strength, tone, movements noted in Neurologic exam below  NEUROLOGIC: MENTAL STATUS:  No flowsheet data found. awake, alert, oriented to person, place and time recent and remote memory intact normal attention and concentration language fluent, comprehension intact, naming intact fund of knowledge appropriate  CRANIAL NERVE:  2nd - no papilledema on fundoscopic exam 2nd, 3rd, 4th, 6th - pupils equal and reactive to light, visual fields full to confrontation, extraocular muscles intact, no nystagmus 5th - facial sensation symmetric 7th - facial strength symmetric 8th - hearing intact 9th - palate elevates symmetrically, uvula midline 11th - shoulder shrug symmetric 12th - tongue protrusion midline  MOTOR:  normal bulk and tone, full strength in the BUE, BLE MILD TENDERNESS IN FINGER / WRIST EXTENSOR TENDONS ON LEFT NEAR LATERAL EPICONDYLE  SENSORY:  normal and symmetric to light touch, pinprick, temperature, vibration; DECR IN FEET  COORDINATION:  finger-nose-finger, fine finger movements normal  REFLEXES:  deep tendon reflexes present and symmetric  GAIT/STATION:  narrow based gait; able to walk on toes, heels and tandem; romberg is negative     DIAGNOSTIC DATA (LABS, IMAGING, TESTING) - I reviewed patient records, labs, notes, testing and imaging myself where available.  Lab Results  Component Value Date   WBC 8.7 01/28/2020   HGB 10.5 (L) 01/28/2020   HGB 10.2 (L) 01/28/2020   HCT 31.0 (L) 01/28/2020   HCT 30.0 (L)  01/28/2020   MCV 94.4 01/28/2020   PLT 287 01/28/2020      Component Value Date/Time   NA 136 10/18/2020 0949   NA 138 08/14/2016 0957   K 4.7 10/18/2020 0949  CL 105 10/18/2020 0949   CO2 25 10/18/2020 0949   GLUCOSE 103 (H) 10/18/2020 0949   BUN 20 10/18/2020 0949   BUN 31 (H) 08/14/2016 0957   CREATININE 2.21 (H) 10/18/2020 0949   CALCIUM 8.8 (L) 10/18/2020 0949   PROT 6.4 (L) 10/18/2020 0949   PROT 5.9 (L) 08/14/2016 0957   ALBUMIN 3.4 (L) 10/18/2020 0949   ALBUMIN 3.6 08/14/2016 0957   AST 16 10/18/2020 0949   ALT 12 10/18/2020 0949   ALKPHOS 84 10/18/2020 0949   BILITOT 0.4 10/18/2020 0949   BILITOT <0.2 08/14/2016 0957   GFRNONAA 30 (L) 10/18/2020 0949   GFRAA 42 (L) 01/28/2020 0955   Lab Results  Component Value Date   CHOL 125 10/18/2020   HDL 35 (L) 10/18/2020   LDLCALC 65 10/18/2020   TRIG 126 10/18/2020   CHOLHDL 3.6 10/18/2020   Lab Results  Component Value Date   HGBA1C 6.0 09/01/2014   No results found for: [id number] Lab Results  Component Value Date   TSH 7.191 (H) 10/18/2020       ASSESSMENT AND PLAN  75 y.o. year old male here with:  Dx:  1. Left arm pain   2. Bilateral hand numbness      PLAN:  LEFT ARM PAIN (with neck pain; left hand tingling)  - ddx: lateral epicondylitis, cervical radiculopathy, peripheral neuropathy / carpal tunnel syndrome  - check MRI cervical spine (eval for cervical radiculopathy; failed conservative mgmt; history of rheumatoid arthritis); has ICD (2010; 2018; needs MRI safety eval; patient prefers [facility], AM appts)  Defibrillator details [employer] MRI XT DR defibrillator 11/12/16 [facility] 09/22/08 [id number] 09/22/08 0017-49  - check EMG/NCS (eval for peripheral neuropathy, CTS)  - consider PT/OT eval  - patient will explore chiropractic eval   Orders Placed This Encounter  Procedures   MR CERVICAL SPINE WO CONTRAST   NCV with EMG(electromyography)   Return for for NCV/EMG.  I spent 60  minutes of face-to-face and non-face-to-face time with patient.  This included previsit chart review, lab review, study review, order entry, electronic health record documentation, patient education.     Penni Bombard, MD 44/96/7591, 6:38 AM Certified in Neurology, Neurophysiology and Neuroimaging  [facility] [address], [place], [place] [address] [phone]

## 2021-04-19 ENCOUNTER — Ambulatory Visit (HOSPITAL_COMMUNITY)
Admission: RE | Admit: 2021-04-19 | Discharge: 2021-04-19 | Disposition: A | Discharge status: Home / Self Care | Payer: Medicare Other | Source: Ambulatory Visit | Attending: Cardiology | Admitting: Cardiology

## 2021-04-19 ENCOUNTER — Other Ambulatory Visit: Payer: Self-pay

## 2021-04-19 ENCOUNTER — Encounter (HOSPITAL_COMMUNITY): Payer: Self-pay | Admitting: Cardiology

## 2021-04-19 VITALS — BP 118/70 | HR 54 | Wt 153.4 lb

## 2021-04-19 DIAGNOSIS — Z79899 Other long term (current) drug therapy: Secondary | ICD-10-CM | POA: Insufficient documentation

## 2021-04-19 DIAGNOSIS — I714 Abdominal aortic aneurysm, without rupture, unspecified: Secondary | ICD-10-CM | POA: Diagnosis not present

## 2021-04-19 DIAGNOSIS — E039 Hypothyroidism, unspecified: Secondary | ICD-10-CM | POA: Diagnosis not present

## 2021-04-19 DIAGNOSIS — Z955 Presence of coronary angioplasty implant and graft: Secondary | ICD-10-CM | POA: Insufficient documentation

## 2021-04-19 DIAGNOSIS — Z95810 Presence of automatic (implantable) cardiac defibrillator: Secondary | ICD-10-CM | POA: Insufficient documentation

## 2021-04-19 DIAGNOSIS — E785 Hyperlipidemia, unspecified: Secondary | ICD-10-CM | POA: Insufficient documentation

## 2021-04-19 DIAGNOSIS — Z7902 Long term (current) use of antithrombotics/antiplatelets: Secondary | ICD-10-CM | POA: Insufficient documentation

## 2021-04-19 DIAGNOSIS — I2510 Atherosclerotic heart disease of native coronary artery without angina pectoris: Secondary | ICD-10-CM

## 2021-04-19 DIAGNOSIS — I739 Peripheral vascular disease, unspecified: Secondary | ICD-10-CM | POA: Diagnosis not present

## 2021-04-19 DIAGNOSIS — N183 Chronic kidney disease, stage 3 unspecified: Secondary | ICD-10-CM | POA: Diagnosis not present

## 2021-04-19 DIAGNOSIS — I252 Old myocardial infarction: Secondary | ICD-10-CM | POA: Diagnosis not present

## 2021-04-19 DIAGNOSIS — Z9861 Coronary angioplasty status: Secondary | ICD-10-CM

## 2021-04-19 DIAGNOSIS — Z79890 Hormone replacement therapy (postmenopausal): Secondary | ICD-10-CM | POA: Insufficient documentation

## 2021-04-19 DIAGNOSIS — I472 Ventricular tachycardia, unspecified: Secondary | ICD-10-CM | POA: Insufficient documentation

## 2021-04-19 DIAGNOSIS — I255 Ischemic cardiomyopathy: Secondary | ICD-10-CM | POA: Diagnosis not present

## 2021-04-19 DIAGNOSIS — I5022 Chronic systolic (congestive) heart failure: Secondary | ICD-10-CM | POA: Insufficient documentation

## 2021-04-19 LAB — COMPREHENSIVE METABOLIC PANEL
ALT: 14 U/L (ref 0–44)
AST: 16 U/L (ref 15–41)
Albumin: 3.6 g/dL (ref 3.5–5.0)
Alkaline Phosphatase: 105 U/L (ref 38–126)
Anion gap: 7 (ref 5–15)
BUN: 43 mg/dL — ABNORMAL HIGH (ref 8–23)
CO2: 24 mmol/L (ref 22–32)
Calcium: 8.9 mg/dL (ref 8.9–10.3)
Chloride: 107 mmol/L (ref 98–111)
Creatinine, Ser: 2.38 mg/dL — ABNORMAL HIGH (ref 0.61–1.24)
GFR, Estimated: 28 mL/min — ABNORMAL LOW (ref >60)
Glucose, Bld: 107 mg/dL — ABNORMAL HIGH (ref 70–99)
Potassium: 5.4 mmol/L — ABNORMAL HIGH (ref 3.5–5.1)
Sodium: 138 mmol/L (ref 135–145)
Total Bilirubin: 0.5 mg/dL (ref 0.3–1.2)
Total Protein: 6.8 g/dL (ref 6.5–8.1)

## 2021-04-19 LAB — LIPID PANEL
Cholesterol: 123 mg/dL (ref 0–200)
HDL: 49 mg/dL (ref >40)
LDL Cholesterol: 57 mg/dL (ref 0–99)
Total CHOL/HDL Ratio: 2.5 RATIO
Triglycerides: 83 mg/dL (ref <150)
VLDL: 17 mg/dL (ref 0–40)

## 2021-04-19 LAB — TSH: TSH: 3.9 u[IU]/mL (ref 0.350–4.500)

## 2021-04-19 NOTE — Patient Instructions (Signed)
No medication changes!  Please call Dr Curt Bears office to schedule an appointment.  [phone]   Labs today We will only contact you if something comes back abnormal or we need to make some changes. Otherwise no news is good news!  Your physician recommends that you schedule a follow-up appointment in: 3 months with Dr Aundra Dubin  Do the following things EVERYDAY: Weigh yourself in the morning before breakfast. Write it down and keep it in a log. Take your medicines as prescribed Eat low salt foods--Limit salt (sodium) to 2000 mg per day.  Stay as active as you can everyday Limit all fluids for the day to less than 2 liters  Please call office at [phone] option 2 if you have any questions or concerns.   At the [facility], you and your health needs are our priority. As part of our continuing mission to provide you with exceptional heart care, we have created designated Provider Care Teams. These Care Teams include your primary Cardiologist (physician) and Advanced Practice Providers (APPs- Physician Assistants and Nurse Practitioners) who all work together to provide you with the care you need, when you need it.   You may see any of the following providers on your designated Care Team at your next follow up: Dr Glori Bickers Dr Haynes Kerns, NP Lyda Jester, [place] [facility], [place] Audry Riles, PharmD   Please be sure to bring in all your medications bottles to every appointment.  '

## 2021-04-19 NOTE — Progress Notes (Signed)
ReDS Vest / Clip - 04/19/21 0900       ReDS Vest / Clip   Station Marker C    Ruler Value 25    ReDS Value Range Low volume    ReDS Actual Value 31

## 2021-04-19 NOTE — Addendum Note (Signed)
Encounter addended by: Larey Dresser, MD on: 04/19/2021 10:56 PM  Actions taken: Clinical Note Signed

## 2021-04-19 NOTE — Progress Notes (Addendum)
ID:  Nathan Pruitt, DOB Nov 08, 1945, MRN [id number]   Provider location: [facility] Type of Visit: Established patient  PCP:  Ronita Hipps, MD  Cardiologist:  Dr. Aundra Dubin   History of Present Illness: 75 y.o. with a history of CAD, PAD, ischemic CMP, and VT s/p ICD.   Echo in 2/17 showed EF 40% and regional WMAs.   In 3/18, he developed episodes of VT as well as increased dyspnea.  LHC/RHC was done, showing new occlusion of a large ramus (CTO, probably a month or so old at time of cath) and old CTO RCA with collaterals.  He had DES x 3 to ramus.  Filling pressures were optimized on RHC.  Amiodarone was increased to 200 mg daily from 100 mg daily.  No VT since PCI.    Echo in 6/19 showed EF 35-40% with wall motion abnormalities. Echo in 1/21 showed EF 40-45% with basal to mid inferolateral and inferior akinesis, mild MR, normal RV, PASP 35 mmHg.   LHC/RHC in 9/21 with normal filling pressures and cardiac output but 95% OM1 stenosis.  This was treated with DES to OM1.   He has been following with Dr. Vaughan Browner with idiopathic pulmonary fibrosis.  I was concerned that he could have amiodarone -related lung toxicity but Dr. Vaughan Browner did not think this was very likely.  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     Labs drawn in 1/22 showed K up to 5.9 and creatinine 4.  He was sent to the ER at [facility].  He was found to have COVID-19 and was admitted. He had a productive cough and sore throat.  He was thought to be dehydrated and got IV fluid.  Creatinine 2.3 at discharge.  Lasix, valsartan, and eplerenone were stopped.  Other meds were continued.  Echo in 6/22 showed EF 30-35%, basal-mid inferior and inferolateral akinesis, anterolateral hypokinesis, moderate MR (appears infarct-related), mildly decreased RV function.   He returns for followup of CHF.  Using oxygen when he goes shopping.  Breathing is unchanged, stable dyspnea with  moderate exertion.  Generally does ok walking on flat ground for relatively short distances. No orthopnea/PND.  No chest pain.  Of note, device interrogation shows that he is RV pacing close to 50% of the time. Weight is down 2 lbs.   Medtronic device interrogation: RV pacing close to 50% of the time.  Fluid index > threshold.   REDS clip 31%  ECG: NSR,1st degree AVB (358 msec), RBBB   Labs (11/10): K 4.9, creatinine 1.4, LDL 70, HDL 31, LFTs normal Labs (3/11): K 4.4, creatinine 1.3, LDL 61, HDL 35 Labs (7/11): LDL 62, HDL 23 Labs (1/12): K 4.8, LFTs normal, creatinine 1.44, LDL 69, HDL 33 Labs (7/12): LFTs normal, LDL 81, HDL 36, HCT normal, K 4.3, creatinine 1.3, BNP 116, TSH normal Labs (1/13): LDL 39, HDl 31, LFTs normal, TSH  Labs (7/13): TSH normal, LFTs normal, LDL 69, HDL 40 Labs (8/13): K 4.5, creatinine 1.6 Labs (10/13): K 4.4, creatinine 1.4 Labs (3/14): LDL 108, HDL 30, LFTs normal Labs (9/14): K 4.2, creatinine 1.6, LFTs normal, LDL 114, TSH 10.6 (elevated), free T4/free T3 normal Labs (2/15): K 4.6, creatinine 1.5, LFTs normal, TSH 7.6 (elevated), free T4 and free T3 normal Labs (3/15): K 3.6, creatinine 1.5, LFTs normal, TSH normal Labs (9/15): K 4.3, creatinine 1.6, LFTs normal, TSH normal, LDL 115, HDL 24  Labs (10/16): K 4.5, creatinine 1.5, LDL 90, HDL 35, hgb 12.8 Labs (11/16): Low free T3, normal TSH and free T4 Labs (12/16): K 5, creatinine 1.8, LFTs normal, TSH 8.6 (mildly elevated) Labs (1/17): Free T4/TSH/free T3 normal, LFTs normal Labs (2/17): K 4.4, creatinine 2.4, LDL 131, HDL 34 Labs (4/17): K 4.2, creatinine 1.3, LDL 67, HDL 35 Labs (9/17): K 4.9, creatinine 2.2, LDL 54, HDL 35, free T3 and T4 normal, hgb 12 Labs (4/18): K 5, creatinine 1.78, LFTs normal, TSH elevated but free T3 and free T4 normal.  Labs (6/18): LDL 52, HDL 35, TSH elevated 6.8, LFTs normal Labs (7/18): K 4.3, creatinine 1.68 Labs (10/18): TSH 5.6, free T3 normal, free T4 normal, LFTs  normal, K 4.9, creatinine 1.4 Labs (11/18): K 4.5, creatinine 1.46, LFTs normal Labs (4/19): K 4.8, creatinine 1.6, LFTs normal Labs (6/19): TSH slightly elevated, free T3/T4 both normal, LFTs normal Labs (7/19): K 4.3, creatinine 1.3 Labs (10/19): LFTs normal, LDL 58, TGs 165, TSH 6.6 but free T4 and free T3 normal Labs (1/20): K 4.7, creatinine 1.7 Labs (2/20): LFTs normal Labs (4/20): K 4.6, creatinine 1.4, LDL 77, HDL 36, hgb 12.2, TSH mild elevation 7.4, free T4 and free T3 normal.  Labs (7/20): K 3.8, creatinine 1.6, hgb 12.2, LDL 81, TGs 158, HDL 35, TSH 7.7 (mildly elevated), LFTs normal Labs (11/20): K 4.5, creatinine 1.7, NT-proBNP 1358, TSH 5.6 (mild elevation), LDL 88, LFTs normal Labs (4/21): K 4.3, creatinine 1.6, hgb 12.4, LFTs, normal, TGs 105, LDL 81, HDL 40, TSH 5.6 (mildly elevated), free T3 and free T4 normal Labs (6/21): K 4.7, creatinine 1.9, AST/ALT normal, TSH mildly elevated at 7.9, free T3 and T4 normal, LDL 87, TGs 118 Labs (10/21): K 4.4, creatinine 1.8, LFTs normal, LDL 89, pro-BNP 1830, LFTs normal Labs (11/21): ESR 48, K 4.6, creatinine 1.96 Labs (12/21): ANA negative Labs (1/22): K 5.9, creatinine 4 => 2.3 Labs (2/22): K 4.6, creatinine 2.1 => 2.2, LDL 77, LFTs normal Labs (4/22): K 4.7,creatinine 2.7, TSH mildly elevated but free T3 and free T4 normal, hgb 10.2, LDL 67, LFTs normal Labs (5/22): creatinine 2.0 Labs (6/22): K 4.7, creatinine 2.4 Labs (9/22): K 4.4, creatinine 2.6, LFTs normal, hgb 9.9, LDL 62, TSH 5.94, free T4 normal, free T3 low.  Labs (10/22): TSH 4.99   Allergies (verified):  No Known Drug Allergies   Past Medical History: 1. Coronary artery disease. The patient reports history of silent MI in 1993.  This was likely an inferior MI (see below).  - The patient presented to [facility] in 5[facility]10 with VT and mildly elevated cardiac enzymes. LHC (5/10):  Inferobasal dyskinesis with EF 35-40%.  There was chronic total occlusion of the mid RCA  with good collaterals.  Luminals LCA.  This did not appear to be an acute cause of the 5/10 event.  - LHC (3/18): Known occlusion of RCA with collaterals, new occlusion of ramus.  DES x 3 to ramus.  - LHC (9/21): Known occlusion RCA with collaterals, 95% OMI treated with DES.  2. Hypertension.  3. Hyperlipidemia: Intolerant of higher doses of simvastatin, Crestor, Lipitor, pravastatin. Zetia caused constipation.  4. Remote tobacco abuse with 47-pack-year history, quitting in August 2009.  5. Peripheral arterial disease.  - Status post left-to-right fem-fem bypass performed in [facility] in March 2009.  - Status post redo left-to-right fem-fem bypass performed by Dr. Scot Dock at [facility] in 2009.  - ABIs normal 3/15, ABIs normal 3/16, ABIs  normal 3/17, ABIs normal 4/18, ABIs normal 4/19, ABIs normal 12/20.  6. Rheumatoid arthritis, on leflunomide.  7.  Ischemic cardiomyopathy:  EF 35-40% by LV-gram 5/10 with inferobasal dyskinesis.  Echo 5/10 showed EF 40% with mild LVH, no significant MR, inferobasal and posterobasal akinesis.  Echo (7/11): EF 50%, mild LVH, basal-mid inferoposterior akinesis.  Echo (7/12): EF 45-50% with basal anterolateral, basal posterior, and basal to mid inferior akinesis.  Echo (4/16): EF 40-45%, basal to mid inferolateral AK, basal inferior AK, basal to mid anterolateral HK.  Echo (2/17): EF 40% with basal to mid inferior akinesis, basal inferolateral aneurysm, mid inferolateral akinesis, basal anterolateral hypokinesis, normal RV size and systolic function, PASP 25 mmHg.  - Echo (3/18) with EF 30-35%, moderate LV dilation, moderate MR, mildly decreased RV systolic function, PASP 51 mmmHg. - RHC (3/18): mean RA 4, PA 38/12, mean PCWP 17, CI 2.2.  - Echo (6/19): EF 35-40%, inferior/inferolateral/anterolateral WMAs, moderate MR likely infarct-related, normal RV size with mildly decreased systolic function.  - Echo (1/21): EF 35-40% with wall motion abnormalities. Echo was done  today and reviewed, EF 40-45% with basal to mid inferolateral and inferior akinesis, mild MR, normal RV, PASP 35 mmHg.  - RHC (9/21): mean RA 4, PA 33/12, mean PCWP 9, CI 3.12 - Echo (6/22): EF 30-35%, basal-mid inferior and inferolateral akinesis, anterolateral hypokinesis, moderate MR (appears infarct-related), mildly decreased RV function.  8.  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  He has a dual chamber Medtronic ICD.  9.  Atrial flutter:  Status post isthmus ablation 5/10.   10. Restrictive lung defect: PFTs (7/11): FVC 74%, FEV1 80%, ratio 75%, TLC 78%, DLCO 68%.  This suggests a mild restrictive defect and a mild obstructive defect.  He did have response to bronchodilator. These PFTs were significantly better than the report from Dr. Alcide Clever in [place] done prior.  He had last PFTs in 2/12 with no significant change.  - PFTs (9/21) with significantly decreased DLCO and concern for ILD/pulmonary fibrosis.  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  11.  Anxiety 12.  Chronic cough: No relief with change from ACEI to ARB or with trial of PPI.  13.  CKD: Stage 3.  14. Mitral regurgitation: Moderate on 6/22 echo, suspect infarct-related.  15. AAA: 3.1 cm AAA on abdominal US 12/20  - Abdominal US 5/22 with 3.1 cm AAA 16. COVID-19 infection: 1/22.   Current Outpatient Medications  Medication Sig Dispense Refill   amiodarone (PACERONE) 200 MG tablet Take 0.5 tablets (100 mg total) by mouth daily. 90 tablet 3   carvedilol (COREG) 12.5 MG tablet Take 1 tablet (12.5 mg total) by mouth 2 (two) times daily with a meal. 180 tablet 3   chlorhexidine (PERIDEX) 0.12 % solution Use as directed 15 mLs in the mouth or throat daily as needed (irritation).   3   clopidogrel (PLAVIX) 75 MG tablet TAKE 1 TABLET BY MOUTH DAILY WITH BREAKFAST. 90 tablet 2   eplerenone (INSPRA) 25 MG tablet TAKE 1 TABLET BY MOUTH EVERY DAY 90  tablet 3   fluticasone (FLONASE) 50 MCG/ACT nasal spray Place 1-2 sprays into both nostrils daily as needed for allergies or rhinitis.     furosemide (LASIX) 40 MG tablet Take 1 tablet (40 mg total) by mouth daily. 90 tablet 3   leflunomide (ARAVA) 20 MG tablet Take 20 mg by mouth daily.     levothyroxine (SYNTHROID)  25 MCG tablet Take 1 tablet (25 mcg total) by mouth daily before breakfast. 30 tablet 5   Melatonin 10 MG TABS Take 10 mg by mouth at bedtime as needed (sleep).      simvastatin (ZOCOR) 20 MG tablet TAKE 1 TABLET BY MOUTH EVERYDAY AT BEDTIME 90 tablet 3   No current facility-administered medications for this encounter.    Allergies:   Atorvastatin, Crestor [rosuvastatin calcium], Enalapril, Lisinopril, Losartan, and Telmisartan   Social History:  The patient  reports that he quit smoking about 13 years ago. His smoking use included cigarettes. He has a 47.00 pack-year smoking history. He has never used smokeless tobacco. He reports current alcohol use. He reports that he does not use drugs.   Family History:  The patient's family history includes Gastric cancer in his mother; Heart attack (age of onset: 40) in his brother; Peripheral vascular disease in his father; Throat cancer in his father.   ROS:  Please see the history of present illness.   All other systems are personally reviewed and negative.   Exam:   BP 118/70    Pulse (!) 54    Wt 69.6 kg (153 lb 6.4 oz)    SpO2 98%    BMI 22.01 kg/m  General: NAD Neck: No JVD, no thyromegaly or thyroid nodule.  Lungs: Clear to auscultation bilaterally with normal respiratory effort. CV: Nondisplaced PMI.  Heart regular S1/S2, no S3/S4, no murmur.  No peripheral edema.  No carotid bruit.  Normal pedal pulses.  Abdomen: Soft, nontender, no hepatosplenomegaly, no distention.  Skin: Intact without lesions or rashes.  Neurologic: Alert and oriented x 3.  Psych: Normal affect. Extremities: No clubbing or cyanosis.  HEENT: Normal.    Recent Labs: 04/19/2021: ALT 14; BUN 43; Creatinine, Ser 2.38; Potassium 5.4; Sodium 138; TSH 3.900  Personally reviewed   Wt Readings from Last 3 Encounters:  04/19/21 69.6 kg (153 lb 6.4 oz)  04/18/21 69.9 kg (154 lb 3.2 oz)  02/14/21 60.8 kg (134 lb)     ASSESSMENT AND PLAN:  1. Chronic systolic CHF: Ischemic cardiomyopathy.  Medtronic ICD.  Echo in 6/22 with EF 30-35%, mild RV dysfunction. NYHA class III symptoms, stable.  Though Optivol suggests fluid accumulation, by exam and REDS vest he is not volume overloaded and weight is down.  Complicated by cardiorenal syndrome.  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 Curt Bears soon.     - Continue Lasix 40 mg daily, as above I do not think that he is volume overloaded.  BMET today.  - Continue eplerenone 25 mg daily, will not try to increase until I get today's BMET back.  - He is off valsartan for now with elevated creatinine, may be able to start back on low dose in the future.   - Continue Coreg 12.5 mg bid.   - He did not tolerate Jardiance.  Wilder Glade is too expensive for him, we have tried several times to find a way to get it for him to no avail.  2. CAD: Denies chest pain.  Had DES x 3 to ramus in 3/18 and DES to [place] in 9/21.   - Continue Zocor, check lipids today.  - Continue Plavix long-term,  off ASA.    3. Hyperlipidemia: Stable on simvastatin without myalgias.  Has not been able to tolerate higher dose of simvastatin or other statins. Unable to tolerate Zetia due to constipation.   - Check LDL today.   4. Hx of VT: Has been on amiodarone 100 mg daily. Has ICD.  He did not tolerate sotalol.  - He is on Levoxyl for amiodarone-induced mild hypothyroidism, check TSH today.  - Check LFTs today.   - Knows to get a yearly eye exam - See  below regarding ?amiodarone lung toxicity.  5. PAD: He denies claudication.  Normal ABIs in 12/20, followed by [facility]. 6. AAA: 3.1 cm in 5/22, followed by [facility].  7. CKD: Stage 3.   - Needs BMET today.  - Follows with nephrology now.   8. Pulmonary: PFTs were done with increased dyspnea and amiodorone use, concerning for ILD/pulmonary fibrosis.  High resolution CT chest was concerning for ILD.  I have worried about amiodarone-induced lung toxicity. 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  He was offered antifibrotic treatment but wants to hold off for now. I think that some of his symptomatology is due to IPF.   98% on room air at rest today.  - Can continue amidorone 100 mg daily as above.    - Will need pulmonary followup (sees Dr. Vaughan Browner in around a month).    Followup in 3 months with me, will try to get him in with EP soon to consider CRT.     Signed, Loralie Champagne, MD  04/19/2021   [facility] [address] [facility] [place] [address] [phone] (office) [phone] (fax)

## 2021-04-20 ENCOUNTER — Telehealth: Payer: Self-pay | Admitting: Pulmonary Disease

## 2021-04-20 ENCOUNTER — Telehealth (HOSPITAL_COMMUNITY): Payer: Self-pay

## 2021-04-20 DIAGNOSIS — I5022 Chronic systolic (congestive) heart failure: Secondary | ICD-10-CM

## 2021-04-20 NOTE — Telephone Encounter (Signed)
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  Please let patient know that CT shows his scarring is slightly worse.  Will discuss in detail at time of return clinic visit after PFTs

## 2021-04-20 NOTE — Telephone Encounter (Signed)
Called and spoke with Patient.  Dr. Matilde Bash results and recommendations given. Understanding stated.  Nothing further at this time.

## 2021-04-20 NOTE — Telephone Encounter (Signed)
-----   Message from Larey Dresser, MD sent at 04/20/2021  1:35 PM EST ----- Repeat labs next week in [place] and fax to [place].  Do not change meds for now.

## 2021-04-20 NOTE — Telephone Encounter (Signed)
Pt will have labs redone next week. Advised to continue following a low k diet.  Script sent to be done locally and faxed.

## 2021-04-21 ENCOUNTER — Encounter (HOSPITAL_COMMUNITY): Payer: Self-pay | Admitting: Cardiology

## 2021-04-23 ENCOUNTER — Encounter: Payer: Self-pay | Admitting: Diagnostic Neuroimaging

## 2021-04-23 ENCOUNTER — Other Ambulatory Visit: Payer: Self-pay

## 2021-04-25 NOTE — Telephone Encounter (Signed)
I sent the order to [facility] if it is MRI safe they will reach out to the patient to schedule.

## 2021-05-03 ENCOUNTER — Other Ambulatory Visit: Payer: Self-pay | Admitting: Internal Medicine

## 2021-05-04 ENCOUNTER — Ambulatory Visit: Payer: Medicare Other | Admitting: Neurology

## 2021-05-08 ENCOUNTER — Other Ambulatory Visit (HOSPITAL_COMMUNITY): Payer: Self-pay | Admitting: Cardiology

## 2021-05-08 ENCOUNTER — Telehealth: Payer: Self-pay

## 2021-05-08 NOTE — Telephone Encounter (Signed)
Patient called in wanting to know if someone can go over his last transmission since its telling him he is at risk for heart failure.

## 2021-05-08 NOTE — Telephone Encounter (Signed)
Successful telephone encounter to patient to follow up on his request for call back. Patient concerned about verbage "TriageHF (HF Risk) Alert: High (Ongoing). Reviewed transmission with patient and discussed OptiVol. Trending back to baseline. Discussed effects of sodium on fluid retention. Patient admits to eating cured ham over several days intermittently "because I get tired of chicken". Patient advised to read nutrition labels and keep sodium intake to 2000mg  or less. Patient denies s/s of volume overload including increased shortness of breath, swelling of hands, feet, and abdomen, or palpitations. Patient appreciative of education and reassurance. Will continue to monitor.

## 2021-05-13 NOTE — Progress Notes (Signed)
Electrophysiology Office Note   Date:  05/14/2021   ID:  Nathan Pruitt, DOB 05-May-1946, MRN [id number]  PCP:  Nathan Hipps, MD  Cardiologist:  Nathan Pruitt Primary Electrophysiologist:  Nathan Duba Meredith Leeds, MD    Chief Complaint: CHF   History of Present Illness: Nathan Pruitt is a 76 y.o. male who is being seen today for the evaluation of CHF at the request of Nathan Dresser, MD. Presenting today for electrophysiology evaluation.  He has a history significant for coronary artery disease, hypertension, hyperlipidemia, remote tobacco abuse, PAD, rheumatoid arthritis, restrictive lung disease, ischemic cardiomyopathy.  He also has atrial flutter and is status post isthmus ablation in 2010.    In 2018 he developed episodes of ventricular tachycardia.  Right and left heart catheterization was done that showed a new occlusion and a large ramus branch and an old CTO of the RCA with collaterals.  He had drug-eluting stent x3 to the ramus.  His amiodarone was increased.  He is status post Medtronic ICD.  Today he is pacing 44% of the time.  He currently feels well and is without complaint.  Today, he denies symptoms of palpitations, chest pain, shortness of breath, orthopnea, PND, lower extremity edema, claudication, dizziness, presyncope, syncope, bleeding, or neurologic sequela. The patient is tolerating medications without difficulties.    Past Medical History:  Diagnosis Date   Abnormal PFTs 11/2009   FVC 74%, FEV1 80%, ratio 75%, TLC 78%, DLCO 68%. this suggests a mild restrictive and obstructive defect. pt did have a response to bronchodilator. these PFTs were significantly better than the report from Dr. Alcide Clever in [place] done prior.    AICD (automatic cardioverter/defibrillator) present 2010   Anxiety    Atrial flutter (HCC)    s/p isthmus ablation 5/10   CAD (coronary artery disease)    hx of silent MI in 1993. likely an inferior MI. hx of 2D cardiogram in 3/09 showing EF of  40%. hx of myoview in [place] 3/09-nml. presented to [facility] 5/10 with VT and mildly elevated cardiac enzymes LHC (5/10): inferobasal dyskinesis with EF 35-40%. was chronic total occlusion of mid RCA with good collaterals. luminals LCA. this does not appear to be an acute cause of the 5/10 event   Cancer ([place])    skin   Cervical radiculopathy    CHF (congestive heart failure) (HCC)    Chronic lower back pain    CKD (chronic kidney disease)    HLD (hyperlipidemia)    HTN (hypertension)    Ischemic cardiomyopathy    EF 35-40% by LV-gram 5/10 with inferobasal dyskinesis. echo 5/10 showed EF 40% w/mild LVH, no sig. MR, inferobasal and posterobasal akinesis. 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 Scot Dock at [facility] in 2009    Rheumatoid arthritis [facility])    on leflunomide   Silent myocardial infarction [facility]) 1993   "silent"   Tobacco abuse    47 pack year hx; quit 8/09   Ventricular tachycardia    likely scar-mediated. 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 He has duel chamber Medtronic ICD   Past Surgical History:  Procedure Laterality Date   CARDIAC DEFIBRILLATOR PLACEMENT  09/22/2008   CATARACT EXTRACTION W/ INTRAOCULAR LENS  IMPLANT, BILATERAL Bilateral 05/2016 - 06/2016   CORONARY BALLOON ANGIOPLASTY N/A 01/28/2020   Procedure: CORONARY BALLOON  ANGIOPLASTY;  Surgeon: Jettie Booze, MD;  Location: [place] [facility];  Service: Cardiovascular;  Laterality: N/A;   CORONARY CTO INTERVENTION  07/16/2016   CORONARY CTO INTERVENTION N/A 07/16/2016   Procedure: Coronary CTO Intervention;  Surgeon: Belva Crome, MD;  Location: [place] [facility];  Service: Cardiovascular;  Laterality: N/A;   EP IMPLANTABLE DEVICE  09/22/08   Medtronic, ICD Model Number:  [id number], ICD Serial Number: [id number]   FEMORAL ARTERY -  FEMORAL ARTERY BYPASS GRAFT  march 2009   left to right bypass, first @ [facility], second at [facility] by Dr Scot Dock   ICD GENERATOR CHANGEOUT N/A 11/12/2016   Procedure: ICD Generator Changeout;  Surgeon: Thompson Grayer, MD;  Location: [place] [facility];  Service: Cardiovascular;  Laterality: N/A;   RIGHT/LEFT HEART CATH AND CORONARY ANGIOGRAPHY N/A 07/12/2016   Procedure: Right/Left Heart Cath and Coronary Angiography;  Surgeon: Nathan Dresser, MD;  Location: [place] [facility];  Service: Cardiovascular;  Laterality: N/A;   RIGHT/LEFT HEART CATH AND CORONARY ANGIOGRAPHY N/A 01/28/2020   Procedure: RIGHT/LEFT HEART CATH AND CORONARY ANGIOGRAPHY;  Surgeon: Nathan Dresser, MD;  Location: [place] [facility];  Service: Cardiovascular;  Laterality: N/A;   TONSILLECTOMY     VASECTOMY     VENTRICULAR ABLATION SURGERY  09/2008     Current Outpatient Medications  Medication Sig Dispense Refill   amiodarone (PACERONE) 200 MG tablet Take 0.5 tablets (100 mg total) by mouth daily. 90 tablet 3   carvedilol (COREG) 6.25 MG tablet Take 1 tablet (6.25 mg total) by mouth 2 (two) times daily. 180 tablet 3   chlorhexidine (PERIDEX) 0.12 % solution Use as directed 15 mLs in the mouth or throat daily as needed (irritation).   3   clopidogrel (PLAVIX) 75 MG tablet TAKE 1 TABLET BY MOUTH DAILY WITH BREAKFAST. 90 tablet 2   eplerenone (INSPRA) 25 MG tablet TAKE 1 TABLET BY MOUTH EVERY DAY 90 tablet 3   fluticasone (FLONASE) 50 MCG/ACT nasal spray Place 1-2 sprays into both nostrils daily as needed for allergies or rhinitis.     furosemide (LASIX) 40 MG tablet Take 1 tablet (40 mg total) by mouth daily. 90 tablet 3   leflunomide (ARAVA) 20 MG tablet Take 20 mg by mouth daily.     levothyroxine (SYNTHROID) 25 MCG tablet Take 1 tablet (25 mcg total) by mouth daily before breakfast. 30 tablet 5   Melatonin 10 MG TABS Take 10 mg by mouth at bedtime as needed (sleep).      simvastatin (ZOCOR) 20 MG  tablet TAKE 1 TABLET BY MOUTH EVERYDAY AT BEDTIME 90 tablet 3   No current facility-administered medications for this visit.    Allergies:   Atorvastatin, Crestor [rosuvastatin calcium], Enalapril, Lisinopril, Losartan, and Telmisartan   Social History:  The patient  reports that he quit smoking about 14 years ago. His smoking use included cigarettes. He has a 47.00 pack-year smoking history. He has never used smokeless tobacco. He reports current alcohol use. He reports that he does not use drugs.   Family History:  The patient's family history includes Gastric cancer in his mother; Heart attack (age of onset: 65) in his brother; Peripheral vascular disease in his father; Throat cancer in his father.    ROS:  Please see the history of present illness.   Otherwise, review of systems is positive for none.   All other systems are reviewed and negative.    PHYSICAL EXAM: VS:  BP (!) 106/54    Pulse 61    Ht 5\' 10"  (1.778 m)    Wt 150 lb 6.4 oz (68.2 kg)    SpO2 95%    BMI 21.58 kg/m  , BMI Body mass index is 21.58 kg/m. GEN: Well nourished, well developed, in no acute distress  HEENT: normal  Neck: no JVD, carotid bruits, or masses Cardiac: RRR; no murmurs, rubs, or gallops,no edema  Respiratory:  clear to auscultation bilaterally, normal work of breathing GI: soft, nontender, nondistended, + BS MS: no deformity or atrophy  Skin: warm and dry, device pocket is well healed Neuro:  Strength and sensation are intact Psych: euthymic mood, full affect  EKG:  EKG is not ordered today. Personal review of the ekg ordered 04/19/21 shows sinus rhythm, first-degree AV block, right bundle branch block  Device interrogation is reviewed today in detail.  See PaceArt for details.   Recent Labs: 04/19/2021: ALT 14; BUN 43; Creatinine, Ser 2.38; Potassium 5.4; Sodium 138; TSH 3.900    Lipid Panel     Component Value Date/Time   CHOL 123 04/19/2021 1000   TRIG 83 04/19/2021 1000   HDL 49  04/19/2021 1000   CHOLHDL 2.5 04/19/2021 1000   VLDL 17 04/19/2021 1000   LDLCALC 57 04/19/2021 1000     Wt Readings from Last 3 Encounters:  05/14/21 150 lb 6.4 oz (68.2 kg)  04/19/21 153 lb 6.4 oz (69.6 kg)  04/18/21 154 lb 3.2 oz (69.9 kg)      Other studies Reviewed: Additional studies/ records that were reviewed today include: TTE 10/18/20  Review of the above records today demonstrates:   1. Left ventricular ejection fraction, by estimation, is 30 to 35%. The  left ventricle has moderately decreased function. The left ventricle  demonstrates regional wall motion abnormalities with basal to mid inferior  and basal to mid inferolateral  akinesis. The anterolateral wall is hypokinetic. The left ventricular  internal cavity size was mildly dilated. Left ventricular diastolic  parameters are consistent with Grade I diastolic dysfunction (impaired  relaxation).   2. Right ventricular systolic function is mildly reduced. The right  ventricular size is normal. There is mildly elevated pulmonary artery  systolic pressure. The estimated right ventricular systolic pressure is  26.3 mmHg.   3. Left atrial size was moderately dilated.   4. The mitral valve is abnormal. Moderate mitral valve regurgitation,  suspect infarct-related MR with wall motion abnormalities. No evidence of  mitral stenosis.   5. The aortic valve is tricuspid. Aortic valve regurgitation is not  visualized. Mild aortic valve sclerosis is present, with no evidence of  aortic valve stenosis.   6. The inferior vena cava is normal in size with <50% respiratory  variability, suggesting right atrial pressure of 8 mmHg.    ASSESSMENT AND PLAN:  1.  Chronic systolic heart failure due to ischemic cardiomyopathy: Status post Medtronic ICD.  Currently on Lasix 40 mg daily, eplerenone 25 mg daily, Coreg 12.5 mg twice daily.  He has a high burden of right ventricular pacing.  Due to that, we Serenna Deroy reduce Coreg to 6.25 mg to  see if we can reduce his RV pacing.  If not, device upgrade may be reasonable.  He does have CKD stage III and thus trying to avoid giving IV contrast with device upgrade.  2.  Coronary artery disease: DES to the ramus in 2018, DES to [place] in 2021.  Continue Plavix per primary cardiology.  3.  Hyperlipidemia:  Continue simvastatin per primary cardiology  4.  Ventricular tachycardia: Currently on amiodarone 100 mg daily.  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  Would continue pulmonary follow-up.  Case discussed with primary cardiology.  Current medicines are reviewed at length with the patient today.   The patient does not have concerns regarding his medicines.  The following changes were made today:  decrease coreg  Labs/ tests ordered today include:  No orders of the defined types were placed in this encounter.    Disposition:   FU with Amela Handley 1 year  Signed, Deretha Ertle Meredith Leeds, MD  05/14/2021 12:10 PM     [place] [address] [phone] (office) [phone] (fax)

## 2021-05-14 ENCOUNTER — Other Ambulatory Visit: Payer: Self-pay

## 2021-05-14 ENCOUNTER — Ambulatory Visit (INDEPENDENT_AMBULATORY_CARE_PROVIDER_SITE_OTHER): Payer: Medicare Other | Admitting: Cardiology

## 2021-05-14 ENCOUNTER — Encounter: Payer: Self-pay | Admitting: Cardiology

## 2021-05-14 VITALS — BP 106/54 | HR 61 | Ht 70.0 in | Wt 150.4 lb

## 2021-05-14 DIAGNOSIS — I5022 Chronic systolic (congestive) heart failure: Secondary | ICD-10-CM | POA: Diagnosis not present

## 2021-05-14 MED ORDER — CARVEDILOL 6.25 MG PO TABS: 6.2500 mg | ORAL_TABLET | Freq: Two times a day (BID) | ORAL | 3 refills | Status: DC

## 2021-05-14 NOTE — Patient Instructions (Addendum)
Medication Instructions:  Your physician has recommended you make the following change in your medication:  DECREASE Carvedilol to 6.25 mg twice daily  *If you need a refill on your cardiac medications before your next appointment, please call your pharmacy*   Lab Work: None ordered If you have labs (blood work) drawn today and your tests are completely normal, you will receive your results only by: [place] (if you have MyChart) OR A paper copy in the mail If you have any lab test that is abnormal or we need to change your treatment, we will call you to review the results.   Testing/Procedures: None ordered   Follow-Up: At [facility], you and your health needs are our priority.  As part of our continuing mission to provide you with exceptional heart care, we have created designated Provider Care Teams.  These Care Teams include your primary Cardiologist (physician) and Advanced Practice Providers (APPs -  Physician Assistants and Nurse Practitioners) who all work together to provide you with the care you need, when you need it.  Remote monitoring is used to monitor your Pacemaker or ICD from home. This monitoring reduces the number of office visits required to check your device to one time per year. It allows [place] to keep an eye on the functioning of your device to ensure it is working properly. You are scheduled for a device check from home on 08/15/2021. You may send your transmission at any time that day. If you have a wireless device, the transmission will be sent automatically. After your physician reviews your transmission, you will receive a postcard with your next transmission date.  Your next appointment:   1 year(s)  The format for your next appointment:   In Person  Provider:   Allegra Lai, MD   Thank you for choosing [facility]!!   Trinidad Curet, RN [phone]    Other Instructions

## 2021-05-21 ENCOUNTER — Encounter (HOSPITAL_COMMUNITY): Payer: Self-pay | Admitting: Cardiology

## 2021-05-25 ENCOUNTER — Telehealth: Payer: Self-pay

## 2021-05-25 NOTE — Telephone Encounter (Signed)
° °  Primary Cardiologist: Loralie Champagne, MD  Chart reviewed as part of pre-operative protocol coverage. Given past medical history and time since last visit, based on ACC/AHA guidelines, Gabrielle Mester would be at acceptable risk for the planned procedure without further cardiovascular testing.   I will route this recommendation to the requesting party via Epic fax function and remove from pre-op pool.  Please call with questions.  Jossie Ng. Clemons Salvucci NP-C    05/25/2021, 10:08 AM [address] Office [phone] Fax [phone]

## 2021-05-25 NOTE — Telephone Encounter (Signed)
° °  Pre-operative Risk Assessment    Patient Name: Nathan Pruitt  DOB: 07-27-1945 MRN: [id number]      Request for Surgical Clearance    Procedure:   Colonoscopy  Date of Surgery:  Clearance 06/14/21                                 Surgeon:  Not Indicated Surgeon's Group or Practice Name:  [facility] Phone number:  [phone] Fax number:  [phone]   Type of Clearance Requested:   - Medical    Type of Anesthesia:  Not Indicated   Additional requests/questions:   Patient has a defibrillator. Please advise protocol during this procedure  Signed, Toni Arthurs   05/25/2021, 8:10 AM

## 2021-05-31 ENCOUNTER — Telehealth: Payer: Self-pay

## 2021-05-31 NOTE — Telephone Encounter (Signed)
-----   Message from Michae Kava, [place] sent at 05/31/2021 11:49 AM EST ----- Regarding: DEVICE CLEARANCE NEEDED? Hi ladies,   So we got a fax today on a clearance that looks like we have cleared the pt. However, on the fax today, they circled on there about device clearance it seems. My thought is that no-one ever sent anything to you all for clearance would be my guess. If you can please review this pt for device clearance and reply to requesting office. You can see the clearance in Epic date 05/25/21. This is for a colonoscopy with [facility].   I will bring the fax down to you guys as well.   Thank you  Arbie Cookey

## 2021-05-31 NOTE — Telephone Encounter (Signed)
[facility]   Patient Information:  Patient: Nathan Pruitt  MRN: [id number]  Date of Birth: 09-04-1945      Planned Procedure:  Colonoscopy  Surgeon:  [facility] Date of Procedure:  06/14/21    Device Information:   Clinic EP Physician:   Dr. Allegra Lai Device Type:  Pacemaker and Defibrillator Manufacturer and Phone #:  Medtronic: [phone] Pacemaker Dependent?:  No Date of Last Device Check:  05/14/21        Normal Device Function?:  Yes     Electrophysiologist's Recommendations:   Have magnet available. Provide continuous ECG monitoring when magnet is used or reprogramming is to be performed.  Procedure may interfere with device function.  Magnet should be placed over device during procedure.  Per Device Clinic Standing Orders, Willadean Carol  05/31/2021 3:20 PM

## 2021-06-07 ENCOUNTER — Other Ambulatory Visit (HOSPITAL_COMMUNITY): Payer: Self-pay | Admitting: Cardiology

## 2021-06-08 ENCOUNTER — Ambulatory Visit: Payer: Medicare Other | Admitting: Pulmonary Disease

## 2021-06-21 ENCOUNTER — Encounter: Payer: Medicare Other | Admitting: Diagnostic Neuroimaging

## 2021-06-21 ENCOUNTER — Ambulatory Visit (INDEPENDENT_AMBULATORY_CARE_PROVIDER_SITE_OTHER): Payer: Medicare Other | Admitting: Diagnostic Neuroimaging

## 2021-06-21 DIAGNOSIS — M79602 Pain in left arm: Secondary | ICD-10-CM

## 2021-06-21 DIAGNOSIS — R200 Anesthesia of skin: Secondary | ICD-10-CM

## 2021-06-21 DIAGNOSIS — Z0289 Encounter for other administrative examinations: Secondary | ICD-10-CM

## 2021-06-21 NOTE — Procedures (Signed)
° °  [facility]  NCS (NERVE CONDUCTION STUDY) WITH EMG (ELECTROMYOGRAPHY) REPORT   STUDY DATE: 06/21/21 PATIENT NAME: Nathan Pruitt DOB: 1946-05-04 MRN: [id number]  ORDERING CLINICIAN: Andrey Spearman, MD   TECHNOLOGIST: Sherre Scarlet ELECTROMYOGRAPHER: Earlean Polka. Delton Stelle, MD  CLINICAL INFORMATION: 76 year old male with left arm pain and bilateral hand numbness.  FINDINGS: NERVE CONDUCTION STUDY:  Left median and left ulnar motor responses are normal.  Left median and left ulnar sensory responses are normal.  Left ulnar F wave latency is normal.   NEEDLE ELECTROMYOGRAPHY:  Needle examination of left upper extremity is normal.    IMPRESSION:   Normal study.  No electrodiagnostic evidence of large fiber neuropathy at this time.   INTERPRETING PHYSICIAN:  Penni Bombard, MD Certified in Neurology, Neurophysiology and Neuroimaging  [facility] [address] [place], [place] [address] [phone]  [facility]    Nerve / Sites Muscle Latency Ref. Amplitude Ref. Rel Amp Segments Distance Velocity Ref. Area    ms ms mV mV %  cm m/s m/s mVms  L Median - APB     Wrist APB 4.0 ?4.4 4.7 ?4.0 100 Wrist - APB 7   20.1     Upper arm APB 8.4  4.5  96.7 Upper arm - Wrist 24 54 ?49 18.3  L Ulnar - ADM     Wrist ADM 3.2 ?3.3 6.7 ?6.0 100 Wrist - ADM 7   24.3     B.Elbow ADM 7.2  6.5  97.2 B.Elbow - Wrist 21 52 ?49 23.7     A.Elbow ADM 9.1  6.4  97.7 A.Elbow - B.Elbow 10 52 ?49 23.7         SNC    Nerve / Sites Rec. Site Peak Lat Ref.  Amp Ref. Segments Distance    ms ms V V  cm  L Median - Orthodromic (Dig II, Mid palm)     Dig II Wrist 3.1 ?3.4 15 ?10 Dig II - Wrist 13  L Ulnar - Orthodromic, (Dig V, Mid palm)     Dig V Wrist 3.0 ?3.1 7 ?5 Dig V - Wrist 75         F  Wave    Nerve F Lat Ref.   ms ms  L Ulnar - ADM 31.7 ?32.0       EMG Summary Table    Spontaneous MUAP Recruitment  Muscle IA Fib PSW Fasc Other Amp Dur.  Poly Pattern  L. Deltoid Normal None None None _______ Normal Normal Normal Normal  L. Biceps brachii Normal None None None _______ Normal Normal Normal Normal  L. Triceps brachii Normal None None None _______ Normal Normal Normal Normal  L. Flexor carpi radialis Normal None None None _______ Normal Normal Normal Normal  L. First dorsal interosseous Normal None None None _______ Normal Normal Normal Normal

## 2021-06-25 ENCOUNTER — Ambulatory Visit (HOSPITAL_COMMUNITY): Payer: Medicare Other

## 2021-07-15 ENCOUNTER — Encounter: Payer: Self-pay | Admitting: Internal Medicine

## 2021-07-16 ENCOUNTER — Ambulatory Visit (INDEPENDENT_AMBULATORY_CARE_PROVIDER_SITE_OTHER): Payer: Medicare Other

## 2021-07-16 DIAGNOSIS — I255 Ischemic cardiomyopathy: Secondary | ICD-10-CM | POA: Diagnosis not present

## 2021-07-16 NOTE — Telephone Encounter (Signed)
I spoke with the patient to let him know we did get his transmission from his new monitor. I let him know we do not have control over fedex or my chart messages. He verbalized understanding and thanked me for the call. ?

## 2021-07-17 LAB — CUP PACEART REMOTE DEVICE CHECK
Battery Remaining Longevity: 48 mo
Battery Voltage: 2.97 V
Brady Statistic AP VP Percent: 0.42 %
Brady Statistic AP VS Percent: 0.07 %
Brady Statistic AS VP Percent: 46.94 %
Brady Statistic AS VS Percent: 52.56 %
Brady Statistic RA Percent Paced: 0.53 %
Brady Statistic RV Percent Paced: 46.26 %
Date Time Interrogation Session: 20230312164115
HighPow Impedance: 35 Ohm
HighPow Impedance: 46 Ohm
Implantable Lead Implant Date: 20100520
Implantable Lead Implant Date: 20100520
Implantable Lead Location: 753859
Implantable Lead Location: 753860
Implantable Lead Model: 5076
Implantable Lead Model: 6947
Implantable Pulse Generator Implant Date: 20180710
Lead Channel Impedance Value: 247 Ohm
Lead Channel Impedance Value: 304 Ohm
Lead Channel Impedance Value: 380 Ohm
Lead Channel Pacing Threshold Amplitude: 0.5 V
Lead Channel Pacing Threshold Amplitude: 1.75 V
Lead Channel Pacing Threshold Pulse Width: 0.4 ms
Lead Channel Pacing Threshold Pulse Width: 0.4 ms
Lead Channel Sensing Intrinsic Amplitude: 0.625 mV
Lead Channel Sensing Intrinsic Amplitude: 0.625 mV
Lead Channel Sensing Intrinsic Amplitude: 4.875 mV
Lead Channel Sensing Intrinsic Amplitude: 4.875 mV
Lead Channel Setting Pacing Amplitude: 1.5 V
Lead Channel Setting Pacing Amplitude: 3.75 V
Lead Channel Setting Pacing Pulse Width: 0.4 ms
Lead Channel Setting Sensing Sensitivity: 0.3 mV

## 2021-07-18 ENCOUNTER — Other Ambulatory Visit (HOSPITAL_COMMUNITY): Payer: Self-pay | Admitting: Cardiology

## 2021-07-19 ENCOUNTER — Ambulatory Visit (HOSPITAL_COMMUNITY)
Admission: RE | Admit: 2021-07-19 | Discharge: 2021-07-19 | Disposition: A | Discharge status: Home / Self Care | Payer: Medicare Other | Source: Ambulatory Visit | Attending: Cardiology | Admitting: Cardiology

## 2021-07-19 ENCOUNTER — Encounter (HOSPITAL_COMMUNITY): Payer: Self-pay | Admitting: Cardiology

## 2021-07-19 ENCOUNTER — Other Ambulatory Visit: Payer: Self-pay

## 2021-07-19 VITALS — BP 120/70 | HR 56 | Wt 148.8 lb

## 2021-07-19 DIAGNOSIS — N183 Chronic kidney disease, stage 3 unspecified: Secondary | ICD-10-CM | POA: Diagnosis not present

## 2021-07-19 DIAGNOSIS — Z09 Encounter for follow-up examination after completed treatment for conditions other than malignant neoplasm: Secondary | ICD-10-CM | POA: Diagnosis not present

## 2021-07-19 DIAGNOSIS — I130 Hypertensive heart and chronic kidney disease with heart failure and stage 1 through stage 4 chronic kidney disease, or unspecified chronic kidney disease: Secondary | ICD-10-CM | POA: Insufficient documentation

## 2021-07-19 DIAGNOSIS — J84112 Idiopathic pulmonary fibrosis: Secondary | ICD-10-CM | POA: Diagnosis not present

## 2021-07-19 DIAGNOSIS — Z8616 Personal history of COVID-19: Secondary | ICD-10-CM | POA: Diagnosis not present

## 2021-07-19 DIAGNOSIS — Z7901 Long term (current) use of anticoagulants: Secondary | ICD-10-CM | POA: Diagnosis not present

## 2021-07-19 DIAGNOSIS — I2510 Atherosclerotic heart disease of native coronary artery without angina pectoris: Secondary | ICD-10-CM | POA: Diagnosis not present

## 2021-07-19 DIAGNOSIS — Z7902 Long term (current) use of antithrombotics/antiplatelets: Secondary | ICD-10-CM | POA: Diagnosis not present

## 2021-07-19 DIAGNOSIS — E039 Hypothyroidism, unspecified: Secondary | ICD-10-CM | POA: Diagnosis not present

## 2021-07-19 DIAGNOSIS — Z79899 Other long term (current) drug therapy: Secondary | ICD-10-CM | POA: Insufficient documentation

## 2021-07-19 DIAGNOSIS — I5022 Chronic systolic (congestive) heart failure: Secondary | ICD-10-CM

## 2021-07-19 DIAGNOSIS — I714 Abdominal aortic aneurysm, without rupture, unspecified: Secondary | ICD-10-CM | POA: Insufficient documentation

## 2021-07-19 DIAGNOSIS — Z95810 Presence of automatic (implantable) cardiac defibrillator: Secondary | ICD-10-CM | POA: Diagnosis not present

## 2021-07-19 DIAGNOSIS — I255 Ischemic cardiomyopathy: Secondary | ICD-10-CM | POA: Insufficient documentation

## 2021-07-19 DIAGNOSIS — E785 Hyperlipidemia, unspecified: Secondary | ICD-10-CM | POA: Diagnosis not present

## 2021-07-19 MED ORDER — LEVOTHYROXINE SODIUM 25 MCG PO TABS: 25.0000 ug | ORAL_TABLET | Freq: Every day | ORAL | 3 refills | Status: DC

## 2021-07-19 NOTE — Progress Notes (Signed)
?  ? ? ? ?ID:  Nathan Pruitt, DOB Dec 23, 1945, MRN [id number]   ?Provider location: [facility] ?Type of Visit: Established patient ? ?PCP:  Ronita Hipps, MD  ?Cardiologist:  Dr. Aundra Dubin ?  ?History of Present Illness: ?76 y.o. with a history of CAD, PAD, ischemic CMP, and VT s/p ICD. ?  ?Echo in 2/17 showed EF 40% and regional WMAs. ?  ?In 3/18, he developed episodes of VT as well as increased dyspnea.  LHC/RHC was done, showing new occlusion of a large ramus (CTO, probably a month or so old at time of cath) and old CTO RCA with collaterals.  He had DES x 3 to ramus.  Filling pressures were optimized on RHC.  Amiodarone was increased to 200 mg daily from 100 mg daily.  No VT since PCI.  ?  ?Echo in 6/19 showed EF 35-40% with wall motion abnormalities. Echo in 1/21 showed EF 40-45% with basal to mid inferolateral and inferior akinesis, mild MR, normal RV, PASP 35 mmHg.  ? ?LHC/RHC in 9/21 with normal filling pressures and cardiac output but 95% OM1 stenosis.  This was treated with DES to OM1.  ? ?He has been following with Dr. Vaughan Browner with idiopathic pulmonary fibrosis.  I was concerned that he could have amiodarone -related lung toxicity but Dr. Vaughan Browner did not think this was very likely.  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   ?  ?Labs drawn in 1/22 showed K up to 5.9 and creatinine 4.  He was sent to the ER at [facility].  He was found to have COVID-19 and was admitted. He had a productive cough and sore throat.  He was thought to be dehydrated and got IV fluid.  Creatinine 2.3 at discharge.  Lasix, valsartan, and eplerenone were stopped.  Other meds were continued. ? ?Echo in 6/22 showed EF 30-35%, basal-mid inferior and inferolateral akinesis, anterolateral hypokinesis, moderate MR (appears infarct-related), mildly decreased RV function.  ? ?He returns for followup of CHF.  Using oxygen when he goes shopping.  Breathing is unchanged, stable dyspnea with  moderate exertion.  No chest pain.  No orthopnea/PND.  Weight is down 5 lbs.  He saw Dr. Curt Bears and Coreg was decreased to 6.25 mg bid to try to decrease RV pacing percentage.   ? ?[employer] device interrogation: RV pacing 45% of the time.  Fluid index < threshold.  ?  ?Labs (11/10): K 4.9, creatinine 1.4, LDL 70, HDL 31, LFTs normal ?Labs (3/11): K 4.4, creatinine 1.3, LDL 61, HDL 35 ?Labs (7/11): LDL 62, HDL 23 ?Labs (1/12): K 4.8, LFTs normal, creatinine 1.44, LDL 69, HDL 33 ?Labs (7/12): LFTs normal, LDL 81, HDL 36, HCT normal, K 4.3, creatinine 1.3, BNP 116, TSH normal ?Labs (1/13): LDL 39, HDl 31, LFTs normal, TSH  ?Labs (7/13): TSH normal, LFTs normal, LDL 69, HDL 40 ?Labs (8/13): K 4.5, creatinine 1.6 ?Labs (10/13): K 4.4, creatinine 1.4 ?Labs (3/14): LDL 108, HDL 30, LFTs normal ?Labs (9/14): K 4.2, creatinine 1.6, LFTs normal, LDL 114, TSH 10.6 (elevated), free T4/free T3 normal ?Labs (2/15): K 4.6, creatinine 1.5, LFTs normal, TSH 7.6 (elevated), free T4 and free T3 normal ?Labs (3/15): K 3.6, creatinine 1.5, LFTs normal, TSH normal ?Labs (9/15): K 4.3, creatinine 1.6, LFTs normal, TSH normal, LDL 115, HDL 24 ?Labs (10/16): K 4.5, creatinine 1.5, LDL 90, HDL 35, hgb 12.8 ?Labs (11/16): Low free T3, normal TSH and free  T4 ?Labs (12/16): K 5, creatinine 1.8, LFTs normal, TSH 8.6 (mildly elevated) ?Labs (1/17): Free T4/TSH/free T3 normal, LFTs normal ?Labs (2/17): K 4.4, creatinine 2.4, LDL 131, HDL 34 ?Labs (4/17): K 4.2, creatinine 1.3, LDL 67, HDL 35 ?Labs (9/17): K 4.9, creatinine 2.2, LDL 54, HDL 35, free T3 and T4 normal, hgb 12 ?Labs (4/18): K 5, creatinine 1.78, LFTs normal, TSH elevated but free T3 and free T4 normal.  ?Labs (6/18): LDL 52, HDL 35, TSH elevated 6.8, LFTs normal ?Labs (7/18): K 4.3, creatinine 1.68 ?Labs (10/18): TSH 5.6, free T3 normal, free T4 normal, LFTs normal, K 4.9, creatinine 1.4 ?Labs (11/18): K 4.5, creatinine 1.46, LFTs normal ?Labs (4/19): K 4.8, creatinine 1.6, LFTs  normal ?Labs (6/19): TSH slightly elevated, free T3/T4 both normal, LFTs normal ?Labs (7/19): K 4.3, creatinine 1.3 ?Labs (10/19): LFTs normal, LDL 58, TGs 165, TSH 6.6 but free T4 and free T3 normal ?Labs (1/20): K 4.7, creatinine 1.7 ?Labs (2/20): LFTs normal ?Labs (4/20): K 4.6, creatinine 1.4, LDL 77, HDL 36, hgb 12.2, TSH mild elevation 7.4, free T4 and free T3 normal.  ?Labs (7/20): K 3.8, creatinine 1.6, hgb 12.2, LDL 81, TGs 158, HDL 35, TSH 7.7 (mildly elevated), LFTs normal ?Labs (11/20): K 4.5, creatinine 1.7, NT-proBNP 1358, TSH 5.6 (mild elevation), LDL 88, LFTs normal ?Labs (4/21): K 4.3, creatinine 1.6, hgb 12.4, LFTs, normal, TGs 105, LDL 81, HDL 40, TSH 5.6 (mildly elevated), free T3 and free T4 normal ?Labs (6/21): K 4.7, creatinine 1.9, AST/ALT normal, TSH mildly elevated at 7.9, free T3 and T4 normal, LDL 87, TGs 118 ?Labs (10/21): K 4.4, creatinine 1.8, LFTs normal, LDL 89, pro-BNP 1830, LFTs normal ?Labs (11/21): ESR 48, K 4.6, creatinine 1.96 ?Labs (12/21): ANA negative ?Labs (1/22): K 5.9, creatinine 4 => 2.3 ?Labs (2/22): K 4.6, creatinine 2.1 => 2.2, LDL 77, LFTs normal ?Labs (4/22): K 4.7,creatinine 2.7, TSH mildly elevated but free T3 and free T4 normal, hgb 10.2, LDL 67, LFTs normal ?Labs (5/22): creatinine 2.0 ?Labs (6/22): K 4.7, creatinine 2.4 ?Labs (9/22): K 4.4, creatinine 2.6, LFTs normal, hgb 9.9, LDL 62, TSH 5.94, free T4 normal, free T3 low.  ?Labs (10/22): TSH 4.99 ?Labs (12/22): LDL 57 ?Labs (3/23): TSH normal, free T3 and free T4 normal, K 4.7, creatinine 2.3, LFTs normal, LDL 61 ?  ?Allergies (verified):  ?No Known Drug Allergies ?  ?Past Medical History: ?1. Coronary artery disease. The patient reports history of silent MI in 1993.  This was likely an inferior MI (see below).  ?- The patient presented to [facility] in 5/10 with VT and mildly elevated cardiac enzymes. LHC (5/10):  Inferobasal dyskinesis with EF 35-40%.  There was chronic total occlusion of the mid RCA with  good collaterals.  Luminals LCA.  This did not appear to be an acute cause of the 5/10 event.  ?- LHC (3/18): Known occlusion of RCA with collaterals, new occlusion of ramus.  DES x 3 to ramus.  ?- LHC (9/21): Known occlusion RCA with collaterals, 95% OMI treated with DES.  ?2. Hypertension.  ?3. Hyperlipidemia: Intolerant of higher doses of simvastatin, Crestor, Lipitor, pravastatin. Zetia caused constipation.  ?4. Remote tobacco abuse with 47-pack-year history, quitting in August 2009.  ?5. Peripheral arterial disease.  ?- Status post left-to-right fem-fem bypass performed in [place] in March 2009.  ?- Status post redo left-to-right fem-fem bypass performed by Dr. Scot Dock at [facility] in 2009.  ?- ABIs normal 3/15, ABIs normal 3/16,  ABIs normal 3/17, ABIs normal 4/18, ABIs normal 4/19, ABIs normal 12/20.  ?6. Rheumatoid arthritis, on leflunomide.  ?7.  Ischemic cardiomyopathy:  EF 35-40% by LV-gram 5/10 with inferobasal dyskinesis.  Echo 5/10 showed EF 40% with mild LVH, no significant MR, inferobasal and posterobasal akinesis.  Echo (7/11): EF 50%, mild LVH, basal-mid inferoposterior akinesis.  Echo (7/12): EF 45-50% with basal anterolateral, basal posterior, and basal to mid inferior akinesis.  Echo (4/16): EF 40-45%, basal to mid inferolateral AK, basal inferior AK, basal to mid anterolateral HK.  Echo (2/17): EF 40% with basal to mid inferior akinesis, basal inferolateral aneurysm, mid inferolateral akinesis, basal anterolateral hypokinesis, normal RV size and systolic function, PASP 25 mmHg.  ?- Echo (3/18) with EF 30-35%, moderate LV dilation, moderate MR, mildly decreased RV systolic function, PASP 51 mmmHg. ?- RHC (3/18): mean RA 4, PA 38/12, mean PCWP 17, CI 2.2.  ?- Echo (6/19): EF 35-40%, inferior/inferolateral/anterolateral WMAs, moderate MR likely infarct-related, normal RV size with mildly decreased systolic function.  ?- Echo (1/21): EF 35-40% with wall motion abnormalities. Echo was done today  and reviewed, EF 40-45% with basal to mid inferolateral and inferior akinesis, mild MR, normal RV, PASP 35 mmHg.  ?- RHC (9/21): mean RA 4, PA 33/12, mean PCWP 9, CI 3.12 ?- Echo (6/22): EF 30-35%, basal-m

## 2021-07-19 NOTE — Progress Notes (Signed)
CSW met with pt in clinic to complete SDOH screen.  No SDOH concerns expressed at this time. ? ?Nathan Ny, LCSW ?Clinical Social Worker ?[facility] ?Desk#: [phone] ?Cell#: [phone] ? ?

## 2021-07-19 NOTE — Patient Instructions (Signed)
Thank you for your visit today. ? ?There has been no changes to your medications.  ? ?We have sent a message to Dr. Curt Bears office for a follow up appointment. His office will call you to arrange the appointment. ? ?Your physician recommends that you schedule a follow-up appointment in: 3 months. ? ?If you have any questions or concerns before your next appointment please send [place] a message through [place] or call our office at [phone].   ? ?TO LEAVE A MESSAGE FOR THE NURSE SELECT OPTION 2, PLEASE LEAVE A MESSAGE INCLUDING: ?YOUR NAME ?DATE OF BIRTH ?CALL BACK NUMBER ?REASON FOR CALL**this is important as we prioritize the call backs ? ?YOU WILL RECEIVE A CALL BACK THE SAME DAY AS LONG AS YOU CALL BEFORE 4:00 PM ? ?[facility] you and your health needs are our priority. As part of our continuing mission to provide you with exceptional heart care, we have created designated Provider Care Teams. These Care Teams include your primary Cardiologist (physician) and Advanced Practice Providers (APPs- Physician Assistants and Nurse Practitioners) who all work together to provide you with the care you need, when you need it.  ? ?You may see any of the following providers on your designated Care Team at your next follow up: ?Dr Glori Bickers ?Dr Loralie Champagne ?Darrick Grinder, NP ?Lyda Jester, PA ?Jessica Milford,NP ?Marlyce Huge, PA ?Audry Riles, PharmD ? ? ?Please be sure to bring in all your medications bottles to every appointment.  ? ? ?

## 2021-07-20 ENCOUNTER — Other Ambulatory Visit: Payer: Self-pay | Admitting: Pulmonary Disease

## 2021-07-20 DIAGNOSIS — J84112 Idiopathic pulmonary fibrosis: Secondary | ICD-10-CM

## 2021-07-23 ENCOUNTER — Other Ambulatory Visit: Payer: Self-pay

## 2021-07-23 ENCOUNTER — Encounter: Payer: Self-pay | Admitting: Pulmonary Disease

## 2021-07-23 ENCOUNTER — Ambulatory Visit: Payer: Medicare Other | Admitting: Pulmonary Disease

## 2021-07-23 ENCOUNTER — Ambulatory Visit (INDEPENDENT_AMBULATORY_CARE_PROVIDER_SITE_OTHER): Payer: Medicare Other | Admitting: Pulmonary Disease

## 2021-07-23 VITALS — BP 106/62 | HR 52 | Temp 97.9°F | Ht 70.0 in | Wt 149.2 lb

## 2021-07-23 DIAGNOSIS — J84112 Idiopathic pulmonary fibrosis: Secondary | ICD-10-CM

## 2021-07-23 LAB — PULMONARY FUNCTION TEST
DL/VA % pred: 58 %
DL/VA: 2.3 ml/min/mmHg/L
DLCO cor % pred: 48 %
DLCO cor: 12.15 ml/min/mmHg
DLCO unc % pred: 48 %
DLCO unc: 12.15 ml/min/mmHg
FEF 25-75 Post: 2.3 L/sec
FEF 25-75 Pre: 2.06 L/sec
FEF2575-%Change-Post: 11 %
FEF2575-%Pred-Post: 105 %
FEF2575-%Pred-Pre: 94 %
FEV1-%Change-Post: 3 %
FEV1-%Pred-Post: 89 %
FEV1-%Pred-Pre: 87 %
FEV1-Post: 2.72 L
FEV1-Pre: 2.64 L
FEV1FVC-%Change-Post: 4 %
FEV1FVC-%Pred-Pre: 104 %
FEV6-%Change-Post: 0 %
FEV6-%Pred-Post: 88 %
FEV6-%Pred-Pre: 88 %
FEV6-Post: 3.47 L
FEV6-Pre: 3.48 L
FEV6FVC-%Change-Post: 0 %
FEV6FVC-%Pred-Post: 106 %
FEV6FVC-%Pred-Pre: 106 %
FVC-%Change-Post: 0 %
FVC-%Pred-Post: 82 %
FVC-%Pred-Pre: 83 %
FVC-Post: 3.47 L
FVC-Pre: 3.5 L
Post FEV1/FVC ratio: 78 %
Post FEV6/FVC ratio: 100 %
Pre FEV1/FVC ratio: 75 %
Pre FEV6/FVC Ratio: 99 %
RV % pred: 77 %
RV: 1.99 L
TLC % pred: 79 %
TLC: 5.6 L

## 2021-07-23 NOTE — Progress Notes (Signed)
? ?      ?Nathan Pruitt    [id number]    May 28, 1945 ? ?Primary Care Physician:Holt, Gara Kroner, MD ? ?Referring Physician: Ronita Hipps, MD ?[facility] ?[place] [address],  ? ?Chief complaint: Follow-up for IPF ? ?HPI: ?76 year old ex-smoker with history of coronary artery disease, peripheral artery disease, ischemic cardiomyopathy, V. tach status post ICD, hypertension, hyperlipidemia. ? ?Referred by Dr. Aundra Dubin for evaluation of pulmonary fibrosis, interstitial lung disease.  He has history of V. tach in 2018, diagnosed with coronary artery disease status post stenting, cardiomyopathy with EF 40%.  He is being on amiodarone since 2018. ? ?He has progressive dyspnea and underwent repeat repeat cath in September 2021 with in-stent restenosis just treated with balloon angioplasty.  There is also concern for amiodarone toxicity given changes on PFTs and CT scan and amiodarone has been held and referred to pulmonary for further evaluation ? ?Has chronic dyspnea on exertion.  He admits that he is a slug and does not exercise very well.  Denies any cough, sputum production, fevers, chills ? ?We discussed antifibrotic's but he is not interested in trying.  Labs show positive hypersensitivity panel but he does not note any exposures.  We had advised getting his house and recommended to start ? ?Pets: No pets ?Occupation: Retired Dance movement psychotherapist ?Exposures: No known exposures.  No mold, hot tub, Jacuzzi.  No feather pillows or comforters ?Smoking history: 47-pack-year smoker.  Quit in 2013 ?Travel history: No significant travel history ?Relevant family history: No significant travel history of lung disease ? ?Interim history: ?Stable with regard to breathing.  He notes brittle nails and wonders if this is from lung disease. ? ?Outpatient Encounter Medications as of 07/23/2021  ?Medication Sig  ? amiodarone (PACERONE) 200 MG tablet TAKE 1/2 TABLET BY MOUTH DAILY  ? carvedilol (COREG) 6.25 MG tablet Take 1  tablet (6.25 mg total) by mouth 2 (two) times daily.  ? chlorhexidine (PERIDEX) 0.12 % solution Use as directed 15 mLs in the mouth or throat daily as needed (irritation).   ? clopidogrel (PLAVIX) 75 MG tablet TAKE 1 TABLET BY MOUTH DAILY WITH BREAKFAST.  ? eplerenone (INSPRA) 25 MG tablet TAKE 1 TABLET BY MOUTH EVERY DAY  ? fluticasone (FLONASE) 50 MCG/ACT nasal spray Place 1-2 sprays into both nostrils daily as needed for allergies or rhinitis.  ? furosemide (LASIX) 40 MG tablet Take 1 tablet (40 mg total) by mouth daily.  ? leflunomide (ARAVA) 20 MG tablet Take 20 mg by mouth daily.  ? levothyroxine (SYNTHROID) 25 MCG tablet Take 1 tablet (25 mcg total) by mouth daily before breakfast.  ? Melatonin 10 MG TABS Take 10 mg by mouth at bedtime as needed (sleep).   ? simvastatin (ZOCOR) 20 MG tablet TAKE 1 TABLET BY MOUTH EVERYDAY AT BEDTIME  ? ?No facility-administered encounter medications on file as of 07/23/2021.  ? ? ?Physical Exam: ?Blood pressure 106/62, pulse (!) 52, temperature 97.9 ?F (36.6 ?C), temperature source Oral, height '5\' 10"'$  (1.778 m), weight 149 lb 3.2 oz (67.7 kg), SpO2 100 %. ?Gen:      No acute distress ?HEENT:  EOMI, sclera anicteric ?Neck:     No masses; no thyromegaly ?Lungs:    Clear to auscultation bilaterally; normal respiratory effort ?CV:         Regular rate and rhythm; no murmurs ?Abd:      + bowel sounds; soft, non-tender; no palpable masses, no distension ?Ext:    No edema; adequate peripheral  perfusion ?Skin:      Warm and dry; no rash ?Neuro: alert and oriented x 3 ?Psych: normal mood and affect  ? ?Data Reviewed: ?Imaging: ?High-res CT chest 03/07/2020-patchy areas of groundglass attenuation with septal thickening, reticulation and traction bronchiectasis with basal gradient.  No honeycombing.  Probable UIP pattern.  ? ?High-res CT 04/16/2021-pulmonary fibrosis and probable UIP pattern.  Mildly progressive compared to prior ?I reviewed the images personally. ? ?PFTs: ?01/28/2020 ?FVC  3.28 [9 7%], FEV1 2.45 [10 9%], F/F 75, TLC 5.62 [39%], DLCO 13.54 [53%] ?Minimal restriction with moderate-severe diffusion defect ? ?07/23/2021 ?FVC 3.47 [82%], FEV1 2.72 [89%], F/F 78, TLC 5.60 [79%], DLCO 12.15 [48%] ?Minimal restriction moderate-severe diffusion defect ? ?Labs: ?Sed rate 03/15/2020-45 ? ?CTD serologies 04/06/2020-negative except for positive hypersensitivity panel for Thermoact. Saccharii ? 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  He does not have significant exposure history or connective tissue disease symptoms. 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 However I agree with stopping amiodarone if he does not absolutely need it for his history of V. tach. Hypersensitivity panel is positive but he does not note any exposures. ? ?CT and PFTs reviewed with slight progression on CT scan and worse diffusion impairment ?I had an extensive discussion with patient.  He is not a great candidate for lung biopsy or invasive procedures besides patient would like to avoid these. ?Discussed antifibrotic therapy but he would like to hold off due to expense.  He is skeptical of using antifibrotics and similar expensive medications if it does not improve his quality of life. ? ?Brittle nails ?Does not look obviously like clubbing to me.  Recommended trying multivitamins to see if that will improve ? ? ?Plan/Recommendations: ?High-resolution CT in 1 year ? ?Marshell Garfinkel MD ?[facility] ?07/23/2021, 10:25 AM ? ?CC: Ronita Hipps, MD ? ? ?

## 2021-07-23 NOTE — Patient Instructions (Signed)
I am glad you are stable with regard to breathing ?Your CT does show some mild progression of pulmonary fibrosis or scarring in the lung ?Regarding your nails I would advise getting a multivitamin over-the-counter to see if will help. ? ?We will order follow-up high-res CT in 1 year at [facility] and return to clinic after CT for review ? ?

## 2021-07-23 NOTE — Progress Notes (Signed)
PFT done today. 

## 2021-07-30 ENCOUNTER — Encounter: Payer: Self-pay | Admitting: Cardiology

## 2021-07-30 ENCOUNTER — Encounter: Payer: Self-pay | Admitting: *Deleted

## 2021-07-30 ENCOUNTER — Ambulatory Visit (INDEPENDENT_AMBULATORY_CARE_PROVIDER_SITE_OTHER): Payer: Medicare Other | Admitting: Cardiology

## 2021-07-30 ENCOUNTER — Other Ambulatory Visit: Payer: Self-pay

## 2021-07-30 ENCOUNTER — Other Ambulatory Visit: Payer: Self-pay | Admitting: Cardiology

## 2021-07-30 VITALS — BP 100/52 | HR 50 | Ht 69.5 in | Wt 150.8 lb

## 2021-07-30 DIAGNOSIS — I472 Ventricular tachycardia, unspecified: Secondary | ICD-10-CM | POA: Diagnosis not present

## 2021-07-30 DIAGNOSIS — I5022 Chronic systolic (congestive) heart failure: Secondary | ICD-10-CM

## 2021-07-30 LAB — CUP PACEART INCLINIC DEVICE CHECK
Battery Remaining Longevity: 47 mo
Battery Voltage: 2.97 V
Brady Statistic AP VP Percent: 1.69 %
Brady Statistic AP VS Percent: 0.08 %
Brady Statistic AS VP Percent: 30.78 %
Brady Statistic AS VS Percent: 67.46 %
Brady Statistic RA Percent Paced: 1.7 %
Brady Statistic RV Percent Paced: 31.71 %
Date Time Interrogation Session: 20230327143809
HighPow Impedance: 33 Ohm
HighPow Impedance: 44 Ohm
Implantable Lead Implant Date: 20100520
Implantable Lead Implant Date: 20100520
Implantable Lead Location: 753859
Implantable Lead Location: 753860
Implantable Lead Model: 5076
Implantable Lead Model: 6947
Implantable Pulse Generator Implant Date: 20180710
Lead Channel Impedance Value: 247 Ohm
Lead Channel Impedance Value: 266 Ohm
Lead Channel Impedance Value: 380 Ohm
Lead Channel Pacing Threshold Amplitude: 0.5 V
Lead Channel Pacing Threshold Amplitude: 1.75 V
Lead Channel Pacing Threshold Pulse Width: 0.4 ms
Lead Channel Pacing Threshold Pulse Width: 0.4 ms
Lead Channel Sensing Intrinsic Amplitude: 1.625 mV
Lead Channel Sensing Intrinsic Amplitude: 2 mV
Lead Channel Sensing Intrinsic Amplitude: 5.875 mV
Lead Channel Sensing Intrinsic Amplitude: 7.625 mV
Lead Channel Setting Pacing Amplitude: 1.5 V
Lead Channel Setting Pacing Amplitude: 3.5 V
Lead Channel Setting Pacing Pulse Width: 0.4 ms
Lead Channel Setting Sensing Sensitivity: 0.3 mV

## 2021-07-30 NOTE — Progress Notes (Signed)
Remote ICD transmission.   

## 2021-07-30 NOTE — Patient Instructions (Addendum)
Medication Instructions:  ?Your physician recommends that you continue on your current medications as directed. Please refer to the Current Medication list given to you today. ? ?*If you need a refill on your cardiac medications before your next appointment, please call your pharmacy* ? ? ?Lab Work: ?None ordered ?If you have labs (blood work) drawn today and your tests are completely normal, you will receive your results only by: ?MyChart Message (if you have MyChart) OR ?A paper copy in the mail ?If you have any lab test that is abnormal or we need to change your treatment, we will call you to review the results. ? ? ?Testing/Procedures: ?Your physician has recommended you upgrade your defibrillator to a BiVentricular device. Please see the instruction letters given to you today. ? The following dates are available (these are subject to change):  6/2, 6/7, 6/8, 6/21, 6/23, 6/28, 6/30 ? ? ?Follow-Up: ?At [facility], you and your health needs are our priority.  As part of our continuing mission to provide you with exceptional heart care, we have created designated Provider Care Teams.  These Care Teams include your primary Cardiologist (physician) and Advanced Practice Providers (APPs -  Physician Assistants and Nurse Practitioners) who all work together to provide you with the care you need, when you need it. ? ?Your next appointment:   ?2 week(s) after your BiVentricular upgrade ? ?The format for your next appointment:   ?In Person ? ?Provider:   ?Device clinic for a wound check ? ? ? ?Thank you for choosing [facility]!! ? ? ?Trinidad Curet, RN ?[phone] ? ? ?Other Instructions ?  ?  ?

## 2021-07-30 NOTE — Progress Notes (Signed)
? ?Electrophysiology Office Note ? ? ?Date:  07/30/2021  ? ?ID:  Nathan Pruitt, DOB 1945-12-26, MRN [id number] ? ?PCP:  Ronita Hipps, MD  ?Cardiologist:  Aundra Dubin ?Primary Electrophysiologist:  Bama Hanselman Meredith Leeds, MD   ? ?Chief Complaint: CHF ?  ?History of Present Illness: ?Nathan Pruitt is a 76 y.o. male who is being seen today for the evaluation of CHF at the request of Ronita Hipps, MD. Presenting today for electrophysiology evaluation. ? ?He has a history significant for coronary artery disease, hypertension, hyperlipidemia, remote tobacco abuse, PAD, rheumatoid arthritis, restrictive lung disease, ischemic cardiomyopathy.  He has atrial flutter and is status post isthmus ablation in 2010. ? ?In 2018 he developed episodes of ventricular tachycardia.  He had right and left heart catheterization that showed new occlusion of a ramus branch and CTO of the RCA with collaterals.  He had drug-eluting stent x3 to the ramus branch.  His amiodarone was increased.  He is status post Medtronic ICD. ? ?Today, denies symptoms of palpitations, chest pain, shortness of breath, orthopnea, PND, lower extremity edema, claudication, dizziness, presyncope, syncope, bleeding, or neurologic sequela. The patient is tolerating medications without difficulties.  He feels well.  He is able to do all of his daily activities, though he is doing the more slowly as he has baseline shortness of breath.  He is ventricular pacing 31% of the time.  He was initially ventricular pacing at up to 44% of the time, thus his carvedilol was decreased. ? ? ?Past Medical History:  ?Diagnosis Date  ? Abnormal PFTs 11/2009  ? FVC 74%, FEV1 80%, ratio 75%, TLC 78%, DLCO 68%. this suggests a mild restrictive and obstructive defect. pt did have a response to bronchodilator. these PFTs were significantly better than the report from Dr. Alcide Clever in [place] done prior.   ? AICD (automatic cardioverter/defibrillator) present 2010  ? Anxiety   ? Atrial  flutter ([place])   ? s/p isthmus ablation 5/10  ? CAD (coronary artery disease)   ? hx of silent MI in 1993. likely an inferior MI. hx of 2D cardiogram in 3/09 showing EF of 40%. hx of myoview in [place] 3/09-nml. presented to [facility] 5/10 with VT and mildly elevated cardiac enzymes LHC (5/10): inferobasal dyskinesis with EF 35-40%. was chronic total occlusion of mid RCA with good collaterals. luminals LCA. this does not appear to be an acute cause of the 5/10 event  ? Cancer [facility])   ? skin  ? Cervical radiculopathy   ? CHF (congestive heart failure) ([place])   ? Chronic lower back pain   ? CKD (chronic kidney disease)   ? HLD (hyperlipidemia)   ? HTN (hypertension)   ? Ischemic cardiomyopathy   ? EF 35-40% by LV-gram 5/10 with inferobasal dyskinesis. echo 5/10 showed EF 40% w/mild LVH, no sig. MR, inferobasal and posterobasal akinesis. echo (7/11): EF 50%, mild LVH, basal-mid inferoposterior akenesis  ? Migraine   ? "visual migraine; maybe 12/year" (07/16/2016)  ? PAD (peripheral artery disease) ([place])   ? s/p L-to-R fem-fem bypass performed by Dr. Scot Dock at [facility] in 2009   ? Rheumatoid arthritis ([place])   ? on leflunomide  ? 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 "silent"  ? Tobacco abuse   ? 47 pack year hx; quit 8/09  ? Ventricular tachycardia   ? likely scar-mediated. 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 He has duel chamber Medtronic ICD  ? ?Past Surgical History:  ?Procedure Laterality Date  ? CARDIAC  DEFIBRILLATOR PLACEMENT  09/22/2008  ? CATARACT EXTRACTION W/ INTRAOCULAR LENS  IMPLANT, BILATERAL Bilateral 05/2016 - 06/2016  ? CORONARY BALLOON ANGIOPLASTY N/A 01/28/2020  ? Procedure: CORONARY BALLOON ANGIOPLASTY;  Surgeon: Jettie Booze, MD;  Location: [place] [facility];  Service: Cardiovascular;  Laterality: N/A;  ? CORONARY CTO INTERVENTION  07/16/2016  ? CORONARY CTO INTERVENTION N/A 07/16/2016  ? Procedure: Coronary CTO Intervention;  Surgeon: Belva Crome, MD;  Location: [place] [facility];   Service: Cardiovascular;  Laterality: N/A;  ? EP IMPLANTABLE DEVICE  09/22/08  ? Medtronic, ICD Model Number:  [id number], ICD Serial Number: [id number]  ? FEMORAL ARTERY - FEMORAL ARTERY BYPASS GRAFT  march 2009  ? left to right bypass, first @ [facility], second at [facility] by Dr Scot Dock  ? ICD GENERATOR CHANGEOUT N/A 11/12/2016  ? Procedure: ICD Generator Changeout;  Surgeon: Thompson Grayer, MD;  Location: [place] [facility];  Service: Cardiovascular;  Laterality: N/A;  ? RIGHT/LEFT HEART CATH AND CORONARY ANGIOGRAPHY N/A 07/12/2016  ? Procedure: Right/Left Heart Cath and Coronary Angiography;  Surgeon: Larey Dresser, MD;  Location: [place] [facility];  Service: Cardiovascular;  Laterality: N/A;  ? RIGHT/LEFT HEART CATH AND CORONARY ANGIOGRAPHY N/A 01/28/2020  ? Procedure: RIGHT/LEFT HEART CATH AND CORONARY ANGIOGRAPHY;  Surgeon: Larey Dresser, MD;  Location: [place] [facility];  Service: Cardiovascular;  Laterality: N/A;  ? TONSILLECTOMY    ? VASECTOMY    ? VENTRICULAR ABLATION SURGERY  09/2008  ? ? ? ?Current Outpatient Medications  ?Medication Sig Dispense Refill  ? amiodarone (PACERONE) 200 MG tablet TAKE 1/2 TABLET BY MOUTH DAILY 45 tablet 7  ? carvedilol (COREG) 6.25 MG tablet Take 1 tablet (6.25 mg total) by mouth 2 (two) times daily. 180 tablet 3  ? chlorhexidine (PERIDEX) 0.12 % solution Use as directed 15 mLs in the mouth or throat daily as needed (irritation).   3  ? clopidogrel (PLAVIX) 75 MG tablet TAKE 1 TABLET BY MOUTH DAILY WITH BREAKFAST. 90 tablet 2  ? eplerenone (INSPRA) 25 MG tablet TAKE 1 TABLET BY MOUTH EVERY DAY 90 tablet 3  ? fluticasone (FLONASE) 50 MCG/ACT nasal spray Place 1-2 sprays into both nostrils daily as needed for allergies or rhinitis.    ? furosemide (LASIX) 40 MG tablet Take 1 tablet (40 mg total) by mouth daily. 90 tablet 3  ? leflunomide (ARAVA) 20 MG tablet Take 20 mg by mouth daily.    ? levothyroxine (SYNTHROID) 25 MCG tablet Take 1 tablet (25 mcg total)  by mouth daily before breakfast. 90 tablet 3  ? Melatonin 10 MG TABS Take 10 mg by mouth at bedtime as needed (sleep).     ? simvastatin (ZOCOR) 20 MG tablet TAKE 1 TABLET BY MOUTH EVERYDAY AT BEDTIME 90 tablet 3  ? ?No current facility-administered medications for this visit.  ? ? ?Allergies:   Atorvastatin, Crestor [rosuvastatin calcium], Enalapril, Lisinopril, Losartan, and Telmisartan  ? ?Social History:  The patient  reports that he quit smoking about 14 years ago. His smoking use included cigarettes. He has a 47.00 pack-year smoking history. He has never used smokeless tobacco. He reports current alcohol use. He reports that he does not use drugs.  ? ?Family History:  The patient's family history includes Gastric cancer in his mother; Heart attack (age of onset: 60) in his brother; Peripheral vascular disease in his father; Throat cancer in his father.  ? ?ROS:  Please see the history of present  illness.   Otherwise, review of systems is positive for none.   All other systems are reviewed and negative.  ? ?PHYSICAL EXAM: ?VS:  BP (!) 100/52   Pulse (!) 50   Ht 5' 9.5" (1.765 m)   Wt 150 lb 12.8 oz (68.4 kg)   SpO2 95%   BMI 21.95 kg/m?  , BMI Body mass index is 21.95 kg/m?. ?GEN: Well nourished, well developed, in no acute distress  ?HEENT: normal  ?Neck: no JVD, carotid bruits, or masses ?Cardiac: RRR; no murmurs, rubs, or gallops,no edema  ?Respiratory:  clear to auscultation bilaterally, normal work of breathing ?GI: soft, nontender, nondistended, + BS ?MS: no deformity or atrophy  ?Skin: warm and dry, device site well healed ?Neuro:  Strength and sensation are intact ?Psych: euthymic mood, full affect ? ?EKG:  EKG is not ordered today. ?Personal review of the ekg ordered 04/19/21 shows sinus rhythm, first-degree AV block, right bundle branch block, inferior infarct ? ?Personal review of the device interrogation today. Results in [place]  ? ? ?Recent Labs: ?04/19/2021: ALT 14; BUN 43; Creatinine, Ser  2.38; Potassium 5.4; Sodium 138; TSH 3.900  ? ? ?Lipid Panel  ?   ?Component Value Date/Time  ? CHOL 123 04/19/2021 1000  ? TRIG 83 04/19/2021 1000  ? HDL 49 04/19/2021 1000  ? CHOLHDL 2.5 04/19/2021 1000  ? VLDL 17

## 2021-07-31 ENCOUNTER — Encounter: Payer: Self-pay | Admitting: Cardiology

## 2021-08-01 ENCOUNTER — Other Ambulatory Visit: Payer: Self-pay | Admitting: Pulmonary Disease

## 2021-08-01 NOTE — Progress Notes (Signed)
Opened encounter in error  

## 2021-09-18 ENCOUNTER — Encounter: Payer: Self-pay | Admitting: Cardiology

## 2021-09-28 ENCOUNTER — Other Ambulatory Visit: Payer: Self-pay

## 2021-09-28 ENCOUNTER — Telehealth: Payer: Self-pay

## 2021-09-28 DIAGNOSIS — I472 Ventricular tachycardia, unspecified: Secondary | ICD-10-CM

## 2021-09-28 NOTE — Telephone Encounter (Signed)
Spoke to pt, went over Instructions. He will go to our [place] office for labs on 10/02/21. He is aware of procedure date/time 10/05/21.

## 2021-10-02 ENCOUNTER — Encounter: Payer: Self-pay | Admitting: Cardiology

## 2021-10-02 ENCOUNTER — Telehealth: Payer: Self-pay | Admitting: Cardiology

## 2021-10-02 LAB — CBC
Hematocrit: 32.6 % — ABNORMAL LOW (ref 37.5–51.0)
Hemoglobin: 11 g/dL — ABNORMAL LOW (ref 13.0–17.7)
MCH: 31.1 pg (ref 26.6–33.0)
MCHC: 33.7 g/dL (ref 31.5–35.7)
MCV: 92 fL (ref 79–97)
Platelets: 226 10*3/uL (ref 150–450)
RBC: 3.54 x10E6/uL — ABNORMAL LOW (ref 4.14–5.80)
RDW: 11.8 % (ref 11.6–15.4)
WBC: 6 10*3/uL (ref 3.4–10.8)

## 2021-10-02 LAB — BASIC METABOLIC PANEL
BUN/Creatinine Ratio: 17 (ref 10–24)
BUN: 41 mg/dL — ABNORMAL HIGH (ref 8–27)
CO2: 22 mmol/L (ref 20–29)
Calcium: 8.7 mg/dL (ref 8.6–10.2)
Chloride: 101 mmol/L (ref 96–106)
Creatinine, Ser: 2.4 mg/dL — ABNORMAL HIGH (ref 0.76–1.27)
Glucose: 90 mg/dL (ref 70–99)
Potassium: 5 mmol/L (ref 3.5–5.2)
Sodium: 137 mmol/L (ref 134–144)
eGFR: 27 mL/min/{1.73_m2} — ABNORMAL LOW (ref >59)

## 2021-10-02 NOTE — Telephone Encounter (Signed)
Patient is calling because he doesn't want to take the medication at the hospital for 6/2. He feels that its the same medication that he already taking and don't have to have to pay for that again. Please advise

## 2021-10-02 NOTE — Telephone Encounter (Signed)
Dr. Curt Bears answered pt via [facility]

## 2021-10-05 ENCOUNTER — Encounter (HOSPITAL_COMMUNITY): Payer: Self-pay | Admitting: Anesthesiology

## 2021-10-05 ENCOUNTER — Ambulatory Visit (HOSPITAL_COMMUNITY)
Admission: RE | Disposition: A | Discharge status: Home / Self Care | Payer: Self-pay | Source: Home / Self Care | Attending: Cardiology

## 2021-10-05 ENCOUNTER — Ambulatory Visit (HOSPITAL_COMMUNITY): Payer: Medicare Other

## 2021-10-05 ENCOUNTER — Other Ambulatory Visit: Payer: Self-pay

## 2021-10-05 ENCOUNTER — Ambulatory Visit (HOSPITAL_COMMUNITY)
Admission: RE | Admit: 2021-10-05 | Discharge: 2021-10-05 | Disposition: A | Discharge status: Home / Self Care | Payer: Medicare Other | Attending: Cardiology | Admitting: Cardiology

## 2021-10-05 DIAGNOSIS — Z4502 Encounter for adjustment and management of automatic implantable cardiac defibrillator: Secondary | ICD-10-CM | POA: Insufficient documentation

## 2021-10-05 DIAGNOSIS — I447 Left bundle-branch block, unspecified: Secondary | ICD-10-CM | POA: Diagnosis not present

## 2021-10-05 DIAGNOSIS — I130 Hypertensive heart and chronic kidney disease with heart failure and stage 1 through stage 4 chronic kidney disease, or unspecified chronic kidney disease: Secondary | ICD-10-CM | POA: Diagnosis not present

## 2021-10-05 DIAGNOSIS — I442 Atrioventricular block, complete: Secondary | ICD-10-CM | POA: Insufficient documentation

## 2021-10-05 DIAGNOSIS — I2510 Atherosclerotic heart disease of native coronary artery without angina pectoris: Secondary | ICD-10-CM | POA: Diagnosis not present

## 2021-10-05 DIAGNOSIS — Z87891 Personal history of nicotine dependence: Secondary | ICD-10-CM | POA: Insufficient documentation

## 2021-10-05 DIAGNOSIS — I255 Ischemic cardiomyopathy: Secondary | ICD-10-CM | POA: Insufficient documentation

## 2021-10-05 DIAGNOSIS — N189 Chronic kidney disease, unspecified: Secondary | ICD-10-CM | POA: Diagnosis not present

## 2021-10-05 DIAGNOSIS — Z955 Presence of coronary angioplasty implant and graft: Secondary | ICD-10-CM | POA: Diagnosis not present

## 2021-10-05 DIAGNOSIS — M069 Rheumatoid arthritis, unspecified: Secondary | ICD-10-CM | POA: Diagnosis not present

## 2021-10-05 DIAGNOSIS — I5022 Chronic systolic (congestive) heart failure: Secondary | ICD-10-CM

## 2021-10-05 DIAGNOSIS — E785 Hyperlipidemia, unspecified: Secondary | ICD-10-CM | POA: Insufficient documentation

## 2021-10-05 HISTORY — PX: BIV UPGRADE: EP1202

## 2021-10-05 SURGERY — BIV UPGRADE
Anesthesia: LOCAL

## 2021-10-05 MED ORDER — ACETAMINOPHEN 325 MG PO TABS: 325.0000 mg | ORAL_TABLET | ORAL | Status: DC | PRN

## 2021-10-05 MED ORDER — CEFAZOLIN SODIUM-DEXTROSE 1-4 GM/50ML-% IV SOLN
1.0000 g | Freq: Two times a day (BID) | INTRAVENOUS | Status: DC
  Administered 2021-10-05 (×2): 1 g via INTRAVENOUS
  Filled 2021-10-05 (×2): qty 50

## 2021-10-05 MED ORDER — CEFAZOLIN SODIUM-DEXTROSE 2-4 GM/100ML-% IV SOLN
INTRAVENOUS | Status: AC
  Filled 2021-10-05: qty 100

## 2021-10-05 MED ORDER — FENTANYL CITRATE (PF) 100 MCG/2ML IJ SOLN
INTRAMUSCULAR | Status: AC
  Filled 2021-10-05: qty 2

## 2021-10-05 MED ORDER — HEPARIN (PORCINE) IN NACL 1000-0.9 UT/500ML-% IV SOLN
INTRAVENOUS | Status: AC
  Filled 2021-10-05: qty 500

## 2021-10-05 MED ORDER — IOHEXOL 350 MG/ML SOLN
INTRAVENOUS | Status: DC | PRN
  Administered 2021-10-05: 17 mL via INTRAVENOUS

## 2021-10-05 MED ORDER — ONDANSETRON HCL 4 MG/2ML IJ SOLN: 4.0000 mg | Freq: Four times a day (QID) | INTRAMUSCULAR | Status: DC | PRN

## 2021-10-05 MED ORDER — SODIUM CHLORIDE 0.9 % IV SOLN: INTRAVENOUS | Status: DC

## 2021-10-05 MED ORDER — FENTANYL CITRATE (PF) 100 MCG/2ML IJ SOLN
INTRAMUSCULAR | Status: DC | PRN
  Administered 2021-10-05 (×3): 12.5 ug via INTRAVENOUS
  Administered 2021-10-05: 25 ug via INTRAVENOUS
  Administered 2021-10-05: 12.5 ug via INTRAVENOUS
  Administered 2021-10-05: 25 ug via INTRAVENOUS

## 2021-10-05 MED ORDER — HEPARIN (PORCINE) IN NACL 1000-0.9 UT/500ML-% IV SOLN
INTRAVENOUS | Status: DC | PRN
  Administered 2021-10-05: 500 mL

## 2021-10-05 MED ORDER — SODIUM CHLORIDE 0.9 % IV SOLN
INTRAVENOUS | Status: AC
  Filled 2021-10-05: qty 2

## 2021-10-05 MED ORDER — LIDOCAINE HCL 1 % IJ SOLN
INTRAMUSCULAR | Status: AC
  Filled 2021-10-05: qty 60

## 2021-10-05 MED ORDER — SODIUM CHLORIDE 0.9 % IV SOLN
80.0000 mg | INTRAVENOUS | Status: AC
  Administered 2021-10-05: 80 mg

## 2021-10-05 MED ORDER — MIDAZOLAM HCL 5 MG/5ML IJ SOLN
INTRAMUSCULAR | Status: DC | PRN
  Administered 2021-10-05: .5 mg via INTRAVENOUS
  Administered 2021-10-05: 1 mg via INTRAVENOUS
  Administered 2021-10-05: .5 mg via INTRAVENOUS
  Administered 2021-10-05: 1 mg via INTRAVENOUS
  Administered 2021-10-05 (×2): .5 mg via INTRAVENOUS

## 2021-10-05 MED ORDER — MIDAZOLAM HCL 5 MG/5ML IJ SOLN
INTRAMUSCULAR | Status: AC
  Filled 2021-10-05: qty 5

## 2021-10-05 MED ORDER — CEFAZOLIN SODIUM-DEXTROSE 2-4 GM/100ML-% IV SOLN
2.0000 g | INTRAVENOUS | Status: AC
  Administered 2021-10-05: 2 g via INTRAVENOUS

## 2021-10-05 MED ORDER — LIDOCAINE HCL (PF) 1 % IJ SOLN
INTRAMUSCULAR | Status: DC | PRN
  Administered 2021-10-05: 60 mL

## 2021-10-05 SURGICAL SUPPLY — 14 items
CABLE SURGICAL S-101-97-12 (CABLE) ×2 IMPLANT
CATH ATTAIN COM SURV 6250V-MB2 (CATHETERS) ×1 IMPLANT
GUIDEWIRE ANGLED .035X150CM (WIRE) ×2 IMPLANT
ICD CLARIA MRI DTMA1Q1 (ICD Generator) ×1 IMPLANT
KIT MICROPUNCTURE NIT STIFF (SHEATH) ×1 IMPLANT
LEAD ATTAIN PERFORMA S 4598-88 (Lead) ×1 IMPLANT
PAD DEFIB RADIO PHYSIO CONN (PAD) ×2 IMPLANT
POUCH AIGIS-R ANTIBACT ICD (Mesh General) ×2 IMPLANT
POUCH AIGIS-R ANTIBACT ICD LRG (Mesh General) IMPLANT
SHEATH 9.5FR PRELUDE SNAP 13 (SHEATH) ×1 IMPLANT
SHEATH PROBE COVER 6X72 (BAG) ×1 IMPLANT
SLITTER 6232ADJ (MISCELLANEOUS) ×1 IMPLANT
TRAY PACEMAKER INSERTION (PACKS) ×2 IMPLANT
WIRE ACUITY WHISPER EDS 4648 (WIRE) ×1 IMPLANT

## 2021-10-05 NOTE — Progress Notes (Signed)
Dr Curt Bears notified of post CXR

## 2021-10-05 NOTE — Discharge Instructions (Signed)
After Your ICD (Implantable Cardiac Defibrillator)   You have a Medtronic ICD  ACTIVITY Do not lift your arm above shoulder height for 1 week after your procedure. After 7 days, you may progress as below.  You should remove your sling 24 hours after your procedure, unless otherwise instructed by your provider.     Friday October 12, 2021  Saturday October 13, 2021 Sunday October 14, 2021 Monday October 15, 2021   Do not lift, push, pull, or carry anything over 10 pounds with the affected arm until 6 weeks (Friday November 16, 2021 ) after your procedure.   You may drive AFTER your wound check, unless you have been told otherwise by your provider.   Ask your healthcare provider when you can go back to work   INCISION/Dressing If you are on a blood thinner such as Coumadin, Xarelto, Eliquis, Plavix, or Pradaxa please confirm with your provider when this should be resumed. 6/4 am  If large square, outer bandage is left in place, this can be removed after 24 hours from your procedure. Do not remove steri-strips or glue as below.   Monitor your defibrillator site for redness, swelling, and drainage. Call the device clinic at [phone] if you experience these symptoms or fever/chills.  If your incision is sealed with Steri-strips or staples, you may shower 7 days after your procedure or when told by your provider. Do not remove the steri-strips or let the shower hit directly on your site. You may wash around your site with soap and water.    If you were discharged in a sling, please do not wear this during the day more than 48 hours after your surgery unless otherwise instructed. This may increase the risk of stiffness and soreness in your shoulder.   Avoid lotions, ointments, or perfumes over your incision until it is well-healed.  You may use a hot tub or a pool AFTER your wound check appointment if the incision is completely closed.  Your ICD is designed to protect you from life threatening heart  rhythms. Because of this, you may receive a shock.   1 shock with no symptoms:  Call the office during business hours. 1 shock with symptoms (chest pain, chest pressure, dizziness, lightheadedness, shortness of breath, overall feeling unwell):  Call 911. If you experience 2 or more shocks in 24 hours:  Call 911. If you receive a shock, you should not drive for 6 months per the [place] DMV IF you receive appropriate therapy from your ICD.   ICD Alerts:  Some alerts are vibratory and others beep. These are NOT emergencies. Please call our office to let us know. If this occurs at night or on weekends, it can wait until the next business day. Send a remote transmission.  If your device is capable of reading fluid status (for heart failure), you will be offered monthly monitoring to review this with you.   DEVICE MANAGEMENT Remote monitoring is used to monitor your ICD from home. This monitoring is scheduled every 91 days by our office. It allows [place] to keep an eye on the functioning of your device to ensure it is working properly. You will routinely see your Electrophysiologist annually (more often if necessary).   You should receive your ID card for your new device in 4-8 weeks. Keep this card with you at all times once received. Consider wearing a medical alert bracelet or necklace.  Your ICD  may be MRI compatible. This will be discussed at your  next office visit/wound check.  You should avoid contact with strong electric or magnetic fields.   Do not use amateur (ham) radio equipment or electric (arc) welding torches. MP3 player headphones with magnets should not be used. Some devices are safe to use if held at least 12 inches (30 cm) from your defibrillator. These include power tools, lawn mowers, and speakers. If you are unsure if something is safe to use, ask your health care provider.  When using your cell phone, hold it to the ear that is on the opposite side from the defibrillator. Do not leave  your cell phone in a pocket over the defibrillator.  You may safely use electric blankets, heating pads, computers, and microwave ovens.  Call the office right away if: You have chest pain. You feel more than one shock. You feel more short of breath than you have felt before. You feel more light-headed than you have felt before. Your incision starts to open up.  This information is not intended to replace advice given to you by your health care provider. Make sure you discuss any questions you have with your health care provider.

## 2021-10-05 NOTE — Anesthesia Preprocedure Evaluation (Signed)
Anesthesia Evaluation    Airway        Dental   Pulmonary former smoker,           Cardiovascular hypertension, + CAD, + Peripheral Vascular Disease and +CHF  + Cardiac Defibrillator   10/2020 ECHO: 1. Left ventricular ejection fraction, by estimation, is 30 to 35%. The  left ventricle has moderately decreased function. The left ventricle  demonstrates regional wall motion abnormalities with basal to mid inferior  and basal to mid inferolateral  akinesis. The anterolateral wall is hypokinetic. The left ventricular  internal cavity size was mildly dilated. Left ventricular diastolic  parameters are consistent with Grade I diastolic dysfunction (impaired  relaxation).  2. Right ventricular systolic function is mildly reduced. The right  ventricular size is normal. There is mildly elevated pulmonary artery  systolic pressure. The estimated right ventricular systolic pressure is  63.7 mmHg.  3. Left atrial size was moderately dilated.  4. The mitral valve is abnormal. Moderate mitral valve regurgitation,  suspect infarct-related MR with wall motion abnormalities. No evidence of  mitral stenosis.  5. The aortic valve is tricuspid. Aortic valve regurgitation is not  visualized. Mild aortic valve sclerosis is present, with no evidence of  aortic valve stenosis.  6. The inferior vena cava is normal in size with <50% respiratory  variability, suggesting right atrial pressure of 8 mmHg.   Neuro/Psych  Headaches, Anxiety  Neuromuscular disease (cervical radiculopathy)    GI/Hepatic negative GI ROS, Neg liver ROS,   Endo/Other    Renal/GU Renal disease     Musculoskeletal  (+) Arthritis , Osteoarthritis,    Abdominal   Peds  Hematology negative hematology ROS (+)   Anesthesia Other Findings   Reproductive/Obstetrics                            Anesthesia Physical Anesthesia Plan Anesthesia Quick  Evaluation

## 2021-10-05 NOTE — H&P (Signed)
Electrophysiology Office Note   Date:  10/05/2021   ID:  Whitt Auletta, DOB January 03, 1946, MRN [id number]  PCP:  Ronita Hipps, MD  Cardiologist:  Aundra Dubin Primary Electrophysiologist:  Kaiden Dardis Meredith Leeds, MD    Chief Complaint: CHF   History of Present Illness: Shaul Trautman is a 76 y.o. male who is being seen today for the evaluation of CHF at the request of No ref. provider found. Presenting today for electrophysiology evaluation.  He has a history significant for coronary artery disease, hypertension, hyperlipidemia, remote tobacco abuse, PAD, rheumatoid arthritis, restrictive lung disease, ischemic cardiomyopathy.  He has atrial flutter and is status post isthmus ablation in 2010.  In 2018 he developed episodes of ventricular tachycardia.  He had right and left heart catheterization that showed new occlusion of a ramus branch and CTO of the RCA with collaterals.  He had drug-eluting stent x3 to the ramus branch.  His amiodarone was increased.  He is status post Medtronic ICD.  Today, denies symptoms of palpitations, chest pain, shortness of breath, orthopnea, PND, lower extremity edema, claudication, dizziness, presyncope, syncope, bleeding, or neurologic sequela. The patient is tolerating medications without difficulties. Plan CRT-D upgrade today.    Past Medical History:  Diagnosis Date   Abnormal PFTs 11/2009   FVC 74%, FEV1 80%, ratio 75%, TLC 78%, DLCO 68%. this suggests a mild restrictive and obstructive defect. pt did have a response to bronchodilator. these PFTs were significantly better than the report from Dr. Alcide Clever in [place] done prior.    AICD (automatic cardioverter/defibrillator) present 2010   Anxiety    Atrial flutter (HCC)    s/p isthmus ablation 5/10   CAD (coronary artery disease)    hx of silent MI in 1993. likely an inferior MI. hx of 2D cardiogram in 3/09 showing EF of 40%. hx of myoview in [place] 3/09-nml. presented to [facility]/10 with VT and mildly elevated  cardiac enzymes LHC (5/10): inferobasal dyskinesis with EF 35-40%. was chronic total occlusion of mid RCA with good collaterals. luminals LCA. this does not appear to be an acute cause of the 5/10 event   Cancer ([place])    skin   Cervical radiculopathy    CHF (congestive heart failure) (HCC)    Chronic lower back pain    CKD (chronic kidney disease)    HLD (hyperlipidemia)    HTN (hypertension)    Ischemic cardiomyopathy    EF 35-40% by LV-gram 5/10 with inferobasal dyskinesis. echo 5/10 showed EF 40% w/mild LVH, no sig. MR, inferobasal and posterobasal akinesis. 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 Scot Dock at [facility] in 2009    Rheumatoid arthritis [facility])    on leflunomide   Silent myocardial infarction [facility]) 1993   "silent"   Tobacco abuse    47 pack year hx; quit 8/09   Ventricular tachycardia    likely scar-mediated. 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 He has duel chamber Medtronic ICD   Past Surgical History:  Procedure Laterality Date   CARDIAC DEFIBRILLATOR PLACEMENT  09/22/2008   CATARACT EXTRACTION W/ INTRAOCULAR LENS  IMPLANT, BILATERAL Bilateral 05/2016 - 06/2016   CORONARY BALLOON ANGIOPLASTY N/A 01/28/2020   Procedure: CORONARY BALLOON ANGIOPLASTY;  Surgeon: Jettie Booze, MD;  Location: [place] CV LAB;  Service: Cardiovascular;  Laterality: N/A;  CORONARY CTO INTERVENTION  07/16/2016   CORONARY CTO INTERVENTION N/A 07/16/2016   Procedure: Coronary CTO Intervention;  Surgeon: Belva Crome, MD;  Location: [place] CV LAB;  Service: Cardiovascular;  Laterality: N/A;   EP IMPLANTABLE DEVICE  09/22/08   Medtronic, ICD Model Number:  [id number], ICD Serial Number: [id number]   FEMORAL ARTERY - FEMORAL ARTERY BYPASS GRAFT  march 2009   left to right bypass, first @ [facility], second at [facility] by Dr Scot Dock   ICD GENERATOR CHANGEOUT N/A 11/12/2016   Procedure: ICD Generator Changeout;  Surgeon: Thompson Grayer, MD;  Location: [place] CV LAB;  Service: Cardiovascular;  Laterality: N/A;   RIGHT/LEFT HEART CATH AND CORONARY ANGIOGRAPHY N/A 07/12/2016   Procedure: Right/Left Heart Cath and Coronary Angiography;  Surgeon: Larey Dresser, MD;  Location: [place] [facility];  Service: Cardiovascular;  Laterality: N/A;   RIGHT/LEFT HEART CATH AND CORONARY ANGIOGRAPHY N/A 01/28/2020   Procedure: RIGHT/LEFT HEART CATH AND CORONARY ANGIOGRAPHY;  Surgeon: Larey Dresser, MD;  Location: [place] [facility];  Service: Cardiovascular;  Laterality: N/A;   TONSILLECTOMY     VASECTOMY     VENTRICULAR ABLATION SURGERY  09/2008     Current Facility-Administered Medications  Medication Dose Route Frequency Provider Last Rate Last Admin   0.9 %  sodium chloride infusion   Intravenous Continuous Anner Baity, Ocie Doyne, MD 50 mL/hr at 10/05/21 1138 New Bag at 10/05/21 1138   ceFAZolin (ANCEF) IVPB 2g/100 mL premix  2 g Intravenous On Call Kyland No, Ocie Doyne, MD       gentamicin (GARAMYCIN) 80 mg in sodium chloride 0.9 % 500 mL irrigation  80 mg Irrigation On Call Voris Tigert, Ocie Doyne, MD        Allergies:   Atorvastatin, Crestor [rosuvastatin calcium], Enalapril, Lisinopril, Losartan, and Telmisartan   Social History:  The patient  reports that he quit smoking about 14 years ago. His smoking use included cigarettes. He has a 47.00 pack-year smoking history. He has never used smokeless tobacco. He reports current alcohol use. He reports that he does not use drugs.   Family History:  The patient's family history includes Gastric cancer in his mother; Heart attack (age of onset: 41) in his brother; Peripheral vascular disease in his father; Throat cancer in his father.   ROS:  Please see the history of present illness.   Otherwise, review of systems is positive for  none.   All other systems are reviewed and negative.   PHYSICAL EXAM: VS:  BP 140/76   Pulse 62   Temp 98.7 F (37.1 C) (Oral)   Resp 17   Ht 5' 9.5" (1.765 m)   Wt 65.8 kg   SpO2 98%   BMI 21.11 kg/m  , BMI Body mass index is 21.11 kg/m. GEN: Well nourished, well developed, in no acute distress  HEENT: normal  Neck: no JVD, carotid bruits, or masses Cardiac: RRR; no murmurs, rubs, or gallops,no edema  Respiratory:  clear to auscultation bilaterally, normal work of breathing GI: soft, nontender, nondistended, + BS MS: no deformity or atrophy  Skin: warm and dry Neuro:  Strength and sensation are intact Psych: euthymic mood, full affect  Recent Labs: 04/19/2021: ALT 14; TSH 3.900 10/02/2021: BUN 41; Creatinine, Ser 2.40; Hemoglobin 11.0; Platelets 226; Potassium 5.0; Sodium 137    Lipid Panel     Component Value Date/Time   CHOL 123 04/19/2021 1000   TRIG 83 04/19/2021 1000   HDL 49  04/19/2021 1000   CHOLHDL 2.5 04/19/2021 1000   VLDL 17 04/19/2021 1000   LDLCALC 57 04/19/2021 1000     Wt Readings from Last 3 Encounters:  10/05/21 65.8 kg  07/30/21 68.4 kg  07/23/21 67.7 kg      Other studies Reviewed: Additional studies/ records that were reviewed today include: TTE 10/18/20  Review of the above records today demonstrates:   1. Left ventricular ejection fraction, by estimation, is 30 to 35%. The  left ventricle has moderately decreased function. The left ventricle  demonstrates regional wall motion abnormalities with basal to mid inferior  and basal to mid inferolateral  akinesis. The anterolateral wall is hypokinetic. The left ventricular  internal cavity size was mildly dilated. Left ventricular diastolic  parameters are consistent with Grade I diastolic dysfunction (impaired  relaxation).   2. Right ventricular systolic function is mildly reduced. The right  ventricular size is normal. There is mildly elevated pulmonary artery  systolic pressure. The  estimated right ventricular systolic pressure is  54.2 mmHg.   3. Left atrial size was moderately dilated.   4. The mitral valve is abnormal. Moderate mitral valve regurgitation,  suspect infarct-related MR with wall motion abnormalities. No evidence of  mitral stenosis.   5. The aortic valve is tricuspid. Aortic valve regurgitation is not  visualized. Mild aortic valve sclerosis is present, with no evidence of  aortic valve stenosis.   6. The inferior vena cava is normal in size with <50% respiratory  variability, suggesting right atrial pressure of 8 mmHg.    ASSESSMENT AND PLAN:  1.  Chronic systolic heart failure due to ischemic cardiomyopathy:   ICD Criteria  Current LVEF:33%. Within 12 months prior to implant: Yes   Heart failure history: Yes, Class II  Cardiomyopathy history: Yes, Ischemic Cardiomyopathy - Prior MI.  Atrial Fibrillation/Atrial Flutter: No.  Ventricular tachycardia history: No.  Cardiac arrest history: No.  History of syndromes with risk of sudden death: No.  Previous ICD: Yes, Reason for ICD:  Primary prevention.  Current ICD indication: Primary  PPM indication: Yes. Pacing type: Ventricular. Greater than 40% RV pacing requirement anticipated. Indication: Complete Heart Block and Mobitz Type II  Class I or II Bradycardia indication present: No  Beta Blocker therapy for 3 or more months: Yes, prescribed.   Ace Inhibitor/ARB therapy for 3 or more months: No, medical reason.   I have seen Markos Theil is a 76 y.o. malepre-procedural and has been referred by Aundra Dubin for consideration of CRT-D upgrade implant for primary prevention of sudden death.  The patient's chart has been reviewed and they meet criteria for ICD implant.  I have had a thorough discussion with the patient reviewing options.  The patient and their family (if available) have had opportunities to ask questions and have them answered. The patient and I have decided together through  the [employer] Tool to implant ICD at this time.  Risks, benefits, alternatives to ICD implantation were discussed in detail with the patient today. The patient  understands that the risks include but are not limited to bleeding, infection, pneumothorax, perforation, tamponade, vascular damage, renal failure, MI, stroke, death, inappropriate shocks, and lead dislodgement and  wishes to proceed.

## 2021-10-06 ENCOUNTER — Encounter: Payer: Self-pay | Admitting: Cardiology

## 2021-10-07 ENCOUNTER — Encounter (HOSPITAL_COMMUNITY): Payer: Self-pay

## 2021-10-07 MED FILL — Heparin Sod (Porcine)-NaCl IV Soln 1000 Unit/500ML-0.9%: INTRAVENOUS | Qty: 500 | Status: AC

## 2021-10-08 ENCOUNTER — Encounter (HOSPITAL_COMMUNITY): Payer: Self-pay | Admitting: Cardiology

## 2021-10-08 ENCOUNTER — Encounter: Payer: Self-pay | Admitting: Cardiology

## 2021-10-08 ENCOUNTER — Telehealth: Payer: Self-pay

## 2021-10-08 NOTE — Telephone Encounter (Signed)
-----   Message from Shirley Friar, PA-C sent at 10/05/2021  4:09 PM EDT ----- Same day BIV upgrade 6/2 WC

## 2021-10-08 NOTE — Telephone Encounter (Signed)
Follow-up after same day discharge: Implant date: 10/05/2021 MD: Allegra Lai, MD Device: Medtronic [id number] [place] MRI Quad CRT-D Location: Left Chest   Wound check visit: 10/17/2021 @ 8:40 am 90 day MD follow-up: 01/14/2022 @ 8:30 am  Remote Transmission received:Yes  Dressing/sling removed: Yes  Confirm OAC restart on: N/A

## 2021-10-09 ENCOUNTER — Encounter (HOSPITAL_COMMUNITY): Payer: Self-pay | Admitting: Cardiology

## 2021-10-17 ENCOUNTER — Ambulatory Visit (INDEPENDENT_AMBULATORY_CARE_PROVIDER_SITE_OTHER): Payer: Medicare Other

## 2021-10-17 DIAGNOSIS — I472 Ventricular tachycardia, unspecified: Secondary | ICD-10-CM | POA: Diagnosis not present

## 2021-10-17 LAB — CUP PACEART INCLINIC DEVICE CHECK
Battery Remaining Longevity: 62 mo
Battery Voltage: 3.05 V
Brady Statistic AP VP Percent: 65.52 %
Brady Statistic AP VS Percent: 0.06 %
Brady Statistic AS VP Percent: 34.09 %
Brady Statistic AS VS Percent: 0.33 %
Brady Statistic RA Percent Paced: 64.35 %
Brady Statistic RV Percent Paced: 98.45 %
Date Time Interrogation Session: 20230614084800
HighPow Impedance: 37 Ohm
HighPow Impedance: 49 Ohm
Implantable Lead Implant Date: 20100520
Implantable Lead Implant Date: 20100520
Implantable Lead Implant Date: 20230602
Implantable Lead Location: 753858
Implantable Lead Location: 753859
Implantable Lead Location: 753860
Implantable Lead Model: 4598
Implantable Lead Model: 5076
Implantable Lead Model: 6947
Implantable Pulse Generator Implant Date: 20230602
Lead Channel Impedance Value: 222.34 Ohm
Lead Channel Impedance Value: 234.08 Ohm
Lead Channel Impedance Value: 234.08 Ohm
Lead Channel Impedance Value: 247 Ohm
Lead Channel Impedance Value: 250.943
Lead Channel Impedance Value: 250.943
Lead Channel Impedance Value: 304 Ohm
Lead Channel Impedance Value: 418 Ohm
Lead Channel Impedance Value: 418 Ohm
Lead Channel Impedance Value: 475 Ohm
Lead Channel Impedance Value: 532 Ohm
Lead Channel Impedance Value: 532 Ohm
Lead Channel Impedance Value: 779 Ohm
Lead Channel Impedance Value: 817 Ohm
Lead Channel Impedance Value: 836 Ohm
Lead Channel Impedance Value: 836 Ohm
Lead Channel Impedance Value: 836 Ohm
Lead Channel Impedance Value: 893 Ohm
Lead Channel Pacing Threshold Amplitude: 0.625 V
Lead Channel Pacing Threshold Amplitude: 2 V
Lead Channel Pacing Threshold Pulse Width: 0.4 ms
Lead Channel Pacing Threshold Pulse Width: 0.4 ms
Lead Channel Sensing Intrinsic Amplitude: 0.5 mV
Lead Channel Sensing Intrinsic Amplitude: 1.75 mV
Lead Channel Sensing Intrinsic Amplitude: 5.25 mV
Lead Channel Setting Pacing Amplitude: 2.25 V
Lead Channel Setting Pacing Amplitude: 2.25 V
Lead Channel Setting Pacing Amplitude: 4 V
Lead Channel Setting Pacing Pulse Width: 0.4 ms
Lead Channel Setting Pacing Pulse Width: 0.8 ms
Lead Channel Setting Sensing Sensitivity: 0.3 mV

## 2021-10-17 NOTE — Progress Notes (Signed)

## 2021-10-17 NOTE — Patient Instructions (Signed)

## 2021-10-28 ENCOUNTER — Encounter (HOSPITAL_COMMUNITY): Payer: Self-pay | Admitting: Cardiology

## 2021-10-29 ENCOUNTER — Other Ambulatory Visit (HOSPITAL_COMMUNITY): Payer: Self-pay | Admitting: Cardiology

## 2021-10-29 ENCOUNTER — Ambulatory Visit (HOSPITAL_COMMUNITY)
Admission: RE | Admit: 2021-10-29 | Discharge: 2021-10-29 | Disposition: A | Discharge status: Home / Self Care | Payer: Medicare Other | Source: Ambulatory Visit | Attending: Cardiology | Admitting: Cardiology

## 2021-10-29 VITALS — BP 122/66 | HR 70 | Wt 150.0 lb

## 2021-10-29 DIAGNOSIS — Z7901 Long term (current) use of anticoagulants: Secondary | ICD-10-CM | POA: Diagnosis not present

## 2021-10-29 DIAGNOSIS — I2510 Atherosclerotic heart disease of native coronary artery without angina pectoris: Secondary | ICD-10-CM | POA: Insufficient documentation

## 2021-10-29 DIAGNOSIS — Z955 Presence of coronary angioplasty implant and graft: Secondary | ICD-10-CM | POA: Insufficient documentation

## 2021-10-29 DIAGNOSIS — I5022 Chronic systolic (congestive) heart failure: Secondary | ICD-10-CM

## 2021-10-29 DIAGNOSIS — I255 Ischemic cardiomyopathy: Secondary | ICD-10-CM | POA: Diagnosis not present

## 2021-10-29 DIAGNOSIS — Z7902 Long term (current) use of antithrombotics/antiplatelets: Secondary | ICD-10-CM | POA: Insufficient documentation

## 2021-10-29 DIAGNOSIS — N183 Chronic kidney disease, stage 3 unspecified: Secondary | ICD-10-CM | POA: Diagnosis not present

## 2021-10-29 DIAGNOSIS — Z79899 Other long term (current) drug therapy: Secondary | ICD-10-CM | POA: Diagnosis not present

## 2021-10-29 DIAGNOSIS — E785 Hyperlipidemia, unspecified: Secondary | ICD-10-CM | POA: Diagnosis not present

## 2021-10-29 DIAGNOSIS — Z8679 Personal history of other diseases of the circulatory system: Secondary | ICD-10-CM | POA: Diagnosis not present

## 2021-10-29 DIAGNOSIS — I130 Hypertensive heart and chronic kidney disease with heart failure and stage 1 through stage 4 chronic kidney disease, or unspecified chronic kidney disease: Secondary | ICD-10-CM | POA: Diagnosis not present

## 2021-10-29 DIAGNOSIS — Z8616 Personal history of COVID-19: Secondary | ICD-10-CM | POA: Insufficient documentation

## 2021-10-29 DIAGNOSIS — E039 Hypothyroidism, unspecified: Secondary | ICD-10-CM | POA: Diagnosis not present

## 2021-10-29 DIAGNOSIS — I472 Ventricular tachycardia, unspecified: Secondary | ICD-10-CM | POA: Diagnosis not present

## 2021-10-29 DIAGNOSIS — J84112 Idiopathic pulmonary fibrosis: Secondary | ICD-10-CM | POA: Insufficient documentation

## 2021-10-29 MED ORDER — CARVEDILOL 12.5 MG PO TABS: 12.5000 mg | ORAL_TABLET | Freq: Two times a day (BID) | ORAL | 3 refills | Status: DC

## 2021-10-31 ENCOUNTER — Other Ambulatory Visit (HOSPITAL_COMMUNITY): Payer: Self-pay | Admitting: Cardiology

## 2021-11-12 ENCOUNTER — Other Ambulatory Visit (HOSPITAL_COMMUNITY): Payer: Self-pay | Admitting: Cardiology

## 2021-11-21 ENCOUNTER — Telehealth (HOSPITAL_COMMUNITY): Payer: Self-pay | Admitting: Cardiology

## 2021-11-21 NOTE — Telephone Encounter (Signed)
Abnormal labs receive from PCP Labs drawn 11/16/21 TIBC 372 Sat% 18 Fe 67 Ferritin 78.7   Per Jessica,NP/Dr mcLean Pt will need iron infusion Pt aware and voiced understanding-advised this is something that his PCP will havre to manage and follow-PCP to arrange infusion

## 2021-11-29 ENCOUNTER — Other Ambulatory Visit (HOSPITAL_COMMUNITY): Payer: Self-pay | Admitting: Cardiology

## 2022-01-10 ENCOUNTER — Ambulatory Visit (INDEPENDENT_AMBULATORY_CARE_PROVIDER_SITE_OTHER): Payer: Medicare Other

## 2022-01-10 DIAGNOSIS — I472 Ventricular tachycardia, unspecified: Secondary | ICD-10-CM | POA: Diagnosis not present

## 2022-01-10 LAB — CUP PACEART REMOTE DEVICE CHECK
Battery Remaining Longevity: 59 mo
Battery Voltage: 2.99 V
Brady Statistic AP VP Percent: 59.64 %
Brady Statistic AP VS Percent: 0.18 %
Brady Statistic AS VP Percent: 39.75 %
Brady Statistic AS VS Percent: 0.43 %
Brady Statistic RA Percent Paced: 58.61 %
Brady Statistic RV Percent Paced: 97.9 %
Date Time Interrogation Session: 20230907043823
HighPow Impedance: 36 Ohm
HighPow Impedance: 47 Ohm
Implantable Lead Implant Date: 20100520
Implantable Lead Implant Date: 20100520
Implantable Lead Implant Date: 20230602
Implantable Lead Location: 753858
Implantable Lead Location: 753859
Implantable Lead Location: 753860
Implantable Lead Model: 4598
Implantable Lead Model: 5076
Implantable Lead Model: 6947
Implantable Pulse Generator Implant Date: 20230602
Lead Channel Impedance Value: 189.525
Lead Channel Impedance Value: 193.707
Lead Channel Impedance Value: 201.488
Lead Channel Impedance Value: 204.14 Ohm
Lead Channel Impedance Value: 218.087
Lead Channel Impedance Value: 285 Ohm
Lead Channel Impedance Value: 304 Ohm
Lead Channel Impedance Value: 361 Ohm
Lead Channel Impedance Value: 399 Ohm
Lead Channel Impedance Value: 418 Ohm
Lead Channel Impedance Value: 418 Ohm
Lead Channel Impedance Value: 456 Ohm
Lead Channel Impedance Value: 551 Ohm
Lead Channel Impedance Value: 646 Ohm
Lead Channel Impedance Value: 703 Ohm
Lead Channel Impedance Value: 703 Ohm
Lead Channel Impedance Value: 722 Ohm
Lead Channel Impedance Value: 779 Ohm
Lead Channel Pacing Threshold Amplitude: 0.625 V
Lead Channel Pacing Threshold Amplitude: 1.25 V
Lead Channel Pacing Threshold Pulse Width: 0.4 ms
Lead Channel Pacing Threshold Pulse Width: 0.4 ms
Lead Channel Sensing Intrinsic Amplitude: 0.25 mV
Lead Channel Sensing Intrinsic Amplitude: 0.25 mV
Lead Channel Sensing Intrinsic Amplitude: 5.25 mV
Lead Channel Setting Pacing Amplitude: 1.5 V
Lead Channel Setting Pacing Amplitude: 2.25 V
Lead Channel Setting Pacing Amplitude: 4 V
Lead Channel Setting Pacing Pulse Width: 0.4 ms
Lead Channel Setting Pacing Pulse Width: 0.8 ms
Lead Channel Setting Sensing Sensitivity: 0.3 mV

## 2022-01-14 ENCOUNTER — Ambulatory Visit: Payer: Medicare Other | Attending: Cardiology | Admitting: Cardiology

## 2022-01-14 ENCOUNTER — Encounter: Payer: Self-pay | Admitting: Cardiology

## 2022-01-14 VITALS — BP 108/70 | HR 61 | Ht 70.0 in | Wt 153.0 lb

## 2022-01-14 DIAGNOSIS — I5022 Chronic systolic (congestive) heart failure: Secondary | ICD-10-CM

## 2022-01-14 DIAGNOSIS — I472 Ventricular tachycardia, unspecified: Secondary | ICD-10-CM

## 2022-01-14 LAB — CUP PACEART INCLINIC DEVICE CHECK
Battery Remaining Longevity: 78 mo
Battery Voltage: 3 V
Brady Statistic AP VP Percent: 60.64 %
Brady Statistic AP VS Percent: 0.28 %
Brady Statistic AS VP Percent: 38.67 %
Brady Statistic AS VS Percent: 0.41 %
Brady Statistic RA Percent Paced: 59.7 %
Brady Statistic RV Percent Paced: 97.84 %
Date Time Interrogation Session: 20230911085819
HighPow Impedance: 39 Ohm
HighPow Impedance: 54 Ohm
Implantable Lead Implant Date: 20100520
Implantable Lead Implant Date: 20100520
Implantable Lead Implant Date: 20230602
Implantable Lead Location: 753858
Implantable Lead Location: 753859
Implantable Lead Location: 753860
Implantable Lead Model: 4598
Implantable Lead Model: 5076
Implantable Lead Model: 6947
Implantable Pulse Generator Implant Date: 20230602
Lead Channel Impedance Value: 189.525
Lead Channel Impedance Value: 193.707
Lead Channel Impedance Value: 193.707
Lead Channel Impedance Value: 204.14 Ohm
Lead Channel Impedance Value: 209 Ohm
Lead Channel Impedance Value: 247 Ohm
Lead Channel Impedance Value: 304 Ohm
Lead Channel Impedance Value: 361 Ohm
Lead Channel Impedance Value: 399 Ohm
Lead Channel Impedance Value: 418 Ohm
Lead Channel Impedance Value: 418 Ohm
Lead Channel Impedance Value: 456 Ohm
Lead Channel Impedance Value: 532 Ohm
Lead Channel Impedance Value: 646 Ohm
Lead Channel Impedance Value: 665 Ohm
Lead Channel Impedance Value: 703 Ohm
Lead Channel Impedance Value: 703 Ohm
Lead Channel Impedance Value: 722 Ohm
Lead Channel Pacing Threshold Amplitude: 0.625 V
Lead Channel Pacing Threshold Amplitude: 1.375 V
Lead Channel Pacing Threshold Pulse Width: 0.4 ms
Lead Channel Pacing Threshold Pulse Width: 0.4 ms
Lead Channel Sensing Intrinsic Amplitude: 0.25 mV
Lead Channel Sensing Intrinsic Amplitude: 1 mV
Lead Channel Sensing Intrinsic Amplitude: 5.25 mV
Lead Channel Setting Pacing Amplitude: 1.5 V
Lead Channel Setting Pacing Amplitude: 2 V
Lead Channel Setting Pacing Amplitude: 2.25 V
Lead Channel Setting Pacing Pulse Width: 0.4 ms
Lead Channel Setting Pacing Pulse Width: 0.8 ms
Lead Channel Setting Sensing Sensitivity: 0.3 mV

## 2022-01-14 NOTE — Patient Instructions (Signed)
Medication Instructions:  Your physician recommends that you continue on your current medications as directed. Please refer to the Current Medication list given to you today.  *If you need a refill on your cardiac medications before your next appointment, please call your pharmacy*   Lab Work: None ordered If you have labs (blood work) drawn today and your tests are completely normal, you will receive your results only by: [place] (if you have MyChart) OR A paper copy in the mail If you have any lab test that is abnormal or we need to change your treatment, we will call you to review the results.   Testing/Procedures: None ordered   Follow-Up: At [facility], you and your health needs are our priority.  As part of our continuing mission to provide you with exceptional heart care, we have created designated Provider Care Teams.  These Care Teams include your primary Cardiologist (physician) and Advanced Practice Providers (APPs -  Physician Assistants and Nurse Practitioners) who all work together to provide you with the care you need, when you need it.  We recommend signing up for the patient portal called "MyChart".  Sign up information is provided on this After Visit Summary.  MyChart is used to connect with patients for Virtual Visits (Telemedicine).  Patients are able to view lab/test results, encounter notes, upcoming appointments, etc.  Non-urgent messages can be sent to your provider as well.   To learn more about what you can do with MyChart, go to [email or web address].    Remote monitoring is used to monitor your Pacemaker or ICD from home. This monitoring reduces the number of office visits required to check your device to one time per year. It allows [place] to keep an eye on the functioning of your device to ensure it is working properly. You are scheduled for a device check from home on 04/11/2022. You may send your transmission at any time that day. If you have a  wireless device, the transmission will be sent automatically. After your physician reviews your transmission, you will receive a postcard with your next transmission date.  Your next appointment:   1 year(s)  The format for your next appointment:   In Person  Provider:   Allegra Lai, MD    Thank you for choosing [facility]!!   Trinidad Curet, RN [phone]    Other Instructions   Important Information About Sugar

## 2022-01-14 NOTE — Progress Notes (Signed)
Electrophysiology Office Note   Date:  01/14/2022   ID:  Nathan Pruitt, DOB 07-02-1945, MRN [id number]  PCP:  Ronita Hipps, MD  Cardiologist:  Aundra Dubin Primary Electrophysiologist:  Medha Pippen Meredith Leeds, MD    Chief Complaint: CHF   History of Present Illness: Nathan Pruitt is a 76 y.o. male who is being seen today for the evaluation of CHF at the request of Ronita Hipps, MD. Presenting today for electrophysiology evaluation.  He has a history significant for coronary artery disease, hypertension, hyperlipidemia, remote tobacco abuse, PAD, rheumatoid arthritis, restrictive lung disease, ischemic cardiomyopathy.  He has atrial flutter and is post isthmus ablation in 2010.  In 2018 he developed episodes of ventricular tachycardia.  Catheterization showed occlusion of the ramus branch and a CTO of the RCA with collaterals.  He had drug-eluting stent x3 to the ramus.  Amiodarone was increased.  He is status post Medtronic ICD with upgrade to a BiV ICD 10/05/2021.  Today, denies symptoms of palpitations, chest pain, shortness of breath, orthopnea, PND, lower extremity edema, claudication, dizziness, presyncope, syncope, bleeding, or neurologic sequela. The patient is tolerating medications without difficulties.  Since his device was upgraded he has done well.  He states that his respiratory status is about the same but he feels that this is due to his lung disease.   Past Medical History:  Diagnosis Date   Abnormal PFTs 11/2009   FVC 74%, FEV1 80%, ratio 75%, TLC 78%, DLCO 68%. this suggests a mild restrictive and obstructive defect. pt did have a response to bronchodilator. these PFTs were significantly better than the report from Dr. Alcide Clever in [place] done prior.    AICD (automatic cardioverter/defibrillator) present 2010   Anxiety    Atrial flutter (HCC)    s/p isthmus ablation 5/10   CAD (coronary artery disease)    hx of silent MI in 1993. likely an inferior MI. hx of 2D  cardiogram in 3/09 showing EF of 40%. hx of myoview in [place] 3/09-nml. presented to [facility] 5/10 with VT and mildly elevated cardiac enzymes LHC (5/10): inferobasal dyskinesis with EF 35-40%. was chronic total occlusion of mid RCA with good collaterals. luminals LCA. this does not appear to be an acute cause of the 5/10 event   Cancer ([place])    skin   Cervical radiculopathy    CHF (congestive heart failure) (HCC)    Chronic lower back pain    CKD (chronic kidney disease)    HLD (hyperlipidemia)    HTN (hypertension)    Ischemic cardiomyopathy    EF 35-40% by LV-gram 5/10 with inferobasal dyskinesis. echo 5/10 showed EF 40% w/mild LVH, no sig. MR, inferobasal and posterobasal akinesis. 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 Scot Dock at [facility] in 2009    Rheumatoid arthritis [facility])    on leflunomide   Silent myocardial infarction [facility]) 1993   "silent"   Tobacco abuse    47 pack year hx; quit 8/09   Ventricular tachycardia ([place])    likely scar-mediated. 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 He has duel chamber Medtronic ICD   Past Surgical History:  Procedure Laterality Date   BIV UPGRADE N/A 10/05/2021   Procedure: Owens Loffler;  Surgeon: Constance Haw, MD;  Location: [place] [facility];  Service: Cardiovascular;  Laterality: N/A;  CARDIAC DEFIBRILLATOR PLACEMENT  09/22/2008   CATARACT EXTRACTION W/ INTRAOCULAR LENS  IMPLANT, BILATERAL Bilateral 05/2016 - 06/2016   CORONARY BALLOON ANGIOPLASTY N/A 01/28/2020   Procedure: CORONARY BALLOON ANGIOPLASTY;  Surgeon: Jettie Booze, MD;  Location: [place] [facility];  Service: Cardiovascular;  Laterality: N/A;   CORONARY CTO INTERVENTION  07/16/2016   CORONARY CTO INTERVENTION N/A 07/16/2016   Procedure: Coronary CTO Intervention;  Surgeon: Belva Crome, MD;   Location: [place] [facility];  Service: Cardiovascular;  Laterality: N/A;   EP IMPLANTABLE DEVICE  09/22/08   Medtronic, ICD Model Number:  [id number], ICD Serial Number: [id number]   FEMORAL ARTERY - FEMORAL ARTERY BYPASS GRAFT  march 2009   left to right bypass, first @ [facility], second at [facility] by Dr Scot Dock   ICD GENERATOR CHANGEOUT N/A 11/12/2016   Procedure: ICD Generator Changeout;  Surgeon: Thompson Grayer, MD;  Location: [place] [facility];  Service: Cardiovascular;  Laterality: N/A;   RIGHT/LEFT HEART CATH AND CORONARY ANGIOGRAPHY N/A 07/12/2016   Procedure: Right/Left Heart Cath and Coronary Angiography;  Surgeon: Larey Dresser, MD;  Location: [place] [facility];  Service: Cardiovascular;  Laterality: N/A;   RIGHT/LEFT HEART CATH AND CORONARY ANGIOGRAPHY N/A 01/28/2020   Procedure: RIGHT/LEFT HEART CATH AND CORONARY ANGIOGRAPHY;  Surgeon: Larey Dresser, MD;  Location: [place] [facility];  Service: Cardiovascular;  Laterality: N/A;   TONSILLECTOMY     VASECTOMY     VENTRICULAR ABLATION SURGERY  09/2008     Current Outpatient Medications  Medication Sig Dispense Refill   acetaminophen (TYLENOL) 500 MG tablet Take 500-1,000 mg by mouth every 6 (six) hours as needed (for pain.).     amiodarone (PACERONE) 200 MG tablet TAKE 1/2 TABLET BY MOUTH DAILY 45 tablet 7   carvedilol (COREG) 12.5 MG tablet Take 1 tablet (12.5 mg total) by mouth 2 (two) times daily. 180 tablet 3   chlorhexidine (PERIDEX) 0.12 % solution Use as directed 15 mLs in the mouth or throat daily as needed (irritation).   3   clopidogrel (PLAVIX) 75 MG tablet Take 1 tablet by mouth once daily with breakfast 90 tablet 3   eplerenone (INSPRA) 25 MG tablet Take 1 tablet by mouth once daily 90 tablet 3   FEROSUL 325 (65 Fe) MG tablet Take 325 mg by mouth daily.     fluticasone (FLONASE) 50 MCG/ACT nasal spray Place 1-2 sprays into both nostrils daily as needed for allergies or rhinitis.     furosemide  (LASIX) 40 MG tablet Take 1 tablet by mouth once daily 90 tablet 3   leflunomide (ARAVA) 20 MG tablet Take 20 mg by mouth daily.     levothyroxine (SYNTHROID) 25 MCG tablet Take 1 tablet (25 mcg total) by mouth daily before breakfast. 90 tablet 3   Melatonin 10 MG TABS Take 10 mg by mouth at bedtime as needed (sleep).      simvastatin (ZOCOR) 20 MG tablet TAKE 1 TABLET BY MOUTH EVERYDAY AT BEDTIME 90 tablet 3   No current facility-administered medications for this visit.    Allergies:   Atorvastatin, Crestor [rosuvastatin calcium], Enalapril, Lisinopril, Losartan, and Telmisartan   Social History:  The patient  reports that he quit smoking about 14 years ago. His smoking use included cigarettes. He has a 47.00 pack-year smoking history. He has never used smokeless tobacco. He reports current alcohol use. He reports that he does not use drugs.   Family History:  The patient's  family history includes Gastric cancer in his mother; Heart attack (age of onset: 30) in his brother; Peripheral vascular disease in his father; Throat cancer in his father.   ROS:  Please see the history of present illness.   Otherwise, review of systems is positive for none.   All other systems are reviewed and negative.   PHYSICAL EXAM: VS:  BP 108/70   Pulse 61   Ht '5\' 10"'$  (1.778 m)   Wt 153 lb (69.4 kg)   SpO2 94%   BMI 21.95 kg/m  , BMI Body mass index is 21.95 kg/m. GEN: Well nourished, well developed, in no acute distress  HEENT: normal  Neck: no JVD, carotid bruits, or masses Cardiac: RRR; no murmurs, rubs, or gallops,no edema  Respiratory:  clear to auscultation bilaterally, normal work of breathing GI: soft, nontender, nondistended, + BS MS: no deformity or atrophy  Skin: warm and dry, device site well healed Neuro:  Strength and sensation are intact Psych: euthymic mood, full affect  EKG:  EKG is ordered today. Personal review of the ekg ordered shows AV paced  Personal review of the device  interrogation today. Results in [place]: 04/19/2021: ALT 14; TSH 3.900 10/02/2021: BUN 41; Creatinine, Ser 2.40; Hemoglobin 11.0; Platelets 226; Potassium 5.0; Sodium 137    Lipid Panel     Component Value Date/Time   CHOL 123 04/19/2021 1000   TRIG 83 04/19/2021 1000   HDL 49 04/19/2021 1000   CHOLHDL 2.5 04/19/2021 1000   VLDL 17 04/19/2021 1000   LDLCALC 57 04/19/2021 1000     Wt Readings from Last 3 Encounters:  01/14/22 153 lb (69.4 kg)  10/29/21 150 lb (68 kg)  10/05/21 145 lb (65.8 kg)      Other studies Reviewed: Additional studies/ records that were reviewed today include: TTE 10/18/20  Review of the above records today demonstrates:   1. Left ventricular ejection fraction, by estimation, is 30 to 35%. The  left ventricle has moderately decreased function. The left ventricle  demonstrates regional wall motion abnormalities with basal to mid inferior  and basal to mid inferolateral  akinesis. The anterolateral wall is hypokinetic. The left ventricular  internal cavity size was mildly dilated. Left ventricular diastolic  parameters are consistent with Grade I diastolic dysfunction (impaired  relaxation).   2. Right ventricular systolic function is mildly reduced. The right  ventricular size is normal. There is mildly elevated pulmonary artery  systolic pressure. The estimated right ventricular systolic pressure is  78.2 mmHg.   3. Left atrial size was moderately dilated.   4. The mitral valve is abnormal. Moderate mitral valve regurgitation,  suspect infarct-related MR with wall motion abnormalities. No evidence of  mitral stenosis.   5. The aortic valve is tricuspid. Aortic valve regurgitation is not  visualized. Mild aortic valve sclerosis is present, with no evidence of  aortic valve stenosis.   6. The inferior vena cava is normal in size with <50% respiratory  variability, suggesting right atrial pressure of 8 mmHg.    ASSESSMENT AND  PLAN:  1.  Chronic systolic heart failure due to ischemic cardiomyopathy: Status post Medtronic ICD with device upgrade/2/23.  Currently on carvedilol 12.5 mg twice daily, eplerenone 25 mg daily.  Not able to tolerate ARB's due to elevated creatinine.  Is followed by heart failure clinic.  Device functioning appropriately.  No changes at this time.  2.  Coronary artery disease: Status post DES to the ramus, OM1.  Continue Plavix per primary cardiology.  3.  Hyperlipidemia: Continue simvastatin per primary cardiology  4.  Ventricular tachycardia: Currently on amiodarone 100 mg daily.  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  We Dangelo Guzzetta continue with pulmonary follow-up.  High risk medication monitoring for amiodarone today.  Labs reviewed for amiodarone toxicity.   Current medicines are reviewed at length with the patient today.   The patient does not have concerns regarding his medicines.  The following changes were made today: none  Labs/ tests ordered today include:  Orders Placed This Encounter  Procedures   EKG 12-Lead     Disposition:   FU with Kea Callan 12 months  Signed, Zhuri Krass Meredith Leeds, MD  01/14/2022 9:02 AM     [address] [place] [address] [phone] (office) [phone] (fax)

## 2022-01-16 ENCOUNTER — Encounter: Payer: Self-pay | Admitting: Cardiology

## 2022-01-26 NOTE — Progress Notes (Signed)
Remote ICD transmission.   

## 2022-01-28 ENCOUNTER — Encounter (HOSPITAL_COMMUNITY): Payer: Self-pay | Admitting: Cardiology

## 2022-01-29 ENCOUNTER — Ambulatory Visit (HOSPITAL_COMMUNITY)
Admission: RE | Admit: 2022-01-29 | Discharge: 2022-01-29 | Disposition: A | Discharge status: Home / Self Care | Payer: Medicare Other | Source: Ambulatory Visit | Attending: Cardiology | Admitting: Cardiology

## 2022-01-29 VITALS — BP 100/70 | HR 60 | Wt 153.8 lb

## 2022-01-29 DIAGNOSIS — Z7902 Long term (current) use of antithrombotics/antiplatelets: Secondary | ICD-10-CM | POA: Diagnosis not present

## 2022-01-29 DIAGNOSIS — Z79899 Other long term (current) drug therapy: Secondary | ICD-10-CM | POA: Diagnosis not present

## 2022-01-29 DIAGNOSIS — E039 Hypothyroidism, unspecified: Secondary | ICD-10-CM | POA: Insufficient documentation

## 2022-01-29 DIAGNOSIS — Z955 Presence of coronary angioplasty implant and graft: Secondary | ICD-10-CM | POA: Diagnosis not present

## 2022-01-29 DIAGNOSIS — I5022 Chronic systolic (congestive) heart failure: Secondary | ICD-10-CM | POA: Insufficient documentation

## 2022-01-29 DIAGNOSIS — J849 Interstitial pulmonary disease, unspecified: Secondary | ICD-10-CM | POA: Diagnosis not present

## 2022-01-29 DIAGNOSIS — E785 Hyperlipidemia, unspecified: Secondary | ICD-10-CM | POA: Insufficient documentation

## 2022-01-29 DIAGNOSIS — I130 Hypertensive heart and chronic kidney disease with heart failure and stage 1 through stage 4 chronic kidney disease, or unspecified chronic kidney disease: Secondary | ICD-10-CM | POA: Diagnosis present

## 2022-01-29 DIAGNOSIS — I472 Ventricular tachycardia, unspecified: Secondary | ICD-10-CM | POA: Insufficient documentation

## 2022-01-29 DIAGNOSIS — Z8249 Family history of ischemic heart disease and other diseases of the circulatory system: Secondary | ICD-10-CM | POA: Diagnosis not present

## 2022-01-29 DIAGNOSIS — I739 Peripheral vascular disease, unspecified: Secondary | ICD-10-CM | POA: Insufficient documentation

## 2022-01-29 DIAGNOSIS — Z8679 Personal history of other diseases of the circulatory system: Secondary | ICD-10-CM | POA: Insufficient documentation

## 2022-01-29 DIAGNOSIS — Z79890 Hormone replacement therapy (postmenopausal): Secondary | ICD-10-CM | POA: Diagnosis not present

## 2022-01-29 DIAGNOSIS — R946 Abnormal results of thyroid function studies: Secondary | ICD-10-CM | POA: Diagnosis not present

## 2022-01-29 DIAGNOSIS — I2510 Atherosclerotic heart disease of native coronary artery without angina pectoris: Secondary | ICD-10-CM | POA: Insufficient documentation

## 2022-01-29 DIAGNOSIS — N183 Chronic kidney disease, stage 3 unspecified: Secondary | ICD-10-CM | POA: Insufficient documentation

## 2022-01-29 DIAGNOSIS — I255 Ischemic cardiomyopathy: Secondary | ICD-10-CM | POA: Diagnosis not present

## 2022-01-29 DIAGNOSIS — J8410 Pulmonary fibrosis, unspecified: Secondary | ICD-10-CM | POA: Insufficient documentation

## 2022-01-29 NOTE — Patient Instructions (Signed)
Change Lasix 40 mg in the morning and 20 mg in the evening for 3 days, then go back to 40 mg daily.  Your physician has requested that you have an echocardiogram. Echocardiography is a painless test that uses sound waves to create images of your heart. It provides your doctor with information about the size and shape of your heart and how well your heart's chambers and valves are working. This procedure takes approximately one hour. There are no restrictions for this procedure.   Your physician recommends that you schedule a follow-up appointment in: 4 months ( January 2024)  ** please call the office in November to arrange your follow up appointment **   If you have any questions or concerns before your next appointment please send [place] a message through [place] or call our office at [phone].    TO LEAVE A MESSAGE FOR THE NURSE SELECT OPTION 2, PLEASE LEAVE A MESSAGE INCLUDING: YOUR NAME DATE OF BIRTH CALL BACK NUMBER REASON FOR CALL**this is important as we prioritize the call backs  YOU WILL RECEIVE A CALL BACK THE SAME DAY AS LONG AS YOU CALL BEFORE 4:00 PM  At the [facility], you and your health needs are our priority. As part of our continuing mission to provide you with exceptional heart care, we have created designated Provider Care Teams. These Care Teams include your primary Cardiologist (physician) and Advanced Practice Providers (APPs- Physician Assistants and Nurse Practitioners) who all work together to provide you with the care you need, when you need it.   You may see any of the following providers on your designated Care Team at your next follow up: Dr Glori Bickers Dr Loralie Champagne Dr. Roxana Hires, NP Lyda Jester, [place] [facility] [place], [place] Forestine Na, NP Audry Riles, PharmD   Please be sure to bring in all your medications bottles to every appointment.

## 2022-01-29 NOTE — Progress Notes (Signed)
ID:  Amery Minasyan, DOB [birth year], MRN [id number]   Provider location: [place] [facility] Type of Visit: Established patient   PCP:  Ronita Hipps, MD  Cardiologist:  Loralie Champagne, MD  History of Present Illness: 76 y.o. with a history of CAD, PAD, ischemic CMP, and VT s/p ICD presents for followup of CHF, CAD.   Echo in 2/17 showed EF 40% and regional WMAs.   In 3/18, he developed episodes of VT as well as increased dyspnea.  LHC/RHC was done, showing new occlusion of a large ramus (CTO, probably a month or so old at time of cath) and old CTO RCA with collaterals.  He had DES x 3 to ramus.  Filling pressures were optimized on RHC.  Amiodarone was increased to 200 mg daily from 100 mg daily.  No VT since PCI.    Echo in 6/19 showed EF 35-40% with wall motion abnormalities. Echo in 1/21 showed EF 40-45% with basal to mid inferolateral and inferior akinesis, mild MR, normal RV, PASP 35 mmHg.   LHC/RHC in 9/21 with normal filling pressures and cardiac output but 95% OM1 stenosis.  This was treated with DES to OM1.   He has been following with Dr. Vaughan Browner with idiopathic pulmonary fibrosis.  I was concerned that he could have amiodarone -related lung toxicity but Dr. Vaughan Browner did not think this was very likely.  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     Labs drawn in 1/22 showed K up to 5.9 and creatinine 4.  He was sent to the ER at [facility].  He was found to have COVID-19 and was admitted. He had a productive cough and sore throat.  He was thought to be dehydrated and got IV fluid.  Creatinine 2.3 at discharge.  Lasix, valsartan, and eplerenone were stopped.  Other meds were continued.  Echo in 6/22 showed EF 30-35%, basal-mid inferior and inferolateral akinesis, anterolateral hypokinesis, moderate MR (appears infarct-related), mildly decreased RV function.   In 6/23, he had upgrade of device to CRT-D given frequent RV pacing.   He is  stable symptomatically.  He is not very active by choice, does not exercise.  Shireen Quan is poor.  Able to walk around in stores without dyspnea but gets short of breath walking up an incline.  No chest pain.  No lightheadedness.  No palpitations.  Weight up 5 lbs.   ECG (personally reviewed): A-BiV sequential pacing  Medtronic device interrogation: 1 run NSVT, no AF, decreased impedance with fluid index rising. 99% BiV pacing.    Labs (11/10): K 4.9, creatinine 1.4, LDL 70, HDL 31, LFTs normal Labs (3/11): K 4.4, creatinine 1.3, LDL 61, HDL 35 Labs (7/11): LDL 62, HDL 23 Labs (1/12): K 4.8, LFTs normal, creatinine 1.44, LDL 69, HDL 33 Labs (7/12): LFTs normal, LDL 81, HDL 36, HCT normal, K 4.3, creatinine 1.3, BNP 116, TSH normal Labs (1/13): LDL 39, HDl 31, LFTs normal, TSH  Labs (7/13): TSH normal, LFTs normal, LDL 69, HDL 40 Labs (8/13): K 4.5, creatinine 1.6 Labs (10/13): K 4.4, creatinine 1.4 Labs (3/14): LDL 108, HDL 30, LFTs normal Labs (9/14): K 4.2, creatinine 1.6, LFTs normal, LDL 114, TSH 10.6 (elevated), free T4/free T3 normal Labs (2/15): K 4.6, creatinine 1.5, LFTs normal, TSH 7.6 (elevated), free T4 and free T3 normal Labs (3/15): K 3.6, creatinine 1.5, LFTs normal, TSH normal Labs (9/15): K 4.3, creatinine 1.6, LFTs normal, TSH normal, LDL 115,  HDL 24 Labs (10/16): K 4.5, creatinine 1.5, LDL 90, HDL 35, hgb 12.8 Labs (11/16): Low free T3, normal TSH and free T4 Labs (12/16): K 5, creatinine 1.8, LFTs normal, TSH 8.6 (mildly elevated) Labs (1/17): Free T4/TSH/free T3 normal, LFTs normal Labs (2/17): K 4.4, creatinine 2.4, LDL 131, HDL 34 Labs (4/17): K 4.2, creatinine 1.3, LDL 67, HDL 35 Labs (9/17): K 4.9, creatinine 2.2, LDL 54, HDL 35, free T3 and T4 normal, hgb 12 Labs (4/18): K 5, creatinine 1.78, LFTs normal, TSH elevated but free T3 and free T4 normal.  Labs (6/18): LDL 52, HDL 35, TSH elevated 6.8, LFTs normal Labs (7/18): K 4.3, creatinine 1.68 Labs (10/18): TSH  5.6, free T3 normal, free T4 normal, LFTs normal, K 4.9, creatinine 1.4 Labs (11/18): K 4.5, creatinine 1.46, LFTs normal Labs (4/19): K 4.8, creatinine 1.6, LFTs normal Labs (6/19): TSH slightly elevated, free T3/T4 both normal, LFTs normal Labs (7/19): K 4.3, creatinine 1.3 Labs (10/19): LFTs normal, LDL 58, TGs 165, TSH 6.6 but free T4 and free T3 normal Labs (1/20): K 4.7, creatinine 1.7 Labs (2/20): LFTs normal Labs (4/20): K 4.6, creatinine 1.4, LDL 77, HDL 36, hgb 12.2, TSH mild elevation 7.4, free T4 and free T3 normal.  Labs (7/20): K 3.8, creatinine 1.6, hgb 12.2, LDL 81, TGs 158, HDL 35, TSH 7.7 (mildly elevated), LFTs normal Labs (11/20): K 4.5, creatinine 1.7, NT-proBNP 1358, TSH 5.6 (mild elevation), LDL 88, LFTs normal Labs (4/21): K 4.3, creatinine 1.6, hgb 12.4, LFTs, normal, TGs 105, LDL 81, HDL 40, TSH 5.6 (mildly elevated), free T3 and free T4 normal Labs (6/21): K 4.7, creatinine 1.9, AST/ALT normal, TSH mildly elevated at 7.9, free T3 and T4 normal, LDL 87, TGs 118 Labs (10/21): K 4.4, creatinine 1.8, LFTs normal, LDL 89, pro-BNP 1830, LFTs normal Labs (11/21): ESR 48, K 4.6, creatinine 1.96 Labs (12/21): ANA negative Labs (1/22): K 5.9, creatinine 4 => 2.3 Labs (2/22): K 4.6, creatinine 2.1 => 2.2, LDL 77, LFTs normal Labs (4/22): K 4.7,creatinine 2.7, TSH mildly elevated but free T3 and free T4 normal, hgb 10.2, LDL 67, LFTs normal Labs (5/22): creatinine 2.0 Labs (6/22): K 4.7, creatinine 2.4 Labs (9/22): K 4.4, creatinine 2.6, LFTs normal, hgb 9.9, LDL 62, TSH 5.94, free T4 normal, free T3 low.  Labs (10/22): TSH 4.99 Labs (12/22): LDL 57 Labs (3/23): TSH normal, free T3 and free T4 normal, K 4.7, creatinine 2.3, LFTs normal, LDL 61 Labs (6/23): hgb 10.1, K 5.1, creatinine 2.1 Labs (7/23): LDL 72, LFTs normal Labs (9/23): hgb 11.2, K 4.7, creatinine 2.2, LFTs normal   Allergies (verified):  No Known Drug Allergies   Past Medical History: 1. Coronary artery  disease. The patient reports history of silent MI in 1993.  This was likely an inferior MI (see below).  - The patient presented to [facility] in 5/10 with VT and mildly elevated cardiac enzymes. LHC (5/10):  Inferobasal dyskinesis with EF 35-40%.  There was chronic total occlusion of the mid RCA with good collaterals.  Luminals LCA.  This did not appear to be an acute cause of the 5/10 event.  - LHC (3/18): Known occlusion of RCA with collaterals, new occlusion of ramus.  DES x 3 to ramus.  - LHC (9/21): Known occlusion RCA with collaterals, 95% OMI treated with DES.  2. Hypertension.  3. Hyperlipidemia: Intolerant of higher doses of simvastatin, Crestor, Lipitor, pravastatin. Zetia caused constipation.  4. Remote tobacco abuse with 47-pack-year history,  quitting in August 2009.  5. Peripheral arterial disease.  - Status post left-to-right fem-fem bypass performed in [place] in March 2009.  - Status post redo left-to-right fem-fem bypass performed by Dr. Scot Dock at [facility] in 2009.  - ABIs normal 3/15, ABIs normal 3/16, ABIs normal 3/17, ABIs normal 4/18, ABIs normal 4/19, ABIs normal 12/20.  6. Rheumatoid arthritis, on leflunomide.  7.  Ischemic cardiomyopathy:  EF 35-40% by LV-gram 5/10 with inferobasal dyskinesis.  Echo 5/10 showed EF 40% with mild LVH, no significant MR, inferobasal and posterobasal akinesis.  Echo (7/11): EF 50%, mild LVH, basal-mid inferoposterior akinesis.  Echo (7/12): EF 45-50% with basal anterolateral, basal posterior, and basal to mid inferior akinesis.  Echo (4/16): EF 40-45%, basal to mid inferolateral AK, basal inferior AK, basal to mid anterolateral HK.  Echo (2/17): EF 40% with basal to mid inferior akinesis, basal inferolateral aneurysm, mid inferolateral akinesis, basal anterolateral hypokinesis, normal RV size and systolic function, PASP 25 mmHg.  - Echo (3/18) with EF 30-35%, moderate LV dilation, moderate MR, mildly decreased RV systolic function, PASP 51  mmmHg. - RHC (3/18): mean RA 4, PA 38/12, mean PCWP 17, CI 2.2.  - Echo (6/19): EF 35-40%, inferior/inferolateral/anterolateral WMAs, moderate MR likely infarct-related, normal RV size with mildly decreased systolic function.  - Echo (1/21): EF 35-40% with wall motion abnormalities. Echo was done today and reviewed, EF 40-45% with basal to mid inferolateral and inferior akinesis, mild MR, normal RV, PASP 35 mmHg.  - RHC (9/21): mean RA 4, PA 33/12, mean PCWP 9, CI 3.12 - Echo (6/22): EF 30-35%, basal-mid inferior and inferolateral akinesis, anterolateral hypokinesis, moderate MR (appears infarct-related), mildly decreased RV function.  - CRT-D upgrade (Medtronic) in 6/23.  8.  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  He has a Medtronic CRT-D device.  9.  Atrial flutter:  Status post isthmus ablation 5/10.   10. Restrictive lung defect: PFTs (7/11): FVC 74%, FEV1 80%, ratio 75%, TLC 78%, DLCO 68%.  This suggests a mild restrictive defect and a mild obstructive defect.  He did have response to bronchodilator. These PFTs were significantly better than the report from Dr. Alcide Clever in [place] done prior.  He had last PFTs in 2/12 with no significant change.  - PFTs (9/21) with significantly decreased DLCO and concern for ILD/pulmonary fibrosis.  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  11.  Anxiety 12.  Chronic cough: No relief with change from ACEI to ARB or with trial of PPI.  13.  CKD: Stage 3.  14. Mitral regurgitation: Moderate on 6/22 echo, suspect infarct-related.  15. AAA: 3.1 cm AAA on abdominal US 12/20  - Abdominal US 5/22 with 3.1 cm AAA 16. COVID-19 infection: 1/22.   Current Outpatient Medications  Medication Sig Dispense Refill   acetaminophen (TYLENOL) 500 MG tablet Take 500-1,000 mg by mouth every 6 (six) hours as needed (for pain.).     amiodarone (PACERONE) 200 MG tablet TAKE 1/2 TABLET BY MOUTH  DAILY 45 tablet 7   carvedilol (COREG) 12.5 MG tablet Take 1 tablet (12.5 mg total) by mouth 2 (two) times daily. 180 tablet 3   chlorhexidine (PERIDEX) 0.12 % solution Use as directed 15 mLs in the mouth or throat daily as needed (irritation).   3   clopidogrel (PLAVIX) 75 MG tablet Take 1 tablet by mouth once daily with breakfast 90 tablet 3   eplerenone (INSPRA) 25 MG tablet  Take 1 tablet by mouth once daily 90 tablet 3   FEROSUL 325 (65 Fe) MG tablet Take 325 mg by mouth daily.     fluticasone (FLONASE) 50 MCG/ACT nasal spray Place 1-2 sprays into both nostrils daily as needed for allergies or rhinitis.     furosemide (LASIX) 40 MG tablet Take 1 tablet by mouth once daily 90 tablet 3   leflunomide (ARAVA) 20 MG tablet Take 20 mg by mouth daily.     levothyroxine (SYNTHROID) 25 MCG tablet Take 1 tablet (25 mcg total) by mouth daily before breakfast. 90 tablet 3   Melatonin 10 MG TABS Take 10 mg by mouth at bedtime as needed (sleep).      simvastatin (ZOCOR) 20 MG tablet TAKE 1 TABLET BY MOUTH EVERYDAY AT BEDTIME 90 tablet 3   No current facility-administered medications for this encounter.    Allergies:   Atorvastatin, Crestor [rosuvastatin calcium], Enalapril, Lisinopril, Losartan, and Telmisartan   Social History:  The patient  reports that he quit smoking about 14 years ago. His smoking use included cigarettes. He has a 47.00 pack-year smoking history. He has never used smokeless tobacco. He reports current alcohol use. He reports that he does not use drugs.   Family History:  The patient's family history includes Gastric cancer in his mother; Heart attack (age of onset: 15) in his brother; Peripheral vascular disease in his father; Throat cancer in his father.   ROS:  Please see the history of present illness.   All other systems are personally reviewed and negative.   Exam:   BP 100/70   Pulse 60   Wt 69.8 kg (153 lb 12.8 oz)   SpO2 96%   BMI 22.07 kg/m  General: NAD Neck: No  JVD, no thyromegaly or thyroid nodule.  Lungs: Clear to auscultation bilaterally with normal respiratory effort. CV: Nondisplaced PMI.  Heart regular S1/S2, no S3/S4, no murmur.  No peripheral edema.  No carotid bruit.  Normal pedal pulses.  Abdomen: Soft, nontender, no hepatosplenomegaly, no distention.  Skin: Intact without lesions or rashes.  Neurologic: Alert and oriented x 3.  Psych: Normal affect. Extremities: No clubbing or cyanosis.  HEENT: Normal.   Recent Labs: 04/19/2021: ALT 14; TSH 3.900 10/02/2021: BUN 41; Creatinine, Ser 2.40; Hemoglobin 11.0; Platelets 226; Potassium 5.0; Sodium 137  Personally reviewed   Wt Readings from Last 3 Encounters:  01/29/22 69.8 kg (153 lb 12.8 oz)  01/14/22 69.4 kg (153 lb)  10/29/21 68 kg (150 lb)     ASSESSMENT AND PLAN:  1. Chronic systolic CHF: Ischemic cardiomyopathy.  Medtronic CRT-D device.  Echo in 6/22 with EF 30-35%, mild RV dysfunction. NYHA class III symptoms, stable.  He is not volume overloaded by exam or Optivol and weight is down.  Complicated by cardiorenal syndrome.  Upgraded to CRT device given high percentage of RV pacing, device appears to be functioning appropriately.  Chronic NYHA class III symptoms.  He does not look volume overloaded on exam but Optivol suggests volume overload and weight is up. - Continue Coreg 12.5 mg bid.       - Increase Lasix to 40 qam/20 qpm x 3 days then back to 40 mg daily.   - Continue eplerenone 25 mg daily. Will not increase with high normal K.  - He is off valsartan for now with elevated creatinine, may be able to start back on low dose in the future.    - He did not tolerate Jardiance.  Wilder Glade is  too expensive for him, we have tried several times to find a way to get it for him to no avail.  - Repeat echo at followup.  2. CAD: Denies chest pain.  Had DES x 3 to ramus in 3/18 and DES to [place] in 9/21.   - Continue Zocor, lipids ok in 7/23.  - Continue Plavix long-term, off ASA.    3.  Hyperlipidemia: Stable on simvastatin without myalgias.  Has not been able to tolerate higher dose of simvastatin or other statins. Unable to tolerate Zetia due to constipation.  4. Hx of VT: Has been on amiodarone 100 mg daily. Has ICD.  He did not tolerate sotalol.  - He is on Levoxyl for amiodarone-induced mild hypothyroidism.  - Normal LFTs in 9/23.    - Knows to get a yearly eye exam - See below regarding ?amiodarone lung toxicity.  5. PAD: He denies claudication.  Normal ABIs in 12/20, followed by [facility]. 6. AAA: 3.1 cm in 5/22, followed by [facility].  7. CKD: Stage 3.   - Follows with nephrology now.   8. Pulmonary: PFTs were done with increased dyspnea and amiodorone use, concerning for ILD/pulmonary fibrosis.  High resolution CT chest was concerning for ILD.  I have worried about amiodarone-induced lung toxicity. 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  He was offered antifibrotic treatment but wants to hold off for now. I think that some of his symptomatology is due to IPF.  - Can continue amiodarone 100 mg daily as above.    - Pulmonary followup with Dr. Vaughan Browner.    Followup in 4 months with echo.   Signed, Loralie Champagne, MD  01/29/2022   [facility] [address] [facility] [place] [address] [phone] (office) [phone] (fax)

## 2022-02-04 ENCOUNTER — Other Ambulatory Visit (HOSPITAL_COMMUNITY): Payer: Self-pay | Admitting: Cardiology

## 2022-04-11 ENCOUNTER — Ambulatory Visit (INDEPENDENT_AMBULATORY_CARE_PROVIDER_SITE_OTHER): Payer: Medicare Other

## 2022-04-11 DIAGNOSIS — I255 Ischemic cardiomyopathy: Secondary | ICD-10-CM

## 2022-04-11 LAB — CUP PACEART REMOTE DEVICE CHECK
Battery Remaining Longevity: 75 mo
Battery Voltage: 2.98 V
Brady Statistic AP VP Percent: 56.81 %
Brady Statistic AP VS Percent: 0.19 %
Brady Statistic AS VP Percent: 42.87 %
Brady Statistic AS VS Percent: 0.12 %
Brady Statistic RA Percent Paced: 56.41 %
Brady Statistic RV Percent Paced: 98.95 %
Date Time Interrogation Session: 20231207022730
HighPow Impedance: 35 Ohm
HighPow Impedance: 46 Ohm
Implantable Lead Connection Status: 753985
Implantable Lead Connection Status: 753985
Implantable Lead Connection Status: 753985
Implantable Lead Implant Date: 20100520
Implantable Lead Implant Date: 20100520
Implantable Lead Implant Date: 20230602
Implantable Lead Location: 753858
Implantable Lead Location: 753859
Implantable Lead Location: 753860
Implantable Lead Model: 4598
Implantable Lead Model: 5076
Implantable Lead Model: 6947
Implantable Pulse Generator Implant Date: 20230602
Lead Channel Impedance Value: 175.622
Lead Channel Impedance Value: 184.154
Lead Channel Impedance Value: 188.1 Ohm
Lead Channel Impedance Value: 193.707
Lead Channel Impedance Value: 204.14 Ohm
Lead Channel Impedance Value: 285 Ohm
Lead Channel Impedance Value: 304 Ohm
Lead Channel Impedance Value: 342 Ohm
Lead Channel Impedance Value: 361 Ohm
Lead Channel Impedance Value: 399 Ohm
Lead Channel Impedance Value: 418 Ohm
Lead Channel Impedance Value: 418 Ohm
Lead Channel Impedance Value: 475 Ohm
Lead Channel Impedance Value: 589 Ohm
Lead Channel Impedance Value: 608 Ohm
Lead Channel Impedance Value: 665 Ohm
Lead Channel Impedance Value: 665 Ohm
Lead Channel Impedance Value: 703 Ohm
Lead Channel Pacing Threshold Amplitude: 0.75 V
Lead Channel Pacing Threshold Amplitude: 1.25 V
Lead Channel Pacing Threshold Pulse Width: 0.4 ms
Lead Channel Pacing Threshold Pulse Width: 0.4 ms
Lead Channel Sensing Intrinsic Amplitude: 0.625 mV
Lead Channel Sensing Intrinsic Amplitude: 0.625 mV
Lead Channel Sensing Intrinsic Amplitude: 5.25 mV
Lead Channel Setting Pacing Amplitude: 1.5 V
Lead Channel Setting Pacing Amplitude: 2 V
Lead Channel Setting Pacing Amplitude: 2.25 V
Lead Channel Setting Pacing Pulse Width: 0.4 ms
Lead Channel Setting Pacing Pulse Width: 0.8 ms
Lead Channel Setting Sensing Sensitivity: 0.3 mV

## 2022-04-15 ENCOUNTER — Encounter (HOSPITAL_COMMUNITY): Payer: Self-pay | Admitting: Cardiology

## 2022-05-03 NOTE — Progress Notes (Signed)
Remote ICD transmission.   

## 2022-06-03 ENCOUNTER — Other Ambulatory Visit: Payer: Self-pay | Admitting: *Deleted

## 2022-06-03 MED ORDER — SIMVASTATIN 20 MG PO TABS: ORAL_TABLET | ORAL | 3 refills | Status: DC

## 2022-06-11 ENCOUNTER — Ambulatory Visit (HOSPITAL_COMMUNITY)
Admission: RE | Admit: 2022-06-11 | Discharge: 2022-06-11 | Disposition: A | Discharge status: Home / Self Care | Payer: Medicare Other | Source: Ambulatory Visit | Attending: Cardiology | Admitting: Cardiology

## 2022-06-11 ENCOUNTER — Other Ambulatory Visit (HOSPITAL_COMMUNITY): Payer: Self-pay

## 2022-06-11 ENCOUNTER — Ambulatory Visit (HOSPITAL_BASED_OUTPATIENT_CLINIC_OR_DEPARTMENT_OTHER)
Admission: RE | Admit: 2022-06-11 | Discharge: 2022-06-11 | Disposition: A | Discharge status: Home / Self Care | Payer: Medicare Other | Source: Ambulatory Visit | Attending: Cardiology | Admitting: Cardiology

## 2022-06-11 ENCOUNTER — Encounter (HOSPITAL_COMMUNITY): Payer: Self-pay | Admitting: Cardiology

## 2022-06-11 VITALS — BP 104/60 | HR 61 | Wt 153.2 lb

## 2022-06-11 DIAGNOSIS — I2510 Atherosclerotic heart disease of native coronary artery without angina pectoris: Secondary | ICD-10-CM | POA: Diagnosis not present

## 2022-06-11 DIAGNOSIS — R0602 Shortness of breath: Secondary | ICD-10-CM | POA: Diagnosis not present

## 2022-06-11 DIAGNOSIS — E785 Hyperlipidemia, unspecified: Secondary | ICD-10-CM | POA: Diagnosis not present

## 2022-06-11 DIAGNOSIS — Z8616 Personal history of COVID-19: Secondary | ICD-10-CM | POA: Insufficient documentation

## 2022-06-11 DIAGNOSIS — Z8249 Family history of ischemic heart disease and other diseases of the circulatory system: Secondary | ICD-10-CM | POA: Insufficient documentation

## 2022-06-11 DIAGNOSIS — N183 Chronic kidney disease, stage 3 unspecified: Secondary | ICD-10-CM | POA: Diagnosis not present

## 2022-06-11 DIAGNOSIS — J84112 Idiopathic pulmonary fibrosis: Secondary | ICD-10-CM | POA: Insufficient documentation

## 2022-06-11 DIAGNOSIS — I5022 Chronic systolic (congestive) heart failure: Secondary | ICD-10-CM | POA: Diagnosis present

## 2022-06-11 DIAGNOSIS — I255 Ischemic cardiomyopathy: Secondary | ICD-10-CM | POA: Diagnosis not present

## 2022-06-11 DIAGNOSIS — Z79899 Other long term (current) drug therapy: Secondary | ICD-10-CM | POA: Diagnosis not present

## 2022-06-11 DIAGNOSIS — I714 Abdominal aortic aneurysm, without rupture, unspecified: Secondary | ICD-10-CM | POA: Insufficient documentation

## 2022-06-11 DIAGNOSIS — I130 Hypertensive heart and chronic kidney disease with heart failure and stage 1 through stage 4 chronic kidney disease, or unspecified chronic kidney disease: Secondary | ICD-10-CM | POA: Insufficient documentation

## 2022-06-11 DIAGNOSIS — Z79890 Hormone replacement therapy (postmenopausal): Secondary | ICD-10-CM | POA: Diagnosis not present

## 2022-06-11 DIAGNOSIS — Z7902 Long term (current) use of antithrombotics/antiplatelets: Secondary | ICD-10-CM | POA: Insufficient documentation

## 2022-06-11 DIAGNOSIS — I080 Rheumatic disorders of both mitral and aortic valves: Secondary | ICD-10-CM | POA: Insufficient documentation

## 2022-06-11 DIAGNOSIS — E039 Hypothyroidism, unspecified: Secondary | ICD-10-CM | POA: Diagnosis not present

## 2022-06-11 LAB — ECHOCARDIOGRAM COMPLETE
MV M vel: 5.07 m/s
MV Peak grad: 102.6 mmHg
P 1/2 time: 558 msec
Radius: 0.4 cm
S' Lateral: 5.6 cm
Single Plane A4C EF: 26.6 %

## 2022-06-11 NOTE — Patient Instructions (Signed)
There has been no changes to your medications. ONCE YOUR INSURANCE APPROVES IT YOU WILL BE STARTED ON [place]  Your physician recommends that you schedule a follow-up appointment in: 3 months   If you have any questions or concerns before your next appointment please send [place] a message through [place] or call our office at [phone].    TO LEAVE A MESSAGE FOR THE NURSE SELECT OPTION 2, PLEASE LEAVE A MESSAGE INCLUDING: YOUR NAME DATE OF BIRTH CALL BACK NUMBER REASON FOR CALL**this is important as we prioritize the call backs  YOU WILL RECEIVE A CALL BACK THE SAME DAY AS LONG AS YOU CALL BEFORE 4:00 PM  At the [facility], you and your health needs are our priority. As part of our continuing mission to provide you with exceptional heart care, we have created designated Provider Care Teams. These Care Teams include your primary Cardiologist (physician) and Advanced Practice Providers (APPs- Physician Assistants and Nurse Practitioners) who all work together to provide you with the care you need, when you need it.   You may see any of the following providers on your designated Care Team at your next follow up: Dr Glori Bickers Dr Loralie Champagne Dr. Roxana Hires, NP Lyda Jester, [place] [facility] [place], [place] Forestine Na, NP Audry Riles, PharmD   Please be sure to bring in all your medications bottles to every appointment.    Thank you for choosing [facility]

## 2022-06-11 NOTE — Progress Notes (Signed)
ID:  Nathan Pruitt, DOB 08/07/1945, MRN [id number]   Provider location: [place] [facility] Type of Visit: Established patient   PCP:  Ronita Hipps, MD  Cardiologist:  Loralie Champagne, MD  History of Present Illness: 77 y.o. with a history of CAD, PAD, ischemic CMP, and VT s/p ICD presents for followup of CHF, CAD.   Echo in 2/17 showed EF 40% and regional WMAs.   In 3/18, he developed episodes of VT as well as increased dyspnea.  LHC/RHC was done, showing new occlusion of a large ramus (CTO, probably a month or so old at time of cath) and old CTO RCA with collaterals.  He had DES x 3 to ramus.  Filling pressures were optimized on RHC.  Amiodarone was increased to 200 mg daily from 100 mg daily.  No VT since PCI.    Echo in 6/19 showed EF 35-40% with wall motion abnormalities. Echo in 1/21 showed EF 40-45% with basal to mid inferolateral and inferior akinesis, mild MR, normal RV, PASP 35 mmHg.   LHC/RHC in 9/21 with normal filling pressures and cardiac output but 95% OM1 stenosis.  This was treated with DES to OM1.   He has been following with Dr. Vaughan Browner with idiopathic pulmonary fibrosis.  I was concerned that he could have amiodarone -related lung toxicity but Dr. Vaughan Browner did not think this was very likely.  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     Labs drawn in 1/22 showed K up to 5.9 and creatinine 4.  He was sent to the ER at [facility].  He was found to have COVID-19 and was admitted. He had a productive cough and sore throat.  He was thought to be dehydrated and got IV fluid.  Creatinine 2.3 at discharge.  Lasix, valsartan, and eplerenone were stopped.  Other meds were continued.  Echo in 6/22 showed EF 30-35%, basal-mid inferior and inferolateral akinesis, anterolateral hypokinesis, moderate MR (appears infarct-related), mildly decreased RV function.   In 6/23, he had upgrade of device to CRT-D given frequent RV pacing.  Echo was  done today and reviewed, EF 25-30% with anterolateral/inferolateral akinesis, basal-mid inferior akinesis, mild RV dysfunction, mild-moderate MR.   He is stable symptomatically.  He is not very active by choice, does not exercise.  Shireen Quan is poor.  Able to walk around in stores without dyspnea but gets short of breath walking up an incline or stairs.  No orthopnea/PND.  No chest pain.  No lightheadedness.   ECG (personally reviewed): A-BiV pacing  Medtronic device interrogation: No AF or sustained VT. 99% BiV pacing. Stable thoracic impedance.    Labs (11/10): K 4.9, creatinine 1.4, LDL 70, HDL 31, LFTs normal Labs (3/11): K 4.4, creatinine 1.3, LDL 61, HDL 35 Labs (7/11): LDL 62, HDL 23 Labs (1/12): K 4.8, LFTs normal, creatinine 1.44, LDL 69, HDL 33 Labs (7/12): LFTs normal, LDL 81, HDL 36, HCT normal, K 4.3, creatinine 1.3, BNP 116, TSH normal Labs (1/13): LDL 39, HDl 31, LFTs normal, TSH  Labs (7/13): TSH normal, LFTs normal, LDL 69, HDL 40 Labs (8/13): K 4.5, creatinine 1.6 Labs (10/13): K 4.4, creatinine 1.4 Labs (3/14): LDL 108, HDL 30, LFTs normal Labs (9/14): K 4.2, creatinine 1.6, LFTs normal, LDL 114, TSH 10.6 (elevated), free T4/free T3 normal Labs (2/15): K 4.6, creatinine 1.5, LFTs normal, TSH 7.6 (elevated), free T4 and free T3 normal Labs (3/15): K 3.6, creatinine 1.5, LFTs normal, TSH  normal Labs (9/15): K 4.3, creatinine 1.6, LFTs normal, TSH normal, LDL 115, HDL 24 Labs (10/16): K 4.5, creatinine 1.5, LDL 90, HDL 35, hgb 12.8 Labs (11/16): Low free T3, normal TSH and free T4 Labs (12/16): K 5, creatinine 1.8, LFTs normal, TSH 8.6 (mildly elevated) Labs (1/17): Free T4/TSH/free T3 normal, LFTs normal Labs (2/17): K 4.4, creatinine 2.4, LDL 131, HDL 34 Labs (4/17): K 4.2, creatinine 1.3, LDL 67, HDL 35 Labs (9/17): K 4.9, creatinine 2.2, LDL 54, HDL 35, free T3 and T4 normal, hgb 12 Labs (4/18): K 5, creatinine 1.78, LFTs normal, TSH elevated but free T3 and free T4  normal.  Labs (6/18): LDL 52, HDL 35, TSH elevated 6.8, LFTs normal Labs (7/18): K 4.3, creatinine 1.68 Labs (10/18): TSH 5.6, free T3 normal, free T4 normal, LFTs normal, K 4.9, creatinine 1.4 Labs (11/18): K 4.5, creatinine 1.46, LFTs normal Labs (4/19): K 4.8, creatinine 1.6, LFTs normal Labs (6/19): TSH slightly elevated, free T3/T4 both normal, LFTs normal Labs (7/19): K 4.3, creatinine 1.3 Labs (10/19): LFTs normal, LDL 58, TGs 165, TSH 6.6 but free T4 and free T3 normal Labs (1/20): K 4.7, creatinine 1.7 Labs (2/20): LFTs normal Labs (4/20): K 4.6, creatinine 1.4, LDL 77, HDL 36, hgb 12.2, TSH mild elevation 7.4, free T4 and free T3 normal.  Labs (7/20): K 3.8, creatinine 1.6, hgb 12.2, LDL 81, TGs 158, HDL 35, TSH 7.7 (mildly elevated), LFTs normal Labs (11/20): K 4.5, creatinine 1.7, NT-proBNP 1358, TSH 5.6 (mild elevation), LDL 88, LFTs normal Labs (4/21): K 4.3, creatinine 1.6, hgb 12.4, LFTs, normal, TGs 105, LDL 81, HDL 40, TSH 5.6 (mildly elevated), free T3 and free T4 normal Labs (6/21): K 4.7, creatinine 1.9, AST/ALT normal, TSH mildly elevated at 7.9, free T3 and T4 normal, LDL 87, TGs 118 Labs (10/21): K 4.4, creatinine 1.8, LFTs normal, LDL 89, pro-BNP 1830, LFTs normal Labs (11/21): ESR 48, K 4.6, creatinine 1.96 Labs (12/21): ANA negative Labs (1/22): K 5.9, creatinine 4 => 2.3 Labs (2/22): K 4.6, creatinine 2.1 => 2.2, LDL 77, LFTs normal Labs (4/22): K 4.7,creatinine 2.7, TSH mildly elevated but free T3 and free T4 normal, hgb 10.2, LDL 67, LFTs normal Labs (5/22): creatinine 2.0 Labs (6/22): K 4.7, creatinine 2.4 Labs (9/22): K 4.4, creatinine 2.6, LFTs normal, hgb 9.9, LDL 62, TSH 5.94, free T4 normal, free T3 low.  Labs (10/22): TSH 4.99 Labs (12/22): LDL 57 Labs (3/23): TSH normal, free T3 and free T4 normal, K 4.7, creatinine 2.3, LFTs normal, LDL 61 Labs (6/23): hgb 10.1, K 5.1, creatinine 2.1 Labs (7/23): LDL 72, LFTs normal Labs (9/23): hgb 11.2, K 4.7,  creatinine 2.2, LFTs normal, transferrin saturation 21% Labs (1/24): K 4.5, creatinine 2.3, LFTs normal, TSH slightly elevated   Allergies (verified):  No Known Drug Allergies   Past Medical History: 1. Coronary artery disease. The patient reports history of silent MI in 1993.  This was likely an inferior MI (see below).  - The patient presented to [facility] in 5/10 with VT and mildly elevated cardiac enzymes. LHC (5/10):  Inferobasal dyskinesis with EF 35-40%.  There was chronic total occlusion of the mid RCA with good collaterals.  Luminals LCA.  This did not appear to be an acute cause of the 5/10 event.  - LHC (3/18): Known occlusion of RCA with collaterals, new occlusion of ramus.  DES x 3 to ramus.  - LHC (9/21): Known occlusion RCA with collaterals, 95% OMI treated with  DES.  2. Hypertension.  3. Hyperlipidemia: Intolerant of higher doses of simvastatin, Crestor, Lipitor, pravastatin. Zetia caused constipation.  4. Remote tobacco abuse with 47-pack-year history, quitting in August 2009.  5. Peripheral arterial disease.  - Status post left-to-right fem-fem bypass performed in [place] in March 2009.  - Status post redo left-to-right fem-fem bypass performed by Dr. Scot Dock at [facility] in 2009.  - ABIs normal 3/15, ABIs normal 3/16, ABIs normal 3/17, ABIs normal 4/18, ABIs normal 4/19, ABIs normal 12/20.  6. Rheumatoid arthritis, on leflunomide.  7.  Ischemic cardiomyopathy:  EF 35-40% by LV-gram 5/10 with inferobasal dyskinesis.  Echo 5/10 showed EF 40% with mild LVH, no significant MR, inferobasal and posterobasal akinesis.  Echo (7/11): EF 50%, mild LVH, basal-mid inferoposterior akinesis.  Echo (7/12): EF 45-50% with basal anterolateral, basal posterior, and basal to mid inferior akinesis.  Echo (4/16): EF 40-45%, basal to mid inferolateral AK, basal inferior AK, basal to mid anterolateral HK.  Echo (2/17): EF 40% with basal to mid inferior akinesis, basal inferolateral aneurysm, mid  inferolateral akinesis, basal anterolateral hypokinesis, normal RV size and systolic function, PASP 25 mmHg.  - Echo (3/18) with EF 30-35%, moderate LV dilation, moderate MR, mildly decreased RV systolic function, PASP 51 mmmHg. - RHC (3/18): mean RA 4, PA 38/12, mean PCWP 17, CI 2.2.  - Echo (6/19): EF 35-40%, inferior/inferolateral/anterolateral WMAs, moderate MR likely infarct-related, normal RV size with mildly decreased systolic function.  - Echo (1/21): EF 35-40% with wall motion abnormalities. Echo was done today and reviewed, EF 40-45% with basal to mid inferolateral and inferior akinesis, mild MR, normal RV, PASP 35 mmHg.  - RHC (9/21): mean RA 4, PA 33/12, mean PCWP 9, CI 3.12 - Echo (6/22): EF 30-35%, basal-mid inferior and inferolateral akinesis, anterolateral hypokinesis, moderate MR (appears infarct-related), mildly decreased RV function.  - CRT-D upgrade (Medtronic) in 6/23.  - Echo (2/24): EF 25-30% with anterolateral/inferolateral akinesis, basal-mid inferior akinesis, mild RV dysfunction, mild-moderate MR. 8.  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  He has a Medtronic CRT-D device.  9.  Atrial flutter:  Status post isthmus ablation 5/10.   10. Restrictive lung defect: PFTs (7/11): FVC 74%, FEV1 80%, ratio 75%, TLC 78%, DLCO 68%.  This suggests a mild restrictive defect and a mild obstructive defect.  He did have response to bronchodilator. These PFTs were significantly better than the report from Dr. Alcide Clever in [place] done prior.  He had last PFTs in 2/12 with no significant change.  - PFTs (9/21) with significantly decreased DLCO and concern for ILD/pulmonary fibrosis.  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  11.  Anxiety 12.  Chronic cough: No relief with change from ACEI to ARB or with trial of PPI.  13.  CKD: Stage 3.  14. Mitral regurgitation: Moderate on 6/22 echo, suspect  infarct-related.  15. AAA: 3.1 cm AAA on abdominal US 12/20  - Abdominal US 5/22 with 3.1 cm AAA 16. COVID-19 infection: 1/22.   Current Outpatient Medications  Medication Sig Dispense Refill   acetaminophen (TYLENOL) 500 MG tablet Take 500-1,000 mg by mouth every 6 (six) hours as needed (for pain.).     amiodarone (PACERONE) 200 MG tablet TAKE 1/2 TABLET BY MOUTH DAILY 45 tablet 7   carvedilol (COREG) 12.5 MG tablet Take 1 tablet (12.5 mg total) by mouth 2 (two) times daily. 180 tablet 3   chlorhexidine (PERIDEX) 0.12 % solution  Use as directed 15 mLs in the mouth or throat daily as needed (irritation).   3   clopidogrel (PLAVIX) 75 MG tablet Take 1 tablet by mouth once daily with breakfast 90 tablet 3   eplerenone (INSPRA) 25 MG tablet Take 1 tablet by mouth once daily 90 tablet 3   FEROSUL 325 (65 Fe) MG tablet Take 325 mg by mouth daily.     fluticasone (FLONASE) 50 MCG/ACT nasal spray Place 1-2 sprays into both nostrils daily as needed for allergies or rhinitis.     furosemide (LASIX) 40 MG tablet Take 1 tablet by mouth once daily 90 tablet 3   leflunomide (ARAVA) 20 MG tablet Take 20 mg by mouth daily.     levothyroxine (SYNTHROID) 25 MCG tablet Take 1 tablet (25 mcg total) by mouth daily before breakfast. 90 tablet 3   Melatonin 10 MG TABS Take 10 mg by mouth at bedtime as needed (sleep).      simvastatin (ZOCOR) 20 MG tablet TAKE 1 TABLET BY MOUTH EVERYDAY AT BEDTIME 90 tablet 3   No current facility-administered medications for this encounter.    Allergies:   Atorvastatin, Crestor [rosuvastatin calcium], Enalapril, Lisinopril, Losartan, and Telmisartan   Social History:  The patient  reports that he quit smoking about 15 years ago. His smoking use included cigarettes. He has a 47.00 pack-year smoking history. He has never used smokeless tobacco. He reports current alcohol use. He reports that he does not use drugs.   Family History:  The patient's family history includes Gastric  cancer in his mother; Heart attack (age of onset: 25) in his brother; Peripheral vascular disease in his father; Throat cancer in his father.   ROS:  Please see the history of present illness.   All other systems are personally reviewed and negative.   Exam:   BP 104/60   Pulse 61   Wt 69.5 kg (153 lb 3.2 oz)   SpO2 97%   BMI 21.98 kg/m  General: NAD Neck: No JVD, no thyromegaly or thyroid nodule.  Lungs: Clear to auscultation bilaterally with normal respiratory effort. CV: Nondisplaced PMI.  Heart regular S1/S2, no S3/S4, no murmur.  No peripheral edema.  No carotid bruit.  Difficult to palpate pedal pulses.  Abdomen: Soft, nontender, no hepatosplenomegaly, no distention.  Skin: Intact without lesions or rashes.  Neurologic: Alert and oriented x 3.  Psych: Normal affect. Extremities: No clubbing or cyanosis.  HEENT: Normal.   Recent Labs: 10/02/2021: BUN 41; Creatinine, Ser 2.40; Hemoglobin 11.0; Platelets 226; Potassium 5.0; Sodium 137  Personally reviewed   Wt Readings from Last 3 Encounters:  06/11/22 69.5 kg (153 lb 3.2 oz)  01/29/22 69.8 kg (153 lb 12.8 oz)  01/14/22 69.4 kg (153 lb)     ASSESSMENT AND PLAN:  1. Chronic systolic CHF: Ischemic cardiomyopathy.  Medtronic CRT-D device.  Echo in 6/22 with EF 30-35%, mild RV dysfunction. Upgraded to CRT device given high percentage of RV pacing, device appears to be functioning appropriately.  Echo today showed EF 25-30% with anterolateral/inferolateral akinesis, basal-mid inferior akinesis, mild RV dysfunction, mild-moderate MR.  Chronic NYHA class III symptoms.  He is not volume overloaded by exam or Optivol.  CKD stage 3 limits med titration.  - Continue Coreg 12.5 mg bid.       - Continue Lasix 40 mg daily, recent creatinine 2.3.   - Continue eplerenone 25 mg daily. Will not increase with high normal K.  - He is off valsartan for now  with elevated creatinine, may be able to start back on low dose in the future.    - He did  not tolerate Jardiance.  I am going to see if we can get Farxiga 10 mg daily for him today.  BMET 10 days.  2. CAD: Denies chest pain.  Had DES x 3 to ramus in 3/18 and DES to [place] in 9/21.   - Continue Zocor, will arrange for lipid check in [place].  - Continue Plavix long-term, off ASA.    3. Hyperlipidemia: Stable on simvastatin without myalgias.  Has not been able to tolerate higher dose of simvastatin or other statins. Unable to tolerate Zetia due to constipation.  - I will arrange for lipid check.  4. Hx of VT: Has been on amiodarone 100 mg daily. Has ICD.  He did not tolerate sotalol. Recent LFTs were normal.   - He is on Levoxyl for amiodarone-induced mild hypothyroidism.   - Knows to get a yearly eye exam - See below regarding ?amiodarone lung toxicity.  5. PAD: He denies claudication.  Normal ABIs in 12/20, followed by [facility]. 6. AAA: 3.1 cm in 5/22, followed by [facility].  7. CKD: Stage 3.  Last creatinine 2.3.  - Follows with nephrology now.   8. Pulmonary: PFTs were done with increased dyspnea and amiodorone use, concerning for ILD/pulmonary fibrosis.  High resolution CT chest was concerning for ILD.  I have worried about amiodarone-induced lung toxicity. 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  He was offered antifibrotic treatment but wants to hold off for now. I think that some of his symptomatology is due to IPF.  - Can continue amiodarone 100 mg daily as above.    - Pulmonary followup with Dr. Vaughan Browner.    Followup in 3 months, wants video visit.   Signed, Loralie Champagne, MD  06/11/2022   [place] [address] [facility] [place] [address] [phone] (office) [phone] (fax)

## 2022-06-11 NOTE — Progress Notes (Signed)
  Echocardiogram 2D Echocardiogram has been performed.  Nathan Pruitt 06/11/2022, 9:08 AM

## 2022-06-12 ENCOUNTER — Encounter (HOSPITAL_COMMUNITY): Payer: Self-pay | Admitting: Cardiology

## 2022-06-12 NOTE — Telephone Encounter (Signed)
Routed to [facility] that placed order.

## 2022-06-18 ENCOUNTER — Other Ambulatory Visit: Payer: Self-pay | Admitting: *Deleted

## 2022-06-18 DIAGNOSIS — I714 Abdominal aortic aneurysm, without rupture, unspecified: Secondary | ICD-10-CM

## 2022-06-18 DIAGNOSIS — I779 Disorder of arteries and arterioles, unspecified: Secondary | ICD-10-CM

## 2022-07-04 ENCOUNTER — Ambulatory Visit (INDEPENDENT_AMBULATORY_CARE_PROVIDER_SITE_OTHER)
Admission: RE | Admit: 2022-07-04 | Discharge: 2022-07-04 | Disposition: A | Discharge status: Home / Self Care | Payer: Medicare Other | Source: Ambulatory Visit | Attending: Vascular Surgery | Admitting: Vascular Surgery

## 2022-07-04 ENCOUNTER — Ambulatory Visit (HOSPITAL_COMMUNITY)
Admission: RE | Admit: 2022-07-04 | Discharge: 2022-07-04 | Disposition: A | Discharge status: Home / Self Care | Payer: Medicare Other | Source: Ambulatory Visit | Attending: Vascular Surgery | Admitting: Vascular Surgery

## 2022-07-04 ENCOUNTER — Ambulatory Visit (INDEPENDENT_AMBULATORY_CARE_PROVIDER_SITE_OTHER): Payer: Medicare Other | Admitting: Physician Assistant

## 2022-07-04 VITALS — BP 108/59 | HR 59 | Temp 98.0°F | Resp 20 | Ht 70.0 in | Wt 154.0 lb

## 2022-07-04 DIAGNOSIS — I7143 Infrarenal abdominal aortic aneurysm, without rupture: Secondary | ICD-10-CM | POA: Diagnosis not present

## 2022-07-04 DIAGNOSIS — I779 Disorder of arteries and arterioles, unspecified: Secondary | ICD-10-CM | POA: Diagnosis present

## 2022-07-04 DIAGNOSIS — I714 Abdominal aortic aneurysm, without rupture, unspecified: Secondary | ICD-10-CM | POA: Diagnosis present

## 2022-07-04 LAB — VAS US ABI WITH/WO TBI
Left ABI: 0.95
Right ABI: 1.09

## 2022-07-04 NOTE — Progress Notes (Signed)
Office Note     CC:  follow up Requesting Provider:  Ronita Hipps, MD  HPI: Nathan Pruitt is a 77 y.o. (23-Feb-1946) male who presents for surveillance of PAD.  He has a surgical history significant for redo left to right femoral to femoral bypass in 2009 by Dr. Scot Dock.  He denies any claudication, rest pain, or tissue loss of bilateral lower extremities.  In the past he has also been followed for a small AAA measuring 3 cm in May 2022 by duplex.  He denies any new or changing abdominal or back pain.  He is a former smoker.  He is on a Plavix and statin daily.  Past Medical History:  Diagnosis Date   Abnormal PFTs 11/2009   FVC 74%, FEV1 80%, ratio 75%, TLC 78%, DLCO 68%. this suggests a mild restrictive and obstructive defect. pt did have a response to bronchodilator. these PFTs were significantly better than the report from Dr. Alcide Clever in [place] done prior.    AICD (automatic cardioverter/defibrillator) present 2010   Anxiety    Atrial flutter (HCC)    s/p isthmus ablation 5/10   CAD (coronary artery disease)    hx of silent MI in 1993. likely an inferior MI. hx of 2D cardiogram in 3/09 showing EF of 40%. hx of myoview in [place] 3/09-nml. presented to [facility] 5/10 with VT and mildly elevated cardiac enzymes LHC (5/10): inferobasal dyskinesis with EF 35-40%. was chronic total occlusion of mid RCA with good collaterals. luminals LCA. this does not appear to be an acute cause of the 5/10 event   Cancer ([place])    skin   Cervical radiculopathy    CHF (congestive heart failure) (HCC)    Chronic lower back pain    CKD (chronic kidney disease)    HLD (hyperlipidemia)    HTN (hypertension)    Ischemic cardiomyopathy    EF 35-40% by LV-gram 5/10 with inferobasal dyskinesis. echo 5/10 showed EF 40% w/mild LVH, no sig. MR, inferobasal and posterobasal akinesis. echo (7/11): EF 50%, mild LVH, basal-mid inferoposterior akenesis   Migraine    "visual migraine; maybe 12/year" (07/16/2016)   PAD  (peripheral artery disease) (HCC)    s/p L-to-R fem-fem bypass performed by Dr. Scot Dock at [facility] in 2009    Rheumatoid arthritis [facility])    on leflunomide   Silent myocardial infarction [facility]) 1993   "silent"   Tobacco abuse    47 pack year hx; quit 8/09   Ventricular tachycardia ([place])    likely scar-mediated. 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 He has duel chamber Medtronic ICD    Past Surgical History:  Procedure Laterality Date   BIV UPGRADE N/A 10/05/2021   Procedure: Owens Loffler;  Surgeon: Constance Haw, MD;  Location: [place] [facility];  Service: Cardiovascular;  Laterality: N/A;   CARDIAC DEFIBRILLATOR PLACEMENT  09/22/2008   CATARACT EXTRACTION W/ INTRAOCULAR LENS  IMPLANT, BILATERAL Bilateral 05/2016 - 06/2016   CORONARY BALLOON ANGIOPLASTY N/A 01/28/2020   Procedure: CORONARY BALLOON ANGIOPLASTY;  Surgeon: Jettie Booze, MD;  Location: [place] [facility];  Service: Cardiovascular;  Laterality: N/A;   CORONARY CTO INTERVENTION  07/16/2016   CORONARY CTO INTERVENTION N/A 07/16/2016   Procedure: Coronary CTO Intervention;  Surgeon: Belva Crome, MD;  Location: [place] [facility];  Service: Cardiovascular;  Laterality: N/A;   EP IMPLANTABLE DEVICE  09/22/08   Medtronic, ICD Model Number:  [id number], ICD Serial Number: [id number]   FEMORAL ARTERY -  FEMORAL ARTERY BYPASS GRAFT  march 2009   left to right bypass, first @ [facility], second at [facility] by Dr Scot Dock   ICD GENERATOR CHANGEOUT N/A 11/12/2016   Procedure: ICD Generator Changeout;  Surgeon: Thompson Grayer, MD;  Location: [place] [facility];  Service: Cardiovascular;  Laterality: N/A;   RIGHT/LEFT HEART CATH AND CORONARY ANGIOGRAPHY N/A 07/12/2016   Procedure: Right/Left Heart Cath and Coronary Angiography;  Surgeon: Larey Dresser, MD;  Location: [place] [facility];  Service: Cardiovascular;  Laterality: N/A;   RIGHT/LEFT HEART CATH AND CORONARY ANGIOGRAPHY N/A 01/28/2020    Procedure: RIGHT/LEFT HEART CATH AND CORONARY ANGIOGRAPHY;  Surgeon: Larey Dresser, MD;  Location: [place] [facility];  Service: Cardiovascular;  Laterality: N/A;   TONSILLECTOMY     VASECTOMY     VENTRICULAR ABLATION SURGERY  09/2008    Social History   Socioeconomic History   Marital status: Single    Spouse name: Not on file   Number of children: 0   Years of education: 12   Highest education level: Some college, no degree  Occupational History   Not on file  Tobacco Use   Smoking status: Former    Packs/day: 1.00    Years: 47.00    Total pack years: 47.00    Types: Cigarettes    Quit date: 2009    Years since quitting: 15.1    Passive exposure: Never   Smokeless tobacco: Never  Vaping Use   Vaping Use: Never used  Substance and Sexual Activity   Alcohol use: Yes    Comment: 07/15/2016 "nothing for the last couple years" 04/18/21 very seldom   Drug use: No    Comment: prior    Sexual activity: Not on file  Other Topics Concern   Not on file  Social History Narrative   Single; gets minimal exercise; 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[employer].; originally from [place]   Caffeine 3/4 c daily   Social Determinants of Health   Financial Resource Strain: Low Risk  (07/19/2021)   Overall Financial Resource Strain (CARDIA)    Difficulty of Paying Living Expenses: Not very hard  Food Insecurity: No Food Insecurity (07/19/2021)   Hunger Vital Sign    Worried About Running Out of Food in the Last Year: Never true    [place] in the Last Year: Never true  Transportation Needs: No Transportation Needs (07/19/2021)   PRAPARE - Hydrologist (Medical): No    Lack of Transportation (Non-Medical): No  Physical Activity: Not on file  Stress: Not on file  Social Connections: Not on file  Intimate Partner Violence: Not on file    Family History  Problem Relation Age of Onset   Gastric cancer Mother    Throat cancer Father    Peripheral vascular disease Father     Heart attack Brother 88    Current Outpatient Medications  Medication Sig Dispense Refill   acetaminophen (TYLENOL) 500 MG tablet Take 500-1,000 mg by mouth every 6 (six) hours as needed (for pain.).     amiodarone (PACERONE) 200 MG tablet TAKE 1/2 TABLET BY MOUTH DAILY 45 tablet 7   carvedilol (COREG) 12.5 MG tablet Take 1 tablet (12.5 mg total) by mouth 2 (two) times daily. 180 tablet 3   chlorhexidine (PERIDEX) 0.12 % solution Use as directed 15 mLs in the mouth or throat daily as needed (irritation).   3   clopidogrel (PLAVIX) 75 MG tablet Take  1 tablet by mouth once daily with breakfast 90 tablet 3   eplerenone (INSPRA) 25 MG tablet Take 1 tablet by mouth once daily 90 tablet 3   FEROSUL 325 (65 Fe) MG tablet Take 325 mg by mouth daily.     fluticasone (FLONASE) 50 MCG/ACT nasal spray Place 1-2 sprays into both nostrils daily as needed for allergies or rhinitis.     furosemide (LASIX) 40 MG tablet Take 1 tablet by mouth once daily 90 tablet 3   leflunomide (ARAVA) 20 MG tablet Take 20 mg by mouth daily.     levothyroxine (SYNTHROID) 25 MCG tablet Take 1 tablet (25 mcg total) by mouth daily before breakfast. 90 tablet 3   Melatonin 10 MG TABS Take 10 mg by mouth at bedtime as needed (sleep).      simvastatin (ZOCOR) 20 MG tablet TAKE 1 TABLET BY MOUTH EVERYDAY AT BEDTIME 90 tablet 3   No current facility-administered medications for this visit.    Allergies  Allergen Reactions   Atorvastatin Other (See Comments)    Muscle pain   Crestor [Rosuvastatin Calcium] Other (See Comments)    Muscle pain   Enalapril Other (See Comments)    Upset stomach   Lisinopril Cough   Losartan Other (See Comments)    Muscle pain    Telmisartan Other (See Comments)    Stomach ache     REVIEW OF SYSTEMS:   '[X]'$  denotes positive finding, '[ ]'$  denotes negative finding Cardiac  Comments:  Chest pain or chest pressure:    Shortness of breath upon exertion:    Short of breath when lying flat:     Irregular heart rhythm:        Vascular    Pain in calf, thigh, or hip brought on by ambulation:    Pain in feet at night that wakes you up from your sleep:     Blood clot in your veins:    Leg swelling:         Pulmonary    Oxygen at home:    Productive cough:     Wheezing:         Neurologic    Sudden weakness in arms or legs:     Sudden numbness in arms or legs:     Sudden onset of difficulty speaking or slurred speech:    Temporary loss of vision in one eye:     Problems with dizziness:         Gastrointestinal    Blood in stool:     Vomited blood:         Genitourinary    Burning when urinating:     Blood in urine:        Psychiatric    Major depression:         Hematologic    Bleeding problems:    Problems with blood clotting too easily:        Skin    Rashes or ulcers:        Constitutional    Fever or chills:      PHYSICAL EXAMINATION:  Vitals:   07/04/22 1004  BP: (!) 108/59  Pulse: (!) 59  Resp: 20  Temp: 98 F (36.7 C)  TempSrc: Temporal  SpO2: 98%  Weight: 154 lb (69.9 kg)  Height: '5\' 10"'$  (1.778 m)    General:  WDWN in NAD; vital signs documented above Gait: Not observed HENT: WNL, normocephalic Pulmonary: normal non-labored breathing , without Rales, rhonchi,  wheezing Cardiac: regular HR Abdomen: soft, NT, no masses Skin: without rashes Vascular Exam/Pulses: Palpable femorofemoral bypass pulse  Right Left  PT 2+ (normal) 2+ (normal)   Extremities: without ischemic changes, without Gangrene , without cellulitis; without open wounds;  Musculoskeletal: no muscle wasting or atrophy  Neurologic: A&O X 3;  No focal weakness or paresthesias are detected Psychiatric:  The pt has Normal affect.   Non-Invasive Vascular Imaging:   3.4 cm AAA  ABI/TBIToday's ABIToday's TBIPrevious ABIPrevious TBI  +-------+-----------+-----------+------------+------------+  Right 1.09       0.73       1.03        0.81           +-------+-----------+-----------+------------+------------+  Left  0.95       1.05       0.97        0.80          +-------+-----------+-----------+------------+------------+      ASSESSMENT/PLAN:: 77 y.o. male here for follow up for surveillance of PAD and AAA  -ABIs and TBI's remained stable.  Patient has a strong pulse in his femorofemoral bypass and has palpable pedal pulses.  He does not have any claudication, rest pain, or tissue loss.  We will recheck his femorofemoral and ABI in about 18 months.  AAA has increased in size minimally.  AAA now measures 3.4 cm by duplex.  We will recheck this in a couple years.  He will continue regular follow-up with PCP for management of chronic medical conditions.  He will call/return office sooner with any questions or concerns.   Dagoberto Ligas, PA-C [facility] [phone]  Clinic MD:   Scot Dock

## 2022-07-05 IMAGING — DX DG CHEST 2V
2 series · 2 of 2 positions shown · non-contrast
Comparison: Chest x-ray 06/06/2011.

CLINICAL DATA: Cardiac device.

EXAM:
CHEST - 2 VIEW

[w chest pa]
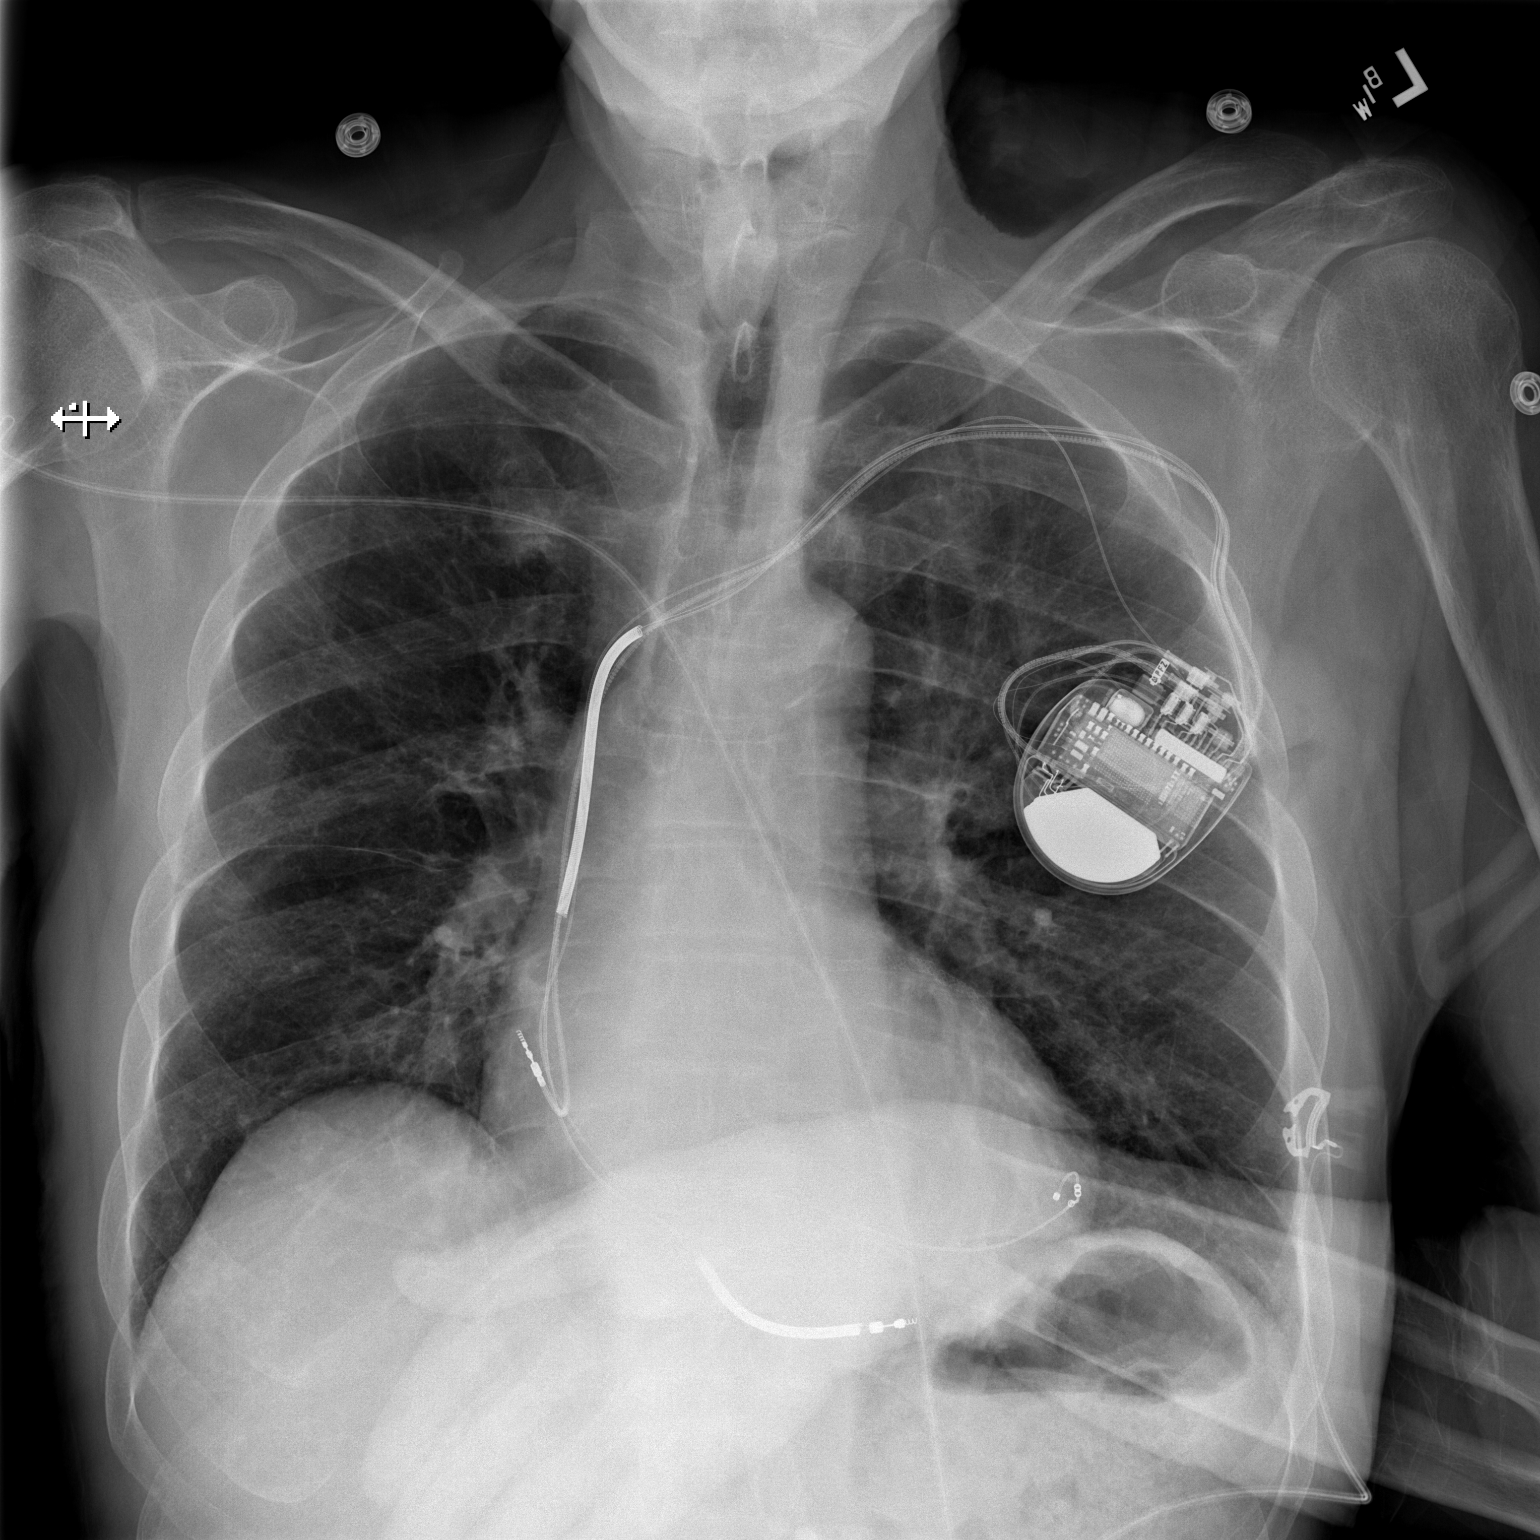

[w chest lat]
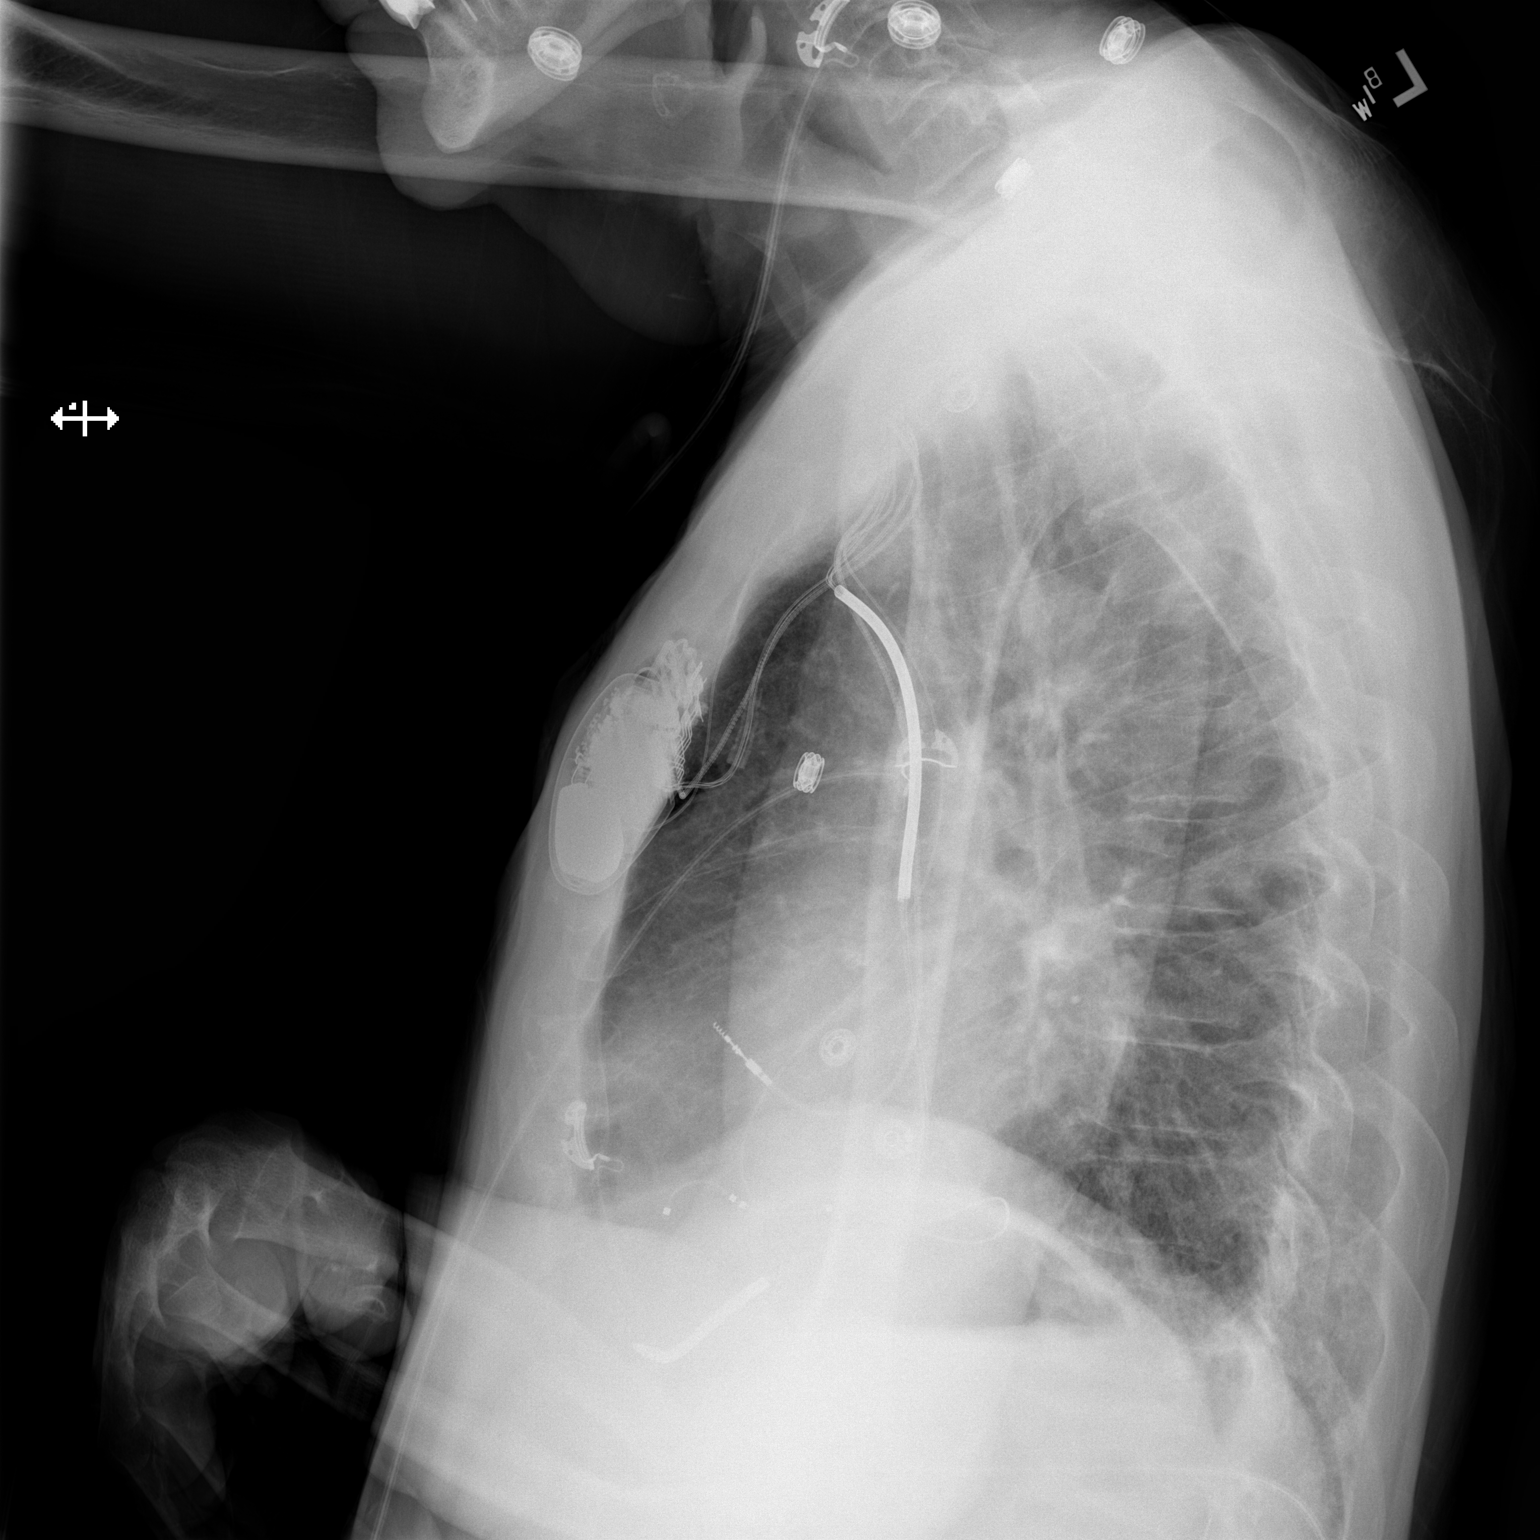

[2 of 2 positions shown; findings below may reference images not displayed]

FINDINGS: Left-sided ICD is present, new from prior. The cardiomediastinal
silhouette is within normal limits. Lungs and costophrenic angles
are clear. No pneumothorax. No acute fractures.
IMPRESSION: 1. New ICD in place.
2. No other acute cardiopulmonary process.

## 2022-07-11 ENCOUNTER — Ambulatory Visit: Payer: Medicare Other

## 2022-07-11 DIAGNOSIS — I440 Atrioventricular block, first degree: Secondary | ICD-10-CM | POA: Diagnosis not present

## 2022-07-11 LAB — CUP PACEART REMOTE DEVICE CHECK
Battery Remaining Longevity: 71 mo
Battery Voltage: 2.97 V
Brady Statistic AP VP Percent: 63.95 %
Brady Statistic AP VS Percent: 0.05 %
Brady Statistic AS VP Percent: 35.95 %
Brady Statistic AS VS Percent: 0.04 %
Brady Statistic RA Percent Paced: 63.19 %
Brady Statistic RV Percent Paced: 99.15 %
Date Time Interrogation Session: 20240307001703
HighPow Impedance: 37 Ohm
HighPow Impedance: 46 Ohm
Implantable Lead Connection Status: 753985
Implantable Lead Connection Status: 753985
Implantable Lead Connection Status: 753985
Implantable Lead Implant Date: 20100520
Implantable Lead Implant Date: 20100520
Implantable Lead Implant Date: 20230602
Implantable Lead Location: 753858
Implantable Lead Location: 753859
Implantable Lead Location: 753860
Implantable Lead Model: 4598
Implantable Lead Model: 5076
Implantable Lead Model: 6947
Implantable Pulse Generator Implant Date: 20230602
Lead Channel Impedance Value: 175.622
Lead Channel Impedance Value: 184.154
Lead Channel Impedance Value: 184.154
Lead Channel Impedance Value: 189.525
Lead Channel Impedance Value: 199.5 Ohm
Lead Channel Impedance Value: 285 Ohm
Lead Channel Impedance Value: 304 Ohm
Lead Channel Impedance Value: 342 Ohm
Lead Channel Impedance Value: 361 Ohm
Lead Channel Impedance Value: 399 Ohm
Lead Channel Impedance Value: 399 Ohm
Lead Channel Impedance Value: 399 Ohm
Lead Channel Impedance Value: 456 Ohm
Lead Channel Impedance Value: 608 Ohm
Lead Channel Impedance Value: 608 Ohm
Lead Channel Impedance Value: 665 Ohm
Lead Channel Impedance Value: 665 Ohm
Lead Channel Impedance Value: 665 Ohm
Lead Channel Pacing Threshold Amplitude: 0.625 V
Lead Channel Pacing Threshold Amplitude: 1.5 V
Lead Channel Pacing Threshold Pulse Width: 0.4 ms
Lead Channel Pacing Threshold Pulse Width: 0.4 ms
Lead Channel Sensing Intrinsic Amplitude: 1.625 mV
Lead Channel Sensing Intrinsic Amplitude: 1.625 mV
Lead Channel Sensing Intrinsic Amplitude: 15.75 mV
Lead Channel Sensing Intrinsic Amplitude: 15.75 mV
Lead Channel Setting Pacing Amplitude: 1.5 V
Lead Channel Setting Pacing Amplitude: 2 V
Lead Channel Setting Pacing Amplitude: 2.25 V
Lead Channel Setting Pacing Pulse Width: 0.4 ms
Lead Channel Setting Pacing Pulse Width: 0.8 ms
Lead Channel Setting Sensing Sensitivity: 0.3 mV

## 2022-07-25 ENCOUNTER — Ambulatory Visit: Payer: Medicare Other | Admitting: Pulmonary Disease

## 2022-07-25 ENCOUNTER — Encounter: Payer: Self-pay | Admitting: Pulmonary Disease

## 2022-07-25 VITALS — BP 114/72 | HR 58 | Temp 97.8°F | Ht 70.0 in | Wt 149.6 lb

## 2022-07-25 DIAGNOSIS — J84112 Idiopathic pulmonary fibrosis: Secondary | ICD-10-CM | POA: Diagnosis not present

## 2022-07-25 NOTE — Progress Notes (Signed)
Nathan Pruitt    ZK:[id number]    1945-07-05  Primary Care Physician:Holt, Gara Kroner, MD  Referring Physician: Ronita Hipps, MD [place] [id number],   Chief complaint: Follow-up for IPF  HPI: 77 y.o.  ex-smoker with history of coronary artery disease, peripheral artery disease, ischemic cardiomyopathy, V. tach status post ICD, hypertension, hyperlipidemia.  Referred by Dr. Aundra Dubin for evaluation of pulmonary fibrosis, interstitial lung disease.  He has history of V. tach in 2018, diagnosed with coronary artery disease status post stenting, cardiomyopathy with EF 40%.  He is being on amiodarone since 2018.  He has progressive dyspnea and underwent repeat repeat cath in September 2021 with in-stent restenosis just treated with balloon angioplasty.  There is also concern for amiodarone toxicity given changes on PFTs and CT scan and amiodarone has been held and referred to pulmonary for further evaluation  Has chronic dyspnea on exertion.  He admits that he is a slug and does not exercise very well.  Denies any cough, sputum production, fevers, chills  We discussed antifibrotic's but he is not interested in trying.  Labs show positive hypersensitivity panel but he does not note any exposures.  We had advised getting his house and recommended to start  Pets: No pets Occupation: Retired Dance movement psychotherapist Exposures: No known exposures.  No mold, hot tub, Jacuzzi.  No feather pillows or comforters Smoking history: 47-pack-year smoker.  Quit in 2013 Travel history: No significant travel history Relevant family history: No significant travel history of lung disease  Interim history: Stable with regard to breathing.  Here for review of CT scan  Outpatient Encounter Medications as of 07/25/2022  Medication Sig   acetaminophen (TYLENOL) 500 MG tablet Take 500-1,000 mg by mouth every 6 (six) hours as needed (for pain.).   amiodarone (PACERONE) 200 MG tablet TAKE  1/2 TABLET BY MOUTH DAILY   carvedilol (COREG) 12.5 MG tablet Take 1 tablet (12.5 mg total) by mouth 2 (two) times daily.   chlorhexidine (PERIDEX) 0.12 % solution Use as directed 15 mLs in the mouth or throat daily as needed (irritation).    clopidogrel (PLAVIX) 75 MG tablet Take 1 tablet by mouth once daily with breakfast   eplerenone (INSPRA) 25 MG tablet Take 1 tablet by mouth once daily   fluticasone (FLONASE) 50 MCG/ACT nasal spray Place 1-2 sprays into both nostrils daily as needed for allergies or rhinitis.   furosemide (LASIX) 40 MG tablet Take 1 tablet by mouth once daily   leflunomide (ARAVA) 20 MG tablet Take 20 mg by mouth daily.   levothyroxine (SYNTHROID) 25 MCG tablet Take 1 tablet (25 mcg total) by mouth daily before breakfast.   Melatonin 10 MG TABS Take 10 mg by mouth at bedtime as needed (sleep).    simvastatin (ZOCOR) 20 MG tablet TAKE 1 TABLET BY MOUTH EVERYDAY AT BEDTIME   [DISCONTINUED] FEROSUL 325 (65 Fe) MG tablet Take 325 mg by mouth daily.   No facility-administered encounter medications on file as of 07/25/2022.    Physical Exam: Blood pressure 114/72, pulse (!) 58, temperature 97.8 F (36.6 C), temperature source Oral, height 5\' 10"  (1.778 m), weight 149 lb 9.6 oz (67.9 kg), SpO2 97 %. Gen:      No acute distress HEENT:  EOMI, sclera anicteric Neck:     No masses; no thyromegaly Lungs:    Clear to auscultation bilaterally; normal respiratory effort CV:  Regular rate and rhythm; no murmurs Abd:      + bowel sounds; soft, non-tender; no palpable masses, no distension Ext:    No edema; adequate peripheral perfusion Skin:      Warm and dry; no rash Neuro: alert and oriented x 3 Psych: normal mood and affect   Data Reviewed: Imaging: High-res CT chest 03/07/2020-patchy areas of groundglass attenuation with septal thickening, reticulation and traction bronchiectasis with basal gradient.  No honeycombing.  Probable UIP pattern.   High-res CT  04/16/2021-pulmonary fibrosis and probable UIP pattern.  Mildly progressive compared to prior  High resolution CT scan 07/05/2022-stable pattern of pulmonary fibrosis and probable UIP pattern, bronchial wall thickening with fine centrilobular nodularity, emphysema I have reviewed the images personally.  PFTs: 01/28/2020 FVC 3.28 [9 7%], FEV1 2.45 [10 9%], F/F 75, TLC 5.62 [39%], DLCO 13.54 [53%] Minimal restriction with moderate-severe diffusion defect  07/23/2021 FVC 3.47 [82%], FEV1 2.72 [89%], F/F 78, TLC 5.60 [79%], DLCO 12.15 [48%] Minimal restriction moderate-severe diffusion defect  Labs: Sed rate 03/15/2020-45  CTD serologies 04/06/2020-negative except for positive hypersensitivity panel for Thermoact. 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  He does not have significant exposure history or connective tissue disease symptoms. 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 However I agree with stopping amiodarone if he does not absolutely need it for his history of V. tach. Hypersensitivity panel is positive but he does not note any exposures.  CT and PFTs reviewed.  His pulmonary fibrosis is stable.  The latest CT scan does shows subtle centrilobular nodularity in the upper lobes which could represent smoking-related lung disease but he quit smoking many years ago.  There are no exposures to suggest hypersensitivity pneumonitis though we did note a positive HP panel in 2021  I had an extensive discussion with patient.  He is not a great candidate for lung biopsy or invasive procedures such as bronchoscopy besides patient would like to avoid these.  Discussed antifibrotic therapy but he would like to hold off due to expense.  He is skeptical of using antifibrotics and similar expensive medications if it does not improve his quality of life.  Follow-up CT in 1  year  Brittle nails Does not look obviously like clubbing to me.  Recommended trying multivitamins to see if that will improve  Plan/Recommendations: High-resolution CT in 1 year  Marshell Garfinkel MD [facility] 07/25/2022, 8:54 AM  CC: Ronita Hipps, MD

## 2022-07-25 NOTE — Patient Instructions (Signed)
We reviewed his CT today I am glad that your breathing is doing well Will order a follow-up high-resolution CT in 1 year at [place] Return to clinic in 1 year.

## 2022-08-13 NOTE — Progress Notes (Signed)
Remote ICD transmission.   

## 2022-09-23 ENCOUNTER — Other Ambulatory Visit (HOSPITAL_COMMUNITY): Payer: Self-pay | Admitting: Cardiology

## 2022-10-10 ENCOUNTER — Ambulatory Visit (INDEPENDENT_AMBULATORY_CARE_PROVIDER_SITE_OTHER): Payer: Medicare Other

## 2022-10-10 DIAGNOSIS — I440 Atrioventricular block, first degree: Secondary | ICD-10-CM

## 2022-10-10 LAB — CUP PACEART REMOTE DEVICE CHECK
Battery Remaining Longevity: 69 mo
Battery Voltage: 2.95 V
Brady Statistic AP VP Percent: 61.41 %
Brady Statistic AP VS Percent: 0.19 %
Brady Statistic AS VP Percent: 38.33 %
Brady Statistic AS VS Percent: 0.07 %
Brady Statistic RA Percent Paced: 60.8 %
Brady Statistic RV Percent Paced: 99.17 %
Date Time Interrogation Session: 20240606022703
HighPow Impedance: 37 Ohm
HighPow Impedance: 48 Ohm
Implantable Lead Connection Status: 753985
Implantable Lead Connection Status: 753985
Implantable Lead Connection Status: 753985
Implantable Lead Implant Date: 20100520
Implantable Lead Implant Date: 20100520
Implantable Lead Implant Date: 20230602
Implantable Lead Location: 753858
Implantable Lead Location: 753859
Implantable Lead Location: 753860
Implantable Lead Model: 4598
Implantable Lead Model: 5076
Implantable Lead Model: 6947
Implantable Pulse Generator Implant Date: 20230602
Lead Channel Impedance Value: 175.622
Lead Channel Impedance Value: 184.154
Lead Channel Impedance Value: 188.1 Ohm
Lead Channel Impedance Value: 193.707
Lead Channel Impedance Value: 204.14 Ohm
Lead Channel Impedance Value: 285 Ohm
Lead Channel Impedance Value: 304 Ohm
Lead Channel Impedance Value: 342 Ohm
Lead Channel Impedance Value: 361 Ohm
Lead Channel Impedance Value: 399 Ohm
Lead Channel Impedance Value: 418 Ohm
Lead Channel Impedance Value: 456 Ohm
Lead Channel Impedance Value: 456 Ohm
Lead Channel Impedance Value: 608 Ohm
Lead Channel Impedance Value: 646 Ohm
Lead Channel Impedance Value: 703 Ohm
Lead Channel Impedance Value: 703 Ohm
Lead Channel Impedance Value: 703 Ohm
Lead Channel Pacing Threshold Amplitude: 0.75 V
Lead Channel Pacing Threshold Amplitude: 1.375 V
Lead Channel Pacing Threshold Pulse Width: 0.4 ms
Lead Channel Pacing Threshold Pulse Width: 0.4 ms
Lead Channel Sensing Intrinsic Amplitude: 1.625 mV
Lead Channel Sensing Intrinsic Amplitude: 1.625 mV
Lead Channel Sensing Intrinsic Amplitude: 4.875 mV
Lead Channel Sensing Intrinsic Amplitude: 4.875 mV
Lead Channel Setting Pacing Amplitude: 1.5 V
Lead Channel Setting Pacing Amplitude: 2 V
Lead Channel Setting Pacing Amplitude: 2.25 V
Lead Channel Setting Pacing Pulse Width: 0.4 ms
Lead Channel Setting Pacing Pulse Width: 0.8 ms
Lead Channel Setting Sensing Sensitivity: 0.3 mV

## 2022-10-30 NOTE — Progress Notes (Signed)
Remote ICD transmission.   

## 2022-10-31 ENCOUNTER — Telehealth: Payer: Self-pay

## 2022-10-31 NOTE — Telephone Encounter (Signed)
Patient would like to schedule his apt for sept. Follow up. Prefer morning apt.

## 2022-10-31 NOTE — Telephone Encounter (Signed)
Pt wants nurse to go over 10/10/2022 transmission. I let him speak with Leigh.

## 2022-12-13 ENCOUNTER — Other Ambulatory Visit (HOSPITAL_COMMUNITY): Payer: Self-pay | Admitting: Cardiology

## 2022-12-19 ENCOUNTER — Telehealth (HOSPITAL_COMMUNITY): Payer: Self-pay | Admitting: Pharmacy Technician

## 2022-12-19 ENCOUNTER — Ambulatory Visit (HOSPITAL_COMMUNITY)
Admission: RE | Admit: 2022-12-19 | Discharge: 2022-12-19 | Disposition: A | Discharge status: Home / Self Care | Payer: Medicare Other | Source: Ambulatory Visit | Attending: Cardiology | Admitting: Cardiology

## 2022-12-19 ENCOUNTER — Other Ambulatory Visit (HOSPITAL_COMMUNITY): Payer: Self-pay

## 2022-12-19 ENCOUNTER — Encounter (HOSPITAL_COMMUNITY): Payer: Self-pay | Admitting: Cardiology

## 2022-12-19 VITALS — BP 106/63 | HR 61 | Wt 147.0 lb

## 2022-12-19 DIAGNOSIS — I5022 Chronic systolic (congestive) heart failure: Secondary | ICD-10-CM

## 2022-12-19 NOTE — Telephone Encounter (Signed)
Advanced Heart Failure Patient Advocate Encounter  Patient agreed to try and get assistance for Farxiga through [employer]. Sent docusign link for signature. If approved, medlist will be updated (based off income provided, patient is eligible for assistance).  Will follow up.

## 2022-12-19 NOTE — Progress Notes (Signed)
Heart Failure TeleHealth Note  Due to national recommendations of social distancing due to COVID 19, Audio/video telehealth visit is felt to be most appropriate for this patient at this time.  See MyChart message from today for patient consent regarding telehealth for [name].  Date:  12/19/2022   ID:  Nathan Pruitt, DOB Jul 19, 1945, MRN [id number]  Location: Home  Provider location: [place] [facility] Type of Visit: Established patient   PCP:  Marylen Ponto, MD  Cardiologist:  Marca Ancona, MD   History of Present Illness: Nathan Pruitt is a 77 y.o. male with a history of history of CAD, PAD, ischemic CMP, and VT s/p ICD.   He presents via Web designer for a telehealth visit today.     He denies symptoms worrisome for COVID 19.    Echo in 2/17 showed EF 40% and regional WMAs.   In 3/18, he developed episodes of VT as well as increased dyspnea.  LHC/RHC was done, showing new occlusion of a large ramus (CTO, probably a month or so old at time of cath) and old CTO RCA with collaterals.  He had DES x 3 to ramus.  Filling pressures were optimized on RHC.  Amiodarone was increased to 200 mg daily from 100 mg daily.  No VT since PCI.    Echo in 6/19 showed EF 35-40% with wall motion abnormalities. Echo in 1/21 showed EF 40-45% with basal to mid inferolateral and inferior akinesis, mild MR, normal RV, PASP 35 mmHg.   LHC/RHC in 9/21 with normal filling pressures and cardiac output but 95% OM1 stenosis.  This was treated with DES to OM1.   He has been following with Dr. Isaiah Serge with idiopathic pulmonary fibrosis.  I was concerned that he could have amiodarone -related lung toxicity but Dr. Isaiah Serge did not think this was very likely.  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     Labs drawn in 1/22 showed K up to 5.9 and creatinine 4.  He was sent to the ER at [facility].  He was found to have COVID-19 and was admitted.  He had a productive cough and sore throat.  He was thought to be dehydrated and got IV fluid.  Creatinine 2.3 at discharge.  Lasix, valsartan, and eplerenone were stopped.  Other meds were continued.  Echo in 6/22 showed EF 30-35%, basal-mid inferior and inferolateral akinesis, anterolateral hypokinesis, moderate MR (appears infarct-related), mildly decreased RV function.   In 6/23, he had upgrade of device to CRT-D given frequent RV pacing.  Echo in 2/24 showed EF 25-30% with anterolateral/inferolateral akinesis, basal-mid inferior akinesis, mild RV dysfunction, mild-moderate MR.   Exertional symptoms have gradually worsened.  He feels like his stamina is getting worse.  He fatigues faster when walking outside especially.  Also notes dyspnea walking around his house or up his driveway.  No chest pain. No orthopnea/PND.  Weight down about 6 lbs. He is historically not very active.     Labs (11/10): K 4.9, creatinine 1.4, LDL 70, HDL 31, LFTs normal Labs (0/27): K 4.4, creatinine 1.3, LDL 61, HDL 35 Labs (7/11): LDL 62, HDL 23 Labs (1/12): K 4.8, LFTs normal, creatinine 1.44, LDL 69, HDL 33 Labs (7/12): LFTs normal, LDL 81, HDL 36, HCT normal, K 4.3, creatinine 1.3, BNP 116, TSH normal Labs (1/13): LDL 39, HDl 31, LFTs normal, TSH  Labs (7/13): TSH normal, LFTs normal, LDL 69, HDL 40 Labs (  8/13): K 4.5, creatinine 1.6 Labs (10/13): K 4.4, creatinine 1.4 Labs (3/14): LDL 108, HDL 30, LFTs normal Labs (2/44): K 4.2, creatinine 1.6, LFTs normal, LDL 114, TSH 10.6 (elevated), free T4/free T3 normal Labs (2/15): K 4.6, creatinine 1.5, LFTs normal, TSH 7.6 (elevated), free T4 and free T3 normal Labs (3/15): K 3.6, creatinine 1.5, LFTs normal, TSH normal Labs (9/15): K 4.3, creatinine 1.6, LFTs normal, TSH normal, LDL 115, HDL 24 Labs (10/16): K 4.5, creatinine 1.5, LDL 90, HDL 35, hgb 12.8 Labs (11/16): Low free T3, normal TSH and free T4 Labs (12/16): K 5, creatinine 1.8, LFTs normal, TSH 8.6  (mildly elevated) Labs (1/17): Free T4/TSH/free T3 normal, LFTs normal Labs (2/17): K 4.4, creatinine 2.4, LDL 131, HDL 34 Labs (4/17): K 4.2, creatinine 1.3, LDL 67, HDL 35 Labs (9/17): K 4.9, creatinine 2.2, LDL 54, HDL 35, free T3 and T4 normal, hgb 12 Labs (4/18): K 5, creatinine 1.78, LFTs normal, TSH elevated but free T3 and free T4 normal.  Labs (6/18): LDL 52, HDL 35, TSH elevated 6.8, LFTs normal Labs (7/18): K 4.3, creatinine 1.68 Labs (10/18): TSH 5.6, free T3 normal, free T4 normal, LFTs normal, K 4.9, creatinine 1.4 Labs (11/18): K 4.5, creatinine 1.46, LFTs normal Labs (4/19): K 4.8, creatinine 1.6, LFTs normal Labs (6/19): TSH slightly elevated, free T3/T4 both normal, LFTs normal Labs (7/19): K 4.3, creatinine 1.3 Labs (10/19): LFTs normal, LDL 58, TGs 165, TSH 6.6 but free T4 and free T3 normal Labs (1/20): K 4.7, creatinine 1.7 Labs (2/20): LFTs normal Labs (4/20): K 4.6, creatinine 1.4, LDL 77, HDL 36, hgb 12.2, TSH mild elevation 7.4, free T4 and free T3 normal.  Labs (7/20): K 3.8, creatinine 1.6, hgb 12.2, LDL 81, TGs 158, HDL 35, TSH 7.7 (mildly elevated), LFTs normal Labs (11/20): K 4.5, creatinine 1.7, NT-proBNP 1358, TSH 5.6 (mild elevation), LDL 88, LFTs normal Labs (0/10): K 4.3, creatinine 1.6, hgb 12.4, LFTs, normal, TGs 105, LDL 81, HDL 40, TSH 5.6 (mildly elevated), free T3 and free T4 normal Labs (6/21): K 4.7, creatinine 1.9, AST/ALT normal, TSH mildly elevated at 7.9, free T3 and T4 normal, LDL 87, TGs 118 Labs (10/21): K 4.4, creatinine 1.8, LFTs normal, LDL 89, pro-BNP 1830, LFTs normal Labs (11/21): ESR 48, K 4.6, creatinine 1.96 Labs (12/21): ANA negative Labs (1/22): K 5.9, creatinine 4 => 2.3 Labs (2/22): K 4.6, creatinine 2.1 => 2.2, LDL 77, LFTs normal Labs (2/72): K 4.7,creatinine 2.7, TSH mildly elevated but free T3 and free T4 normal, hgb 10.2, LDL 67, LFTs normal Labs (5/36): creatinine 2.0 Labs (6/22): K 4.7, creatinine 2.4 Labs (9/22): K  4.4, creatinine 2.6, LFTs normal, hgb 9.9, LDL 62, TSH 5.94, free T4 normal, free T3 low.  Labs (10/22): TSH 4.99 Labs (12/22): LDL 57 Labs (3/23): TSH normal, free T3 and free T4 normal, K 4.7, creatinine 2.3, LFTs normal, LDL 61 Labs (6/23): hgb 10.1, K 5.1, creatinine 2.1 Labs (7/23): LDL 72, LFTs normal Labs (6/44): hgb 11.2, K 4.7, creatinine 2.2, LFTs normal, transferrin saturation 21% Labs (1/24): K 4.5, creatinine 2.3, LFTs normal, TSH slightly elevated Labs (8/24): K 4.8, creatinine 2.3, LFTs normal, LDL 76   Allergies (verified):  No Known Drug Allergies   Past Medical History: 1. Coronary artery disease. The patient reports history of silent MI in 1993.  This was likely an inferior MI (see below).  - The patient presented to [place] in 5/10 with VT and mildly elevated cardiac  enzymes. LHC (5/10):  Inferobasal dyskinesis with EF 35-40%.  There was chronic total occlusion of the mid RCA with good collaterals.  Luminals LCA.  This did not appear to be an acute cause of the 5/10 event.  - LHC (3/18): Known occlusion of RCA with collaterals, new occlusion of ramus.  DES x 3 to ramus.  - LHC (9/21): Known occlusion RCA with collaterals, 95% OMI treated with DES.  2. Hypertension.  3. Hyperlipidemia: Intolerant of higher doses of simvastatin, Crestor, Lipitor, pravastatin. Zetia caused constipation.  4. Remote tobacco abuse with 47-pack-year history, quitting in August 2009.  5. Peripheral arterial disease.  - Status post left-to-right fem-fem bypass performed in [place] in March 2009.  - Status post redo left-to-right fem-fem bypass performed by Dr. Edilia Bo at [facility] in 2009.  - ABIs normal 3/15, ABIs normal 3/16, ABIs normal 3/17, ABIs normal 4/18, ABIs normal 4/19, ABIs normal 12/20. ABIs stable 3/24.  6. Rheumatoid arthritis, on leflunomide.  7.  Ischemic cardiomyopathy:  EF 35-40% by LV-gram 5/10 with inferobasal dyskinesis.  Echo 5/10 showed EF 40% with mild LVH, no  significant MR, inferobasal and posterobasal akinesis.  Echo (7/11): EF 50%, mild LVH, basal-mid inferoposterior akinesis.  Echo (7/12): EF 45-50% with basal anterolateral, basal posterior, and basal to mid inferior akinesis.  Echo (4/16): EF 40-45%, basal to mid inferolateral AK, basal inferior AK, basal to mid anterolateral HK.  Echo (2/17): EF 40% with basal to mid inferior akinesis, basal inferolateral aneurysm, mid inferolateral akinesis, basal anterolateral hypokinesis, normal RV size and systolic function, PASP 25 mmHg.  - Echo (3/18) with EF 30-35%, moderate LV dilation, moderate MR, mildly decreased RV systolic function, PASP 51 mmmHg. - RHC (3/18): mean RA 4, PA 38/12, mean PCWP 17, CI 2.2.  - Echo (6/19): EF 35-40%, inferior/inferolateral/anterolateral WMAs, moderate MR likely infarct-related, normal RV size with mildly decreased systolic function.  - Echo (1/21): EF 35-40% with wall motion abnormalities. Echo was done today and reviewed, EF 40-45% with basal to mid inferolateral and inferior akinesis, mild MR, normal RV, PASP 35 mmHg.  - RHC (9/21): mean RA 4, PA 33/12, mean PCWP 9, CI 3.12 - Echo (6/22): EF 30-35%, basal-mid inferior and inferolateral akinesis, anterolateral hypokinesis, moderate MR (appears infarct-related), mildly decreased RV function.  - CRT-D upgrade (Medtronic) in 6/23.  - Echo (2/24): EF 25-30% with anterolateral/inferolateral akinesis, basal-mid inferior akinesis, mild RV dysfunction, mild-moderate MR. 8.  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  He has a Medtronic CRT-D device.  9.  Atrial flutter:  Status post isthmus ablation 5/10.   10. Restrictive lung defect: PFTs (7/11): FVC 74%, FEV1 80%, ratio 75%, TLC 78%, DLCO 68%.  This suggests a mild restrictive defect and a mild obstructive defect.  He did have response to bronchodilator. These PFTs were significantly better than the report from Dr. Blenda Nicely in  [place] done prior.  He had last PFTs in 2/12 with no significant change.  - PFTs (9/21) with significantly decreased DLCO and concern for ILD/pulmonary fibrosis.  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  11.  Anxiety 12.  Chronic cough: No relief with change from ACEI to ARB or with trial of PPI.  13.  CKD: Stage 3.  14. Mitral regurgitation: Moderate on 6/22 echo, suspect infarct-related.  15. AAA: 3.1 cm AAA on abdominal US 12/20  - Abdominal US 5/22 with 3.1 cm AAA - Abdominal US 2/24 with 3.4  cm AAA 16. COVID-19 infection: 1/22.   Current Outpatient Medications  Medication Sig Dispense Refill   acetaminophen (TYLENOL) 500 MG tablet Take 500-1,000 mg by mouth every 6 (six) hours as needed (for pain.).     amiodarone (PACERONE) 200 MG tablet Take 1/2 (one-half) tablet by mouth once daily 45 tablet 3   carvedilol (COREG) 12.5 MG tablet Take 1 tablet (12.5 mg total) by mouth 2 (two) times daily. 180 tablet 3   chlorhexidine (PERIDEX) 0.12 % solution Use as directed 15 mLs in the mouth or throat daily as needed (irritation).   3   clopidogrel (PLAVIX) 75 MG tablet Take 1 tablet by mouth once daily with breakfast 90 tablet 3   eplerenone (INSPRA) 25 MG tablet Take 1 tablet by mouth once daily 90 tablet 3   fluticasone (FLONASE) 50 MCG/ACT nasal spray Place 1-2 sprays into both nostrils daily as needed for allergies or rhinitis.     furosemide (LASIX) 40 MG tablet Take 1 tablet by mouth once daily 90 tablet 3   leflunomide (ARAVA) 20 MG tablet Take 20 mg by mouth daily.     levothyroxine (SYNTHROID) 25 MCG tablet TAKE 1 TABLET BY MOUTH ONCE DAILY BEFORE BREAKFAST 90 tablet 3   Melatonin 10 MG TABS Take 10 mg by mouth at bedtime as needed (sleep).      simvastatin (ZOCOR) 20 MG tablet TAKE 1 TABLET BY MOUTH EVERYDAY AT BEDTIME 90 tablet 3   No current facility-administered medications for this encounter.    Allergies:   Atorvastatin, Crestor [rosuvastatin  calcium], Enalapril, Lisinopril, Losartan, and Telmisartan   Social History:  The patient  reports that he quit smoking about 15 years ago. His smoking use included cigarettes. He started smoking about 62 years ago. He has a 47 pack-year smoking history. He has never been exposed to tobacco smoke. He has never used smokeless tobacco. He reports current alcohol use. He reports that he does not use drugs.   Family History:  The patient's family history includes Gastric cancer in his mother; Heart attack (age of onset: 21) in his brother; Peripheral vascular disease in his father; Throat cancer in his father.   ROS:  Please see the history of present illness.   All other systems are personally reviewed and negative.   Exam:   BP 106/63   Pulse 61   Wt 66.7 kg (147 lb)   BMI 21.09 kg/m  Exam:  (Video/Tele Health Call; Exam is subjective and or/visual.) General:  Speaks in full sentences. No resp difficulty. Neck: No JVD seen Lungs: Normal respiratory effort with conversation.  Abdomen: Non-distended per patient report Extremities: Pt denies edema. Neuro: Alert & oriented x 3.   Recent Labs: No results found for requested labs within last 365 days.  Personally reviewed   Wt Readings from Last 3 Encounters:  12/19/22 66.7 kg (147 lb)  07/25/22 67.9 kg (149 lb 9.6 oz)  07/04/22 69.9 kg (154 lb)     ASSESSMENT AND PLAN:  1. Chronic systolic CHF: Ischemic cardiomyopathy.  Medtronic CRT-D device.  Echo in 6/22 with EF 30-35%, mild RV dysfunction. Upgraded to CRT device given high percentage of RV pacing.  Echo 2/24 showed EF 25-30% with anterolateral/inferolateral akinesis, basal-mid inferior akinesis, mild RV dysfunction, mild-moderate MR.  Chronic NYHA class III symptoms, slowly worsening.  He is not obviously volume overloaded and weight is down.  Deconditioning/inactivity could play a role.  CKD stage 3 limits med titration.  - Continue Coreg 12.5 mg  bid.       - Continue Lasix 40 mg  daily, recent creatinine 2.3.   - Continue eplerenone 25 mg daily. Will not increase with high normal K.  - He is off valsartan for now with elevated creatinine, may be able to start back on low dose in the future.    - He did not tolerate Jardiance.  Marcelline Deist has been too expensive in the past.  We will try again to see if we can get him Marcelline Deist, if not could see if there is an adequate patient assistance program with sotagliflozin.  2. CAD: Denies chest pain.  Had DES x 3 to ramus in 3/18 and DES to OM1 in 9/21.   - Continue Zocor, lipids ok in 8/24.  - Continue Plavix long-term, off ASA.    3. Hyperlipidemia: Stable on simvastatin without myalgias.  Has not been able to tolerate higher dose of simvastatin or other statins. Unable to tolerate Zetia due to constipation.  Lipids acceptable in 8/24.  4. Hx of VT: Has been on amiodarone 100 mg daily. Has ICD.  He did not tolerate sotalol. Recent LFTs were normal.   - He is on Levoxyl for amiodarone-induced mild hypothyroidism.   - Knows to get a yearly eye exam - See below regarding ?amiodarone lung toxicity.  5. PAD: He denies claudication.  Stable ABIs 2/24 at [facility]. 6. AAA: 3.4 cm in 2/24, followed by [facility].  7. CKD: Stage 3.  Last creatinine 2.3.  - Follows with nephrology now.   8. Pulmonary: PFTs were done with increased dyspnea and amiodorone use, concerning for ILD/pulmonary fibrosis.  High resolution CT chest was concerning for ILD.  I have worried about amiodarone-induced lung toxicity. 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  He was offered antifibrotic treatment but wants to hold off for now. I think that some of his symptomatology is due to IPF.  - Can continue amiodarone 100 mg daily as above for VT suppression.  - Pulmonary followup with Dr. Isaiah Serge.    COVID screen The patient does not have any symptoms that suggest any further testing/ screening at this time.  Social distancing  reinforced today.  Patient Risk: After full review of this patients clinical status, I feel that they are at moderate risk for cardiac decompensation at this time.  Relevant cardiac medications were reviewed at length with the patient today. The patient does not have concerns regarding their medications at this time.   Recommended follow-up:  1-2 months in office with CPX.   Today, I have spent 20 minutes with the patient with telehealth technology discussing the above issues .    Signed, Marca Ancona, MD  12/19/2022   [facility] [address] [facility] [place] [address] [phone] (office) [phone] (fax)

## 2022-12-19 NOTE — Patient Instructions (Signed)
Expect to be in the lab for 2 hours. Please plan to arrive 30 minutes prior to your appointment. You may be asked to reschedule your test if you arrive 20 minutes or more after your scheduled appointment time.  Main Campus address: [address] [place], [place] [address] You may arrive to the Main Entrance A or Entrance C (free valet parking is available at both). -Main Entrance A (on [address]) :proceed to admitting for check in -Entrance C (on [facility]): proceed to [employer] parking or under hospital deck parking using this code _________  Check In: [facility] waiting room (1st floor)   General Instructions for the day of the test (Please follow all instructions from your physician): Refrain from ingesting a heavy meal, alcohol, or caffeine or using tobacco products within 2 hours of the test (DO NOT FAST for mare than 8 hours). You may have all other non-alcoholic, non -caffeinated beverage,a light snack (crackers,a piece of fruit, carrot sticks, toast bagel,etc) up to your appointment. Avoid significant exertion or exercise within 24 hours of your test. Be prepared to exercise and sweat. Your clothing should permit freedom of movement and include walking or running shoes. Women bring loose fitting short sleeved blouse.  This evaluation may be fatiguing and you may wish ti have someone accompany you to the assessment to drive you home afterward. Bring a list of your medications with you, including dosage and frequency you take the medications (  I.e.,once per day, twice per day, etc). Take all medications as prescribed, unless noted below or instructed to do so by your physician.  Please do not take the following medications prior to your CPX:  _________________________________________________  _________________________________________________  Brief description of the test: A brief lung test will be performed. This will involve you taking deep breaths and blowing hard  and fast through your mouth. During these , a clip will be on your nose and you will be breathing through a breathing device.   For the exercise portion of the test you will be walking on a treadmill, or riding a stationary bike, to your maximal effor or until symptoms such as chest pain, shortness of breath, leg pain or dizziness limit your exercise. You will be breathing in and out of a breathing device through your mouth (a clip will be on your nose again). Your heart rate, ECG, blood pressure, oxygen saturations, breathing rate and depth, amount of oxygen you consume and amount of carbon dioxide you produce will be measured and monitored throughout the exercise test.  If you need to cancel or reschedule your appointment please call [phone] If you have further questions please call your physician or Philip Aspen, MS, ACSM-RCEP at [phone]  Your physician recommends that you schedule a follow-up appointment in: October with Dr. Shirlee Latch and CPX the same day.  If you have any questions or concerns before your next appointment please send [place] a message through [place] or call our office at [phone].    TO LEAVE A MESSAGE FOR THE NURSE SELECT OPTION 2, PLEASE LEAVE A MESSAGE INCLUDING: YOUR NAME DATE OF BIRTH CALL BACK NUMBER REASON FOR CALL**this is important as we prioritize the call backs  YOU WILL RECEIVE A CALL BACK THE SAME DAY AS LONG AS YOU CALL BEFORE 4:00 PM  At the [facility], you and your health needs are our priority. As part of our continuing mission to provide you with exceptional heart care, we have created designated Provider  Care Teams. These Care Teams include your primary Cardiologist (physician) and Advanced Practice Providers (APPs- Physician Assistants and Nurse Practitioners) who all work together to provide you with the care you need, when you need it.   You may see any of the following providers on your designated Care Team at your next  follow up: Dr Arvilla Meres Dr Marca Ancona Dr. Marcos Eke, NP Robbie Lis, [place] [facility] [place], [place] Brynda Peon, NP Karle Plumber, PharmD   Please be sure to bring in all your medications bottles to every appointment.    Thank you for choosing [facility]

## 2022-12-20 NOTE — Telephone Encounter (Signed)
Advanced Heart Failure Patient Advocate Encounter  Sent in application via fax. Document scanned to chart.   Will follow up. 

## 2022-12-23 ENCOUNTER — Other Ambulatory Visit (HOSPITAL_COMMUNITY): Payer: Self-pay | Admitting: Cardiology

## 2022-12-23 MED ORDER — DAPAGLIFLOZIN PROPANEDIOL 10 MG PO TABS: 10.0000 mg | ORAL_TABLET | Freq: Every day | ORAL | Status: DC

## 2022-12-23 NOTE — Telephone Encounter (Signed)
Advanced Heart Failure Patient Advocate Encounter   Patient was approved to receive Farxiga from [employer]  Effective dates: 12/20/22 through 05/06/23  Document scanned to chart. Called and spoke with the patient. Asked Chantel (CMA) to update medlist. Routed to provider.  Archer Asa, CPhT

## 2022-12-23 NOTE — Addendum Note (Signed)
Addended by: Theresia Bough on: 12/23/2022 02:54 PM   Modules accepted: Orders

## 2023-01-09 ENCOUNTER — Ambulatory Visit (INDEPENDENT_AMBULATORY_CARE_PROVIDER_SITE_OTHER): Payer: Medicare Other

## 2023-01-09 DIAGNOSIS — I440 Atrioventricular block, first degree: Secondary | ICD-10-CM

## 2023-01-09 LAB — CUP PACEART REMOTE DEVICE CHECK
Battery Remaining Longevity: 63 mo
Battery Voltage: 2.94 V
Brady Statistic AP VP Percent: 68.06 %
Brady Statistic AP VS Percent: 0.05 %
Brady Statistic AS VP Percent: 31.83 %
Brady Statistic AS VS Percent: 0.05 %
Brady Statistic RA Percent Paced: 67.07 %
Brady Statistic RV Percent Paced: 99.31 %
Date Time Interrogation Session: 20240905093425
HighPow Impedance: 33 Ohm
HighPow Impedance: 44 Ohm
Implantable Lead Connection Status: 753985
Implantable Lead Connection Status: 753985
Implantable Lead Connection Status: 753985
Implantable Lead Implant Date: 20100520
Implantable Lead Implant Date: 20100520
Implantable Lead Implant Date: 20230602
Implantable Lead Location: 753858
Implantable Lead Location: 753859
Implantable Lead Location: 753860
Implantable Lead Model: 4598
Implantable Lead Model: 5076
Implantable Lead Model: 6947
Implantable Pulse Generator Implant Date: 20230602
Lead Channel Impedance Value: 152 Ohm
Lead Channel Impedance Value: 160.941
Lead Channel Impedance Value: 160.941
Lead Channel Impedance Value: 160.941
Lead Channel Impedance Value: 171 Ohm
Lead Channel Impedance Value: 247 Ohm
Lead Channel Impedance Value: 285 Ohm
Lead Channel Impedance Value: 304 Ohm
Lead Channel Impedance Value: 304 Ohm
Lead Channel Impedance Value: 342 Ohm
Lead Channel Impedance Value: 342 Ohm
Lead Channel Impedance Value: 399 Ohm
Lead Channel Impedance Value: 399 Ohm
Lead Channel Impedance Value: 532 Ohm
Lead Channel Impedance Value: 532 Ohm
Lead Channel Impedance Value: 551 Ohm
Lead Channel Impedance Value: 551 Ohm
Lead Channel Impedance Value: 551 Ohm
Lead Channel Pacing Threshold Amplitude: 0.625 V
Lead Channel Pacing Threshold Amplitude: 1.25 V
Lead Channel Pacing Threshold Pulse Width: 0.4 ms
Lead Channel Pacing Threshold Pulse Width: 0.4 ms
Lead Channel Sensing Intrinsic Amplitude: 1.25 mV
Lead Channel Sensing Intrinsic Amplitude: 1.25 mV
Lead Channel Sensing Intrinsic Amplitude: 4.25 mV
Lead Channel Sensing Intrinsic Amplitude: 4.25 mV
Lead Channel Setting Pacing Amplitude: 1.5 V
Lead Channel Setting Pacing Amplitude: 2 V
Lead Channel Setting Pacing Amplitude: 2.25 V
Lead Channel Setting Pacing Pulse Width: 0.4 ms
Lead Channel Setting Pacing Pulse Width: 0.8 ms
Lead Channel Setting Sensing Sensitivity: 0.3 mV

## 2023-01-16 NOTE — Progress Notes (Signed)
Remote ICD transmission.   

## 2023-01-29 ENCOUNTER — Encounter (HOSPITAL_COMMUNITY): Payer: Self-pay | Admitting: Cardiology

## 2023-02-01 NOTE — Progress Notes (Unsigned)
Electrophysiology Office Note:   Date:  02/03/2023  ID:  Nathan Pruitt, DOB [birth year], MRN [id number]  Primary Cardiologist: Marca Ancona, MD Electrophysiologist: Regan Lemming, MD      History of Present Illness:   Nathan Pruitt is a 77 y.o. male with h/o coronary disease, hypertension, hyperlipidemia, PAD, rheumatoid arthritis, ischemic cardiomyopathy, atrial flutter post ablation in 2010 seen today for routine electrophysiology followup.  He developed VT in 2018.  Catheterization showed an occluded ramus branch and CTO of the RCA with collaterals.  He had DES x 3 to the ramus.  Since last being seen in our clinic the patient reports issues with back pain and fatigue.  He is wearing a back brace currently.  He has no chest pain, but does note fatigue.  He is not short of breath.  He has trouble exerting himself due to his level of fatigue.  He says that he lives a sedentary lifestyle.  he denies chest pain, palpitations, dyspnea, PND, orthopnea, nausea, vomiting, dizziness, syncope, edema, weight gain, or early satiety.   Review of systems complete and found to be negative unless listed in HPI.      EP Information / Studies Reviewed:    EKG is ordered today. Personal review as below.  EKG Interpretation Date/Time:  Monday February 03 2023 08:19:26 EDT Ventricular Rate:  62 PR Interval:    QRS Duration:  164 QT Interval:  500 QTC Calculation: 507 R Axis:   247  Text Interpretation: ATRIAL SENSING Ventricular-paced rhythm Biventricular pacemaker detected Abnormal ECG When compared with ECG of 11-Jun-2022 09:07, No significant change since last tracing Confirmed by Casie Sturgeon ([id number]) on 02/03/2023 8:41:07 AM   ICD Interrogation-  reviewed in detail today,  See PACEART report.  Device History: Medtronic BiV ICD implanted 10/06/22 for chronic systolic heart failure, VT History of appropriate therapy: No History of AAD therapy: Yes; currently on amiodarone    Risk  Assessment/Calculations:              Physical Exam:   VS:  BP 122/82   Pulse 62   Ht 5\' 10"  (1.778 m)   Wt 151 lb 12.8 oz (68.9 kg)   SpO2 96%   BMI 21.78 kg/m    Wt Readings from Last 3 Encounters:  02/03/23 151 lb 12.8 oz (68.9 kg)  12/19/22 147 lb (66.7 kg)  07/25/22 149 lb 9.6 oz (67.9 kg)     GEN: Well nourished, well developed in no acute distress NECK: No JVD; No carotid bruits CARDIAC: Regular rate and rhythm, no murmurs, rubs, gallops RESPIRATORY:  Clear to auscultation without rales, wheezing or rhonchi  ABDOMEN: Soft, non-tender, non-distended EXTREMITIES:  No edema; No deformity   ASSESSMENT AND PLAN:    Chronic systolic dysfunction s/p Medtronic CRT-D  euvolemic today Stable on an appropriate medical regimen Normal ICD function See Pace Art report ICD histograms are quite flat.  Rate response turned on today.  2.  Ventricular tachycardia: Currently on amiodarone.  Continued episodes of VT though short.  No changes.  3.  Coronary artery disease: Significant disease with RCA occlusion and collaterals.  Plan per primary cardiology.  4.  Hypertension: Currently well-controlled  5.  Peripheral arterial disease: Followed by vascular surgery  6.  Atrial flutter: Post ablation in 2010  Disposition:   Follow up with Dr. Elberta Fortis in 12 months   Signed, Lashaun Krapf Jorja Loa, MD

## 2023-02-03 ENCOUNTER — Encounter: Payer: Self-pay | Admitting: Cardiology

## 2023-02-03 ENCOUNTER — Ambulatory Visit: Payer: Medicare Other | Attending: Cardiology | Admitting: Cardiology

## 2023-02-03 VITALS — BP 122/82 | HR 62 | Ht 70.0 in | Wt 151.8 lb

## 2023-02-03 DIAGNOSIS — I472 Ventricular tachycardia, unspecified: Secondary | ICD-10-CM | POA: Diagnosis not present

## 2023-02-03 DIAGNOSIS — I255 Ischemic cardiomyopathy: Secondary | ICD-10-CM

## 2023-02-03 DIAGNOSIS — I2510 Atherosclerotic heart disease of native coronary artery without angina pectoris: Secondary | ICD-10-CM | POA: Diagnosis not present

## 2023-02-03 DIAGNOSIS — I5022 Chronic systolic (congestive) heart failure: Secondary | ICD-10-CM

## 2023-02-03 DIAGNOSIS — I10 Essential (primary) hypertension: Secondary | ICD-10-CM

## 2023-02-03 LAB — CUP PACEART INCLINIC DEVICE CHECK
Battery Remaining Longevity: 65 mo
Battery Voltage: 2.99 V
Brady Statistic AP VP Percent: 62.48 %
Brady Statistic AP VS Percent: 0.11 %
Brady Statistic AS VP Percent: 37.31 %
Brady Statistic AS VS Percent: 0.11 %
Brady Statistic RA Percent Paced: 61.65 %
Brady Statistic RV Percent Paced: 99.09 %
Date Time Interrogation Session: 20240930084922
HighPow Impedance: 34 Ohm
HighPow Impedance: 47 Ohm
Implantable Lead Connection Status: 753985
Implantable Lead Connection Status: 753985
Implantable Lead Connection Status: 753985
Implantable Lead Implant Date: 20100520
Implantable Lead Implant Date: 20100520
Implantable Lead Implant Date: 20230602
Implantable Lead Location: 753858
Implantable Lead Location: 753859
Implantable Lead Location: 753860
Implantable Lead Model: 4598
Implantable Lead Model: 5076
Implantable Lead Model: 6947
Implantable Pulse Generator Implant Date: 20230602
Lead Channel Impedance Value: 152 Ohm
Lead Channel Impedance Value: 160.941
Lead Channel Impedance Value: 160.941
Lead Channel Impedance Value: 160.941
Lead Channel Impedance Value: 171 Ohm
Lead Channel Impedance Value: 285 Ohm
Lead Channel Impedance Value: 304 Ohm
Lead Channel Impedance Value: 304 Ohm
Lead Channel Impedance Value: 342 Ohm
Lead Channel Impedance Value: 342 Ohm
Lead Channel Impedance Value: 342 Ohm
Lead Channel Impedance Value: 418 Ohm
Lead Channel Impedance Value: 456 Ohm
Lead Channel Impedance Value: 532 Ohm
Lead Channel Impedance Value: 532 Ohm
Lead Channel Impedance Value: 551 Ohm
Lead Channel Impedance Value: 551 Ohm
Lead Channel Impedance Value: 589 Ohm
Lead Channel Pacing Threshold Amplitude: 0.5 V
Lead Channel Pacing Threshold Amplitude: 0.625 V
Lead Channel Pacing Threshold Amplitude: 1.5 V
Lead Channel Pacing Threshold Pulse Width: 0.4 ms
Lead Channel Pacing Threshold Pulse Width: 0.4 ms
Lead Channel Pacing Threshold Pulse Width: 0.4 ms
Lead Channel Sensing Intrinsic Amplitude: 0.25 mV
Lead Channel Sensing Intrinsic Amplitude: 1.875 mV
Lead Channel Sensing Intrinsic Amplitude: 4.25 mV
Lead Channel Sensing Intrinsic Amplitude: 6.125 mV
Lead Channel Setting Pacing Amplitude: 1.5 V
Lead Channel Setting Pacing Amplitude: 2 V
Lead Channel Setting Pacing Amplitude: 2.25 V
Lead Channel Setting Pacing Pulse Width: 0.4 ms
Lead Channel Setting Pacing Pulse Width: 0.8 ms
Lead Channel Setting Sensing Sensitivity: 0.3 mV

## 2023-02-03 NOTE — Patient Instructions (Signed)
Medication Instructions:  Your physician recommends that you continue on your current medications as directed. Please refer to the Current Medication list given to you today.  *If you need a refill on your cardiac medications before your next appointment, please call your pharmacy*   Lab Work: None ordered   Testing/Procedures: None ordered   Follow-Up: At [facility], you and your health needs are our priority.  As part of our continuing mission to provide you with exceptional heart care, we have created designated Provider Care Teams.  These Care Teams include your primary Cardiologist (physician) and Advanced Practice Providers (APPs -  Physician Assistants and Nurse Practitioners) who all work together to provide you with the care you need, when you need it.  Remote monitoring is used to monitor your Pacemaker or ICD from home. This monitoring reduces the number of office visits required to check your device to one time per year. It allows [place] to keep an eye on the functioning of your device to ensure it is working properly. You are scheduled for a device check from home on 04/10/23. You may send your transmission at any time that day. If you have a wireless device, the transmission will be sent automatically. After your physician reviews your transmission, you will receive a postcard with your next transmission date.  Your next appointment:   1 year(s)  The format for your next appointment:   In Person  Provider:   Loman Brooklyn, MD    Thank you for choosing [facility]!!   Dory Horn, RN [phone]

## 2023-02-25 ENCOUNTER — Other Ambulatory Visit (HOSPITAL_COMMUNITY): Payer: Self-pay | Admitting: *Deleted

## 2023-02-25 ENCOUNTER — Encounter (HOSPITAL_COMMUNITY): Payer: Self-pay | Admitting: Cardiology

## 2023-02-25 ENCOUNTER — Other Ambulatory Visit (HOSPITAL_COMMUNITY): Payer: Self-pay

## 2023-02-25 ENCOUNTER — Ambulatory Visit (HOSPITAL_COMMUNITY): Payer: Medicare Other

## 2023-02-25 ENCOUNTER — Ambulatory Visit (HOSPITAL_COMMUNITY)
Admission: RE | Admit: 2023-02-25 | Discharge: 2023-02-25 | Disposition: A | Discharge status: Home / Self Care | Payer: Medicare Other | Source: Ambulatory Visit | Attending: Cardiology | Admitting: Cardiology

## 2023-02-25 VITALS — BP 104/60 | HR 57 | Wt 142.0 lb

## 2023-02-25 DIAGNOSIS — I714 Abdominal aortic aneurysm, without rupture, unspecified: Secondary | ICD-10-CM | POA: Insufficient documentation

## 2023-02-25 DIAGNOSIS — N183 Chronic kidney disease, stage 3 unspecified: Secondary | ICD-10-CM | POA: Diagnosis not present

## 2023-02-25 DIAGNOSIS — I255 Ischemic cardiomyopathy: Secondary | ICD-10-CM | POA: Insufficient documentation

## 2023-02-25 DIAGNOSIS — Z7902 Long term (current) use of antithrombotics/antiplatelets: Secondary | ICD-10-CM | POA: Insufficient documentation

## 2023-02-25 DIAGNOSIS — I5022 Chronic systolic (congestive) heart failure: Secondary | ICD-10-CM

## 2023-02-25 DIAGNOSIS — Z95810 Presence of automatic (implantable) cardiac defibrillator: Secondary | ICD-10-CM | POA: Insufficient documentation

## 2023-02-25 DIAGNOSIS — I472 Ventricular tachycardia, unspecified: Secondary | ICD-10-CM | POA: Diagnosis not present

## 2023-02-25 DIAGNOSIS — I130 Hypertensive heart and chronic kidney disease with heart failure and stage 1 through stage 4 chronic kidney disease, or unspecified chronic kidney disease: Secondary | ICD-10-CM | POA: Insufficient documentation

## 2023-02-25 DIAGNOSIS — J84112 Idiopathic pulmonary fibrosis: Secondary | ICD-10-CM | POA: Insufficient documentation

## 2023-02-25 DIAGNOSIS — I2510 Atherosclerotic heart disease of native coronary artery without angina pectoris: Secondary | ICD-10-CM | POA: Insufficient documentation

## 2023-02-25 DIAGNOSIS — E785 Hyperlipidemia, unspecified: Secondary | ICD-10-CM | POA: Insufficient documentation

## 2023-02-25 DIAGNOSIS — I739 Peripheral vascular disease, unspecified: Secondary | ICD-10-CM | POA: Insufficient documentation

## 2023-02-25 DIAGNOSIS — R9431 Abnormal electrocardiogram [ECG] [EKG]: Secondary | ICD-10-CM | POA: Diagnosis not present

## 2023-02-25 LAB — CBC
HCT: 34.3 % — ABNORMAL LOW (ref 39.0–52.0)
Hemoglobin: 10.8 g/dL — ABNORMAL LOW (ref 13.0–17.0)
MCH: 29.9 pg (ref 26.0–34.0)
MCHC: 31.5 g/dL (ref 30.0–36.0)
MCV: 95 fL (ref 80.0–100.0)
Platelets: 183 10*3/uL (ref 150–400)
RBC: 3.61 MIL/uL — ABNORMAL LOW (ref 4.22–5.81)
RDW: 14 % (ref 11.5–15.5)
WBC: 5.8 10*3/uL (ref 4.0–10.5)
nRBC: 0 % (ref 0.0–0.2)

## 2023-02-25 LAB — COMPREHENSIVE METABOLIC PANEL
ALT: 11 U/L (ref 0–44)
AST: 15 U/L (ref 15–41)
Albumin: 3.4 g/dL — ABNORMAL LOW (ref 3.5–5.0)
Alkaline Phosphatase: 66 U/L (ref 38–126)
Anion gap: 12 (ref 5–15)
BUN: 37 mg/dL — ABNORMAL HIGH (ref 8–23)
CO2: 22 mmol/L (ref 22–32)
Calcium: 8.7 mg/dL — ABNORMAL LOW (ref 8.9–10.3)
Chloride: 100 mmol/L (ref 98–111)
Creatinine, Ser: 2.37 mg/dL — ABNORMAL HIGH (ref 0.61–1.24)
GFR, Estimated: 27 mL/min — ABNORMAL LOW (ref >60)
Glucose, Bld: 85 mg/dL (ref 70–99)
Potassium: 4.2 mmol/L (ref 3.5–5.1)
Sodium: 134 mmol/L — ABNORMAL LOW (ref 135–145)
Total Bilirubin: 0.7 mg/dL (ref 0.3–1.2)
Total Protein: 6.8 g/dL (ref 6.5–8.1)

## 2023-02-25 LAB — TSH: TSH: 3.52 u[IU]/mL (ref 0.350–4.500)

## 2023-02-25 LAB — BRAIN NATRIURETIC PEPTIDE: B Natriuretic Peptide: 1104.4 pg/mL — ABNORMAL HIGH (ref 0.0–100.0)

## 2023-02-25 NOTE — Patient Instructions (Signed)
There has been no changes to your medications     Labs done today, your results will be available in MyChart, we will contact you for abnormal readings.  You have been referred to Dr. Myra Gianotti for a Barostim evaluation. His office will call you to arrange your appointment.  You are scheduled for a Cardiac Catheterization on Tuesday, November 12 with Dr. Marca Ancona.  1. Please arrive at the [facility] (Main Entrance A) at [facility]: [address] [place], [place] [address] at 7:00 AM (This time is 2 hour(s) before your procedure to ensure your preparation). Free valet parking service is available. You will check in at ADMITTING. The support person will be asked to wait in the waiting room.  It is OK to have someone drop you off and come back when you are ready to be discharged.    Special note: Every effort is made to have your procedure done on time. Please understand that emergencies sometimes delay scheduled procedures.  2. Diet: Do not eat solid foods after midnight.  The patient may have clear liquids until 5am upon the day of the procedure.  3. Medication instructions in preparation for your procedure:   Contrast Allergy: No  HOLD YOUR FARXIGA, LASIX AND INSPRA THE MORNING OF YOUR PROCEDURE.  On the morning of your procedure, take any morning medicines NOT listed above.  You may use sips of water.  5. Plan to go home the same day, you will only stay overnight if medically necessary. 6. Bring a current list of your medications and current insurance cards. 7. You MUST have a responsible person to drive you home. 8. Someone MUST be with you the first 24 hours after you arrive home or your discharge will be delayed. 9. Please wear clothes that are easy to get on and off and wear slip-on shoes.  Your physician recommends that you schedule a follow-up appointment in: 2 MONTHS.  If you have any questions or concerns before your next appointment please send [place] a message  through [place] or call our office at [phone].    TO LEAVE A MESSAGE FOR THE NURSE SELECT OPTION 2, PLEASE LEAVE A MESSAGE INCLUDING: YOUR NAME DATE OF BIRTH CALL BACK NUMBER REASON FOR CALL**this is important as we prioritize the call backs  YOU WILL RECEIVE A CALL BACK THE SAME DAY AS LONG AS YOU CALL BEFORE 4:00 PM  At the [facility], you and your health needs are our priority. As part of our continuing mission to provide you with exceptional heart care, we have created designated Provider Care Teams. These Care Teams include your primary Cardiologist (physician) and Advanced Practice Providers (APPs- Physician Assistants and Nurse Practitioners) who all work together to provide you with the care you need, when you need it.   You may see any of the following providers on your designated Care Team at your next follow up: Dr Arvilla Meres Dr Marca Ancona Dr. Dorthula Nettles Dr. Clearnce Hasten Amy Filbert Schilder, NP Robbie Lis, [place] [facility] [place], [place] Brynda Peon, NP [place] Lee, NP Karle Plumber, PharmD   Please be sure to bring in all your medications bottles to every appointment.    Thank you for choosing [facility]

## 2023-02-26 ENCOUNTER — Encounter (HOSPITAL_COMMUNITY): Payer: Self-pay | Admitting: Cardiology

## 2023-02-26 NOTE — H&P (View-Only) (Signed)
ID:  Nathan Pruitt, DOB July 08, 1945, MRN [id number]   Provider location: [facility] Type of Visit: Established patient   PCP:  Marylen Ponto, MD  Cardiologist:  Marca Ancona, MD  History of Present Illness: Nathan Pruitt is a 77 y.o. male with a history of history of CAD, PAD, ischemic CMP, and VT s/p ICD.    Echo in 2/17 showed EF 40% and regional WMAs.   In 3/18, he developed episodes of VT as well as increased dyspnea.  LHC/RHC was done, showing new occlusion of a large ramus (CTO, probably a month or so old at time of cath) and old CTO RCA with collaterals.  He had DES x 3 to ramus.  Filling pressures were optimized on RHC.  Amiodarone was increased to 200 mg daily from 100 mg daily.  No VT since PCI.    Echo in 6/19 showed EF 35-40% with wall motion abnormalities. Echo in 1/21 showed EF 40-45% with basal to mid inferolateral and inferior akinesis, mild MR, normal RV, PASP 35 mmHg.   LHC/RHC in 9/21 with normal filling pressures and cardiac output but 95% OM1 stenosis.  This was treated with DES to OM1.   He has been following with Dr. Isaiah Serge with idiopathic pulmonary fibrosis.  I was concerned that he could have amiodarone -related lung toxicity but Dr. Isaiah Serge did not think this was very likely.  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     Labs drawn in 1/22 showed K up to 5.9 and creatinine 4.  He was sent to the ER at [facility]  He was found to have COVID-19 and was admitted. He had a productive cough and sore throat.  He was thought to be dehydrated and got IV fluid.  Creatinine 2.3 at discharge.  Lasix, valsartan, and eplerenone were stopped.  Other meds were continued.  Echo in 6/22 showed EF 30-35%, basal-mid inferior and inferolateral akinesis, anterolateral hypokinesis, moderate MR (appears infarct-related), mildly decreased RV function.   In 6/23, he had upgrade of device to CRT-D given frequent RV pacing.   Echo in 2/24 showed EF 25-30% with anterolateral/inferolateral akinesis, basal-mid inferior akinesis, mild RV dysfunction, mild-moderate MR.   CPX was done today, submaximal study but probably severe HF limitation as well as deconditioning.   He returns today for followup of CHF.  Stamina is worsening, he feels tired "all the time."  Breathing ok walking around the house but he gets short of breath walking up his driveway (at an incline).  He is able to do some yardwork but fatigues easily.  No orthopnea/PND.  Having balance trouble but not falls.  He has been steadily losing weight, down another 11 lbs.  No chest pain.    ECG (personally reviewed): a-BiV pacing  Medtronic device interrogation: 97% BiV pacing, no AF/VT, fluid index slightly above threshold.    Labs (11/10): K 4.9, creatinine 1.4, LDL 70, HDL 31, LFTs normal Labs (4/09): K 4.4, creatinine 1.3, LDL 61, HDL 35 Labs (7/11): LDL 62, HDL 23 Labs (1/12): K 4.8, LFTs normal, creatinine 1.44, LDL 69, HDL 33 Labs (7/12): LFTs normal, LDL 81, HDL 36, HCT normal, K 4.3, creatinine 1.3, BNP 116, TSH normal Labs (1/13): LDL 39, HDl 31, LFTs normal, TSH  Labs (7/13): TSH normal, LFTs normal, LDL 69, HDL 40 Labs (8/13): K 4.5, creatinine 1.6 Labs (10/13): K 4.4, creatinine 1.4 Labs (3/14): LDL 108, HDL 30, LFTs  normal Labs (9/14): K 4.2, creatinine 1.6, LFTs normal, LDL 114, TSH 10.6 (elevated), free T4/free T3 normal Labs (2/15): K 4.6, creatinine 1.5, LFTs normal, TSH 7.6 (elevated), free T4 and free T3 normal Labs (3/15): K 3.6, creatinine 1.5, LFTs normal, TSH normal Labs (9/15): K 4.3, creatinine 1.6, LFTs normal, TSH normal, LDL 115, HDL 24 Labs (10/16): K 4.5, creatinine 1.5, LDL 90, HDL 35, hgb 12.8 Labs (11/16): Low free T3, normal TSH and free T4 Labs (12/16): K 5, creatinine 1.8, LFTs normal, TSH 8.6 (mildly elevated) Labs (1/17): Free T4/TSH/free T3 normal, LFTs normal Labs (2/17): K 4.4, creatinine 2.4, LDL 131, HDL 34 Labs  (4/17): K 4.2, creatinine 1.3, LDL 67, HDL 35 Labs (9/17): K 4.9, creatinine 2.2, LDL 54, HDL 35, free T3 and T4 normal, hgb 12 Labs (4/18): K 5, creatinine 1.78, LFTs normal, TSH elevated but free T3 and free T4 normal.  Labs (6/18): LDL 52, HDL 35, TSH elevated 6.8, LFTs normal Labs (7/18): K 4.3, creatinine 1.68 Labs (10/18): TSH 5.6, free T3 normal, free T4 normal, LFTs normal, K 4.9, creatinine 1.4 Labs (11/18): K 4.5, creatinine 1.46, LFTs normal Labs (4/19): K 4.8, creatinine 1.6, LFTs normal Labs (6/19): TSH slightly elevated, free T3/T4 both normal, LFTs normal Labs (7/19): K 4.3, creatinine 1.3 Labs (10/19): LFTs normal, LDL 58, TGs 165, TSH 6.6 but free T4 and free T3 normal Labs (1/20): K 4.7, creatinine 1.7 Labs (2/20): LFTs normal Labs (4/20): K 4.6, creatinine 1.4, LDL 77, HDL 36, hgb 12.2, TSH mild elevation 7.4, free T4 and free T3 normal.  Labs (7/20): K 3.8, creatinine 1.6, hgb 12.2, LDL 81, TGs 158, HDL 35, TSH 7.7 (mildly elevated), LFTs normal Labs (11/20): K 4.5, creatinine 1.7, NT-proBNP 1358, TSH 5.6 (mild elevation), LDL 88, LFTs normal Labs (1/61): K 4.3, creatinine 1.6, hgb 12.4, LFTs, normal, TGs 105, LDL 81, HDL 40, TSH 5.6 (mildly elevated), free T3 and free T4 normal Labs (6/21): K 4.7, creatinine 1.9, AST/ALT normal, TSH mildly elevated at 7.9, free T3 and T4 normal, LDL 87, TGs 118 Labs (10/21): K 4.4, creatinine 1.8, LFTs normal, LDL 89, pro-BNP 1830, LFTs normal Labs (11/21): ESR 48, K 4.6, creatinine 1.96 Labs (12/21): ANA negative Labs (1/22): K 5.9, creatinine 4 => 2.3 Labs (2/22): K 4.6, creatinine 2.1 => 2.2, LDL 77, LFTs normal Labs (0/96): K 4.7,creatinine 2.7, TSH mildly elevated but free T3 and free T4 normal, hgb 10.2, LDL 67, LFTs normal Labs (0/45): creatinine 2.0 Labs (6/22): K 4.7, creatinine 2.4 Labs (9/22): K 4.4, creatinine 2.6, LFTs normal, hgb 9.9, LDL 62, TSH 5.94, free T4 normal, free T3 low.  Labs (10/22): TSH 4.99 Labs (12/22):  LDL 57 Labs (3/23): TSH normal, free T3 and free T4 normal, K 4.7, creatinine 2.3, LFTs normal, LDL 61 Labs (6/23): hgb 10.1, K 5.1, creatinine 2.1 Labs (7/23): LDL 72, LFTs normal Labs (4/09): hgb 11.2, K 4.7, creatinine 2.2, LFTs normal, transferrin saturation 21% Labs (1/24): K 4.5, creatinine 2.3, LFTs normal, TSH slightly elevated Labs (8/24): K 4.8, creatinine 2.3, LFTs normal, LDL 70   Allergies (verified):  No Known Drug Allergies   Past Medical History: 1. Coronary artery disease. The patient reports history of silent MI in 1993.  This was likely an inferior MI (see below).  - The patient presented to [facility] in [facility]/10 with VT and mildly elevated cardiac enzymes. LHC (5/10):  Inferobasal dyskinesis with EF 35-40%.  There was chronic total occlusion of the mid  RCA with good collaterals.  Luminals LCA.  This did not appear to be an acute cause of the 5/10 event.  - LHC (3/18): Known occlusion of RCA with collaterals, new occlusion of ramus.  DES x 3 to ramus.  - LHC (9/21): Known occlusion RCA with collaterals, 95% OMI treated with DES.  2. Hypertension.  3. Hyperlipidemia: Intolerant of higher doses of simvastatin, Crestor, Lipitor, pravastatin. Zetia caused constipation.  4. Remote tobacco abuse with 47-pack-year history, quitting in August 2009.  5. Peripheral arterial disease.  - Status post left-to-right fem-fem bypass performed in [place] in March 2009.  - Status post redo left-to-right fem-fem bypass performed by Dr. Edilia Bo at [facility] in 2009.  - ABIs normal 3/15, ABIs normal 3/16, ABIs normal 3/17, ABIs normal 4/18, ABIs normal 4/19, ABIs normal 12/20. ABIs stable 3/24.  6. Rheumatoid arthritis, on leflunomide.  7.  Ischemic cardiomyopathy:  EF 35-40% by LV-gram 5/10 with inferobasal dyskinesis.  Echo 5/10 showed EF 40% with mild LVH, no significant MR, inferobasal and posterobasal akinesis.  Echo (7/11): EF 50%, mild LVH, basal-mid inferoposterior akinesis.  Echo (7/12):  EF 45-50% with basal anterolateral, basal posterior, and basal to mid inferior akinesis.  Echo (4/16): EF 40-45%, basal to mid inferolateral AK, basal inferior AK, basal to mid anterolateral HK.  Echo (2/17): EF 40% with basal to mid inferior akinesis, basal inferolateral aneurysm, mid inferolateral akinesis, basal anterolateral hypokinesis, normal RV size and systolic function, PASP 25 mmHg.  - Echo (3/18) with EF 30-35%, moderate LV dilation, moderate MR, mildly decreased RV systolic function, PASP 51 mmmHg. - RHC (3/18): mean RA 4, PA 38/12, mean PCWP 17, CI 2.2.  - Echo (6/19): EF 35-40%, inferior/inferolateral/anterolateral WMAs, moderate MR likely infarct-related, normal RV size with mildly decreased systolic function.  - Echo (1/21): EF 35-40% with wall motion abnormalities. Echo was done today and reviewed, EF 40-45% with basal to mid inferolateral and inferior akinesis, mild MR, normal RV, PASP 35 mmHg.  - RHC (9/21): mean RA 4, PA 33/12, mean PCWP 9, CI 3.12 - Echo (6/22): EF 30-35%, basal-mid inferior and inferolateral akinesis, anterolateral hypokinesis, moderate MR (appears infarct-related), mildly decreased RV function.  - CRT-D upgrade (Medtronic) in 6/23.  - Echo (2/24): EF 25-30% with anterolateral/inferolateral akinesis, basal-mid inferior akinesis, mild RV dysfunction, mild-moderate MR. - CPX (10/24): submaximal with RER 1.04, peak VO2 11.6, VE/VCO2 slope 54; severe HF limitation, also with deconditioning.  8.  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  He has a Medtronic CRT-D device.  9.  Atrial flutter:  Status post isthmus ablation 5/10.   10. Restrictive lung defect: PFTs (7/11): FVC 74%, FEV1 80%, ratio 75%, TLC 78%, DLCO 68%.  This suggests a mild restrictive defect and a mild obstructive defect.  He did have response to bronchodilator. These PFTs were significantly better than the report from Dr. Blenda Nicely in [place] done  prior.  He had last PFTs in 2/12 with no significant change.  - PFTs (9/21) with significantly decreased DLCO and concern for ILD/pulmonary fibrosis.  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  11.  Anxiety 12.  Chronic cough: No relief with change from ACEI to ARB or with trial of PPI.  13.  CKD: Stage 3.  14. Mitral regurgitation: Moderate on 6/22 echo, suspect infarct-related.  15. AAA: 3.1 cm AAA on abdominal US 12/20  - Abdominal US 5/22 with 3.1 cm AAA - Abdominal US 2/24  with 3.4 cm AAA 16. COVID-19 infection: 1/22.   Current Outpatient Medications  Medication Sig Dispense Refill   acetaminophen (TYLENOL) 500 MG tablet Take 500-1,000 mg by mouth every 6 (six) hours as needed (for pain.).     amiodarone (PACERONE) 200 MG tablet Take 1/2 (one-half) tablet by mouth once daily 45 tablet 3   carvedilol (COREG) 12.5 MG tablet Take 1 tablet by mouth twice daily 180 tablet 0   chlorhexidine (PERIDEX) 0.12 % solution Use as directed 15 mLs in the mouth or throat daily as needed (irritation).   3   clopidogrel (PLAVIX) 75 MG tablet Take 1 tablet by mouth once daily with breakfast 90 tablet 0   dapagliflozin propanediol (FARXIGA) 10 MG TABS tablet Take 1 tablet (10 mg total) by mouth daily before breakfast.     eplerenone (INSPRA) 25 MG tablet Take 1 tablet by mouth once daily 90 tablet 0   fluticasone (FLONASE) 50 MCG/ACT nasal spray Place 1-2 sprays into both nostrils daily as needed for allergies or rhinitis.     furosemide (LASIX) 40 MG tablet Take 1 tablet by mouth once daily 90 tablet 0   leflunomide (ARAVA) 20 MG tablet Take 20 mg by mouth daily.     levothyroxine (SYNTHROID) 25 MCG tablet TAKE 1 TABLET BY MOUTH ONCE DAILY BEFORE BREAKFAST 90 tablet 3   Melatonin 10 MG TABS Take 10 mg by mouth at bedtime as needed (sleep).      simvastatin (ZOCOR) 20 MG tablet TAKE 1 TABLET BY MOUTH EVERYDAY AT BEDTIME 90 tablet 3   No current facility-administered medications  for this encounter.    Allergies:   Atorvastatin, Crestor [rosuvastatin calcium], Enalapril, Lisinopril, Losartan, and Telmisartan   Social History:  The patient  reports that he quit smoking about 15 years ago. His smoking use included cigarettes. He started smoking about 62 years ago. He has a 47 pack-year smoking history. He has never been exposed to tobacco smoke. He has never used smokeless tobacco. He reports current alcohol use. He reports that he does not use drugs.   Family History:  The patient's family history includes Gastric cancer in his mother; Heart attack (age of onset: 48) in his brother; Peripheral vascular disease in his father; Throat cancer in his father.   ROS:  Please see the history of present illness.   All other systems are personally reviewed and negative.   Exam:   BP 104/60   Pulse (!) 57   Wt 64.4 kg (142 lb)   SpO2 97%   BMI 20.37 kg/m  General: NAD, frail.  Neck: No JVD, no thyromegaly or thyroid nodule.  Lungs: Clear to auscultation bilaterally with normal respiratory effort. CV: Lateral PMI.  Heart regular S1/S2, no S3/S4, no murmur.  No peripheral edema.  No carotid bruit.  Normal pedal pulses.  Abdomen: Soft, nontender, no hepatosplenomegaly, no distention.  Skin: Intact without lesions or rashes.  Neurologic: Alert and oriented x 3.  Psych: Normal affect. Extremities: No clubbing or cyanosis.  HEENT: Normal.   Recent Labs: 02/25/2023: ALT 11; B Natriuretic Peptide 1,104.4; BUN 37; Creatinine, Ser 2.37; Hemoglobin 10.8; Platelets 183; Potassium 4.2; Sodium 134; TSH 3.520  Personally reviewed   Wt Readings from Last 3 Encounters:  02/25/23 64.4 kg (142 lb)  02/03/23 68.9 kg (151 lb 12.8 oz)  12/19/22 66.7 kg (147 lb)     ASSESSMENT AND PLAN:  1. Chronic systolic CHF: Ischemic cardiomyopathy.  Medtronic CRT-D device.  Echo in 6/22 with  EF 30-35%, mild RV dysfunction. Upgraded to CRT device given high percentage of RV pacing.  Echo 2/24  showed EF 25-30% with anterolateral/inferolateral akinesis, basal-mid inferior akinesis, mild RV dysfunction, mild-moderate MR.  CPX today was submaximal but suggestive of severe HF limitation.  Chronic NYHA class III symptoms, slowly worsening with prominent fatigue.  Optivol fluid index mildly above threshold but not volume overloaded on exam.  I worry that he has low output heart failure.  CKD limits medication titration.  - Continue Coreg 12.5 mg bid.       - Continue Lasix 40 mg daily, BMET/BNP today.  - Continue eplerenone 25 mg daily. Will not increase with high normal K and elevated creatinine.  - He is off valsartan for now with elevated creatinine, may be able to start back on low dose in the future.    - Continue Farxiga 10 mg daily.  - As above, concern for low output HF.  He would be a difficult LVAD candidate with CKD and his lung disease (pulmonary fibrosis).  I spoke with him today about LVAD placement, he says that he would not be interested in open heart surgery and also does not have anyone who could stay with him as a caregiver post-op.  I am going to arrange for RHC to assess filling pressures and to get a formal assessment of cardiac output. We discussed risks/benefits and he agrees to procedure.  - Though he does not want an LVAD, he would be open to baroreceptor activation therapy.  I will refer him to Dr. Myra Gianotti for consideration of this.  2. CAD: Denies chest pain.  Had DES x 3 to ramus in 3/18 and DES to OM1 in 9/21.   - Continue Zocor, lipids ok in 8/24.  - Continue Plavix long-term, off ASA.    3. Hyperlipidemia: Stable on simvastatin without myalgias.  Has not been able to tolerate higher dose of simvastatin or other statins. Unable to tolerate Zetia due to constipation.  Lipids acceptable in 8/24.  4. Hx of VT: Has been on amiodarone 100 mg daily. Has ICD.  He did not tolerate sotalol.  - He is on Levoxyl for amiodarone-induced mild hypothyroidism.   - Knows to get a  yearly eye exam - Check LFTs today.  - See below regarding ?amiodarone lung toxicity.  5. PAD: He denies claudication.  Stable ABIs 2/24 at [facility]. 6. AAA: 3.4 cm in 2/24, followed by [facility].  7. CKD: Stage 3.  Last creatinine 2.3.  - Follows with nephrology now.   - BMET today.  8. Pulmonary: PFTs were done with increased dyspnea and amiodorone use, concerning for ILD/pulmonary fibrosis.  High resolution CT chest was concerning for ILD.  I have worried about amiodarone-induced lung toxicity. 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  He was offered antifibrotic treatment but wants to hold off for now. I think that some of his symptomatology is due to IPF.  - Can continue amiodarone 100 mg daily as above for VT suppression.  - Pulmonary followup with Dr. Isaiah Serge.    Followup 2 months.   Signed, Marca Ancona, MD  02/26/2023   [facility] [address] [facility] [place] [address] [phone] (office) [phone] (fax)

## 2023-02-26 NOTE — Progress Notes (Signed)
ID:  Nathan Pruitt, DOB July 08, 1945, MRN [id number]   Provider location: [facility] Type of Visit: Established patient   PCP:  Marylen Ponto, MD  Cardiologist:  Marca Ancona, MD  History of Present Illness: Nathan Pruitt is a 77 y.o. male with a history of history of CAD, PAD, ischemic CMP, and VT s/p ICD.    Echo in 2/17 showed EF 40% and regional WMAs.   In 3/18, he developed episodes of VT as well as increased dyspnea.  LHC/RHC was done, showing new occlusion of a large ramus (CTO, probably a month or so old at time of cath) and old CTO RCA with collaterals.  He had DES x 3 to ramus.  Filling pressures were optimized on RHC.  Amiodarone was increased to 200 mg daily from 100 mg daily.  No VT since PCI.    Echo in 6/19 showed EF 35-40% with wall motion abnormalities. Echo in 1/21 showed EF 40-45% with basal to mid inferolateral and inferior akinesis, mild MR, normal RV, PASP 35 mmHg.   LHC/RHC in 9/21 with normal filling pressures and cardiac output but 95% OM1 stenosis.  This was treated with DES to OM1.   He has been following with Dr. Isaiah Serge with idiopathic pulmonary fibrosis.  I was concerned that he could have amiodarone -related lung toxicity but Dr. Isaiah Serge did not think this was very likely.  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     Labs drawn in 1/22 showed K up to 5.9 and creatinine 4.  He was sent to the ER at [facility]  He was found to have COVID-19 and was admitted. He had a productive cough and sore throat.  He was thought to be dehydrated and got IV fluid.  Creatinine 2.3 at discharge.  Lasix, valsartan, and eplerenone were stopped.  Other meds were continued.  Echo in 6/22 showed EF 30-35%, basal-mid inferior and inferolateral akinesis, anterolateral hypokinesis, moderate MR (appears infarct-related), mildly decreased RV function.   In 6/23, he had upgrade of device to CRT-D given frequent RV pacing.   Echo in 2/24 showed EF 25-30% with anterolateral/inferolateral akinesis, basal-mid inferior akinesis, mild RV dysfunction, mild-moderate MR.   CPX was done today, submaximal study but probably severe HF limitation as well as deconditioning.   He returns today for followup of CHF.  Stamina is worsening, he feels tired "all the time."  Breathing ok walking around the house but he gets short of breath walking up his driveway (at an incline).  He is able to do some yardwork but fatigues easily.  No orthopnea/PND.  Having balance trouble but not falls.  He has been steadily losing weight, down another 11 lbs.  No chest pain.    ECG (personally reviewed): a-BiV pacing  Medtronic device interrogation: 97% BiV pacing, no AF/VT, fluid index slightly above threshold.    Labs (11/10): K 4.9, creatinine 1.4, LDL 70, HDL 31, LFTs normal Labs (4/09): K 4.4, creatinine 1.3, LDL 61, HDL 35 Labs (7/11): LDL 62, HDL 23 Labs (1/12): K 4.8, LFTs normal, creatinine 1.44, LDL 69, HDL 33 Labs (7/12): LFTs normal, LDL 81, HDL 36, HCT normal, K 4.3, creatinine 1.3, BNP 116, TSH normal Labs (1/13): LDL 39, HDl 31, LFTs normal, TSH  Labs (7/13): TSH normal, LFTs normal, LDL 69, HDL 40 Labs (8/13): K 4.5, creatinine 1.6 Labs (10/13): K 4.4, creatinine 1.4 Labs (3/14): LDL 108, HDL 30, LFTs  normal Labs (9/14): K 4.2, creatinine 1.6, LFTs normal, LDL 114, TSH 10.6 (elevated), free T4/free T3 normal Labs (2/15): K 4.6, creatinine 1.5, LFTs normal, TSH 7.6 (elevated), free T4 and free T3 normal Labs (3/15): K 3.6, creatinine 1.5, LFTs normal, TSH normal Labs (9/15): K 4.3, creatinine 1.6, LFTs normal, TSH normal, LDL 115, HDL 24 Labs (10/16): K 4.5, creatinine 1.5, LDL 90, HDL 35, hgb 12.8 Labs (11/16): Low free T3, normal TSH and free T4 Labs (12/16): K 5, creatinine 1.8, LFTs normal, TSH 8.6 (mildly elevated) Labs (1/17): Free T4/TSH/free T3 normal, LFTs normal Labs (2/17): K 4.4, creatinine 2.4, LDL 131, HDL 34 Labs  (4/17): K 4.2, creatinine 1.3, LDL 67, HDL 35 Labs (9/17): K 4.9, creatinine 2.2, LDL 54, HDL 35, free T3 and T4 normal, hgb 12 Labs (4/18): K 5, creatinine 1.78, LFTs normal, TSH elevated but free T3 and free T4 normal.  Labs (6/18): LDL 52, HDL 35, TSH elevated 6.8, LFTs normal Labs (7/18): K 4.3, creatinine 1.68 Labs (10/18): TSH 5.6, free T3 normal, free T4 normal, LFTs normal, K 4.9, creatinine 1.4 Labs (11/18): K 4.5, creatinine 1.46, LFTs normal Labs (4/19): K 4.8, creatinine 1.6, LFTs normal Labs (6/19): TSH slightly elevated, free T3/T4 both normal, LFTs normal Labs (7/19): K 4.3, creatinine 1.3 Labs (10/19): LFTs normal, LDL 58, TGs 165, TSH 6.6 but free T4 and free T3 normal Labs (1/20): K 4.7, creatinine 1.7 Labs (2/20): LFTs normal Labs (4/20): K 4.6, creatinine 1.4, LDL 77, HDL 36, hgb 12.2, TSH mild elevation 7.4, free T4 and free T3 normal.  Labs (7/20): K 3.8, creatinine 1.6, hgb 12.2, LDL 81, TGs 158, HDL 35, TSH 7.7 (mildly elevated), LFTs normal Labs (11/20): K 4.5, creatinine 1.7, NT-proBNP 1358, TSH 5.6 (mild elevation), LDL 88, LFTs normal Labs (1/61): K 4.3, creatinine 1.6, hgb 12.4, LFTs, normal, TGs 105, LDL 81, HDL 40, TSH 5.6 (mildly elevated), free T3 and free T4 normal Labs (6/21): K 4.7, creatinine 1.9, AST/ALT normal, TSH mildly elevated at 7.9, free T3 and T4 normal, LDL 87, TGs 118 Labs (10/21): K 4.4, creatinine 1.8, LFTs normal, LDL 89, pro-BNP 1830, LFTs normal Labs (11/21): ESR 48, K 4.6, creatinine 1.96 Labs (12/21): ANA negative Labs (1/22): K 5.9, creatinine 4 => 2.3 Labs (2/22): K 4.6, creatinine 2.1 => 2.2, LDL 77, LFTs normal Labs (0/96): K 4.7,creatinine 2.7, TSH mildly elevated but free T3 and free T4 normal, hgb 10.2, LDL 67, LFTs normal Labs (0/45): creatinine 2.0 Labs (6/22): K 4.7, creatinine 2.4 Labs (9/22): K 4.4, creatinine 2.6, LFTs normal, hgb 9.9, LDL 62, TSH 5.94, free T4 normal, free T3 low.  Labs (10/22): TSH 4.99 Labs (12/22):  LDL 57 Labs (3/23): TSH normal, free T3 and free T4 normal, K 4.7, creatinine 2.3, LFTs normal, LDL 61 Labs (6/23): hgb 10.1, K 5.1, creatinine 2.1 Labs (7/23): LDL 72, LFTs normal Labs (4/09): hgb 11.2, K 4.7, creatinine 2.2, LFTs normal, transferrin saturation 21% Labs (1/24): K 4.5, creatinine 2.3, LFTs normal, TSH slightly elevated Labs (8/24): K 4.8, creatinine 2.3, LFTs normal, LDL 70   Allergies (verified):  No Known Drug Allergies   Past Medical History: 1. Coronary artery disease. The patient reports history of silent MI in 1993.  This was likely an inferior MI (see below).  - The patient presented to [facility] in 5/10 with VT and mildly elevated cardiac enzymes. LHC (5/10):  Inferobasal dyskinesis with EF 35-40%.  There was chronic total occlusion of the mid  RCA with good collaterals.  Luminals LCA.  This did not appear to be an acute cause of the 5/10 event.  - LHC (3/18): Known occlusion of RCA with collaterals, new occlusion of ramus.  DES x 3 to ramus.  - LHC (9/21): Known occlusion RCA with collaterals, 95% OMI treated with DES.  2. Hypertension.  3. Hyperlipidemia: Intolerant of higher doses of simvastatin, Crestor, Lipitor, pravastatin. Zetia caused constipation.  4. Remote tobacco abuse with 47-pack-year history, quitting in August 2009.  5. Peripheral arterial disease.  - Status post left-to-right fem-fem bypass performed in [place] in March 2009.  - Status post redo left-to-right fem-fem bypass performed by Dr. Edilia Bo at [facility] in 2009.  - ABIs normal 3/15, ABIs normal 3/16, ABIs normal 3/17, ABIs normal 4/18, ABIs normal 4/19, ABIs normal 12/20. ABIs stable 3/24.  6. Rheumatoid arthritis, on leflunomide.  7.  Ischemic cardiomyopathy:  EF 35-40% by LV-gram 5/10 with inferobasal dyskinesis.  Echo 5/10 showed EF 40% with mild LVH, no significant MR, inferobasal and posterobasal akinesis.  Echo (7/11): EF 50%, mild LVH, basal-mid inferoposterior akinesis.  Echo (7/12):  EF 45-50% with basal anterolateral, basal posterior, and basal to mid inferior akinesis.  Echo (4/16): EF 40-45%, basal to mid inferolateral AK, basal inferior AK, basal to mid anterolateral HK.  Echo (2/17): EF 40% with basal to mid inferior akinesis, basal inferolateral aneurysm, mid inferolateral akinesis, basal anterolateral hypokinesis, normal RV size and systolic function, PASP 25 mmHg.  - Echo (3/18) with EF 30-35%, moderate LV dilation, moderate MR, mildly decreased RV systolic function, PASP 51 mmmHg. - RHC (3/18): mean RA 4, PA 38/12, mean PCWP 17, CI 2.2.  - Echo (6/19): EF 35-40%, inferior/inferolateral/anterolateral WMAs, moderate MR likely infarct-related, normal RV size with mildly decreased systolic function.  - Echo (1/21): EF 35-40% with wall motion abnormalities. Echo was done today and reviewed, EF 40-45% with basal to mid inferolateral and inferior akinesis, mild MR, normal RV, PASP 35 mmHg.  - RHC (9/21): mean RA 4, PA 33/12, mean PCWP 9, CI 3.12 - Echo (6/22): EF 30-35%, basal-mid inferior and inferolateral akinesis, anterolateral hypokinesis, moderate MR (appears infarct-related), mildly decreased RV function.  - CRT-D upgrade (Medtronic) in 6/23.  - Echo (2/24): EF 25-30% with anterolateral/inferolateral akinesis, basal-mid inferior akinesis, mild RV dysfunction, mild-moderate MR. - CPX (10/24): submaximal with RER 1.04, peak VO2 11.6, VE/VCO2 slope 54; severe HF limitation, also with deconditioning.  8.  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  He has a Medtronic CRT-D device.  9.  Atrial flutter:  Status post isthmus ablation 5/10.   10. Restrictive lung defect: PFTs (7/11): FVC 74%, FEV1 80%, ratio 75%, TLC 78%, DLCO 68%.  This suggests a mild restrictive defect and a mild obstructive defect.  He did have response to bronchodilator. These PFTs were significantly better than the report from Dr. Blenda Nicely in [place] done  prior.  He had last PFTs in 2/12 with no significant change.  - PFTs (9/21) with significantly decreased DLCO and concern for ILD/pulmonary fibrosis.  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  11.  Anxiety 12.  Chronic cough: No relief with change from ACEI to ARB or with trial of PPI.  13.  CKD: Stage 3.  14. Mitral regurgitation: Moderate on 6/22 echo, suspect infarct-related.  15. AAA: 3.1 cm AAA on abdominal US 12/20  - Abdominal US 5/22 with 3.1 cm AAA - Abdominal US 2/24  with 3.4 cm AAA 16. COVID-19 infection: 1/22.   Current Outpatient Medications  Medication Sig Dispense Refill   acetaminophen (TYLENOL) 500 MG tablet Take 500-1,000 mg by mouth every 6 (six) hours as needed (for pain.).     amiodarone (PACERONE) 200 MG tablet Take 1/2 (one-half) tablet by mouth once daily 45 tablet 3   carvedilol (COREG) 12.5 MG tablet Take 1 tablet by mouth twice daily 180 tablet 0   chlorhexidine (PERIDEX) 0.12 % solution Use as directed 15 mLs in the mouth or throat daily as needed (irritation).   3   clopidogrel (PLAVIX) 75 MG tablet Take 1 tablet by mouth once daily with breakfast 90 tablet 0   dapagliflozin propanediol (FARXIGA) 10 MG TABS tablet Take 1 tablet (10 mg total) by mouth daily before breakfast.     eplerenone (INSPRA) 25 MG tablet Take 1 tablet by mouth once daily 90 tablet 0   fluticasone (FLONASE) 50 MCG/ACT nasal spray Place 1-2 sprays into both nostrils daily as needed for allergies or rhinitis.     furosemide (LASIX) 40 MG tablet Take 1 tablet by mouth once daily 90 tablet 0   leflunomide (ARAVA) 20 MG tablet Take 20 mg by mouth daily.     levothyroxine (SYNTHROID) 25 MCG tablet TAKE 1 TABLET BY MOUTH ONCE DAILY BEFORE BREAKFAST 90 tablet 3   Melatonin 10 MG TABS Take 10 mg by mouth at bedtime as needed (sleep).      simvastatin (ZOCOR) 20 MG tablet TAKE 1 TABLET BY MOUTH EVERYDAY AT BEDTIME 90 tablet 3   No current facility-administered medications  for this encounter.    Allergies:   Atorvastatin, Crestor [rosuvastatin calcium], Enalapril, Lisinopril, Losartan, and Telmisartan   Social History:  The patient  reports that he quit smoking about 15 years ago. His smoking use included cigarettes. He started smoking about 62 years ago. He has a 47 pack-year smoking history. He has never been exposed to tobacco smoke. He has never used smokeless tobacco. He reports current alcohol use. He reports that he does not use drugs.   Family History:  The patient's family history includes Gastric cancer in his mother; Heart attack (age of onset: 48) in his brother; Peripheral vascular disease in his father; Throat cancer in his father.   ROS:  Please see the history of present illness.   All other systems are personally reviewed and negative.   Exam:   BP 104/60   Pulse (!) 57   Wt 64.4 kg (142 lb)   SpO2 97%   BMI 20.37 kg/m  General: NAD, frail.  Neck: No JVD, no thyromegaly or thyroid nodule.  Lungs: Clear to auscultation bilaterally with normal respiratory effort. CV: Lateral PMI.  Heart regular S1/S2, no S3/S4, no murmur.  No peripheral edema.  No carotid bruit.  Normal pedal pulses.  Abdomen: Soft, nontender, no hepatosplenomegaly, no distention.  Skin: Intact without lesions or rashes.  Neurologic: Alert and oriented x 3.  Psych: Normal affect. Extremities: No clubbing or cyanosis.  HEENT: Normal.   Recent Labs: 02/25/2023: ALT 11; B Natriuretic Peptide 1,104.4; BUN 37; Creatinine, Ser 2.37; Hemoglobin 10.8; Platelets 183; Potassium 4.2; Sodium 134; TSH 3.520  Personally reviewed   Wt Readings from Last 3 Encounters:  02/25/23 64.4 kg (142 lb)  02/03/23 68.9 kg (151 lb 12.8 oz)  12/19/22 66.7 kg (147 lb)     ASSESSMENT AND PLAN:  1. Chronic systolic CHF: Ischemic cardiomyopathy.  Medtronic CRT-D device.  Echo in 6/22 with  EF 30-35%, mild RV dysfunction. Upgraded to CRT device given high percentage of RV pacing.  Echo 2/24  showed EF 25-30% with anterolateral/inferolateral akinesis, basal-mid inferior akinesis, mild RV dysfunction, mild-moderate MR.  CPX today was submaximal but suggestive of severe HF limitation.  Chronic NYHA class III symptoms, slowly worsening with prominent fatigue.  Optivol fluid index mildly above threshold but not volume overloaded on exam.  I worry that he has low output heart failure.  CKD limits medication titration.  - Continue Coreg 12.5 mg bid.       - Continue Lasix 40 mg daily, BMET/BNP today.  - Continue eplerenone 25 mg daily. Will not increase with high normal K and elevated creatinine.  - He is off valsartan for now with elevated creatinine, may be able to start back on low dose in the future.    - Continue Farxiga 10 mg daily.  - As above, concern for low output HF.  He would be a difficult LVAD candidate with CKD and his lung disease (pulmonary fibrosis).  I spoke with him today about LVAD placement, he says that he would not be interested in open heart surgery and also does not have anyone who could stay with him as a caregiver post-op.  I am going to arrange for RHC to assess filling pressures and to get a formal assessment of cardiac output. We discussed risks/benefits and he agrees to procedure.  - Though he does not want an LVAD, he would be open to baroreceptor activation therapy.  I will refer him to Dr. Myra Gianotti for consideration of this.  2. CAD: Denies chest pain.  Had DES x 3 to ramus in 3/18 and DES to OM1 in 9/21.   - Continue Zocor, lipids ok in 8/24.  - Continue Plavix long-term, off ASA.    3. Hyperlipidemia: Stable on simvastatin without myalgias.  Has not been able to tolerate higher dose of simvastatin or other statins. Unable to tolerate Zetia due to constipation.  Lipids acceptable in 8/24.  4. Hx of VT: Has been on amiodarone 100 mg daily. Has ICD.  He did not tolerate sotalol.  - He is on Levoxyl for amiodarone-induced mild hypothyroidism.   - Knows to get a  yearly eye exam - Check LFTs today.  - See below regarding ?amiodarone lung toxicity.  5. PAD: He denies claudication.  Stable ABIs 2/24 at [facility]. 6. AAA: 3.4 cm in 2/24, followed by [facility].  7. CKD: Stage 3.  Last creatinine 2.3.  - Follows with nephrology now.   - BMET today.  8. Pulmonary: PFTs were done with increased dyspnea and amiodorone use, concerning for ILD/pulmonary fibrosis.  High resolution CT chest was concerning for ILD.  I have worried about amiodarone-induced lung toxicity. 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  He was offered antifibrotic treatment but wants to hold off for now. I think that some of his symptomatology is due to IPF.  - Can continue amiodarone 100 mg daily as above for VT suppression.  - Pulmonary followup with Dr. Isaiah Serge.    Followup 2 months.   Signed, Marca Ancona, MD  02/26/2023   [facility] [address] [facility] [place] [address] [phone] (office) [phone] (fax)

## 2023-02-28 ENCOUNTER — Encounter (HOSPITAL_COMMUNITY): Payer: Self-pay | Admitting: Cardiology

## 2023-02-28 ENCOUNTER — Other Ambulatory Visit: Payer: Self-pay | Admitting: *Deleted

## 2023-02-28 DIAGNOSIS — I5022 Chronic systolic (congestive) heart failure: Secondary | ICD-10-CM

## 2023-02-28 DIAGNOSIS — Z01818 Encounter for other preprocedural examination: Secondary | ICD-10-CM

## 2023-03-03 ENCOUNTER — Ambulatory Visit (HOSPITAL_COMMUNITY)
Admission: RE | Admit: 2023-03-03 | Discharge: 2023-03-03 | Disposition: A | Discharge status: Home / Self Care | Payer: Medicare Other | Source: Ambulatory Visit | Attending: Surgery | Admitting: Surgery

## 2023-03-03 ENCOUNTER — Encounter: Payer: Self-pay | Admitting: Surgery

## 2023-03-03 ENCOUNTER — Ambulatory Visit: Payer: Medicare Other | Admitting: Surgery

## 2023-03-03 VITALS — BP 127/78 | HR 67 | Temp 97.9°F | Resp 20 | Ht 70.0 in | Wt 143.6 lb

## 2023-03-03 DIAGNOSIS — Z01818 Encounter for other preprocedural examination: Secondary | ICD-10-CM | POA: Diagnosis not present

## 2023-03-03 DIAGNOSIS — I5022 Chronic systolic (congestive) heart failure: Secondary | ICD-10-CM | POA: Insufficient documentation

## 2023-03-03 NOTE — Progress Notes (Signed)
[facility] [place]  Patient name: Nathan Pruitt MRN: [id number] DOB: [birth year] Sex: male   REASON FOR VISIT:    Barostim evaluation  HISOTRY OF PRESENT ILLNESS:    Nathan Pruitt is a 77 y.o. male who is a patient of Dr. Edilia Bo, status post femoral-femoral bypass graft in 2009.  He was last seen in [place] clinic in February 2024.  His bypass graft was widely patent.  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  The patient is referred for evaluation for a Barostim implant.  He suffers from chronic systolic congestive heart failure.  His most recent echo was 30 to 35% he has NYHA class III symptoms that are getting worse.  The most significant is his fatigue.  He has not a good LVAD candidate because of his CKD and pulmonary fibrosis.  He does have a history of coronary artery disease, status post PCI and 2018 and 2021.  He is medically managed for hypercholesterolemia with a statin.  He has stage III chronic renal insufficiency.  He has a ICD in place. PAST MEDICAL HISTORY:   Past Medical History:  Diagnosis Date   Abnormal PFTs 11/2009   FVC 74%, FEV1 80%, ratio 75%, TLC 78%, DLCO 68%. this suggests a mild restrictive and obstructive defect. pt did have a response to bronchodilator. these PFTs were significantly better than the report from Dr. Blenda Nicely in [place] done prior.    AICD (automatic cardioverter/defibrillator) present 2010   Anxiety    Atrial flutter (HCC)    s/p isthmus ablation 5/10   CAD (coronary artery disease)    hx of silent MI in 1993. likely an inferior MI. hx of 2D cardiogram in 3/09 showing EF of 40%. hx of myoview in [place] 3/09-nml. presented to [facility] 5/10 with VT and mildly elevated cardiac enzymes LHC (5/10): inferobasal dyskinesis with EF 35-40%. was chronic total occlusion of mid RCA with good collaterals. luminals LCA. this does not appear to be an acute cause of the 5/10 event   Cancer (HCC)    skin   Cervical  radiculopathy    CHF (congestive heart failure) (HCC)    Chronic lower back pain    CKD (chronic kidney disease)    HLD (hyperlipidemia)    HTN (hypertension)    Ischemic cardiomyopathy    EF 35-40% by LV-gram 5/10 with inferobasal dyskinesis. echo 5/10 showed EF 40% w/mild LVH, no sig. MR, inferobasal and posterobasal akinesis. 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 He has duel chamber Medtronic ICD     FAMILY HISTORY:   Family History  Problem Relation Age of Onset   Gastric cancer Mother    Throat cancer Father    Peripheral vascular disease Father    Heart attack Brother 72    SOCIAL HISTORY:   Social History   Tobacco Use   Smoking status: Former    Current packs/day: 0.00    Average packs/day: 1 pack/day for 47.0 years (47.0 ttl pk-yrs)  Types: Cigarettes    Start date: 45    Quit date: 2009    Years since quitting: 15.8    Passive exposure: Never   Smokeless tobacco: Never  Substance Use Topics   Alcohol use: Yes    Comment: 07/15/2016 "nothing for the last couple years" 04/18/21 very seldom     ALLERGIES:   Allergies  Allergen Reactions   Atorvastatin Other (See Comments)    Muscle pain   Crestor [Rosuvastatin Calcium] Other (See Comments)    Muscle pain   Enalapril Other (See Comments)    Upset stomach   Lisinopril Cough   Losartan Other (See Comments)    Muscle pain    Telmisartan Other (See Comments)    Stomach ache     CURRENT MEDICATIONS:   Current Outpatient Medications   Medication Sig Dispense Refill   acetaminophen (TYLENOL) 500 MG tablet Take 500-1,000 mg by mouth every 6 (six) hours as needed (for pain.).     amiodarone (PACERONE) 200 MG tablet Take 1/2 (one-half) tablet by mouth once daily 45 tablet 3   carvedilol (COREG) 12.5 MG tablet Take 1 tablet by mouth twice daily 180 tablet 0   chlorhexidine (PERIDEX) 0.12 % solution Use as directed 15 mLs in the mouth or throat daily as needed (irritation).   3   clopidogrel (PLAVIX) 75 MG tablet Take 1 tablet by mouth once daily with breakfast 90 tablet 0   dapagliflozin propanediol (FARXIGA) 10 MG TABS tablet Take 1 tablet (10 mg total) by mouth daily before breakfast.     eplerenone (INSPRA) 25 MG tablet Take 1 tablet by mouth once daily 90 tablet 0   fluticasone (FLONASE) 50 MCG/ACT nasal spray Place 1-2 sprays into both nostrils daily as needed for allergies or rhinitis.     furosemide (LASIX) 40 MG tablet Take 1 tablet by mouth once daily 90 tablet 0   leflunomide (ARAVA) 20 MG tablet Take 20 mg by mouth daily.     levothyroxine (SYNTHROID) 25 MCG tablet TAKE 1 TABLET BY MOUTH ONCE DAILY BEFORE BREAKFAST 90 tablet 3   Melatonin 10 MG TABS Take 10 mg by mouth at bedtime as needed (sleep).      simvastatin (ZOCOR) 20 MG tablet TAKE 1 TABLET BY MOUTH EVERYDAY AT BEDTIME 90 tablet 3   No current facility-administered medications for this visit.    REVIEW OF SYSTEMS:   [X]  denotes positive finding, [ ]  denotes negative finding Cardiac  Comments:  Chest pain or chest pressure:    Shortness of breath upon exertion:    Short of breath when lying flat:    Irregular heart rhythm:        Vascular    Pain in calf, thigh, or hip brought on by ambulation:    Pain in feet at night that wakes you up from your sleep:     Blood clot in your veins:    Leg swelling:         Pulmonary    Oxygen at home:    Productive cough:     Wheezing:         Neurologic    Sudden weakness in arms or legs:     Sudden  numbness in arms or legs:     Sudden onset of difficulty speaking or slurred speech:    Temporary loss of vision in one eye:     Problems with dizziness:         Gastrointestinal    Blood in  stool:     Vomited blood:         Genitourinary    Burning when urinating:     Blood in urine:        Psychiatric    Major depression:         Hematologic    Bleeding problems:    Problems with blood clotting too easily:        Skin    Rashes or ulcers:        Constitutional    Fever or chills:      PHYSICAL EXAM:   Vitals:   03/03/23 0818  BP: 127/78  Pulse: 67  Resp: 20  Temp: 97.9 F (36.6 C)  SpO2: 96%  Weight: 143 lb 9.6 oz (65.1 kg)  Height: 5\' 10"  (1.778 m)    GENERAL: The patient is a well-nourished male, in no acute distress. The vital signs are documented above. CARDIAC: There is a regular rate and rhythm.  VASCULAR: SonoSite was used to evaluate his carotid bifurcation which is high on the right side at the level of mandible PULMONARY: Non-labored respirations MUSCULOSKELETAL: There are no major deformities or cyanosis. NEUROLOGIC: No focal weakness or paresthesias are detected. SKIN: There are no ulcers or rashes noted. PSYCHIATRIC: The patient has a normal affect.  STUDIES:   I have reviewed the following carotid duplex:  Right Carotid: Velocities in the right ICA are consistent with a 60-79%                 stenosis.   Left Carotid: Velocities in the left ICA are consistent with a 60-79%  stenosis.   Vertebrals: Bilateral vertebral arteries demonstrate antegrade flow.  Subclavians: Normal flow hemodynamics were seen in bilateral subclavian               arteries.   MEDICAL ISSUES:   NYHA class III heart failure: The patient has turndown an LVAD.  His biggest quality-of-life issues are with shortness of breath and fatigue.  He is sent over for evaluation of Barostim therapy.  We discussed dealing with placing the device.  We talked about the  expectations for device benefit including improvement in his fatigue and shortness of breath.  He is very interested in doing this.  He is scheduled to have a right heart catheterization in the next few weeks.  I told him that the 1 issue with the device implant would be that he has 60-79% bilateral carotid stenosis.  When I looked at the artery myself with ultrasound, this does not appear to be calcified disease.  I considered getting a CT angiogram to better evaluate this because his diastolic velocities on ultrasound suggested that this is at the very low range of the stenosis category, however with his underlying renal disease I did not think that that was advisable.  He is asymptomatic from his carotid perspective therefore I think it is reasonable to proceed.  I have spoken with the company about this and although the initial enrollment trials did not include patients with carotid stenosis, anecdotally this has been done around the country without any significant challenges.  Therefore, I think it is reasonable to proceed with device implant.  The patient understands all this and wishes to proceed.    Charlena Cross, MD, FACS [facility] [phone] Pager [phone]

## 2023-03-10 ENCOUNTER — Encounter (HOSPITAL_COMMUNITY): Payer: Self-pay | Admitting: Cardiology

## 2023-03-10 ENCOUNTER — Encounter: Payer: Self-pay | Admitting: Surgery

## 2023-03-18 ENCOUNTER — Ambulatory Visit (HOSPITAL_COMMUNITY)
Admission: RE | Admit: 2023-03-18 | Discharge: 2023-03-18 | Disposition: A | Discharge status: Home / Self Care | Payer: Medicare Other | Attending: Cardiology | Admitting: Cardiology

## 2023-03-18 ENCOUNTER — Encounter (HOSPITAL_COMMUNITY): Payer: Self-pay | Admitting: Cardiology

## 2023-03-18 ENCOUNTER — Encounter (HOSPITAL_COMMUNITY)
Admission: RE | Disposition: A | Discharge status: Home / Self Care | Payer: Self-pay | Source: Home / Self Care | Attending: Cardiology

## 2023-03-18 ENCOUNTER — Other Ambulatory Visit: Payer: Self-pay

## 2023-03-18 DIAGNOSIS — E785 Hyperlipidemia, unspecified: Secondary | ICD-10-CM | POA: Diagnosis not present

## 2023-03-18 DIAGNOSIS — I130 Hypertensive heart and chronic kidney disease with heart failure and stage 1 through stage 4 chronic kidney disease, or unspecified chronic kidney disease: Secondary | ICD-10-CM | POA: Diagnosis present

## 2023-03-18 DIAGNOSIS — Z955 Presence of coronary angioplasty implant and graft: Secondary | ICD-10-CM | POA: Insufficient documentation

## 2023-03-18 DIAGNOSIS — I739 Peripheral vascular disease, unspecified: Secondary | ICD-10-CM | POA: Diagnosis not present

## 2023-03-18 DIAGNOSIS — Z79899 Other long term (current) drug therapy: Secondary | ICD-10-CM | POA: Diagnosis not present

## 2023-03-18 DIAGNOSIS — N183 Chronic kidney disease, stage 3 unspecified: Secondary | ICD-10-CM | POA: Insufficient documentation

## 2023-03-18 DIAGNOSIS — E039 Hypothyroidism, unspecified: Secondary | ICD-10-CM | POA: Insufficient documentation

## 2023-03-18 DIAGNOSIS — I5022 Chronic systolic (congestive) heart failure: Secondary | ICD-10-CM | POA: Diagnosis not present

## 2023-03-18 DIAGNOSIS — Z79890 Hormone replacement therapy (postmenopausal): Secondary | ICD-10-CM | POA: Insufficient documentation

## 2023-03-18 DIAGNOSIS — I714 Abdominal aortic aneurysm, without rupture, unspecified: Secondary | ICD-10-CM | POA: Insufficient documentation

## 2023-03-18 DIAGNOSIS — Z7902 Long term (current) use of antithrombotics/antiplatelets: Secondary | ICD-10-CM | POA: Diagnosis not present

## 2023-03-18 DIAGNOSIS — I255 Ischemic cardiomyopathy: Secondary | ICD-10-CM | POA: Diagnosis not present

## 2023-03-18 DIAGNOSIS — I2510 Atherosclerotic heart disease of native coronary artery without angina pectoris: Secondary | ICD-10-CM | POA: Diagnosis not present

## 2023-03-18 DIAGNOSIS — Z95810 Presence of automatic (implantable) cardiac defibrillator: Secondary | ICD-10-CM | POA: Diagnosis not present

## 2023-03-18 DIAGNOSIS — I509 Heart failure, unspecified: Secondary | ICD-10-CM

## 2023-03-18 HISTORY — PX: RIGHT HEART CATH: CATH118263

## 2023-03-18 LAB — POCT I-STAT EG7
Acid-base deficit: 3 mmol/L — ABNORMAL HIGH (ref 0.0–2.0)
Acid-base deficit: 3 mmol/L — ABNORMAL HIGH (ref 0.0–2.0)
Bicarbonate: 21.5 mmol/L (ref 20.0–28.0)
Bicarbonate: 21.7 mmol/L (ref 20.0–28.0)
Calcium, Ion: 1.02 mmol/L — ABNORMAL LOW (ref 1.15–1.40)
Calcium, Ion: 1.03 mmol/L — ABNORMAL LOW (ref 1.15–1.40)
HCT: 29 % — ABNORMAL LOW (ref 39.0–52.0)
HCT: 29 % — ABNORMAL LOW (ref 39.0–52.0)
Hemoglobin: 9.9 g/dL — ABNORMAL LOW (ref 13.0–17.0)
Hemoglobin: 9.9 g/dL — ABNORMAL LOW (ref 13.0–17.0)
O2 Saturation: 55 %
O2 Saturation: 56 %
Potassium: 3.9 mmol/L (ref 3.5–5.1)
Potassium: 3.9 mmol/L (ref 3.5–5.1)
Sodium: 137 mmol/L (ref 135–145)
Sodium: 138 mmol/L (ref 135–145)
TCO2: 23 mmol/L (ref 22–32)
TCO2: 23 mmol/L (ref 22–32)
pCO2, Ven: 37.3 mm[Hg] — ABNORMAL LOW (ref 44–60)
pCO2, Ven: 38 mm[Hg] — ABNORMAL LOW (ref 44–60)
pH, Ven: 7.365 (ref 7.25–7.43)
pH, Ven: 7.368 (ref 7.25–7.43)
pO2, Ven: 30 mm[Hg] — CL (ref 32–45)
pO2, Ven: 30 mm[Hg] — CL (ref 32–45)

## 2023-03-18 SURGERY — RIGHT HEART CATH
Anesthesia: LOCAL

## 2023-03-18 MED ORDER — SODIUM CHLORIDE 0.9 % IV SOLN: INTRAVENOUS | Status: DC

## 2023-03-18 MED ORDER — LIDOCAINE HCL (PF) 1 % IJ SOLN
INTRAMUSCULAR | Status: DC | PRN
  Administered 2023-03-18: 1 mL

## 2023-03-18 MED ORDER — ONDANSETRON HCL 4 MG/2ML IJ SOLN: 4.0000 mg | Freq: Four times a day (QID) | INTRAMUSCULAR | Status: DC | PRN

## 2023-03-18 MED ORDER — SODIUM CHLORIDE 0.9% FLUSH: 3.0000 mL | INTRAVENOUS | Status: DC | PRN

## 2023-03-18 MED ORDER — HYDRALAZINE HCL 20 MG/ML IJ SOLN: 10.0000 mg | INTRAMUSCULAR | Status: DC | PRN

## 2023-03-18 MED ORDER — SODIUM CHLORIDE 0.9% FLUSH: 3.0000 mL | Freq: Two times a day (BID) | INTRAVENOUS | Status: DC

## 2023-03-18 MED ORDER — HEPARIN (PORCINE) IN NACL 1000-0.9 UT/500ML-% IV SOLN
INTRAVENOUS | Status: DC | PRN
  Administered 2023-03-18: 500 mL

## 2023-03-18 MED ORDER — LABETALOL HCL 5 MG/ML IV SOLN: 10.0000 mg | INTRAVENOUS | Status: DC | PRN

## 2023-03-18 MED ORDER — SODIUM CHLORIDE 0.9 % IV SOLN: 250.0000 mL | INTRAVENOUS | Status: DC | PRN

## 2023-03-18 MED ORDER — LIDOCAINE HCL (PF) 1 % IJ SOLN
INTRAMUSCULAR | Status: AC
  Filled 2023-03-18: qty 30

## 2023-03-18 MED ORDER — ACETAMINOPHEN 325 MG PO TABS: 650.0000 mg | ORAL_TABLET | ORAL | Status: DC | PRN

## 2023-03-18 SURGICAL SUPPLY — 6 items
CATH SWAN GANZ 7F STRAIGHT (CATHETERS) IMPLANT
GLIDESHEATH SLENDER 7FR .021G (SHEATH) IMPLANT
GUIDEWIRE .025 260CM (WIRE) IMPLANT
PACK CARDIAC CATHETERIZATION (CUSTOM PROCEDURE TRAY) ×1 IMPLANT
TRANSDUCER W/STOPCOCK (MISCELLANEOUS) IMPLANT
TUBING ART PRESS 72 MALE/FEM (TUBING) IMPLANT

## 2023-03-18 NOTE — Discharge Instructions (Signed)

## 2023-03-18 NOTE — Interval H&P Note (Signed)
History and Physical Interval Note:  03/18/2023 9:31 AM  Nathan Pruitt  has presented today for surgery, with the diagnosis of heart failure.  The various methods of treatment have been discussed with the patient and family. After consideration of risks, benefits and other options for treatment, the patient has consented to  Procedure(s): RIGHT HEART CATH (N/A) as a surgical intervention.  The patient's history has been reviewed, patient examined, no change in status, stable for surgery.  I have reviewed the patient's chart and labs.  Questions were answered to the patient's satisfaction.     Sricharan Lacomb [employer]

## 2023-03-19 ENCOUNTER — Telehealth: Payer: Self-pay

## 2023-03-19 NOTE — Telephone Encounter (Signed)
I tried calling patient to advise him to get a Pro BNP drawn for his Barostim implant.  I was unable to leave a voicemail.

## 2023-03-21 ENCOUNTER — Other Ambulatory Visit (HOSPITAL_COMMUNITY): Payer: Self-pay | Admitting: Cardiology

## 2023-03-25 ENCOUNTER — Other Ambulatory Visit (HOSPITAL_COMMUNITY): Payer: Self-pay | Admitting: Cardiology

## 2023-03-25 ENCOUNTER — Encounter: Payer: Self-pay | Admitting: Cardiology

## 2023-04-07 ENCOUNTER — Telehealth (HOSPITAL_COMMUNITY): Payer: Self-pay | Admitting: Pharmacy Technician

## 2023-04-07 NOTE — Telephone Encounter (Signed)
Advanced Heart Failure Patient Advocate Encounter    Patient was automatically approved to receive Farxiga from [employer] through 05/05/24.   Document scanned to chart. Patient will be notified via mail. Patient currently using the assistance through previous enrollment end date. Patients current RX still has refills. No further action needed at this time.   Archer Asa, CPhT

## 2023-04-10 ENCOUNTER — Ambulatory Visit (INDEPENDENT_AMBULATORY_CARE_PROVIDER_SITE_OTHER): Payer: Medicare Other

## 2023-04-10 DIAGNOSIS — I472 Ventricular tachycardia, unspecified: Secondary | ICD-10-CM

## 2023-04-10 LAB — CUP PACEART REMOTE DEVICE CHECK
Battery Remaining Longevity: 61 mo
Battery Voltage: 2.91 V
Brady Statistic AP VP Percent: 75.56 %
Brady Statistic AP VS Percent: 0.2 %
Brady Statistic AS VP Percent: 24.02 %
Brady Statistic AS VS Percent: 0.22 %
Brady Statistic RA Percent Paced: 73.46 %
Brady Statistic RV Percent Paced: 98.04 %
Date Time Interrogation Session: 20241205033325
HighPow Impedance: 34 Ohm
HighPow Impedance: 43 Ohm
Implantable Lead Connection Status: 753985
Implantable Lead Connection Status: 753985
Implantable Lead Connection Status: 753985
Implantable Lead Implant Date: 20100520
Implantable Lead Implant Date: 20100520
Implantable Lead Implant Date: 20230602
Implantable Lead Location: 753858
Implantable Lead Location: 753859
Implantable Lead Location: 753860
Implantable Lead Model: 4598
Implantable Lead Model: 5076
Implantable Lead Model: 6947
Implantable Pulse Generator Implant Date: 20230602
Lead Channel Impedance Value: 160.941
Lead Channel Impedance Value: 160.941
Lead Channel Impedance Value: 160.941
Lead Channel Impedance Value: 171 Ohm
Lead Channel Impedance Value: 171 Ohm
Lead Channel Impedance Value: 247 Ohm
Lead Channel Impedance Value: 304 Ohm
Lead Channel Impedance Value: 304 Ohm
Lead Channel Impedance Value: 342 Ohm
Lead Channel Impedance Value: 342 Ohm
Lead Channel Impedance Value: 342 Ohm
Lead Channel Impedance Value: 418 Ohm
Lead Channel Impedance Value: 418 Ohm
Lead Channel Impedance Value: 532 Ohm
Lead Channel Impedance Value: 551 Ohm
Lead Channel Impedance Value: 551 Ohm
Lead Channel Impedance Value: 551 Ohm
Lead Channel Impedance Value: 589 Ohm
Lead Channel Pacing Threshold Amplitude: 0.625 V
Lead Channel Pacing Threshold Amplitude: 1.125 V
Lead Channel Pacing Threshold Pulse Width: 0.4 ms
Lead Channel Pacing Threshold Pulse Width: 0.4 ms
Lead Channel Sensing Intrinsic Amplitude: 1.25 mV
Lead Channel Sensing Intrinsic Amplitude: 1.25 mV
Lead Channel Sensing Intrinsic Amplitude: 7.125 mV
Lead Channel Sensing Intrinsic Amplitude: 7.125 mV
Lead Channel Setting Pacing Amplitude: 1.5 V
Lead Channel Setting Pacing Amplitude: 2 V
Lead Channel Setting Pacing Amplitude: 2.25 V
Lead Channel Setting Pacing Pulse Width: 0.4 ms
Lead Channel Setting Pacing Pulse Width: 0.8 ms
Lead Channel Setting Sensing Sensitivity: 0.3 mV

## 2023-04-12 ENCOUNTER — Encounter: Payer: Self-pay | Admitting: Surgery

## 2023-04-18 ENCOUNTER — Encounter (HOSPITAL_COMMUNITY): Payer: Self-pay | Admitting: Cardiology

## 2023-04-24 ENCOUNTER — Other Ambulatory Visit (HOSPITAL_COMMUNITY): Payer: Self-pay | Admitting: Cardiology

## 2023-04-24 ENCOUNTER — Ambulatory Visit (HOSPITAL_COMMUNITY)
Admission: RE | Admit: 2023-04-24 | Discharge: 2023-04-24 | Disposition: A | Discharge status: Home / Self Care | Payer: Medicare Other | Source: Ambulatory Visit | Attending: Cardiology | Admitting: Cardiology

## 2023-04-24 VITALS — BP 130/78 | HR 110 | Ht 70.0 in | Wt 157.8 lb

## 2023-04-24 DIAGNOSIS — D649 Anemia, unspecified: Secondary | ICD-10-CM | POA: Insufficient documentation

## 2023-04-24 DIAGNOSIS — N183 Chronic kidney disease, stage 3 unspecified: Secondary | ICD-10-CM | POA: Diagnosis not present

## 2023-04-24 DIAGNOSIS — R0602 Shortness of breath: Secondary | ICD-10-CM | POA: Insufficient documentation

## 2023-04-24 DIAGNOSIS — E785 Hyperlipidemia, unspecified: Secondary | ICD-10-CM | POA: Diagnosis not present

## 2023-04-24 DIAGNOSIS — Z95810 Presence of automatic (implantable) cardiac defibrillator: Secondary | ICD-10-CM | POA: Diagnosis not present

## 2023-04-24 DIAGNOSIS — R0600 Dyspnea, unspecified: Secondary | ICD-10-CM | POA: Diagnosis not present

## 2023-04-24 DIAGNOSIS — Z7902 Long term (current) use of antithrombotics/antiplatelets: Secondary | ICD-10-CM | POA: Diagnosis not present

## 2023-04-24 DIAGNOSIS — I255 Ischemic cardiomyopathy: Secondary | ICD-10-CM | POA: Diagnosis not present

## 2023-04-24 DIAGNOSIS — M069 Rheumatoid arthritis, unspecified: Secondary | ICD-10-CM | POA: Insufficient documentation

## 2023-04-24 DIAGNOSIS — I5022 Chronic systolic (congestive) heart failure: Secondary | ICD-10-CM | POA: Diagnosis present

## 2023-04-24 DIAGNOSIS — Z955 Presence of coronary angioplasty implant and graft: Secondary | ICD-10-CM | POA: Insufficient documentation

## 2023-04-24 DIAGNOSIS — I252 Old myocardial infarction: Secondary | ICD-10-CM | POA: Diagnosis not present

## 2023-04-24 DIAGNOSIS — Z7984 Long term (current) use of oral hypoglycemic drugs: Secondary | ICD-10-CM | POA: Insufficient documentation

## 2023-04-24 DIAGNOSIS — Z79899 Other long term (current) drug therapy: Secondary | ICD-10-CM | POA: Diagnosis not present

## 2023-04-24 DIAGNOSIS — Z8616 Personal history of COVID-19: Secondary | ICD-10-CM | POA: Diagnosis not present

## 2023-04-24 DIAGNOSIS — I2510 Atherosclerotic heart disease of native coronary artery without angina pectoris: Secondary | ICD-10-CM | POA: Diagnosis not present

## 2023-04-24 DIAGNOSIS — E039 Hypothyroidism, unspecified: Secondary | ICD-10-CM | POA: Diagnosis not present

## 2023-04-24 DIAGNOSIS — Z79890 Hormone replacement therapy (postmenopausal): Secondary | ICD-10-CM | POA: Diagnosis not present

## 2023-04-24 DIAGNOSIS — Z8679 Personal history of other diseases of the circulatory system: Secondary | ICD-10-CM | POA: Diagnosis not present

## 2023-04-24 DIAGNOSIS — J8410 Pulmonary fibrosis, unspecified: Secondary | ICD-10-CM | POA: Insufficient documentation

## 2023-04-24 DIAGNOSIS — I130 Hypertensive heart and chronic kidney disease with heart failure and stage 1 through stage 4 chronic kidney disease, or unspecified chronic kidney disease: Secondary | ICD-10-CM | POA: Diagnosis not present

## 2023-04-24 DIAGNOSIS — I739 Peripheral vascular disease, unspecified: Secondary | ICD-10-CM | POA: Insufficient documentation

## 2023-04-24 LAB — FERRITIN: Ferritin: 74 ng/mL (ref 24–336)

## 2023-04-24 LAB — IRON AND TIBC
Iron: 56 ug/dL (ref 45–182)
Saturation Ratios: 16 % — ABNORMAL LOW (ref 17.9–39.5)
TIBC: 356 ug/dL (ref 250–450)
UIBC: 300 ug/dL

## 2023-04-24 MED ORDER — FUROSEMIDE 40 MG PO TABS: ORAL_TABLET | ORAL | 4 refills | Status: DC

## 2023-04-24 NOTE — Patient Instructions (Signed)
INCREASE Lasix to 40 mg twice a day for 2 days, then decrease to 40 mg in the AM and 20 mg in the PM  Labs today We will only contact you if something comes back abnormal or we need to make some changes. Otherwise no news is good news!  Your physician recommends that you schedule a follow-up appointment in: 6 weeks  in the Advanced Practitioners (PA/NP) Clinic     Do the following things EVERYDAY: Weigh yourself in the morning before breakfast. Write it down and keep it in a log. Take your medicines as prescribed Eat low salt foods--Limit salt (sodium) to 2000 mg per day.  Stay as active as you can everyday Limit all fluids for the day to less than 2 liters  At the [facility] Clinic, you and your health needs are our priority. As part of our continuing mission to provide you with exceptional heart care, we have created designated Provider Care Teams. These Care Teams include your primary Cardiologist (physician) and Advanced Practice Providers (APPs- Physician Assistants and Nurse Practitioners) who all work together to provide you with the care you need, when you need it.   You may see any of the following providers on your designated Care Team at your next follow up: Dr Arvilla Meres Dr Marca Ancona Dr. Dorthula Nettles Dr. Clearnce Hasten Amy Filbert Schilder, NP Robbie Lis, [place] [facility] [place], [place] Brynda Peon, NP [place] Lee, NP Karle Plumber, PharmD   Please be sure to bring in all your medications bottles to every appointment.    Thank you for choosing [facility]   If you have any questions or concerns before your next appointment please send [place] a message through [place] or call our office at [phone].    TO LEAVE A MESSAGE FOR THE NURSE SELECT OPTION 2, PLEASE LEAVE A MESSAGE INCLUDING: YOUR NAME DATE OF BIRTH CALL BACK NUMBER REASON FOR CALL**this is important as we prioritize the call  backs  YOU WILL RECEIVE A CALL BACK THE SAME DAY AS LONG AS YOU CALL BEFORE 4:00 PM

## 2023-04-24 NOTE — Progress Notes (Signed)
ID:  Nathan Pruitt, DOB April 14, 1946, MRN [id number]   Provider location: [facility] Type of Visit: Established patient   PCP:  Marylen Ponto, MD  Cardiologist:  Marca Ancona, MD  History of Present Illness: Nathan Pruitt is a 77 y.o. male with a history of history of CAD, PAD, ischemic CMP, and VT s/p ICD.    Echo in 2/17 showed EF 40% and regional WMAs.   In 3/18, he developed episodes of VT as well as increased dyspnea.  LHC/RHC was done, showing new occlusion of a large ramus (CTO, probably a month or so old at time of cath) and old CTO RCA with collaterals.  He had DES x 3 to ramus.  Filling pressures were optimized on RHC.  Amiodarone was increased to 200 mg daily from 100 mg daily.  No VT since PCI.    Echo in 6/19 showed EF 35-40% with wall motion abnormalities. Echo in 1/21 showed EF 40-45% with basal to mid inferolateral and inferior akinesis, mild MR, normal RV, PASP 35 mmHg.   LHC/RHC in 9/21 with normal filling pressures and cardiac output but 95% OM1 stenosis.  This was treated with DES to OM1.   He has been following with Dr. Isaiah Serge with idiopathic pulmonary fibrosis.  I was concerned that he could have amiodarone -related lung toxicity but Dr. Isaiah Serge did not think this was very likely.  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     Labs drawn in 1/22 showed K up to 5.9 and creatinine 4.  He was sent to the ER at [facility].  He was found to have COVID-19 and was admitted. He had a productive cough and sore throat.  He was thought to be dehydrated and got IV fluid.  Creatinine 2.3 at discharge.  Lasix, valsartan, and eplerenone were stopped.  Other meds were continued.  Echo in 6/22 showed EF 30-35%, basal-mid inferior and inferolateral akinesis, anterolateral hypokinesis, moderate MR (appears infarct-related), mildly decreased RV function.   In 6/23, he had upgrade of device to CRT-D given frequent RV pacing.   Echo in 2/24 showed EF 25-30% with anterolateral/inferolateral akinesis, basal-mid inferior akinesis, mild RV dysfunction, mild-moderate MR.   CPX in 10/24 was a submaximal study but probably severe HF limitation as well as deconditioning.   RHC in 11/24 showed normal filling pressures and CI 2.11 by Fick, 2.35 by thermodilution.   He returns today for followup of CHF.  Still fatigues easily and feels weak/tired most of the time. He has been having orthopneic episodes recently.  Weight is up 15 lbs but he also says he has been trying to gain weight by eating more protein. He is short of breath with fast walking, inclines.  No chest pain.  He saw Dr. Myra Gianotti in 10/24 but has not heard back about barostimulator device.   ECG (personally reviewed): A-BiV pacing  Medtronic device interrogation: >95% BiV pacing, no AF/VT, fluid index > threshold    Labs (11/10): K 4.9, creatinine 1.4, LDL 70, HDL 31, LFTs normal Labs (0/86): K 4.4, creatinine 1.3, LDL 61, HDL 35 Labs (7/11): LDL 62, HDL 23 Labs (1/12): K 4.8, LFTs normal, creatinine 1.44, LDL 69, HDL 33 Labs (7/12): LFTs normal, LDL 81, HDL 36, HCT normal, K 4.3, creatinine 1.3, BNP 116, TSH normal Labs (1/13): LDL 39, HDl 31, LFTs normal, TSH  Labs (7/13): TSH normal, LFTs normal, LDL 69, HDL 40 Labs (8/13):  K 4.5, creatinine 1.6 Labs (10/13): K 4.4, creatinine 1.4 Labs (3/14): LDL 108, HDL 30, LFTs normal Labs (4/09): K 4.2, creatinine 1.6, LFTs normal, LDL 114, TSH 10.6 (elevated), free T4/free T3 normal Labs (2/15): K 4.6, creatinine 1.5, LFTs normal, TSH 7.6 (elevated), free T4 and free T3 normal Labs (3/15): K 3.6, creatinine 1.5, LFTs normal, TSH normal Labs (9/15): K 4.3, creatinine 1.6, LFTs normal, TSH normal, LDL 115, HDL 24 Labs (10/16): K 4.5, creatinine 1.5, LDL 90, HDL 35, hgb 12.8 Labs (11/16): Low free T3, normal TSH and free T4 Labs (12/16): K 5, creatinine 1.8, LFTs normal, TSH 8.6 (mildly elevated) Labs (1/17): Free  T4/TSH/free T3 normal, LFTs normal Labs (2/17): K 4.4, creatinine 2.4, LDL 131, HDL 34 Labs (4/17): K 4.2, creatinine 1.3, LDL 67, HDL 35 Labs (9/17): K 4.9, creatinine 2.2, LDL 54, HDL 35, free T3 and T4 normal, hgb 12 Labs (4/18): K 5, creatinine 1.78, LFTs normal, TSH elevated but free T3 and free T4 normal.  Labs (6/18): LDL 52, HDL 35, TSH elevated 6.8, LFTs normal Labs (7/18): K 4.3, creatinine 1.68 Labs (10/18): TSH 5.6, free T3 normal, free T4 normal, LFTs normal, K 4.9, creatinine 1.4 Labs (11/18): K 4.5, creatinine 1.46, LFTs normal Labs (4/19): K 4.8, creatinine 1.6, LFTs normal Labs (6/19): TSH slightly elevated, free T3/T4 both normal, LFTs normal Labs (7/19): K 4.3, creatinine 1.3 Labs (10/19): LFTs normal, LDL 58, TGs 165, TSH 6.6 but free T4 and free T3 normal Labs (1/20): K 4.7, creatinine 1.7 Labs (2/20): LFTs normal Labs (4/20): K 4.6, creatinine 1.4, LDL 77, HDL 36, hgb 12.2, TSH mild elevation 7.4, free T4 and free T3 normal.  Labs (7/20): K 3.8, creatinine 1.6, hgb 12.2, LDL 81, TGs 158, HDL 35, TSH 7.7 (mildly elevated), LFTs normal Labs (11/20): K 4.5, creatinine 1.7, NT-proBNP 1358, TSH 5.6 (mild elevation), LDL 88, LFTs normal Labs (8/11): K 4.3, creatinine 1.6, hgb 12.4, LFTs, normal, TGs 105, LDL 81, HDL 40, TSH 5.6 (mildly elevated), free T3 and free T4 normal Labs (6/21): K 4.7, creatinine 1.9, AST/ALT normal, TSH mildly elevated at 7.9, free T3 and T4 normal, LDL 87, TGs 118 Labs (10/21): K 4.4, creatinine 1.8, LFTs normal, LDL 89, pro-BNP 1830, LFTs normal Labs (11/21): ESR 48, K 4.6, creatinine 1.96 Labs (12/21): ANA negative Labs (1/22): K 5.9, creatinine 4 => 2.3 Labs (2/22): K 4.6, creatinine 2.1 => 2.2, LDL 77, LFTs normal Labs (9/14): K 4.7,creatinine 2.7, TSH mildly elevated but free T3 and free T4 normal, hgb 10.2, LDL 67, LFTs normal Labs (7/82): creatinine 2.0 Labs (6/22): K 4.7, creatinine 2.4 Labs (9/22): K 4.4, creatinine 2.6, LFTs normal, hgb  9.9, LDL 62, TSH 5.94, free T4 normal, free T3 low.  Labs (10/22): TSH 4.99 Labs (12/22): LDL 57 Labs (3/23): TSH normal, free T3 and free T4 normal, K 4.7, creatinine 2.3, LFTs normal, LDL 61 Labs (6/23): hgb 10.1, K 5.1, creatinine 2.1 Labs (7/23): LDL 72, LFTs normal Labs (9/56): hgb 11.2, K 4.7, creatinine 2.2, LFTs normal, transferrin saturation 21% Labs (1/24): K 4.5, creatinine 2.3, LFTs normal, TSH slightly elevated Labs (8/24): K 4.8, creatinine 2.3, LFTs normal, LDL 70 Labs (12/24): K 4.1, creatinine 1.9   Allergies (verified):  No Known Drug Allergies   Past Medical History: 1. Coronary artery disease. The patient reports history of silent MI in 1993.  This was likely an inferior MI (see below).  - The patient presented to [facility] in 5/10 with  VT and mildly elevated cardiac enzymes. LHC (5/10):  Inferobasal dyskinesis with EF 35-40%.  There was chronic total occlusion of the mid RCA with good collaterals.  Luminals LCA.  This did not appear to be an acute cause of the 5/10 event.  - LHC (3/18): Known occlusion of RCA with collaterals, new occlusion of ramus.  DES x 3 to ramus.  - LHC (9/21): Known occlusion RCA with collaterals, 95% OMI treated with DES.  2. Hypertension.  3. Hyperlipidemia: Intolerant of higher doses of simvastatin, Crestor, Lipitor, pravastatin. Zetia caused constipation.  4. Remote tobacco abuse with 47-pack-year history, quitting in August 2009.  5. Peripheral arterial disease.  - Status post left-to-right fem-fem bypass performed in [place] in March 2009.  - Status post redo left-to-right fem-fem bypass performed by Dr. Edilia Bo at [facility] in 2009.  - ABIs normal 3/15, ABIs normal 3/16, ABIs normal 3/17, ABIs normal 4/18, ABIs normal 4/19, ABIs normal 12/20. ABIs stable 3/24.  6. Rheumatoid arthritis, on leflunomide.  7.  Ischemic cardiomyopathy:  EF 35-40% by LV-gram 5/10 with inferobasal dyskinesis.  Echo 5/10 showed EF 40% with mild LVH, no  significant MR, inferobasal and posterobasal akinesis.  Echo (7/11): EF 50%, mild LVH, basal-mid inferoposterior akinesis.  Echo (7/12): EF 45-50% with basal anterolateral, basal posterior, and basal to mid inferior akinesis.  Echo (4/16): EF 40-45%, basal to mid inferolateral AK, basal inferior AK, basal to mid anterolateral HK.  Echo (2/17): EF 40% with basal to mid inferior akinesis, basal inferolateral aneurysm, mid inferolateral akinesis, basal anterolateral hypokinesis, normal RV size and systolic function, PASP 25 mmHg.  - Echo (3/18) with EF 30-35%, moderate LV dilation, moderate MR, mildly decreased RV systolic function, PASP 51 mmmHg. - RHC (3/18): mean RA 4, PA 38/12, mean PCWP 17, CI 2.2.  - Echo (6/19): EF 35-40%, inferior/inferolateral/anterolateral WMAs, moderate MR likely infarct-related, normal RV size with mildly decreased systolic function.  - Echo (1/21): EF 35-40% with wall motion abnormalities. Echo was done today and reviewed, EF 40-45% with basal to mid inferolateral and inferior akinesis, mild MR, normal RV, PASP 35 mmHg.  - RHC (9/21): mean RA 4, PA 33/12, mean PCWP 9, CI 3.12 - Echo (6/22): EF 30-35%, basal-mid inferior and inferolateral akinesis, anterolateral hypokinesis, moderate MR (appears infarct-related), mildly decreased RV function.  - CRT-D upgrade (Medtronic) in 6/23.  - Echo (2/24): EF 25-30% with anterolateral/inferolateral akinesis, basal-mid inferior akinesis, mild RV dysfunction, mild-moderate MR. - CPX (10/24): submaximal with RER 1.04, peak VO2 11.6, VE/VCO2 slope 54; severe HF limitation, also with deconditioning.  - RHC (11/24): mean RA 2, PA 31/7, mean PCWP 11, CI 2.11 Fick/2.35 thermodilution.  8.  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  He has a Medtronic CRT-D device.  9.  Atrial flutter:  Status post isthmus ablation 5/10.   10. Restrictive lung defect: PFTs (7/11): FVC 74%, FEV1 80%, ratio  75%, TLC 78%, DLCO 68%.  This suggests a mild restrictive defect and a mild obstructive defect.  He did have response to bronchodilator. These PFTs were significantly better than the report from Dr. Blenda Nicely in [place] done prior.  He had last PFTs in 2/12 with no significant change.  - PFTs (9/21) with significantly decreased DLCO and concern for ILD/pulmonary fibrosis.  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  11.  Anxiety 12.  Chronic cough: No relief with change from ACEI to ARB or with trial of PPI.  13.  CKD: Stage 3.  14. Mitral regurgitation: Moderate on 6/22 echo, suspect infarct-related.  15. AAA: 3.1 cm AAA on abdominal US 12/20  - Abdominal US 5/22 with 3.1 cm AAA - Abdominal US 2/24 with 3.4 cm AAA 16. COVID-19 infection: 1/22.   Current Outpatient Medications  Medication Sig Dispense Refill   acetaminophen (TYLENOL) 500 MG tablet Take 1,000 mg by mouth every 6 (six) hours as needed (for pain.).     amiodarone (PACERONE) 200 MG tablet Take 1/2 (one-half) tablet by mouth once daily 45 tablet 3   carvedilol (COREG) 12.5 MG tablet Take 1 tablet by mouth twice daily 180 tablet 0   chlorhexidine (PERIDEX) 0.12 % solution Use as directed 15 mLs in the mouth or throat daily as needed (irritation).   3   clopidogrel (PLAVIX) 75 MG tablet Take 1 tablet (75 mg total) by mouth daily with breakfast. 90 tablet 3   dapagliflozin propanediol (FARXIGA) 10 MG TABS tablet Take 1 tablet (10 mg total) by mouth daily before breakfast.     eplerenone (INSPRA) 25 MG tablet Take 1 tablet by mouth once daily 90 tablet 0   fluticasone (FLONASE) 50 MCG/ACT nasal spray Place 1-2 sprays into both nostrils daily as needed for allergies or rhinitis.     leflunomide (ARAVA) 20 MG tablet Take 20 mg by mouth daily.     levothyroxine (SYNTHROID) 25 MCG tablet TAKE 1 TABLET BY MOUTH ONCE DAILY BEFORE BREAKFAST 90 tablet 3   Melatonin 10 MG TABS Take 10 mg by mouth at bedtime as needed  (sleep).      simvastatin (ZOCOR) 20 MG tablet TAKE 1 TABLET BY MOUTH EVERYDAY AT BEDTIME 90 tablet 3   furosemide (LASIX) 40 MG tablet Take 1 tablet (40 mg total) by mouth 2 (two) times daily for 2 days, THEN 1 tablet (40 mg total) every morning, THEN 0.5 tablets (20 mg total) every evening. 90 tablet 4   No current facility-administered medications for this encounter.    Allergies:   Atorvastatin, Crestor [rosuvastatin calcium], Enalapril, Lisinopril, Losartan, and Telmisartan   Social History:  The patient  reports that he quit smoking about 15 years ago. His smoking use included cigarettes. He started smoking about 63 years ago. He has a 47 pack-year smoking history. He has never been exposed to tobacco smoke. He has never used smokeless tobacco. He reports current alcohol use. He reports that he does not use drugs.   Family History:  The patient's family history includes Gastric cancer in his mother; Heart attack (age of onset: 2) in his brother; Peripheral vascular disease in his father; Throat cancer in his father.   ROS:  Please see the history of present illness.   All other systems are personally reviewed and negative.   Exam:   BP 130/78   Pulse (!) 110   Ht 5\' 10"  (1.778 m)   Wt 71.6 kg (157 lb 12.8 oz)   BMI 22.64 kg/m  General: NAD Neck: JVP 8-9 cm, no thyromegaly or thyroid nodule.  Lungs: Dry crackles at bases.  CV: Nondisplaced PMI.  Heart regular S1/S2, no S3/S4, no murmur.  Trace ankle edema.  No carotid bruit.  Normal pedal pulses.  Abdomen: Soft, nontender, no hepatosplenomegaly, no distention.  Skin: Intact without lesions or rashes.  Neurologic: Alert and oriented x 3.  Psych: Normal affect. Extremities: No clubbing or cyanosis.  HEENT: Normal.   Recent Labs: 02/25/2023: ALT 11; B Natriuretic Peptide 1,104.4; BUN 37; Creatinine, Ser  2.37; Platelets 183; TSH 3.520 03/18/2023: Hemoglobin 9.9; Potassium 3.9; Sodium 137  Personally reviewed   Wt Readings from  Last 3 Encounters:  04/24/23 71.6 kg (157 lb 12.8 oz)  03/18/23 62.1 kg (137 lb)  03/03/23 65.1 kg (143 lb 9.6 oz)     ASSESSMENT AND PLAN:  1. Chronic systolic CHF: Ischemic cardiomyopathy.  Medtronic CRT-D device.  Echo in 6/22 with EF 30-35%, mild RV dysfunction. Upgraded to CRT device given high percentage of RV pacing.  Echo 2/24 showed EF 25-30% with anterolateral/inferolateral akinesis, basal-mid inferior akinesis, mild RV dysfunction, mild-moderate MR.  CPX in 10/24 was submaximal but suggestive of severe HF limitation.  RHC In 11/24 showed normal filling pressures with low, but not markedly low, CI (2.11 Fick, 2.35 thermo). Chronic NYHA class III symptoms, slowly worsening with prominent fatigue.  Pulmonary fibrosis plays a role in his symptoms as well.  Today, he is volume overloaded on exam and by Optivol with weight up.  CKD limits medication titration.  - Continue Coreg 12.5 mg bid.       - Increase Lasix to 40 mg bid x 2 days then 40 qam/20 qpm after that. BMET in 10 days.   - Continue eplerenone 25 mg daily. Increase in future if creatinine remains stable.   - He is off valsartan for now with elevated creatinine, may be able to start back on low dose in the future.    - Continue Farxiga 10 mg daily.  - He would be a difficult LVAD candidate with CKD and his lung disease (pulmonary fibrosis).  He says that he would not be interested in open heart surgery and also does not have anyone who could stay with him as a caregiver post-op.  RHC in 10/24 did not show markedly low output though CPX showed a severe functional limitation.  - Though he does not want an LVAD, he would be open to baroreceptor activation therapy.  He was seen by Dr. Myra Gianotti and thought to be a reasonable candidate. We will check into where he stands in the process, needs pro-BNP today.  2. CAD: Denies chest pain.  Had DES x 3 to ramus in 3/18 and DES to OM1 in 9/21.   - Continue Zocor, lipids ok in 8/24.  - Continue  Plavix long-term, off ASA.    3. Hyperlipidemia: Stable on simvastatin without myalgias.  Has not been able to tolerate higher dose of simvastatin or other statins. Unable to tolerate Zetia due to constipation.  Lipids acceptable in 8/24.  4. Hx of VT: Has been on amiodarone 100 mg daily. Has ICD.  He did not tolerate sotalol.  - He is on Levoxyl for amiodarone-induced mild hypothyroidism.   - Knows to get a yearly eye exam - Recent LFTs normal.   - See below regarding ?amiodarone lung toxicity.  5. PAD: He denies claudication.  Stable ABIs 2/24 at [facility]. 6. AAA: 3.4 cm in 2/24, followed by [facility].  7. CKD: Stage 3.  Last creatinine 1.9.  - Follows with nephrology now.    8. Pulmonary: PFTs were done with increased dyspnea and amiodorone use, concerning for ILD/pulmonary fibrosis.  High resolution CT chest was concerning for ILD.  I have worried about amiodarone-induced lung toxicity. 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  He was offered antifibrotic treatment but wants to hold off for now. I think that some of his symptomatology is due to IPF.  - Can continue amiodarone  100 mg daily as above for VT suppression.  - Pulmonary followup with Dr. Isaiah Serge.   9. Anemia: Check Fe studies, will give IV Fe if low (may help with fatigue).   Followup with APP in 6 wks.    Signed, Marca Ancona, MD  04/24/2023   [facility] [address] [facility] [place] [address] [phone] (office) [phone] (fax)

## 2023-04-28 ENCOUNTER — Encounter: Payer: Self-pay | Admitting: Surgery

## 2023-04-28 ENCOUNTER — Encounter (HOSPITAL_COMMUNITY): Payer: Self-pay | Admitting: Cardiology

## 2023-04-28 ENCOUNTER — Other Ambulatory Visit (HOSPITAL_COMMUNITY): Payer: Self-pay

## 2023-04-28 DIAGNOSIS — I5022 Chronic systolic (congestive) heart failure: Secondary | ICD-10-CM

## 2023-04-28 DIAGNOSIS — E611 Iron deficiency: Secondary | ICD-10-CM

## 2023-04-28 NOTE — Progress Notes (Signed)
Orders placed for Iron infusion

## 2023-05-06 ENCOUNTER — Encounter: Payer: Self-pay | Admitting: Surgery

## 2023-05-06 ENCOUNTER — Encounter (HOSPITAL_COMMUNITY): Payer: Self-pay | Admitting: Cardiology

## 2023-05-06 ENCOUNTER — Telehealth (HOSPITAL_COMMUNITY): Payer: Self-pay | Admitting: Cardiology

## 2023-05-06 NOTE — Telephone Encounter (Signed)
proBNP 13700 (see attachment) Drawn 05/06/23  Ok to proceed with barostim workup  Thanks

## 2023-05-08 ENCOUNTER — Encounter: Payer: Self-pay | Admitting: Cardiology

## 2023-05-08 ENCOUNTER — Other Ambulatory Visit (HOSPITAL_COMMUNITY): Payer: Self-pay | Admitting: Cardiology

## 2023-05-10 ENCOUNTER — Encounter (HOSPITAL_COMMUNITY): Payer: Self-pay | Admitting: Cardiology

## 2023-05-11 ENCOUNTER — Other Ambulatory Visit (HOSPITAL_COMMUNITY): Payer: Self-pay | Admitting: Cardiology

## 2023-05-14 ENCOUNTER — Encounter (HOSPITAL_COMMUNITY): Payer: Self-pay | Admitting: Cardiology

## 2023-05-14 NOTE — Telephone Encounter (Signed)
   Labs 04/24/23 Iron    56 TIBC 356 Sat    16 UBIC 300   Would you still like iron infusion 1/31

## 2023-05-20 ENCOUNTER — Telehealth: Payer: Self-pay

## 2023-05-20 NOTE — Telephone Encounter (Signed)
 A Fast Appeal was rescheduled for a Barostim procedure denied by [facility].  Patient aware of appeal started on 05/13/23.  I called to expedite the appeal today (05/21/23).  [employer] Rep said I should hear something within 72 hrs.

## 2023-05-22 ENCOUNTER — Telehealth: Payer: Self-pay

## 2023-05-22 NOTE — Telephone Encounter (Signed)
I called patient to advise him that his Barostim has been approved on appeal.  He is concerned that it was precerted as an out-patient procedure.  He has transportation issues and wants to be admitted as inpatient.  He says he also needs an iron infusion prior to his procedure.  I advised him that we normally precert Barostims as outpatient and if I were to change it, it may start the authorization process all over again, without a guarantee of a favorable outcome.  He said he will call [facility] for himself and get back to me.  Patient sent a message a few minutes later wanting to proceed with out-pt surgery.  Message was sent to the surgery scheduler.

## 2023-05-23 ENCOUNTER — Encounter (HOSPITAL_COMMUNITY): Payer: Self-pay | Admitting: Cardiology

## 2023-05-23 NOTE — Telephone Encounter (Signed)
Information noted.

## 2023-05-30 ENCOUNTER — Other Ambulatory Visit: Payer: Self-pay

## 2023-05-30 DIAGNOSIS — I5022 Chronic systolic (congestive) heart failure: Secondary | ICD-10-CM

## 2023-06-04 ENCOUNTER — Encounter (HOSPITAL_COMMUNITY): Payer: Self-pay

## 2023-06-04 ENCOUNTER — Other Ambulatory Visit (HOSPITAL_COMMUNITY): Payer: Self-pay

## 2023-06-04 NOTE — Progress Notes (Signed)
ID:  Nathan Pruitt, DOB [birth year], MRN [id number]   Provider location: [place] [facility] [facility] Type of Visit: Established patient   PCP:  Nathan Ponto, MD  Cardiologist:  Nathan Ancona, MD  HPI: Nathan Pruitt is a 78 y.o. male with a history of history of CAD, PAD, ischemic CMP, and VT s/p ICD.   Echo in 2/17 showed EF 40% and regional WMAs.   In 3/18, he developed episodes of VT as well as increased dyspnea.  LHC/RHC was done, showing new occlusion of a large ramus (CTO, probably a month or so old at time of cath) and old CTO RCA with collaterals.  He had DES x 3 to ramus. Filling pressures were optimized on RHC.  Amiodarone was increased to 200 mg daily from 100 mg daily.  No VT since PCI.    Echo in 6/19 showed EF 35-40% with wall motion abnormalities. Echo in 1/21 showed EF 40-45% with basal to mid inferolateral and inferior akinesis, mild MR, normal RV, PASP 35 mmHg.   LHC/RHC in 9/21 with normal filling pressures and cardiac output but 95% OM1 stenosis.  This was treated with DES to OM1.   He has been following with Dr. [name] with idiopathic pulmonary fibrosis.  I was concerned that he could have amiodarone -related lung toxicity but Dr. [name] did not think this was very likely.  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     Labs drawn in 1/22 showed K up to 5.9 and creatinine 4.  He was sent to the ER at [facility].  He was found to have COVID-19 and was admitted. He had a productive cough and sore throat.  He was thought to be dehydrated and got IV fluid.  Creatinine 2.3 at discharge.  Lasix, valsartan, and eplerenone were stopped.  Other meds were continued.  Echo in 6/22 showed EF 30-35%, basal-mid inferior and inferolateral akinesis, anterolateral hypokinesis, moderate MR (appears infarct-related), mildly decreased RV function.   In 6/23, he had upgrade of device to CRT-D given frequent RV pacing.  Echo in 2/24 showed EF  25-30% with anterolateral/inferolateral akinesis, basal-mid inferior akinesis, mild RV dysfunction, mild-moderate MR.   CPX in 10/24 was a submaximal study but probably severe HF limitation as well as deconditioning.   RHC in 11/24 showed normal filling pressures and CI 2.11 by Fick, 2.35 by thermodilution.   Today he returns for HF follow up. Overall feeling fair. Limited mostly by fatigue, no increase in SOB with walking on flat ground. Denies palpitations, CP, dizziness, edema, or PND/Orthopnea. Appetite ok. No fever or chills. Taking all medications. Prefers to see Nathan Pruitt and discuss concerns. Going for iron infusion today.  ECG (personally reviewed): none ordered today.  Medtronic device interrogation: 95.7% BiV pacing, 03 hr/day activity, < 0.1 hr/day AF, 6 runs of NSVT   Labs (9/23): hgb 11.2, K 4.7, creatinine 2.2, LFTs normal, transferrin saturation 21% Labs (1/24): K 4.5, creatinine 2.3, LFTs normal, TSH slightly elevated Labs (8/24): K 4.8, creatinine 2.3, LFTs normal, LDL 70 Labs (12/24): K 4.9, creatinine 2.0   Allergies (verified):  No Known Drug Allergies   Past Medical History: 1. Coronary artery disease. The patient reports history of silent MI in 1993.  This was likely an inferior MI (see below).  - The patient presented to [facility] in 5/10 with VT and mildly elevated cardiac enzymes. LHC (5/10):  Inferobasal dyskinesis with EF 35-40%.  There  was chronic total occlusion of the mid RCA with good collaterals.  Luminals LCA.  This did not appear to be an acute cause of the 5/10 event.  - LHC (3/18): Known occlusion of RCA with collaterals, new occlusion of ramus.  DES x 3 to ramus.  - LHC (9/21): Known occlusion RCA with collaterals, 95% OMI treated with DES.  2. Hypertension.  3. Hyperlipidemia: Intolerant of higher doses of simvastatin, Crestor, Lipitor, pravastatin. Zetia caused constipation.  4. Remote tobacco abuse with 47-pack-year history, quitting in August  2009.  5. Peripheral arterial disease.  - Status post left-to-right fem-fem bypass performed in [place] in March 2009.  - Status post redo left-to-right fem-fem bypass performed by Dr. Edilia Pruitt at [facility] in 2009.  - ABIs normal 3/15, ABIs normal 3/16, ABIs normal 3/17, ABIs normal 4/18, ABIs normal 4/19, ABIs normal 12/20. ABIs stable 3/24.  6. Rheumatoid arthritis, on leflunomide.  7.  Ischemic cardiomyopathy:  EF 35-40% by LV-gram 5/10 with inferobasal dyskinesis.  Echo 5/10 showed EF 40% with mild LVH, no significant MR, inferobasal and posterobasal akinesis.  Echo (7/11): EF 50%, mild LVH, basal-mid inferoposterior akinesis.  Echo (7/12): EF 45-50% with basal anterolateral, basal posterior, and basal to mid inferior akinesis.  Echo (4/16): EF 40-45%, basal to mid inferolateral AK, basal inferior AK, basal to mid anterolateral HK.  Echo (2/17): EF 40% with basal to mid inferior akinesis, basal inferolateral aneurysm, mid inferolateral akinesis, basal anterolateral hypokinesis, normal RV size and systolic function, PASP 25 mmHg.  - Echo (3/18) with EF 30-35%, moderate LV dilation, moderate MR, mildly decreased RV systolic function, PASP 51 mmmHg. - RHC (3/18): mean RA 4, PA 38/12, mean PCWP 17, CI 2.2.  - Echo (6/19): EF 35-40%, inferior/inferolateral/anterolateral WMAs, moderate MR likely infarct-related, normal RV size with mildly decreased systolic function.  - Echo (1/21): EF 35-40% with wall motion abnormalities. Echo was done today and reviewed, EF 40-45% with basal to mid inferolateral and inferior akinesis, mild MR, normal RV, PASP 35 mmHg.  - RHC (9/21): mean RA 4, PA 33/12, mean PCWP 9, CI 3.12 - Echo (6/22): EF 30-35%, basal-mid inferior and inferolateral akinesis, anterolateral hypokinesis, moderate MR (appears infarct-related), mildly decreased RV function.  - CRT-D upgrade (Medtronic) in 6/23.  - Echo (2/24): EF 25-30% with anterolateral/inferolateral akinesis, basal-mid inferior  akinesis, mild RV dysfunction, mild-moderate MR. - CPX (10/24): submaximal with RER 1.04, peak VO2 11.6, VE/VCO2 slope 54; severe HF limitation, also with deconditioning.  - RHC (11/24): mean RA 2, PA 31/7, mean PCWP 11, CI 2.11 Fick/2.35 thermodilution.  8.  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  He has a Medtronic CRT-D device.  9.  Atrial flutter:  Status post isthmus ablation 5/10.   10. Restrictive lung defect: PFTs (7/11): FVC 74%, FEV1 80%, ratio 75%, TLC 78%, DLCO 68%.  This suggests a mild restrictive defect and a mild obstructive defect.  He did have response to bronchodilator. These PFTs were significantly better than the report from Dr. Blenda Nicely in [place] done prior.  He had last PFTs in 2/12 with no significant change.  - PFTs (9/21) with significantly decreased DLCO and concern for ILD/pulmonary fibrosis.  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  11.  Anxiety 12.  Chronic cough: No relief with change from ACEI to ARB or with trial of PPI.  13.  CKD: Stage 3.  14. Mitral regurgitation: Moderate on 6/22 echo, suspect infarct-related.  15. AAA: 3.1 cm AAA on abdominal US 12/20  - Abdominal US 5/22 with 3.1 cm AAA - Abdominal US 2/24 with 3.4 cm AAA 16. COVID-19 infection: 1/22.   Current Outpatient Medications  Medication Sig Dispense Refill   acetaminophen (TYLENOL) 500 MG tablet Take 1,000 mg by mouth every 6 (six) hours as needed (for pain.).     amiodarone (PACERONE) 200 MG tablet Take 1/2 (one-half) tablet by mouth once daily 45 tablet 3   carvedilol (COREG) 12.5 MG tablet Take 1 tablet by mouth twice daily 180 tablet 3   chlorhexidine (PERIDEX) 0.12 % solution Use as directed 15 mLs in the mouth or throat daily as needed (irritation).   3   cholecalciferol (VITAMIN D3) 25 MCG (1000 UNIT) tablet Take 1,000 Units by mouth daily.     clopidogrel (PLAVIX) 75 MG tablet Take 1 tablet (75 mg  total) by mouth daily with breakfast. 90 tablet 3   dapagliflozin propanediol (FARXIGA) 10 MG TABS tablet Take 1 tablet (10 mg total) by mouth daily before breakfast.     eplerenone (INSPRA) 25 MG tablet Take 1 tablet by mouth once daily 90 tablet 3   fluticasone (FLONASE) 50 MCG/ACT nasal spray Place 1-2 sprays into both nostrils daily as needed for allergies or rhinitis.     furosemide (LASIX) 40 MG tablet Take 1 tablet (40 mg total) by mouth every morning AND 0.5 tablets (20 mg total) every evening. 135 tablet 3   leflunomide (ARAVA) 20 MG tablet Take 20 mg by mouth daily.     levothyroxine (SYNTHROID) 25 MCG tablet TAKE 1 TABLET BY MOUTH ONCE DAILY BEFORE BREAKFAST 90 tablet 3   Melatonin 10 MG TABS Take 10 mg by mouth at bedtime as needed (sleep).      simvastatin (ZOCOR) 20 MG tablet TAKE 1 TABLET BY MOUTH ONCE DAILY AT BEDTIME 90 tablet 3   No current facility-administered medications for this encounter.    Allergies:   Atorvastatin, Crestor [rosuvastatin calcium], Enalapril, Lisinopril, Losartan, and Telmisartan   Social History:  The patient  reports that he quit smoking about 16 years ago. His smoking use included cigarettes. He started smoking about 63 years ago. He has a 47 pack-year smoking history. He has never been exposed to tobacco smoke. He has never used smokeless tobacco. He reports current alcohol use. He reports that he does not use drugs.   Family History:  The patient's family history includes Gastric cancer in his mother; Heart attack (age of onset: 43) in his brother; Peripheral vascular disease in his father; Throat cancer in his father.   ROS:  Please see the history of present illness.   All other systems are personally reviewed and negative.    Wt Readings from Last 3 Encounters:  06/06/23 64.5 kg (142 lb 3.2 oz)  04/24/23 71.6 kg (157 lb 12.8 oz)  03/18/23 62.1 kg (137 lb)   BP 116/62   Pulse 66   Wt 64.5 kg (142 lb 3.2 oz)   SpO2 96%   BMI 20.40 kg/m    Exam:   General:  NAD. No resp difficulty, walked into clinic, elderly and frail HEENT: Normal Neck: Supple. No JVD. Carotids 2+ bilat; no bruits. No lymphadenopathy or thryomegaly appreciated. Cor: PMI nondisplaced. Regular rate & rhythm. No rubs, gallops or murmurs. Lungs: Diminished throughout Abdomen: Soft, nontender, nondistended. No hepatosplenomegaly. No bruits or masses. Good bowel sounds. Extremities: No cyanosis, clubbing, rash, edema Neuro: Alert & oriented x 3, cranial nerves grossly  intact. Moves all 4 extremities w/o difficulty. Affect pleasant.  Recent Labs: 02/25/2023: ALT 11; B Natriuretic Peptide 1,104.4; BUN 37; Creatinine, Ser 2.37; Platelets 183; TSH 3.520 03/18/2023: Hemoglobin 9.9; Potassium 3.9; Sodium 137  Personally reviewed    ASSESSMENT AND PLAN: 1. Chronic systolic CHF: Ischemic cardiomyopathy.  Medtronic CRT-D device.  Echo in 6/22 with EF 30-35%, mild RV dysfunction. Upgraded to CRT device given high percentage of RV pacing.  Echo 2/24 showed EF 25-30% with anterolateral/inferolateral akinesis, basal-mid inferior akinesis, mild RV dysfunction, mild-moderate MR.  CPX in 10/24 was submaximal but suggestive of severe HF limitation.  RHC In 11/24 showed normal filling pressures with low, but not markedly low, CI (2.11 Fick, 2.35 thermo). Chronic NYHA class III symptoms, slowly worsening with prominent fatigue.  Pulmonary fibrosis plays a role in his symptoms as well.  Today, he is not volume overloaded on exam or by OptiVol. CKD limits medication titration.  - Continue Coreg 12.5 mg bid.       - Continue Farxiga 10 mg daily.  - Continue Lasix 40 qam/20 qpm. Recent labs reviewed from 05/06/23, K 4, SCr 2.0. Will not repeat today. - Continue eplerenone 25 mg daily. Increase in future if creatinine remains stable.   - He is off valsartan for now with elevated creatinine, may be able to start back on low dose in the future.    - He would be a difficult LVAD candidate  with CKD and his lung disease (pulmonary fibrosis).  He says that he would not be interested in open heart surgery and also does not have anyone who could stay with him as a caregiver post-op.  RHC in 10/24 did not show markedly low output though CPX showed a severe functional limitation.  - Though he does not want an LVAD, he would be open to baroreceptor activation therapy.  He was seen by Dr. Myra Gianotti and planning on Barostim implant 2/29/25. 2. CAD: Denies chest pain.  Had DES x 3 to ramus in 3/18 and DES to OM1 in 9/21.   - Continue Zocor, lipids ok in 8/24.  - Continue Plavix long-term, off ASA.    3. Hyperlipidemia: Stable on simvastatin without myalgias.  Has not been able to tolerate higher dose of simvastatin or other statins. Unable to tolerate Zetia due to constipation.  Lipids acceptable in 8/24.  4. Hx of VT: Has been on amiodarone 100 mg daily. Has ICD.  He did not tolerate sotalol.  - He is on Levoxyl for amiodarone-induced mild hypothyroidism.   - Knows to get a yearly eye exam - Recent LFTs normal.   - See below regarding ?amiodarone lung toxicity.  5. PAD: Stable ABIs 2/24 at [facility]. 6. AAA: 3.4 cm in 2/24, followed by [facility].  7. CKD: Stage 3.  Last creatinine 2.0 - Follows with nephrology now.    8. Pulmonary: PFTs were done with increased dyspnea and amiodorone use, concerning for ILD/pulmonary fibrosis.  High resolution CT chest was concerning for ILD.  I have worried about amiodarone-induced lung toxicity. 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  He was offered antifibrotic treatment but wants to hold off for now. I think that some of his symptomatology is due to IPF.  - Can continue amiodarone 100 mg daily as above for VT suppression.  - Pulmonary followup with Dr. [name].   9. Anemia: We have arranged iron infusion today.  Follow up in 3-4 months with Nathan Pruitt  Signed, Jacklynn Ganong, FNP  06/06/2023   [facility] [address] [facility] [place] [address] [phone] (office) [phone] (fax)

## 2023-06-05 MED ORDER — CLOPIDOGREL BISULFATE 75 MG PO TABS: 75.0000 mg | ORAL_TABLET | Freq: Every day | ORAL | 3 refills | Status: DC

## 2023-06-05 MED ORDER — FUROSEMIDE 40 MG PO TABS: ORAL_TABLET | ORAL | 3 refills | Status: DC

## 2023-06-06 ENCOUNTER — Encounter (HOSPITAL_COMMUNITY)
Admission: RE | Admit: 2023-06-06 | Discharge: 2023-06-06 | Disposition: A | Discharge status: Home / Self Care | Payer: Medicare Other | Source: Ambulatory Visit | Attending: Cardiology | Admitting: Cardiology

## 2023-06-06 ENCOUNTER — Ambulatory Visit (HOSPITAL_COMMUNITY)
Admission: RE | Admit: 2023-06-06 | Discharge: 2023-06-06 | Disposition: A | Discharge status: Home / Self Care | Payer: Medicare Other | Source: Ambulatory Visit | Attending: Family Medicine | Admitting: Family Medicine

## 2023-06-06 VITALS — BP 116/62 | HR 66 | Wt 142.2 lb

## 2023-06-06 DIAGNOSIS — E611 Iron deficiency: Secondary | ICD-10-CM | POA: Diagnosis present

## 2023-06-06 DIAGNOSIS — I5022 Chronic systolic (congestive) heart failure: Secondary | ICD-10-CM | POA: Insufficient documentation

## 2023-06-06 DIAGNOSIS — E785 Hyperlipidemia, unspecified: Secondary | ICD-10-CM | POA: Diagnosis not present

## 2023-06-06 DIAGNOSIS — I472 Ventricular tachycardia, unspecified: Secondary | ICD-10-CM | POA: Diagnosis not present

## 2023-06-06 DIAGNOSIS — E039 Hypothyroidism, unspecified: Secondary | ICD-10-CM | POA: Insufficient documentation

## 2023-06-06 DIAGNOSIS — I2510 Atherosclerotic heart disease of native coronary artery without angina pectoris: Secondary | ICD-10-CM | POA: Diagnosis not present

## 2023-06-06 DIAGNOSIS — I255 Ischemic cardiomyopathy: Secondary | ICD-10-CM | POA: Insufficient documentation

## 2023-06-06 DIAGNOSIS — I130 Hypertensive heart and chronic kidney disease with heart failure and stage 1 through stage 4 chronic kidney disease, or unspecified chronic kidney disease: Secondary | ICD-10-CM | POA: Insufficient documentation

## 2023-06-06 DIAGNOSIS — Z7902 Long term (current) use of antithrombotics/antiplatelets: Secondary | ICD-10-CM | POA: Diagnosis not present

## 2023-06-06 DIAGNOSIS — Z79899 Other long term (current) drug therapy: Secondary | ICD-10-CM | POA: Diagnosis not present

## 2023-06-06 DIAGNOSIS — Z79890 Hormone replacement therapy (postmenopausal): Secondary | ICD-10-CM | POA: Diagnosis not present

## 2023-06-06 DIAGNOSIS — D649 Anemia, unspecified: Secondary | ICD-10-CM | POA: Insufficient documentation

## 2023-06-06 DIAGNOSIS — I739 Peripheral vascular disease, unspecified: Secondary | ICD-10-CM | POA: Diagnosis not present

## 2023-06-06 DIAGNOSIS — N183 Chronic kidney disease, stage 3 unspecified: Secondary | ICD-10-CM | POA: Diagnosis not present

## 2023-06-06 DIAGNOSIS — I779 Disorder of arteries and arterioles, unspecified: Secondary | ICD-10-CM

## 2023-06-06 DIAGNOSIS — I714 Abdominal aortic aneurysm, without rupture, unspecified: Secondary | ICD-10-CM | POA: Diagnosis not present

## 2023-06-06 MED ORDER — SODIUM CHLORIDE 0.9 % IV SOLN
510.0000 mg | Freq: Once | INTRAVENOUS | Status: AC
  Administered 2023-06-06: 510 mg via INTRAVENOUS
  Filled 2023-06-06: qty 510

## 2023-06-06 MED ORDER — FERUMOXYTOL INJECTION 510 MG/17 ML: 510.0000 mg | Freq: Once | INTRAVENOUS | Status: DC

## 2023-06-06 NOTE — Patient Instructions (Addendum)
Thank you for coming in today  If you had labs drawn today, any labs that are abnormal the clinic will call you No news is good news  Medications: No changes  Follow up appointments:  Your physician recommends that you schedule a follow-up appointment in:  3-4 months With Dr. Earlean Shawl will receive a reminder letter in the mail a few months in advance. If you don't receive a letter, please call our office to schedule the follow-up appointment.    Do the following things EVERYDAY: Weigh yourself in the morning before breakfast. Write it down and keep it in a log. Take your medicines as prescribed Eat low salt foods--Limit salt (sodium) to 2000 mg per day.  Stay as active as you can everyday Limit all fluids for the day to less than 2 liters   At the [facility], you and your health needs are our priority. As part of our continuing mission to provide you with exceptional heart care, we have created designated Provider Care Teams. These Care Teams include your primary Cardiologist (physician) and Advanced Practice Providers (APPs- Physician Assistants and Nurse Practitioners) who all work together to provide you with the care you need, when you need it.   You may see any of the following providers on your designated Care Team at your next follow up: Dr Arvilla Meres Dr Marca Ancona Dr. Marcos Eke, NP Robbie Lis, [place] [facility] [place], [place] Brynda Peon, NP Karle Plumber, PharmD   Please be sure to bring in all your medications bottles to every appointment.    Thank you for choosing [facility]  If you have any questions or concerns before your next appointment please send [place] a message through [place] or call our office at [phone].    TO LEAVE A MESSAGE FOR THE NURSE SELECT OPTION 2, PLEASE LEAVE A MESSAGE INCLUDING: YOUR NAME DATE OF BIRTH CALL BACK NUMBER REASON FOR  CALL**this is important as we prioritize the call backs  YOU WILL RECEIVE A CALL BACK THE SAME DAY AS LONG AS YOU CALL BEFORE 4:00 PM

## 2023-06-09 ENCOUNTER — Encounter: Payer: Self-pay | Admitting: Surgery

## 2023-06-12 ENCOUNTER — Other Ambulatory Visit (HOSPITAL_COMMUNITY): Payer: Self-pay | Admitting: Cardiology

## 2023-06-16 ENCOUNTER — Other Ambulatory Visit (HOSPITAL_COMMUNITY): Payer: Self-pay | Admitting: Cardiology

## 2023-06-19 ENCOUNTER — Encounter: Payer: Self-pay | Admitting: Cardiology

## 2023-06-19 NOTE — Progress Notes (Signed)
PERIOPERATIVE PRESCRIPTION FOR IMPLANTED CARDIAC DEVICE PROGRAMMING  Patient Information: Name:  Nathan Pruitt  DOB:  Dec 26, 1945  MRN:  [id number]    Planned Procedure:  Right Barostim Insertion  Surgeon:  Dr. Coral Else  Date of Procedure:  06/25/23  Cautery will be used.  Position during surgery:  Supine   Device Information:  Clinic EP Physician:  Loman Brooklyn, MD   Device Type:  Defibrillator Manufacturer and Phone #:  Medtronic: [id number] Pacemaker Dependent?:  Yes.   Date of Last Device Check:  02/03/2023 IN OFFICE Normal Device Function?:  Yes.    Electrophysiologist's Recommendations:  Have magnet available. Provide continuous ECG monitoring when magnet is used or reprogramming is to be performed.  Procedure will likely interfere with device function.  Device should be programmed:  Tachy therapies disabled and Asynchronous pacing during procedure and returned to normal programming after procedure (DEFER TO Industry presence and guidance during procedure.)   Per Device Clinic Standing Orders, Kizzie Ide, RN  4:42 PM 06/19/2023

## 2023-06-19 NOTE — Progress Notes (Signed)
Surgical Instructions   Your procedure is scheduled on Wednesday, February 19th, 2025. Report to [facility] Main Entrance "A" at 8:10 A.M., then check in with the Admitting office. Any questions or running late day of surgery: call [phone]  Questions prior to your surgery date: call [phone], Monday-Friday, 8am-4pm. If you experience any cold or flu symptoms such as cough, fever, chills, shortness of breath, etc. between now and your scheduled surgery, please notify us at the above number.     Remember:  Do not eat or drink after midnight the night before your surgery    Take these medicines the morning of surgery with A SIP OF WATER: Amiodarone (Pacerone) Carvedilol (Coreg) Levothyroxine (Synthroid)   May take these medicines IF NEEDED: Fluticasone (Flonase) Nasal Spray Acetaminophen (tylenol)   One week prior to surgery, STOP taking any Aspirin (unless otherwise instructed by your surgeon) Aleve, Naproxen, Ibuprofen, Motrin, Advil, Goody's, BC's, all herbal medications, fish oil, and non-prescription vitamins.  Per your surgeon's instructions, Plavix should be held for 5 days prior to your surgery.  Your last dose of Plavix should be Thursday, February 13th.    Please hold your Farxiga for 72 hours prior to surgery.  Your last dose of Marcelline Deist should be the morning of February 16th.                       Do NOT Smoke (Tobacco/Vaping) for 24 hours prior to your procedure.  If you use a CPAP at night, you may bring your mask/headgear for your overnight stay.   You will be asked to remove any contacts, glasses, piercing's, hearing aid's, dentures/partials prior to surgery. Please bring cases for these items if needed.    Patients discharged the day of surgery will not be allowed to drive home, and someone needs to stay with them for 24 hours.  SURGICAL WAITING ROOM VISITATION Patients may have no more than 2 support people in the waiting area - these visitors may  rotate.   Pre-op nurse will coordinate an appropriate time for 1 ADULT support person, who may not rotate, to accompany patient in pre-op.  Children under the age of 16 must have an adult with them who is not the patient and must remain in the main waiting area with an adult.  If the patient needs to stay at the hospital during part of their recovery, the visitor guidelines for inpatient rooms apply.  Please refer to the [facility] website for the visitor guidelines for any additional information.   If you received a COVID test during your pre-op visit  it is requested that you wear a mask when out in public, stay away from anyone that may not be feeling well and notify your surgeon if you develop symptoms. If you have been in contact with anyone that has tested positive in the last 10 days please notify you surgeon.      Pre-operative CHG Bathing Instructions   You can play a key role in reducing the risk of infection after surgery. Your skin needs to be as free of germs as possible. You can reduce the number of germs on your skin by washing with CHG (chlorhexidine gluconate) soap before surgery. CHG is an antiseptic soap that kills germs and continues to kill germs even after washing.   DO NOT use if you have an allergy to chlorhexidine/CHG or antibacterial soaps. If your skin becomes reddened or irritated, stop using the CHG and notify one of  our RNs at [phone].              TAKE A SHOWER THE NIGHT BEFORE SURGERY AND THE DAY OF SURGERY    Please keep in mind the following:  DO NOT shave, including legs and underarms, 48 hours prior to surgery.   You may shave your face before/day of surgery.  Place clean sheets on your bed the night before surgery Use a clean washcloth (not used since being washed) for each shower. DO NOT sleep with pet's night before surgery.  CHG Shower Instructions:  Wash your face and private area with normal soap. If you choose to wash your hair, wash  first with your normal shampoo.  After you use shampoo/soap, rinse your hair and body thoroughly to remove shampoo/soap residue.  Turn the water OFF and apply half the bottle of CHG soap to a CLEAN washcloth.  Apply CHG soap ONLY FROM YOUR NECK DOWN TO YOUR TOES (washing for 3-5 minutes)  DO NOT use CHG soap on face, private areas, open wounds, or sores.  Pay special attention to the area where your surgery is being performed.  If you are having back surgery, having someone wash your back for you may be helpful. Wait 2 minutes after CHG soap is applied, then you may rinse off the CHG soap.  Pat dry with a clean towel  Put on clean pajamas    Additional instructions for the day of surgery: DO NOT APPLY any lotions, deodorants, cologne, or perfumes.   Do not wear jewelry or makeup Do not wear nail polish, gel polish, artificial nails, or any other type of covering on natural nails (fingers and toes) Do not bring valuables to the hospital. [facility] is not responsible for valuables/personal belongings. Put on clean/comfortable clothes.  Please brush your teeth.  Ask your nurse before applying any prescription medications to the skin.

## 2023-06-20 ENCOUNTER — Encounter (HOSPITAL_COMMUNITY): Payer: Self-pay

## 2023-06-20 ENCOUNTER — Encounter (HOSPITAL_COMMUNITY)
Admission: RE | Admit: 2023-06-20 | Discharge: 2023-06-20 | Disposition: A | Discharge status: Home / Self Care | Payer: Medicare Other | Source: Ambulatory Visit | Attending: Surgery | Admitting: Surgery

## 2023-06-20 ENCOUNTER — Encounter: Payer: Self-pay | Admitting: Cardiology

## 2023-06-20 ENCOUNTER — Other Ambulatory Visit: Payer: Self-pay

## 2023-06-20 VITALS — BP 105/60 | HR 73 | Temp 97.8°F | Resp 17 | Ht 70.0 in | Wt 143.0 lb

## 2023-06-20 DIAGNOSIS — I252 Old myocardial infarction: Secondary | ICD-10-CM | POA: Diagnosis not present

## 2023-06-20 DIAGNOSIS — I080 Rheumatic disorders of both mitral and aortic valves: Secondary | ICD-10-CM | POA: Diagnosis not present

## 2023-06-20 DIAGNOSIS — I5022 Chronic systolic (congestive) heart failure: Secondary | ICD-10-CM | POA: Insufficient documentation

## 2023-06-20 DIAGNOSIS — I700 Atherosclerosis of aorta: Secondary | ICD-10-CM | POA: Insufficient documentation

## 2023-06-20 DIAGNOSIS — I2510 Atherosclerotic heart disease of native coronary artery without angina pectoris: Secondary | ICD-10-CM | POA: Diagnosis not present

## 2023-06-20 DIAGNOSIS — I255 Ischemic cardiomyopathy: Secondary | ICD-10-CM | POA: Diagnosis not present

## 2023-06-20 DIAGNOSIS — Z01818 Encounter for other preprocedural examination: Secondary | ICD-10-CM | POA: Diagnosis present

## 2023-06-20 LAB — COMPREHENSIVE METABOLIC PANEL
ALT: 12 U/L (ref 0–44)
AST: 18 U/L (ref 15–41)
Albumin: 3.8 g/dL (ref 3.5–5.0)
Alkaline Phosphatase: 69 U/L (ref 38–126)
Anion gap: 11 (ref 5–15)
BUN: 27 mg/dL — ABNORMAL HIGH (ref 8–23)
CO2: 23 mmol/L (ref 22–32)
Calcium: 8.8 mg/dL — ABNORMAL LOW (ref 8.9–10.3)
Chloride: 102 mmol/L (ref 98–111)
Creatinine, Ser: 1.98 mg/dL — ABNORMAL HIGH (ref 0.61–1.24)
GFR, Estimated: 34 mL/min — ABNORMAL LOW (ref >60)
Glucose, Bld: 112 mg/dL — ABNORMAL HIGH (ref 70–99)
Potassium: 4.3 mmol/L (ref 3.5–5.1)
Sodium: 136 mmol/L (ref 135–145)
Total Bilirubin: 0.7 mg/dL (ref 0.0–1.2)
Total Protein: 7.4 g/dL (ref 6.5–8.1)

## 2023-06-20 LAB — URINALYSIS, ROUTINE W REFLEX MICROSCOPIC
Bilirubin Urine: NEGATIVE
Glucose, UA: 50 mg/dL — AB
Hgb urine dipstick: NEGATIVE
Ketones, ur: NEGATIVE mg/dL
Leukocytes,Ua: NEGATIVE
Nitrite: NEGATIVE
Protein, ur: NEGATIVE mg/dL
Specific Gravity, Urine: 1.005 (ref 1.005–1.030)
pH: 5 (ref 5.0–8.0)

## 2023-06-20 LAB — SURGICAL PCR SCREEN
MRSA, PCR: NEGATIVE
Staphylococcus aureus: NEGATIVE

## 2023-06-20 LAB — CBC
HCT: 41.8 % (ref 39.0–52.0)
Hemoglobin: 13 g/dL (ref 13.0–17.0)
MCH: 30.4 pg (ref 26.0–34.0)
MCHC: 31.1 g/dL (ref 30.0–36.0)
MCV: 97.7 fL (ref 80.0–100.0)
Platelets: 224 10*3/uL (ref 150–400)
RBC: 4.28 MIL/uL (ref 4.22–5.81)
RDW: 13.7 % (ref 11.5–15.5)
WBC: 7 10*3/uL (ref 4.0–10.5)
nRBC: 0 % (ref 0.0–0.2)

## 2023-06-20 LAB — TYPE AND SCREEN
ABO/RH(D): B POS
Antibody Screen: NEGATIVE

## 2023-06-20 LAB — PROTIME-INR
INR: 1 (ref 0.8–1.2)
Prothrombin Time: 13.8 s (ref 11.4–15.2)

## 2023-06-20 LAB — APTT: aPTT: 32 s (ref 24–36)

## 2023-06-20 NOTE — Telephone Encounter (Signed)
Spent 17 minutes on the phone with patient. Explained the typical scheduling process.  He thought the pre op nurse this morning should have told him about this. Aware I will have the EP scheduler call him next week to see about rescheduling him to 3/7 or 3/12 when he has appts in [place]. Pt appreciates the attempt/help and agreeable to plan

## 2023-06-20 NOTE — Progress Notes (Signed)
PCP - Dr. Juleen China Cardiologist - Dr. Marca Ancona EP Cardiologist: Dr. Loman Brooklyn  PPM/ICD - ICD, Medtronic Device Orders - received Rep Notified - Shari Prows  Chest x-ray - 10/05/2021 EKG - 04/24/2023 Stress Test - 02/25/2023 ECHO - 06/11/2022 Cardiac Cath - 03/18/2023  Sleep Study - denies CPAP - na  Non-diabetic  Last dose of GLP2 : Farxiga, hold 72 hours with the last dose being 06/22/2023  Blood Thinner Instructions: Plavix, hold 5 days prior to surgery with the last day being 06/19/2023 Aspirin Instructions:denies  ERAS Protcol - NPO  Anesthesia review: Yes. PVD, CHF, CAD, HTN, MI, CKD  Patient denies shortness of breath, fever, cough and chest pain at PAT appointment   All instructions explained to the patient, with a verbal understanding of the material. Patient agrees to go over the instructions while at home for a better understanding. Patient also instructed to self quarantine after being tested for COVID-19. The opportunity to ask questions was provided.

## 2023-06-23 ENCOUNTER — Other Ambulatory Visit (HOSPITAL_COMMUNITY): Payer: Self-pay | Admitting: Cardiology

## 2023-06-23 MED ORDER — DAPAGLIFLOZIN PROPANEDIOL 10 MG PO TABS: 10.0000 mg | ORAL_TABLET | Freq: Every day | ORAL | 3 refills | Status: DC

## 2023-06-23 NOTE — Progress Notes (Signed)
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  Last seen by Prince Rome, FNP on 06/06/2023.  Stable from cardiac standpoint.  Per note, "Echo 2/24 showed EF 25-30% with anterolateral/inferolateral akinesis, basal-mid inferior akinesis, mild RV dysfunction, mild-moderate MR.  CPX in 10/24 was submaximal but suggestive of severe HF limitation.  RHC In 11/24 showed normal filling pressures with low, but not markedly low, CI (2.11 Fick, 2.35 thermo). Chronic NYHA class III symptoms, slowly worsening with prominent fatigue.  Pulmonary fibrosis plays a role in his symptoms as well.  Today, he is not volume overloaded on exam or by OptiVol. CKD limits medication titration."  No changes made to management, discussed upcoming Barostim placement.  Follows with pulmonologist Dr. Isaiah Serge for history of IPF.  He was offered antifibrotic treatment but wanted to hold off.  PFTs 07/23/2021: FVC 3.47 [82%], FEV1 2.72 [89%], F/F 78, TLC 5.60 [79%], DLCO 12.15 [48%] Minimal restriction moderate-severe diffusion defect  Last seen by Dr. Isaiah Serge 07/25/2022, pulmonary fibrosis was felt stable at that time.  Recommended 1 year follow-up with HRCT.  Other pertinent history includes CKD 3, rheumatoid arthritis on leflunomide.  Preop labs reviewed, creatinine elevated at 1.98 consistent with history of CKD 3, otherwise unremarkable.  EKG 04/24/2023: AV dual paced rhythm.  Rate 64.  Perioperative prescription for implanted cardiac device programming per progress note 06/19/2023: Device Information:   Clinic EP Physician:  Loman Brooklyn, MD    Device Type:   Defibrillator Manufacturer and Phone #:  Medtronic: [phone] Pacemaker Dependent?:  Yes.   Date of Last Device Check:  02/03/2023 IN OFFICE            Normal Device Function?:  Yes.     Electrophysiologist's Recommendations:   Have magnet available. Provide continuous ECG monitoring when magnet is used or reprogramming is to be performed.  Procedure will likely interfere with device function.  Device should be programmed:  Tachy therapies disabled and Asynchronous pacing during procedure and returned to normal programming after procedure (DEFER TO Industry presence and guidance during procedure.)   HRCT chest 03/07/2020: IMPRESSION: 1. The appearance of the lungs is compatible with interstitial lung disease, with a spectrum of findings categorized as probable usual interstitial pneumonia (UIP) per current ATS guidelines. 2. Aortic atherosclerosis, in addition to left main and 3 vessel coronary artery disease. Assessment for potential risk factor modification, dietary therapy or pharmacologic therapy may be warranted, if clinically indicated. 3. There are calcifications of the aortic valve and mitral annulus. Echocardiographic correlation for evaluation of potential valvular dysfunction may be warranted if clinically indicated.  Right heart cath 03/18/2023: 1. Normal filling pressures.  2. Normal PA pressure 3. Low but not markedly low cardiac output.    Continue barostimulator evaluation with Dr. Myra Gianotti.   Carotid duplex 02/23/2023: Summary:  Right Carotid: Velocities in the right ICA are consistent with a 60-79%  stenosis.  Left Carotid: Velocities in the left ICA are consistent with a 60-79% stenosis.  Vertebrals: Bilateral vertebral arteries demonstrate antegrade flow.  Subclavians: Normal flow hemodynamics were seen in bilateral subclavian  arteries.   TTE 06/11/2022: 1. Left ventricular ejection fraction, by estimation,  is 25 to 30%. The  left ventricle has severely  decreased function. The left ventricle  demonstrates regional wall motion abnormalities with inferolateral and  anterolateral akinesis, basal to mid  inferior akinesis. The left ventricular internal cavity size was  moderately dilated. There is mild concentric left ventricular hypertrophy.  Left ventricular diastolic parameters are consistent with Grade I  diastolic dysfunction (impaired relaxation).   2. Right ventricular systolic function is mildly reduced. The right  ventricular size is normal. There is normal pulmonary artery systolic  pressure. The estimated right ventricular systolic pressure is 34.8 mmHg.   3. Left atrial size was moderately dilated.   4. The mitral valve is normal in structure. Mild to moderate mitral valve  regurgitation. No evidence of mitral stenosis.   5. The aortic valve is tricuspid. There is mild calcification of the  aortic valve. Aortic valve regurgitation is mild. No aortic stenosis is  present.   6. The inferior vena cava is normal in size with greater than 50%  respiratory variability, suggesting right atrial pressure of 3 mmHg.      Zannie Cove [facility]/Anesthesiology Phone [phone] 06/23/2023 1:14 PM

## 2023-06-23 NOTE — Anesthesia Preprocedure Evaluation (Signed)
Anesthesia Evaluation  Patient identified by MRN, date of birth, ID band Patient awake    Reviewed: Allergy & Precautions, H&P , NPO status , Patient's Chart, lab work & pertinent test results  Airway Mallampati: II  TM Distance: >3 FB Neck ROM: Full    Dental no notable dental hx.    Pulmonary former smoker Pulmonary fibrosis   Pulmonary exam normal breath sounds clear to auscultation       Cardiovascular hypertension, + CAD, + Past MI, + Peripheral Vascular Disease and +CHF  Normal cardiovascular exam+ dysrhythmias Atrial Fibrillation + Cardiac Defibrillator  Rhythm:Regular Rate:Normal  IMPRESSIONS     1. Left ventricular ejection fraction, by estimation, is 25 to 30%. The  left ventricle has severely decreased function. The left ventricle  demonstrates regional wall motion abnormalities with inferolateral and  anterolateral akinesis, basal to mid  inferior akinesis. The left ventricular internal cavity size was  moderately dilated. There is mild concentric left ventricular hypertrophy.  Left ventricular diastolic parameters are consistent with Grade I  diastolic dysfunction (impaired relaxation).   2. Right ventricular systolic function is mildly reduced. The right  ventricular size is normal. There is normal pulmonary artery systolic  pressure. The estimated right ventricular systolic pressure is 34.8 mmHg.   3. Left atrial size was moderately dilated.   4. The mitral valve is normal in structure. Mild to moderate mitral valve  regurgitation. No evidence of mitral stenosis.   5. The aortic valve is tricuspid. There is mild calcification of the  aortic valve. Aortic valve regurgitation is mild. No aortic stenosis is  present.   6. The inferior vena cava is normal in size with greater than 50%  respiratory variability, suggesting right atrial pressure of 3 mmHg.     Neuro/Psych  Headaches PSYCHIATRIC DISORDERS Anxiety      Cervical radiculopathy    GI/Hepatic negative GI ROS, Neg liver ROS,,,  Endo/Other  negative endocrine ROS    Renal/GU CRFRenal disease  negative genitourinary   Musculoskeletal  (+) Arthritis ,    Abdominal   Peds negative pediatric ROS (+)  Hematology negative hematology ROS (+)   Anesthesia Other Findings   Reproductive/Obstetrics negative OB ROS                              Anesthesia Physical Anesthesia Plan  ASA: 4  Anesthesia Plan: General   Post-op Pain Management:    Induction: Intravenous  PONV Risk Score and Plan: 2 and Ondansetron and Dexamethasone  Airway Management Planned: Oral ETT  Additional Equipment: Arterial line  Intra-op Plan:   Post-operative Plan: Extubation in OR  Informed Consent: I have reviewed the patients History and Physical, chart, labs and discussed the procedure including the risks, benefits and alternatives for the proposed anesthesia with the patient or authorized representative who has indicated his/her understanding and acceptance.     Dental advisory given  Plan Discussed with: CRNA  Anesthesia Plan Comments: (PAT note by Antionette Poles, PA-C: 78 year old male follows with advanced heart failure clinic for history of chronic systolic CHF secondary to ischemic cardiomyopathy s/p Medtronic CRT-D device (EF 25 to 30% by echo 06/2022), CAD s/p remote silent inferior MI and subsequent DES x 3 to ramus in 07/2016 and DES to OM1 in 01/2020, history of VT maintained on amiodarone (also on Levoxyl for amiodarone induced mild hypothyroidism), PAD.  Last seen by Prince Rome, FNP on 06/06/2023.  Stable from cardiac standpoint.  Per note, "Echo 2/24 showed EF 25-30% with anterolateral/inferolateral akinesis, basal-mid inferior akinesis, mild RV dysfunction, mild-moderate MR.  CPX in 10/24 was submaximal but suggestive of severe HF limitation.  RHC In 11/24 showed normal filling pressures with low, but not  markedly low, CI (2.11 Fick, 2.35 thermo). Chronic NYHA class III symptoms, slowly worsening with prominent fatigue.  Pulmonary fibrosis plays a role in his symptoms as well.  Today, he is not volume overloaded on exam or by OptiVol. CKD limits medication titration."  No changes made to management, discussed upcoming Barostim placement.  Follows with pulmonologist Dr. Isaiah Serge for history of IPF.  He was offered antifibrotic treatment but wanted to hold off.  PFTs 07/23/2021: FVC 3.47 [82%], FEV1 2.72 [89%], F/F 78, TLC 5.60 [79%], DLCO 12.15 [48%] Minimal restriction moderate-severe diffusion defect  Last seen by Dr. Isaiah Serge 07/25/2022, pulmonary fibrosis was felt stable at that time.  Recommended 1 year follow-up with HRCT.  Other pertinent history includes CKD 3, rheumatoid arthritis on leflunomide.  Preop labs reviewed, creatinine elevated at 1.98 consistent with history of CKD 3, otherwise unremarkable.  EKG 04/24/2023: AV dual paced rhythm.  Rate 64.  Perioperative prescription for implanted cardiac device programming per progress note 06/19/2023: Device Information:   Clinic EP Physician:  Loman Brooklyn, MD    Device Type:  Defibrillator Manufacturer and Phone #:  Medtronic: [phone] Pacemaker Dependent?:  Yes.   Date of Last Device Check:  02/03/2023 IN OFFICE            Normal Device Function?:  Yes.     Electrophysiologist's Recommendations:    Have magnet available.  Provide continuous ECG monitoring when magnet is used or reprogramming is to be performed.   Procedure will likely interfere with device function.  Device should be programmed:  Tachy therapies disabled and Asynchronous pacing during procedure and returned to normal programming after procedure (DEFER TO Industry presence and guidance during procedure.)   HRCT chest 03/07/2020: IMPRESSION: 1. The appearance of the lungs is compatible with interstitial lung disease, with a spectrum of findings categorized as  probable usual interstitial pneumonia (UIP) per current ATS guidelines. 2. Aortic atherosclerosis, in addition to left main and 3 vessel coronary artery disease. Assessment for potential risk factor modification, dietary therapy or pharmacologic therapy may be warranted, if clinically indicated. 3. There are calcifications of the aortic valve and mitral annulus. Echocardiographic correlation for evaluation of potential valvular dysfunction may be warranted if clinically indicated.  Right heart cath 03/18/2023: 1. Normal filling pressures.  2. Normal PA pressure 3. Low but not markedly low cardiac output.    Continue barostimulator evaluation with Dr. Myra Gianotti.   Carotid duplex 02/23/2023: Summary:  Right Carotid: Velocities in the right ICA are consistent with a 60-79%  stenosis.  Left Carotid: Velocities in the left ICA are consistent with a 60-79% stenosis.  Vertebrals: Bilateral vertebral arteries demonstrate antegrade flow.  Subclavians: Normal flow hemodynamics were seen in bilateral subclavian  arteries.   TTE 06/11/2022: 1. Left ventricular ejection fraction, by estimation, is 25 to 30%. The  left ventricle has severely decreased function. The left ventricle  demonstrates regional wall motion abnormalities with inferolateral and  anterolateral akinesis, basal to mid  inferior akinesis. The left ventricular internal cavity size was  moderately dilated. There is mild concentric left ventricular hypertrophy.  Left ventricular diastolic parameters are consistent with Grade I  diastolic dysfunction (impaired relaxation).   2. Right ventricular systolic function is mildly reduced. The right  ventricular size  is normal. There is normal pulmonary artery systolic  pressure. The estimated right ventricular systolic pressure is 34.8 mmHg.   3. Left atrial size was moderately dilated.   4. The mitral valve is normal in structure. Mild to moderate mitral valve  regurgitation. No evidence  of mitral stenosis.   5. The aortic valve is tricuspid. There is mild calcification of the  aortic valve. Aortic valve regurgitation is mild. No aortic stenosis is  present.   6. The inferior vena cava is normal in size with greater than 50%  respiratory variability, suggesting right atrial pressure of 3 mmHg.    )         Anesthesia Quick Evaluation

## 2023-06-24 ENCOUNTER — Encounter: Payer: Self-pay | Admitting: Pulmonary Disease

## 2023-06-24 ENCOUNTER — Encounter: Payer: Self-pay | Admitting: Surgery

## 2023-06-24 ENCOUNTER — Telehealth: Payer: Self-pay | Admitting: Pulmonary Disease

## 2023-06-24 NOTE — Telephone Encounter (Signed)
PT would like CT done @ [facility] and first thing in the AM.

## 2023-06-25 ENCOUNTER — Ambulatory Visit (HOSPITAL_BASED_OUTPATIENT_CLINIC_OR_DEPARTMENT_OTHER): Payer: Medicare Other | Admitting: Registered Nurse

## 2023-06-25 ENCOUNTER — Ambulatory Visit (HOSPITAL_COMMUNITY)
Admission: RE | Admit: 2023-06-25 | Discharge: 2023-06-25 | Disposition: A | Discharge status: Home / Self Care | Payer: Medicare Other | Attending: Surgery | Admitting: Surgery

## 2023-06-25 ENCOUNTER — Other Ambulatory Visit (HOSPITAL_COMMUNITY): Payer: Self-pay

## 2023-06-25 ENCOUNTER — Ambulatory Visit (HOSPITAL_COMMUNITY): Payer: Medicare Other | Admitting: Physician Assistant

## 2023-06-25 ENCOUNTER — Encounter (HOSPITAL_COMMUNITY)
Admission: RE | Disposition: A | Discharge status: Home / Self Care | Payer: Self-pay | Source: Home / Self Care | Attending: Surgery

## 2023-06-25 ENCOUNTER — Encounter (HOSPITAL_COMMUNITY): Payer: Self-pay | Admitting: Surgery

## 2023-06-25 ENCOUNTER — Other Ambulatory Visit: Payer: Self-pay

## 2023-06-25 DIAGNOSIS — I252 Old myocardial infarction: Secondary | ICD-10-CM | POA: Diagnosis not present

## 2023-06-25 DIAGNOSIS — E7800 Pure hypercholesterolemia, unspecified: Secondary | ICD-10-CM | POA: Insufficient documentation

## 2023-06-25 DIAGNOSIS — Z95810 Presence of automatic (implantable) cardiac defibrillator: Secondary | ICD-10-CM | POA: Insufficient documentation

## 2023-06-25 DIAGNOSIS — I130 Hypertensive heart and chronic kidney disease with heart failure and stage 1 through stage 4 chronic kidney disease, or unspecified chronic kidney disease: Secondary | ICD-10-CM | POA: Insufficient documentation

## 2023-06-25 DIAGNOSIS — I5022 Chronic systolic (congestive) heart failure: Secondary | ICD-10-CM

## 2023-06-25 DIAGNOSIS — Z955 Presence of coronary angioplasty implant and graft: Secondary | ICD-10-CM | POA: Diagnosis not present

## 2023-06-25 DIAGNOSIS — J8410 Pulmonary fibrosis, unspecified: Secondary | ICD-10-CM | POA: Insufficient documentation

## 2023-06-25 DIAGNOSIS — I110 Hypertensive heart disease with heart failure: Secondary | ICD-10-CM

## 2023-06-25 DIAGNOSIS — I4891 Unspecified atrial fibrillation: Secondary | ICD-10-CM | POA: Diagnosis not present

## 2023-06-25 DIAGNOSIS — Z7984 Long term (current) use of oral hypoglycemic drugs: Secondary | ICD-10-CM | POA: Insufficient documentation

## 2023-06-25 DIAGNOSIS — I2510 Atherosclerotic heart disease of native coronary artery without angina pectoris: Secondary | ICD-10-CM | POA: Insufficient documentation

## 2023-06-25 DIAGNOSIS — N183 Chronic kidney disease, stage 3 unspecified: Secondary | ICD-10-CM | POA: Insufficient documentation

## 2023-06-25 SURGERY — INSERTION, CAROTID SINUS BAROREFLEX ACTIVATION DEVICE
Anesthesia: General | Site: Chest | Laterality: Right

## 2023-06-25 MED ORDER — DEXAMETHASONE SODIUM PHOSPHATE 10 MG/ML IJ SOLN
INTRAMUSCULAR | Status: AC
  Filled 2023-06-25: qty 1

## 2023-06-25 MED ORDER — SODIUM CHLORIDE 0.9 % IV SOLN
INTRAVENOUS | Status: DC | PRN
  Administered 2023-06-25: 500 mL

## 2023-06-25 MED ORDER — ROCURONIUM BROMIDE 10 MG/ML (PF) SYRINGE
PREFILLED_SYRINGE | INTRAVENOUS | Status: AC
  Filled 2023-06-25: qty 10

## 2023-06-25 MED ORDER — CEFAZOLIN SODIUM-DEXTROSE 2-4 GM/100ML-% IV SOLN
2.0000 g | INTRAVENOUS | Status: AC
  Administered 2023-06-25: 2 g via INTRAVENOUS
  Filled 2023-06-25: qty 100

## 2023-06-25 MED ORDER — CHLORHEXIDINE GLUCONATE 0.12 % MT SOLN
15.0000 mL | Freq: Once | OROMUCOSAL | Status: AC
  Administered 2023-06-25: 15 mL via OROMUCOSAL
  Filled 2023-06-25: qty 15

## 2023-06-25 MED ORDER — SODIUM CHLORIDE 0.9 % IV SOLN
INTRAVENOUS | Status: DC
Start: 2023-06-25 — End: 2023-06-25

## 2023-06-25 MED ORDER — LIDOCAINE HCL (PF) 1 % IJ SOLN
INTRAMUSCULAR | Status: AC
  Filled 2023-06-25: qty 30

## 2023-06-25 MED ORDER — PROPOFOL 10 MG/ML IV BOLUS
INTRAVENOUS | Status: DC | PRN
  Administered 2023-06-25 (×2): 20 mg via INTRAVENOUS
  Administered 2023-06-25: 30 mg via INTRAVENOUS

## 2023-06-25 MED ORDER — ONDANSETRON HCL 4 MG/2ML IJ SOLN
INTRAMUSCULAR | Status: AC
  Filled 2023-06-25: qty 2

## 2023-06-25 MED ORDER — PROPOFOL 10 MG/ML IV BOLUS
INTRAVENOUS | Status: AC
  Filled 2023-06-25: qty 20

## 2023-06-25 MED ORDER — ONDANSETRON HCL 4 MG/2ML IJ SOLN
INTRAMUSCULAR | Status: DC | PRN
  Administered 2023-06-25: 4 mg via INTRAVENOUS

## 2023-06-25 MED ORDER — ROCURONIUM BROMIDE 10 MG/ML (PF) SYRINGE
PREFILLED_SYRINGE | INTRAVENOUS | Status: DC | PRN
  Administered 2023-06-25: 50 mg via INTRAVENOUS
  Administered 2023-06-25: 20 mg via INTRAVENOUS

## 2023-06-25 MED ORDER — TRAMADOL HCL 50 MG PO TABS
50.0000 mg | ORAL_TABLET | Freq: Two times a day (BID) | ORAL | 0 refills | Status: DC | PRN
  Filled 2023-06-25: qty 6, 3d supply, fill #0

## 2023-06-25 MED ORDER — ORAL CARE MOUTH RINSE: 15.0000 mL | Freq: Once | OROMUCOSAL | Status: AC

## 2023-06-25 MED ORDER — STERILE WATER FOR IRRIGATION IR SOLN
Status: DC | PRN
  Administered 2023-06-25: 1000 mL

## 2023-06-25 MED ORDER — LIDOCAINE 2% (20 MG/ML) 5 ML SYRINGE
INTRAMUSCULAR | Status: DC | PRN
  Administered 2023-06-25: 100 mg via INTRAVENOUS

## 2023-06-25 MED ORDER — PHENYLEPHRINE HCL-NACL 20-0.9 MG/250ML-% IV SOLN
INTRAVENOUS | Status: DC | PRN
  Administered 2023-06-25: 40 ug/min via INTRAVENOUS

## 2023-06-25 MED ORDER — FENTANYL CITRATE (PF) 250 MCG/5ML IJ SOLN
INTRAMUSCULAR | Status: AC
  Filled 2023-06-25: qty 5

## 2023-06-25 MED ORDER — LIDOCAINE 2% (20 MG/ML) 5 ML SYRINGE
INTRAMUSCULAR | Status: AC
  Filled 2023-06-25: qty 5

## 2023-06-25 MED ORDER — DEXAMETHASONE SODIUM PHOSPHATE 10 MG/ML IJ SOLN
INTRAMUSCULAR | Status: DC | PRN
  Administered 2023-06-25: 5 mg via INTRAVENOUS

## 2023-06-25 MED ORDER — CHLORHEXIDINE GLUCONATE CLOTH 2 % EX PADS
6.0000 | MEDICATED_PAD | Freq: Once | CUTANEOUS | Status: DC
Start: 2023-06-25 — End: 2023-06-25

## 2023-06-25 MED ORDER — SUGAMMADEX SODIUM 200 MG/2ML IV SOLN
INTRAVENOUS | Status: DC | PRN
  Administered 2023-06-25: 200 mg via INTRAVENOUS

## 2023-06-25 MED ORDER — SUGAMMADEX SODIUM 200 MG/2ML IV SOLN
INTRAVENOUS | Status: AC
  Filled 2023-06-25: qty 2

## 2023-06-25 MED ORDER — FENTANYL CITRATE (PF) 250 MCG/5ML IJ SOLN
INTRAMUSCULAR | Status: DC | PRN
  Administered 2023-06-25 (×2): 50 ug via INTRAVENOUS

## 2023-06-25 MED ORDER — SODIUM CHLORIDE 0.9 % IV SOLN
0.1500 ug/kg/min | INTRAVENOUS | Status: AC
  Administered 2023-06-25: .15 ug/kg/min via INTRAVENOUS
  Filled 2023-06-25: qty 2000

## 2023-06-25 MED ORDER — PHENYLEPHRINE 80 MCG/ML (10ML) SYRINGE FOR IV PUSH (FOR BLOOD PRESSURE SUPPORT)
PREFILLED_SYRINGE | INTRAVENOUS | Status: DC | PRN
  Administered 2023-06-25 (×3): 80 ug via INTRAVENOUS

## 2023-06-25 SURGICAL SUPPLY — 34 items
BAG COUNTER SPONGE SURGICOUNT (BAG) ×1 IMPLANT
CANISTER SUCT 3000ML PPV (MISCELLANEOUS) ×1 IMPLANT
CHLORAPREP W/TINT 26 (MISCELLANEOUS) ×1 IMPLANT
CLIP TI MEDIUM 6 (CLIP) IMPLANT
CLIP TI WIDE RED SMALL 6 (CLIP) IMPLANT
COVER PROBE W GEL 5X96 (DRAPES) ×1 IMPLANT
DERMABOND ADVANCED .7 DNX12 (GAUZE/BANDAGES/DRESSINGS) ×1 IMPLANT
ELECT REM PT RETURN 9FT ADLT (ELECTROSURGICAL) ×1
ELECTRODE REM PT RTRN 9FT ADLT (ELECTROSURGICAL) ×1 IMPLANT
GENERATOR IPG BAROSTIM 2104 (Generator) IMPLANT
GLOVE SURG SS PI 7.5 STRL IVOR (GLOVE) ×1 IMPLANT
GOWN STRL REUS W/ TWL LRG LVL3 (GOWN DISPOSABLE) ×2 IMPLANT
GOWN STRL REUS W/ TWL XL LVL3 (GOWN DISPOSABLE) ×1 IMPLANT
HEMOSTAT SNOW SURGICEL 2X4 (HEMOSTASIS) IMPLANT
KIT BASIN OR (CUSTOM PROCEDURE TRAY) ×1 IMPLANT
KIT TURNOVER KIT B (KITS) ×1 IMPLANT
LEAD CAROTID BAROSTIM 1036 (Lead) IMPLANT
MARKER SKIN DUAL TIP RULER LAB (MISCELLANEOUS) ×1 IMPLANT
NDL 18GX1X1/2 (RX/OR ONLY) (NEEDLE) ×1 IMPLANT
NEEDLE 18GX1X1/2 (RX/OR ONLY) (NEEDLE) ×1 IMPLANT
NS IRRIG 1000ML POUR BTL (IV SOLUTION) ×2 IMPLANT
PACK CAROTID (CUSTOM PROCEDURE TRAY) ×1 IMPLANT
PAD ARMBOARD 7.5X6 YLW CONV (MISCELLANEOUS) ×2 IMPLANT
POSITIONER HEAD DONUT 9IN (MISCELLANEOUS) ×1 IMPLANT
SUT ETHIBOND CT1 BRD #0 30IN (SUTURE) ×2 IMPLANT
SUT ETHILON 3 0 PS 1 (SUTURE) IMPLANT
SUT PROLENE 6 0 BV (SUTURE) ×8 IMPLANT
SUT SILK 0 FSL (SUTURE) IMPLANT
SUT VIC AB 3-0 SH 27X BRD (SUTURE) ×2 IMPLANT
SUT VICRYL 4-0 PS2 18IN ABS (SUTURE) ×2 IMPLANT
SYR 5ML LL (SYRINGE) ×1 IMPLANT
SYR BULB IRRIG 60ML STRL (SYRINGE) ×1 IMPLANT
TOWEL GREEN STERILE (TOWEL DISPOSABLE) ×1 IMPLANT
WATER STERILE IRR 1000ML POUR (IV SOLUTION) ×1 IMPLANT

## 2023-06-25 NOTE — Anesthesia Postprocedure Evaluation (Signed)
Anesthesia Post Note  Patient: MAKENZIE VITTORIO  Procedure(s) Performed: INSERTION OF RIGHT BAROSTIM (Right: Chest)     Patient location during evaluation: PACU Anesthesia Type: General Level of consciousness: awake and alert Pain management: pain level controlled Vital Signs Assessment: post-procedure vital signs reviewed and stable Respiratory status: spontaneous breathing, nonlabored ventilation, respiratory function stable and patient connected to nasal cannula oxygen Cardiovascular status: blood pressure returned to baseline and stable Postop Assessment: no apparent nausea or vomiting Anesthetic complications: no   There were no known notable events for this encounter.  Last Vitals:  Vitals:   06/25/23 1230 06/25/23 1245  BP: (!) 142/71 135/70  Pulse: 62 60  Resp: 19 16  Temp:  36.6 C  SpO2: 99% 98%    Last Pain:  Vitals:   06/25/23 1245  TempSrc:   PainSc: 0-No pain                 [place]

## 2023-06-25 NOTE — H&P (Signed)
[facility]   Patient name: Nathan Pruitt         MRN: [id number]        DOB: Sep 24, 1945          Sex: male     REASON FOR VISIT:      Barostim evaluation   HISOTRY OF PRESENT ILLNESS:      Nathan Pruitt is a 78 y.o. male who is a patient of Dr. Edilia Bo, status post femoral-femoral bypass graft in 2009.  He was last seen in [place] clinic in February 2024.  His bypass graft was widely patent.  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   The patient is referred for evaluation for a Barostim implant.  He suffers from chronic systolic congestive heart failure.  His most recent echo was 30 to 35% he has NYHA class III symptoms that are getting worse.  The most significant is his fatigue.  He has not a good LVAD candidate because of his CKD and pulmonary fibrosis.  He does have a history of coronary artery disease, status post PCI and 2018 and 2021.  He is medically managed for hypercholesterolemia with a statin.  He has stage III chronic renal insufficiency.  He has a ICD in place. PAST MEDICAL HISTORY:        Past Medical History:  Diagnosis Date   Abnormal PFTs 11/2009    FVC 74%, FEV1 80%, ratio 75%, TLC 78%, DLCO 68%. this suggests a mild restrictive and obstructive defect. pt did have a response to bronchodilator. these PFTs were significantly better than the report from Dr. Blenda Nicely in [place] done prior.    AICD (automatic cardioverter/defibrillator) present 2010   Anxiety     Atrial flutter (HCC)      s/p isthmus ablation 5/10   CAD (coronary artery disease)      hx of silent MI in 1993. likely an inferior MI. hx of 2D cardiogram in 3/09 showing EF of 40%. hx of myoview in [place] 3/09-nml. presented to [facility] 5/10 with VT and mildly elevated cardiac enzymes LHC (5/10): inferobasal dyskinesis with EF 35-40%. was chronic total occlusion of mid RCA with good collaterals. luminals LCA. this does not appear to be an acute cause of the 5/10 event    Cancer (HCC)      skin   Cervical radiculopathy     CHF (congestive heart failure) (HCC)     Chronic lower back pain     CKD (chronic kidney disease)     HLD (hyperlipidemia)     HTN (hypertension)     Ischemic cardiomyopathy      EF 35-40% by LV-gram 5/10 with inferobasal dyskinesis. echo 5/10 showed EF 40% w/mild LVH, no sig. MR, inferobasal and posterobasal akinesis. echo (7/11): EF 50%, mild LVH, basal-mid inferoposterior akenesis   Migraine      "visual migraine; maybe 12/year" (07/16/2016)   PAD (peripheral artery disease) (HCC)      s/p L-to-R fem-fem bypass performed by Dr. Edilia Bo at [facility] in 2009    Rheumatoid arthritis [facility])      on leflunomide   Silent myocardial infarction [facility]) 1993    "silent"   Tobacco abuse      47 pack year hx; quit 8/09   Ventricular tachycardia (HCC)      likely scar-mediated. 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 He has duel chamber Medtronic ICD            FAMILY  HISTORY:         Family History  Problem Relation Age of Onset   Gastric cancer Mother     Throat cancer Father     Peripheral vascular disease Father     Heart attack Brother 85          SOCIAL HISTORY:    Social History         Tobacco Use   Smoking status: Former      Current packs/day: 0.00      Average packs/day: 1 pack/day for 47.0 years (47.0 ttl pk-yrs)      Types: Cigarettes      Start date: 63      Quit date: 2009      Years since quitting: 15.8      Passive exposure: Never   Smokeless tobacco: Never  Substance Use Topics   Alcohol use: Yes      Comment: 07/15/2016 "nothing for the last couple years" 04/18/21 very seldom        ALLERGIES:    Allergies       Allergies  Allergen Reactions   Atorvastatin Other (See Comments)      Muscle pain   Crestor [Rosuvastatin Calcium] Other (See Comments)      Muscle pain   Enalapril Other (See Comments)      Upset stomach   Lisinopril Cough   Losartan Other (See Comments)       Muscle pain    Telmisartan Other (See Comments)      Stomach ache          CURRENT MEDICATIONS:          Current Outpatient Medications  Medication Sig Dispense Refill   acetaminophen (TYLENOL) 500 MG tablet Take 500-1,000 mg by mouth every 6 (six) hours as needed (for pain.).       amiodarone (PACERONE) 200 MG tablet Take 1/2 (one-half) tablet by mouth once daily 45 tablet 3   carvedilol (COREG) 12.5 MG tablet Take 1 tablet by mouth twice daily 180 tablet 0   chlorhexidine (PERIDEX) 0.12 % solution Use as directed 15 mLs in the mouth or throat daily as needed (irritation).    3   clopidogrel (PLAVIX) 75 MG tablet Take 1 tablet by mouth once daily with breakfast 90 tablet 0   dapagliflozin propanediol (FARXIGA) 10 MG TABS tablet Take 1 tablet (10 mg total) by mouth daily before breakfast.       eplerenone (INSPRA) 25 MG tablet Take 1 tablet by mouth once daily 90 tablet 0   fluticasone (FLONASE) 50 MCG/ACT nasal spray Place 1-2 sprays into both nostrils daily as needed for allergies or rhinitis.       furosemide (LASIX) 40 MG tablet Take 1 tablet by mouth once daily 90 tablet 0   leflunomide (ARAVA) 20 MG tablet Take 20 mg by mouth daily.       levothyroxine (SYNTHROID) 25 MCG tablet TAKE 1 TABLET BY MOUTH ONCE DAILY BEFORE BREAKFAST 90 tablet 3   Melatonin 10 MG TABS Take 10 mg by mouth at bedtime as needed (sleep).        simvastatin (ZOCOR) 20 MG tablet TAKE 1 TABLET BY MOUTH EVERYDAY AT BEDTIME 90 tablet 3      No current facility-administered medications for this visit.        REVIEW OF SYSTEMS:    [X]  denotes positive finding, [ ]  denotes negative finding Cardiac   Comments:  Chest pain or chest pressure:  Shortness of breath upon exertion:      Short of breath when lying flat:      Irregular heart rhythm:             Vascular      Pain in calf, thigh, or hip brought on by ambulation:      Pain in feet at night that wakes you up from your sleep:       Blood clot  in your veins:      Leg swelling:              Pulmonary      Oxygen at home:      Productive cough:       Wheezing:              Neurologic      Sudden weakness in arms or legs:       Sudden numbness in arms or legs:       Sudden onset of difficulty speaking or slurred speech:      Temporary loss of vision in one eye:       Problems with dizziness:              Gastrointestinal      Blood in stool:       Vomited blood:              Genitourinary      Burning when urinating:       Blood in urine:             Psychiatric      Major depression:              Hematologic      Bleeding problems:      Problems with blood clotting too easily:             Skin      Rashes or ulcers:             Constitutional      Fever or chills:          PHYSICAL EXAM:       Vitals:    03/03/23 0818  BP: 127/78  Pulse: 67  Resp: 20  Temp: 97.9 F (36.6 C)  SpO2: 96%  Weight: 143 lb 9.6 oz (65.1 kg)  Height: 5\' 10"  (1.778 m)      GENERAL: The patient is a well-nourished male, in no acute distress. The vital signs are documented above. CARDIAC: There is a regular rate and rhythm.  VASCULAR: SonoSite was used to evaluate his carotid bifurcation which is high on the right side at the level of mandible PULMONARY: Non-labored respirations MUSCULOSKELETAL: There are no major deformities or cyanosis. NEUROLOGIC: No focal weakness or paresthesias are detected. SKIN: There are no ulcers or rashes noted. PSYCHIATRIC: The patient has a normal affect.   STUDIES:    I have reviewed the following carotid duplex:   Right Carotid: Velocities in the right ICA are consistent with a 60-79%                 stenosis.   Left Carotid: Velocities in the left ICA are consistent with a 60-79%  stenosis.   Vertebrals: Bilateral vertebral arteries demonstrate antegrade flow.  Subclavians: Normal flow hemodynamics were seen in bilateral subclavian               arteries.    MEDICAL ISSUES:     NYHA class III heart failure:  The patient has turndown an LVAD.  His biggest quality-of-life issues are with shortness of breath and fatigue.  He is sent over for evaluation of Barostim therapy.  We discussed dealing with placing the device.  We talked about the expectations for device benefit including improvement in his fatigue and shortness of breath.  He is very interested in doing this.  He is scheduled to have a right heart catheterization in the next few weeks.  I told him that the 1 issue with the device implant would be that he has 60-79% bilateral carotid stenosis.  When I looked at the artery myself with ultrasound, this does not appear to be calcified disease.  I considered getting a CT angiogram to better evaluate this because his diastolic velocities on ultrasound suggested that this is at the very low range of the stenosis category, however with his underlying renal disease I did not think that that was advisable.  He is asymptomatic from his carotid perspective therefore I think it is reasonable to proceed.  I have spoken with the company about this and although the initial enrollment trials did not include patients with carotid stenosis, anecdotally this has been done around the country without any significant challenges.  Therefore, I think it is reasonable to proceed with device implant.  The patient understands all this and wishes to proceed.       Charlena Cross, MD, FACS [facility] [phone] Pager [phone]

## 2023-06-25 NOTE — Telephone Encounter (Signed)
Faxed to [facility]. 

## 2023-06-25 NOTE — Op Note (Signed)
    Patient name: Nathan Pruitt MRN: [id number] DOB: [birth year] Sex: male  06/25/2023 Pre-operative Diagnosis: CHF Post-operative diagnosis:  Same Surgeon:  Durene Cal, MD Assistants:  Carolynn Sayers, MD Procedure:   Right Barostim Implant Anesthesia:  General Blood Loss:  minimal Specimens:  none  Findings:  Device at position "A" Device serial number: [id number] model number 06/06/2002 Lead serial #[id number] model #1036  Indications: This is a 78 year old gentleman with refractory heart failure and persistent symptoms despite goal-directed medical therapy.  He has been approved for Barostim implant.  He comes in today for his procedure.  Procedure:  The patient was identified in the holding area and taken to [facility] OR ROOM 16  The patient was then placed supine on the table. general anesthesia was administered.  The patient was prepped and draped in the usual sterile fashion.  A time out was called and antibiotics were administered.  An assistant was necessary to expedite the procedure.  He helped with exposure by providing suction and retraction.  He helped with the sutures by following.  He helped with wound closure.  Ultrasound was used to identify the carotid bifurcation in the right neck.  I began by making an incision along the anterior border of the sternocleidomastoid.  Cautery was used to divide subcutaneous tissue and platysma muscle.  I then identified the common facial vein which was ligated between silk ties.  I was then able to expose the carotid bifurcation.  I did visualize the hypoglossal nerve.  This was protected throughout.  Once I had adequate exposure, attention was turned towards the right chest.  An incision was made 1 fingerbreadth below the clavicle.  Cautery was used to divide the subcutaneous tissue down to the pectoral fascia.  I then created a pocket anterior to the fascia.  Next, a tonsil clamp was used to create a tunnel between the 2 incisions.  The lead was  then brought through the tunnel and connected to the generator.  The generator was then placed into the pocket and we began testing.  Impedance was satisfactory.  I tested this at position "a".  We had a slight drop in blood pressure.  There was no heart rate change as the patient was paced.  I then proceeded to suture the electrode at position a using 6 Prolene sutures.  The strain relief loop was then sutured to the carotid adventitia with two 6-0 Prolene's.  The incisions were then irrigated with antibiotic solution.  I made sure that hemostasis was excellent.  I then secured the generator in the pocket with 2-0 Ethibond sutures.  The neck incision was closed by reapproximating the platysma muscle with 3-0 Vicryl and skin with 4-0 Vicryl.  The chest incision was closed by reapproximating the subcutaneous tissue with 3-0 Vicryl and skin with 4-0 Vicryl.  Dermabond was placed on the incisions.  The patient was successfully extubated and taken the recovery room in stable condition.  There were no complications.  Relevant operative details: The bifurcation was relatively high.  There was dense inflammatory reaction around the carotid bifurcation.  The hypoglossal nerve was low.  The device was tunneled posterior to the hypoglossal nerve.  I then tacked the device lateral so that it was not in contact with the hypoglossal nerve.   Disposition: To PACU stable  V. Durene Cal, M.D., [facility] [place] Office: [phone] Pager:  [phone] CHF

## 2023-06-25 NOTE — Anesthesia Procedure Notes (Signed)
Procedure Name: Intubation Date/Time: 06/25/2023 10:50 AM  Performed by: Sharyn Dross, CRNAPre-anesthesia Checklist: Patient identified, Emergency Drugs available, Suction available and Patient being monitored Patient Re-evaluated:Patient Re-evaluated prior to induction Oxygen Delivery Method: Circle system utilized Preoxygenation: Pre-oxygenation with 100% oxygen Induction Type: IV induction Ventilation: Mask ventilation without difficulty and Oral airway inserted - appropriate to patient size Laryngoscope Size: Glidescope and 4 Grade View: Grade I Tube type: Oral Tube size: 7.5 mm Number of attempts: 1 Airway Equipment and Method: Oral airway and Rigid stylet Placement Confirmation: ETT inserted through vocal cords under direct vision, positive ETCO2 and breath sounds checked- equal and bilateral Secured at: 23 cm Tube secured with: Tape Dental Injury: Teeth and Oropharynx as per pre-operative assessment

## 2023-06-25 NOTE — Telephone Encounter (Signed)
This message was sent via FAXCOM, a product from [employer]. [email or web address].biscom.com/                    -------Fax Transmission Report-------  To:               Recipient at [phone] Subject:          FW: [id number] SECURE Result:           The transmission was successful. Explanation:      All Pages Ok Pages Sent:       12 Connect Time:     6 minutes, 44 seconds Transmit Time:    06/25/2023 11:01 Transfer Rate:    9600 Status Code:      0000 Retry Count:      0 Job Id:           [id number] Unique Id:        [id number] Fax Line:         12 Fax Server:       [employer]

## 2023-06-25 NOTE — Transfer of Care (Signed)
Immediate Anesthesia Transfer of Care Note  Patient: Nathan Pruitt  Procedure(s) Performed: INSERTION OF RIGHT BAROSTIM (Right: Chest)  Patient Location: PACU  Anesthesia Type:General  Level of Consciousness: awake, alert , and oriented  Airway & Oxygen Therapy: Patient Spontanous Breathing  Post-op Assessment: Report given to RN and Post -op Vital signs reviewed and stable  Post vital signs: Reviewed and stable  Last Vitals:  Vitals Value Taken Time  BP 131/78   Temp 97.9   Pulse 68 06/25/23 1217  Resp 12   SpO2 100 % 06/25/23 1217  Vitals shown include unfiled device data.  Last Pain:  Vitals:   06/25/23 0856  TempSrc: Oral      Patients Stated Pain Goal: 1 (06/25/23 0904)  Complications: There were no known notable events for this encounter.

## 2023-06-25 NOTE — Telephone Encounter (Signed)
See Telephone Enc dated 06/24/23. Nothing further needed at this time.

## 2023-06-25 NOTE — Telephone Encounter (Signed)
Please schedule this pt's CT as requested.

## 2023-06-26 ENCOUNTER — Telehealth: Payer: Self-pay

## 2023-06-26 NOTE — Telephone Encounter (Signed)
Triage/Advice: -received message from pt stating he had an implant by Dr. Myra Gianotti and had a question.   -attempted return call x 2.  busy

## 2023-06-27 ENCOUNTER — Encounter: Payer: Self-pay | Admitting: Surgery

## 2023-07-07 ENCOUNTER — Other Ambulatory Visit (HOSPITAL_BASED_OUTPATIENT_CLINIC_OR_DEPARTMENT_OTHER): Payer: Medicare Other | Admitting: Radiology

## 2023-07-09 NOTE — Progress Notes (Signed)
  Cardiology Office Note:   Date:  07/11/2023  ID:  NAYSHAWN MESTA, DOB 1946/01/23, MRN [id number]  Primary Cardiologist: Marca Ancona, MD Electrophysiologist: Will Jorja Loa, MD   History of Present Illness:   Nathan Pruitt is a 78 y.o. male with h/o chronic systolic CHF, VT, CAD, and 1st degree AV block seen today for post hospital follow up.    Admitted 06/25/2023 for BAT insertion.   Since discharge from hospital the patient reports doing about the same. He is mostly limited by fatigue. Doesn't have much SOB or swelling. Tinkered in the garage on Sunday and then was exhausted most of the rest of the day. [id number] filled out today.   Review of systems complete and found to be negative unless listed in HPI.    EP Information / Studies Reviewed:    EKG is not ordered today. EKG from 04/2023 reviewed which showed AV dual paced rhythm at 64 bpm  Arrhythmia/Device History Medtronic Dual Chamber ICD implanted 11/2016 for VT, CHF  S/p AFL ablation 2010  ** ICM clinic **  HF MD--Dr Michae Kava Interrogation- Performed personally and reviewed in detail today,  See scanned report  ICD/PPM interrogation - Not performed today. See last PaceArt report    Physical Exam:   VS:  BP 112/70   Pulse 67   Ht 5' 9.5" (1.765 m)   Wt 142 lb (64.4 kg)   SpO2 99%   BMI 20.67 kg/m    Wt Readings from Last 3 Encounters:  07/11/23 142 lb (64.4 kg)  07/11/23 141 lb 9.6 oz (64.2 kg)  06/25/23 140 lb (63.5 kg)     GEN: Well nourished, well developed in no acute distress NECK: No JVD; No carotid bruits CARDIAC: Regular rate and rhythm, no murmurs, rubs, gallops RESPIRATORY:  Clear to auscultation without rales, wheezing or rhonchi  ABDOMEN: Soft, non-tender, non-distended EXTREMITIES:  No edema; No deformity   ASSESSMENT AND PLAN:    Chronic systolic CHF s/p Medtronic Defibrillator and Barostim implantation NYHA III symptoms.   Device titrated from 1.0 millamp to 5.0  milliamp by increments of 0.4 with good blood pressure response and no stimulation.  No stim all the way up to 6.0 Device impedence stable. Pt goals are to increase functional status and work in his garage more.  Normal device function See scanned report. Will follow up in 3-4 weeks to continue titration with goal of 6-8 milliamps for chronic settings.   VT No sustained episodes Continue amiodarone 100 mg daily  CAD  Denies s/s ischemia   Disposition:   Follow up with EP APP as above.   Signed, Graciella Freer, PA-C

## 2023-07-10 ENCOUNTER — Ambulatory Visit: Payer: Medicare Other

## 2023-07-10 ENCOUNTER — Encounter (HOSPITAL_BASED_OUTPATIENT_CLINIC_OR_DEPARTMENT_OTHER): Payer: Self-pay

## 2023-07-10 ENCOUNTER — Encounter: Payer: Medicare Other | Admitting: Student

## 2023-07-10 DIAGNOSIS — I440 Atrioventricular block, first degree: Secondary | ICD-10-CM | POA: Diagnosis not present

## 2023-07-11 ENCOUNTER — Encounter: Payer: Self-pay | Admitting: Surgery

## 2023-07-11 ENCOUNTER — Encounter: Payer: Self-pay | Admitting: Pulmonary Disease

## 2023-07-11 ENCOUNTER — Encounter: Payer: Self-pay | Admitting: Student

## 2023-07-11 ENCOUNTER — Ambulatory Visit: Payer: Medicare Other | Attending: Student | Admitting: Student

## 2023-07-11 ENCOUNTER — Ambulatory Visit: Payer: Medicare Other | Admitting: Pulmonary Disease

## 2023-07-11 VITALS — BP 110/62 | HR 74 | Ht 69.5 in | Wt 141.6 lb

## 2023-07-11 VITALS — BP 112/70 | HR 67 | Ht 69.5 in | Wt 142.0 lb

## 2023-07-11 DIAGNOSIS — I472 Ventricular tachycardia, unspecified: Secondary | ICD-10-CM | POA: Diagnosis not present

## 2023-07-11 DIAGNOSIS — I5022 Chronic systolic (congestive) heart failure: Secondary | ICD-10-CM | POA: Diagnosis not present

## 2023-07-11 DIAGNOSIS — I2510 Atherosclerotic heart disease of native coronary artery without angina pectoris: Secondary | ICD-10-CM | POA: Diagnosis not present

## 2023-07-11 DIAGNOSIS — J84112 Idiopathic pulmonary fibrosis: Secondary | ICD-10-CM | POA: Diagnosis not present

## 2023-07-11 NOTE — Patient Instructions (Signed)
 VISIT SUMMARY:  You came in today for a follow-up regarding your lung scarring. Your recent CT scan shows that the lung scarring is stable, and you have no new respiratory issues. We also reviewed your heart condition and the new biostroma implant, as well as your ongoing treatment for rheumatoid arthritis.  YOUR PLAN:  -PULMONARY FIBROSIS: Pulmonary fibrosis is a condition where lung tissue becomes scarred and stiff, making it difficult to breathe. Your recent CT scan shows that your lung scarring is stable, and you have no new symptoms. We will schedule a lung function test in one year and alternate between CT scans and lung function tests annually. Please report any changes in your symptoms immediately.  -RHEUMATOID ARTHRITIS: Rheumatoid arthritis is an autoimmune disease that causes joint inflammation and pain. Your condition is currently managed with leflunomide, which also helps keep your lung scarring stable. Continue taking leflunomide as prescribed.   INSTRUCTIONS:  Please schedule a lung function test in one year. Alternate between CT scans and lung function tests annually. Report any changes in your symptoms immediately.

## 2023-07-11 NOTE — Progress Notes (Signed)
 Nathan Pruitt    [id number]    Oct 14, 1945  Primary Care Physician:Pruitt, Nathan Evans, MD  Referring Physician: Marylen Ponto, MD [address] [name] [address],   Chief complaint: Follow-up for IPF  HPI: 78 y.o.  ex-smoker with history of coronary artery disease, peripheral artery disease, ischemic cardiomyopathy, V. tach status post ICD, hypertension, hyperlipidemia.  Referred by Dr. Shirlee Pruitt for evaluation of pulmonary fibrosis, interstitial lung disease.  He has history of V. tach in 2018, diagnosed with coronary artery disease status post stenting, cardiomyopathy with EF 40%.  He is being on amiodarone since 2018.  He has progressive dyspnea and underwent repeat repeat cath in September 2021 with in-stent restenosis just treated with balloon angioplasty.  There is also concern for amiodarone toxicity given changes on PFTs and CT scan and amiodarone has been held and referred to pulmonary for further evaluation  Has chronic dyspnea on exertion.  He admits that he is a slug and does not exercise very well.  Denies any cough, sputum production, fevers, chills  We discussed antifibrotic's but he is not interested in trying.  Labs show positive hypersensitivity panel but he does not note any exposures.  We had advised getting his house and recommended to start  Pets: No pets Occupation: Retired Quarry manager Exposures: No known exposures.  No mold, hot tub, Jacuzzi.  No feather pillows or comforters Smoking history: 47-pack-year smoker.  Quit in 2013 Travel history: No significant travel history Relevant family history: No significant travel history of lung disease  Interim history: Discussed the use of AI scribe software for clinical note transcription with the patient, who gave verbal consent to proceed.  The patient presents for a follow-up regarding lung scarring.  The lung scarring has been stable according to a recent CT scan performed four days ago.  He has no new respiratory issues and remains asymptomatic.  He has a history of a heart condition and has a defibrillator implant. Recently, a new device called a 'biostroma' was implanted in his neck to regulate his heart rate by slowing it down if it starts to pump too much blood.  He also has rheumatoid arthritis and is under the care of a specialist. He is currently taking leflunomide, which he has been on for several years, noting that without this medication, he 'can't hardly walk'. He also mentions taking Plavix, which he has been on for three to four years.   Outpatient Encounter Medications as of 07/11/2023  Medication Sig   acetaminophen (TYLENOL) 500 MG tablet Take 1,000 mg by mouth every 6 (six) hours as needed for moderate pain (pain score 4-6).   amiodarone (PACERONE) 200 MG tablet Take 1/2 (one-half) tablet by mouth once daily   carvedilol (COREG) 12.5 MG tablet Take 1 tablet by mouth twice daily   chlorhexidine (PERIDEX) 0.12 % solution Use as directed 15 mLs in the mouth or throat daily as needed (irritation).    cholecalciferol (VITAMIN D3) 25 MCG (1000 UNIT) tablet Take 1,000 Units by mouth daily.   clopidogrel (PLAVIX) 75 MG tablet Take 1 tablet (75 mg total) by mouth daily with breakfast.   dapagliflozin propanediol (FARXIGA) 10 MG TABS tablet Take 1 tablet (10 mg total) by mouth daily before breakfast.   eplerenone (INSPRA) 25 MG tablet Take 1 tablet by mouth once daily   fluticasone (FLONASE) 50 MCG/ACT nasal spray Place 1-2 sprays into both nostrils daily as needed for allergies or rhinitis.  furosemide (LASIX) 40 MG tablet Take 1 tablet (40 mg total) by mouth every morning AND 0.5 tablets (20 mg total) every evening.   leflunomide (ARAVA) 20 MG tablet Take 20 mg by mouth daily.   levothyroxine (SYNTHROID) 25 MCG tablet TAKE 1 TABLET BY MOUTH ONCE  DAILY BEFORE BREAKFAST   Melatonin 10 MG TABS Take 10 mg by mouth at bedtime as needed (sleep).    simvastatin (ZOCOR) 20 MG  tablet TAKE 1 TABLET BY MOUTH ONCE DAILY AT BEDTIME   traMADol (ULTRAM) 50 MG tablet Take 1 tablet (50 mg total) by mouth every 12 (twelve) hours as needed.   No facility-administered encounter medications on file as of 07/11/2023.    Physical Exam: Blood pressure 110/62, pulse 74, height 5' 9.5" (1.765 m), weight 141 lb 9.6 oz (64.2 kg), SpO2 96%. Gen:      No acute distress HEENT:  EOMI, sclera anicteric Neck:     No masses; no thyromegaly Lungs:    Clear to auscultation bilaterally; normal respiratory effort CV:         Regular rate and rhythm; no murmurs Abd:      + bowel sounds; soft, non-tender; no palpable masses, no distension Ext:    No edema; adequate peripheral perfusion Skin:      Warm and dry; no rash Neuro: alert and oriented x 3 Psych: normal mood and affect   Data Reviewed: Imaging: High-res CT chest 03/07/2020-patchy areas of groundglass attenuation with septal thickening, reticulation and traction bronchiectasis with basal gradient.  No honeycombing.  Probable UIP pattern.   High-res CT 04/16/2021-pulmonary fibrosis and probable UIP pattern.  Mildly progressive compared to prior  High resolution CT scan 07/05/2022-stable pattern of pulmonary fibrosis and probable UIP pattern, bronchial wall thickening with fine centrilobular nodularity, emphysema  High resolution CT scan 08/08/2023-stable pulmonary fibrosis and probable UIP pattern, moderate emphysema I have reviewed the images personally.  PFTs: 01/28/2020 FVC 3.28 [9 7%], FEV1 2.45 [10 9%], F/F 75, TLC 5.62 [39%], DLCO 13.54 [53%] Minimal restriction with moderate-severe diffusion defect  07/23/2021 FVC 3.47 [82%], FEV1 2.72 [89%], F/F 78, TLC 5.60 [79%], DLCO 12.15 [48%] Minimal restriction moderate-severe diffusion defect  Labs: Sed rate 03/15/2020-45  CTD serologies 04/06/2020-negative except for positive hypersensitivity panel for Thermoact. Saccharii .  Assessment:  Pulmonary fibrosis Lung scarring is  stable on recent CT scan, possibly related to rheumatoid arthritis. He is asymptomatic and opts against treatment due to cost concerns. Controlled rheumatoid arthritis may contribute to lung stability. I had an extensive discussion with patient. Discussed antifibrotic therapy but he would like to hold off due to expense.  He is skeptical of using antifibrotics and similar expensive medications if it does not improve his quality of life.  - Schedule lung function test in one year - Alternate between CT scan and lung function test annually - Advise to report any changes in symptoms immediately  Rheumatoid arthritis Rheumatoid arthritis is managed with leflunomide, contributing to lung scarring stability. 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  He does not have significant exposure history or connective tissue disease symptoms. 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 However I agree with stopping amiodarone if he does not absolutely need it for his history of V. tach. Hypersensitivity panel is positive but he does not note any exposures.  CT and PFTs reviewed.  His pulmonary fibrosis  is stable.  The latest CT scan does shows subtle centrilobular nodularity in the upper lobes which could represent smoking-related lung disease but he quit smoking many years ago.  There are no exposures to suggest hypersensitivity pneumonitis though we did note a positive HP panel in 2021  Plan/Recommendations: PFTs in 1 year  Chilton Greathouse MD [facility] 07/11/2023, 8:42 AM  CC: Nathan Ponto, MD

## 2023-07-12 LAB — CUP PACEART REMOTE DEVICE CHECK
Battery Remaining Longevity: 57 mo
Battery Voltage: 2.91 V
Brady Statistic AP VP Percent: 64.9 %
Brady Statistic AP VS Percent: 0.15 %
Brady Statistic AS VP Percent: 34.79 %
Brady Statistic AS VS Percent: 0.17 %
Brady Statistic RA Percent Paced: 62.82 %
Brady Statistic RV Percent Paced: 97.59 %
Date Time Interrogation Session: 20250306213626
HighPow Impedance: 36 Ohm
HighPow Impedance: 45 Ohm
Implantable Lead Connection Status: 753985
Implantable Lead Connection Status: 753985
Implantable Lead Connection Status: 753985
Implantable Lead Implant Date: 20100520
Implantable Lead Implant Date: 20100520
Implantable Lead Implant Date: 20230602
Implantable Lead Location: 753858
Implantable Lead Location: 753859
Implantable Lead Location: 753860
Implantable Lead Model: 4598
Implantable Lead Model: 5076
Implantable Lead Model: 6947
Implantable Pulse Generator Implant Date: 20230602
Lead Channel Impedance Value: 160.941
Lead Channel Impedance Value: 160.941
Lead Channel Impedance Value: 160.941
Lead Channel Impedance Value: 171 Ohm
Lead Channel Impedance Value: 171 Ohm
Lead Channel Impedance Value: 247 Ohm
Lead Channel Impedance Value: 304 Ohm
Lead Channel Impedance Value: 304 Ohm
Lead Channel Impedance Value: 342 Ohm
Lead Channel Impedance Value: 342 Ohm
Lead Channel Impedance Value: 342 Ohm
Lead Channel Impedance Value: 418 Ohm
Lead Channel Impedance Value: 418 Ohm
Lead Channel Impedance Value: 551 Ohm
Lead Channel Impedance Value: 551 Ohm
Lead Channel Impedance Value: 551 Ohm
Lead Channel Impedance Value: 589 Ohm
Lead Channel Impedance Value: 608 Ohm
Lead Channel Pacing Threshold Amplitude: 0.625 V
Lead Channel Pacing Threshold Amplitude: 1.375 V
Lead Channel Pacing Threshold Pulse Width: 0.4 ms
Lead Channel Pacing Threshold Pulse Width: 0.4 ms
Lead Channel Sensing Intrinsic Amplitude: 0.375 mV
Lead Channel Sensing Intrinsic Amplitude: 0.375 mV
Lead Channel Sensing Intrinsic Amplitude: 7.125 mV
Lead Channel Sensing Intrinsic Amplitude: 7.125 mV
Lead Channel Setting Pacing Amplitude: 1.5 V
Lead Channel Setting Pacing Amplitude: 2 V
Lead Channel Setting Pacing Amplitude: 2.25 V
Lead Channel Setting Pacing Pulse Width: 0.4 ms
Lead Channel Setting Pacing Pulse Width: 0.8 ms
Lead Channel Setting Sensing Sensitivity: 0.3 mV

## 2023-07-15 ENCOUNTER — Ambulatory Visit: Payer: Medicare Other | Admitting: Student

## 2023-07-30 NOTE — Progress Notes (Unsigned)
  Cardiology Office Note:   Date:  07/31/2023  ID:  Nathan Pruitt, DOB [birth year], MRN [id number]  Primary Cardiologist: Marca Ancona, MD Electrophysiologist: Will Jorja Loa, MD   History of Present Illness:   Nathan Pruitt is a 78 y.o. male with h/o chronic systolic CHF, VT, CAD, and 1st degree AV block seen today for post hospital follow up.    Admitted 06/25/2023 for BAT insertion.   At last visit, device titrated from 1.0 from 5.0. Feels like he's getting 45-60 minutes longer of good activity. Remains mostly limited by fatigue. Doesn't have much SOB or swelling.   Review of systems complete and found to be negative unless listed in HPI.    EP Information / Studies Reviewed:    EKG is not ordered today. EKG from 04/2023 reviewed which showed AV dual paced rhythm at 64 bpm  Arrhythmia/Device History Medtronic Dual Chamber ICD implanted 11/2016 for VT, CHF  S/p AFL ablation 2010  ** ICM clinic **  HF MD--Dr Michae Kava Interrogation- Performed personally and reviewed in detail today. See scanned report.  ICD/PPM interrogation - Not performed today. See last PaceArt report    Physical Exam:   VS:  BP 118/68 (BP Location: Left Arm, Patient Position: Sitting, Cuff Size: Normal)   Pulse 66   Resp 16   Ht 5\' 9"  (1.753 m)   Wt 141 lb 12.8 oz (64.3 kg)   SpO2 97%   BMI 20.94 kg/m    Wt Readings from Last 3 Encounters:  07/31/23 141 lb 12.8 oz (64.3 kg)  07/11/23 142 lb (64.4 kg)  07/11/23 141 lb 9.6 oz (64.2 kg)    GEN: Well nourished, well developed in no acute distress NECK: No JVD; No carotid bruits CARDIAC: Regular rate and rhythm, no murmurs, rubs, gallops RESPIRATORY:  Clear to auscultation without rales, wheezing or rhonchi  ABDOMEN: Soft, non-tender, non-distended EXTREMITIES:  No edema; No deformity    ASSESSMENT AND PLAN:    Chronic systolic CHF s/p Medtronic Defibrillator and Barostim implantation NYHA III symptoms.   Device titrated  from 5.0 millamp to 8.0 milliamp by increments of 0.4 with good blood pressure response and no stimulation.  Tested all the way up to 9.0 without stim Device impedence stable. Pt goals are to increase functional status and work in his garage more.  Normal device function See scanned report.  VT No sustained episodes Continue amiodarone 100 mg daily  CAD  Denies s/s ischemia   Disposition:   Follow up with EP in 4 months in [place] (6 month from implant)  Signed, Graciella Freer, PA-C

## 2023-07-31 ENCOUNTER — Encounter: Payer: Self-pay | Admitting: Student

## 2023-07-31 ENCOUNTER — Ambulatory Visit: Attending: Student | Admitting: Student

## 2023-07-31 VITALS — BP 118/68 | HR 66 | Resp 16 | Ht 69.0 in | Wt 141.8 lb

## 2023-07-31 DIAGNOSIS — I472 Ventricular tachycardia, unspecified: Secondary | ICD-10-CM

## 2023-07-31 DIAGNOSIS — I2510 Atherosclerotic heart disease of native coronary artery without angina pectoris: Secondary | ICD-10-CM

## 2023-07-31 DIAGNOSIS — I5022 Chronic systolic (congestive) heart failure: Secondary | ICD-10-CM

## 2023-07-31 NOTE — Patient Instructions (Signed)
 Medication Instructions:  Your physician recommends that you continue on your current medications as directed. Please refer to the Current Medication list given to you today.  *If you need a refill on your cardiac medications before your next appointment, please call your pharmacy*  Lab Work: None ordered If you have labs (blood work) drawn today and your tests are completely normal, you will receive your results only by: MyChart Message (if you have MyChart) OR A paper copy in the mail If you have any lab test that is abnormal or we need to change your treatment, we will call you to review the results.  Follow-Up: At [facility], you and your health needs are our priority.  As part of our continuing mission to provide you with exceptional heart care, we have created designated Provider Care Teams.  These Care Teams include your primary Cardiologist (physician) and Advanced Practice Providers (APPs -  Physician Assistants and Nurse Practitioners) who all work together to provide you with the care you need, when you need it.  Your next appointment:   4 month(s)  Provider:   Loman Brooklyn, MD in [place]

## 2023-08-11 ENCOUNTER — Other Ambulatory Visit (HOSPITAL_COMMUNITY): Payer: Self-pay | Admitting: Cardiology

## 2023-08-15 NOTE — Addendum Note (Signed)
 Addended by: Elease Etienne A on: 08/15/2023 02:08 PM   Modules accepted: Orders

## 2023-08-15 NOTE — Progress Notes (Signed)
 Remote ICD transmission.

## 2023-08-25 ENCOUNTER — Ambulatory Visit: Attending: Student | Admitting: Student

## 2023-08-25 ENCOUNTER — Encounter: Payer: Self-pay | Admitting: Student

## 2023-08-25 VITALS — BP 108/56 | HR 63 | Ht 69.0 in | Wt 134.7 lb

## 2023-08-25 DIAGNOSIS — I5022 Chronic systolic (congestive) heart failure: Secondary | ICD-10-CM

## 2023-08-25 DIAGNOSIS — I2510 Atherosclerotic heart disease of native coronary artery without angina pectoris: Secondary | ICD-10-CM

## 2023-08-25 DIAGNOSIS — I472 Ventricular tachycardia, unspecified: Secondary | ICD-10-CM | POA: Diagnosis not present

## 2023-08-25 NOTE — Patient Instructions (Signed)
 Medication Instructions:  Your physician recommends that you continue on your current medications as directed. Please refer to the Current Medication list given to you today.  *If you need a refill on your cardiac medications before your next appointment, please call your pharmacy*  Lab Work: None ordered If you have labs (blood work) drawn today and your tests are completely normal, you will receive your results only by: MyChart Message (if you have MyChart) OR A paper copy in the mail If you have any lab test that is abnormal or we need to change your treatment, we will call you to review the results.  Follow-Up: At [facility], you and your health needs are our priority.  As part of our continuing mission to provide you with exceptional heart care, our providers are all part of one team.  This team includes your primary Cardiologist (physician) and Advanced Practice Providers or APPs (Physician Assistants and Nurse Practitioners) who all work together to provide you with the care you need, when you need it.  Your next appointment:   As scheduled  Provider:   Agatha Horsfall, MD in [place]     1st Floor: - Lobby - Registration  - Pharmacy  - Lab - Cafe  2nd Floor: - PV Lab - Diagnostic Testing (echo, CT, nuclear med)  3rd Floor: - Vacant  4th Floor: - TCTS (cardiothoracic surgery) - AFib Clinic - Structural Heart Clinic - Vascular Surgery  - Vascular Ultrasound  5th Floor: - HeartCare Cardiology (general and EP) - Clinical Pharmacy for coumadin, hypertension, lipid, weight-loss medications, and med management appointments    Valet parking services will be available as well.

## 2023-08-25 NOTE — Progress Notes (Signed)
  Cardiology Office Note:   Date:  08/25/2023  ID:  Nathan Pruitt, DOB 06/23/1945, MRN [id number]  Primary Cardiologist: Peder Bourdon, MD Electrophysiologist: Will Cortland Ding, MD   History of Present Illness:   Nathan Pruitt is a 78 y.o. male with h/o chronic systolic CHF, VT, CAD, and 1st degree AV block seen today for post hospital follow up.    Admitted 06/25/2023 for BAT insertion.   At last visit, device titrated from 5.0 to 8.0 ma. Tested all the way up to 90. Without stim.   Pt called last week with reports of a tingling sensation when he turns his head to the right.  Replicable in office today at 8.0 ma, but resolved at 7.0 ma.  Initially had recurrence, but device had reverted to 8.0 programming at conclusion of testing.  Offered to turn down further at the risk of decreasing amount of therapy, pt wishes to remain at 7.0 for now.  He feels like his energy has again started to downtrend, though overall by reports he is able to tolerate a little more activity than his prior baseline.  Also complains of balance issues at times. Denies SOB or swelling, primarily limited by fatigue.   Review of systems complete and found to be negative unless listed in HPI.    EP Information / Studies Reviewed:    EKG is not ordered today. EKG from 04/2023 reviewed which showed AV dual paced rhythm at 64 bpm  Arrhythmia/Device History Medtronic Dual Chamber ICD implanted 11/2016 for VT, CHF  S/p AFL ablation 2010  S/p Barostim implant 06/25/2023   Barostim Interrogation- Performed personally and reviewed in detail today.  See scanned report  ICD/PPM interrogation - Not performed today. See last [employer] report    Physical Exam:   VS:  BP (!) 108/56   Pulse 63   Ht 5\' 9"  (1.753 m)   Wt 134 lb 11.2 oz (61.1 kg)   SpO2 96%   BMI 19.89 kg/m    Wt Readings from Last 3 Encounters:  08/25/23 134 lb 11.2 oz (61.1 kg)  07/31/23 141 lb 12.8 oz (64.3 kg)  07/11/23 142 lb (64.4 kg)     GEN: Well nourished, well developed in no acute distress NECK: No JVD; No carotid bruits CARDIAC: Regular rate and rhythm, no murmurs, rubs, gallops RESPIRATORY:  Clear to auscultation without rales, wheezing or rhonchi  ABDOMEN: Soft, non-tender, non-distended EXTREMITIES:  No edema; No deformity    ASSESSMENT AND PLAN:    Chronic systolic CHF s/p Medtronic Defibrillator and Barostim implantation NYHA III symptoms.   Device titrated from 8.0 millamp to 7.0 milliamp with resolution of intermittent positional stim.  Device impedence stable.  Pt goals are to increase functional status and work in his garage more.  Normal device function.  See scanned report. Had expressed desire to update labs including BNP today but pt declines due to cost. Would plan updating Echo after July visit, as well as labs at that time.  Prior RHC showed low but not markedly low cardiac output.  Encouraged him to reach out to HF team if he feels his HF symptoms are progressing.  For now, he refuses additional work up.   VT No sustained episodes Continue amiodarone  100 mg daily  CAD  Denies s/s ischemia  Disposition:  Follow up with EP as scheduled in [place]. Reminder sent for industry.   Signed, Tylene Galla, PA-C

## 2023-09-02 ENCOUNTER — Encounter (HOSPITAL_COMMUNITY): Payer: Self-pay | Admitting: Cardiology

## 2023-09-19 ENCOUNTER — Telehealth: Payer: Self-pay

## 2023-09-19 NOTE — Telephone Encounter (Signed)
 Alert received from [employer] for AT/AF Daily Burden > Threshold. AF/AFL in progress from 5/15 @ 05:27, controlled rates, hx of AFL ablation 2010, no OAC.  Route to triage high alert per protocol.  Attempted to contact patient. Unable to leave VM d/t phone rings continuously.

## 2023-09-22 NOTE — Telephone Encounter (Signed)
 Still in AF/AFL. Pt endorses SOB w/ acute on chronic fatigue, feelings of uneasiness. Rates well controlled. Sending to AF clinic for acute management. Pt is very hard of hearing and would like the first available appointment that is first thing in the morning. Sent to MD as Arlie Lain.

## 2023-09-22 NOTE — Telephone Encounter (Signed)
 Was feeling bad a few days ago. No specifics. Doesn't quite remember. Pt sending another transmission to assess AFL burden.

## 2023-09-23 ENCOUNTER — Encounter (HOSPITAL_COMMUNITY): Admitting: Cardiology

## 2023-10-02 ENCOUNTER — Ambulatory Visit (HOSPITAL_COMMUNITY)
Admission: RE | Admit: 2023-10-02 | Discharge: 2023-10-02 | Disposition: A | Discharge status: Home / Self Care | Source: Ambulatory Visit | Attending: Internal Medicine | Admitting: Internal Medicine

## 2023-10-02 ENCOUNTER — Encounter (HOSPITAL_COMMUNITY): Payer: Self-pay | Admitting: Internal Medicine

## 2023-10-02 VITALS — BP 110/64 | HR 61 | Ht 69.0 in | Wt 135.8 lb

## 2023-10-02 DIAGNOSIS — I4891 Unspecified atrial fibrillation: Secondary | ICD-10-CM | POA: Diagnosis not present

## 2023-10-02 DIAGNOSIS — D6869 Other thrombophilia: Secondary | ICD-10-CM

## 2023-10-02 DIAGNOSIS — I480 Paroxysmal atrial fibrillation: Secondary | ICD-10-CM | POA: Diagnosis not present

## 2023-10-02 DIAGNOSIS — Z5181 Encounter for therapeutic drug level monitoring: Secondary | ICD-10-CM

## 2023-10-02 DIAGNOSIS — Z79899 Other long term (current) drug therapy: Secondary | ICD-10-CM

## 2023-10-02 MED ORDER — APIXABAN 5 MG PO TABS: 5.0000 mg | ORAL_TABLET | Freq: Two times a day (BID) | ORAL | 3 refills | Status: DC

## 2023-10-02 NOTE — Progress Notes (Signed)
 Primary Care Physician: Gaither Juba, MD Primary Cardiologist: Peder Bourdon, MD Electrophysiologist: Will Cortland Ding, MD     Referring Physician: Device clinic     Nathan Pruitt is a 78 y.o. male with a history of chronic systolic CHF s/p ICD, VT, CAD, HTN, HLD, PAD, 1st degree AV block, and atrial fibrillation who presents for consultation in the [facility]. He is on amiodarone  100 mg daily. Device clinic alert on 09/19/23 for new Afib. Patient has a CHADS2VASC score of 5.  On evaluation today, he is currently in V paced rhythm. Patient understands new Afib was found and is okay to start anticoagulant but only if it is cost feasible.  Today, he denies symptoms of palpitations, chest pain, shortness of breath, orthopnea, PND, lower extremity edema, dizziness, presyncope, syncope, snoring, daytime somnolence, bleeding, or neurologic sequela. The patient is tolerating medications without difficulties and is otherwise without complaint today.    he has a BMI of Body mass index is 20.05 kg/m.Aaron Aas Filed Weights   10/02/23 0817  Weight: 61.6 kg    Current Outpatient Medications  Medication Sig Dispense Refill   acetaminophen  (TYLENOL ) 500 MG tablet Take 1,000 mg by mouth every 6 (six) hours as needed for moderate pain (pain score 4-6).     amiodarone  (PACERONE ) 200 MG tablet TAKE ONE-HALF TABLET BY MOUTH  ONCE DAILY 45 tablet 3   carvedilol  (COREG ) 12.5 MG tablet Take 1 tablet by mouth twice daily 180 tablet 3   chlorhexidine  (PERIDEX ) 0.12 % solution Use as directed 15 mLs in the mouth or throat daily as needed (irritation).   3   cholecalciferol (VITAMIN D3) 25 MCG (1000 UNIT) tablet Take 1,000 Units by mouth daily.     clopidogrel  (PLAVIX ) 75 MG tablet Take 1 tablet (75 mg total) by mouth daily with breakfast. 90 tablet 3   dapagliflozin  propanediol (FARXIGA ) 10 MG TABS tablet Take 1 tablet (10 mg total) by mouth daily before breakfast. 90 tablet  3   eplerenone  (INSPRA ) 25 MG tablet Take 1 tablet by mouth once daily 90 tablet 3   fluticasone  (FLONASE ) 50 MCG/ACT nasal spray Place 1-2 sprays into both nostrils daily as needed for allergies or rhinitis.     furosemide  (LASIX ) 40 MG tablet Take 1 tablet (40 mg total) by mouth every morning AND 0.5 tablets (20 mg total) every evening. 135 tablet 3   leflunomide  (ARAVA ) 20 MG tablet Take 20 mg by mouth daily.     levothyroxine  (SYNTHROID ) 25 MCG tablet TAKE 1 TABLET BY MOUTH ONCE  DAILY BEFORE BREAKFAST 90 tablet 3   Melatonin 10 MG TABS Take 10 mg by mouth at bedtime as needed (sleep).      simvastatin  (ZOCOR ) 20 MG tablet TAKE 1 TABLET BY MOUTH ONCE DAILY AT BEDTIME 90 tablet 3   No current facility-administered medications for this encounter.    Atrial Fibrillation Management history:  Previous antiarrhythmic drugs: amiodarone  Previous cardioversions: none Previous ablations: atrial flutter ablation in 2010 Anticoagulation history: none   ROS- All systems are reviewed and negative except as per the HPI above.  Physical Exam: BP 110/64   Pulse 61   Ht 5\' 9"  (1.753 m)   Wt 61.6 kg   BMI 20.05 kg/m   GEN: Well nourished, well developed in no acute distress NECK: No JVD; No carotid bruits CARDIAC: Regular rate (V paced), no murmurs, rubs, gallops RESPIRATORY:  Clear to auscultation without rales, wheezing or rhonchi  ABDOMEN:  Soft, non-tender, non-distended EXTREMITIES:  No edema; No deformity   EKG today demonstrates  Vent. rate 61 BPM PR interval * ms QRS duration 172 ms QT/QTcB 526/529 ms P-R-T axes * 233 65 Ventricular-paced rhythm - significant artifact noted Biventricular pacemaker detected Abnormal ECG When compared with ECG of 24-Apr-2023 08:49, Vent. rate has decreased BY 3 BPM  Echo 06/11/22 demonstrated  1. Left ventricular ejection fraction, by estimation, is 25 to 30%. The  left ventricle has severely decreased function. The left ventricle   demonstrates regional wall motion abnormalities with inferolateral and  anterolateral akinesis, basal to mid  inferior akinesis. The left ventricular internal cavity size was  moderately dilated. There is mild concentric left ventricular hypertrophy.  Left ventricular diastolic parameters are consistent with Grade I  diastolic dysfunction (impaired relaxation).   2. Right ventricular systolic function is mildly reduced. The right  ventricular size is normal. There is normal pulmonary artery systolic  pressure. The estimated right ventricular systolic pressure is 34.8 mmHg.   3. Left atrial size was moderately dilated.   4. The mitral valve is normal in structure. Mild to moderate mitral valve  regurgitation. No evidence of mitral stenosis.   5. The aortic valve is tricuspid. There is mild calcification of the  aortic valve. Aortic valve regurgitation is mild. No aortic stenosis is  present.   6. The inferior vena cava is normal in size with greater than 50%  respiratory variability, suggesting right atrial pressure of 3 mmHg.   ASSESSMENT & PLAN CHA2DS2-VASc Score = 5  The patient's score is based upon: CHF History: 1 HTN History: 1 Diabetes History: 0 Stroke History: 0 Vascular Disease History: 1 Age Score: 2 Gender Score: 0       ASSESSMENT AND PLAN: Paroxysmal Atrial Fibrillation (ICD10:  I48.0) The patient's CHA2DS2-VASc score is 5, indicating a 7.2% annual risk of stroke.    He is currently in V paced rhythm. Continue amiodarone  100 mg daily as he takes this for history of VT.  High risk medication monitoring (ICD10: J342684) Patient requires ongoing monitoring for anti-arrhythmic medication which has the potential to cause life threatening arrhythmias or AV block. Qtc stable. Continue amiodarone  100 mg daily.   Secondary Hypercoagulable State (ICD10:  D68.69) The patient is at significant risk for stroke/thromboembolism based upon his CHA2DS2-VASc Score of 5.   Start Apixaban  (Eliquis ).  Discussion on risks vs benefits of anticoagulation. After discussion, will begin patient on Eliquis  5 mg BID and send to pharmacy. He will price check with pharmacy and contact us  if it is too expensive. We discussed other options such as Xarelto, Pradaxa, and coumadin. I discussed the necessity of doing a repeat CBC ~1 month after initiation and he preferred CBC order to do at [facility].    Follow up 1 month Afib clinic.    Minnie Amber, PA-C  Afib Clinic [facility] [address] [place], [place] [address] [phone]

## 2023-10-02 NOTE — Patient Instructions (Signed)
 Start Eliquis 5 mg twice a day  Have lab work done in 1 month

## 2023-10-06 ENCOUNTER — Encounter (HOSPITAL_COMMUNITY): Payer: Self-pay | Admitting: *Deleted

## 2023-10-06 ENCOUNTER — Other Ambulatory Visit (HOSPITAL_COMMUNITY): Payer: Self-pay

## 2023-10-06 ENCOUNTER — Telehealth (HOSPITAL_COMMUNITY): Payer: Self-pay | Admitting: Pharmacy Technician

## 2023-10-06 ENCOUNTER — Other Ambulatory Visit (HOSPITAL_COMMUNITY): Payer: Self-pay | Admitting: *Deleted

## 2023-10-06 DIAGNOSIS — I4891 Unspecified atrial fibrillation: Secondary | ICD-10-CM

## 2023-10-06 NOTE — Telephone Encounter (Signed)
 Advanced Heart Failure Patient Advocate Encounter  Patient called regarding Eliquis  assistance. We spoke about the 3% OOP rule that [employer] has before being eligible to apply. He is going to call back once that has been met and we will apply at that time.  Correne Dillon, CPhT

## 2023-10-08 LAB — CUP PACEART REMOTE DEVICE CHECK
Battery Remaining Longevity: 54 mo
Battery Voltage: 2.97 V
Brady Statistic AP VP Percent: 59.9 %
Brady Statistic AP VS Percent: 0.08 %
Brady Statistic AS VP Percent: 39.56 %
Brady Statistic AS VS Percent: 0.45 %
Brady Statistic RA Percent Paced: 55.64 %
Brady Statistic RV Percent Paced: 98.46 %
Date Time Interrogation Session: 20250604082925
HighPow Impedance: 32 Ohm
HighPow Impedance: 43 Ohm
Implantable Lead Connection Status: 753985
Implantable Lead Connection Status: 753985
Implantable Lead Connection Status: 753985
Implantable Lead Implant Date: 20100520
Implantable Lead Implant Date: 20100520
Implantable Lead Implant Date: 20230602
Implantable Lead Location: 753858
Implantable Lead Location: 753859
Implantable Lead Location: 753860
Implantable Lead Model: 4598
Implantable Lead Model: 5076
Implantable Lead Model: 6947
Implantable Pulse Generator Implant Date: 20230602
Lead Channel Impedance Value: 132.321
Lead Channel Impedance Value: 132.321
Lead Channel Impedance Value: 136.276
Lead Channel Impedance Value: 142.5 Ohm
Lead Channel Impedance Value: 147.097
Lead Channel Impedance Value: 247 Ohm
Lead Channel Impedance Value: 247 Ohm
Lead Channel Impedance Value: 285 Ohm
Lead Channel Impedance Value: 285 Ohm
Lead Channel Impedance Value: 304 Ohm
Lead Channel Impedance Value: 304 Ohm
Lead Channel Impedance Value: 361 Ohm
Lead Channel Impedance Value: 456 Ohm
Lead Channel Impedance Value: 475 Ohm
Lead Channel Impedance Value: 475 Ohm
Lead Channel Impedance Value: 475 Ohm
Lead Channel Impedance Value: 475 Ohm
Lead Channel Impedance Value: 513 Ohm
Lead Channel Pacing Threshold Amplitude: 0.625 V
Lead Channel Pacing Threshold Amplitude: 0.75 V
Lead Channel Pacing Threshold Amplitude: 1.25 V
Lead Channel Pacing Threshold Pulse Width: 0.4 ms
Lead Channel Pacing Threshold Pulse Width: 0.4 ms
Lead Channel Pacing Threshold Pulse Width: 0.4 ms
Lead Channel Sensing Intrinsic Amplitude: 0.75 mV
Lead Channel Sensing Intrinsic Amplitude: 0.75 mV
Lead Channel Sensing Intrinsic Amplitude: 2.75 mV
Lead Channel Sensing Intrinsic Amplitude: 2.75 mV
Lead Channel Setting Pacing Amplitude: 1.25 V
Lead Channel Setting Pacing Amplitude: 1.5 V
Lead Channel Setting Pacing Amplitude: 2.25 V
Lead Channel Setting Pacing Pulse Width: 0.4 ms
Lead Channel Setting Pacing Pulse Width: 0.8 ms
Lead Channel Setting Sensing Sensitivity: 0.3 mV

## 2023-10-09 ENCOUNTER — Ambulatory Visit (INDEPENDENT_AMBULATORY_CARE_PROVIDER_SITE_OTHER): Payer: Medicare Other

## 2023-10-09 DIAGNOSIS — I440 Atrioventricular block, first degree: Secondary | ICD-10-CM

## 2023-10-10 ENCOUNTER — Encounter (HOSPITAL_COMMUNITY): Payer: Self-pay | Admitting: Cardiology

## 2023-10-13 ENCOUNTER — Encounter (HOSPITAL_COMMUNITY): Payer: Self-pay | Admitting: Cardiology

## 2023-10-15 ENCOUNTER — Other Ambulatory Visit (HOSPITAL_COMMUNITY): Payer: Self-pay

## 2023-10-15 ENCOUNTER — Telehealth (HOSPITAL_COMMUNITY): Payer: Self-pay | Admitting: Pharmacy Technician

## 2023-10-15 NOTE — Telephone Encounter (Signed)
 Advanced Heart Failure Patient Advocate Encounter  Patient called to discuss assistance options for Eliquis . With his income this year, the 3% OOP that he would have to spend would be $910.32. He is not close to meeting the requirement at this time. A 30 day test claim of Eliquis  showed a $239.51 co-pay. This would meet his deductible and the co-pay for the rest of this year would be $141 for 90 days, or $47 for 30 days. Patient voiced understanding regarding pricing.  He has some other clinical concerns about taking Eliquis . I see the mychart message he sent yesterday after 4 was routed to Dr. Mitzie Anda this morning. I advised him of this and that he will receive a reply back. Advised him to call me back if he does not receive reply and I would message the provider as well.  Correne Dillon, CPhT

## 2023-10-17 ENCOUNTER — Ambulatory Visit: Payer: Self-pay | Admitting: Cardiology

## 2023-10-18 ENCOUNTER — Encounter (HOSPITAL_COMMUNITY): Payer: Self-pay | Admitting: Cardiology

## 2023-10-28 ENCOUNTER — Other Ambulatory Visit (HOSPITAL_COMMUNITY): Payer: Self-pay | Admitting: Cardiology

## 2023-10-29 ENCOUNTER — Telehealth (HOSPITAL_COMMUNITY): Payer: Self-pay | Admitting: Pharmacy Technician

## 2023-10-29 ENCOUNTER — Other Ambulatory Visit (HOSPITAL_COMMUNITY): Payer: Self-pay

## 2023-10-29 ENCOUNTER — Telehealth (HOSPITAL_COMMUNITY): Payer: Self-pay | Admitting: Cardiology

## 2023-10-29 NOTE — Telephone Encounter (Signed)
 Advanced Heart Failure Patient Advocate Encounter  Spoke with patient. He is going to pick up a 30 day supply of Eliquis  for now. Looks like Pradaxa would be cheaper for him in the future. Advised that I was not sure from a clinical perspective if Pradaxa was better for him than Eliquis . He is going to discuss options with Dr. Rolan at his July appointment. If he switches to Pradaxa, an RX would need to be sent to [employer] order.  Nathan JULIANNA Pa, CPhT

## 2023-10-29 NOTE — Telephone Encounter (Signed)
 Patient called to report co pay for Eliquis  is $400/90 day supply   Reports he cannot afford this and would like alternative (Copay for xarelto similar to eliquis )  Per Dr Rolan 6/6 He needs anticoagulation for atrial fibrillation.  If Eliquis  and Xarelto not covered (my top choices), either Pradaxa or warfarin acceptable.  With warfarin, have to get INR checks every 4-6 weeks so more difficult to use.   -will route to provider for input at this time

## 2023-10-29 NOTE — Telephone Encounter (Signed)
 Advanced Heart Failure Patient Advocate Encounter  The patient and I spoke about cost difference between Eliquis  and Pradaxa. He is aware that while Pradaxa is cheaper, I cannot say whether it is the best option for him clinically. He is going to discuss with Dr. Rolan in July. He has agreed to take the Eliquis , for now.  He also asked about stopping patient assistance for Farxiga . I am hesitant to call [employer] and tell them to d/c his assistance, until he and Dr. Rolan have spoken at his next visit. If the medication is taken off of his med list, will call [employer] at that time.   Almarie JULIANNA Pa, CPhT

## 2023-10-30 ENCOUNTER — Encounter

## 2023-10-31 ENCOUNTER — Ambulatory Visit (HOSPITAL_COMMUNITY)
Admission: RE | Admit: 2023-10-31 | Discharge: 2023-10-31 | Disposition: A | Discharge status: Home / Self Care | Source: Ambulatory Visit | Attending: Internal Medicine | Admitting: Internal Medicine

## 2023-10-31 ENCOUNTER — Telehealth: Payer: Self-pay

## 2023-10-31 VITALS — BP 112/70 | HR 61 | Ht 69.0 in | Wt 139.8 lb

## 2023-10-31 DIAGNOSIS — D6869 Other thrombophilia: Secondary | ICD-10-CM

## 2023-10-31 DIAGNOSIS — I4819 Other persistent atrial fibrillation: Secondary | ICD-10-CM | POA: Diagnosis not present

## 2023-10-31 DIAGNOSIS — Z5181 Encounter for therapeutic drug level monitoring: Secondary | ICD-10-CM | POA: Diagnosis not present

## 2023-10-31 DIAGNOSIS — I4891 Unspecified atrial fibrillation: Secondary | ICD-10-CM

## 2023-10-31 DIAGNOSIS — Z79899 Other long term (current) drug therapy: Secondary | ICD-10-CM

## 2023-10-31 NOTE — Addendum Note (Signed)
 Encounter addended by: Janel Nancy SAUNDERS, RN on: 10/31/2023 1:50 PM  Actions taken: New alternative orders accepted, Order list changed, Diagnosis association updated

## 2023-10-31 NOTE — Progress Notes (Addendum)
 Primary Care Physician: Ina Marcellus RAMAN, MD Primary Cardiologist: [name], MD Electrophysiologist: Will Gladis Norton, MD     Referring Physician: Device clinic     Nathan Pruitt is a 78 y.o. male with a history of chronic systolic CHF s/p ICD, VT, CAD, HTN, HLD, PAD, 1st degree AV block, and atrial fibrillation who presents for consultation in the [facility]rial Fibrillation [facility]. He is on amiodarone  100 mg daily. Device clinic alert on 09/19/23 for new Afib. Patient has a CHADS2VASC score of 5.  On follow up 10/31/23, patient is in V paced rhythm. He acknowledges to not feel well and did begin Eliquis  5 mg BID on 6/14. He took two doses of Eliquis  on 6/15. He would like to transition in the near future away from Eliquis  due to cost. He admits to having missed one or doses of Eliquis  since starting medication.   Today, he denies symptoms of palpitations, chest pain, shortness of breath, orthopnea, PND, lower extremity edema, dizziness, presyncope, syncope, snoring, daytime somnolence, bleeding, or neurologic sequela. The patient is tolerating medications without difficulties and is otherwise without complaint today.    he has a BMI of Body mass index is 20.64 kg/m.SABRA Filed Weights   10/31/23 0814  Weight: 63.4 kg    Current Outpatient Medications  Medication Sig Dispense Refill   acetaminophen  (TYLENOL ) 500 MG tablet Take 1,000 mg by mouth every 6 (six) hours as needed for moderate pain (pain score 4-6). (Patient taking differently: Take 1,000 mg by mouth as needed for moderate pain (pain score 4-6).)     amiodarone  (PACERONE ) 200 MG tablet TAKE ONE-HALF TABLET BY MOUTH  ONCE DAILY 45 tablet 3   apixaban  (ELIQUIS ) 5 MG TABS tablet Take 1 tablet (5 mg total) by mouth 2 (two) times daily. 60 tablet 3   carvedilol  (COREG ) 12.5 MG tablet Take 1 tablet by mouth twice daily 180 tablet 3   chlorhexidine  (PERIDEX ) 0.12 % solution Use as directed 15 mLs in the mouth or throat  daily as needed (irritation).   3   cholecalciferol (VITAMIN D3) 25 MCG (1000 UNIT) tablet Take 1,000 Units by mouth daily.     eplerenone  (INSPRA ) 25 MG tablet Take 1 tablet by mouth once daily 90 tablet 3   fluticasone  (FLONASE ) 50 MCG/ACT nasal spray Place 1-2 sprays into both nostrils daily as needed for allergies or rhinitis.     furosemide  (LASIX ) 40 MG tablet Take 1 tablet (40 mg total) by mouth every morning AND 0.5 tablets (20 mg total) every evening. 135 tablet 3   leflunomide  (ARAVA ) 20 MG tablet Take 20 mg by mouth daily.     levothyroxine  (SYNTHROID ) 25 MCG tablet TAKE 1 TABLET BY MOUTH ONCE  DAILY BEFORE BREAKFAST 90 tablet 3   Melatonin 10 MG TABS Take 10 mg by mouth at bedtime as needed (sleep).      simvastatin  (ZOCOR ) 20 MG tablet TAKE 1 TABLET BY MOUTH ONCE DAILY AT BEDTIME 90 tablet 3   clopidogrel  (PLAVIX ) 75 MG tablet Take 1 tablet (75 mg total) by mouth daily with breakfast. (Patient not taking: Reported on 10/31/2023) 90 tablet 3   dapagliflozin  propanediol (FARXIGA ) 10 MG TABS tablet Take 1 tablet (10 mg total) by mouth daily before breakfast. (Patient not taking: Reported on 10/31/2023) 90 tablet 3   No current facility-administered medications for this encounter.    Atrial Fibrillation Management history:  Previous antiarrhythmic drugs: amiodarone  Previous cardioversions: none Previous ablations: atrial flutter ablation  in 2010 Anticoagulation history: none   ROS- All systems are reviewed and negative except as per the HPI above.  Physical Exam: BP 112/70   Pulse 61   Ht 5' 9 (1.753 m)   Wt 63.4 kg   BMI 20.64 kg/m   GEN: Well nourished, well developed in no acute distress NECK: No JVD; No carotid bruits CARDIAC: Regular rate (V paced), no murmurs, rubs, gallops RESPIRATORY:  Clear to auscultation without rales, wheezing or rhonchi  ABDOMEN: Soft, non-tender, non-distended EXTREMITIES:  No edema; No deformity   EKG today demonstrates  Vent. rate 61  BPM PR interval * ms QRS duration 172 ms QT/QTcB 526/529 ms P-R-T axes * 233 65 Ventricular-paced rhythm - significant artifact noted Biventricular pacemaker detected Abnormal ECG When compared with ECG of 24-Apr-2023 08:49, Vent. rate has decreased BY 3 BPM  Echo 06/11/22 demonstrated  1. Left ventricular ejection fraction, by estimation, is 25 to 30%. The  left ventricle has severely decreased function. The left ventricle  demonstrates regional wall motion abnormalities with inferolateral and  anterolateral akinesis, basal to mid  inferior akinesis. The left ventricular internal cavity size was  moderately dilated. There is mild concentric left ventricular hypertrophy.  Left ventricular diastolic parameters are consistent with Grade I  diastolic dysfunction (impaired relaxation).   2. Right ventricular systolic function is mildly reduced. The right  ventricular size is normal. There is normal pulmonary artery systolic  pressure. The estimated right ventricular systolic pressure is 34.8 mmHg.   3. Left atrial size was moderately dilated.   4. The mitral valve is normal in structure. Mild to moderate mitral valve  regurgitation. No evidence of mitral stenosis.   5. The aortic valve is tricuspid. There is mild calcification of the  aortic valve. Aortic valve regurgitation is mild. No aortic stenosis is  present.   6. The inferior vena cava is normal in size with greater than 50%  respiratory variability, suggesting right atrial pressure of 3 mmHg.   ASSESSMENT & PLAN CHA2DS2-VASc Score = 5  The patient's score is based upon: CHF History: 1 HTN History: 1 Diabetes History: 0 Stroke History: 0 Vascular Disease History: 1 Age Score: 2 Gender Score: 0       ASSESSMENT AND PLAN: Persistent Atrial Fibrillation (ICD10:  I48.0) The patient's CHA2DS2-VASc score is 5, indicating a 7.2% annual risk of stroke.    He is currently in V paced rhythm. We discussed the procedure  cardioversion to try to convert to NSR. We discussed the risks vs benefits of this procedure and how ultimately we cannot predict whether a patient will have early return of arrhythmia post procedure. After discussion, the patient wishes to proceed with cardioversion. Labs drawn today. Will schedule for cardioversion 3 weeks from today out of safety concern due to patient having missed 1-2 doses of Eliquis  in the past week. He will contact clinic with availability.  Informed Consent   Shared Decision Making/Informed Consent The risks (stroke, cardiac arrhythmias rarely resulting in the need for a temporary or permanent pacemaker, skin irritation or burns and complications associated with conscious sedation including aspiration, arrhythmia, respiratory failure and death), benefits (restoration of normal sinus rhythm) and alternatives of a direct current cardioversion were explained in detail to Mr. Prestigiacomo and he agrees to proceed.       High risk medication monitoring (ICD10: U5195107) Patient requires ongoing monitoring for anti-arrhythmic medication which has the potential to cause life threatening arrhythmias or AV block. Qtc stable. Continue amiodarone  100  mg daily.   Secondary Hypercoagulable State (ICD10:  D68.69) The patient is at significant risk for stroke/thromboembolism based upon his CHA2DS2-VASc Score of 5.  Start Apixaban  (Eliquis ).  Continue Eliquis  5 mg BID without interruption. Recommended to set an alarm clock to not miss doses of Eliquis . He plans on changing Eliquis  due to cost in the future. Will help with samples so ideally he can transition away from Eliquis  30 days after cardioversion.    Follow up as scheduled with cardiologist.    Terra Pac, PA-C  Afib Clinic [facility] [address] [place], [place] [address] [phone]

## 2023-10-31 NOTE — Patient Instructions (Addendum)
 Will need to call us  back with a date after July 18 to proceed with Cardioversion   Do not miss any Eliquis       - Arrive at the [facility] ([address])  and check in with ADMITTING    - Do not eat or drink anything after midnight the night prior to your procedure.   - Take all your morning medication (except diabetic medications) with a sip of water  prior to arrival.  - Do NOT miss any doses of your blood thinner - if you should miss a dose or take a dose more than 4 hours late -- please notify our office immediately.  - You will not be able to drive home after your procedure. Please ensure you have a responsible adult to drive you home. You will need someone with you for 24 hours post procedure.     - Expect to be in the procedural area approximately 2 hours.   - If you feel as if you go back into normal rhythm prior to scheduled cardioversion, please notify our office immediately.   If your procedure is canceled in the cardioversion suite you will be charged a cancellation fee.

## 2023-10-31 NOTE — Telephone Encounter (Signed)
 Nathan Pruitt

## 2023-11-01 LAB — BASIC METABOLIC PANEL WITH GFR
BUN/Creatinine Ratio: 20 (ref 10–24)
BUN: 44 mg/dL — ABNORMAL HIGH (ref 8–27)
CO2: 20 mmol/L (ref 20–29)
Calcium: 8.3 mg/dL — ABNORMAL LOW (ref 8.6–10.2)
Chloride: 103 mmol/L (ref 96–106)
Creatinine, Ser: 2.22 mg/dL — ABNORMAL HIGH (ref 0.76–1.27)
Glucose: 93 mg/dL (ref 70–99)
Potassium: 4.3 mmol/L (ref 3.5–5.2)
Sodium: 140 mmol/L (ref 134–144)
eGFR: 30 mL/min/{1.73_m2} — ABNORMAL LOW (ref >59)

## 2023-11-01 LAB — CBC
Hematocrit: 38.1 % (ref 37.5–51.0)
Hemoglobin: 12.1 g/dL — ABNORMAL LOW (ref 13.0–17.7)
MCH: 32.4 pg (ref 26.6–33.0)
MCHC: 31.8 g/dL (ref 31.5–35.7)
MCV: 102 fL — ABNORMAL HIGH (ref 79–97)
Platelets: 187 10*3/uL (ref 150–450)
RBC: 3.74 x10E6/uL — ABNORMAL LOW (ref 4.14–5.80)
RDW: 14 % (ref 11.6–15.4)
WBC: 7.6 10*3/uL (ref 3.4–10.8)

## 2023-11-05 ENCOUNTER — Other Ambulatory Visit (HOSPITAL_COMMUNITY): Payer: Self-pay

## 2023-11-05 ENCOUNTER — Ambulatory Visit (HOSPITAL_COMMUNITY): Payer: Self-pay | Admitting: Internal Medicine

## 2023-11-05 MED ORDER — APIXABAN 5 MG PO TABS
5.0000 mg | ORAL_TABLET | Freq: Two times a day (BID) | ORAL | Status: DC
Start: 2023-11-05 — End: 2023-12-18

## 2023-11-10 ENCOUNTER — Encounter (HOSPITAL_COMMUNITY): Payer: Self-pay | Admitting: Cardiology

## 2023-11-14 ENCOUNTER — Telehealth (HOSPITAL_COMMUNITY): Payer: Self-pay | Admitting: Cardiology

## 2023-11-14 NOTE — Telephone Encounter (Signed)
 Called to confirm/remind patient of their appointment at the [facility] Failure Clinic on 11/14/2023.   Appointment:   [x] Confirmed  [] Left mess   [] No answer/No voice mail  [] VM Full/unable to leave message  [] Phone not in service  Patient reminded to bring all medications and/or complete list.  Confirmed patient has transportation. Gave directions, instructed to utilize valet parking.

## 2023-11-17 ENCOUNTER — Encounter (HOSPITAL_COMMUNITY): Payer: Self-pay | Admitting: Cardiology

## 2023-11-17 ENCOUNTER — Ambulatory Visit (HOSPITAL_COMMUNITY): Payer: Self-pay | Admitting: Cardiology

## 2023-11-17 ENCOUNTER — Ambulatory Visit (HOSPITAL_COMMUNITY)
Admission: RE | Admit: 2023-11-17 | Discharge: 2023-11-17 | Disposition: A | Discharge status: Home / Self Care | Source: Ambulatory Visit | Attending: Cardiology | Admitting: Cardiology

## 2023-11-17 ENCOUNTER — Other Ambulatory Visit (HOSPITAL_COMMUNITY): Payer: Self-pay

## 2023-11-17 ENCOUNTER — Telehealth (HOSPITAL_COMMUNITY): Payer: Self-pay

## 2023-11-17 VITALS — BP 104/60 | HR 61 | Wt 144.2 lb

## 2023-11-17 DIAGNOSIS — I4819 Other persistent atrial fibrillation: Secondary | ICD-10-CM | POA: Diagnosis not present

## 2023-11-17 DIAGNOSIS — I255 Ischemic cardiomyopathy: Secondary | ICD-10-CM | POA: Diagnosis not present

## 2023-11-17 DIAGNOSIS — I130 Hypertensive heart and chronic kidney disease with heart failure and stage 1 through stage 4 chronic kidney disease, or unspecified chronic kidney disease: Secondary | ICD-10-CM | POA: Diagnosis not present

## 2023-11-17 DIAGNOSIS — Z955 Presence of coronary angioplasty implant and graft: Secondary | ICD-10-CM | POA: Insufficient documentation

## 2023-11-17 DIAGNOSIS — I2510 Atherosclerotic heart disease of native coronary artery without angina pectoris: Secondary | ICD-10-CM | POA: Diagnosis not present

## 2023-11-17 DIAGNOSIS — Z4502 Encounter for adjustment and management of automatic implantable cardiac defibrillator: Secondary | ICD-10-CM | POA: Diagnosis not present

## 2023-11-17 DIAGNOSIS — Z87891 Personal history of nicotine dependence: Secondary | ICD-10-CM | POA: Insufficient documentation

## 2023-11-17 DIAGNOSIS — Z7901 Long term (current) use of anticoagulants: Secondary | ICD-10-CM | POA: Insufficient documentation

## 2023-11-17 DIAGNOSIS — E039 Hypothyroidism, unspecified: Secondary | ICD-10-CM | POA: Insufficient documentation

## 2023-11-17 DIAGNOSIS — I5022 Chronic systolic (congestive) heart failure: Secondary | ICD-10-CM | POA: Diagnosis not present

## 2023-11-17 DIAGNOSIS — N183 Chronic kidney disease, stage 3 unspecified: Secondary | ICD-10-CM | POA: Diagnosis not present

## 2023-11-17 DIAGNOSIS — Z79890 Hormone replacement therapy (postmenopausal): Secondary | ICD-10-CM | POA: Insufficient documentation

## 2023-11-17 DIAGNOSIS — R0609 Other forms of dyspnea: Secondary | ICD-10-CM | POA: Diagnosis not present

## 2023-11-17 DIAGNOSIS — E785 Hyperlipidemia, unspecified: Secondary | ICD-10-CM | POA: Diagnosis not present

## 2023-11-17 DIAGNOSIS — Z7984 Long term (current) use of oral hypoglycemic drugs: Secondary | ICD-10-CM | POA: Insufficient documentation

## 2023-11-17 DIAGNOSIS — I472 Ventricular tachycardia, unspecified: Secondary | ICD-10-CM | POA: Insufficient documentation

## 2023-11-17 DIAGNOSIS — Z79899 Other long term (current) drug therapy: Secondary | ICD-10-CM | POA: Diagnosis not present

## 2023-11-17 DIAGNOSIS — Z95810 Presence of automatic (implantable) cardiac defibrillator: Secondary | ICD-10-CM | POA: Diagnosis not present

## 2023-11-17 DIAGNOSIS — I714 Abdominal aortic aneurysm, without rupture, unspecified: Secondary | ICD-10-CM | POA: Diagnosis not present

## 2023-11-17 DIAGNOSIS — I4892 Unspecified atrial flutter: Secondary | ICD-10-CM | POA: Insufficient documentation

## 2023-11-17 DIAGNOSIS — I739 Peripheral vascular disease, unspecified: Secondary | ICD-10-CM | POA: Diagnosis not present

## 2023-11-17 DIAGNOSIS — J8410 Pulmonary fibrosis, unspecified: Secondary | ICD-10-CM | POA: Diagnosis not present

## 2023-11-17 LAB — COMPREHENSIVE METABOLIC PANEL WITH GFR
ALT: 15 U/L (ref 0–44)
AST: 16 U/L (ref 15–41)
Albumin: 3.4 g/dL — ABNORMAL LOW (ref 3.5–5.0)
Alkaline Phosphatase: 68 U/L (ref 38–126)
Anion gap: 12 (ref 5–15)
BUN: 41 mg/dL — ABNORMAL HIGH (ref 8–23)
CO2: 24 mmol/L (ref 22–32)
Calcium: 8.8 mg/dL — ABNORMAL LOW (ref 8.9–10.3)
Chloride: 102 mmol/L (ref 98–111)
Creatinine, Ser: 2.51 mg/dL — ABNORMAL HIGH (ref 0.61–1.24)
GFR, Estimated: 26 mL/min — ABNORMAL LOW (ref >60)
Glucose, Bld: 105 mg/dL — ABNORMAL HIGH (ref 70–99)
Potassium: 4.2 mmol/L (ref 3.5–5.1)
Sodium: 138 mmol/L (ref 135–145)
Total Bilirubin: 1 mg/dL (ref 0.0–1.2)
Total Protein: 6.2 g/dL — ABNORMAL LOW (ref 6.5–8.1)

## 2023-11-17 LAB — LIPID PANEL
Cholesterol: 135 mg/dL (ref 0–200)
HDL: 42 mg/dL (ref >40)
LDL Cholesterol: 74 mg/dL (ref 0–99)
Total CHOL/HDL Ratio: 3.2 ratio
Triglycerides: 97 mg/dL (ref <150)
VLDL: 19 mg/dL (ref 0–40)

## 2023-11-17 LAB — TSH: TSH: 17.203 u[IU]/mL — ABNORMAL HIGH (ref 0.350–4.500)

## 2023-11-17 MED ORDER — AMIODARONE HCL 200 MG PO TABS: 100.0000 mg | ORAL_TABLET | Freq: Every day | ORAL | 1 refills | Status: DC

## 2023-11-17 NOTE — H&P (View-Only) (Signed)
 ID:  Nathan Pruitt, DOB [birth year], MRN [id number]   Provider location: [facility] Type of Visit: Established patient   PCP:  Nathan Marcellus RAMAN, MD  Cardiologist:  Nathan Shuck, MD  Chief complaint: Fatigue  History of Present Illness: Nathan Pruitt is a 78 y.o. male with a history of history of CAD, PAD, ischemic CMP, and VT s/p ICD.    Echo in 2/17 showed EF 40% and regional WMAs.   In 3/18, he developed episodes of VT as well as increased dyspnea.  LHC/RHC was done, showing new occlusion of a large ramus (CTO, probably a month or so old at time of cath) and old CTO RCA with collaterals.  He had DES x 3 to ramus.  Filling pressures were optimized on RHC.  Amiodarone  was increased to 200 mg daily from 100 mg daily.  No VT since PCI.    Echo in 6/19 showed EF 35-40% with wall motion abnormalities. Echo in 1/21 showed EF 40-45% with basal to mid inferolateral and inferior akinesis, mild MR, normal RV, PASP 35 mmHg.   LHC/RHC in 9/21 with normal filling pressures and cardiac output but 95% OM1 stenosis.  This was treated with DES to OM1.   He has been following with Dr. Theophilus with idiopathic pulmonary fibrosis.  I was concerned that he could have amiodarone  -related lung toxicity but Dr. Theophilus did not think this was very likely.  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     Labs drawn in 1/22 showed K up to 5.9 and creatinine 4.  He was sent to the ER at [facility].  He was found to have COVID-19 and was admitted. He had a productive cough and sore throat.  He was thought to be dehydrated and got IV fluid.  Creatinine 2.3 at discharge.  Lasix , valsartan , and eplerenone  were stopped.  Other meds were continued.  Echo in 6/22 showed EF 30-35%, basal-mid inferior and inferolateral akinesis, anterolateral hypokinesis, moderate MR (appears infarct-related), mildly decreased RV function.   In 6/23, he had upgrade of device to CRT-D  given frequent RV pacing.  Echo in 2/24 showed EF 25-30% with anterolateral/inferolateral akinesis, basal-mid inferior akinesis, mild RV dysfunction, mild-moderate MR.   CPX in 10/24 was a submaximal study but probably severe HF limitation as well as deconditioning.   RHC in 11/24 showed normal filling pressures and CI 2.11 by Fick, 2.35 by thermodilution.   In 2/25, patient had baroreceptor activation therapy placed.   Patient was noted to go into atrial fibrillation/flutter by device interrogation.  He was started on Eliquis .   He returns today for followup of CHF and atrial fibrillation.  Since going into atrial fibrillation/flutter, he has been symptomatically worse.  He feels wobbly with poor balance.  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  Symptoms worse x about 1 month.  He has the same exertional dyspnea walking up a hill but the tiredness is the new finding.  He has been off Farxiga , not sure why.  He does not get insurance coverage for Eliquis , currently taking it from a 30 free supply. Weight is up 2 lbs.   ECG (personally reviewed): Atrial flutter, BiV pacing  Medtronic device interrogation: 95% BiV pacing, persistent AF/AFL, decreased thoracic impedance.    Labs (9/23): hgb 11.2, K 4.7, creatinine 2.2, LFTs normal, transferrin saturation 21% Labs (1/24): K 4.5, creatinine 2.3, LFTs normal, TSH slightly elevated  Labs (8/24): K 4.8, creatinine 2.3, LFTs normal, LDL 70 Labs (12/24): K 4.1, creatinine 1.9 Labs (6/25): K 4.3, creatinine 2.22, hgb 12.1   Allergies (verified):  No Known Drug Allergies   Past Medical History: 1. Coronary artery disease. The patient reports history of silent MI in 1993.  This was likely an inferior MI (see below).  - The patient presented to [facility] in [facility]10 with VT and mildly elevated cardiac enzymes. LHC (5/10):  Inferobasal dyskinesis with EF 35-40%.  There was chronic total occlusion of the mid RCA with good collaterals.   Luminals LCA.  This did not appear to be an acute cause of the 5/10 event.  - LHC (3/18): Known occlusion of RCA with collaterals, new occlusion of ramus.  DES x 3 to ramus.  - LHC (9/21): Known occlusion RCA with collaterals, 95% OMI treated with DES.  2. Hypertension.  3. Hyperlipidemia: Intolerant of higher doses of simvastatin , Crestor, Lipitor, pravastatin . Zetia  caused constipation.  4. Remote tobacco abuse with 47-pack-year history, quitting in August 2009.  5. Peripheral arterial disease.  - Status post left-to-right fem-fem bypass performed in [place] in March 2009.  - Status post redo left-to-right fem-fem bypass performed by Dr. Eliza at [facility] in 2009.  - ABIs normal 3/15, ABIs normal 3/16, ABIs normal 3/17, ABIs normal 4/18, ABIs normal 4/19, ABIs normal 12/20. ABIs stable 3/24.  6. Rheumatoid arthritis, on leflunomide .  7.  Ischemic cardiomyopathy:  EF 35-40% by LV-gram 5/10 with inferobasal dyskinesis.  Echo 5/10 showed EF 40% with mild LVH, no significant MR, inferobasal and posterobasal akinesis.  Echo (7/11): EF 50%, mild LVH, basal-mid inferoposterior akinesis.  Echo (7/12): EF 45-50% with basal anterolateral, basal posterior, and basal to mid inferior akinesis.  Echo (4/16): EF 40-45%, basal to mid inferolateral AK, basal inferior AK, basal to mid anterolateral HK.  Echo (2/17): EF 40% with basal to mid inferior akinesis, basal inferolateral aneurysm, mid inferolateral akinesis, basal anterolateral hypokinesis, normal RV size and systolic function, PASP 25 mmHg.  - Echo (3/18) with EF 30-35%, moderate LV dilation, moderate MR, mildly decreased RV systolic function, PASP 51 mmmHg. - RHC (3/18): mean RA 4, PA 38/12, mean PCWP 17, CI 2.2.  - Echo (6/19): EF 35-40%, inferior/inferolateral/anterolateral WMAs, moderate MR likely infarct-related, normal RV size with mildly decreased systolic function.  - Echo (1/21): EF 35-40% with wall motion abnormalities. Echo was done today and  reviewed, EF 40-45% with basal to mid inferolateral and inferior akinesis, mild MR, normal RV, PASP 35 mmHg.  - RHC (9/21): mean RA 4, PA 33/12, mean PCWP 9, CI 3.12 - Echo (6/22): EF 30-35%, basal-mid inferior and inferolateral akinesis, anterolateral hypokinesis, moderate MR (appears infarct-related), mildly decreased RV function.  - CRT-D upgrade (Medtronic) in 6/23.  - Echo (2/24): EF 25-30% with anterolateral/inferolateral akinesis, basal-mid inferior akinesis, mild RV dysfunction, mild-moderate MR. - CPX (10/24): submaximal with RER 1.04, peak VO2 11.6, VE/VCO2 slope 54; severe HF limitation, also with deconditioning.  - RHC (11/24): mean RA 2, PA 31/7, mean PCWP 11, CI 2.11 Fick/2.35 thermodilution.  - Baroreceptor activation therapy 2/25.  8.  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  VT storm 5/10 suppressed by amiodarone  and Coreg .  He has a Medtronic CRT-D device.  9.  Atrial flutter:  Status post isthmus ablation 5/10.   10. Restrictive lung defect: PFTs (7/11): FVC 74%, FEV1 80%, ratio 75%, TLC 78%, DLCO 68%.  This suggests a mild restrictive defect and a mild obstructive defect.  He  did have response to bronchodilator. These PFTs were significantly better than the report from Dr. Mardee in [place] done prior.  He had last PFTs in 2/12 with no significant change.  - PFTs (9/21) with significantly decreased DLCO and concern for ILD/pulmonary fibrosis.  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  11.  Anxiety 12.  Chronic cough: No relief with change from ACEI to ARB or with trial of PPI.  13.  CKD: Stage 3.  14. Mitral regurgitation: Moderate on 6/22 echo, suspect infarct-related.  15. AAA: 3.1 cm AAA on abdominal US  12/20  - Abdominal US  5/22 with 3.1 cm AAA - Abdominal US  2/24 with 3.4 cm AAA 16. COVID-19 infection: 1/22.  17. Atrial fibrillation/flutter: Persistent 18. Fe deficiency anemia  Current Outpatient Medications  Medication Sig  Dispense Refill   acetaminophen  (TYLENOL ) 500 MG tablet Take 1,000 mg by mouth every 6 (six) hours as needed for moderate pain (pain score 4-6).     amiodarone  (PACERONE ) 200 MG tablet Take 0.5 tablets (100 mg total) by mouth daily. 90 tablet 1   apixaban  (ELIQUIS ) 5 MG TABS tablet Take 1 tablet (5 mg total) by mouth 2 (two) times daily.     carvedilol  (COREG ) 12.5 MG tablet Take 1 tablet by mouth twice daily 180 tablet 3   chlorhexidine  (PERIDEX ) 0.12 % solution Use as directed 15 mLs in the mouth or throat daily as needed (irritation).   3   cholecalciferol (VITAMIN D3) 25 MCG (1000 UNIT) tablet Take 1,000 Units by mouth daily.     eplerenone  (INSPRA ) 25 MG tablet Take 1 tablet by mouth once daily 90 tablet 0   fluticasone  (FLONASE ) 50 MCG/ACT nasal spray Place 1-2 sprays into both nostrils daily as needed for allergies or rhinitis.     furosemide  (LASIX ) 40 MG tablet Take 40 mg in am and 20 mg in pm daily 135 tablet 3   leflunomide  (ARAVA ) 20 MG tablet Take 20 mg by mouth daily.     levothyroxine  (SYNTHROID ) 25 MCG tablet TAKE 1 TABLET BY MOUTH ONCE  DAILY BEFORE BREAKFAST 90 tablet 3   Melatonin 10 MG TABS Take 10 mg by mouth at bedtime as needed (sleep).      simvastatin  (ZOCOR ) 20 MG tablet TAKE 1 TABLET BY MOUTH ONCE DAILY AT BEDTIME 90 tablet 3   dapagliflozin  propanediol (FARXIGA ) 10 MG TABS tablet Take 1 tablet (10 mg total) by mouth daily before breakfast. (Patient not taking: Reported on 11/17/2023) 90 tablet 3   No current facility-administered medications for this encounter.    Allergies:   Atorvastatin, Crestor [rosuvastatin calcium ], Enalapril , Lisinopril, Losartan , and Telmisartan    Social History:  The patient  reports that he quit smoking about 16 years ago. His smoking use included cigarettes. He started smoking about 63 years ago. He has a 47 pack-year smoking history. He has never been exposed to tobacco smoke. He has never used smokeless tobacco. He reports current alcohol  use. He reports that he does not use drugs.   Family History:  The patient's family history includes Gastric cancer in his mother; Heart attack (age of onset: 1) in his brother; Peripheral vascular disease in his father; Throat cancer in his father.   ROS:  Please see the history of present illness.   All other systems are personally reviewed and negative.   Exam:   BP 104/60   Pulse 61   Wt 65.4 kg (144 lb 3.2 oz)   SpO2 99%  BMI 21.29 kg/m  General: NAD Neck: JVP 8 cm, no thyromegaly or thyroid  nodule.  Lungs: Clear to auscultation bilaterally with normal respiratory effort. CV: Nondisplaced PMI.  Heart regular S1/S2, no S3/S4, no murmur.  1+ ankle edema.  No carotid bruit.  Normal pedal pulses.  Abdomen: Soft, nontender, no hepatosplenomegaly, no distention.  Skin: Intact without lesions or rashes.  Neurologic: Alert and oriented x 3.  Psych: Normal affect. Extremities: No clubbing or cyanosis.  HEENT: Normal.   Recent Labs: 02/25/2023: B Natriuretic Peptide 1,104.4 10/31/2023: Hemoglobin 12.1; Platelets 187 11/17/2023: ALT 15; BUN 41; Creatinine, Ser 2.51; Potassium 4.2; Sodium 138; TSH 17.203  Personally reviewed   Wt Readings from Last 3 Encounters:  11/17/23 65.4 kg (144 lb 3.2 oz)  10/31/23 63.4 kg (139 lb 12.8 oz)  10/02/23 61.6 kg (135 lb 12.8 oz)     ASSESSMENT AND PLAN:  1. Chronic systolic CHF: Ischemic cardiomyopathy.  Medtronic CRT-D device, baroreceptor activation therapy device.  Echo in 6/22 with EF 30-35%, mild RV dysfunction. Upgraded to CRT device given high percentage of RV pacing.  Echo 2/24 showed EF 25-30% with anterolateral/inferolateral akinesis, basal-mid inferior akinesis, mild RV dysfunction, mild-moderate MR.  CPX in 10/24 was submaximal but suggestive of severe HF limitation.  RHC In 11/24 showed normal filling pressures with low, but not markedly low, CI (2.11 Fick, 2.35 thermo). Pulmonary fibrosis plays a role in his symptoms.  CKD limits  medication titration. NYHA class III symptoms, worse recently.  Suspect worsening is due to atrial fibrillation/flutter.  He is mildly volume overloaded by exam and Optivol. He has been off Farxiga .  - Continue Coreg  12.5 mg bid.       - Continue Lasix  40 qam/20 qpm but will restart Farxiga  10 mg daily.  This should help increase diuresis.  He will hold Farxiga  3 days prior to DCCV.   - Continue eplerenone  25 mg daily. Increase in future if creatinine remains stable.   - He is off valsartan  for now with elevated creatinine, may be able to start back on low dose in the future.    - We need to get him back into NSR, see below. - He would be a difficult LVAD candidate with CKD and his lung disease (pulmonary fibrosis).  He says that he would not be interested in open heart surgery and also does not have anyone who could stay with him as a caregiver post-op.  RHC in 10/24 did not show markedly low output though CPX showed a severe functional limitation.  2. CAD: Denies chest pain.  Had DES x 3 to ramus in 3/18 and DES to OM1 in 9/21.   - Continue Zocor , check lipids today.  - He is now off Plavix  due to Eliquis  use.   3. Hyperlipidemia: Stable on simvastatin  without myalgias.  Has not been able to tolerate higher dose of simvastatin  or other statins. Unable to tolerate Zetia  due to constipation.  - Check lipids today.  4. Hx of VT: Has been on amiodarone  100 mg daily. Has ICD.  He did not tolerate sotalol .  - Check LFTs with amiodarone  use.  He is on Levoxyl  for amiodarone -induced mild hypothyroidism, check TSH.   - Knows to get a yearly eye exam   - See below regarding ?amiodarone  lung toxicity.  5. PAD: He denies claudication.  Stable ABIs 2/24 at [facility]. 6. AAA: 3.4 cm in 2/24, followed by [facility].  7. CKD: Stage 3.  Last creatinine 2.2.  - Follows with  nephrology now.    - BMET today.  8. Pulmonary: PFTs were done with increased dyspnea and amiodorone use, concerning for ILD/pulmonary fibrosis.  High  resolution CT chest was concerning for ILD.  I have worried about amiodarone -induced lung toxicity. 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  He was offered antifibrotic treatment but wants to hold off for now. I think that some of his symptomatology is due to IPF.  - Can continue amiodarone  100 mg daily as above for VT suppression.  - Pulmonary followup with Dr. Theophilus.   9. Atrial fibrillation/flutter: He has been in persistent AF/AFL for a couple of months. He is in AFL today.  Onset of AF/AFL seems to have led to CHF decompensation with worsening fatigue.  We need to get him back in NSR.  - He will have DCCV next week, after 1 month of uninterrupted Eliquis .  We discussed risks/benefits and he agrees to procedure.  - Continue low dose amiodarone .  Will not increase with concern for amiodarone  lung toxicity.   Followup 1 month  I spent 42 minutes reviewing records, interviewing/examining patient, and managing orders.   Signed, Nathan Shuck, MD  11/17/2023   [facility] [address] [facility] [place] [address] [phone] (office) [phone] (fax)

## 2023-11-17 NOTE — Telephone Encounter (Signed)
 [facility] Patient Advocate Encounter  Test billing for this patients current coverage ([employer] MPD) returned the following pricing for Pradaxa 150 mg bid:  Brand Pradaxa $48.40 for 30 days Generic dabigatran $39.77 for 30 days  Rachel DEL, CPhT Rx Patient Advocate Phone: [phone]

## 2023-11-17 NOTE — Patient Instructions (Signed)
 RESTART your Farxiga , please make sure to stop it 3 days prior to your Cardioversion.  INCREASE Amiodarone  200 mg daily until your cardioversion.  Labs done today, your results will be available in MyChart, we will contact you for abnormal readings.  Your physician has requested that you have an echocardiogram. Echocardiography is a painless test that uses sound waves to create images of your heart. It provides your doctor with information about the size and shape of your heart and how well your heart's chambers and valves are working. This procedure takes approximately one hour. There are no restrictions for this procedure. Please do NOT wear cologne, perfume, aftershave, or lotions (deodorant is allowed). Please arrive 15 minutes prior to your appointment time.  Please note: We ask at that you not bring children with you during ultrasound (echo/ vascular) testing. Due to room size and safety concerns, children are not allowed in the ultrasound rooms during exams. Our front office staff cannot provide observation of children in our lobby area while testing is being conducted. An adult accompanying a patient to their appointment will only be allowed in the ultrasound room at the discretion of the ultrasound technician under special circumstances. We apologize for any inconvenience.  Your physician recommends that you schedule a follow-up appointment in: as scheduled.  If you have any questions or concerns before your next appointment please send us  a message through [place] or call our office at [phone].    TO LEAVE A MESSAGE FOR THE NURSE SELECT OPTION 2, PLEASE LEAVE A MESSAGE INCLUDING: YOUR NAME DATE OF BIRTH CALL BACK NUMBER REASON FOR CALL**this is important as we prioritize the call backs  YOU WILL RECEIVE A CALL BACK THE SAME DAY AS LONG AS YOU CALL BEFORE 4:00 PM  At the [facility], you and your health needs are our priority. As part of our continuing mission  to provide you with exceptional heart care, we have created designated Provider Care Teams. These Care Teams include your primary Cardiologist (physician) and Advanced Practice Providers (APPs- Physician Assistants and Nurse Practitioners) who all work together to provide you with the care you need, when you need it.   You may see any of the following providers on your designated Care Team at your next follow up: Dr Toribio Fuel Dr [name] Dr. Ria Commander Dr. Morene Brownie Amy Lenetta, NP Caffie Shed, [place] [facility] [place], [place] Beckey Coe, NP [place] Lee, NP Ellouise Class, NP Tinnie Redman, PharmD Jaun Bash, PharmD   Please be sure to bring in all your medications bottles to every appointment.    Thank you for choosing [facility]

## 2023-11-17 NOTE — Progress Notes (Signed)
 ID:  Nathan Pruitt, DOB [birth year], MRN [id number]   Provider location: [facility] Type of Visit: Established patient   PCP:  Ina Marcellus RAMAN, MD  Cardiologist:  [name], MD  Chief complaint: Fatigue  History of Present Illness: Nathan Pruitt is a 78 y.o. male with a history of history of CAD, PAD, ischemic CMP, and VT s/p ICD.    Echo in 2/17 showed EF 40% and regional WMAs.   In 3/18, he developed episodes of VT as well as increased dyspnea.  LHC/RHC was done, showing new occlusion of a large ramus (CTO, probably a month or so old at time of cath) and old CTO RCA with collaterals.  He had DES x 3 to ramus.  Filling pressures were optimized on RHC.  Amiodarone  was increased to 200 mg daily from 100 mg daily.  No VT since PCI.    Echo in 6/19 showed EF 35-40% with wall motion abnormalities. Echo in 1/21 showed EF 40-45% with basal to mid inferolateral and inferior akinesis, mild MR, normal RV, PASP 35 mmHg.   LHC/RHC in 9/21 with normal filling pressures and cardiac output but 95% OM1 stenosis.  This was treated with DES to OM1.   He has been following with Dr. Theophilus with idiopathic pulmonary fibrosis.  I was concerned that he could have amiodarone  -related lung toxicity but Dr. Theophilus did not think this was very likely.  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     Labs drawn in 1/22 showed K up to 5.9 and creatinine 4.  He was sent to the ER at [facility].  He was found to have COVID-19 and was admitted. He had a productive cough and sore throat.  He was thought to be dehydrated and got IV fluid.  Creatinine 2.3 at discharge.  Lasix , valsartan , and eplerenone  were stopped.  Other meds were continued.  Echo in 6/22 showed EF 30-35%, basal-mid inferior and inferolateral akinesis, anterolateral hypokinesis, moderate MR (appears infarct-related), mildly decreased RV function.   In 6/23, he had upgrade of device to CRT-D  given frequent RV pacing.  Echo in 2/24 showed EF 25-30% with anterolateral/inferolateral akinesis, basal-mid inferior akinesis, mild RV dysfunction, mild-moderate MR.   CPX in 10/24 was a submaximal study but probably severe HF limitation as well as deconditioning.   RHC in 11/24 showed normal filling pressures and CI 2.11 by Fick, 2.35 by thermodilution.   In 2/25, patient had baroreceptor activation therapy placed.   Patient was noted to go into atrial fibrillation/flutter by device interrogation.  He was started on Eliquis .   He returns today for followup of CHF and atrial fibrillation.  Since going into atrial fibrillation/flutter, he has been symptomatically worse.  He feels wobbly with poor balance.  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  Symptoms worse x about 1 month.  He has the same exertional dyspnea walking up a hill but the tiredness is the new finding.  He has been off Farxiga , not sure why.  He does not get insurance coverage for Eliquis , currently taking it from a 30 free supply. Weight is up 2 lbs.   ECG (personally reviewed): Atrial flutter, BiV pacing  Medtronic device interrogation: 95% BiV pacing, persistent AF/AFL, decreased thoracic impedance.    Labs (9/23): hgb 11.2, K 4.7, creatinine 2.2, LFTs normal, transferrin saturation 21% Labs (1/24): K 4.5, creatinine 2.3, LFTs normal, TSH slightly elevated  Labs (8/24): K 4.8, creatinine 2.3, LFTs normal, LDL 70 Labs (12/24): K 4.1, creatinine 1.9 Labs (6/25): K 4.3, creatinine 2.22, hgb 12.1   Allergies (verified):  No Known Drug Allergies   Past Medical History: 1. Coronary artery disease. The patient reports history of silent MI in 1993.  This was likely an inferior MI (see below).  - The patient presented to [facility] in [facility]/10 with VT and mildly elevated cardiac enzymes. LHC (5/10):  Inferobasal dyskinesis with EF 35-40%.  There was chronic total occlusion of the mid RCA with good collaterals.   Luminals LCA.  This did not appear to be an acute cause of the 5/10 event.  - LHC (3/18): Known occlusion of RCA with collaterals, new occlusion of ramus.  DES x 3 to ramus.  - LHC (9/21): Known occlusion RCA with collaterals, 95% OMI treated with DES.  2. Hypertension.  3. Hyperlipidemia: Intolerant of higher doses of simvastatin , Crestor, Lipitor, pravastatin . Zetia  caused constipation.  4. Remote tobacco abuse with 47-pack-year history, quitting in August 2009.  5. Peripheral arterial disease.  - Status post left-to-right fem-fem bypass performed in [place] in March 2009.  - Status post redo left-to-right fem-fem bypass performed by Dr. Eliza at [facility] in 2009.  - ABIs normal 3/15, ABIs normal 3/16, ABIs normal 3/17, ABIs normal 4/18, ABIs normal 4/19, ABIs normal 12/20. ABIs stable 3/24.  6. Rheumatoid arthritis, on leflunomide .  7.  Ischemic cardiomyopathy:  EF 35-40% by LV-gram 5/10 with inferobasal dyskinesis.  Echo 5/10 showed EF 40% with mild LVH, no significant MR, inferobasal and posterobasal akinesis.  Echo (7/11): EF 50%, mild LVH, basal-mid inferoposterior akinesis.  Echo (7/12): EF 45-50% with basal anterolateral, basal posterior, and basal to mid inferior akinesis.  Echo (4/16): EF 40-45%, basal to mid inferolateral AK, basal inferior AK, basal to mid anterolateral HK.  Echo (2/17): EF 40% with basal to mid inferior akinesis, basal inferolateral aneurysm, mid inferolateral akinesis, basal anterolateral hypokinesis, normal RV size and systolic function, PASP 25 mmHg.  - Echo (3/18) with EF 30-35%, moderate LV dilation, moderate MR, mildly decreased RV systolic function, PASP 51 mmmHg. - RHC (3/18): mean RA 4, PA 38/12, mean PCWP 17, CI 2.2.  - Echo (6/19): EF 35-40%, inferior/inferolateral/anterolateral WMAs, moderate MR likely infarct-related, normal RV size with mildly decreased systolic function.  - Echo (1/21): EF 35-40% with wall motion abnormalities. Echo was done today and  reviewed, EF 40-45% with basal to mid inferolateral and inferior akinesis, mild MR, normal RV, PASP 35 mmHg.  - RHC (9/21): mean RA 4, PA 33/12, mean PCWP 9, CI 3.12 - Echo (6/22): EF 30-35%, basal-mid inferior and inferolateral akinesis, anterolateral hypokinesis, moderate MR (appears infarct-related), mildly decreased RV function.  - CRT-D upgrade (Medtronic) in 6/23.  - Echo (2/24): EF 25-30% with anterolateral/inferolateral akinesis, basal-mid inferior akinesis, mild RV dysfunction, mild-moderate MR. - CPX (10/24): submaximal with RER 1.04, peak VO2 11.6, VE/VCO2 slope 54; severe HF limitation, also with deconditioning.  - RHC (11/24): mean RA 2, PA 31/7, mean PCWP 11, CI 2.11 Fick/2.35 thermodilution.  - Baroreceptor activation therapy 2/25.  8.  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  VT storm 5/10 suppressed by amiodarone  and Coreg .  He has a Medtronic CRT-D device.  9.  Atrial flutter:  Status post isthmus ablation 5/10.   10. Restrictive lung defect: PFTs (7/11): FVC 74%, FEV1 80%, ratio 75%, TLC 78%, DLCO 68%.  This suggests a mild restrictive defect and a mild obstructive defect.  He  did have response to bronchodilator. These PFTs were significantly better than the report from Dr. Mardee in [place] done prior.  He had last PFTs in 2/12 with no significant change.  - PFTs (9/21) with significantly decreased DLCO and concern for ILD/pulmonary fibrosis.  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  11.  Anxiety 12.  Chronic cough: No relief with change from ACEI to ARB or with trial of PPI.  13.  CKD: Stage 3.  14. Mitral regurgitation: Moderate on 6/22 echo, suspect infarct-related.  15. AAA: 3.1 cm AAA on abdominal US  12/20  - Abdominal US  5/22 with 3.1 cm AAA - Abdominal US  2/24 with 3.4 cm AAA 16. COVID-19 infection: 1/22.  17. Atrial fibrillation/flutter: Persistent 18. Fe deficiency anemia  Current Outpatient Medications  Medication Sig  Dispense Refill   acetaminophen  (TYLENOL ) 500 MG tablet Take 1,000 mg by mouth every 6 (six) hours as needed for moderate pain (pain score 4-6).     amiodarone  (PACERONE ) 200 MG tablet Take 0.5 tablets (100 mg total) by mouth daily. 90 tablet 1   apixaban  (ELIQUIS ) 5 MG TABS tablet Take 1 tablet (5 mg total) by mouth 2 (two) times daily.     carvedilol  (COREG ) 12.5 MG tablet Take 1 tablet by mouth twice daily 180 tablet 3   chlorhexidine  (PERIDEX ) 0.12 % solution Use as directed 15 mLs in the mouth or throat daily as needed (irritation).   3   cholecalciferol (VITAMIN D3) 25 MCG (1000 UNIT) tablet Take 1,000 Units by mouth daily.     eplerenone  (INSPRA ) 25 MG tablet Take 1 tablet by mouth once daily 90 tablet 0   fluticasone  (FLONASE ) 50 MCG/ACT nasal spray Place 1-2 sprays into both nostrils daily as needed for allergies or rhinitis.     furosemide  (LASIX ) 40 MG tablet Take 40 mg in am and 20 mg in pm daily 135 tablet 3   leflunomide  (ARAVA ) 20 MG tablet Take 20 mg by mouth daily.     levothyroxine  (SYNTHROID ) 25 MCG tablet TAKE 1 TABLET BY MOUTH ONCE  DAILY BEFORE BREAKFAST 90 tablet 3   Melatonin 10 MG TABS Take 10 mg by mouth at bedtime as needed (sleep).      simvastatin  (ZOCOR ) 20 MG tablet TAKE 1 TABLET BY MOUTH ONCE DAILY AT BEDTIME 90 tablet 3   dapagliflozin  propanediol (FARXIGA ) 10 MG TABS tablet Take 1 tablet (10 mg total) by mouth daily before breakfast. (Patient not taking: Reported on 11/17/2023) 90 tablet 3   No current facility-administered medications for this encounter.    Allergies:   Atorvastatin, Crestor [rosuvastatin calcium ], Enalapril , Lisinopril, Losartan , and Telmisartan    Social History:  The patient  reports that he quit smoking about 16 years ago. His smoking use included cigarettes. He started smoking about 63 years ago. He has a 47 pack-year smoking history. He has never been exposed to tobacco smoke. He has never used smokeless tobacco. He reports current alcohol  use. He reports that he does not use drugs.   Family History:  The patient's family history includes Gastric cancer in his mother; Heart attack (age of onset: 1) in his brother; Peripheral vascular disease in his father; Throat cancer in his father.   ROS:  Please see the history of present illness.   All other systems are personally reviewed and negative.   Exam:   BP 104/60   Pulse 61   Wt 65.4 kg (144 lb 3.2 oz)   SpO2 99%  BMI 21.29 kg/m  General: NAD Neck: JVP 8 cm, no thyromegaly or thyroid  nodule.  Lungs: Clear to auscultation bilaterally with normal respiratory effort. CV: Nondisplaced PMI.  Heart regular S1/S2, no S3/S4, no murmur.  1+ ankle edema.  No carotid bruit.  Normal pedal pulses.  Abdomen: Soft, nontender, no hepatosplenomegaly, no distention.  Skin: Intact without lesions or rashes.  Neurologic: Alert and oriented x 3.  Psych: Normal affect. Extremities: No clubbing or cyanosis.  HEENT: Normal.   Recent Labs: 02/25/2023: B Natriuretic Peptide 1,104.4 10/31/2023: Hemoglobin 12.1; Platelets 187 11/17/2023: ALT 15; BUN 41; Creatinine, Ser 2.51; Potassium 4.2; Sodium 138; TSH 17.203  Personally reviewed   Wt Readings from Last 3 Encounters:  11/17/23 65.4 kg (144 lb 3.2 oz)  10/31/23 63.4 kg (139 lb 12.8 oz)  10/02/23 61.6 kg (135 lb 12.8 oz)     ASSESSMENT AND PLAN:  1. Chronic systolic CHF: Ischemic cardiomyopathy.  Medtronic CRT-D device, baroreceptor activation therapy device.  Echo in 6/22 with EF 30-35%, mild RV dysfunction. Upgraded to CRT device given high percentage of RV pacing.  Echo 2/24 showed EF 25-30% with anterolateral/inferolateral akinesis, basal-mid inferior akinesis, mild RV dysfunction, mild-moderate MR.  CPX in 10/24 was submaximal but suggestive of severe HF limitation.  RHC In 11/24 showed normal filling pressures with low, but not markedly low, CI (2.11 Fick, 2.35 thermo). Pulmonary fibrosis plays a role in his symptoms.  CKD limits  medication titration. NYHA class III symptoms, worse recently.  Suspect worsening is due to atrial fibrillation/flutter.  He is mildly volume overloaded by exam and Optivol. He has been off Farxiga .  - Continue Coreg  12.5 mg bid.       - Continue Lasix  40 qam/20 qpm but will restart Farxiga  10 mg daily.  This should help increase diuresis.  He will hold Farxiga  3 days prior to DCCV.   - Continue eplerenone  25 mg daily. Increase in future if creatinine remains stable.   - He is off valsartan  for now with elevated creatinine, may be able to start back on low dose in the future.    - We need to get him back into NSR, see below. - He would be a difficult LVAD candidate with CKD and his lung disease (pulmonary fibrosis).  He says that he would not be interested in open heart surgery and also does not have anyone who could stay with him as a caregiver post-op.  RHC in 10/24 did not show markedly low output though CPX showed a severe functional limitation.  2. CAD: Denies chest pain.  Had DES x 3 to ramus in 3/18 and DES to OM1 in 9/21.   - Continue Zocor , check lipids today.  - He is now off Plavix  due to Eliquis  use.   3. Hyperlipidemia: Stable on simvastatin  without myalgias.  Has not been able to tolerate higher dose of simvastatin  or other statins. Unable to tolerate Zetia  due to constipation.  - Check lipids today.  4. Hx of VT: Has been on amiodarone  100 mg daily. Has ICD.  He did not tolerate sotalol .  - Check LFTs with amiodarone  use.  He is on Levoxyl  for amiodarone -induced mild hypothyroidism, check TSH.   - Knows to get a yearly eye exam   - See below regarding ?amiodarone  lung toxicity.  5. PAD: He denies claudication.  Stable ABIs 2/24 at [facility]. 6. AAA: 3.4 cm in 2/24, followed by [facility].  7. CKD: Stage 3.  Last creatinine 2.2.  - Follows with  nephrology now.    - BMET today.  8. Pulmonary: PFTs were done with increased dyspnea and amiodorone use, concerning for ILD/pulmonary fibrosis.  High  resolution CT chest was concerning for ILD.  I have worried about amiodarone -induced lung toxicity. 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  He was offered antifibrotic treatment but wants to hold off for now. I think that some of his symptomatology is due to IPF.  - Can continue amiodarone  100 mg daily as above for VT suppression.  - Pulmonary followup with Dr. Theophilus.   9. Atrial fibrillation/flutter: He has been in persistent AF/AFL for a couple of months. He is in AFL today.  Onset of AF/AFL seems to have led to CHF decompensation with worsening fatigue.  We need to get him back in NSR.  - He will have DCCV next week, after 1 month of uninterrupted Eliquis .  We discussed risks/benefits and he agrees to procedure.  - Continue low dose amiodarone .  Will not increase with concern for amiodarone  lung toxicity.   Followup 1 month  I spent 42 minutes reviewing records, interviewing/examining patient, and managing orders.   Signed, [name], MD  11/17/2023   [facility] [address] [facility] [place] [address] [phone] (office) [phone] (fax)

## 2023-11-20 ENCOUNTER — Other Ambulatory Visit (HOSPITAL_COMMUNITY): Payer: Self-pay

## 2023-11-20 ENCOUNTER — Telehealth (HOSPITAL_COMMUNITY): Payer: Self-pay | Admitting: Pharmacy Technician

## 2023-11-20 NOTE — Telephone Encounter (Signed)
 [facility] Patient Advocate Encounter  Patient called in and requested that Pradaxa be sent to for after his procedure. Sent 90 day RX request to Emer Banker) to send to [employer].   He also had a question on how much Amiodarone  he should be on now. I could not tell whether he was to increase to 200mg  or stay at 100mg . Routing to Emer to help assess.  Almarie JULIANNA Pa, CPhT

## 2023-11-21 ENCOUNTER — Other Ambulatory Visit (HOSPITAL_COMMUNITY): Payer: Self-pay

## 2023-11-21 MED ORDER — DABIGATRAN ETEXILATE MESYLATE 75 MG PO CAPS: 75.0000 mg | ORAL_CAPSULE | Freq: Two times a day (BID) | ORAL | 3 refills | Status: DC

## 2023-11-21 MED ORDER — LEVOTHYROXINE SODIUM 50 MCG PO TABS: 50.0000 ug | ORAL_TABLET | Freq: Every day | ORAL | 3 refills | Status: DC

## 2023-11-21 NOTE — Telephone Encounter (Signed)
 With GFR < 30, I think his dose of Pradaxa should be 75 mg bid.

## 2023-11-21 NOTE — Addendum Note (Signed)
 Encounter addended by: Marcelina Lisa HERO, RN on: 11/21/2023 10:09 AM  Actions taken: Clinical Note Signed

## 2023-11-21 NOTE — Telephone Encounter (Signed)
 Pt aware, agreeable, and verbalized understanding  Rx has been sent for 90 days

## 2023-11-21 NOTE — Telephone Encounter (Addendum)
 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  Would repeat TSH and get free T3 and free T4.  Can do in [place].  Creatinine up, hopefully this will get better after  cardioversion.  ----- Message ----- From: Interface, Lab In [place] Sent: 11/17/2023  11:01 AM EDT To: Ezra GORMAN Shuck, MD

## 2023-11-21 NOTE — Progress Notes (Signed)
 Pt aware, agreeable, and verbalized understanding  New Rx sentt

## 2023-11-24 NOTE — Progress Notes (Signed)
 Spoke to patient and instructed them to come at 0830  and to be NPO after 0000.     Confirmed that patient will have a ride home and someone to stay with them for 24 hours after the procedure.  Medications reviewed.  Confirmed blood thinner.  Confirmed no breaks in taking blood thinner for 3+ weeks prior to procedure. Confirmed patient stopped all GLP-1s and GLP-2s for at least one week before procedure.

## 2023-11-24 NOTE — Anesthesia Preprocedure Evaluation (Signed)
 Anesthesia Evaluation  Patient identified by MRN, date of birth, ID band Patient awake    Reviewed: Allergy & Precautions, H&P , NPO status , Patient's Chart, lab work & pertinent test results  Airway Mallampati: II  TM Distance: >3 FB Neck ROM: Full    Dental no notable dental hx. (+) Dental Advisory Given   Pulmonary former smoker Pulmonary fibrosis   Pulmonary exam normal breath sounds clear to auscultation       Cardiovascular hypertension, + CAD, + Past MI, + Peripheral Vascular Disease and +CHF  + dysrhythmias Atrial Fibrillation + Cardiac Defibrillator  Rhythm:Regular Rate:Normal + Systolic murmurs Cath 03/2023 1. Normal filling pressures.  2. Normal PA pressure 3. Low but not markedly low cardiac output.     Echo 06/2022  1. Left ventricular ejection fraction, by estimation, is 25 to 30%. The left ventricle has severely decreased function. The left ventricle demonstrates regional wall motion abnormalities with inferolateral and anterolateral akinesis, basal to mid inferior akinesis. The left ventricular internal cavity size was moderately dilated. There is mild concentric left ventricular hypertrophy. Left ventricular diastolic parameters are consistent with Grade I diastolic dysfunction (impaired relaxation).   2. Right ventricular systolic function is mildly reduced. The right ventricular size is normal. There is normal pulmonary artery systolic pressure. The estimated right ventricular systolic pressure is 34.8 mmHg.   3. Left atrial size was moderately dilated.   4. The mitral valve is normal in structure. Mild to moderate mitral valve regurgitation. No evidence of mitral stenosis.   5. The aortic valve is tricuspid. There is mild calcification of the aortic valve. Aortic valve regurgitation is mild. No aortic stenosis is present.   6. The inferior vena cava is normal in size with greater than 50% respiratory variability,  suggesting right atrial pressure of 3 mmHg.     Neuro/Psych  Headaches PSYCHIATRIC DISORDERS Anxiety     Cervical radiculopathy    GI/Hepatic negative GI ROS, Neg liver ROS,,,  Endo/Other  negative endocrine ROS    Renal/GU CRFRenal disease  negative genitourinary   Musculoskeletal  (+) Arthritis ,    Abdominal   Peds negative pediatric ROS (+)  Hematology negative hematology ROS (+)   Anesthesia Other Findings   Reproductive/Obstetrics negative OB ROS                              Anesthesia Physical Anesthesia Plan  ASA: 4  Anesthesia Plan: General   Post-op Pain Management:    Induction: Intravenous  PONV Risk Score and Plan: 2 and Ondansetron  and TIVA  Airway Management Planned:   Additional Equipment:   Intra-op Plan:   Post-operative Plan:   Informed Consent: I have reviewed the patients History and Physical, chart, labs and discussed the procedure including the risks, benefits and alternatives for the proposed anesthesia with the patient or authorized representative who has indicated his/her understanding and acceptance.     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  Last seen by Harlene  Mobridge, FNP on 06/06/2023.  Stable from cardiac standpoint.  Per note, Echo 2/24 showed EF 25-30% with anterolateral/inferolateral akinesis, basal-mid inferior akinesis, mild RV dysfunction, mild-moderate MR.  CPX in 10/24 was submaximal but suggestive of severe HF limitation.   RHC In 11/24 showed normal filling pressures with low, but not markedly low, CI (2.11 Fick, 2.35 thermo). Chronic NYHA class III symptoms, slowly worsening with prominent fatigue.  Pulmonary fibrosis plays a role in his symptoms as well.  Today, he is not volume overloaded on exam or by OptiVol. CKD limits medication titration.  No changes made to management, discussed upcoming Barostim placement.  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  He was offered antifibrotic treatment but wanted to hold off.  PFTs 07/23/2021: FVC 3.47 [82%], FEV1 2.72 [89%], F/F 78, TLC 5.60 [79%], DLCO 12.15 [48%] Minimal restriction moderate-severe diffusion defect  Last seen by Dr. Theophilus 07/25/2022, pulmonary fibrosis was felt stable at that time.  Recommended 1 year follow-up with HRCT.  Other pertinent history includes CKD 3, rheumatoid arthritis on leflunomide .  Preop labs reviewed, creatinine elevated at 1.98 consistent with history of CKD 3, otherwise unremarkable.  EKG 04/24/2023: AV dual paced rhythm.  Rate 64.  Perioperative prescription for implanted cardiac device programming per progress note 06/19/2023: Device Information:  Clinic EP Physician:  Soyla Norton, MD   Device Type:  Defibrillator Manufacturer and Phone #:  Medtronic: [phone] Pacemaker Dependent?:  Yes.   Date of Last Device Check:  02/03/2023 IN OFFICE            Normal Device Function?:  Yes.    Electrophysiologist's Recommendations:   Have magnet available.  Provide continuous ECG monitoring when magnet is used or reprogramming is to be performed.   Procedure will likely interfere with device function.  Device should be programmed:  Tachy therapies disabled and Asynchronous pacing during procedure and returned to normal programming after procedure (DEFER TO Industry presence and guidance during procedure.)   HRCT chest 03/07/2020: IMPRESSION: 1. The appearance of the lungs is compatible with interstitial  lung disease, with a spectrum of findings categorized as probable usual interstitial pneumonia (UIP) per current ATS guidelines. 2. Aortic atherosclerosis, in addition to left main and 3 vessel coronary artery disease. Assessment for potential risk factor modification, dietary therapy or pharmacologic therapy may be warranted, if clinically indicated. 3. There are calcifications of the aortic valve and mitral annulus. Echocardiographic correlation for evaluation of potential valvular dysfunction may be warranted if clinically indicated.  Right heart cath 03/18/2023: 1. Normal filling pressures.  2. Normal PA pressure 3. Low but not markedly low cardiac output.   Continue barostimulator evaluation with Dr. Serene.   Carotid duplex 02/23/2023: Summary:  Right Carotid: Velocities in the right ICA are consistent with a 60-79%  stenosis.  Left Carotid: Velocities in the left ICA are consistent with a 60-79% stenosis.  Vertebrals:Bilateral vertebral arteries demonstrate antegrade flow.  Subclavians: Normal flow hemodynamics were seen in bilateral subclavian  arteries.   TTE 06/11/2022: 1. Left ventricular ejection fraction, by estimation, is 25 to 30%. The  left ventricle has severely decreased function. The left ventricle  demonstrates regional wall motion abnormalities with inferolateral and  anterolateral akinesis, basal to mid  inferior akinesis. The left ventricular internal cavity size was  moderately dilated. There is mild concentric left ventricular hypertrophy.  Left ventricular diastolic parameters are consistent with Grade I  diastolic dysfunction (impaired relaxation).  2. Right ventricular systolic function is mildly reduced. The right  ventricular size is normal. There is normal pulmonary artery systolic  pressure. The estimated right ventricular systolic pressure is 34.8 mmHg.  3. Left atrial size was moderately dilated.  4. The mitral valve is normal in structure.  Mild to moderate mitral valve  regurgitation. No evidence of mitral stenosis.  5. The aortic valve is tricuspid. There is mild calcification of the  aortic valve. Aortic valve regurgitation is mild. No aortic stenosis is  present.  6. The inferior vena cava is normal in size with greater than 50%  respiratory variability, suggesting right atrial pressure of 3 mmHg.    )         Anesthesia Quick Evaluation

## 2023-11-25 ENCOUNTER — Other Ambulatory Visit: Payer: Self-pay

## 2023-11-25 ENCOUNTER — Encounter (HOSPITAL_COMMUNITY)
Admission: RE | Disposition: A | Discharge status: Home / Self Care | Payer: Self-pay | Source: Home / Self Care | Attending: Cardiology

## 2023-11-25 ENCOUNTER — Encounter (HOSPITAL_COMMUNITY): Payer: Self-pay | Admitting: Cardiology

## 2023-11-25 ENCOUNTER — Ambulatory Visit (HOSPITAL_COMMUNITY)
Admission: RE | Admit: 2023-11-25 | Discharge: 2023-11-25 | Disposition: A | Discharge status: Home / Self Care | Attending: Cardiology | Admitting: Cardiology

## 2023-11-25 ENCOUNTER — Ambulatory Visit (HOSPITAL_BASED_OUTPATIENT_CLINIC_OR_DEPARTMENT_OTHER): Admitting: Anesthesiology

## 2023-11-25 ENCOUNTER — Ambulatory Visit (HOSPITAL_COMMUNITY): Admitting: Anesthesiology

## 2023-11-25 DIAGNOSIS — I739 Peripheral vascular disease, unspecified: Secondary | ICD-10-CM | POA: Diagnosis not present

## 2023-11-25 DIAGNOSIS — Z95810 Presence of automatic (implantable) cardiac defibrillator: Secondary | ICD-10-CM | POA: Diagnosis not present

## 2023-11-25 DIAGNOSIS — J8410 Pulmonary fibrosis, unspecified: Secondary | ICD-10-CM | POA: Diagnosis not present

## 2023-11-25 DIAGNOSIS — I110 Hypertensive heart disease with heart failure: Secondary | ICD-10-CM | POA: Diagnosis not present

## 2023-11-25 DIAGNOSIS — I5022 Chronic systolic (congestive) heart failure: Secondary | ICD-10-CM | POA: Diagnosis not present

## 2023-11-25 DIAGNOSIS — I255 Ischemic cardiomyopathy: Secondary | ICD-10-CM | POA: Insufficient documentation

## 2023-11-25 DIAGNOSIS — Z87891 Personal history of nicotine dependence: Secondary | ICD-10-CM

## 2023-11-25 DIAGNOSIS — I472 Ventricular tachycardia, unspecified: Secondary | ICD-10-CM | POA: Insufficient documentation

## 2023-11-25 DIAGNOSIS — I252 Old myocardial infarction: Secondary | ICD-10-CM | POA: Diagnosis not present

## 2023-11-25 DIAGNOSIS — I4819 Other persistent atrial fibrillation: Secondary | ICD-10-CM | POA: Insufficient documentation

## 2023-11-25 DIAGNOSIS — E785 Hyperlipidemia, unspecified: Secondary | ICD-10-CM | POA: Insufficient documentation

## 2023-11-25 DIAGNOSIS — I130 Hypertensive heart and chronic kidney disease with heart failure and stage 1 through stage 4 chronic kidney disease, or unspecified chronic kidney disease: Secondary | ICD-10-CM | POA: Insufficient documentation

## 2023-11-25 DIAGNOSIS — Z7901 Long term (current) use of anticoagulants: Secondary | ICD-10-CM | POA: Insufficient documentation

## 2023-11-25 DIAGNOSIS — Z8679 Personal history of other diseases of the circulatory system: Secondary | ICD-10-CM | POA: Insufficient documentation

## 2023-11-25 DIAGNOSIS — M069 Rheumatoid arthritis, unspecified: Secondary | ICD-10-CM | POA: Insufficient documentation

## 2023-11-25 DIAGNOSIS — Z79899 Other long term (current) drug therapy: Secondary | ICD-10-CM | POA: Insufficient documentation

## 2023-11-25 DIAGNOSIS — I4891 Unspecified atrial fibrillation: Secondary | ICD-10-CM

## 2023-11-25 DIAGNOSIS — I2510 Atherosclerotic heart disease of native coronary artery without angina pectoris: Secondary | ICD-10-CM | POA: Insufficient documentation

## 2023-11-25 DIAGNOSIS — N183 Chronic kidney disease, stage 3 unspecified: Secondary | ICD-10-CM | POA: Diagnosis not present

## 2023-11-25 HISTORY — PX: CARDIOVERSION: EP1203

## 2023-11-25 SURGERY — CARDIOVERSION (CATH LAB)
Anesthesia: General

## 2023-11-25 MED ORDER — LIDOCAINE 2% (20 MG/ML) 5 ML SYRINGE
INTRAMUSCULAR | Status: DC | PRN
  Administered 2023-11-25: 40 mg via INTRAVENOUS

## 2023-11-25 MED ORDER — SODIUM CHLORIDE 0.9% FLUSH: 3.0000 mL | Freq: Two times a day (BID) | INTRAVENOUS | Status: DC

## 2023-11-25 MED ORDER — ETOMIDATE 2 MG/ML IV SOLN
INTRAVENOUS | Status: DC | PRN
  Administered 2023-11-25: 6 mg via INTRAVENOUS

## 2023-11-25 MED ORDER — SODIUM CHLORIDE 0.9% FLUSH: 3.0000 mL | INTRAVENOUS | Status: DC | PRN

## 2023-11-25 SURGICAL SUPPLY — 1 items: PAD DEFIB RADIO PHYSIO CONN (PAD) ×1 IMPLANT

## 2023-11-25 NOTE — Interval H&P Note (Signed)
 History and Physical Interval Note:  11/25/2023 8:39 AM  Nathan Pruitt  has presented today for surgery, with the diagnosis of AFIB.  The various methods of treatment have been discussed with the patient and family. After consideration of risks, benefits and other options for treatment, the patient has consented to  Procedure(s): CARDIOVERSION (N/A) as a surgical intervention.  The patient's history has been reviewed, patient examined, no change in status, stable for surgery.  I have reviewed the patient's chart and labs.  Questions were answered to the patient's satisfaction.     Davide Risdon [employer]

## 2023-11-25 NOTE — Procedures (Signed)
 Electrical Cardioversion Procedure Note Nathan Pruitt [id number] Oct 15, 1945  Procedure: Electrical Cardioversion Indications:  Atrial Fibrillation  Procedure Details Consent: Risks of procedure as well as the alternatives and risks of each were explained to the (patient/caregiver).  Consent for procedure obtained. Time Out: Verified patient identification, verified procedure, site/side was marked, verified correct patient position, special equipment/implants available, medications/allergies/relevent history reviewed, required imaging and test results available.  Performed  Patient placed on cardiac monitor, pulse oximetry, supplemental oxygen as necessary.  Sedation given: Propofol  per anesthesiology Pacer pads placed anterior and posterior chest.  Cardioverted 1 time(s).  Cardioverted at 360J.  Evaluation Findings: Post procedure EKG shows: NSR Complications: None Patient did tolerate procedure well.   Nathan Pruitt 11/25/2023, 10:17 AM

## 2023-11-25 NOTE — Transfer of Care (Signed)
 Immediate Anesthesia Transfer of Care Note  Patient: Nathan Pruitt  Procedure(s) Performed: CARDIOVERSION  Patient Location: PACU  Anesthesia Type:MAC  Level of Consciousness: drowsy  Airway & Oxygen Therapy: Patient connected to nasal cannula oxygen  Post-op Assessment: Report given to RN and Post -op Vital signs reviewed and stable  Post vital signs: Reviewed and stable  Last Vitals:  Vitals Value Taken Time  BP 119/78 1016  Temp    Pulse 80 1016  Resp 12 1016  SpO2 100 1016    Last Pain:  Vitals:   11/25/23 0814  TempSrc: Temporal         Complications: There were no known notable events for this encounter.

## 2023-11-25 NOTE — Anesthesia Postprocedure Evaluation (Signed)
 Anesthesia Post Note  Patient: Nathan Pruitt  Procedure(s) Performed: CARDIOVERSION     Patient location during evaluation: Cath Lab Anesthesia Type: General Level of consciousness: sedated and patient cooperative Pain management: pain level controlled Vital Signs Assessment: post-procedure vital signs reviewed and stable Respiratory status: spontaneous breathing Cardiovascular status: stable Anesthetic complications: no   There were no known notable events for this encounter.  Last Vitals:  Vitals:   11/25/23 0814 11/25/23 1029  BP: 113/64 119/75  Pulse: 60 61  Resp: 13 12  Temp: 36.6 C 36.5 C  SpO2: 100%     Last Pain:  Vitals:   11/25/23 1029  TempSrc: Temporal                 Norleen Pope

## 2023-11-26 NOTE — Progress Notes (Signed)
 Remote ICD transmission.

## 2023-12-01 ENCOUNTER — Encounter: Admitting: Cardiology

## 2023-12-03 ENCOUNTER — Ambulatory Visit (HOSPITAL_COMMUNITY)
Admission: RE | Admit: 2023-12-03 | Discharge: 2023-12-03 | Disposition: A | Discharge status: Home / Self Care | Source: Ambulatory Visit | Attending: Family Medicine | Admitting: Family Medicine

## 2023-12-03 DIAGNOSIS — I5022 Chronic systolic (congestive) heart failure: Secondary | ICD-10-CM | POA: Diagnosis present

## 2023-12-03 DIAGNOSIS — I110 Hypertensive heart disease with heart failure: Secondary | ICD-10-CM | POA: Insufficient documentation

## 2023-12-03 DIAGNOSIS — I371 Nonrheumatic pulmonary valve insufficiency: Secondary | ICD-10-CM | POA: Insufficient documentation

## 2023-12-03 DIAGNOSIS — I083 Combined rheumatic disorders of mitral, aortic and tricuspid valves: Secondary | ICD-10-CM | POA: Insufficient documentation

## 2023-12-03 LAB — ECHOCARDIOGRAM COMPLETE
Area-P 1/2: 3.95 cm2
S' Lateral: 6.5 cm

## 2023-12-04 MED ORDER — FUROSEMIDE 40 MG PO TABS: 40.0000 mg | ORAL_TABLET | Freq: Two times a day (BID) | ORAL | 3 refills | Status: DC

## 2023-12-04 NOTE — Addendum Note (Signed)
 Addended by: TITA AGE B on: 12/04/2023 03:16 PM   Modules accepted: Orders

## 2023-12-15 ENCOUNTER — Encounter (HOSPITAL_COMMUNITY): Payer: Self-pay | Admitting: Cardiology

## 2023-12-18 ENCOUNTER — Telehealth (HOSPITAL_COMMUNITY): Payer: Self-pay | Admitting: Pharmacy Technician

## 2023-12-18 ENCOUNTER — Ambulatory Visit (HOSPITAL_COMMUNITY)
Admission: RE | Admit: 2023-12-18 | Discharge: 2023-12-18 | Disposition: A | Discharge status: Home / Self Care | Source: Ambulatory Visit | Attending: Cardiology | Admitting: Cardiology

## 2023-12-18 ENCOUNTER — Encounter (HOSPITAL_COMMUNITY): Payer: Self-pay | Admitting: Cardiology

## 2023-12-18 ENCOUNTER — Other Ambulatory Visit (HOSPITAL_COMMUNITY): Payer: Self-pay | Admitting: *Deleted

## 2023-12-18 VITALS — BP 104/60 | HR 64 | Ht 70.0 in | Wt 130.4 lb

## 2023-12-18 DIAGNOSIS — Z79890 Hormone replacement therapy (postmenopausal): Secondary | ICD-10-CM | POA: Diagnosis not present

## 2023-12-18 DIAGNOSIS — R2689 Other abnormalities of gait and mobility: Secondary | ICD-10-CM | POA: Diagnosis present

## 2023-12-18 DIAGNOSIS — I739 Peripheral vascular disease, unspecified: Secondary | ICD-10-CM | POA: Diagnosis not present

## 2023-12-18 DIAGNOSIS — Z7984 Long term (current) use of oral hypoglycemic drugs: Secondary | ICD-10-CM | POA: Diagnosis not present

## 2023-12-18 DIAGNOSIS — I2510 Atherosclerotic heart disease of native coronary artery without angina pectoris: Secondary | ICD-10-CM | POA: Diagnosis not present

## 2023-12-18 DIAGNOSIS — I4819 Other persistent atrial fibrillation: Secondary | ICD-10-CM | POA: Insufficient documentation

## 2023-12-18 DIAGNOSIS — I4891 Unspecified atrial fibrillation: Secondary | ICD-10-CM

## 2023-12-18 DIAGNOSIS — I255 Ischemic cardiomyopathy: Secondary | ICD-10-CM | POA: Diagnosis not present

## 2023-12-18 DIAGNOSIS — I714 Abdominal aortic aneurysm, without rupture, unspecified: Secondary | ICD-10-CM | POA: Diagnosis not present

## 2023-12-18 DIAGNOSIS — N183 Chronic kidney disease, stage 3 unspecified: Secondary | ICD-10-CM | POA: Diagnosis not present

## 2023-12-18 DIAGNOSIS — I340 Nonrheumatic mitral (valve) insufficiency: Secondary | ICD-10-CM | POA: Insufficient documentation

## 2023-12-18 DIAGNOSIS — R5383 Other fatigue: Secondary | ICD-10-CM | POA: Diagnosis present

## 2023-12-18 DIAGNOSIS — I5022 Chronic systolic (congestive) heart failure: Secondary | ICD-10-CM | POA: Diagnosis not present

## 2023-12-18 DIAGNOSIS — Z7901 Long term (current) use of anticoagulants: Secondary | ICD-10-CM | POA: Diagnosis not present

## 2023-12-18 DIAGNOSIS — J8410 Pulmonary fibrosis, unspecified: Secondary | ICD-10-CM | POA: Diagnosis not present

## 2023-12-18 DIAGNOSIS — Z95810 Presence of automatic (implantable) cardiac defibrillator: Secondary | ICD-10-CM | POA: Insufficient documentation

## 2023-12-18 DIAGNOSIS — Z79899 Other long term (current) drug therapy: Secondary | ICD-10-CM | POA: Diagnosis not present

## 2023-12-18 DIAGNOSIS — I130 Hypertensive heart and chronic kidney disease with heart failure and stage 1 through stage 4 chronic kidney disease, or unspecified chronic kidney disease: Secondary | ICD-10-CM | POA: Diagnosis not present

## 2023-12-18 DIAGNOSIS — Z87891 Personal history of nicotine dependence: Secondary | ICD-10-CM | POA: Insufficient documentation

## 2023-12-18 DIAGNOSIS — E039 Hypothyroidism, unspecified: Secondary | ICD-10-CM | POA: Diagnosis not present

## 2023-12-18 DIAGNOSIS — R0609 Other forms of dyspnea: Secondary | ICD-10-CM | POA: Diagnosis present

## 2023-12-18 DIAGNOSIS — Z955 Presence of coronary angioplasty implant and graft: Secondary | ICD-10-CM | POA: Diagnosis not present

## 2023-12-18 DIAGNOSIS — I472 Ventricular tachycardia, unspecified: Secondary | ICD-10-CM | POA: Insufficient documentation

## 2023-12-18 NOTE — Progress Notes (Signed)
 ID:  Nathan Pruitt, DOB July 30, 1945, MRN [id number]   Provider location: [place] [facility] Type of Visit: Established patient   PCP:  Ina Marcellus RAMAN, MD  Cardiologist:  [name], MD  Chief complaint: Fatigue  History of Present Illness: Nathan Pruitt is a 78 y.o. male with a history of history of CAD, PAD, ischemic CMP, and VT s/p ICD.    Echo in 2/17 showed EF 40% and regional WMAs.   In 3/18, he developed episodes of VT as well as increased dyspnea.  LHC/RHC was done, showing new occlusion of a large ramus (CTO, probably a month or so old at time of cath) and old CTO RCA with collaterals.  He had DES x 3 to ramus.  Filling pressures were optimized on RHC.  Amiodarone  was increased to 200 mg daily from 100 mg daily.  No VT since PCI.    Echo in 6/19 showed EF 35-40% with wall motion abnormalities. Echo in 1/21 showed EF 40-45% with basal to mid inferolateral and inferior akinesis, mild MR, normal RV, PASP 35 mmHg.   LHC/RHC in 9/21 with normal filling pressures and cardiac output but 95% OM1 stenosis.  This was treated with DES to OM1.   He has been following with Dr. Theophilus with idiopathic pulmonary fibrosis.  I was concerned that he could have amiodarone  -related lung toxicity but Dr. Theophilus did not think this was very likely.  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     Labs drawn in 1/22 showed K up to 5.9 and creatinine 4.  He was sent to the ER at [facility].  He was found to have COVID-19 and was admitted. He had a productive cough and sore throat.  He was thought to be dehydrated and got IV fluid.  Creatinine 2.3 at discharge.  Lasix , valsartan , and eplerenone  were stopped.  Other meds were continued.  Echo in 6/22 showed EF 30-35%, basal-mid inferior and inferolateral akinesis, anterolateral hypokinesis, moderate MR (appears infarct-related), mildly decreased RV function.   In 6/23, he had upgrade of device to CRT-D  given frequent RV pacing.  Echo in 2/24 showed EF 25-30% with anterolateral/inferolateral akinesis, basal-mid inferior akinesis, mild RV dysfunction, mild-moderate MR.   CPX in 10/24 was a submaximal study but probably severe HF limitation as well as deconditioning.   RHC in 11/24 showed normal filling pressures and CI 2.11 by Fick, 2.35 by thermodilution.   In 2/25, patient had baroreceptor activation therapy placed.   Patient was noted to go into atrial fibrillation/flutter by device interrogation.  He was started on Eliquis . He had DCCV to NSR in 7/25.   Echo in 7/25 showed EF 20-25%, moderate LV dilation, mild RV enlargement and moderate-severe RV dysfunction, PASP 55 mmHg, moderate-severe MR (likely infarct related, worse than on prior echo), moderate AI, IVC dilated.   He returns today for followup of CHF.  He is in NSR today (a-paced) but does not feel any better than at last appointment.  He feels wobbly with poor balance.  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  He has the same exertional dyspnea walking up a hill that he has had in the past but the tiredness is a new finding over the last couple of months.  He is on a higher dose of Lasix  and weight is down 14 lbs.  Creatinine was up to 2.51 but repeat earlier this week showed decrease  to 2.3.   ECG (personally reviewed): A-BiV pacing   Labs (9/23): hgb 11.2, K 4.7, creatinine 2.2, LFTs normal, transferrin saturation 21% Labs (1/24): K 4.5, creatinine 2.3, LFTs normal, TSH slightly elevated Labs (8/24): K 4.8, creatinine 2.3, LFTs normal, LDL 70 Labs (12/24): K 4.1, creatinine 1.9 Labs (6/25): K 4.3, creatinine 2.22, hgb 12.1 Labs (7/25): LDL 74, TSH 17, K 4.2, creatinine 2.51, LFTs normal Labs (8/25): K 4, creatinine 2.3   Allergies (verified):  No Known Drug Allergies   Past Medical History: 1. Coronary artery disease. The patient reports history of silent MI in 1993.  This was likely an inferior MI (see  below).  - The patient presented to [facility] in 5/10 with VT and mildly elevated cardiac enzymes. LHC (5/10):  Inferobasal dyskinesis with EF 35-40%.  There was chronic total occlusion of the mid RCA with good collaterals.  Luminals LCA.  This did not appear to be an acute cause of the 5/10 event.  - LHC (3/18): Known occlusion of RCA with collaterals, new occlusion of ramus.  DES x 3 to ramus.  - LHC (9/21): Known occlusion RCA with collaterals, 95% OMI treated with DES.  2. Hypertension.  3. Hyperlipidemia: Intolerant of higher doses of simvastatin , Crestor, Lipitor, pravastatin . Zetia  caused constipation.  4. Remote tobacco abuse with 47-pack-year history, quitting in August 2009.  5. Peripheral arterial disease.  - Status post left-to-right fem-fem bypass performed in [place] in March 2009.  - Status post redo left-to-right fem-fem bypass performed by Dr. Eliza at [facility] in 2009.  - ABIs normal 3/15, ABIs normal 3/16, ABIs normal 3/17, ABIs normal 4/18, ABIs normal 4/19, ABIs normal 12/20. ABIs stable 3/24.  6. Rheumatoid arthritis, on leflunomide .  7.  Ischemic cardiomyopathy:  EF 35-40% by LV-gram 5/10 with inferobasal dyskinesis.  Echo 5/10 showed EF 40% with mild LVH, no significant MR, inferobasal and posterobasal akinesis.  Echo (7/11): EF 50%, mild LVH, basal-mid inferoposterior akinesis.  Echo (7/12): EF 45-50% with basal anterolateral, basal posterior, and basal to mid inferior akinesis.  Echo (4/16): EF 40-45%, basal to mid inferolateral AK, basal inferior AK, basal to mid anterolateral HK.  Echo (2/17): EF 40% with basal to mid inferior akinesis, basal inferolateral aneurysm, mid inferolateral akinesis, basal anterolateral hypokinesis, normal RV size and systolic function, PASP 25 mmHg.  - Echo (3/18) with EF 30-35%, moderate LV dilation, moderate MR, mildly decreased RV systolic function, PASP 51 mmmHg. - RHC (3/18): mean RA 4, PA 38/12, mean PCWP 17, CI 2.2.  - Echo (6/19):  EF 35-40%, inferior/inferolateral/anterolateral WMAs, moderate MR likely infarct-related, normal RV size with mildly decreased systolic function.  - Echo (1/21): EF 35-40% with wall motion abnormalities. Echo was done today and reviewed, EF 40-45% with basal to mid inferolateral and inferior akinesis, mild MR, normal RV, PASP 35 mmHg.  - RHC (9/21): mean RA 4, PA 33/12, mean PCWP 9, CI 3.12 - Echo (6/22): EF 30-35%, basal-mid inferior and inferolateral akinesis, anterolateral hypokinesis, moderate MR (appears infarct-related), mildly decreased RV function.  - CRT-D upgrade (Medtronic) in 6/23.  - Echo (2/24): EF 25-30% with anterolateral/inferolateral akinesis, basal-mid inferior akinesis, mild RV dysfunction, mild-moderate MR. - CPX (10/24): submaximal with RER 1.04, peak VO2 11.6, VE/VCO2 slope 54; severe HF limitation, also with deconditioning.  - RHC (11/24): mean RA 2, PA 31/7, mean PCWP 11, CI 2.11 Fick/2.35 thermodilution.  - Baroreceptor activation therapy 2/25.  - Echo (7/25): EF 20-25%, moderate LV dilation, mild RV enlargement and moderate-severe  RV dysfunction, PASP 55 mmHg, moderate-severe MR (likely infarct related, worse than on prior echo), moderate AI, IVC dilated.  8.  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  VT storm 5/10 suppressed by amiodarone  and Coreg .  He has a Medtronic CRT-D device.  9.  Atrial flutter:  Status post isthmus ablation 5/10.   10. Restrictive lung defect: PFTs (7/11): FVC 74%, FEV1 80%, ratio 75%, TLC 78%, DLCO 68%.  This suggests a mild restrictive defect and a mild obstructive defect.  He did have response to bronchodilator. These PFTs were significantly better than the report from Dr. Mardee in [place] done prior.  He had last PFTs in 2/12 with no significant change.  - PFTs (9/21) with significantly decreased DLCO and concern for ILD/pulmonary fibrosis.  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  11.  Anxiety 12.  Chronic  cough: No relief with change from ACEI to ARB or with trial of PPI.  13.  CKD: Stage 3.  14. Mitral regurgitation: Moderate on 6/22 echo, suspect infarct-related.  - Moderate-severe on 7/25 echo.  15. AAA: 3.1 cm AAA on abdominal US  12/20  - Abdominal US  5/22 with 3.1 cm AAA - Abdominal US  2/24 with 3.4 cm AAA 16. COVID-19 infection: 1/22.  17. Atrial fibrillation/flutter: Persistent.  - DCCV to NSR in 7/25 18. Fe deficiency anemia  Current Outpatient Medications  Medication Sig Dispense Refill   acetaminophen  (TYLENOL ) 500 MG tablet Take 1,000 mg by mouth every 6 (six) hours as needed for moderate pain (pain score 4-6).     amiodarone  (PACERONE ) 200 MG tablet Take 0.5 tablets (100 mg total) by mouth daily. 90 tablet 1   carvedilol  (COREG ) 12.5 MG tablet Take 1 tablet by mouth twice daily 180 tablet 3   chlorhexidine  (PERIDEX ) 0.12 % solution Use as directed 15 mLs in the mouth or throat daily as needed (irritation).   3   cholecalciferol (VITAMIN D3) 25 MCG (1000 UNIT) tablet Take 1,000 Units by mouth daily.     dabigatran  (PRADAXA ) 75 MG CAPS capsule Take 1 capsule (75 mg total) by mouth 2 (two) times daily. 180 capsule 3   dapagliflozin  propanediol (FARXIGA ) 10 MG TABS tablet Take 1 tablet (10 mg total) by mouth daily before breakfast. 90 tablet 3   eplerenone  (INSPRA ) 25 MG tablet Take 1 tablet by mouth once daily 90 tablet 0   fluticasone  (FLONASE ) 50 MCG/ACT nasal spray Place 1-2 sprays into both nostrils daily as needed for allergies or rhinitis.     furosemide  (LASIX ) 40 MG tablet Take 1 tablet (40 mg total) by mouth 2 (two) times daily. 180 tablet 3   leflunomide  (ARAVA ) 20 MG tablet Take 20 mg by mouth daily.     levothyroxine  (SYNTHROID ) 50 MCG tablet Take 1 tablet (50 mcg total) by mouth daily before breakfast. 90 tablet 3   Melatonin 10 MG TABS Take 10 mg by mouth at bedtime as needed (sleep).      simvastatin  (ZOCOR ) 20 MG tablet TAKE 1 TABLET BY MOUTH ONCE DAILY AT BEDTIME  90 tablet 3   No current facility-administered medications for this encounter.    Allergies:   Atorvastatin, Crestor [rosuvastatin calcium ], Enalapril , Lisinopril, Losartan , and Telmisartan    Social History:  The patient  reports that he quit smoking about 16 years ago. His smoking use included cigarettes. He started smoking about 63 years ago. He has a 47 pack-year smoking history. He has never been exposed to tobacco smoke. He has never  used smokeless tobacco. He reports current alcohol use. He reports that he does not use drugs.   Family History:  The patient's family history includes Gastric cancer in his mother; Heart attack (age of onset: 70) in his brother; Peripheral vascular disease in his father; Throat cancer in his father.   ROS:  Please see the history of present illness.   All other systems are personally reviewed and negative.   Exam:   BP 104/60   Pulse 64   Ht 5' 10 (1.778 m)   Wt 59.1 kg (130 lb 6.4 oz)   SpO2 98%   BMI 18.71 kg/m  General: NAD Neck: No JVD, no thyromegaly or thyroid  nodule.  Lungs: Dry crackles noted CV: Nondisplaced PMI.  Heart regular S1/S2, no S3/S4, 2/6 HSM at apex.  No peripheral edema.  No carotid bruit.  Normal pedal pulses.  Abdomen: Soft, nontender, no hepatosplenomegaly, no distention.  Skin: Intact without lesions or rashes.  Neurologic: Alert and oriented x 3.  Psych: Normal affect. Extremities: No clubbing or cyanosis.  HEENT: Normal.   Wt Readings from Last 3 Encounters:  12/18/23 59.1 kg (130 lb 6.4 oz)  11/25/23 63 kg (139 lb)  11/17/23 65.4 kg (144 lb 3.2 oz)     ASSESSMENT AND PLAN:  1. Chronic systolic CHF: Ischemic cardiomyopathy.  Medtronic CRT-D device, baroreceptor activation therapy device.  Echo in 6/22 with EF 30-35%, mild RV dysfunction. Upgraded to CRT device given high percentage of RV pacing.  Echo 2/24 showed EF 25-30% with anterolateral/inferolateral akinesis, basal-mid inferior akinesis, mild RV dysfunction,  mild-moderate MR.  CPX in 10/24 was submaximal but suggestive of severe HF limitation.  RHC In 11/24 showed normal filling pressures with low, but not markedly low, CI (2.11 Fick, 2.35 thermo). Most recent echo in 7/25 showed EF 20-25%, moderate LV dilation, mild RV enlargement and moderate-severe RV dysfunction, PASP 55 mmHg, moderate-severe MR (likely infarct related, worse than on prior echo), moderate AI, IVC dilated.  Pulmonary fibrosis plays a role in his symptoms.  CKD limits medication titration. NYHA class III symptoms, worse recently (primarily fatigue).  Despite cardioversion back to NSR and diuresis (14 lbs down), he does not feel better.   - Mitral regurgitation is worse on 7/25 echo, may be playing a role in his symptoms.  See below.  - Continue Coreg  12.5 mg bid.       - Continue Lasix  60 qam/40 qpm.  - Continue eplerenone  25 mg daily.  - He is off valsartan  for now with elevated creatinine, may be able to start back on low dose in the future.    - He would be a difficult LVAD candidate with CKD and his lung disease (pulmonary fibrosis).  He says that he would not be interested in open heart surgery and also does not have anyone who could stay with him as a caregiver post-op.  RHC in 10/24 did not show markedly low output though CPX showed a severe functional limitation.  2. CAD: Denies chest pain.  Had DES x 3 to ramus in 3/18 and DES to OM1 in 9/21.   - Continue Zocor .  - He is now off Plavix  due to Eliquis  use.   3. Hyperlipidemia: Stable on simvastatin  without myalgias.  Has not been able to tolerate higher dose of simvastatin  or other statins. Unable to tolerate Zetia  due to constipation.  4. Hx of VT: Has been on amiodarone  100 mg daily. Has ICD.  He did not tolerate sotalol .  -  LFTs normal in 7/25.  He is on Levoxyl  for amiodarone -induced mild hypothyroidism.   - Knows to get a yearly eye exam   - See below regarding ?amiodarone  lung toxicity.  5. PAD: He denies claudication.   Stable ABIs 2/24 at [facility]. 6. AAA: 3.4 cm in 2/24, followed by [facility].  7. CKD: Stage 3.  Last creatinine 2.3, improved.  - Follows with nephrology now.    8. Pulmonary: PFTs were done with increased dyspnea and amiodorone use, concerning for ILD/pulmonary fibrosis.  High resolution CT chest was concerning for ILD.  I have worried about amiodarone -induced lung toxicity. 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  He was offered antifibrotic treatment but wants to hold off for now. I think that some of his symptomatology is due to IPF.  - Can continue amiodarone  100 mg daily as above for VT suppression.  - Pulmonary followup with Dr. Theophilus.   9. Atrial fibrillation/flutter: He was in persistent atrial flutter, now s/p DCCV in 7/25.  He remains in NSR.   - Continue Eliquis  5 mg bid.  - Continue low dose amiodarone .  Will not increase with concern for amiodarone  lung toxicity.  10. Hypothyroidism: TSH higher.  - Increase Levoxyl  to 75 mcg daily.  Repeat TSH 1 month.  11. Mitral regurgitation: Suspect infarct-related MR.  Mitral regurgitation was at least moderate-severe on 7/25 echo.  MR looks worse than in the past and may contribute to worsened symptoms.    - I will arrange for TEE to assess severity of MR, he may be an mTEER candidate. We discussed risks/benefits and he agrees to the procedure.   Followup 1 month  I spent 41 minutes reviewing records, interviewing/examining patient, and managing orders.   Signed, [name], MD  12/18/2023   [facility] [address] [facility] [place] [address] [phone] (office) [phone] (fax)

## 2023-12-18 NOTE — Patient Instructions (Addendum)
 Medication Changes:  None, continue current medications  Testing/Procedures:  Your physician has requested that you have a TEE. During a TEE, sound waves are used to create images of your heart. It provides your doctor with information about the size and shape of your heart and how well your heart's chambers and valves are working. In this test, a transducer is attached to the end of a flexible tube that's guided down your throat and into your esophagus (the tube leading from you mouth to your stomach) to get a more detailed image of your heart. You are not awake for the procedure. Please see the instruction below.      Dear Nathan Pruitt  You are scheduled for a TEE (Transesophageal Echocardiogram) on Tuesday, September 2 with Dr. Rolan.    Please arrive at the [facility] (Main Entrance A) at [facility]: [address] [place], [place] [address] at 6:30 AM (This time is 1 hour(s) before your procedure to ensure your preparation).   Free valet parking service is available. You will check in at ADMITTING.   *Please Note: You will receive a call the day before your procedure to confirm the appointment time. That time may have changed from the original time based on the schedule for that day.*    DIET:  Nothing to eat or drink after midnight except a sip of water  with medications (see medication instructions below)  MEDICATION INSTRUCTIONS: !!IF ANY NEW MEDICATIONS ARE STARTED AFTER TODAY, PLEASE NOTIFY YOUR PROVIDER AS SOON AS POSSIBLE!!  FYI: Medications such as Semaglutide (Ozempic, [place]), Tirzepatide (Mounjaro, Zepbound), Dulaglutide (Trulicity), etc (GLP1 agonists) AND Canagliflozin (Invokana), Dapagliflozin  (Farxiga ), Empagliflozin  (Jardiance ), Ertugliflozin (Steglatro), Bexagliflozin [employer]) or any combination with one of these drugs such as Invokamet (Canagliflozin/Metformin), Synjardy (Empagliflozin /Metformin), etc (SGLT2 inhibitors) must be held around the  time of a procedure. This is not a comprehensive list of all of these drugs. Please review all of your medications and talk to your provider if you take any one of these. If you are not sure, ask your provider.   HOLD: Dapagliflozin  (Farxiga ) for 3 days prior to the procedure. Last dose on Friday, August 29.   Continue taking your anticoagulant (blood thinner): Dabigatran  (Pradaxa ).   Tuesday 9/2 AM DO NOT TAKE: Furosemide , Farxiga , or Epleronone  FYI:  For your safety, and to allow us  to monitor your vital signs accurately during the surgery/procedure we request: If you have artificial nails, gel coating, SNS etc, please have those removed prior to your surgery/procedure. Not having the nail coverings /polish removed may result in cancellation or delay of your surgery/procedure.  Your support person will be asked to wait in the waiting room during your procedure.  It is OK to have someone drop you off and come back when you are ready to be discharged.  You cannot drive after the procedure and will need someone to drive you home.  Bring your insurance cards.  *Special Note: Every effort is made to have your procedure done on time. Occasionally there are emergencies that occur at the hospital that may cause delays. Please be patient if a delay does occur.    Special Instructions // Education:  Do the following things EVERYDAY: Weigh yourself in the morning before breakfast. Write it down and keep it in a log. Take your medicines as prescribed Eat low salt foods--Limit salt (sodium) to 2000 mg per day.  Stay as active as you can everyday Limit all fluids for the day to less  than 2 liters   Follow-Up in: 1 month   At the [facility], you and your health needs are our priority. We have a designated team specialized in the treatment of Heart Failure. This Care Team includes your primary Heart Failure Specialized Cardiologist (physician), Advanced Practice Providers (APPs-  Physician Assistants and Nurse Practitioners), and Pharmacist who all work together to provide you with the care you need, when you need it.   You may see any of the following providers on your designated Care Team at your next follow up:  Dr. Toribio Fuel Dr. [name] Dr. Ria Commander Dr. Odis Brownie Greig Mosses, NP Caffie Shed, [place] [facility] [place], [place] Beckey Coe, NP [place] Lee, NP Tinnie Redman, PharmD   Please be sure to bring in all your medications bottles to every appointment.   Need to Contact Us :  If you have any questions or concerns before your next appointment please send us  a message through [facility] or call our office at [phone].    TO LEAVE A MESSAGE FOR THE NURSE SELECT OPTION 2, PLEASE LEAVE A MESSAGE INCLUDING: YOUR NAME DATE OF BIRTH CALL BACK NUMBER REASON FOR CALL**this is important as we prioritize the call backs  YOU WILL RECEIVE A CALL BACK THE SAME DAY AS LONG AS YOU CALL BEFORE 4:00 PM

## 2023-12-18 NOTE — H&P (View-Only) (Signed)
 ID:  Nathan Pruitt, DOB July 30, 1945, MRN [id number]   Provider location: [place] [facility] Type of Visit: Established patient   PCP:  Ina Marcellus RAMAN, MD  Cardiologist:  [name], MD  Chief complaint: Fatigue  History of Present Illness: Nathan Pruitt is a 78 y.o. male with a history of history of CAD, PAD, ischemic CMP, and VT s/p ICD.    Echo in 2/17 showed EF 40% and regional WMAs.   In 3/18, he developed episodes of VT as well as increased dyspnea.  LHC/RHC was done, showing new occlusion of a large ramus (CTO, probably a month or so old at time of cath) and old CTO RCA with collaterals.  He had DES x 3 to ramus.  Filling pressures were optimized on RHC.  Amiodarone  was increased to 200 mg daily from 100 mg daily.  No VT since PCI.    Echo in 6/19 showed EF 35-40% with wall motion abnormalities. Echo in 1/21 showed EF 40-45% with basal to mid inferolateral and inferior akinesis, mild MR, normal RV, PASP 35 mmHg.   LHC/RHC in 9/21 with normal filling pressures and cardiac output but 95% OM1 stenosis.  This was treated with DES to OM1.   He has been following with Dr. Theophilus with idiopathic pulmonary fibrosis.  I was concerned that he could have amiodarone  -related lung toxicity but Dr. Theophilus did not think this was very likely.  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     Labs drawn in 1/22 showed K up to 5.9 and creatinine 4.  He was sent to the ER at [facility].  He was found to have COVID-19 and was admitted. He had a productive cough and sore throat.  He was thought to be dehydrated and got IV fluid.  Creatinine 2.3 at discharge.  Lasix , valsartan , and eplerenone  were stopped.  Other meds were continued.  Echo in 6/22 showed EF 30-35%, basal-mid inferior and inferolateral akinesis, anterolateral hypokinesis, moderate MR (appears infarct-related), mildly decreased RV function.   In 6/23, he had upgrade of device to CRT-D  given frequent RV pacing.  Echo in 2/24 showed EF 25-30% with anterolateral/inferolateral akinesis, basal-mid inferior akinesis, mild RV dysfunction, mild-moderate MR.   CPX in 10/24 was a submaximal study but probably severe HF limitation as well as deconditioning.   RHC in 11/24 showed normal filling pressures and CI 2.11 by Fick, 2.35 by thermodilution.   In 2/25, patient had baroreceptor activation therapy placed.   Patient was noted to go into atrial fibrillation/flutter by device interrogation.  He was started on Eliquis . He had DCCV to NSR in 7/25.   Echo in 7/25 showed EF 20-25%, moderate LV dilation, mild RV enlargement and moderate-severe RV dysfunction, PASP 55 mmHg, moderate-severe MR (likely infarct related, worse than on prior echo), moderate AI, IVC dilated.   He returns today for followup of CHF.  He is in NSR today (a-paced) but does not feel any better than at last appointment.  He feels wobbly with poor balance.  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  He has the same exertional dyspnea walking up a hill that he has had in the past but the tiredness is a new finding over the last couple of months.  He is on a higher dose of Lasix  and weight is down 14 lbs.  Creatinine was up to 2.51 but repeat earlier this week showed decrease  to 2.3.   ECG (personally reviewed): A-BiV pacing   Labs (9/23): hgb 11.2, K 4.7, creatinine 2.2, LFTs normal, transferrin saturation 21% Labs (1/24): K 4.5, creatinine 2.3, LFTs normal, TSH slightly elevated Labs (8/24): K 4.8, creatinine 2.3, LFTs normal, LDL 70 Labs (12/24): K 4.1, creatinine 1.9 Labs (6/25): K 4.3, creatinine 2.22, hgb 12.1 Labs (7/25): LDL 74, TSH 17, K 4.2, creatinine 2.51, LFTs normal Labs (8/25): K 4, creatinine 2.3   Allergies (verified):  No Known Drug Allergies   Past Medical History: 1. Coronary artery disease. The patient reports history of silent MI in 1993.  This was likely an inferior MI (see  below).  - The patient presented to [facility] in 5/10 with VT and mildly elevated cardiac enzymes. LHC (5/10):  Inferobasal dyskinesis with EF 35-40%.  There was chronic total occlusion of the mid RCA with good collaterals.  Luminals LCA.  This did not appear to be an acute cause of the 5/10 event.  - LHC (3/18): Known occlusion of RCA with collaterals, new occlusion of ramus.  DES x 3 to ramus.  - LHC (9/21): Known occlusion RCA with collaterals, 95% OMI treated with DES.  2. Hypertension.  3. Hyperlipidemia: Intolerant of higher doses of simvastatin , Crestor, Lipitor, pravastatin . Zetia  caused constipation.  4. Remote tobacco abuse with 47-pack-year history, quitting in August 2009.  5. Peripheral arterial disease.  - Status post left-to-right fem-fem bypass performed in [place] in March 2009.  - Status post redo left-to-right fem-fem bypass performed by Dr. Eliza at [facility] in 2009.  - ABIs normal 3/15, ABIs normal 3/16, ABIs normal 3/17, ABIs normal 4/18, ABIs normal 4/19, ABIs normal 12/20. ABIs stable 3/24.  6. Rheumatoid arthritis, on leflunomide .  7.  Ischemic cardiomyopathy:  EF 35-40% by LV-gram 5/10 with inferobasal dyskinesis.  Echo 5/10 showed EF 40% with mild LVH, no significant MR, inferobasal and posterobasal akinesis.  Echo (7/11): EF 50%, mild LVH, basal-mid inferoposterior akinesis.  Echo (7/12): EF 45-50% with basal anterolateral, basal posterior, and basal to mid inferior akinesis.  Echo (4/16): EF 40-45%, basal to mid inferolateral AK, basal inferior AK, basal to mid anterolateral HK.  Echo (2/17): EF 40% with basal to mid inferior akinesis, basal inferolateral aneurysm, mid inferolateral akinesis, basal anterolateral hypokinesis, normal RV size and systolic function, PASP 25 mmHg.  - Echo (3/18) with EF 30-35%, moderate LV dilation, moderate MR, mildly decreased RV systolic function, PASP 51 mmmHg. - RHC (3/18): mean RA 4, PA 38/12, mean PCWP 17, CI 2.2.  - Echo (6/19):  EF 35-40%, inferior/inferolateral/anterolateral WMAs, moderate MR likely infarct-related, normal RV size with mildly decreased systolic function.  - Echo (1/21): EF 35-40% with wall motion abnormalities. Echo was done today and reviewed, EF 40-45% with basal to mid inferolateral and inferior akinesis, mild MR, normal RV, PASP 35 mmHg.  - RHC (9/21): mean RA 4, PA 33/12, mean PCWP 9, CI 3.12 - Echo (6/22): EF 30-35%, basal-mid inferior and inferolateral akinesis, anterolateral hypokinesis, moderate MR (appears infarct-related), mildly decreased RV function.  - CRT-D upgrade (Medtronic) in 6/23.  - Echo (2/24): EF 25-30% with anterolateral/inferolateral akinesis, basal-mid inferior akinesis, mild RV dysfunction, mild-moderate MR. - CPX (10/24): submaximal with RER 1.04, peak VO2 11.6, VE/VCO2 slope 54; severe HF limitation, also with deconditioning.  - RHC (11/24): mean RA 2, PA 31/7, mean PCWP 11, CI 2.11 Fick/2.35 thermodilution.  - Baroreceptor activation therapy 2/25.  - Echo (7/25): EF 20-25%, moderate LV dilation, mild RV enlargement and moderate-severe  RV dysfunction, PASP 55 mmHg, moderate-severe MR (likely infarct related, worse than on prior echo), moderate AI, IVC dilated.  8.  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  VT storm 5/10 suppressed by amiodarone  and Coreg .  He has a Medtronic CRT-D device.  9.  Atrial flutter:  Status post isthmus ablation 5/10.   10. Restrictive lung defect: PFTs (7/11): FVC 74%, FEV1 80%, ratio 75%, TLC 78%, DLCO 68%.  This suggests a mild restrictive defect and a mild obstructive defect.  He did have response to bronchodilator. These PFTs were significantly better than the report from Dr. Mardee in [place] done prior.  He had last PFTs in 2/12 with no significant change.  - PFTs (9/21) with significantly decreased DLCO and concern for ILD/pulmonary fibrosis.  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  11.  Anxiety 12.  Chronic  cough: No relief with change from ACEI to ARB or with trial of PPI.  13.  CKD: Stage 3.  14. Mitral regurgitation: Moderate on 6/22 echo, suspect infarct-related.  - Moderate-severe on 7/25 echo.  15. AAA: 3.1 cm AAA on abdominal US  12/20  - Abdominal US  5/22 with 3.1 cm AAA - Abdominal US  2/24 with 3.4 cm AAA 16. COVID-19 infection: 1/22.  17. Atrial fibrillation/flutter: Persistent.  - DCCV to NSR in 7/25 18. Fe deficiency anemia  Current Outpatient Medications  Medication Sig Dispense Refill   acetaminophen  (TYLENOL ) 500 MG tablet Take 1,000 mg by mouth every 6 (six) hours as needed for moderate pain (pain score 4-6).     amiodarone  (PACERONE ) 200 MG tablet Take 0.5 tablets (100 mg total) by mouth daily. 90 tablet 1   carvedilol  (COREG ) 12.5 MG tablet Take 1 tablet by mouth twice daily 180 tablet 3   chlorhexidine  (PERIDEX ) 0.12 % solution Use as directed 15 mLs in the mouth or throat daily as needed (irritation).   3   cholecalciferol (VITAMIN D3) 25 MCG (1000 UNIT) tablet Take 1,000 Units by mouth daily.     dabigatran  (PRADAXA ) 75 MG CAPS capsule Take 1 capsule (75 mg total) by mouth 2 (two) times daily. 180 capsule 3   dapagliflozin  propanediol (FARXIGA ) 10 MG TABS tablet Take 1 tablet (10 mg total) by mouth daily before breakfast. 90 tablet 3   eplerenone  (INSPRA ) 25 MG tablet Take 1 tablet by mouth once daily 90 tablet 0   fluticasone  (FLONASE ) 50 MCG/ACT nasal spray Place 1-2 sprays into both nostrils daily as needed for allergies or rhinitis.     furosemide  (LASIX ) 40 MG tablet Take 1 tablet (40 mg total) by mouth 2 (two) times daily. 180 tablet 3   leflunomide  (ARAVA ) 20 MG tablet Take 20 mg by mouth daily.     levothyroxine  (SYNTHROID ) 50 MCG tablet Take 1 tablet (50 mcg total) by mouth daily before breakfast. 90 tablet 3   Melatonin 10 MG TABS Take 10 mg by mouth at bedtime as needed (sleep).      simvastatin  (ZOCOR ) 20 MG tablet TAKE 1 TABLET BY MOUTH ONCE DAILY AT BEDTIME  90 tablet 3   No current facility-administered medications for this encounter.    Allergies:   Atorvastatin, Crestor [rosuvastatin calcium ], Enalapril , Lisinopril, Losartan , and Telmisartan    Social History:  The patient  reports that he quit smoking about 16 years ago. His smoking use included cigarettes. He started smoking about 63 years ago. He has a 47 pack-year smoking history. He has never been exposed to tobacco smoke. He has never  used smokeless tobacco. He reports current alcohol use. He reports that he does not use drugs.   Family History:  The patient's family history includes Gastric cancer in his mother; Heart attack (age of onset: 70) in his brother; Peripheral vascular disease in his father; Throat cancer in his father.   ROS:  Please see the history of present illness.   All other systems are personally reviewed and negative.   Exam:   BP 104/60   Pulse 64   Ht 5' 10 (1.778 m)   Wt 59.1 kg (130 lb 6.4 oz)   SpO2 98%   BMI 18.71 kg/m  General: NAD Neck: No JVD, no thyromegaly or thyroid  nodule.  Lungs: Dry crackles noted CV: Nondisplaced PMI.  Heart regular S1/S2, no S3/S4, 2/6 HSM at apex.  No peripheral edema.  No carotid bruit.  Normal pedal pulses.  Abdomen: Soft, nontender, no hepatosplenomegaly, no distention.  Skin: Intact without lesions or rashes.  Neurologic: Alert and oriented x 3.  Psych: Normal affect. Extremities: No clubbing or cyanosis.  HEENT: Normal.   Wt Readings from Last 3 Encounters:  12/18/23 59.1 kg (130 lb 6.4 oz)  11/25/23 63 kg (139 lb)  11/17/23 65.4 kg (144 lb 3.2 oz)     ASSESSMENT AND PLAN:  1. Chronic systolic CHF: Ischemic cardiomyopathy.  Medtronic CRT-D device, baroreceptor activation therapy device.  Echo in 6/22 with EF 30-35%, mild RV dysfunction. Upgraded to CRT device given high percentage of RV pacing.  Echo 2/24 showed EF 25-30% with anterolateral/inferolateral akinesis, basal-mid inferior akinesis, mild RV dysfunction,  mild-moderate MR.  CPX in 10/24 was submaximal but suggestive of severe HF limitation.  RHC In 11/24 showed normal filling pressures with low, but not markedly low, CI (2.11 Fick, 2.35 thermo). Most recent echo in 7/25 showed EF 20-25%, moderate LV dilation, mild RV enlargement and moderate-severe RV dysfunction, PASP 55 mmHg, moderate-severe MR (likely infarct related, worse than on prior echo), moderate AI, IVC dilated.  Pulmonary fibrosis plays a role in his symptoms.  CKD limits medication titration. NYHA class III symptoms, worse recently (primarily fatigue).  Despite cardioversion back to NSR and diuresis (14 lbs down), he does not feel better.   - Mitral regurgitation is worse on 7/25 echo, may be playing a role in his symptoms.  See below.  - Continue Coreg  12.5 mg bid.       - Continue Lasix  60 qam/40 qpm.  - Continue eplerenone  25 mg daily.  - He is off valsartan  for now with elevated creatinine, may be able to start back on low dose in the future.    - He would be a difficult LVAD candidate with CKD and his lung disease (pulmonary fibrosis).  He says that he would not be interested in open heart surgery and also does not have anyone who could stay with him as a caregiver post-op.  RHC in 10/24 did not show markedly low output though CPX showed a severe functional limitation.  2. CAD: Denies chest pain.  Had DES x 3 to ramus in 3/18 and DES to OM1 in 9/21.   - Continue Zocor .  - He is now off Plavix  due to Eliquis  use.   3. Hyperlipidemia: Stable on simvastatin  without myalgias.  Has not been able to tolerate higher dose of simvastatin  or other statins. Unable to tolerate Zetia  due to constipation.  4. Hx of VT: Has been on amiodarone  100 mg daily. Has ICD.  He did not tolerate sotalol .  -  LFTs normal in 7/25.  He is on Levoxyl  for amiodarone -induced mild hypothyroidism.   - Knows to get a yearly eye exam   - See below regarding ?amiodarone  lung toxicity.  5. PAD: He denies claudication.   Stable ABIs 2/24 at [facility]. 6. AAA: 3.4 cm in 2/24, followed by [facility].  7. CKD: Stage 3.  Last creatinine 2.3, improved.  - Follows with nephrology now.    8. Pulmonary: PFTs were done with increased dyspnea and amiodorone use, concerning for ILD/pulmonary fibrosis.  High resolution CT chest was concerning for ILD.  I have worried about amiodarone -induced lung toxicity. 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  He was offered antifibrotic treatment but wants to hold off for now. I think that some of his symptomatology is due to IPF.  - Can continue amiodarone  100 mg daily as above for VT suppression.  - Pulmonary followup with Dr. Theophilus.   9. Atrial fibrillation/flutter: He was in persistent atrial flutter, now s/p DCCV in 7/25.  He remains in NSR.   - Continue Eliquis  5 mg bid.  - Continue low dose amiodarone .  Will not increase with concern for amiodarone  lung toxicity.  10. Hypothyroidism: TSH higher.  - Increase Levoxyl  to 75 mcg daily.  Repeat TSH 1 month.  11. Mitral regurgitation: Suspect infarct-related MR.  Mitral regurgitation was at least moderate-severe on 7/25 echo.  MR looks worse than in the past and may contribute to worsened symptoms.    - I will arrange for TEE to assess severity of MR, he may be an mTEER candidate. We discussed risks/benefits and he agrees to the procedure.   Followup 1 month  I spent 41 minutes reviewing records, interviewing/examining patient, and managing orders.   Signed, [name], MD  12/18/2023   [facility] [address] [facility] [place] [address] [phone] (office) [phone] (fax)

## 2023-12-18 NOTE — Telephone Encounter (Signed)
 Advanced [facility] Patient Advocate Encounter  Patient left vm stating that he did not get Pradaxa  from [employer] order. Called and spoke with [employer]. Medication was delivered on 07/24 to the patients mailbox.  Called and spoke with the patient. He was able to check when he got home and realized that he did receive the Pradaxa . It was just the generic version. Confirmed with him that it was the same thing.  Almarie JULIANNA Pa, CPhT

## 2023-12-19 ENCOUNTER — Other Ambulatory Visit: Payer: Self-pay

## 2023-12-19 DIAGNOSIS — I739 Peripheral vascular disease, unspecified: Secondary | ICD-10-CM

## 2023-12-19 DIAGNOSIS — I779 Disorder of arteries and arterioles, unspecified: Secondary | ICD-10-CM

## 2023-12-22 NOTE — Progress Notes (Unsigned)
  Cardiology Office Note:   Date:  12/22/2023  ID:  Nathan Pruitt, DOB 1945/10/27, MRN [id number]  Primary Cardiologist: [name], MD Electrophysiologist: Soyla Gladis Norton, MD   History of Present Illness:   Nathan Pruitt is a 78 y.o. male with h/o chronic systolic CHF, VT, CAD, AF/AFL, severe MR, and 1st degree AV block  seen today for routine electrophysiology followup.   Since last being seen in our clinic the patient reports doing OK. He states his energy is poor, but that he can work in his garage for about 2 hours (previously was only 30-60 minutes). He does have some lightheadedness an imbalance. Mild SOB with more than moderate exertion.  No chest pain.   Review of systems complete and found to be negative unless listed in HPI.    EP Information / Studies Reviewed:    EKG is not ordered today. EKG from 12/18/2023 reviewed which showed AV dual paced rhythm at 62 bpm   Arrhythmia/Device History Medtronic Dual Chamber ICD implanted 11/2016 for VT, CHF  S/p AFL ablation 2010  S/p Barostim implant 06/25/2023   Barostim Interrogation- Performed personally and reviewed in detail today,  See scanned report  ICD/PPM interrogation - Performed personally and reviewed in detail today.    Physical Exam:   VS:  There were no vitals taken for this visit.   Wt Readings from Last 3 Encounters:  12/18/23 130 lb 6.4 oz (59.1 kg)  11/25/23 139 lb (63 kg)  11/17/23 144 lb 3.2 oz (65.4 kg)     GEN: Well nourished, well developed in no acute distress NECK: No JVD; No carotid bruits CARDIAC: Regular rate and rhythm, no murmurs, rubs, gallops RESPIRATORY:  Clear to auscultation without rales, wheezing or rhonchi  ABDOMEN: Soft, non-tender, non-distended EXTREMITIES:  Trace edema; No deformity   ASSESSMENT AND PLAN:    Chronic systolic CHF s/p Medtronic Defibrillator and Barostim implantation NYHA III symptoms.   Device programmed at 7.0 ma for chronic settings  Device  impedence stable. Pt goals are to increase his functional status and to be less dizzy. He feels like his energy is worse, but is able to do more and for longer by his history.    Normal device function - Battery good through 12/2028. See scanned report.  Persistent AF S/p [facility] 7/25  VT Quiescent on amiodarone  100 mg daily  CAD No s/s of ischemia.      Severe MR Scheduled for TEE 9/3 with Dr. Shuck    Disposition:   Follow up with EP Team in in 6 months for device management.  Signed, Ozell Prentice Passey, PA-C

## 2023-12-23 ENCOUNTER — Ambulatory Visit: Attending: Student | Admitting: Student

## 2023-12-23 ENCOUNTER — Encounter: Payer: Self-pay | Admitting: Student

## 2023-12-23 VITALS — BP 97/62 | Ht 70.0 in | Wt 132.0 lb

## 2023-12-23 DIAGNOSIS — I4891 Unspecified atrial fibrillation: Secondary | ICD-10-CM | POA: Diagnosis not present

## 2023-12-23 DIAGNOSIS — I5022 Chronic systolic (congestive) heart failure: Secondary | ICD-10-CM | POA: Diagnosis not present

## 2023-12-23 DIAGNOSIS — I340 Nonrheumatic mitral (valve) insufficiency: Secondary | ICD-10-CM

## 2023-12-23 DIAGNOSIS — I4819 Other persistent atrial fibrillation: Secondary | ICD-10-CM | POA: Diagnosis not present

## 2023-12-23 LAB — CUP PACEART INCLINIC DEVICE CHECK
Battery Remaining Longevity: 52 mo
Battery Voltage: 2.97 V
Brady Statistic AP VP Percent: 81.65 %
Brady Statistic AP VS Percent: 0.1 %
Brady Statistic AS VP Percent: 18.13 %
Brady Statistic AS VS Percent: 0.12 %
Brady Statistic RA Percent Paced: 76.39 %
Brady Statistic RV Percent Paced: 97.8 %
Date Time Interrogation Session: 20250819090358
HighPow Impedance: 35 Ohm
HighPow Impedance: 49 Ohm
Implantable Lead Connection Status: 753985
Implantable Lead Connection Status: 753985
Implantable Lead Connection Status: 753985
Implantable Lead Implant Date: 20100520
Implantable Lead Implant Date: 20100520
Implantable Lead Implant Date: 20230602
Implantable Lead Location: 753858
Implantable Lead Location: 753859
Implantable Lead Location: 753860
Implantable Lead Model: 4598
Implantable Lead Model: 5076
Implantable Lead Model: 6947
Implantable Pulse Generator Implant Date: 20230602
Lead Channel Impedance Value: 147.097
Lead Channel Impedance Value: 147.097
Lead Channel Impedance Value: 152 Ohm
Lead Channel Impedance Value: 155.455
Lead Channel Impedance Value: 160.941
Lead Channel Impedance Value: 285 Ohm
Lead Channel Impedance Value: 285 Ohm
Lead Channel Impedance Value: 304 Ohm
Lead Channel Impedance Value: 304 Ohm
Lead Channel Impedance Value: 342 Ohm
Lead Channel Impedance Value: 342 Ohm
Lead Channel Impedance Value: 399 Ohm
Lead Channel Impedance Value: 456 Ohm
Lead Channel Impedance Value: 532 Ohm
Lead Channel Impedance Value: 532 Ohm
Lead Channel Impedance Value: 532 Ohm
Lead Channel Impedance Value: 551 Ohm
Lead Channel Impedance Value: 589 Ohm
Lead Channel Pacing Threshold Amplitude: 0.625 V
Lead Channel Pacing Threshold Amplitude: 0.875 V
Lead Channel Pacing Threshold Amplitude: 1.375 V
Lead Channel Pacing Threshold Pulse Width: 0.4 ms
Lead Channel Pacing Threshold Pulse Width: 0.4 ms
Lead Channel Pacing Threshold Pulse Width: 0.4 ms
Lead Channel Sensing Intrinsic Amplitude: 2.125 mV
Lead Channel Sensing Intrinsic Amplitude: 2.5 mV
Lead Channel Sensing Intrinsic Amplitude: 2.75 mV
Lead Channel Sensing Intrinsic Amplitude: 2.75 mV
Lead Channel Setting Pacing Amplitude: 1.25 V
Lead Channel Setting Pacing Amplitude: 1.5 V
Lead Channel Setting Pacing Amplitude: 2.25 V
Lead Channel Setting Pacing Pulse Width: 0.4 ms
Lead Channel Setting Pacing Pulse Width: 0.8 ms
Lead Channel Setting Sensing Sensitivity: 0.3 mV

## 2023-12-23 NOTE — Patient Instructions (Signed)
 Medication Instructions:  Your physician recommends that you continue on your current medications as directed. Please refer to the Current Medication list given to you today.  *If you need a refill on your cardiac medications before your next appointment, please call your pharmacy*  Lab Work: None ordered If you have labs (blood work) drawn today and your tests are completely normal, you will receive your results only by: MyChart Message (if you have MyChart) OR A paper copy in the mail If you have any lab test that is abnormal or we need to change your treatment, we will call you to review the results.  Follow-Up: At [facility], you and your health needs are our priority.  As part of our continuing mission to provide you with exceptional heart care, our providers are all part of one team.  This team includes your primary Cardiologist (physician) and Advanced Practice Providers or APPs (Physician Assistants and Nurse Practitioners) who all work together to provide you with the care you need, when you need it.  Your next appointment:   6 month(s)  Provider:   Bambi Lever "Michaelle Adolphus, PA-C

## 2023-12-25 ENCOUNTER — Ambulatory Visit: Payer: Self-pay | Admitting: Cardiology

## 2023-12-26 ENCOUNTER — Other Ambulatory Visit (HOSPITAL_COMMUNITY): Payer: Self-pay | Admitting: Cardiology

## 2024-01-06 NOTE — Progress Notes (Signed)
 Pt called for pre procedure instructions. Arrival time 0730 NPO after midnight explained Instructed to take am meds with sip of water.   Instructed pt need for ride home tomorrow and have responsible adult with them for 24 hrs post procedure.

## 2024-01-06 NOTE — Anesthesia Preprocedure Evaluation (Signed)
 Anesthesia Evaluation  Patient identified by MRN, date of birth, ID band Patient awake    Reviewed: Allergy & Precautions, NPO status , Patient's Chart, lab work & pertinent test results, reviewed documented beta blocker date and time   Airway Mallampati: II  TM Distance: >3 FB Neck ROM: Full    Dental  (+) Teeth Intact, Dental Advisory Given,    Pulmonary former smoker   Pulmonary exam normal breath sounds clear to auscultation       Cardiovascular hypertension, Pt. on home beta blockers and Pt. on medications (-) angina + CAD, + Past MI, + Cardiac Stents, + Peripheral Vascular Disease (s/p L-to-R fem-fem bypass) and +CHF  + dysrhythmias Atrial Fibrillation and Ventricular Tachycardia + Cardiac Defibrillator + Valvular Problems/Murmurs MR  Rhythm:Regular Rate:Normal + Systolic murmurs Echo 12/03/23: 1. Left ventricular ejection fraction, by estimation, is 20 to 25%. Left  ventricular ejection fraction by 3D volume is 23 %. The left ventricle has  severely decreased function. The left ventricle demonstrates regional wall  motion abnormalities (see  scoring diagram/findings for description). The left ventricular internal  cavity size was moderately dilated. There is mild asymmetric left  ventricular hypertrophy of the septal segment. Left ventricular diastolic  parameters are indeterminate. The average   left ventricular global longitudinal strain is -3.8 %. The global  longitudinal strain is abnormal.   2. Right ventricular systolic function is moderate-severely reduced. The  right ventricular size is mildly enlarged. There is moderately elevated  pulmonary artery systolic pressure. The estimated right ventricular  systolic pressure is 55.2 mmHg.   3. Left atrial size was severely dilated.   4. Right atrial size was mildly dilated.   5. The mitral valve is abnormal. Moderate to moderate-severe mitral valve  regurgitation,  mechanism likely related to leaflet tethering from RWMA. No  evidence of mitral stenosis.   6. The aortic valve is tricuspid. There is mild calcification of the  aortic valve. Aortic valve regurgitation is moderate. Aortic valve  sclerosis/calcification is present, without any evidence of aortic  stenosis.   7. The inferior vena cava is dilated in size with <50% respiratory  variability, suggesting right atrial pressure of 15 mmHg.   Comparison(s): Prior images reviewed side by side. MR and AI appear  greater on today's study compared to prior.     Neuro/Psych  Headaches, neg Seizures PSYCHIATRIC DISORDERS Anxiety      Neuromuscular disease    GI/Hepatic negative GI ROS, Neg liver ROS,,,  Endo/Other  negative endocrine ROSneg diabetes    Renal/GU Renal InsufficiencyRenal disease     Musculoskeletal  (+) Arthritis , Rheumatoid disorders,    Abdominal   Peds  Hematology negative hematology ROS (+)   Anesthesia Other Findings   Reproductive/Obstetrics                              Anesthesia Physical Anesthesia Plan  ASA: 4  Anesthesia Plan: MAC   Post-op Pain Management: Minimal or no pain anticipated   Induction: Intravenous  PONV Risk Score and Plan: 1 and TIVA  Airway Management Planned: Natural Airway and Simple Face Mask  Additional Equipment:   Intra-op Plan:   Post-operative Plan:   Informed Consent: I have reviewed the patients History and Physical, chart, labs and discussed the procedure including the risks, benefits and alternatives for the proposed anesthesia with the patient or authorized representative who has indicated his/her understanding and acceptance.  Dental advisory given  Plan Discussed with: CRNA  Anesthesia Plan Comments:          Anesthesia Quick Evaluation

## 2024-01-07 ENCOUNTER — Ambulatory Visit (HOSPITAL_COMMUNITY): Admitting: Anesthesiology

## 2024-01-07 ENCOUNTER — Ambulatory Visit (HOSPITAL_BASED_OUTPATIENT_CLINIC_OR_DEPARTMENT_OTHER): Admitting: Anesthesiology

## 2024-01-07 ENCOUNTER — Other Ambulatory Visit (HOSPITAL_COMMUNITY): Payer: Self-pay

## 2024-01-07 ENCOUNTER — Ambulatory Visit (HOSPITAL_COMMUNITY)
Admission: RE | Admit: 2024-01-07 | Discharge: 2024-01-07 | Disposition: A | Discharge status: Home / Self Care | Attending: Cardiology | Admitting: Cardiology

## 2024-01-07 ENCOUNTER — Encounter (HOSPITAL_COMMUNITY)
Admission: RE | Disposition: A | Discharge status: Home / Self Care | Payer: Self-pay | Source: Home / Self Care | Attending: Cardiology

## 2024-01-07 ENCOUNTER — Other Ambulatory Visit: Payer: Self-pay

## 2024-01-07 ENCOUNTER — Ambulatory Visit (HOSPITAL_COMMUNITY)

## 2024-01-07 ENCOUNTER — Encounter (HOSPITAL_COMMUNITY): Payer: Self-pay | Admitting: Cardiology

## 2024-01-07 DIAGNOSIS — I252 Old myocardial infarction: Secondary | ICD-10-CM | POA: Insufficient documentation

## 2024-01-07 DIAGNOSIS — Z95810 Presence of automatic (implantable) cardiac defibrillator: Secondary | ICD-10-CM | POA: Insufficient documentation

## 2024-01-07 DIAGNOSIS — E785 Hyperlipidemia, unspecified: Secondary | ICD-10-CM | POA: Insufficient documentation

## 2024-01-07 DIAGNOSIS — Z79890 Hormone replacement therapy (postmenopausal): Secondary | ICD-10-CM | POA: Diagnosis not present

## 2024-01-07 DIAGNOSIS — I340 Nonrheumatic mitral (valve) insufficiency: Secondary | ICD-10-CM

## 2024-01-07 DIAGNOSIS — J84112 Idiopathic pulmonary fibrosis: Secondary | ICD-10-CM | POA: Diagnosis not present

## 2024-01-07 DIAGNOSIS — M069 Rheumatoid arthritis, unspecified: Secondary | ICD-10-CM | POA: Diagnosis not present

## 2024-01-07 DIAGNOSIS — I739 Peripheral vascular disease, unspecified: Secondary | ICD-10-CM | POA: Diagnosis not present

## 2024-01-07 DIAGNOSIS — I472 Ventricular tachycardia, unspecified: Secondary | ICD-10-CM | POA: Diagnosis not present

## 2024-01-07 DIAGNOSIS — I4819 Other persistent atrial fibrillation: Secondary | ICD-10-CM | POA: Insufficient documentation

## 2024-01-07 DIAGNOSIS — Z79899 Other long term (current) drug therapy: Secondary | ICD-10-CM | POA: Insufficient documentation

## 2024-01-07 DIAGNOSIS — Z7901 Long term (current) use of anticoagulants: Secondary | ICD-10-CM | POA: Insufficient documentation

## 2024-01-07 DIAGNOSIS — I080 Rheumatic disorders of both mitral and aortic valves: Secondary | ICD-10-CM | POA: Diagnosis not present

## 2024-01-07 DIAGNOSIS — Z955 Presence of coronary angioplasty implant and graft: Secondary | ICD-10-CM | POA: Diagnosis not present

## 2024-01-07 DIAGNOSIS — I2510 Atherosclerotic heart disease of native coronary artery without angina pectoris: Secondary | ICD-10-CM | POA: Diagnosis not present

## 2024-01-07 DIAGNOSIS — Z87891 Personal history of nicotine dependence: Secondary | ICD-10-CM | POA: Diagnosis not present

## 2024-01-07 DIAGNOSIS — I255 Ischemic cardiomyopathy: Secondary | ICD-10-CM | POA: Insufficient documentation

## 2024-01-07 DIAGNOSIS — E039 Hypothyroidism, unspecified: Secondary | ICD-10-CM | POA: Diagnosis not present

## 2024-01-07 DIAGNOSIS — I130 Hypertensive heart and chronic kidney disease with heart failure and stage 1 through stage 4 chronic kidney disease, or unspecified chronic kidney disease: Secondary | ICD-10-CM | POA: Diagnosis not present

## 2024-01-07 DIAGNOSIS — I714 Abdominal aortic aneurysm, without rupture, unspecified: Secondary | ICD-10-CM | POA: Diagnosis not present

## 2024-01-07 DIAGNOSIS — I5022 Chronic systolic (congestive) heart failure: Secondary | ICD-10-CM | POA: Insufficient documentation

## 2024-01-07 DIAGNOSIS — N183 Chronic kidney disease, stage 3 unspecified: Secondary | ICD-10-CM | POA: Insufficient documentation

## 2024-01-07 DIAGNOSIS — I4892 Unspecified atrial flutter: Secondary | ICD-10-CM | POA: Diagnosis not present

## 2024-01-07 DIAGNOSIS — I110 Hypertensive heart disease with heart failure: Secondary | ICD-10-CM

## 2024-01-07 HISTORY — PX: TRANSESOPHAGEAL ECHOCARDIOGRAM (CATH LAB): EP1270

## 2024-01-07 LAB — ECHO TEE: S' Lateral: 5.6 cm

## 2024-01-07 SURGERY — TRANSESOPHAGEAL ECHOCARDIOGRAM (TEE) (CATHLAB)
Anesthesia: Monitor Anesthesia Care

## 2024-01-07 MED ORDER — BUTAMBEN-TETRACAINE-BENZOCAINE 2-2-14 % EX AERO
INHALATION_SPRAY | CUTANEOUS | Status: DC | PRN
  Administered 2024-01-07: 1 via TOPICAL

## 2024-01-07 MED ORDER — LIDOCAINE 2% (20 MG/ML) 5 ML SYRINGE
INTRAMUSCULAR | Status: DC | PRN
  Administered 2024-01-07: 60 mg via INTRAVENOUS

## 2024-01-07 MED ORDER — SODIUM CHLORIDE 0.9 % IV SOLN: INTRAVENOUS | Status: DC

## 2024-01-07 MED ORDER — PROPOFOL 10 MG/ML IV BOLUS
INTRAVENOUS | Status: DC | PRN
  Administered 2024-01-07 (×3): 10 mg via INTRAVENOUS
  Administered 2024-01-07 (×2): 20 mg via INTRAVENOUS
  Administered 2024-01-07: 10 mg via INTRAVENOUS
  Administered 2024-01-07 (×2): 20 mg via INTRAVENOUS
  Administered 2024-01-07 (×4): 10 mg via INTRAVENOUS

## 2024-01-07 MED ORDER — PHENYLEPHRINE 80 MCG/ML (10ML) SYRINGE FOR IV PUSH (FOR BLOOD PRESSURE SUPPORT)
PREFILLED_SYRINGE | INTRAVENOUS | Status: DC | PRN
  Administered 2024-01-07 (×6): 80 ug via INTRAVENOUS
  Administered 2024-01-07: 160 ug via INTRAVENOUS
  Administered 2024-01-07 (×2): 80 ug via INTRAVENOUS

## 2024-01-07 NOTE — CV Procedure (Signed)
 Procedure: TEE  Sedation: Per anesthesiology  Indication: Mitral regurgitation  Findings: Please see echo section for full report. TEE images were difficult.  Moderate LV dilation with mild LV hypertrophy.  There was a basal inferior/inferolateral aneurysm with akinesis of the mid to apical inferolateral wall and severe hypokinesis of the anterolateral wall.  LV EF in the 25-30% range.  Mildly dilated RV with moderate RV systolic dysfunction.  ICD in RV.  Mild left atrial enlargement, no LA appendage thrombus.  Mild right atrial enlargement.  No PFO/ASD by color doppler.  Mild TR.  Trileaflet aortic valve with mild calcification, mild-moderate aortic regurgitation, no aortic stenosis.  There is mitral regurgitation, suspect infarct-related.  Visually, moderate-severe mitral regurgitation but quantifies to severe by PISA.  Normal caliber thoracic aorta.    Mitral regurgitation appears severe.   [name] 01/07/2024 9:47 AM

## 2024-01-07 NOTE — Anesthesia Postprocedure Evaluation (Signed)
 Anesthesia Post Note  Patient: Nathan Pruitt  Procedure(s) Performed: TRANSESOPHAGEAL ECHOCARDIOGRAM     Patient location during evaluation: Cath Lab Anesthesia Type: MAC Level of consciousness: awake and alert Pain management: pain level controlled Vital Signs Assessment: post-procedure vital signs reviewed and stable Respiratory status: spontaneous breathing, nonlabored ventilation, respiratory function stable and patient connected to nasal cannula oxygen Cardiovascular status: stable and blood pressure returned to baseline Postop Assessment: no apparent nausea or vomiting Anesthetic complications: no   No notable events documented.  Last Vitals:  Vitals:   01/07/24 1010 01/07/24 1015  BP: (!) 114/57 105/79  Pulse: (!) 59 62  Resp: 19 19  Temp:    SpO2: 97% 100%    Last Pain:  Vitals:   01/07/24 1015  TempSrc:   PainSc: 0-No pain                 Garnette FORBES [place]

## 2024-01-07 NOTE — Interval H&P Note (Signed)
 History and Physical Interval Note:  01/07/2024 8:51 AM  Nathan Pruitt  has presented today for surgery, with the diagnosis of mitral regurgitation.  The various methods of treatment have been discussed with the patient and family. After consideration of risks, benefits and other options for treatment, the patient has consented to  Procedure(s): TRANSESOPHAGEAL ECHOCARDIOGRAM (N/A) as a surgical intervention.  The patient's history has been reviewed, patient examined, no change in status, stable for surgery.  I have reviewed the patient's chart and labs.  Questions were answered to the patient's satisfaction.     Torey Reinard [employer]

## 2024-01-07 NOTE — Transfer of Care (Signed)
 Immediate Anesthesia Transfer of Care Note  Patient: Nathan Pruitt  Procedure(s) Performed: TRANSESOPHAGEAL ECHOCARDIOGRAM  Patient Location: PACU and Cath Lab  Anesthesia Type:MAC  Level of Consciousness: awake  Airway & Oxygen Therapy: Patient Spontanous Breathing and Patient connected to nasal cannula oxygen  Post-op Assessment: Report given to RN and Post -op Vital signs reviewed and stable  Post vital signs: stable  Last Vitals:  Vitals Value Taken Time  BP    Temp    Pulse    Resp    SpO2      Last Pain:  Vitals:   01/07/24 0813  TempSrc:   PainSc: 0-No pain         Complications: No notable events documented.

## 2024-01-08 ENCOUNTER — Ambulatory Visit: Payer: Medicare Other

## 2024-01-08 DIAGNOSIS — I4891 Unspecified atrial fibrillation: Secondary | ICD-10-CM

## 2024-01-09 ENCOUNTER — Ambulatory Visit: Payer: Self-pay | Admitting: Cardiology

## 2024-01-09 ENCOUNTER — Telehealth: Payer: Self-pay

## 2024-01-09 LAB — CUP PACEART REMOTE DEVICE CHECK
Battery Remaining Longevity: 51 mo
Battery Voltage: 2.97 V
Brady Statistic AP VP Percent: 95.27 %
Brady Statistic AP VS Percent: 0.09 %
Brady Statistic AS VP Percent: 4.61 %
Brady Statistic AS VS Percent: 0.02 %
Brady Statistic RA Percent Paced: 91.51 %
Brady Statistic RV Percent Paced: 97.81 %
Date Time Interrogation Session: 20250905033505
HighPow Impedance: 33 Ohm
HighPow Impedance: 40 Ohm
Implantable Lead Connection Status: 753985
Implantable Lead Connection Status: 753985
Implantable Lead Connection Status: 753985
Implantable Lead Implant Date: 20100520
Implantable Lead Implant Date: 20100520
Implantable Lead Implant Date: 20230602
Implantable Lead Location: 753858
Implantable Lead Location: 753859
Implantable Lead Location: 753860
Implantable Lead Model: 4598
Implantable Lead Model: 5076
Implantable Lead Model: 6947
Implantable Pulse Generator Implant Date: 20230602
Lead Channel Impedance Value: 152 Ohm
Lead Channel Impedance Value: 160.941
Lead Channel Impedance Value: 160.941
Lead Channel Impedance Value: 160.941
Lead Channel Impedance Value: 171 Ohm
Lead Channel Impedance Value: 247 Ohm
Lead Channel Impedance Value: 304 Ohm
Lead Channel Impedance Value: 304 Ohm
Lead Channel Impedance Value: 304 Ohm
Lead Channel Impedance Value: 342 Ohm
Lead Channel Impedance Value: 342 Ohm
Lead Channel Impedance Value: 399 Ohm
Lead Channel Impedance Value: 418 Ohm
Lead Channel Impedance Value: 513 Ohm
Lead Channel Impedance Value: 532 Ohm
Lead Channel Impedance Value: 532 Ohm
Lead Channel Impedance Value: 532 Ohm
Lead Channel Impedance Value: 551 Ohm
Lead Channel Pacing Threshold Amplitude: 0.625 V
Lead Channel Pacing Threshold Amplitude: 0.875 V
Lead Channel Pacing Threshold Amplitude: 1.25 V
Lead Channel Pacing Threshold Pulse Width: 0.4 ms
Lead Channel Pacing Threshold Pulse Width: 0.4 ms
Lead Channel Pacing Threshold Pulse Width: 0.4 ms
Lead Channel Sensing Intrinsic Amplitude: 0.5 mV
Lead Channel Sensing Intrinsic Amplitude: 0.5 mV
Lead Channel Sensing Intrinsic Amplitude: 2.75 mV
Lead Channel Sensing Intrinsic Amplitude: 2.75 mV
Lead Channel Setting Pacing Amplitude: 1.25 V
Lead Channel Setting Pacing Amplitude: 1.5 V
Lead Channel Setting Pacing Amplitude: 2.25 V
Lead Channel Setting Pacing Pulse Width: 0.4 ms
Lead Channel Setting Pacing Pulse Width: 0.8 ms
Lead Channel Setting Sensing Sensitivity: 0.3 mV

## 2024-01-09 NOTE — Telephone Encounter (Signed)
 Comments: Dilated annulus resulting in tethered leaflets Broad centrally directed jet Suggest 1 P10 cental A2/P2

## 2024-01-12 NOTE — H&P (View-Only) (Signed)
 Patient ID: Nathan Pruitt MRN: [id number] DOB/AGE: Nov 14, 1945 78 y.o.  Primary Care Physician:Holt, Marcellus RAMAN, MD AHF Cardiologist: Rolan  CC:  Mitral valvular disease management     FOCUSED PROBLEM LIST:   MR  Mild to moderate MR, EF 25 to 30% TTE 2024 RHC cardiac output 3.7, cardiac index 2.1, PA PI 12, mean wedge 11 mmHg; V waves to 18 mmHg 2024 Moderate to severe MR, EF 20 to 25% TTE July 2025 Severe MR, EF 25 to 30% TTE September 2025 CAD Remote inferior MI 1993 >> CTO RCA VT 2009 >> ICD VT 2018 >> subacute occlusion ramus >> DES x 3 DES x 1 OM1 2021 Ischemic cardiomyopathy EF 35 to 40% inferolateral and anterolateral wall motion abnormality, moderate MR TTE 2019 EF 25 to 30%, inferolateral aneurysm and AK of mid to apical inferolateral wall with severe HK anterolateral wall DE 2025 VT Status post ICD with CRT upgrade 2023 Atrial flutter Status post ablation 2010 PAF On Eliquis  Hypertension  Hyperlipidemia Aortic atherosclerosis Chest CT 2021 CKD stage IV Idiopathic pulmonary fibrosis Followed by pulmonology Defers antifibrotic therapy PAD  Redo of left to right fem-fem bypass graft 2009  September 2025:  Patient consents to use of AI scribe. The patient is a 78 year old male with a complex medical history as detailed above referred by Dr. Rolan for recommendations regarding his severe mitral digitation.  He experiences significant fatigue and weakness, progressively worsening over the past year. He can only engage in activities for about 45 minutes to an hour before needing to rest due to weakness, not shortness of breath. He describes difficulty with tasks such as starting a generator, loading the dishwasher, and making the bed, often requiring breaks every 15 minutes. He also reports instability and balance issues, needing to catch himself to prevent falls.  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  He has a history of kidney issues and is followed by lung doctors for fibrosis. No shortness of breath at rest but experiences it with exertion, such as carrying groceries up stairs. He sleeps on his side with a body pillow and two additional pillows for support, primarily due to back issues rather than breathing difficulties.  He has a history of a femoral bypass, performed twice, with the last procedure in 2009. He experiences swelling in the pads of his feet, which he attributes to the bypass surgery. He follows up with vascular surgery annually.  He reports a decreased appetite and gets full quickly when eating. He has been experiencing bruising and bleeding, which he attributes to his blood thinner medication. He is currently on Pradaxa  and has discontinued clopidogrel .  Denies any dental issues.      Past Medical History:  Diagnosis Date   Abnormal PFTs 11/2009   FVC 74%, FEV1 80%, ratio 75%, TLC 78%, DLCO 68%. this suggests a mild restrictive and obstructive defect. pt did have a response to bronchodilator. these PFTs were significantly better than the report from Dr. Mardee in [place] done prior.    AICD (automatic cardioverter/defibrillator) present 2010   Anxiety    Atrial flutter (HCC)    s/p isthmus ablation 5/10   CAD (coronary artery disease)    hx of silent MI in 1993. likely an inferior MI. hx of 2D cardiogram in 3/09 showing EF of 40%. hx of myoview in [place] 3/09-nml. presented to [facility] 5/10 with VT and mildly elevated cardiac  enzymes LHC (5/10): inferobasal dyskinesis with EF 35-40%. was chronic total occlusion of mid RCA with good collaterals. luminals LCA. this does not appear to be an acute cause of the 5/10 event   Cancer (HCC)    skin   Cervical radiculopathy    CHF (congestive heart failure) (HCC)    Chronic lower back pain    CKD (chronic kidney disease)    HLD (hyperlipidemia)    HTN (hypertension)    Ischemic  cardiomyopathy    EF 35-40% by LV-gram 5/10 with inferobasal dyskinesis. echo 5/10 showed EF 40% w/mild LVH, no sig. MR, inferobasal and posterobasal akinesis. echo (7/11): EF 50%, mild LVH, basal-mid inferoposterior akenesis   Migraine    visual migraine; maybe 12/year (07/16/2016)   PAD (peripheral artery disease) (HCC)    s/p L-to-R fem-fem bypass performed by Dr. Eliza at [facility] in 2009    Rheumatoid arthritis [facility])    on leflunomide    Silent myocardial infarction [facility]) 1993   silent   Tobacco abuse    47 pack year hx; quit 8/09   Ventricular tachycardia (HCC)    likely scar-mediated. VT storm 5/10 suppressed by amiodarone  and Coreg . He has duel chamber Medtronic ICD    Past Surgical History:  Procedure Laterality Date   BIV UPGRADE N/A 10/05/2021   Procedure: DOLPH LLANO;  Surgeon: Inocencio Soyla Lunger, MD;  Location: [facility] INVASIVE CV LAB;  Service: Cardiovascular;  Laterality: N/A;   CARDIAC DEFIBRILLATOR PLACEMENT  09/22/2008   CARDIOVERSION N/A 11/25/2023   Procedure: CARDIOVERSION;  Surgeon: Rolan Ezra RAMAN, MD;  Location: [facility] INVASIVE CV LAB;  Service: Cardiovascular;  Laterality: N/A;   CATARACT EXTRACTION W/ INTRAOCULAR LENS  IMPLANT, BILATERAL Bilateral 05/2016 - 06/2016   CORONARY BALLOON ANGIOPLASTY N/A 01/28/2020   Procedure: CORONARY BALLOON ANGIOPLASTY;  Surgeon: Dann Candyce RAMAN, MD;  Location: [facility] INVASIVE CV LAB;  Service: Cardiovascular;  Laterality: N/A;   CORONARY CTO INTERVENTION  07/16/2016   CORONARY CTO INTERVENTION N/A 07/16/2016   Procedure: Coronary CTO Intervention;  Surgeon: Victory LELON Sharps, MD;  Location: [facility] INVASIVE CV LAB;  Service: Cardiovascular;  Laterality: N/A;   EP IMPLANTABLE DEVICE  09/22/08   Medtronic, ICD Model Number:  [id number], ICD Serial Number: [id number]   FEMORAL ARTERY - FEMORAL ARTERY BYPASS GRAFT  march 2009   left to right bypass, first @ [facility], second at [facility] by Dr Eliza   ICD GENERATOR CHANGEOUT  N/A 11/12/2016   Procedure: ICD Generator Changeout;  Surgeon: Kelsie Agent, MD;  Location: [facility] INVASIVE CV LAB;  Service: Cardiovascular;  Laterality: N/A;   RIGHT HEART CATH N/A 03/18/2023   Procedure: RIGHT HEART CATH;  Surgeon: Rolan Ezra RAMAN, MD;  Location: [facility] INVASIVE CV LAB;  Service: Cardiovascular;  Laterality: N/A;   RIGHT/LEFT HEART CATH AND CORONARY ANGIOGRAPHY N/A 07/12/2016   Procedure: Right/Left Heart Cath and Coronary Angiography;  Surgeon: Ezra RAMAN Rolan, MD;  Location: [facility] INVASIVE CV LAB;  Service: Cardiovascular;  Laterality: N/A;   RIGHT/LEFT HEART CATH AND CORONARY ANGIOGRAPHY N/A 01/28/2020   Procedure: RIGHT/LEFT HEART CATH AND CORONARY ANGIOGRAPHY;  Surgeon: Rolan Ezra RAMAN, MD;  Location: [facility] INVASIVE CV LAB;  Service: Cardiovascular;  Laterality: N/A;   TONSILLECTOMY     TRANSESOPHAGEAL ECHOCARDIOGRAM (CATH LAB) N/A 01/07/2024   Procedure: TRANSESOPHAGEAL ECHOCARDIOGRAM;  Surgeon: Rolan Ezra RAMAN, MD;  Location: [facility] INVASIVE CV LAB;  Service: Cardiovascular;  Laterality: N/A;   VASECTOMY     VENTRICULAR ABLATION SURGERY  09/2008  Family History  Problem Relation Age of Onset   Gastric cancer Mother    Throat cancer Father    Peripheral vascular disease Father    Heart attack Brother 4    Social History   Socioeconomic History   Marital status: Single    Spouse name: Not on file   Number of children: 0   Years of education: 12   Highest education level: Some college, no degree  Occupational History   Not on file  Tobacco Use   Smoking status: Former    Current packs/day: 0.00    Average packs/day: 1 pack/day for 47.0 years (47.0 ttl pk-yrs)    Types: Cigarettes    Start date: 28    Quit date: 2009    Years since quitting: 16.7    Passive exposure: Never   Smokeless tobacco: Never   Tobacco comments:    Former smoker 10/02/23  Vaping Use   Vaping status: Never Used  Substance and Sexual Activity   Alcohol use: Yes    Comment: 07/15/2016  nothing for the last couple years 04/18/21 very seldom   Drug use: No    Comment: prior    Sexual activity: Not on file  Other Topics Concern   Not on file  Social History Narrative   Single; gets minimal exercise; 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 originally from [place]   Caffeine 3/4 c daily   Social Drivers of Corporate investment banker Strain: Low Risk  (07/19/2021)   Overall Financial Resource Strain (CARDIA)    Difficulty of Paying Living Expenses: Not very hard  Food Insecurity: No Food Insecurity (07/19/2021)   Hunger Vital Sign    Worried About Running Out of Food in the Last Year: Never true    Ran Out of Food in the Last Year: Never true  Transportation Needs: No Transportation Needs (07/19/2021)   PRAPARE - Administrator, Civil Service (Medical): No    Lack of Transportation (Non-Medical): No  Physical Activity: Not on file  Stress: Not on file  Social Connections: Not on file  Intimate Partner Violence: Not on file     Prior to Admission medications   Medication Sig Start Date End Date Taking? Authorizing Provider  acetaminophen  (TYLENOL ) 500 MG tablet Take 1,000 mg by mouth every 6 (six) hours as needed for moderate pain (pain score 4-6).    [provider]  amiodarone  (PACERONE ) 200 MG tablet Take 0.5 tablets (100 mg total) by mouth daily. 11/17/23   Rolan Ezra RAMAN, MD  carvedilol  (COREG ) 12.5 MG tablet Take 1 tablet by mouth twice daily 10/31/23   Glena Harlene HERO, FNP  chlorhexidine  (PERIDEX ) 0.12 % solution Use as directed 15 mLs in the mouth or throat daily as needed (irritation).  02/02/15   [provider]  cholecalciferol (VITAMIN D3) 25 MCG (1000 UNIT) tablet Take 1,000 Units by mouth daily.    [provider]  clopidogrel  (PLAVIX ) 75 MG tablet Take 75 mg by mouth daily.    [provider]  dabigatran  (PRADAXA ) 75 MG CAPS capsule Take 1 capsule (75 mg total) by mouth 2 (two) times daily. 11/21/23   Rolan Ezra RAMAN, MD   dapagliflozin  propanediol (FARXIGA ) 10 MG TABS tablet Take 1 tablet (10 mg total) by mouth daily before breakfast. 06/23/23   Rolan Ezra RAMAN, MD  eplerenone  (INSPRA ) 25 MG tablet Take 1 tablet by mouth once daily 10/31/23   Milford, Jessica M, FNP  furosemide  (LASIX ) 40 MG tablet  Take 1 tablet (40 mg total) by mouth 2 (two) times daily. 12/04/23   Rolan Ezra RAMAN, MD  leflunomide  (ARAVA ) 20 MG tablet Take 20 mg by mouth daily.    [provider]  levothyroxine  (SYNTHROID ) 50 MCG tablet Take 1 tablet (50 mcg total) by mouth daily before breakfast. 11/21/23   Rolan Ezra RAMAN, MD  Melatonin 10 MG TABS Take 10 mg by mouth at bedtime as needed (sleep).     [provider]  mineral oil-hydrophilic petrolatum  (AQUAPHOR) ointment Apply 1 Application topically daily as needed (skin cancer/Change dressing).    [provider]  simvastatin  (ZOCOR ) 20 MG tablet TAKE 1 TABLET BY MOUTH ONCE DAILY AT BEDTIME 05/13/23   Rolan Ezra RAMAN, MD    Allergies  Allergen Reactions   Atorvastatin Other (See Comments)    Muscle pain   Crestor [Rosuvastatin Calcium ] Other (See Comments)    Muscle pain   Enalapril  Other (See Comments)    Upset stomach   Lisinopril Cough   Losartan  Other (See Comments)    Muscle pain    Telmisartan  Other (See Comments)    Stomach ache    REVIEW OF SYSTEMS:  General: no fevers/chills/night sweats Eyes: no blurry vision, diplopia, or amaurosis ENT: no sore throat or hearing loss Resp: no cough, wheezing, or hemoptysis CV: no edema or palpitations GI: no abdominal pain, nausea, vomiting, diarrhea, or constipation GU: no dysuria, frequency, or hematuria Skin: no rash Neuro: no headache, numbness, tingling, or weakness of extremities Musculoskeletal: no joint pain or swelling Heme: no bleeding, DVT, or easy bruising Endo: no polydipsia or polyuria  BP (!) 106/52 (BP Location: Right Arm, Patient Position: Sitting, Cuff Size: Normal)   Pulse 68   Ht  5' 10 (1.778 m)   Wt 133 lb (60.3 kg)   BMI 19.08 kg/m   PHYSICAL EXAM: GEN:  AO x 3 in no acute distress HEENT: normal Dentition: Normal Neck: JVP normal. +2 carotid upstrokes without bruits. No thyromegaly. Lungs: equal expansion, clear bilaterally CV: Apex is discrete and nondisplaced, RRR without murmur or gallop Abd: soft, non-tender, non-distended; no bruit; positive bowel sounds Ext: no edema, ecchymoses, or cyanosis Vascular: 2+ femoral pulses, 2+ radial pulses       Skin: warm and dry without rash Neuro: CN II-XII grossly intact; motor and sensory grossly intact    DATA AND STUDIES:  EKG: 2025 AV pacing  EKG Interpretation Date/Time:    Ventricular Rate:    PR Interval:    QRS Duration:    QT Interval:    QTC Calculation:   R Axis:      Text Interpretation:          CARDIAC STUDIES: Refer to CV Procedures and Imaging Tabs  02/25/2023: B Natriuretic Peptide 1,104.4 10/31/2023: Hemoglobin 12.1; Platelets 187 11/17/2023: ALT 15; BUN 41; Creatinine, Ser 2.51; Potassium 4.2; Sodium 138; TSH 17.203   STS RISK CALCULATOR: pending  NHYA CLASS: 2     ASSESSMENT AND PLAN:   1. Nonrheumatic mitral valve regurgitation   2. Cardiomyopathy, ischemic   3. Coronary artery disease involving native coronary artery of native heart without angina pectoris   4. VT (ventricular tachycardia) (HCC)   5. Typical atrial flutter (HCC)   6. PAF (paroxysmal atrial fibrillation) (HCC)   7. Secondary hypercoagulable state (HCC)   8. Primary hypertension   9. Hyperlipidemia LDL goal <55   10. Aortic atherosclerosis (HCC)   11. CKD (chronic kidney disease) stage 4, GFR 15-29 ml/min (  HCC)   12. Pulmonary fibrosis (HCC)     Mitral regurgitation: We reviewed the patient's echocardiogram and TEE in our structural heart disease conference.  He has severe ischemic/infarction mitral regurgitation and NYHA class II symptoms of shortness of breath.  I had a long conversation with  the patient and he is not interested in surgery but would like to pursue a minimally invasive approach to improve his quality of life and symptoms.  We will proceed to mitral transcatheter edge-to-edge repair.  The patient would like this done in early October.  I do not think preprocedural coronary angiography with or without right heart catheterization is required and we will defer this. Ischemic cardiomyopathy: Continue Farxiga  10, eplerenone  25, Lasix  40, Coreg  12.5 twice daily CAD: Continue Pradaxa  75 twice daily, simvastatin  20, Coreg  12.5 VT status post ICD: Continue amiodarone  100, Coreg  12.5; followed by EP Typical atrial flutter: Status post ablation; continue Pradaxa  75 twice daily PAF: Does not seem to be in atrial fibrillation today.;  Continue Pradaxa  75 twice daily Secondary hypercoagulable state: Continue Pradaxa  75 twice daily; patient does have nuisance bruising but no significant or severe bleeding Hypertension: Well afterload reduced today; continue Coreg  12.5 twice daily, eplerenone  25 Hyperlipidemia: Continue simvastatin  20 Aortic atherosclerosis: Continue Pradaxa  75 twice daily, simvastatin  20 CKD stage IV: Continue Farxiga  10 Pulmonary fibrosis: Followed by pulmonary Peripheral vascular disease: Status post lower extremity redo femorofemoral bypass; followed by vascular surgery.  Has no claudication symptoms.  I have personally reviewed the patients imaging data as summarized above.  I have reviewed the natural history of mitral regurgitation with the patient and family members who are present today. We have discussed the limitations of medical therapy and the poor prognosis associated with symptomatic mitral regurgitation. We have also reviewed potential treatment options, including palliative medical therapy, conventional mitral surgery, and transcatheter mitral edge-to-edge repair. We discussed treatment options in the context of this patient's specific comorbid medical  conditions.   All of the patient's questions were answered today. Will make further recommendations based on the results of studies outlined above.   I spent 45 minutes reviewing all clinical data during and prior to this visit including all relevant imaging studies, laboratories, clinical information from other health systems and prior notes from both Cardiology and other specialties, interviewing the patient, conducting a complete physical examination, and coordinating care in order to formulate a comprehensive and personalized evaluation and treatment plan.   Nathan Quinter K Leiyah Maultsby, MD  01/15/2024 8:18 AM    [facility] [address] [place], [place], [place]  [address] Phone: [phone]; Fax: [phone]

## 2024-01-12 NOTE — Progress Notes (Unsigned)
 Patient ID: Nathan Pruitt MRN: [id number] DOB/AGE: Dec 03, 1945 78 y.o.  Primary Care Physician:Holt, Marcellus RAMAN, MD AHF Cardiologist: Rolan  CC:  Mitral valvular disease management     FOCUSED PROBLEM LIST:   MR  Mild to moderate MR, EF 25 to 30% TTE 2024 RHC cardiac output 3.7, cardiac index 2.1, PA PI 12, mean wedge 11 mmHg; V waves to 18 mmHg 2024 Moderate to severe MR, EF 20 to 25% TTE July 2025 Severe MR, EF 25 to 30% TTE September 2025 CAD Remote inferior MI 1993 >> CTO RCA VT 2018 >> subacute occlusion ramus >> DES x 3 DES x 1 OM1 2021 Ischemic cardiomyopathy EF 35 to 40% inferolateral and anterolateral wall motion abnormality, moderate MR TTE 2019 EF 25 to 30%, inferolateral aneurysm and AK of mid to apical inferolateral wall with severe HK anterolateral wall DE 2025 VT Status post ICD with CRT upgrade 2023 Atrial flutter Status post ablation 2010 PAF On Eliquis  Hypertension  Hyperlipidemia Aortic atherosclerosis Chest CT 2021 CKD stage IV Idiopathic pulmonary fibrosis Followed by pulmonology Defers antifibrotic therapy  September 2025:  Patient consents to use of AI scribe. The patient is a 78 year old male with a complex medical history as detailed above referred by Dr. Rolan for recommendations regarding his severe mitral digitation.       Past Medical History:  Diagnosis Date   Abnormal PFTs 11/2009   FVC 74%, FEV1 80%, ratio 75%, TLC 78%, DLCO 68%. this suggests a mild restrictive and obstructive defect. pt did have a response to bronchodilator. these PFTs were significantly better than the report from Dr. Mardee in [place] done prior.    AICD (automatic cardioverter/defibrillator) present 2010   Anxiety    Atrial flutter (HCC)    s/p isthmus ablation 5/10   CAD (coronary artery disease)    hx of silent MI in 1993. likely an inferior MI. hx of 2D cardiogram in 3/09 showing EF of 40%. hx of myoview in [place] 3/09-nml. presented to [facility] 5/10 with VT  and mildly elevated cardiac enzymes LHC (5/10): inferobasal dyskinesis with EF 35-40%. was chronic total occlusion of mid RCA with good collaterals. luminals LCA. this does not appear to be an acute cause of the 5/10 event   Cancer (HCC)    skin   Cervical radiculopathy    CHF (congestive heart failure) (HCC)    Chronic lower back pain    CKD (chronic kidney disease)    HLD (hyperlipidemia)    HTN (hypertension)    Ischemic cardiomyopathy    EF 35-40% by LV-gram 5/10 with inferobasal dyskinesis. echo 5/10 showed EF 40% w/mild LVH, no sig. MR, inferobasal and posterobasal akinesis. echo (7/11): EF 50%, mild LVH, basal-mid inferoposterior akenesis   Migraine    visual migraine; maybe 12/year (07/16/2016)   PAD (peripheral artery disease) (HCC)    s/p L-to-R fem-fem bypass performed by Dr. Eliza at [facility] in 2009    Rheumatoid arthritis [facility])    on leflunomide    Silent myocardial infarction [facility]) 1993   silent   Tobacco abuse    47 pack year hx; quit 8/09   Ventricular tachycardia (HCC)    likely scar-mediated. VT storm 5/10 suppressed by amiodarone  and Coreg . He has duel chamber Medtronic ICD    Past Surgical History:  Procedure Laterality Date   BIV UPGRADE N/A 10/05/2021   Procedure: DOLPH LLANO;  Surgeon: Inocencio Soyla Lunger, MD;  Location: [facility] INVASIVE CV LAB;  Service: Cardiovascular;  Laterality: N/A;  CARDIAC DEFIBRILLATOR PLACEMENT  09/22/2008   CARDIOVERSION N/A 11/25/2023   Procedure: CARDIOVERSION;  Surgeon: Rolan Ezra RAMAN, MD;  Location[facility] INVASIVE CV LAB;  Service: Cardiovascular;  Laterality: N/A;   CATARACT EXTRACTION W/ INTRAOCULAR LENS  IMPLANT, BILATERAL Bilateral 05/2016 - 06/2016   CORONARY BALLOON ANGIOPLASTY N/A 01/28/2020   Procedure: CORONARY BALLOON ANGIOPLASTY;  Surgeon: Dann Candyce RAMAN, MD;  Location: [facility] INVASIVE CV LAB;  Service: Cardiovascular;  Laterality: N/A;   CORONARY CTO INTERVENTION  07/16/2016   CORONARY CTO INTERVENTION N/A 07/16/2016    Procedure: Coronary CTO Intervention;  Surgeon: Victory LELON Sharps, MD;  Location: [facility] INVASIVE CV LAB;  Service: Cardiovascular;  Laterality: N/A;   EP IMPLANTABLE DEVICE  09/22/08   Medtronic, ICD Model Number:  [id number], ICD Serial Number: [id number]   FEMORAL ARTERY - FEMORAL ARTERY BYPASS GRAFT  march 2009   left to right bypass, first @ [facility], second at [facility] by Dr Eliza   ICD GENERATOR CHANGEOUT N/A 11/12/2016   Procedure: ICD Generator Changeout;  Surgeon: Kelsie Agent, MD;  Location: [facility] INVASIVE CV LAB;  Service: Cardiovascular;  Laterality: N/A;   RIGHT HEART CATH N/A 03/18/2023   Procedure: RIGHT HEART CATH;  Surgeon: Rolan Ezra RAMAN, MD;  Location: [facility] INVASIVE CV LAB;  Service: Cardiovascular;  Laterality: N/A;   RIGHT/LEFT HEART CATH AND CORONARY ANGIOGRAPHY N/A 07/12/2016   Procedure: Right/Left Heart Cath and Coronary Angiography;  Surgeon: Ezra RAMAN Rolan, MD;  Location: [facility] INVASIVE CV LAB;  Service: Cardiovascular;  Laterality: N/A;   RIGHT/LEFT HEART CATH AND CORONARY ANGIOGRAPHY N/A 01/28/2020   Procedure: RIGHT/LEFT HEART CATH AND CORONARY ANGIOGRAPHY;  Surgeon: Rolan Ezra RAMAN, MD;  Location: [facility] INVASIVE CV LAB;  Service: Cardiovascular;  Laterality: N/A;   TONSILLECTOMY     TRANSESOPHAGEAL ECHOCARDIOGRAM (CATH LAB) N/A 01/07/2024   Procedure: TRANSESOPHAGEAL ECHOCARDIOGRAM;  Surgeon: Rolan Ezra RAMAN, MD;  Location: [facility] INVASIVE CV LAB;  Service: Cardiovascular;  Laterality: N/A;   VASECTOMY     VENTRICULAR ABLATION SURGERY  09/2008    Family History  Problem Relation Age of Onset   Gastric cancer Mother    Throat cancer Father    Peripheral vascular disease Father    Heart attack Brother 34    Social History   Socioeconomic History   Marital status: Single    Spouse name: Not on file   Number of children: 0   Years of education: 12   Highest education level: Some college, no degree  Occupational History   Not on file  Tobacco Use    Smoking status: Former    Current packs/day: 0.00    Average packs/day: 1 pack/day for 47.0 years (47.0 ttl pk-yrs)    Types: Cigarettes    Start date: 17    Quit date: 2009    Years since quitting: 16.6    Passive exposure: Never   Smokeless tobacco: Never   Tobacco comments:    Former smoker 10/02/23  Vaping Use   Vaping status: Never Used  Substance and Sexual Activity   Alcohol use: Yes    Comment: 07/15/2016 nothing for the last couple years 04/18/21 very seldom   Drug use: No    Comment: prior    Sexual activity: Not on file  Other Topics Concern   Not on file  Social History Narrative   Single; gets minimal exercise; 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 originally from [place]   Caffeine 3/4 c daily   Social Drivers of Dispensing optician  Resource Strain: Low Risk  (07/19/2021)   Overall Financial Resource Strain (CARDIA)    Difficulty of Paying Living Expenses: Not very hard  Food Insecurity: No Food Insecurity (07/19/2021)   Hunger Vital Sign    Worried About Running Out of Food in the Last Year: Never true    Ran Out of Food in the Last Year: Never true  Transportation Needs: No Transportation Needs (07/19/2021)   PRAPARE - Administrator, Civil Service (Medical): No    Lack of Transportation (Non-Medical): No  Physical Activity: Not on file  Stress: Not on file  Social Connections: Not on file  Intimate Partner Violence: Not on file     Prior to Admission medications   Medication Sig Start Date End Date Taking? Authorizing Provider  acetaminophen  (TYLENOL ) 500 MG tablet Take 1,000 mg by mouth every 6 (six) hours as needed for moderate pain (pain score 4-6).    [provider]  amiodarone  (PACERONE ) 200 MG tablet Take 0.5 tablets (100 mg total) by mouth daily. 11/17/23   Rolan Ezra RAMAN, MD  carvedilol  (COREG ) 12.5 MG tablet Take 1 tablet by mouth twice daily 10/31/23   Glena Harlene HERO, FNP  chlorhexidine  (PERIDEX ) 0.12 % solution Use as directed 15  mLs in the mouth or throat daily as needed (irritation).  02/02/15   [provider]  cholecalciferol (VITAMIN D3) 25 MCG (1000 UNIT) tablet Take 1,000 Units by mouth daily.    [provider]  clopidogrel  (PLAVIX ) 75 MG tablet Take 75 mg by mouth daily.    [provider]  dabigatran  (PRADAXA ) 75 MG CAPS capsule Take 1 capsule (75 mg total) by mouth 2 (two) times daily. 11/21/23   Rolan Ezra RAMAN, MD  dapagliflozin  propanediol (FARXIGA ) 10 MG TABS tablet Take 1 tablet (10 mg total) by mouth daily before breakfast. 06/23/23   Rolan Ezra RAMAN, MD  eplerenone  (INSPRA ) 25 MG tablet Take 1 tablet by mouth once daily 10/31/23   Glena Harlene HERO, FNP  furosemide  (LASIX ) 40 MG tablet Take 1 tablet (40 mg total) by mouth 2 (two) times daily. 12/04/23   Rolan Ezra RAMAN, MD  leflunomide  (ARAVA ) 20 MG tablet Take 20 mg by mouth daily.    [provider]  levothyroxine  (SYNTHROID ) 50 MCG tablet Take 1 tablet (50 mcg total) by mouth daily before breakfast. 11/21/23   Rolan Ezra RAMAN, MD  Melatonin 10 MG TABS Take 10 mg by mouth at bedtime as needed (sleep).     [provider]  mineral oil-hydrophilic petrolatum  (AQUAPHOR) ointment Apply 1 Application topically daily as needed (skin cancer/Change dressing).    [provider]  simvastatin  (ZOCOR ) 20 MG tablet TAKE 1 TABLET BY MOUTH ONCE DAILY AT BEDTIME 05/13/23   Rolan Ezra RAMAN, MD    Allergies  Allergen Reactions   Atorvastatin Other (See Comments)    Muscle pain   Crestor [Rosuvastatin Calcium ] Other (See Comments)    Muscle pain   Enalapril  Other (See Comments)    Upset stomach   Lisinopril Cough   Losartan  Other (See Comments)    Muscle pain    Telmisartan  Other (See Comments)    Stomach ache    REVIEW OF SYSTEMS:  General: no fevers/chills/night sweats Eyes: no blurry vision, diplopia, or amaurosis ENT: no sore throat or hearing loss Resp: no cough, wheezing, or hemoptysis CV: no edema  or palpitations GI: no abdominal pain, nausea, vomiting, diarrhea, or constipation GU: no dysuria, frequency, or hematuria Skin:  no rash Neuro: no headache, numbness, tingling, or weakness of extremities Musculoskeletal: no joint pain or swelling Heme: no bleeding, DVT, or easy bruising Endo: no polydipsia or polyuria  There were no vitals taken for this visit.  PHYSICAL EXAM: GEN:  AO x 3 in no acute distress HEENT: normal Dentition: Normal*** Neck: JVP normal. +2***carotid upstrokes without bruits. No thyromegaly. Lungs: equal expansion, clear bilaterally CV: Apex is discrete and nondisplaced, RRR without murmur or gallop*** Abd: soft, non-tender, non-distended; no bruit; positive bowel sounds Ext: no edema, ecchymoses, or cyanosis Vascular: 2+ femoral pulses, 2+ radial pulses       Skin: warm and dry without rash Neuro: CN II-XII grossly intact; motor and sensory grossly intact    DATA AND STUDIES:  EKG: 2025 AV pacing  EKG Interpretation Date/Time:    Ventricular Rate:    PR Interval:    QRS Duration:    QT Interval:    QTC Calculation:   R Axis:      Text Interpretation:          CARDIAC STUDIES: Refer to CV Procedures and Imaging Tabs  02/25/2023: B Natriuretic Peptide 1,104.4 10/31/2023: Hemoglobin 12.1; Platelets 187 11/17/2023: ALT 15; BUN 41; Creatinine, Ser 2.51; Potassium 4.2; Sodium 138; TSH 17.203   STS RISK CALCULATOR: ***  NHYA CLASS: ***     ASSESSMENT AND PLAN:   1. Nonrheumatic mitral valve regurgitation   2. Cardiomyopathy, ischemic   3. Coronary artery disease involving native coronary artery of native heart without angina pectoris   4. VT (ventricular tachycardia) (HCC)   5. Typical atrial flutter (HCC)   6. PAF (paroxysmal atrial fibrillation) (HCC)   7. Secondary hypercoagulable state (HCC)   8. Primary hypertension   9. Hyperlipidemia LDL goal <55   10. Aortic atherosclerosis (HCC)   11. CKD (chronic kidney disease) stage 4,  GFR 15-29 ml/min (HCC)   12. Pulmonary fibrosis (HCC)       I have personally reviewed the patients imaging data as summarized above.  I have reviewed the natural history of mitral regurgitation with the patient and family members who are present today. We have discussed the limitations of medical therapy and the poor prognosis associated with symptomatic mitral regurgitation. We have also reviewed potential treatment options, including palliative medical therapy, conventional mitral surgery, and transcatheter mitral edge-to-edge repair. We discussed treatment options in the context of this patient's specific comorbid medical conditions.   All of the patient's questions were answered today. Will make further recommendations based on the results of studies outlined above.   I spent *** minutes reviewing all clinical data during and prior to this visit including all relevant imaging studies, laboratories, clinical information from other health systems and prior notes from both Cardiology and other specialties, interviewing the patient, conducting a complete physical examination, and coordinating care in order to formulate a comprehensive and personalized evaluation and treatment plan.   Josephine Rudnick K Shontez Sermon, MD  01/12/2024 1:36 PM    [facility] [address] [place], [place], [place]  [address] Phone: [phone]; Fax: [phone]

## 2024-01-13 ENCOUNTER — Encounter (HOSPITAL_COMMUNITY): Payer: Self-pay | Admitting: Cardiology

## 2024-01-13 ENCOUNTER — Ambulatory Visit (HOSPITAL_COMMUNITY)
Admission: RE | Admit: 2024-01-13 | Discharge: 2024-01-13 | Disposition: A | Discharge status: Home / Self Care | Source: Ambulatory Visit | Attending: Cardiology | Admitting: Cardiology

## 2024-01-13 ENCOUNTER — Telehealth: Payer: Self-pay

## 2024-01-13 VITALS — BP 110/78 | HR 64 | Wt 137.8 lb

## 2024-01-13 DIAGNOSIS — R9431 Abnormal electrocardiogram [ECG] [EKG]: Secondary | ICD-10-CM | POA: Diagnosis not present

## 2024-01-13 DIAGNOSIS — R2681 Unsteadiness on feet: Secondary | ICD-10-CM | POA: Diagnosis not present

## 2024-01-13 DIAGNOSIS — Z955 Presence of coronary angioplasty implant and graft: Secondary | ICD-10-CM | POA: Insufficient documentation

## 2024-01-13 DIAGNOSIS — I340 Nonrheumatic mitral (valve) insufficiency: Secondary | ICD-10-CM | POA: Insufficient documentation

## 2024-01-13 DIAGNOSIS — Z8616 Personal history of COVID-19: Secondary | ICD-10-CM | POA: Insufficient documentation

## 2024-01-13 DIAGNOSIS — E785 Hyperlipidemia, unspecified: Secondary | ICD-10-CM | POA: Diagnosis not present

## 2024-01-13 DIAGNOSIS — J84112 Idiopathic pulmonary fibrosis: Secondary | ICD-10-CM | POA: Insufficient documentation

## 2024-01-13 DIAGNOSIS — I4819 Other persistent atrial fibrillation: Secondary | ICD-10-CM | POA: Insufficient documentation

## 2024-01-13 DIAGNOSIS — Z7901 Long term (current) use of anticoagulants: Secondary | ICD-10-CM | POA: Insufficient documentation

## 2024-01-13 DIAGNOSIS — Z8249 Family history of ischemic heart disease and other diseases of the circulatory system: Secondary | ICD-10-CM | POA: Diagnosis not present

## 2024-01-13 DIAGNOSIS — I2510 Atherosclerotic heart disease of native coronary artery without angina pectoris: Secondary | ICD-10-CM | POA: Insufficient documentation

## 2024-01-13 DIAGNOSIS — I130 Hypertensive heart and chronic kidney disease with heart failure and stage 1 through stage 4 chronic kidney disease, or unspecified chronic kidney disease: Secondary | ICD-10-CM | POA: Diagnosis not present

## 2024-01-13 DIAGNOSIS — I714 Abdominal aortic aneurysm, without rupture, unspecified: Secondary | ICD-10-CM | POA: Diagnosis not present

## 2024-01-13 DIAGNOSIS — I255 Ischemic cardiomyopathy: Secondary | ICD-10-CM | POA: Diagnosis not present

## 2024-01-13 DIAGNOSIS — I5022 Chronic systolic (congestive) heart failure: Secondary | ICD-10-CM | POA: Diagnosis present

## 2024-01-13 DIAGNOSIS — Z95810 Presence of automatic (implantable) cardiac defibrillator: Secondary | ICD-10-CM | POA: Diagnosis not present

## 2024-01-13 DIAGNOSIS — I4892 Unspecified atrial flutter: Secondary | ICD-10-CM | POA: Diagnosis not present

## 2024-01-13 DIAGNOSIS — E039 Hypothyroidism, unspecified: Secondary | ICD-10-CM | POA: Insufficient documentation

## 2024-01-13 DIAGNOSIS — Z79890 Hormone replacement therapy (postmenopausal): Secondary | ICD-10-CM | POA: Insufficient documentation

## 2024-01-13 DIAGNOSIS — Z79899 Other long term (current) drug therapy: Secondary | ICD-10-CM | POA: Diagnosis not present

## 2024-01-13 DIAGNOSIS — I739 Peripheral vascular disease, unspecified: Secondary | ICD-10-CM | POA: Insufficient documentation

## 2024-01-13 NOTE — Telephone Encounter (Signed)
 Nathan Pruitt:   Fossa looks good for transseptal approach. Adequate space within LA to achieve straddle and steer down to the valve plane. Patient creates challenging imaging windows but believe enhancing the image maybe possible through probe manipulations and angle sweeps. MR is central due to bilateral leaflet tethering. Posterior leaflet length in the long axis grasping views measure ~0.9cm enough for our larger clip option. Gradient measured at and valve area was not seen in the upload. Plan on placing first clip central and see how the MR responds. With the amount of regurgitant volume seen at 82ml, it could be reasonable to plan for a two-clip case with the first clip placed on the medial side of A2P2 and a second implant just lateral. XT/XTW appropriate.

## 2024-01-13 NOTE — Patient Instructions (Signed)
 There have been no changes to your medications.  Your physician recommends that you schedule a follow-up appointment in: 2 months.  If you have any questions or concerns before your next appointment please send us  a message through [facility] or call our office at [phone].    TO LEAVE A MESSAGE FOR THE NURSE SELECT OPTION 2, PLEASE LEAVE A MESSAGE INCLUDING: YOUR NAME DATE OF BIRTH CALL BACK NUMBER REASON FOR CALL**this is important as we prioritize the call backs  YOU WILL RECEIVE A CALL BACK THE SAME DAY AS LONG AS YOU CALL BEFORE 4:00 PM  At the [facility], you and your health needs are our priority. As part of our continuing mission to provide you with exceptional heart care, we have created designated Provider Care Teams. These Care Teams include your primary Cardiologist (physician) and Advanced Practice Providers (APPs- Physician Assistants and Nurse Practitioners) who all work together to provide you with the care you need, when you need it.   You may see any of the following providers on your designated Care Team at your next follow up: Dr Toribio Fuel Dr [name] Dr. Ria Commander Dr. Morene Brownie Amy Lenetta, NP Caffie Shed, [place] [facility] [place], [place] Beckey Coe, NP [place] Lee, NP Ellouise Class, NP Tinnie Redman, PharmD Jaun Bash, PharmD   Please be sure to bring in all your medications bottles to every appointment.    Thank you for choosing [facility]

## 2024-01-13 NOTE — Progress Notes (Signed)
 ID:  Nathan Pruitt, DOB Dec 23, 1945, MRN [id number]   Provider location: [facility] Type of Visit: Established patient   PCP:  Ina Marcellus RAMAN, MD  Cardiologist:  [name], MD  Chief complaint: Fatigue  History of Present Illness: Nathan Pruitt is a 78 y.o. male with a history of history of CAD, PAD, ischemic CMP, and VT s/p ICD.    Echo in 2/17 showed EF 40% and regional WMAs.   In 3/18, he developed episodes of VT as well as increased dyspnea.  LHC/RHC was done, showing new occlusion of a large ramus (CTO, probably a month or so old at time of cath) and old CTO RCA with collaterals.  He had DES x 3 to ramus.  Filling pressures were optimized on RHC.  Amiodarone  was increased to 200 mg daily from 100 mg daily.  No VT since PCI.    Echo in 6/19 showed EF 35-40% with wall motion abnormalities. Echo in 1/21 showed EF 40-45% with basal to mid inferolateral and inferior akinesis, mild MR, normal RV, PASP 35 mmHg.   LHC/RHC in 9/21 with normal filling pressures and cardiac output but 95% OM1 stenosis.  This was treated with DES to OM1.   He has been following with Dr. Theophilus with idiopathic pulmonary fibrosis.  I was concerned that he could have amiodarone  -related lung toxicity but Dr. Theophilus did not think this was very likely.  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     Labs drawn in 1/22 showed K up to 5.9 and creatinine 4.  He was sent to the ER at [facility].  He was found to have COVID-19 and was admitted. He had a productive cough and sore throat.  He was thought to be dehydrated and got IV fluid.  Creatinine 2.3 at discharge.  Lasix , valsartan , and eplerenone  were stopped.  Other meds were continued.  Echo in 6/22 showed EF 30-35%, basal-mid inferior and inferolateral akinesis, anterolateral hypokinesis, moderate MR (appears infarct-related), mildly decreased RV function.   In 6/23, he had upgrade of device to CRT-D  given frequent RV pacing.  Echo in 2/24 showed EF 25-30% with anterolateral/inferolateral akinesis, basal-mid inferior akinesis, mild RV dysfunction, mild-moderate MR.   CPX in 10/24 was a submaximal study but probably severe HF limitation as well as deconditioning.   RHC in 11/24 showed normal filling pressures and CI 2.11 by Fick, 2.35 by thermodilution.   In 2/25, patient had baroreceptor activation therapy placed.   Patient was noted to go into atrial fibrillation/flutter by device interrogation.  He was started on Eliquis . He had DCCV to NSR in 7/25.   Echo in 7/25 showed EF 20-25%, moderate LV dilation, mild RV enlargement and moderate-severe RV dysfunction, PASP 55 mmHg, moderate-severe MR (likely infarct related, worse than on prior echo), moderate AI, IVC dilated.   TEE was done in 9/25 to investigate mitral regurgitation; EF 25-30%, basal inferolateral/inferior aneurysm, moderate LV enlargement, mild LVH, moderate RV dysfunction, severe infarct-related MR.    He returns today for followup of CHF.  He is in NSR today (a-paced) but does not feel any better than at last appointment.  Still feels generalized fatigue.  Unsteady on his feet but no falls.  No lightheadedness with standing.  No chest pain.  No BRBPR/melena. Overall, does not feel any better but also no worse.  Weight is up 7 lbs on our scale, but he says it has been  stable at home.   ECG (personally reviewed): A-BiV pacing  Medtronic device interrogation: Stable thoracic impedance, no AF, 98% BiVpacing   Labs (9/23): hgb 11.2, K 4.7, creatinine 2.2, LFTs normal, transferrin saturation 21% Labs (1/24): K 4.5, creatinine 2.3, LFTs normal, TSH slightly elevated Labs (8/24): K 4.8, creatinine 2.3, LFTs normal, LDL 70 Labs (12/24): K 4.1, creatinine 1.9 Labs (6/25): K 4.3, creatinine 2.22, hgb 12.1 Labs (7/25): LDL 74, TSH 17, K 4.2, creatinine 2.51, LFTs normal Labs (8/25): K 4, creatinine 2.3 Labs (9/25): K 4.3, creatinine  2.4, LDL 50   Allergies (verified):  No Known Drug Allergies   Past Medical History: 1. Coronary artery disease. The patient reports history of silent MI in 1993.  This was likely an inferior MI (see below).  - The patient presented to [facility] in 5/10 with VT and mildly elevated cardiac enzymes. LHC (5/10):  Inferobasal dyskinesis with EF 35-40%.  There was chronic total occlusion of the mid RCA with good collaterals.  Luminals LCA.  This did not appear to be an acute cause of the 5/10 event.  - LHC (3/18): Known occlusion of RCA with collaterals, new occlusion of ramus.  DES x 3 to ramus.  - LHC (9/21): Known occlusion RCA with collaterals, 95% OMI treated with DES.  2. Hypertension.  3. Hyperlipidemia: Intolerant of higher doses of simvastatin , Crestor, Lipitor, pravastatin . Zetia  caused constipation.  4. Remote tobacco abuse with 47-pack-year history, quitting in August 2009.  5. Peripheral arterial disease.  - Status post left-to-right fem-fem bypass performed in [place] in March 2009.  - Status post redo left-to-right fem-fem bypass performed by Dr. Eliza at [facility] in 2009.  - ABIs normal 3/15, ABIs normal 3/16, ABIs normal 3/17, ABIs normal 4/18, ABIs normal 4/19, ABIs normal 12/20. ABIs stable 3/24.  6. Rheumatoid arthritis, on leflunomide .  7.  Ischemic cardiomyopathy:  EF 35-40% by LV-gram 5/10 with inferobasal dyskinesis.  Echo 5/10 showed EF 40% with mild LVH, no significant MR, inferobasal and posterobasal akinesis.  Echo (7/11): EF 50%, mild LVH, basal-mid inferoposterior akinesis.  Echo (7/12): EF 45-50% with basal anterolateral, basal posterior, and basal to mid inferior akinesis.  Echo (4/16): EF 40-45%, basal to mid inferolateral AK, basal inferior AK, basal to mid anterolateral HK.  Echo (2/17): EF 40% with basal to mid inferior akinesis, basal inferolateral aneurysm, mid inferolateral akinesis, basal anterolateral hypokinesis, normal RV size and systolic function, PASP  25 mmHg.  - Echo (3/18) with EF 30-35%, moderate LV dilation, moderate MR, mildly decreased RV systolic function, PASP 51 mmmHg. - RHC (3/18): mean RA 4, PA 38/12, mean PCWP 17, CI 2.2.  - Echo (6/19): EF 35-40%, inferior/inferolateral/anterolateral WMAs, moderate MR likely infarct-related, normal RV size with mildly decreased systolic function.  - Echo (1/21): EF 35-40% with wall motion abnormalities. Echo was done today and reviewed, EF 40-45% with basal to mid inferolateral and inferior akinesis, mild MR, normal RV, PASP 35 mmHg.  - RHC (9/21): mean RA 4, PA 33/12, mean PCWP 9, CI 3.12 - Echo (6/22): EF 30-35%, basal-mid inferior and inferolateral akinesis, anterolateral hypokinesis, moderate MR (appears infarct-related), mildly decreased RV function.  - CRT-D upgrade (Medtronic) in 6/23.  - Echo (2/24): EF 25-30% with anterolateral/inferolateral akinesis, basal-mid inferior akinesis, mild RV dysfunction, mild-moderate MR. - CPX (10/24): submaximal with RER 1.04, peak VO2 11.6, VE/VCO2 slope 54; severe HF limitation, also with deconditioning.  - RHC (11/24): mean RA 2, PA 31/7, mean PCWP 11, CI 2.11 Fick/2.35 thermodilution.  -  Baroreceptor activation therapy 2/25.  - Echo (7/25): EF 20-25%, moderate LV dilation, mild RV enlargement and moderate-severe RV dysfunction, PASP 55 mmHg, moderate-severe MR (likely infarct related, worse than on prior echo), moderate AI, IVC dilated.  - TEE (9/25): EF 25-30%, basal inferolateral/inferior aneurysm, moderate LV enlargement, mild LVH, moderate RV dysfunction, severe infarct-related MR.   8.  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  VT storm 5/10 suppressed by amiodarone  and Coreg .  He has a Medtronic CRT-D device.  9.  Atrial flutter:  Status post isthmus ablation 5/10.   10. Restrictive lung defect: PFTs (7/11): FVC 74%, FEV1 80%, ratio 75%, TLC 78%, DLCO 68%.  This suggests a mild restrictive defect and a mild obstructive defect.  He did have  response to bronchodilator. These PFTs were significantly better than the report from Dr. Mardee in [place] done prior.  He had last PFTs in 2/12 with no significant change.  - PFTs (9/21) with significantly decreased DLCO and concern for ILD/pulmonary fibrosis.  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  11.  Anxiety 12.  Chronic cough: No relief with change from ACEI to ARB or with trial of PPI.  13.  CKD: Stage 3.  14. Mitral regurgitation: Moderate on 6/22 echo, suspect infarct-related.  - Moderate-severe on 7/25 echo.  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  15. AAA: 3.1 cm AAA on abdominal US  12/20  - Abdominal US  5/22 with 3.1 cm AAA - Abdominal US  2/24 with 3.4 cm AAA 16. COVID-19 infection: 1/22.  17. Atrial fibrillation/flutter: Persistent.  - DCCV to NSR in 7/25 18. Fe deficiency anemia  Current Outpatient Medications  Medication Sig Dispense Refill   acetaminophen  (TYLENOL ) 500 MG tablet Take 1,000 mg by mouth every 6 (six) hours as needed for moderate pain (pain score 4-6).     amiodarone  (PACERONE ) 200 MG tablet Take 0.5 tablets (100 mg total) by mouth daily. 90 tablet 1   carvedilol  (COREG ) 12.5 MG tablet Take 1 tablet by mouth twice daily 180 tablet 3   chlorhexidine  (PERIDEX ) 0.12 % solution Use as directed 15 mLs in the mouth or throat daily as needed (irritation).   3   cholecalciferol (VITAMIN D3) 25 MCG (1000 UNIT) tablet Take 1,000 Units by mouth daily.     dabigatran  (PRADAXA ) 75 MG CAPS capsule Take 1 capsule (75 mg total) by mouth 2 (two) times daily. 180 capsule 3   dapagliflozin  propanediol (FARXIGA ) 10 MG TABS tablet Take 1 tablet (10 mg total) by mouth daily before breakfast. 90 tablet 3   eplerenone  (INSPRA ) 25 MG tablet Take 1 tablet by mouth once daily 90 tablet 0   furosemide  (LASIX ) 40 MG tablet Take 1 tablet (40 mg total) by mouth 2 (two) times daily. 180 tablet 3   leflunomide  (ARAVA ) 20 MG tablet Take 20 mg by mouth daily.      levothyroxine  (SYNTHROID ) 50 MCG tablet Take 1 tablet (50 mcg total) by mouth daily before breakfast. 90 tablet 3   Melatonin 10 MG TABS Take 10 mg by mouth at bedtime as needed (sleep).      simvastatin  (ZOCOR ) 20 MG tablet TAKE 1 TABLET BY MOUTH ONCE DAILY AT BEDTIME 90 tablet 3   clopidogrel  (PLAVIX ) 75 MG tablet Take 75 mg by mouth daily. (Patient not taking: Reported on 01/13/2024)     No current facility-administered medications for this encounter.    Allergies:   Atorvastatin, Crestor [rosuvastatin calcium ], Enalapril , Lisinopril, Losartan , and Telmisartan   Social History:  The patient  reports that he quit smoking about 16 years ago. His smoking use included cigarettes. He started smoking about 63 years ago. He has a 47 pack-year smoking history. He has never been exposed to tobacco smoke. He has never used smokeless tobacco. He reports current alcohol use. He reports that he does not use drugs.   Family History:  The patient's family history includes Gastric cancer in his mother; Heart attack (age of onset: 14) in his brother; Peripheral vascular disease in his father; Throat cancer in his father.   ROS:  Please see the history of present illness.   All other systems are personally reviewed and negative.   Exam:   BP 110/78   Pulse 64   Wt 62.5 kg (137 lb 12.8 oz)   SpO2 98%   BMI 19.77 kg/m  General: NAD Neck: No JVD, no thyromegaly or thyroid  nodule.  Lungs: Clear to auscultation bilaterally with normal respiratory effort. CV: Nondisplaced PMI.  Heart regular S1/S2, no S3/S4, 1/6 HSM apex.  No peripheral edema.  No carotid bruit.  Normal pedal pulses.  Abdomen: Soft, nontender, no hepatosplenomegaly, no distention.  Skin: Intact without lesions or rashes.  Neurologic: Alert and oriented x 3.  Psych: Normal affect. Extremities: No clubbing or cyanosis.  HEENT: Normal.   Wt Readings from Last 3 Encounters:  01/13/24 62.5 kg (137 lb 12.8 oz)  01/07/24 59 kg (130 lb)   12/23/23 59.9 kg (132 lb)     ASSESSMENT AND PLAN:  1. Chronic systolic CHF: Ischemic cardiomyopathy.  Medtronic CRT-D device, baroreceptor activation therapy device.  Echo in 6/22 with EF 30-35%, mild RV dysfunction. Upgraded to CRT device given high percentage of RV pacing.  Echo 2/24 showed EF 25-30% with anterolateral/inferolateral akinesis, basal-mid inferior akinesis, mild RV dysfunction, mild-moderate MR.  CPX in 10/24 was submaximal but suggestive of severe HF limitation.  RHC In 11/24 showed normal filling pressures with low, but not markedly low, CI (2.11 Fick, 2.35 thermo). Most recent echo in 7/25 showed EF 20-25%, moderate LV dilation, mild RV enlargement and moderate-severe RV dysfunction, PASP 55 mmHg, moderate-severe MR (likely infarct related, worse than on prior echo), moderate AI, IVC dilated.  Pulmonary fibrosis plays a role in his symptoms.  CKD limits medication titration. Ongoing NYHA class III symptoms, primarily fatigue.  Despite cardioversion back to NSR, he does not feel better.  Recent TEE confirmed severe infarct-related MR.  - Continue Coreg  12.5 mg bid.       - Continue Lasix  40 mg bid.   - Continue eplerenone  25 mg daily.  - He is off valsartan  for now with elevated creatinine, may be able to start back on low dose in the future.    - He would be a difficult LVAD candidate with CKD and his lung disease (pulmonary fibrosis).  He says that he would not be interested in open heart surgery and also does not have anyone who could stay with him as a caregiver post-op.  RHC in 10/24 did not show markedly low output though CPX showed a severe functional limitation. Symptoms have been slowly worse despite CRT and barostimulator.  See below, plan to refer for mTEER.  2. CAD: Denies chest pain.  Had DES x 3 to ramus in 3/18 and DES to OM1 in 9/21.   - Continue Zocor .  - He is now off Plavix  due to Eliquis  use.   3. Hyperlipidemia: Stable on simvastatin  without myalgias.  Has not  been  able to tolerate higher dose of simvastatin  or other statins. Unable to tolerate Zetia  due to constipation.  - Continue simvastatin  20 mg daily, most recent LDL in 8/25 was 50.  4. Hx of VT: Has been on amiodarone  100 mg daily. Has ICD.  He did not tolerate sotalol .  - LFTs normal in 7/25.  He is on Levoxyl  for amiodarone -induced mild hypothyroidism.   - Knows to get a yearly eye exam   - See below regarding ?amiodarone  lung toxicity.  5. PAD: He denies claudication.  Stable ABIs 2/24 at [facility]. 6. AAA: 3.4 cm in 2/24, followed by [facility].  7. CKD: Stage 3.  Last creatinine 2.4, stable.  - Follows with nephrology now.    8. Pulmonary: PFTs were done with increased dyspnea and amiodorone use, concerning for ILD/pulmonary fibrosis.  High resolution CT chest was concerning for ILD.  I have worried about amiodarone -induced lung toxicity. 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  He was offered antifibrotic treatment but wants to hold off for now. I think that some of his symptomatology is due to IPF.  - Can continue amiodarone  100 mg daily as above for VT suppression.  - Pulmonary followup with Dr. Theophilus.   9. Atrial fibrillation/flutter: He was in persistent atrial flutter, now s/p DCCV in 7/25.  He remains in NSR.   - Continue Eliquis  5 mg bid.  - Continue low dose amiodarone .  Will not increase with concern for amiodarone  lung toxicity.  10. Hypothyroidism: TSH higher.  - Continue Levoxyl .   11. Mitral regurgitation: Suspect infarct-related MR.  Mitral regurgitation was at least moderate-severe on 7/25 echo.  MR looks worse than in the past and may contribute to worsened symptoms. TEE in 9/25 was confirmatory of severe infarct-related MR.  - He has appointment with structural heart service to assess for mTEER.   Followup 2 months.   I spent 31 minutes reviewing records, interviewing/examining patient, and managing orders.   Signed, [name], MD  01/13/2024   [facility] [address] [facility] [place] [address] [phone] (office) [phone] (fax)

## 2024-01-15 ENCOUNTER — Encounter: Payer: Self-pay | Admitting: Internal Medicine

## 2024-01-15 ENCOUNTER — Ambulatory Visit: Attending: Internal Medicine | Admitting: Internal Medicine

## 2024-01-15 VITALS — BP 106/52 | HR 68 | Ht 70.0 in | Wt 133.0 lb

## 2024-01-15 DIAGNOSIS — I255 Ischemic cardiomyopathy: Secondary | ICD-10-CM

## 2024-01-15 DIAGNOSIS — I472 Ventricular tachycardia, unspecified: Secondary | ICD-10-CM

## 2024-01-15 DIAGNOSIS — I10 Essential (primary) hypertension: Secondary | ICD-10-CM

## 2024-01-15 DIAGNOSIS — E785 Hyperlipidemia, unspecified: Secondary | ICD-10-CM

## 2024-01-15 DIAGNOSIS — I2510 Atherosclerotic heart disease of native coronary artery without angina pectoris: Secondary | ICD-10-CM

## 2024-01-15 DIAGNOSIS — D6869 Other thrombophilia: Secondary | ICD-10-CM

## 2024-01-15 DIAGNOSIS — I483 Typical atrial flutter: Secondary | ICD-10-CM

## 2024-01-15 DIAGNOSIS — I480 Paroxysmal atrial fibrillation: Secondary | ICD-10-CM

## 2024-01-15 DIAGNOSIS — I340 Nonrheumatic mitral (valve) insufficiency: Secondary | ICD-10-CM | POA: Diagnosis not present

## 2024-01-15 DIAGNOSIS — I739 Peripheral vascular disease, unspecified: Secondary | ICD-10-CM

## 2024-01-15 DIAGNOSIS — N184 Chronic kidney disease, stage 4 (severe): Secondary | ICD-10-CM

## 2024-01-15 DIAGNOSIS — I700 Atherosclerosis of aorta: Secondary | ICD-10-CM

## 2024-01-15 DIAGNOSIS — J8410 Pulmonary fibrosis, unspecified: Secondary | ICD-10-CM

## 2024-01-15 NOTE — Patient Instructions (Signed)
 Medication Instructions:  No medication changes were made at this visit. Continue current regimen.   *If you need a refill on your cardiac medications before your next appointment, please call your pharmacy*  Lab Work: None ordered today. If you have labs (blood work) drawn today and your tests are completely normal, you will receive your results only by: MyChart Message (if you have MyChart) OR A paper copy in the mail If you have any lab test that is abnormal or we need to change your treatment, we will call you to review the results.  Testing/Procedures: None ordered today.  Follow-Up: At [facility], you and your health needs are our priority.  As part of our continuing mission to provide you with exceptional heart care, our providers are all part of one team.  This team includes your primary Cardiologist (physician) and Advanced Practice Providers or APPs (Physician Assistants and Nurse Practitioners) who all work together to provide you with the care you need, when you need it.  Your next appointment:   Per structural heart team  Provider:   Lurena Red, MD or Izetta Hummer, PA-C

## 2024-01-17 NOTE — Progress Notes (Signed)
Remote ICD Transmission.

## 2024-01-19 ENCOUNTER — Other Ambulatory Visit: Payer: Self-pay

## 2024-01-19 DIAGNOSIS — I5022 Chronic systolic (congestive) heart failure: Secondary | ICD-10-CM

## 2024-01-19 DIAGNOSIS — I340 Nonrheumatic mitral (valve) insufficiency: Secondary | ICD-10-CM

## 2024-01-26 ENCOUNTER — Other Ambulatory Visit: Payer: Self-pay

## 2024-01-26 DIAGNOSIS — I340 Nonrheumatic mitral (valve) insufficiency: Secondary | ICD-10-CM

## 2024-01-26 DIAGNOSIS — I5022 Chronic systolic (congestive) heart failure: Secondary | ICD-10-CM

## 2024-01-26 NOTE — Telephone Encounter (Signed)
 Spoke with the patient at length. He understood that he will have to be scheduled first case if he is the only patient ready for surgery that day.  After much discussion, it was agreed instructions will be sent to him via MyChart and he will be called if his procedure time may be postponed.  He will get pre-procedure labs drawn on 01/30/2024 when he already has plans to go to [facility] for testing. He repeated several times he does not like [facility] and was offered to get his labs drawn at a different location closer to home, but he will go to the [facility] location prior to leaving the office after his [facility] visits.

## 2024-01-30 ENCOUNTER — Other Ambulatory Visit: Payer: Self-pay | Admitting: Internal Medicine

## 2024-01-30 ENCOUNTER — Encounter (HOSPITAL_COMMUNITY): Payer: Self-pay

## 2024-01-30 ENCOUNTER — Ambulatory Visit (HOSPITAL_COMMUNITY)
Admission: RE | Admit: 2024-01-30 | Discharge: 2024-01-30 | Disposition: A | Discharge status: Home / Self Care | Source: Ambulatory Visit | Attending: Vascular Surgery | Admitting: Vascular Surgery

## 2024-01-30 ENCOUNTER — Ambulatory Visit (INDEPENDENT_AMBULATORY_CARE_PROVIDER_SITE_OTHER): Admitting: Physician Assistant

## 2024-01-30 VITALS — BP 126/66 | HR 69 | Temp 98.1°F | Resp 18 | Ht 70.0 in | Wt 126.5 lb

## 2024-01-30 DIAGNOSIS — I340 Nonrheumatic mitral (valve) insufficiency: Secondary | ICD-10-CM

## 2024-01-30 DIAGNOSIS — I739 Peripheral vascular disease, unspecified: Secondary | ICD-10-CM | POA: Insufficient documentation

## 2024-01-30 DIAGNOSIS — I779 Disorder of arteries and arterioles, unspecified: Secondary | ICD-10-CM

## 2024-01-30 DIAGNOSIS — I714 Abdominal aortic aneurysm, without rupture, unspecified: Secondary | ICD-10-CM | POA: Insufficient documentation

## 2024-01-30 LAB — VAS US ABI WITH/WO TBI
Left ABI: 1.22
Right ABI: 1.29

## 2024-01-30 NOTE — Progress Notes (Signed)
 Office Note     CC:  follow up Requesting Provider:  Ina Marcellus RAMAN, MD  HPI: Nathan Pruitt is a 78 y.o. (October 18, 1945) male who presents for surveillance of PAD.  He has a history of redo left to right femoral to femoral bypass in 2009 by Dr. Melvenia.  He denies any claudication, rest pain, or tissue loss of bilateral lower extremities.  He has chronic right plantar foot pain which he manages with lidocaine  patches occasionally.  He denies any new or changing abdominal or back pain.  He is a former smoker.  He is on a daily statin.  He is on Pradaxa  for atrial fibrillation.  Surgical history also a includes right Barostim implant by Dr. Serene in February of this year due to CHF.   Past Medical History:  Diagnosis Date   Abnormal PFTs 11/2009   FVC 74%, FEV1 80%, ratio 75%, TLC 78%, DLCO 68%. this suggests a mild restrictive and obstructive defect. pt did have a response to bronchodilator. these PFTs were significantly better than the report from Dr. Mardee in [place] done prior.    AICD (automatic cardioverter/defibrillator) present 2010   Anxiety    Atrial flutter (HCC)    s/p isthmus ablation 5/10   CAD (coronary artery disease)    hx of silent MI in 1993. likely an inferior MI. hx of 2D cardiogram in 3/09 showing EF of 40%. hx of myoview in [place] 3/09-nml. presented to [facility] 5/10 with VT and mildly elevated cardiac enzymes LHC (5/10): inferobasal dyskinesis with EF 35-40%. was chronic total occlusion of mid RCA with good collaterals. luminals LCA. this does not appear to be an acute cause of the 5/10 event   Cancer (HCC)    skin   Cervical radiculopathy    CHF (congestive heart failure) (HCC)    Chronic lower back pain    CKD (chronic kidney disease)    HLD (hyperlipidemia)    HTN (hypertension)    Ischemic cardiomyopathy    EF 35-40% by LV-gram 5/10 with inferobasal dyskinesis. echo 5/10 showed EF 40% w/mild LVH, no sig. MR, inferobasal and posterobasal akinesis. echo (7/11): EF 50%,  mild LVH, basal-mid inferoposterior akenesis   Migraine    visual migraine; maybe 12/year (07/16/2016)   PAD (peripheral artery disease)    s/p L-to-R fem-fem bypass performed by Dr. Eliza at [facility] in 2009    Rheumatoid arthritis [facility])    on leflunomide    Silent myocardial infarction [facility]) 1993   silent   Tobacco abuse    47 pack year hx; quit 8/09   Ventricular tachycardia (HCC)    likely scar-mediated. VT storm 5/10 suppressed by amiodarone  and Coreg . He has duel chamber Medtronic ICD    Past Surgical History:  Procedure Laterality Date   BIV UPGRADE N/A 10/05/2021   Procedure: DOLPH LLANO;  Surgeon: Inocencio Soyla Lunger, MD;  Location: [facility] INVASIVE CV LAB;  Service: Cardiovascular;  Laterality: N/A;   CARDIAC DEFIBRILLATOR PLACEMENT  09/22/2008   CARDIOVERSION N/A 11/25/2023   Procedure: CARDIOVERSION;  Surgeon: Rolan Ezra RAMAN, MD;  Location: [facility] INVASIVE CV LAB;  Service: Cardiovascular;  Laterality: N/A;   CATARACT EXTRACTION W/ INTRAOCULAR LENS  IMPLANT, BILATERAL Bilateral 05/2016 - 06/2016   CORONARY BALLOON ANGIOPLASTY N/A 01/28/2020   Procedure: CORONARY BALLOON ANGIOPLASTY;  Surgeon: Dann Candyce RAMAN, MD;  Location: [facility] INVASIVE CV LAB;  Service: Cardiovascular;  Laterality: N/A;   CORONARY CTO INTERVENTION  07/16/2016   CORONARY CTO INTERVENTION N/A 07/16/2016  Procedure: Coronary CTO Intervention;  Surgeon: Victory LELON Sharps, MD;  Location: [facility] INVASIVE CV LAB;  Service: Cardiovascular;  Laterality: N/A;   EP IMPLANTABLE DEVICE  09/22/08   Medtronic, ICD Model Number:  [id number], ICD Serial Number: [id number]   FEMORAL ARTERY - FEMORAL ARTERY BYPASS GRAFT  march 2009   left to right bypass, first @ [facility], second at [facility] by Dr Eliza   ICD GENERATOR CHANGEOUT N/A 11/12/2016   Procedure: ICD Generator Changeout;  Surgeon: Kelsie Agent, MD;  Location: [facility] INVASIVE CV LAB;  Service: Cardiovascular;  Laterality: N/A;   RIGHT HEART CATH N/A  03/18/2023   Procedure: RIGHT HEART CATH;  Surgeon: Rolan Ezra RAMAN, MD;  Location: [facility] INVASIVE CV LAB;  Service: Cardiovascular;  Laterality: N/A;   RIGHT/LEFT HEART CATH AND CORONARY ANGIOGRAPHY N/A 07/12/2016   Procedure: Right/Left Heart Cath and Coronary Angiography;  Surgeon: Ezra RAMAN Rolan, MD;  Location: [facility] INVASIVE CV LAB;  Service: Cardiovascular;  Laterality: N/A;   RIGHT/LEFT HEART CATH AND CORONARY ANGIOGRAPHY N/A 01/28/2020   Procedure: RIGHT/LEFT HEART CATH AND CORONARY ANGIOGRAPHY;  Surgeon: Rolan Ezra RAMAN, MD;  Location: [facility] INVASIVE CV LAB;  Service: Cardiovascular;  Laterality: N/A;   TONSILLECTOMY     TRANSESOPHAGEAL ECHOCARDIOGRAM (CATH LAB) N/A 01/07/2024   Procedure: TRANSESOPHAGEAL ECHOCARDIOGRAM;  Surgeon: Rolan Ezra RAMAN, MD;  Location: [facility] INVASIVE CV LAB;  Service: Cardiovascular;  Laterality: N/A;   VASECTOMY     VENTRICULAR ABLATION SURGERY  09/2008    Social History   Socioeconomic History   Marital status: Single    Spouse name: Not on file   Number of children: 0   Years of education: 12   Highest education level: Some college, no degree  Occupational History   Not on file  Tobacco Use   Smoking status: Former    Current packs/day: 0.00    Average packs/day: 1 pack/day for 47.0 years (47.0 ttl pk-yrs)    Types: Cigarettes    Start date: 40    Quit date: 2009    Years since quitting: 16.7    Passive exposure: Never   Smokeless tobacco: Never   Tobacco comments:    Former smoker 10/02/23  Vaping Use   Vaping status: Never Used  Substance and Sexual Activity   Alcohol use: Yes    Comment: 07/15/2016 nothing for the last couple years 04/18/21 very seldom   Drug use: No    Comment: prior    Sexual activity: Not on file  Other Topics Concern   Not on file  Social History Narrative   Single; gets minimal exercise; 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 originally from [place]   Caffeine 3/4 c daily   Social Drivers of Corporate investment banker Strain:  Low Risk  (07/19/2021)   Overall Financial Resource Strain (CARDIA)    Difficulty of Paying Living Expenses: Not very hard  Food Insecurity: No Food Insecurity (07/19/2021)   Hunger Vital Sign    Worried About Running Out of Food in the Last Year: Never true    Ran Out of Food in the Last Year: Never true  Transportation Needs: No Transportation Needs (07/19/2021)   PRAPARE - Administrator, Civil Service (Medical): No    Lack of Transportation (Non-Medical): No  Physical Activity: Not on file  Stress: Not on file  Social Connections: Not on file  Intimate Partner Violence: Not on file    Family History  Problem Relation Age of Onset  Gastric cancer Mother    Throat cancer Father    Peripheral vascular disease Father    Heart attack Brother 29    Current Outpatient Medications  Medication Sig Dispense Refill   acetaminophen  (TYLENOL ) 500 MG tablet Take 1,000 mg by mouth every 6 (six) hours as needed for moderate pain (pain score 4-6).     amiodarone  (PACERONE ) 200 MG tablet Take 0.5 tablets (100 mg total) by mouth daily. 90 tablet 1   carvedilol  (COREG ) 12.5 MG tablet Take 1 tablet by mouth twice daily 180 tablet 3   chlorhexidine  (PERIDEX ) 0.12 % solution Use as directed 15 mLs in the mouth or throat daily as needed (irritation).   3   cholecalciferol (VITAMIN D3) 25 MCG (1000 UNIT) tablet Take 1,000 Units by mouth daily.     dabigatran  (PRADAXA ) 75 MG CAPS capsule Take 1 capsule (75 mg total) by mouth 2 (two) times daily. 180 capsule 3   dapagliflozin  propanediol (FARXIGA ) 10 MG TABS tablet Take 1 tablet (10 mg total) by mouth daily before breakfast. 90 tablet 3   eplerenone  (INSPRA ) 25 MG tablet Take 1 tablet by mouth once daily 90 tablet 0   furosemide  (LASIX ) 40 MG tablet Take 1 tablet (40 mg total) by mouth 2 (two) times daily. 180 tablet 3   leflunomide  (ARAVA ) 20 MG tablet Take 20 mg by mouth daily.     levothyroxine  (SYNTHROID ) 50 MCG tablet Take 1 tablet (50  mcg total) by mouth daily before breakfast. 90 tablet 3   Melatonin 10 MG TABS Take 10 mg by mouth at bedtime as needed (sleep).      simvastatin  (ZOCOR ) 20 MG tablet TAKE 1 TABLET BY MOUTH ONCE DAILY AT BEDTIME 90 tablet 3   No current facility-administered medications for this visit.    Allergies  Allergen Reactions   Atorvastatin Other (See Comments)    Muscle pain   Crestor [Rosuvastatin Calcium ] Other (See Comments)    Muscle pain   Enalapril  Other (See Comments)    Upset stomach   Lisinopril Cough   Losartan  Other (See Comments)    Muscle pain    Telmisartan  Other (See Comments)    Stomach ache     REVIEW OF SYSTEMS:  Negative unless noted in HPI [X]  denotes positive finding, [ ]  denotes negative finding Cardiac  Comments:  Chest pain or chest pressure:    Shortness of breath upon exertion:    Short of breath when lying flat:    Irregular heart rhythm:        Vascular    Pain in calf, thigh, or hip brought on by ambulation:    Pain in feet at night that wakes you up from your sleep:     Blood clot in your veins:    Leg swelling:         Pulmonary    Oxygen at home:    Productive cough:     Wheezing:         Neurologic    Sudden weakness in arms or legs:     Sudden numbness in arms or legs:     Sudden onset of difficulty speaking or slurred speech:    Temporary loss of vision in one eye:     Problems with dizziness:         Gastrointestinal    Blood in stool:     Vomited blood:         Genitourinary    Burning when urinating:  Blood in urine:        Psychiatric    Major depression:         Hematologic    Bleeding problems:    Problems with blood clotting too easily:        Skin    Rashes or ulcers:        Constitutional    Fever or chills:      PHYSICAL EXAMINATION:  Vitals:   01/30/24 1024  BP: 126/66  Pulse: 69  Resp: 18  Temp: 98.1 F (36.7 C)  TempSrc: Temporal  SpO2: 99%  Weight: 126 lb 8 oz (57.4 kg)  Height: 5' 10  (1.778 m)    General:  WDWN in NAD; vital signs documented above Gait: Not observed HENT: WNL, normocephalic Pulmonary: normal non-labored breathing Cardiac: regular HR Abdomen: soft, NT, no masses Skin: without rashes Vascular Exam/Pulses: Palpable left DP and right PT Extremities: without ischemic changes, without Gangrene , without cellulitis; without open wounds;  Musculoskeletal: no muscle wasting or atrophy  Neurologic: A&O X 3 Psychiatric:  The pt has Normal affect.   Non-Invasive Vascular Imaging:   Widely patent femorofemoral bypass with multiphasic flow throughout  ABI/TBIToday's ABIToday's TBIPrevious ABIPrevious TBI  +-------+-----------+-----------+------------+------------+  Right 1.29       0.82       1.09        0.73          +-------+-----------+-----------+------------+------------+  Left  1.22       0.84       0.95        1.05          +-------+-----------+-----------+------------+------------+     ASSESSMENT/PLAN:: 78 y.o. male here for follow up for surveillance of PAD with history of redo femorofemoral bypass  Bilateral lower extremities are well-perfused with palpable pedal pulses.  Duplex demonstrates a widely patent femorofemoral bypass and stable ABIs and TBI's.  We will repeat bypass surveillance and ABIs in 1 year.  We will also recheck his AAA at that time which measured 3.4 cm last year.  He knows to notify the office with any questions or concerns in the meantime.   Donnice Sender, PA-C [facility] [phone]  Clinic MD:   Gretta on call

## 2024-01-31 LAB — CBC WITH DIFFERENTIAL/PLATELET
Basophils Absolute: 0.1 x10E3/uL (ref 0.0–0.2)
Basos: 1 %
EOS (ABSOLUTE): 0.1 x10E3/uL (ref 0.0–0.4)
Eos: 2 %
Hematocrit: 35.2 % — ABNORMAL LOW (ref 37.5–51.0)
Hemoglobin: 11.6 g/dL — ABNORMAL LOW (ref 13.0–17.7)
Immature Grans (Abs): 0 x10E3/uL (ref 0.0–0.1)
Immature Granulocytes: 0 %
Lymphocytes Absolute: 0.7 x10E3/uL (ref 0.7–3.1)
Lymphs: 9 %
MCH: 32.9 pg (ref 26.6–33.0)
MCHC: 33 g/dL (ref 31.5–35.7)
MCV: 100 fL — ABNORMAL HIGH (ref 79–97)
Monocytes Absolute: 0.6 x10E3/uL (ref 0.1–0.9)
Monocytes: 9 %
Neutrophils Absolute: 5.8 x10E3/uL (ref 1.4–7.0)
Neutrophils: 79 %
Platelets: 253 x10E3/uL (ref 150–450)
RBC: 3.53 x10E6/uL — ABNORMAL LOW (ref 4.14–5.80)
RDW: 13 % (ref 11.6–15.4)
WBC: 7.4 x10E3/uL (ref 3.4–10.8)

## 2024-01-31 LAB — COMPREHENSIVE METABOLIC PANEL WITH GFR
ALT: 7 IU/L (ref 0–44)
AST: 10 IU/L (ref 0–40)
Albumin: 4.1 g/dL (ref 3.8–4.8)
Alkaline Phosphatase: 81 IU/L (ref 47–123)
BUN/Creatinine Ratio: 16 (ref 10–24)
BUN: 35 mg/dL — ABNORMAL HIGH (ref 8–27)
Bilirubin Total: 0.3 mg/dL (ref 0.0–1.2)
CO2: 23 mmol/L (ref 20–29)
Calcium: 9 mg/dL (ref 8.6–10.2)
Chloride: 97 mmol/L (ref 96–106)
Creatinine, Ser: 2.16 mg/dL — ABNORMAL HIGH (ref 0.76–1.27)
Globulin, Total: 2.2 g/dL (ref 1.5–4.5)
Glucose: 108 mg/dL — ABNORMAL HIGH (ref 70–99)
Potassium: 4.7 mmol/L (ref 3.5–5.2)
Sodium: 136 mmol/L (ref 134–144)
Total Protein: 6.3 g/dL (ref 6.0–8.5)
eGFR: 30 mL/min/1.73 — ABNORMAL LOW (ref >59)

## 2024-01-31 LAB — BRAIN NATRIURETIC PEPTIDE: BNP: 1241.9 pg/mL — ABNORMAL HIGH (ref 0.0–100.0)

## 2024-01-31 LAB — PROTIME-INR
INR: 1.2 (ref 0.9–1.2)
Prothrombin Time: 13 s — ABNORMAL HIGH (ref 9.1–12.0)

## 2024-02-06 ENCOUNTER — Telehealth: Payer: Self-pay

## 2024-02-06 NOTE — Telephone Encounter (Signed)
 Confirmed procedure date of 02/12/2024. Confirmed arrival time of 0630 for procedure time at 0830. Reviewed pre-procedure instructions with patient. Confirmed he took Pradaxa  today and will STOP now. Confirmed he will take Farxiga  tomorrow then STOP.  The patient understands to call if questions/concerns arise prior to procedure. He was grateful for call and agreed with plan.

## 2024-02-06 NOTE — Progress Notes (Signed)
 STS scoring:  MVr: Operative Mortality 8.44% Morbidity & Mortality 20.2% Stroke 1.67% Renal Failure 6.34% Reoperation 5.8% Prolonged Ventilation 10.3% Deep Sternal Wound Infection 0.076% Long Hospital Stay (>14 days) 17.6% Short Hospital Stay (<6 days)* 18.3%   MVR: Operative Mortality 11.2% Morbidity & Mortality 29.1% Stroke 2.07% Renal Failure 9.11% Reoperation 6.3% Prolonged Ventilation 15.5% Deep Sternal Wound Infection 0.115% Long Hospital Stay (>14 days) 18.7% Short Hospital Stay (<6 days)* 12.5%

## 2024-02-09 ENCOUNTER — Encounter: Payer: Self-pay | Admitting: Cardiology

## 2024-02-09 NOTE — Progress Notes (Signed)
 PERIOPERATIVE PRESCRIPTION FOR IMPLANTED CARDIAC DEVICE PROGRAMMING  Patient Information: Name:  Nathan Pruitt  DOB:  11/10/1945  MRN:  [id number]  Planned Procedure:  Transesophageal echocardiogram and mitral transcatheter edge to edge  Surgeon:  Dr. Wendel  Date of Procedure:  02-14-2024  Cautery will be used.  Position during surgery:  supine   Please send documentation back to:  [facility] (Fax # [phone])   Device Information:  Clinic EP Physician:  Soyla Norton, MD   Device Type:  Defibrillator Manufacturer and Phone #:  Medtronic: [id number] Pacemaker Dependent?:  Yes.   Date of Last Device Check:  01/13/24   Normal Device Function?:  Yes.    Electrophysiologist's Recommendations:  Have magnet available. Provide continuous ECG monitoring when magnet is used or reprogramming is to be performed.  Procedure may interfere with device function.  Magnet should be placed over device during procedure.  Per Device Clinic Standing Orders, Prentice JINNY Silvan, RN  5:12 PM 02/09/2024

## 2024-02-09 NOTE — Progress Notes (Signed)
 PERIOPERATIVE PRESCRIPTION FOR IMPLANTED CARDIAC DEVICE PROGRAMMING  Patient Information: Name:  Nathan Pruitt  DOB:  1946/02/09  MRN:  [id number]  Planned Procedure:  Transesophageal echocardiogram and mitral transcatheter edge to edge  Surgeon:  Dr. Wendel  Date of Procedure:  02/20/24  Cautery will be used.  Position during surgery:  supine   Device Information:  Clinic EP Physician:  Soyla Norton, MD   Device Type:  Pacemaker and Defibrillator Manufacturer and Phone #:  Medtronic: [id number] Pacemaker Dependent?:  Yes.   Date of Last Device Check:  12/23/2023 Normal Device Function?:  Yes.    Electrophysiologist's Recommendations:  Have magnet available. Provide continuous ECG monitoring when magnet is used or reprogramming is to be performed.  Procedure will likely interfere with device function.  Device should be programmed:  Tachy therapies disabled and Asynchronous pacing during procedure and returned to normal programming after procedure  Per Device Clinic Standing Orders, Delon DELENA Sharps, RN  5:01 PM 02/09/2024

## 2024-02-11 ENCOUNTER — Inpatient Hospital Stay (HOSPITAL_COMMUNITY): Admitting: Registered Nurse

## 2024-02-11 ENCOUNTER — Telehealth (HOSPITAL_COMMUNITY): Payer: Self-pay

## 2024-02-11 ENCOUNTER — Encounter (HOSPITAL_COMMUNITY): Payer: Self-pay | Admitting: Internal Medicine

## 2024-02-11 ENCOUNTER — Inpatient Hospital Stay (HOSPITAL_COMMUNITY)

## 2024-02-11 ENCOUNTER — Encounter (HOSPITAL_COMMUNITY): Admission: RE | Disposition: E | Payer: Self-pay | Source: Home / Self Care | Attending: Internal Medicine

## 2024-02-11 ENCOUNTER — Encounter (HOSPITAL_COMMUNITY): Admitting: Registered Nurse

## 2024-02-11 ENCOUNTER — Inpatient Hospital Stay (HOSPITAL_COMMUNITY)
Admission: RE | Admit: 2024-02-11 | Discharge: 2024-03-06 | DRG: 266 | Disposition: E | Attending: Internal Medicine | Admitting: Internal Medicine

## 2024-02-11 DIAGNOSIS — J84112 Idiopathic pulmonary fibrosis: Secondary | ICD-10-CM | POA: Diagnosis present

## 2024-02-11 DIAGNOSIS — I252 Old myocardial infarction: Secondary | ICD-10-CM

## 2024-02-11 DIAGNOSIS — Z79890 Hormone replacement therapy (postmenopausal): Secondary | ICD-10-CM

## 2024-02-11 DIAGNOSIS — I714 Abdominal aortic aneurysm, without rupture, unspecified: Secondary | ICD-10-CM | POA: Diagnosis present

## 2024-02-11 DIAGNOSIS — I10 Essential (primary) hypertension: Secondary | ICD-10-CM

## 2024-02-11 DIAGNOSIS — J439 Emphysema, unspecified: Secondary | ICD-10-CM

## 2024-02-11 DIAGNOSIS — D631 Anemia in chronic kidney disease: Secondary | ICD-10-CM

## 2024-02-11 DIAGNOSIS — E785 Hyperlipidemia, unspecified: Secondary | ICD-10-CM | POA: Diagnosis present

## 2024-02-11 DIAGNOSIS — Z7902 Long term (current) use of antithrombotics/antiplatelets: Secondary | ICD-10-CM

## 2024-02-11 DIAGNOSIS — I739 Peripheral vascular disease, unspecified: Secondary | ICD-10-CM | POA: Diagnosis present

## 2024-02-11 DIAGNOSIS — R570 Cardiogenic shock: Secondary | ICD-10-CM

## 2024-02-11 DIAGNOSIS — Z7984 Long term (current) use of oral hypoglycemic drugs: Secondary | ICD-10-CM

## 2024-02-11 DIAGNOSIS — Z8249 Family history of ischemic heart disease and other diseases of the circulatory system: Secondary | ICD-10-CM

## 2024-02-11 DIAGNOSIS — R6881 Early satiety: Secondary | ICD-10-CM | POA: Diagnosis present

## 2024-02-11 DIAGNOSIS — Z955 Presence of coronary angioplasty implant and graft: Secondary | ICD-10-CM

## 2024-02-11 DIAGNOSIS — F419 Anxiety disorder, unspecified: Secondary | ICD-10-CM | POA: Diagnosis present

## 2024-02-11 DIAGNOSIS — M0510 Rheumatoid lung disease with rheumatoid arthritis of unspecified site: Secondary | ICD-10-CM | POA: Diagnosis present

## 2024-02-11 DIAGNOSIS — F4310 Post-traumatic stress disorder, unspecified: Secondary | ICD-10-CM | POA: Diagnosis present

## 2024-02-11 DIAGNOSIS — Z888 Allergy status to other drugs, medicaments and biological substances status: Secondary | ICD-10-CM

## 2024-02-11 DIAGNOSIS — Z87891 Personal history of nicotine dependence: Secondary | ICD-10-CM

## 2024-02-11 DIAGNOSIS — I130 Hypertensive heart and chronic kidney disease with heart failure and stage 1 through stage 4 chronic kidney disease, or unspecified chronic kidney disease: Secondary | ICD-10-CM | POA: Diagnosis present

## 2024-02-11 DIAGNOSIS — I340 Nonrheumatic mitral (valve) insufficiency: Secondary | ICD-10-CM | POA: Diagnosis present

## 2024-02-11 DIAGNOSIS — Z006 Encounter for examination for normal comparison and control in clinical research program: Secondary | ICD-10-CM

## 2024-02-11 DIAGNOSIS — I2582 Chronic total occlusion of coronary artery: Secondary | ICD-10-CM | POA: Diagnosis present

## 2024-02-11 DIAGNOSIS — J9601 Acute respiratory failure with hypoxia: Secondary | ICD-10-CM | POA: Diagnosis present

## 2024-02-11 DIAGNOSIS — E039 Hypothyroidism, unspecified: Secondary | ICD-10-CM | POA: Diagnosis present

## 2024-02-11 DIAGNOSIS — N184 Chronic kidney disease, stage 4 (severe): Secondary | ICD-10-CM

## 2024-02-11 DIAGNOSIS — Z66 Do not resuscitate: Secondary | ICD-10-CM | POA: Diagnosis present

## 2024-02-11 DIAGNOSIS — D6869 Other thrombophilia: Secondary | ICD-10-CM | POA: Diagnosis present

## 2024-02-11 DIAGNOSIS — G8929 Other chronic pain: Secondary | ICD-10-CM | POA: Diagnosis present

## 2024-02-11 DIAGNOSIS — Z79899 Other long term (current) drug therapy: Secondary | ICD-10-CM

## 2024-02-11 DIAGNOSIS — I483 Typical atrial flutter: Secondary | ICD-10-CM | POA: Diagnosis present

## 2024-02-11 DIAGNOSIS — I2510 Atherosclerotic heart disease of native coronary artery without angina pectoris: Secondary | ICD-10-CM | POA: Diagnosis present

## 2024-02-11 DIAGNOSIS — I255 Ischemic cardiomyopathy: Secondary | ICD-10-CM | POA: Diagnosis present

## 2024-02-11 DIAGNOSIS — Z95810 Presence of automatic (implantable) cardiac defibrillator: Secondary | ICD-10-CM

## 2024-02-11 DIAGNOSIS — Z9889 Other specified postprocedural states: Secondary | ICD-10-CM

## 2024-02-11 DIAGNOSIS — Z681 Body mass index (BMI) 19 or less, adult: Secondary | ICD-10-CM | POA: Diagnosis not present

## 2024-02-11 DIAGNOSIS — I472 Ventricular tachycardia, unspecified: Secondary | ICD-10-CM | POA: Diagnosis present

## 2024-02-11 DIAGNOSIS — Z7901 Long term (current) use of anticoagulants: Secondary | ICD-10-CM

## 2024-02-11 DIAGNOSIS — R0602 Shortness of breath: Secondary | ICD-10-CM | POA: Diagnosis present

## 2024-02-11 DIAGNOSIS — R64 Cachexia: Secondary | ICD-10-CM | POA: Diagnosis present

## 2024-02-11 DIAGNOSIS — Z515 Encounter for palliative care: Secondary | ICD-10-CM

## 2024-02-11 DIAGNOSIS — I480 Paroxysmal atrial fibrillation: Secondary | ICD-10-CM | POA: Diagnosis present

## 2024-02-11 DIAGNOSIS — I5022 Chronic systolic (congestive) heart failure: Secondary | ICD-10-CM | POA: Diagnosis present

## 2024-02-11 DIAGNOSIS — I5023 Acute on chronic systolic (congestive) heart failure: Secondary | ICD-10-CM | POA: Diagnosis present

## 2024-02-11 DIAGNOSIS — R531 Weakness: Secondary | ICD-10-CM | POA: Diagnosis present

## 2024-02-11 DIAGNOSIS — M545 Low back pain, unspecified: Secondary | ICD-10-CM | POA: Diagnosis present

## 2024-02-11 DIAGNOSIS — I700 Atherosclerosis of aorta: Secondary | ICD-10-CM | POA: Diagnosis present

## 2024-02-11 HISTORY — PX: RIGHT HEART CATH: CATH118263

## 2024-02-11 HISTORY — PX: TRANSCATHETER MITRAL EDGE TO EDGE REPAIR: CATH118311

## 2024-02-11 HISTORY — PX: TRANSESOPHAGEAL ECHOCARDIOGRAM (CATH LAB): EP1270

## 2024-02-11 LAB — POCT I-STAT 7, (LYTES, BLD GAS, ICA,H+H)
Acid-base deficit: 8 mmol/L — ABNORMAL HIGH (ref 0.0–2.0)
Bicarbonate: 16.8 mmol/L — ABNORMAL LOW (ref 20.0–28.0)
Calcium, Ion: 1.05 mmol/L — ABNORMAL LOW (ref 1.15–1.40)
HCT: 31 % — ABNORMAL LOW (ref 39.0–52.0)
Hemoglobin: 10.5 g/dL — ABNORMAL LOW (ref 13.0–17.0)
O2 Saturation: 100 %
Patient temperature: 37
Potassium: 5 mmol/L (ref 3.5–5.1)
Sodium: 134 mmol/L — ABNORMAL LOW (ref 135–145)
TCO2: 18 mmol/L — ABNORMAL LOW (ref 22–32)
pCO2 arterial: 32.9 mmHg (ref 32–48)
pH, Arterial: 7.317 — ABNORMAL LOW (ref 7.35–7.45)
pO2, Arterial: 295 mmHg — ABNORMAL HIGH (ref 83–108)

## 2024-02-11 LAB — POCT ACTIVATED CLOTTING TIME
Activated Clotting Time: 250 s
Activated Clotting Time: 291 s
Activated Clotting Time: 285 s
Activated Clotting Time: 262 s

## 2024-02-11 LAB — COOXEMETRY PANEL
Carboxyhemoglobin: 0.7 % (ref 0.5–1.5)
Methemoglobin: <0.7 % (ref 0.0–1.5)
O2 Saturation: 88 %
Total hemoglobin: 10.7 g/dL — ABNORMAL LOW (ref 12.0–16.0)

## 2024-02-11 LAB — SURGICAL PCR SCREEN
MRSA, PCR: NEGATIVE
Staphylococcus aureus: NEGATIVE

## 2024-02-11 LAB — ECHO TEE
MV M vel: 4.84 m/s
MV Peak grad: 93.7 mmHg
Radius: 1.1 cm

## 2024-02-11 LAB — TYPE AND SCREEN
ABO/RH(D): B POS
Antibody Screen: NEGATIVE

## 2024-02-11 LAB — CG4 I-STAT (LACTIC ACID): Lactic Acid, Venous: 0.9 mmol/L (ref 0.5–1.9)

## 2024-02-11 SURGERY — MITRAL VALVE REPAIR
Anesthesia: General

## 2024-02-11 MED ORDER — GLYCOPYRROLATE 0.2 MG/ML IJ SOLN: 0.2000 mg | INTRAMUSCULAR | Status: DC | PRN

## 2024-02-11 MED ORDER — ORAL CARE MOUTH RINSE: 15.0000 mL | OROMUCOSAL | Status: DC | PRN

## 2024-02-11 MED ORDER — ACETAMINOPHEN 325 MG PO TABS
650.0000 mg | ORAL_TABLET | Freq: Four times a day (QID) | ORAL | Status: DC | PRN
Start: 2024-02-11 — End: 2024-02-11

## 2024-02-11 MED ORDER — LEVOTHYROXINE SODIUM 50 MCG PO TABS
50.0000 ug | ORAL_TABLET | Freq: Every day | ORAL | Status: DC
Start: 2024-02-12 — End: 2024-02-11

## 2024-02-11 MED ORDER — CHLORHEXIDINE GLUCONATE 4 % EX SOLN: 60.0000 mL | Freq: Once | CUTANEOUS | Status: DC

## 2024-02-11 MED ORDER — PROPOFOL 10 MG/ML IV BOLUS
INTRAVENOUS | Status: DC | PRN
  Administered 2024-02-11: 50 mg via INTRAVENOUS
  Administered 2024-02-11: 50 ug/kg/min via INTRAVENOUS

## 2024-02-11 MED ORDER — MIDAZOLAM-SODIUM CHLORIDE 100-0.9 MG/100ML-% IV SOLN
INTRAVENOUS | Status: AC
  Administered 2024-02-11: 2 mg/h via INTRAVENOUS
  Filled 2024-02-11: qty 100

## 2024-02-11 MED ORDER — EPHEDRINE SULFATE-NACL 50-0.9 MG/10ML-% IV SOSY
PREFILLED_SYRINGE | INTRAVENOUS | Status: DC | PRN
  Administered 2024-02-11 (×2): 5 mg via INTRAVENOUS
  Administered 2024-02-11: 2.5 mg via INTRAVENOUS

## 2024-02-11 MED ORDER — ORAL CARE MOUTH RINSE: 15.0000 mL | OROMUCOSAL | Status: DC

## 2024-02-11 MED ORDER — HEPARIN (PORCINE) IN NACL 2000-0.9 UNIT/L-% IV SOLN
INTRAVENOUS | Status: DC | PRN
  Administered 2024-02-11 (×2): 1000 mL

## 2024-02-11 MED ORDER — FAMOTIDINE 20 MG PO TABS: 20.0000 mg | ORAL_TABLET | Freq: Two times a day (BID) | ORAL | Status: DC

## 2024-02-11 MED ORDER — PROPOFOL 1000 MG/100ML IV EMUL
0.0000 ug/kg/min | INTRAVENOUS | Status: DC
  Administered 2024-02-11: 30 ug/kg/min via INTRAVENOUS
  Filled 2024-02-11: qty 100

## 2024-02-11 MED ORDER — MIDAZOLAM BOLUS VIA INFUSION: 4.0000 mg | Freq: Once | INTRAVENOUS | Status: DC

## 2024-02-11 MED ORDER — PHENYLEPHRINE HCL-NACL 20-0.9 MG/250ML-% IV SOLN
INTRAVENOUS | Status: DC | PRN
  Administered 2024-02-11: 50 ug/min via INTRAVENOUS

## 2024-02-11 MED ORDER — CEFAZOLIN SODIUM-DEXTROSE 2-4 GM/100ML-% IV SOLN
2.0000 g | INTRAVENOUS | Status: AC
  Administered 2024-02-11: 2 g via INTRAVENOUS
  Filled 2024-02-11: qty 100

## 2024-02-11 MED ORDER — SODIUM CHLORIDE 0.9 % IV SOLN: 250.0000 mL | INTRAVENOUS | Status: DC | PRN

## 2024-02-11 MED ORDER — LABETALOL HCL 5 MG/ML IV SOLN: 10.0000 mg | INTRAVENOUS | Status: DC | PRN

## 2024-02-11 MED ORDER — ACETAMINOPHEN 325 MG PO TABS: 650.0000 mg | ORAL_TABLET | ORAL | Status: DC | PRN

## 2024-02-11 MED ORDER — POLYVINYL ALCOHOL 1.4 % OP SOLN: 1.0000 [drp] | Freq: Four times a day (QID) | OPHTHALMIC | Status: DC | PRN

## 2024-02-11 MED ORDER — NOREPINEPHRINE 4 MG/250ML-% IV SOLN: 0.0000 ug/min | INTRAVENOUS | Status: DC

## 2024-02-11 MED ORDER — OXYCODONE HCL 5 MG PO TABS: 5.0000 mg | ORAL_TABLET | Freq: Once | ORAL | Status: DC | PRN

## 2024-02-11 MED ORDER — SODIUM CHLORIDE 0.9% FLUSH: 3.0000 mL | INTRAVENOUS | Status: DC | PRN

## 2024-02-11 MED ORDER — OXYCODONE HCL 5 MG/5ML PO SOLN: 5.0000 mg | Freq: Once | ORAL | Status: DC | PRN

## 2024-02-11 MED ORDER — FENTANYL BOLUS VIA INFUSION
25.0000 ug | INTRAVENOUS | Status: DC | PRN
  Administered 2024-02-11 (×3): 50 ug via INTRAVENOUS

## 2024-02-11 MED ORDER — FENTANYL CITRATE (PF) 100 MCG/2ML IJ SOLN
INTRAMUSCULAR | Status: AC
  Filled 2024-02-11: qty 2

## 2024-02-11 MED ORDER — CHLORHEXIDINE GLUCONATE 0.12 % MT SOLN
15.0000 mL | Freq: Once | OROMUCOSAL | Status: AC
  Administered 2024-02-11: 15 mL via OROMUCOSAL
  Filled 2024-02-11: qty 15

## 2024-02-11 MED ORDER — GLYCOPYRROLATE 1 MG PO TABS: 1.0000 mg | ORAL_TABLET | ORAL | Status: DC | PRN

## 2024-02-11 MED ORDER — HEPARIN (PORCINE) IN NACL 1000-0.9 UT/500ML-% IV SOLN
INTRAVENOUS | Status: DC | PRN
  Administered 2024-02-11: 500 mL

## 2024-02-11 MED ORDER — DEXAMETHASONE SODIUM PHOSPHATE 10 MG/ML IJ SOLN
INTRAMUSCULAR | Status: DC | PRN
  Administered 2024-02-11: 5 mg via INTRAVENOUS

## 2024-02-11 MED ORDER — REVEFENACIN 175 MCG/3ML IN SOLN
175.0000 ug | Freq: Every day | RESPIRATORY_TRACT | Status: DC
  Administered 2024-02-11: 175 ug via RESPIRATORY_TRACT
  Filled 2024-02-11: qty 3

## 2024-02-11 MED ORDER — FENTANYL 2500MCG IN NS 250ML (10MCG/ML) PREMIX INFUSION
0.0000 ug/h | INTRAVENOUS | Status: DC
  Administered 2024-02-11: 50 ug/h via INTRAVENOUS
  Filled 2024-02-11: qty 250

## 2024-02-11 MED ORDER — POLYETHYLENE GLYCOL 3350 17 G PO PACK: 17.0000 g | PACK | Freq: Every day | ORAL | Status: DC

## 2024-02-11 MED ORDER — HEPARIN SODIUM (PORCINE) 1000 UNIT/ML IJ SOLN
INTRAMUSCULAR | Status: DC | PRN
  Administered 2024-02-11: 3000 [IU] via INTRAVENOUS
  Administered 2024-02-11: 5000 [IU] via INTRAVENOUS
  Administered 2024-02-11: 3000 [IU] via INTRAVENOUS

## 2024-02-11 MED ORDER — MIDAZOLAM-SODIUM CHLORIDE 100-0.9 MG/100ML-% IV SOLN: 0.5000 mg/h | INTRAVENOUS | Status: DC

## 2024-02-11 MED ORDER — FENTANYL CITRATE (PF) 100 MCG/2ML IJ SOLN: 25.0000 ug | INTRAMUSCULAR | Status: DC | PRN

## 2024-02-11 MED ORDER — HYDRALAZINE HCL 20 MG/ML IJ SOLN: 5.0000 mg | INTRAMUSCULAR | Status: DC | PRN

## 2024-02-11 MED ORDER — PANTOPRAZOLE SODIUM 40 MG IV SOLR: 40.0000 mg | Freq: Every day | INTRAVENOUS | Status: DC

## 2024-02-11 MED ORDER — PROPOFOL 1000 MG/100ML IV EMUL: 0.0000 ug/kg/min | INTRAVENOUS | Status: DC

## 2024-02-11 MED ORDER — ACETAMINOPHEN 500 MG PO TABS: 1000.0000 mg | ORAL_TABLET | Freq: Once | ORAL | Status: DC

## 2024-02-11 MED ORDER — FENTANYL CITRATE PF 50 MCG/ML IJ SOSY: 25.0000 ug | PREFILLED_SYRINGE | Freq: Once | INTRAMUSCULAR | Status: DC

## 2024-02-11 MED ORDER — NOREPINEPHRINE 4 MG/250ML-% IV SOLN
INTRAVENOUS | Status: DC | PRN
Start: 2024-02-11 — End: 2024-02-11
  Administered 2024-02-11: 2 ug/min via INTRAVENOUS

## 2024-02-11 MED ORDER — SODIUM CHLORIDE 0.9 % IV SOLN
INTRAVENOUS | Status: DC
Start: 2024-02-11 — End: 2024-02-11

## 2024-02-11 MED ORDER — ARFORMOTEROL TARTRATE 15 MCG/2ML IN NEBU: 15.0000 ug | INHALATION_SOLUTION | Freq: Two times a day (BID) | RESPIRATORY_TRACT | Status: DC

## 2024-02-11 MED ORDER — ACETAMINOPHEN 650 MG RE SUPP: 650.0000 mg | Freq: Four times a day (QID) | RECTAL | Status: DC | PRN

## 2024-02-11 MED ORDER — SODIUM CHLORIDE 0.9 % IV SOLN: INTRAVENOUS | Status: DC

## 2024-02-11 MED ORDER — AMIODARONE HCL 200 MG PO TABS: 100.0000 mg | ORAL_TABLET | Freq: Every day | ORAL | Status: DC

## 2024-02-11 MED ORDER — ROCURONIUM BROMIDE 10 MG/ML (PF) SYRINGE
PREFILLED_SYRINGE | INTRAVENOUS | Status: DC | PRN
  Administered 2024-02-11: 50 mg via INTRAVENOUS
  Administered 2024-02-11 (×3): 10 mg via INTRAVENOUS

## 2024-02-11 MED ORDER — PHENYLEPHRINE 80 MCG/ML (10ML) SYRINGE FOR IV PUSH (FOR BLOOD PRESSURE SUPPORT)
PREFILLED_SYRINGE | INTRAVENOUS | Status: DC | PRN
  Administered 2024-02-11: 80 ug via INTRAVENOUS

## 2024-02-11 MED ORDER — DOCUSATE SODIUM 50 MG/5ML PO LIQD: 100.0000 mg | Freq: Two times a day (BID) | ORAL | Status: DC

## 2024-02-11 MED ORDER — HEPARIN (PORCINE) 25000 UT/250ML-% IV SOLN: 600.0000 [IU]/h | INTRAVENOUS | Status: DC

## 2024-02-11 MED ORDER — HEPARIN SODIUM (PORCINE) 5000 UNIT/ML IJ SOLN: 5000.0000 [IU] | Freq: Three times a day (TID) | INTRAMUSCULAR | Status: DC

## 2024-02-11 MED ORDER — HEPARIN SODIUM (PORCINE) 1000 UNIT/ML IJ SOLN
INTRAMUSCULAR | Status: AC
  Filled 2024-02-11: qty 20

## 2024-02-11 MED ORDER — MIDAZOLAM HCL 2 MG/2ML IJ SOLN
INTRAMUSCULAR | Status: AC
  Administered 2024-02-11: 4 mg
  Filled 2024-02-11: qty 4

## 2024-02-11 MED ORDER — ONDANSETRON HCL 4 MG/2ML IJ SOLN: 4.0000 mg | Freq: Once | INTRAMUSCULAR | Status: DC | PRN

## 2024-02-11 MED ORDER — SODIUM CHLORIDE 0.9 % IV SOLN: INTRAVENOUS | Status: DC | PRN

## 2024-02-11 MED ORDER — CLEVIDIPINE BUTYRATE 0.5 MG/ML IV EMUL
INTRAVENOUS | Status: DC | PRN
  Administered 2024-02-11: 1 mg/h via INTRAVENOUS

## 2024-02-11 MED ORDER — LIDOCAINE 2% (20 MG/ML) 5 ML SYRINGE
INTRAMUSCULAR | Status: DC | PRN
  Administered 2024-02-11: 40 mg via INTRAVENOUS

## 2024-02-11 MED ORDER — MIDAZOLAM-SODIUM CHLORIDE 100-0.9 MG/100ML-% IV SOLN: 0.0000 mg/h | INTRAVENOUS | Status: DC

## 2024-02-11 MED ORDER — ONDANSETRON HCL 4 MG/2ML IJ SOLN
INTRAMUSCULAR | Status: DC | PRN
  Administered 2024-02-11: 4 mg via INTRAVENOUS

## 2024-02-11 MED ORDER — SODIUM CHLORIDE 0.9% FLUSH: 3.0000 mL | Freq: Two times a day (BID) | INTRAVENOUS | Status: DC

## 2024-02-11 MED ORDER — ONDANSETRON HCL 4 MG/2ML IJ SOLN: 4.0000 mg | Freq: Four times a day (QID) | INTRAMUSCULAR | Status: DC | PRN

## 2024-02-11 MED ORDER — MIDAZOLAM BOLUS VIA INFUSION
4.0000 mg | Freq: Once | INTRAVENOUS | Status: DC
  Filled 2024-02-11: qty 4

## 2024-02-11 MED ORDER — FENTANYL CITRATE (PF) 250 MCG/5ML IJ SOLN
INTRAMUSCULAR | Status: DC | PRN
  Administered 2024-02-11 (×2): 25 ug via INTRAVENOUS

## 2024-02-11 SURGICAL SUPPLY — 20 items
CATH MITRA STEERABLE GUIDE (CATHETERS) IMPLANT
CATH SWAN GANZ 7F STRAIGHT (CATHETERS) IMPLANT
CLIP MITRA G4 DELIVERY SYS NTW (Clip) IMPLANT
CLOSURE PERCLOSE PROSTYLE (Vascular Products) IMPLANT
ELECT DEFIB PAD ADLT CADENCE (PAD) IMPLANT
KIT HEART LEFT (KITS) ×2 IMPLANT
KIT VERSACROSS RF TRANS 67 (KITS) IMPLANT
PACK CARDIAC CATHETERIZATION (CUSTOM PROCEDURE TRAY) ×1 IMPLANT
SHEATH DILAT COONS TAPER 22F (SHEATH) IMPLANT
SHEATH DRYSEAL FLEX 26FR 33CM (SHEATH) IMPLANT
SHEATH PINNACLE 7F 10CM (SHEATH) IMPLANT
SHEATH PINNACLE 8F 10CM (SHEATH) IMPLANT
SHEATH PROBE COVER 6X72 (BAG) ×1 IMPLANT
SHIELD CATH-GARD CONTAMINATION (MISCELLANEOUS) IMPLANT
STOPCOCK MORSE 400PSI 3WAY (MISCELLANEOUS) ×6 IMPLANT
SYSTEM MITRACLIP G4 (SYSTAGENIX WOUND MANAGEMENT) IMPLANT
TRANSDUCER W/STOPCOCK (MISCELLANEOUS) ×1 IMPLANT
TUBING ART PRESS 72 MALE/FEM (TUBING) ×1 IMPLANT
WIRE EMERALD 3MM-J .035X150CM (WIRE) IMPLANT
WIRE MICRO SET SILHO 5FR 7 (SHEATH) IMPLANT

## 2024-02-12 ENCOUNTER — Encounter (HOSPITAL_COMMUNITY): Payer: Self-pay | Admitting: Internal Medicine

## 2024-02-17 ENCOUNTER — Encounter (HOSPITAL_COMMUNITY): Payer: Self-pay | Admitting: Internal Medicine

## 2024-02-20 ENCOUNTER — Ambulatory Visit: Admitting: Internal Medicine

## 2024-03-06 NOTE — Anesthesia Preprocedure Evaluation (Addendum)
 Anesthesia Evaluation  Patient identified by MRN, date of birth, ID band Patient awake    Reviewed: Allergy & Precautions, NPO status , Patient's Chart, lab work & pertinent test results, reviewed documented beta blocker date and time   History of Anesthesia Complications Negative for: history of anesthetic complications  Airway Mallampati: II  TM Distance: >3 FB Neck ROM: Full    Dental  (+) Dental Advisory Given, Implants   Pulmonary former smoker   Pulmonary exam normal        Cardiovascular hypertension, Pt. on home beta blockers and Pt. on medications + CAD, + Past MI, + Peripheral Vascular Disease and +CHF  + dysrhythmias Ventricular Tachycardia + Cardiac Defibrillator + Valvular Problems/Murmurs MR and AI  Rhythm:Regular Rate:Normal + Systolic murmurs  '25 TEE - EF 25 to 30%. 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 There was severe hypokinesis of the  anterolateral wall. The left ventricular internal cavity size was moderately dilated. There is mild concentric left ventricular hypertrophy. Right ventricular systolic function is moderately reduced. The right ventricular size is mildly enlarged. There is an ICD in the RV. Left atrial size was mildly dilated. Right atrial size was mildly dilated. Aortic valve regurgitation is mild to moderate. Suspect infarct-related mitral regurgitation with lateral/inferior wall motion abnormalities. Visually, mitral regurgitation looked moderate-severe. However, by PISA ERO 0.44 cm^2 and repeated in another view at 0.51 cm^2 as well as by 3-D vena contracta area 0.52 cm^2, MR is severe. There is systolic flattening but not flow reversal in the pulmonary vein doppler pattern.      Neuro/Psych  Headaches PSYCHIATRIC DISORDERS Anxiety      Neuromuscular disease    GI/Hepatic negative GI ROS, Neg liver ROS,,,  Endo/Other  negative  endocrine ROS    Renal/GU CRFRenal disease     Musculoskeletal  (+) Arthritis ,    Abdominal   Peds  Hematology  (+) Blood dyscrasia, anemia  On pradaxa     Anesthesia Other Findings   Reproductive/Obstetrics                              Anesthesia Physical Anesthesia Plan  ASA: 4  Anesthesia Plan: General   Post-op Pain Management: Tylenol  PO (pre-op)*   Induction: Intravenous  PONV Risk Score and Plan: 2 and Treatment may vary due to age or medical condition, Ondansetron  and Dexamethasone   Airway Management Planned: Oral ETT  Additional Equipment: Arterial line  Intra-op Plan:   Post-operative Plan: Extubation in OR  Informed Consent: I have reviewed the patients History and Physical, chart, labs and discussed the procedure including the risks, benefits and alternatives for the proposed anesthesia with the patient or authorized representative who has indicated his/her understanding and acceptance.     Dental advisory given  Plan Discussed with: CRNA and Anesthesiologist  Anesthesia Plan Comments:          Anesthesia Quick Evaluation

## 2024-03-06 NOTE — Consult Note (Signed)
 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 He has had significant fatigue weakness limiting his activities, early satiety and reduced PO intake. During the procedure he had successful placement of 2 clips but had a torn cord when the third clip was deployed. He had severe worsening of his MR due to this and ongoing pressor requirements. He was transferred to the ICU post operatively on MV and norepinephrine. He had worsening hypotension requiring neosynephrine boluses in transport to the ICU.   Pertinent  Medical History  HFrEF 2/2 ICM CAD Atrial flutter HTN HLD AICD Barostim PAD s/p fem-fem bypass RA Cachexia  Significant Hospital Events: Including procedures, antibiotic start and stop dates in addition to other pertinent events   02-14-2024 mitral clip, transferred to ICU after on vent   Interim History / Subjective:    Objective    Blood pressure 109/64, pulse 60, temperature (!) 97.5 F (36.4 C), resp. rate 18, height 5' 10 (1.778 m), weight 56.7 kg, SpO2 99%.        Intake/Output Summary (Last 24 hours) at 2024-02-14 1224 Last data filed at February 14, 2024 0845 Gross per 24 hour  Intake 100 ml  Output --  Net 100 ml   Filed Weights   2024-02-14 0641  Weight: 56.7 kg    Examination: General: critically ill appearing man lying in bed in NAD HENT: [place]/AT, eyes anicteric Lungs: breathing comfortably on MV, faint bilateral rhales. No wheezing.  Cardiovascular: S1S2, RRR. Systolic murmur across precordium radiating from the apex Abdomen: soft, NT Extremities: no edema, minimal muscle mass. Fem-fem graft in place Neuro: RASS  -5 GU: foley  CXR 2 view personally reviewed> hyperinflated, AICD and barostim.   7.32/33/295/17 No labs today  PFTs 2023: mild restriction, no obstruction, severely reduced DLCO  Resolved problem list   Assessment and Plan   Acute on chronic HFrEF due to worsening MR Severe MR, s/p mitral clip -norepi to maintain MAP >65 -coox -check LA -hold PTA farxiga , coreg , pradaxa   H/o VT -con't PTA amiodarone   Acute respiratory failure with hypoxia requiring MV, acute pulmonary edema due to severe MR Baseline restrictive lung disease; has emphysema on CT but no obstruction> may be artifact from relatively worse restrictive disease. 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  -CXR to confirm line & tube placements -LTVV; vent pressures appropriate. Adjustments made after ABG.  -VAP prevention protocol -PAD protocol for sedation -daily SAT & SBT as appropriate -had previously turned down anti-fibrotic meds for his RA-ILD  Chronic anemia -repeat CBC today -transfuse for Hb < 7 or hemodynamically significant bleeding  CKD 4 -strict I/O -renally dose meds, avoid nephrotoxic meds -maintain adequate perfusion  H/o RA   -hold PTA leflunomide   Hypothyroidism -synthroid   Severe PAD, s/p previous fem-fem bypass in 2009 AAA -hold PTA statin for now -maintain adequate perfusion  Cachexia -can start TF tomorrow once more stable from cardiac standpoint  Niece Nathan Pruitt updated at bedside. Poor prognosis with advanced diease and no options for mechanical support with chronic renal and pulmonary disease. His niece wants to ensure he is not uncomfortable.  We will try to optimize him medically, but that may not be enough. More family is coming to visit today.  Labs   CBC: No results for input(s): WBC, NEUTROABS, HGB, HCT, MCV, PLT in the last 168 hours.  Basic Metabolic Panel: No results for input(s): NA, K, CL, CO2,  GLUCOSE, BUN, CREATININE, CALCIUM , MG, PHOS in the last 168 hours. GFR: Estimated Creatinine Clearance: 22.2 mL/min (A) (by C-G formula based on SCr of 2.16 mg/dL (H)). No results for input(s): PROCALCITON, WBC, LATICACIDVEN in the last 168 hours.  Liver Function Tests: No results for input(s): AST, ALT, ALKPHOS, BILITOT, PROT, ALBUMIN in the last 168 hours. No results for input(s): LIPASE, AMYLASE in the last 168 hours. No results for input(s): AMMONIA in the last 168 hours.  ABG    Component Value Date/Time   HCO3 21.7 03/18/2023 1001   TCO2 23 03/18/2023 1001   ACIDBASEDEF 3.0 (H) 03/18/2023 1001   O2SAT 56 03/18/2023 1001     Coagulation Profile: No results for input(s): INR, PROTIME in the last 168 hours.  Cardiac Enzymes: No results for input(s): CKTOTAL, CKMB, CKMBINDEX, TROPONINI in the last 168 hours.  HbA1C: Hgb A1c MFr Bld  Date/Time Value Ref Range Status  09/01/2014 11:58 AM 6.0 4.6 - 6.5 % Final    Comment:    Glycemic Control Guidelines for People with Diabetes:Non Diabetic:  <6%Goal of Therapy: <7%Additional Action Suggested:  >8%   01/21/2014 09:31 AM 6.2 4.6 - 6.5 % Final    Comment:    Glycemic Control Guidelines for People with Diabetes:Non Diabetic:  <6%Goal of Therapy: <7%Additional Action Suggested:  >8%     CBG: No results for input(s): GLUCAP in the last 168 hours.  Review of Systems:   Unable to be obtained due to mental status.   Past Medical History:  He,  has a past medical history of Abnormal PFTs (11/2009), AICD (automatic cardioverter/defibrillator) present (2010), Anxiety, Atrial flutter (HCC), CAD (coronary artery disease), Cancer (HCC), Cervical radiculopathy, CHF (congestive heart failure) (HCC), Chronic lower back pain, CKD (chronic kidney disease), HLD (hyperlipidemia), HTN (hypertension), Ischemic cardiomyopathy, Migraine, PAD (peripheral artery disease), Rheumatoid arthritis (HCC),  Silent myocardial infarction (HCC) (1993), Tobacco abuse, and Ventricular tachycardia (HCC).   Surgical History:   Past Surgical History:  Procedure Laterality Date   BIV UPGRADE N/A 10/05/2021   Procedure: DOLPH LLANO;  Surgeon: Inocencio Soyla Lunger, MD;  Location: [facility] INVASIVE CV LAB;  Service: Cardiovascular;  Laterality: N/A;   CARDIAC DEFIBRILLATOR PLACEMENT  09/22/2008   CARDIOVERSION N/A 11/25/2023   Procedure: CARDIOVERSION;  Surgeon: Rolan Ezra RAMAN, MD;  Location: [facility] INVASIVE CV LAB;  Service: Cardiovascular;  Laterality: N/A;   CATARACT EXTRACTION W/ INTRAOCULAR LENS  IMPLANT, BILATERAL Bilateral 05/2016 - 06/2016   CORONARY BALLOON ANGIOPLASTY N/A 01/28/2020   Procedure: CORONARY BALLOON ANGIOPLASTY;  Surgeon: Dann Candyce RAMAN, MD;  Location: [facility] INVASIVE CV LAB;  Service: Cardiovascular;  Laterality: N/A;   CORONARY CTO INTERVENTION  07/16/2016   CORONARY CTO INTERVENTION N/A 07/16/2016   Procedure: Coronary CTO Intervention;  Surgeon: Victory LELON Sharps, MD;  Location: [facility] INVASIVE CV LAB;  Service: Cardiovascular;  Laterality: N/A;   EP IMPLANTABLE DEVICE  09/22/08   [employer], ICD Model Number:  D274DRG, ICD Serial Number: [id number]   FEMORAL ARTERY - FEMORAL ARTERY BYPASS GRAFT  march 2009   left to right bypass, first @ [facility], second at [facility] by Dr Eliza   ICD GENERATOR CHANGEOUT N/A 11/12/2016   Procedure:  ICD Generator Changeout;  Surgeon: Kelsie Agent, MD;  Location: [facility] INVASIVE CV LAB;  Service: Cardiovascular;  Laterality: N/A;   RIGHT HEART CATH N/A 03/18/2023   Procedure: RIGHT HEART CATH;  Surgeon: Rolan Ezra RAMAN, MD;  Location: [facility] INVASIVE CV LAB;  Service: Cardiovascular;  Laterality: N/A;   RIGHT/LEFT HEART CATH AND CORONARY ANGIOGRAPHY N/A 07/12/2016   Procedure: Right/Left Heart Cath and Coronary Angiography;  Surgeon: Ezra RAMAN Rolan, MD;  Location: [facility] INVASIVE CV LAB;  Service: Cardiovascular;  Laterality: N/A;   RIGHT/LEFT HEART CATH  AND CORONARY ANGIOGRAPHY N/A 01/28/2020   Procedure: RIGHT/LEFT HEART CATH AND CORONARY ANGIOGRAPHY;  Surgeon: Rolan Ezra RAMAN, MD;  Location: [facility] INVASIVE CV LAB;  Service: Cardiovascular;  Laterality: N/A;   TONSILLECTOMY     TRANSESOPHAGEAL ECHOCARDIOGRAM (CATH LAB) N/A 01/07/2024   Procedure: TRANSESOPHAGEAL ECHOCARDIOGRAM;  Surgeon: Rolan Ezra RAMAN, MD;  Location[facility] INVASIVE CV LAB;  Service: Cardiovascular;  Laterality: N/A;   VASECTOMY     VENTRICULAR ABLATION SURGERY  09/2008     Social History:   reports that he quit smoking about 16 years ago. His smoking use included cigarettes. He started smoking about 63 years ago. He has a 47 pack-year smoking history. He has never been exposed to tobacco smoke. He has never used smokeless tobacco. He reports current alcohol use. He reports that he does not use drugs.   Family History:  His family history includes Gastric cancer in his mother; Heart attack (age of onset: 64) in his brother; Peripheral vascular disease in his father; Throat cancer in his father.   Allergies Allergies  Allergen Reactions   Atorvastatin Other (See Comments)    Muscle pain   Crestor [Rosuvastatin Calcium ] Other (See Comments)    Muscle pain   Enalapril  Other (See Comments)    Upset stomach   Lisinopril Cough   Losartan  Other (See Comments)    Muscle pain    Telmisartan  Other (See Comments)    Stomach ache     Home Medications  Prior to Admission medications   Medication Sig Start Date End Date Taking? Authorizing Provider  acetaminophen  (TYLENOL ) 500 MG tablet Take 1,000 mg by mouth every 6 (six) hours as needed for moderate pain (pain score 4-6).   Yes [provider]  amiodarone  (PACERONE ) 200 MG tablet Take 0.5 tablets (100 mg total) by mouth daily. 11/17/23  Yes Rolan Ezra RAMAN, MD  carvedilol  (COREG ) 12.5 MG tablet Take 1 tablet by mouth twice daily 10/31/23  Yes [place], [place], FNP  chlorhexidine  (PERIDEX ) 0.12 % solution Use as  directed 15 mLs in the mouth or throat daily as needed (irritation).  02/02/15  Yes [provider]  cholecalciferol (VITAMIN D3) 25 MCG (1000 UNIT) tablet Take 1,000 Units by mouth daily.   Yes [provider]  dabigatran  (PRADAXA ) 75 MG CAPS capsule Take 1 capsule (75 mg total) by mouth 2 (two) times daily. 11/21/23  Yes Rolan Ezra RAMAN, MD  dapagliflozin  propanediol (FARXIGA ) 10 MG TABS tablet Take 1 tablet (10 mg total) by mouth daily before breakfast. 06/23/23  Yes Rolan Ezra RAMAN, MD  eplerenone  (INSPRA ) 25 MG tablet Take 1 tablet by mouth once daily 10/31/23  Yes [place], [place], FNP  fluticasone  (FLONASE ) 50 MCG/ACT nasal spray Place 1 spray into both nostrils daily as needed for rhinitis.   Yes [provider]  furosemide  (LASIX ) 40 MG tablet Take 1 tablet (40 mg total) by mouth 2 (two) times daily. 12/04/23  Yes Rolan,  Ezra RAMAN, MD  leflunomide  (ARAVA ) 20 MG tablet Take 20 mg by mouth daily.   Yes [provider]  levothyroxine  (SYNTHROID ) 50 MCG tablet Take 1 tablet (50 mcg total) by mouth daily before breakfast. 11/21/23  Yes Rolan Ezra RAMAN, MD  lidocaine  4 % Place 1 patch onto the skin daily as needed (Pain).   Yes [provider]  Melatonin 10 MG TABS Take 10 mg by mouth at bedtime as needed (sleep).    Yes [provider]  simvastatin  (ZOCOR ) 20 MG tablet TAKE 1 TABLET BY MOUTH ONCE DAILY AT BEDTIME 05/13/23  Yes Rolan Ezra RAMAN, MD     Critical care time:     This patient is critically ill with multiple organ system failure which requires frequent high complexity decision making, assessment, support, evaluation, and titration of therapies. This was completed through the application of advanced monitoring technologies and extensive interpretation of multiple databases. During this encounter critical care time was devoted to patient care services described in this note for 45 minutes.   Leita SHAUNNA Gaskins, DO 03/04/24 12:24  PM [facility]  For contact information, see Amion. If no response to pager, please call PCCM consult pager. After hours, 7PM- 7AM, please call Elink.

## 2024-03-06 NOTE — Anesthesia Postprocedure Evaluation (Signed)
 Anesthesia Post Note  Patient: Nathan Pruitt  Procedure(s) Performed: TRANSCATHETER MITRAL EDGE TO EDGE REPAIR TRANSESOPHAGEAL ECHOCARDIOGRAM RIGHT HEART CATH     Patient location during evaluation: SICU Anesthesia Type: General Level of consciousness: sedated Pain management: pain level controlled Vital Signs Assessment: post-procedure vital signs reviewed and stable Respiratory status: patient remains intubated per anesthesia plan Cardiovascular status: stable Postop Assessment: no apparent nausea or vomiting Anesthetic complications: no   No notable events documented.  Last Vitals:  Vitals:   2024/02/16 1612 16-Feb-2024 1613  BP:    Pulse:    Resp: (!) 0 (!) 0  Temp: (!) 36.4 C (!) 36.4 C  SpO2:      Last Pain:  Vitals:   02-16-24 0715  PainSc: 0-No pain                 Nathan Pruitt S

## 2024-03-06 NOTE — Anesthesia Procedure Notes (Signed)
 Procedure Name: Intubation Date/Time: 02-20-2024 8:42 AM  Performed by: Virgil Ee, CRNAPre-anesthesia Checklist: Patient identified, Patient being monitored, Timeout performed, Emergency Drugs available and Suction available Patient Re-evaluated:Patient Re-evaluated prior to induction Oxygen Delivery Method: Circle system utilized Preoxygenation: Pre-oxygenation with 100% oxygen Induction Type: IV induction Ventilation: Mask ventilation without difficulty Laryngoscope Size: Mac and 4 Grade View: Grade II Tube type: Oral Tube size: 7.0 mm Number of attempts: 1 Airway Equipment and Method: Stylet Placement Confirmation: ETT inserted through vocal cords under direct vision, positive ETCO2 and breath sounds checked- equal and bilateral Secured at: 22 cm Tube secured with: Tape Dental Injury: Teeth and Oropharynx as per pre-operative assessment

## 2024-03-06 NOTE — Interval H&P Note (Signed)
 History and Physical Interval Note:  21-Feb-2024 7:00 AM  Nathan Pruitt  has presented today for surgery, with the diagnosis of severe mitral insufficiency.  The various methods of treatment have been discussed with the patient and family. After consideration of risks, benefits and other options for treatment, the patient has consented to  Procedure(s): TRANSCATHETER MITRAL EDGE TO EDGE REPAIR (N/A) TRANSESOPHAGEAL ECHOCARDIOGRAM (N/A) as a surgical intervention.  The patient's history has been reviewed, patient examined, no change in status, stable for surgery.  I have reviewed the patient's chart and labs.  Questions were answered to the patient's satisfaction.     Tanetta Fuhriman K Kaylana Fenstermacher

## 2024-03-06 NOTE — Transfer of Care (Signed)
 Immediate Anesthesia Transfer of Care Note  Patient: Nathan Pruitt  Procedure(s) Performed: TRANSCATHETER MITRAL EDGE TO EDGE REPAIR TRANSESOPHAGEAL ECHOCARDIOGRAM RIGHT HEART CATH  Patient Location: ICU  Anesthesia Type:General  Level of Consciousness: Patient remains intubated per anesthesia plan  Airway & Oxygen Therapy: Patient remains intubated per anesthesia plan and Patient placed on Ventilator (see vital sign flow sheet for setting)  Post-op Assessment: Report given to RN and Post -op Vital signs reviewed and stable  Post vital signs: Reviewed and stable  Last Vitals:  Vitals Value Taken Time  BP 111/57 Mar 05, 2024 12:55  Temp 37 C 03/05/24 13:16  Pulse 62 03-05-24 13:16  Resp 18 2024/03/05 13:16  SpO2 100 % Mar 05, 2024 13:16  Vitals shown include unfiled device data.  Last Pain:  Vitals:   03-05-2024 0715  PainSc: 0-No pain      Patients Stated Pain Goal: 0 (03/05/24 0715)  Complications: No notable events documented.

## 2024-03-06 NOTE — Op Note (Signed)
 [facility]   MULTIDISCIPLINARY HEART TEAM  Date of Procedure:  01-Mar-2024  Preoperative Diagnosis: Severe Symptomatic Mitral Regurgitation (Stage D)  Postoperative Diagnosis: Severe MR, cardiogenic shock  Procedure Performed: Ultrasound-guided right  transfemoral venous access Double PreClose right femoral vein Transseptal puncture using Bailess RF needle Mitral valve repair with MitraClip NTW x 3  Surgeon: Lurena Red, MD   Echocardiographer: Santo  Anesthesiologist: Lucious  Device Implant: Mitraclip NTW x 3   Procedural Indication: Severe Non-rheumatic Mitral Regurgitation (Stage D)   Brief History: The patient is a 78 year old male with a history of coronary artery disease status post drug-eluting stent x 3 to the ramus and known CTO of the right coronary artery with collaterals, peripheral vascular disease status post redo left to right femorofemoral bypass, ischemic cardiomyopathy, VT status post ICD and CRT upgrade, atrial fibrillation and flutter status post ablation, CKD stage IV, and severe symptomatic mitral regurgitation was referred for elective transcatheter edge-to-edge mitral pair with the MitraClip system.  Echo Findings: Preop:  Severe LV systolic dysfunction Severe functional MR secondary to ischemic cardiomyopath, Grade 4+ Post-op:  Unchanged LV systolic function Severe residual MR  Procedural Details: Prep The patient is brought to the cardiac catheterization lab in the fasting state. General anesthesia is induced. The patient is prepped from the groin to chin. A foley catheter is placed. Hemodynamics are monitored via a radial artery line.   Venous Access Using ultrasound guidance, the right femoral vein is punctured. Ultrasound images are captured and stored in the patient's chart. The vein is dilated and 2 Perclose devices are deplyed at 10' and 2' positions to 'Preclose' the femoral vein. An 8 Fr sheath is  inserted.  Transseptal Puncture A Baylis Versacross wire is advanced into the SVC A Baylis transseptal dilator is advanced into the SVC, and the VersaCross RF wire is retracted into the dilator  The transseptal sheath is retracted into the RA under fluoroscopic and echo guidance to obtain position on the posterior fossa where echo measurements are made to assure appropriate access to the mitral valve. Once proper position is confirmed by echo, RF energy is delivered and the VersaCross wire is advanced into the LA without resistance. The dilator and sheath are advanced over the wire where proper position is confirmed by echo and pressure measurement Weight based IV heparin  is administered and a therapeutic ACT > 250 is confirmed  Steerable Guide Catheter Insertion The VersaCross wire is positioned at the left upper pulmonary vein The femoral vein is progressively dilated and the 24 Fr Steerable guide catheter is inserted and then directed across the interatrial septum over the wire. Position is confirmed approximately 3 cm into the left atrium The guide is de-aired   MitraClip Insertion The MitraClip NTW is prepped per protocol and inserted via the introducer into the steerable guide catheter The Clip Delivery System (CDS) is advanced under fluoro and echo guidance so that the sleeve markers are evenly spaced on each side of the guide marker  MitraClip Positioning in the Left Atrium (Supravalvular Alignment) M-knob is applied to bring the Clip towards the mitral valve. Echo guidance is used to avoid contact with LA structures. The Clip arms are opened to 180 degrees 2D and 3D TEE imaging is performed in multiple planes and the Clip is positioned and aligned above the valve using standard steering techniques   Entry into the Left Ventricle and Mitral Valve Leaflet Grasp The Clip is advanced across the mitral valve into the LV, maintaining  proper orientation The Clip arms are opened to 120  degrees and the Clip is slowly retracted  Capture of both the anterior and posterior leaflets are visualized by echo and the grippers are dropped  MitraClip Deployment After extensive echo evaluation, reduction in mitral regurgitation is felt to be adequate Following standard protocol, the lock line is removed after testing the lock mechanism. The lock is rechecked and is shown to be intact. The MitraClip device is deployed and the clip delivery system is removed under echo guidance with caution taken to avoid contact with LA structures  Placement of Clip #2 MitraClip Insertion The MitraClip NTW is prepped per protocol and inserted via the introducer into the steerable guide catheter The Clip Delivery System (CDS) is advanced under fluoro and echo guidance so that the sleeve markers are evenly spaced on each side of the guide marker  MitraClip Positioning in the Left Atrium (Supravalvular Alignment) M-knob is applied to bring the Clip towards the mitral valve. Echo guidance is used to avoid contact with LA structures. Low tidal volume respiration is initiated The Clip arms are opened to 180 degrees 2D and 3D TEE imaging is performed in multiple planes and the Clip is positioned and aligned above the valve using standard steering techniques   Entry into the Left Ventricle and Mitral Valve Leaflet Grasp The Clip is advanced across the mitral valve into the LV, maintaining proper orientation and with caution taken to avoid contact with the first Clip The Clip arms are opened further and the Clip is slowly retracted  Capture of both the anterior and posterior leaflets are visualized by echo and the grippers are dropped  MitraClip Deployment After extensive echo evaluation, reduction in mitral regurgitation is felt to be adequate Following standard protocol, the MitraClip device is deployed, gripper line and lock line are removed  Device Removal The clip delivery system is removed under echo  guidance The steerable guide catheter is retracted into the right atrium and the interatrial septum is assessed by echo without evidence of right-to-left shunting or large ASD   Placement of Clip #3 MitraClip Insertion The MitraClip NTW is prepped per protocol and inserted via the introducer into the steerable guide catheter The Clip Delivery System (CDS) is advanced under fluoro and echo guidance so that the sleeve markers are evenly spaced on each side of the guide marker  MitraClip Positioning in the Left Atrium (Supravalvular Alignment) M-knob is applied to bring the Clip towards the mitral valve. Echo guidance is used to avoid contact with LA structures. Low tidal volume respiration is initiated The Clip arms are opened to 180 degrees 2D and 3D TEE imaging is performed in multiple planes and the Clip is positioned and aligned above the valve using standard steering techniques   Entry into the Left Ventricle and Mitral Valve Leaflet Grasp The Clip is advanced across the mitral valve into the LV, maintaining proper orientation and with caution taken to avoid contact with the first Clip The Clip arms are opened further and the Clip is slowly retracted  Capture of both the anterior and posterior leaflets are visualized by echo and the grippers are dropped  MitraClip Deployment After extensive echo evaluation, reduction in mitral regurgitation is felt to be adequate Following standard protocol, the MitraClip device is deployed, gripper line and lock line are removed  Device Removal The clip delivery system is removed under echo guidance The steerable guide catheter is retracted into the right atrium and the interatrial septum is assessed  by echo without evidence of right-to-left shunting or large ASD  First NTW device was placed at the lateral aspect of A2 P2 with a reduction of mitral regurgitation to moderate to severe from severe.  We then placed another NTW clip near the medial aspect  of A2 P2 with reduction in mitral regurgitation to moderate.  We had normalization of pulmonary vein flow.  Due to residual mitral regurgitation at the more medial aspect we elected to place an NTW clip to treat this residual mitral regurgitation.  During clip deployment we became entangled and the patient developed iatrogenic severe mitral regurgitation due to likely chordal rupture.  The residual mitral regurgitation seem to emanate from near the commissure.  We did not think we could place another device here.  There was some residual mitral regurgitation between the 2nd and 3rd clips however we did not think there was enough room to place another device.  After a multidisciplinary review with advanced heart failure, we elected to pursue medical therapy only.  The patient will be transferred to 2 heart unit.   Estimated blood loss: minimal    Niv Darley K Farrin Shadle 02/27/24 12:50 PM

## 2024-03-06 NOTE — Progress Notes (Signed)
 Patient was compassionately extubated to room air per MD order at 15:39. Patient's family member, Dr. Zenaida, RN & RT were all present during extubation.

## 2024-03-06 NOTE — Telephone Encounter (Signed)
 Advanced Heart Failure Patient Advocate Encounter  Received notification from [employer] that patient has been conditionally approved for 2026 but needs to submit updated authorization form. Attempted to contact patient by phone, no answer. Left voicemail.  Rachel DEL, CPhT Rx Patient Advocate Phone: [phone]

## 2024-03-06 NOTE — Anesthesia Procedure Notes (Signed)
 Arterial Line Insertion Start/EndOct 25, 2025 7:45 AM, February 28, 2024 7:48 AM Performed by: Lucious Debby BRAVO, MD, anesthesiologist  Patient location: Pre-op. Preanesthetic checklist: patient identified, IV checked, risks and benefits discussed, surgical consent, monitors and equipment checked, pre-op evaluation, timeout performed and anesthesia consent Lidocaine  1% used for infiltration and patient sedated Left, radial was placed Catheter size: 20 G Hand hygiene performed   Attempts: 2 (Previous attempt by CRNA on left radial unsuccessful) Procedure performed using ultrasound to evaluate access site. Ultrasound Notes:relevant anatomy identified, ultrasound used to visualize needle entry, vessel patent under ultrasound and image(s) printed for medical record. Following insertion, dressing applied and Biopatch. Post procedure assessment: unchanged and normal  Patient tolerated the procedure well with no immediate complications.

## 2024-03-06 NOTE — Death Summary Note (Cosign Needed Addendum)
 HEART AND VASCULAR CENTER   MULTIDISCIPLINARY HEART VALVE TEAM  Death Summary    Patient ID: THEO KRUMHOLZ MRN: [id number]; DOB: 1945/10/28  Admit date: 02/23/2024 Discharge date: Feb 23, 2024  PCP:  Ina Marcellus RAMAN, MD  [facility] Cardiologist:  [name], MD  [facility] Structural heart: Lurena MARLA Red, MD [facility] Electrophysiologist:  Will Gladis Norton, MD   Discharge Diagnoses    Principal Problem:   Severe mitral regurgitation Active Problems:   HYPERTENSION, BENIGN ESSENTIAL   CAD S/P percutaneous coronary angioplasty   Cardiomyopathy, ischemic   PAD (peripheral artery disease)   Chronic systolic CHF (congestive heart failure) (HCC)   V tach (HCC)   S/P mitral valve repair   Allergies Allergies  Allergen Reactions   Atorvastatin Other (See Comments)    Muscle pain   Crestor [Rosuvastatin Calcium ] Other (See Comments)    Muscle pain   Enalapril  Other (See Comments)    Upset stomach   Lisinopril Cough   Losartan  Other (See Comments)    Muscle pain    Telmisartan  Other (See Comments)    Stomach ache    Diagnostic Studies/Procedures      HEART AND VASCULAR CENTER   MULTIDISCIPLINARY HEART TEAM   Date of Procedure:                02/23/24   Preoperative Diagnosis:Severe Symptomatic Mitral Regurgitation (Stage D)   Postoperative Diagnosis:    Severe MR, cardiogenic shock   Procedure Performed: Ultrasound-guided right  transfemoral venous access Double PreClose right femoral vein Transseptal puncture using Bailess RF needle Mitral valve repair with MitraClip NTW x 3   Surgeon: Lurena Red, MD    Echocardiographer: Santo   Anesthesiologist: Lucious   Device Implant: Mitraclip NTW x 3    Procedural Indication: Severe Non-rheumatic Mitral Regurgitation (Stage D)    Brief History: The patient is a 78 year old male with a history of coronary artery disease status post drug-eluting stent x 3 to the ramus and known  CTO of the right coronary artery with collaterals, peripheral vascular disease status post redo left to right femorofemoral bypass, ischemic cardiomyopathy, VT status post ICD and CRT upgrade, atrial fibrillation and flutter status post ablation, CKD stage IV, and severe symptomatic mitral regurgitation was referred for elective transcatheter edge-to-edge mitral pair with the MitraClip system.   Echo Findings: Preop:  Severe LV systolic dysfunction Severe functional MR secondary to ischemic cardiomyopath, Grade 4+ Post-op:  Unchanged LV systolic function Severe residual MR     History of Present Illness     FALLON HOWERTER is a 78 y.o. male with a history of coronary artery disease status post drug-eluting stent x 3 to the ramus and known CTO of the right coronary artery with collaterals, peripheral vascular disease status post redo left to right femorofemoral bypass, ischemic cardiomyopathy, VT status post ICD and CRT upgrade, atrial fibrillation and flutter status post ablation, CKD stage IV, and severe symptomatic mitral regurgitation who presented to [facility] on 2024/02/23 for planned mTEER.  TEE 01/2024 with EF 25-30%, basal inferolateral/inferior aneurysm, moderate LV enlargement, mild LVH, moderate RV dysfunction, severe infarct-related MR. He developed severe ischemic/infarction mitral regurgitation and NYHA class II symptoms of shortness of breath and felt to be a good candidate for mitral transcatheter edge-to-edge repair.  Hospital Course     Consultants: CHF, PCCM   He underwent mitral valve repair with Mitraclip NTW x 3  Unfortunately, placement of the clip was complicated by cord rupture with resultant severe  MR. He was admitted from cath lab intubated/hypotensive and started on Norepi. He developed increasing pressor requirements and cardiogenic shock. Patient was compassionately extubated to room air per MD order at 15:39 and he peacefully passed. Famiy was at bedside.   _____________  Discharge Vitals Blood pressure (!) 105/57, pulse 70, temperature 98.2 F (36.8 C), resp. rate (!) 27, height 5' 10 (1.778 m), weight 56.7 kg, SpO2 99%.  Filed Weights   February 23, 2024 0641  Weight: 56.7 kg    Duration of Encounter: APP Time: 15 minutes    Signed, Lamarr Hummer, PA-C 23-Feb-2024, 5:07 PM [phone]  ATTENDING ATTESTATION:  Patient is 78 year old male with multiple comorbidities and not candidate for advanced heart failure therapies.  He is referred for mitral transcatheter edge-to-edge repair.  3 NT W devices were placed with the third 1 causing chordal rupture.  This led to severe mitral regurgitation.  We did not think there were good options for percutaneous repair as the defect was near the medial commissure.  After review with heart failure attending no further interventions were pursued.  The patient was transferred to the to heart unit.  The patient developed worsening cardiogenic shock.  After meeting with the family at bedside, the patient was transition to comfort measures and expired.  Lurena Red, MD Pager [phone]

## 2024-03-06 NOTE — Consult Note (Addendum)
 Advanced Heart Failure Team Consult Note   Primary Physician: Ina Marcellus RAMAN, MD Cardiologist:  [name], MD  Reason for Consultation: A/C HFrEF  HPI:    Nathan Pruitt is seen today for evaluation of A/C HFrEF at the request of Dr Wendel.   78 year old with a history of CAD, RA, CKD stage III, PAD, VT, CRT-D, chronic HFrEF, PAF, and severe MR.   TEE 01/2024 with EF 25-30%, basal inferolateral/inferior aneurysm, moderate LV enlargement, mild LVH, moderate RV dysfunction, severe infarct-related MR.    Followed in the HF clinic by Dr Shuck. Referred to structural team for mitral clip. Prior to admit short of breath with minimal exertion.   Presented to today for scheduled mitral clip. S/P Mitral 2 clips placed and 3rd clip torn cord.  Swan left in place. During transport to 2H developed hypotension. Started on norepi with escalation.     Home Medications Prior to Admission medications   Medication Sig Start Date End Date Taking? Authorizing Provider  acetaminophen  (TYLENOL ) 500 MG tablet Take 1,000 mg by mouth every 6 (six) hours as needed for moderate pain (pain score 4-6).   Yes [provider]  amiodarone  (PACERONE ) 200 MG tablet Take 0.5 tablets (100 mg total) by mouth daily. 11/17/23  Yes Shuck Ezra RAMAN, MD  carvedilol  (COREG ) 12.5 MG tablet Take 1 tablet by mouth twice daily 10/31/23  Yes [place], Harlene CHRISTELLA, FNP  chlorhexidine  (PERIDEX ) 0.12 % solution Use as directed 15 mLs in the mouth or throat daily as needed (irritation).  02/02/15  Yes [provider]  cholecalciferol (VITAMIN D3) 25 MCG (1000 UNIT) tablet Take 1,000 Units by mouth daily.   Yes [provider]  dabigatran  (PRADAXA ) 75 MG CAPS capsule Take 1 capsule (75 mg total) by mouth 2 (two) times daily. 11/21/23  Yes Shuck Ezra RAMAN, MD  dapagliflozin  propanediol (FARXIGA ) 10 MG TABS tablet Take 1 tablet (10 mg total) by mouth daily before breakfast. 06/23/23  Yes Shuck Ezra RAMAN, MD  eplerenone  (INSPRA ) 25 MG tablet Take 1 tablet by mouth once daily 10/31/23  Yes [place]ford, Harlene CHRISTELLA, FNP  fluticasone  (FLONASE ) 50 MCG/ACT nasal spray Place 1 spray into both nostrils daily as needed for rhinitis.   Yes [provider]  furosemide  (LASIX ) 40 MG tablet Take 1 tablet (40 mg total) by mouth 2 (two) times daily. 12/04/23  Yes Shuck Ezra RAMAN, MD  leflunomide  (ARAVA ) 20 MG tablet Take 20 mg by mouth daily.   Yes [provider]  levothyroxine  (SYNTHROID ) 50 MCG tablet Take 1 tablet (50 mcg total) by mouth daily before breakfast. 11/21/23  Yes Shuck Ezra RAMAN, MD  lidocaine  4 % Place 1 patch onto the skin daily as needed (Pain).   Yes [provider]  Melatonin 10 MG TABS Take 10 mg by mouth at bedtime as needed (sleep).    Yes [provider]  simvastatin  (ZOCOR ) 20 MG tablet TAKE 1 TABLET BY MOUTH ONCE DAILY AT BEDTIME 05/13/23  Yes Shuck Ezra RAMAN, MD    Past Medical History: Past Medical History:  Diagnosis Date   Abnormal PFTs 11/2009   FVC 74%, FEV1 80%, ratio 75%, TLC 78%, DLCO 68%. this suggests a mild restrictive and obstructive defect. pt did have a response to bronchodilator. these PFTs were significantly better than the report from Dr. Mardee in [place] done prior.    AICD (automatic cardioverter/defibrillator) present 2010   Anxiety    Atrial flutter (HCC)  s/p isthmus ablation 5/10   CAD (coronary artery disease)    hx of silent MI in 1993. likely an inferior MI. hx of 2D cardiogram in 3/09 showing EF of 40%. hx of myoview in [place] 3/09-nml. presented to [facility] 5/10 with VT and mildly elevated cardiac enzymes LHC (5/10): inferobasal dyskinesis with EF 35-40%. was chronic total occlusion of mid RCA with good collaterals. luminals LCA. this does not appear to be an acute cause of the 5/10 event   Cancer (HCC)    skin   Cervical radiculopathy    CHF (congestive heart failure) (HCC)    Chronic lower back pain    CKD (chronic kidney  disease)    HLD (hyperlipidemia)    HTN (hypertension)    Ischemic cardiomyopathy    EF 35-40% by LV-gram 5/10 with inferobasal dyskinesis. echo 5/10 showed EF 40% w/mild LVH, no sig. MR, inferobasal and posterobasal akinesis. echo (7/11): EF 50%, mild LVH, basal-mid inferoposterior akenesis   Migraine    visual migraine; maybe 12/year (07/16/2016)   PAD (peripheral artery disease)    s/p L-to-R fem-fem bypass performed by Dr. Eliza at [facility] in 2009    Rheumatoid arthritis [facility])    on leflunomide    Silent myocardial infarction [facility]) 1993   silent   Tobacco abuse    47 pack year hx; quit 8/09   Ventricular tachycardia (HCC)    likely scar-mediated. VT storm 5/10 suppressed by amiodarone  and Coreg . He has duel chamber Medtronic ICD    Past Surgical History: Past Surgical History:  Procedure Laterality Date   BIV UPGRADE N/A 10/05/2021   Procedure: BIVI UPGRADE;  Surgeon: Inocencio Soyla Lunger, MD;  Location: [facility] INVASIVE CV LAB;  Service: Cardiovascular;  Laterality: N/A;   CARDIAC DEFIBRILLATOR PLACEMENT  09/22/2008   CARDIOVERSION N/A 11/25/2023   Procedure: CARDIOVERSION;  Surgeon: Rolan Ezra RAMAN, MD;  Location: [facility] INVASIVE CV LAB;  Service: Cardiovascular;  Laterality: N/A;   CATARACT EXTRACTION W/ INTRAOCULAR LENS  IMPLANT, BILATERAL Bilateral 05/2016 - 06/2016   CORONARY BALLOON ANGIOPLASTY N/A 01/28/2020   Procedure: CORONARY BALLOON ANGIOPLASTY;  Surgeon: Dann Candyce RAMAN, MD;  Location: [facility] INVASIVE CV LAB;  Service: Cardiovascular;  Laterality: N/A;   CORONARY CTO INTERVENTION  07/16/2016   CORONARY CTO INTERVENTION N/A 07/16/2016   Procedure: Coronary CTO Intervention;  Surgeon: Victory LELON Sharps, MD;  Location: [facility] INVASIVE CV LAB;  Service: Cardiovascular;  Laterality: N/A;   EP IMPLANTABLE DEVICE  09/22/08   Medtronic, ICD Model Number:  [id number], ICD Serial Number: [id number]   FEMORAL ARTERY - FEMORAL ARTERY BYPASS GRAFT  march 2009   left to right bypass, first @  [facility], second at [facility] by Dr Eliza   ICD GENERATOR CHANGEOUT N/A 11/12/2016   Procedure: ICD Generator Changeout;  Surgeon: Kelsie Agent, MD;  Location: [facility] INVASIVE CV LAB;  Service: Cardiovascular;  Laterality: N/A;   RIGHT HEART CATH N/A 03/18/2023   Procedure: RIGHT HEART CATH;  Surgeon: Rolan Ezra RAMAN, MD;  Location: [facility] INVASIVE CV LAB;  Service: Cardiovascular;  Laterality: N/A;   RIGHT/LEFT HEART CATH AND CORONARY ANGIOGRAPHY N/A 07/12/2016   Procedure: Right/Left Heart Cath and Coronary Angiography;  Surgeon: Ezra RAMAN Rolan, MD;  Location: [facility] INVASIVE CV LAB;  Service: Cardiovascular;  Laterality: N/A;   RIGHT/LEFT HEART CATH AND CORONARY ANGIOGRAPHY N/A 01/28/2020   Procedure: RIGHT/LEFT HEART CATH AND CORONARY ANGIOGRAPHY;  Surgeon: Rolan Ezra RAMAN, MD;  Location: [facility] INVASIVE CV LAB;  Service: Cardiovascular;  Laterality:  N/A;   TONSILLECTOMY     TRANSESOPHAGEAL ECHOCARDIOGRAM (CATH LAB) N/A 01/07/2024   Procedure: TRANSESOPHAGEAL ECHOCARDIOGRAM;  Surgeon: Rolan Ezra RAMAN, MD;  Location: [facility] INVASIVE CV LAB;  Service: Cardiovascular;  Laterality: N/A;   VASECTOMY     VENTRICULAR ABLATION SURGERY  09/2008    Family History: Family History  Problem Relation Age of Onset   Gastric cancer Mother    Throat cancer Father    Peripheral vascular disease Father    Heart attack Brother 45    Social History: Social History   Socioeconomic History   Marital status: Single    Spouse name: Not on file   Number of children: 0   Years of education: 12   Highest education level: Some college, no degree  Occupational History   Not on file  Tobacco Use   Smoking status: Former    Current packs/day: 0.00    Average packs/day: 1 pack/day for 47.0 years (47.0 ttl pk-yrs)    Types: Cigarettes    Start date: 68    Quit date: 2009    Years since quitting: 16.7    Passive exposure: Never   Smokeless tobacco: Never   Tobacco comments:    Former smoker  10/02/23  Vaping Use   Vaping status: Never Used  Substance and Sexual Activity   Alcohol use: Yes    Comment: 07/15/2016 nothing for the last couple years 04/18/21 very seldom   Drug use: No    Comment: prior    Sexual activity: Not on file  Other Topics Concern   Not on file  Social History Narrative   Single; gets minimal exercise; 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 originally from [place]   Caffeine 3/4 c daily   Social Drivers of Corporate investment banker Strain: Low Risk  (07/19/2021)   Overall Financial Resource Strain (CARDIA)    Difficulty of Paying Living Expenses: Not very hard  Food Insecurity: No Food Insecurity (07/19/2021)   Hunger Vital Sign    Worried About Running Out of Food in the Last Year: Never true    Ran Out of Food in the Last Year: Never true  Transportation Needs: No Transportation Needs (07/19/2021)   PRAPARE - Administrator, Civil Service (Medical): No    Lack of Transportation (Non-Medical): No  Physical Activity: Not on file  Stress: Not on file  Social Connections: Not on file    Allergies:  Allergies  Allergen Reactions   Atorvastatin Other (See Comments)    Muscle pain   Crestor [Rosuvastatin Calcium ] Other (See Comments)    Muscle pain   Enalapril  Other (See Comments)    Upset stomach   Lisinopril Cough   Losartan  Other (See Comments)    Muscle pain    Telmisartan  Other (See Comments)    Stomach ache    Objective:    Vital Signs:   Temp:  [97.5 F (36.4 C)] 97.5 F (36.4 C) March 09, 2024 0641) Pulse Rate:  [60-64] 64 09-Mar-2024 1255) Resp:  [18-19] 19 2024/03/09 1255) BP: (109-111)/(57-64) 111/57 2024/03/09 1255) SpO2:  [99 %-100 %] 100 % 2024-03-09 1255) FiO2 (%):  [40 %-50 %] 40 % 2024/03/09 1307) Weight:  [56.7 kg] 56.7 kg 03-09-2024 0641)    Weight change: Filed Weights   2024/03/09 0641  Weight: 56.7 kg    Intake/Output:   Intake/Output Summary (Last 24 hours) at 03-09-24 1337 Last data filed at 03/09/2024 1235 Gross per 24 hour   Intake 603.33 ml  Output --  Net 603.33 ml      Physical Exam    General:   Intubated/Thin/ Frail  Neck: no JVD.  Cor: Regular rate & rhythm.  Lungs: Crackles.  Abdomen: soft, nontenderr, nondistended.  Extremities:RLE/LLE mottled Neuro:Intubated.    Telemetry  SR 60s    EKG    N/A  Labs   Basic Metabolic Panel: Recent Labs  Lab 03-04-2024 1258  NA 134*  K 5.0    Liver Function Tests: No results for input(s): AST, ALT, ALKPHOS, BILITOT, PROT, ALBUMIN in the last 168 hours. No results for input(s): LIPASE, AMYLASE in the last 168 hours. No results for input(s): AMMONIA in the last 168 hours.  CBC: Recent Labs  Lab 03/04/24 1258  HGB 10.5*  HCT 31.0*    Cardiac Enzymes: No results for input(s): CKTOTAL, CKMB, CKMBINDEX, TROPONINI in the last 168 hours.  BNP: BNP (last 3 results) Recent Labs    02/25/23 1153 01/30/24 1107  BNP 1,104.4* 1,241.9*    ProBNP (last 3 results) No results for input(s): PROBNP in the last 8760 hours.   CBG: No results for input(s): GLUCAP in the last 168 hours.  Coagulation Studies: No results for input(s): LABPROT, INR in the last 72 hours.   Imaging   EP STUDY Result Date: 04-Mar-2024 See surgical note for result.  Structural Heart Procedure Result Date: 03-04-24 See surgical note for result.  CARDIAC CATHETERIZATION Result Date: 03/04/2024 See surgical note for result.  DG Chest 2 View Result Date: 03/04/24 EXAM: 2 VIEW(S) XRAY OF THE CHEST 03-04-2024 07:02:02 AM COMPARISON: Chest CT without contrast 07/07/2023. Earlier chest radiographs. CLINICAL HISTORY: 78 year old male. Pre-cardiac procedure. History of nonrheumatic mitral valve regurgitation and chronic systolic CHF. No chest complaints. FINDINGS: LINES, TUBES AND DEVICES: Stable left chest AICD. Contralateral right chest generator device with electrical lead coursing subcutaneously was present on the prior CT. LUNGS  AND PLEURA: No focal pulmonary opacity. No pulmonary edema. No pleural effusion. No pneumothorax. HEART AND MEDIASTINUM: Stable mild cardiomegaly. BONES AND SOFT TISSUES: No acute osseous abnormality. IMPRESSION: 1. Stable mild cardiomegaly. 2. Stable left chest AICD and contralateral right chest generator device with lead in expected position. Electronically signed by: Helayne Hurst MD Mar 04, 2024 07:27 AM EDT RP Workstation: [id number]     Medications:     Current Medications:  arformoterol  15 mcg Nebulization BID   docusate  100 mg Per Tube BID   famotidine  20 mg Per Tube BID   fentaNYL  (SUBLIMAZE ) injection  25-50 mcg Intravenous Once   heparin  injection (subcutaneous)  5,000 Units Subcutaneous Q8H   mouth rinse  15 mL Mouth Rinse Q2H   pantoprazole  (PROTONIX ) IV  40 mg Intravenous Daily   polyethylene glycol  17 g Per Tube Daily   revefenacin  175 mcg Nebulization Daily    Infusions:  fentaNYL  infusion INTRAVENOUS     norepinephrine (LEVOPHED) Adult infusion     propofol  (DIPRIVAN ) infusion        Patient Profile   78 year old with a history of CAD, RA, CKD stage iii. PAD, VT, CRT-D, chronic HFrEF, PAF, and severe MR.   Assessment/Plan   Severe MR ICM, S/P Mitral Clip, Cardiogenic Shock Mitral clip complicated by cord rupture. Admitted from cath lab intubated/hypotensive. Continue Norepi with titration  for MAP 65 or greater.  Continue swan for now.  Check lactic acid  A/C HFrEF, ICM, CRT-D Echo EF 20-25%.  As above managing with norepi.   PAF SR.  Add heparin  drip  GOC  Dr  Rolan discussed current condition with his niece  and that he is not a candidate for advanced therapies may not survive.  Will consult Palliative Care for GOC.   DNR   Check CBC, BMET, lactic acid. CCM consulted.     Addendum.- After discussion with his niece, she is wishes to pursue comfort care. Deactivate CRT-D.  Dr Zenaida at bedside.   Give versed  bolus followed by drip.    Length of Stay: 0  Greig Mosses, NP  Mar 04, 2024, 1:37 PM    Advanced Heart Failure Team Pager [phone] (M-F; 7a - 5p)  Please contact [facility] for night-coverage after hours (4p -7a ) and weekends on [email or web address]

## 2024-03-06 NOTE — Progress Notes (Addendum)
 PHARMACY - ANTICOAGULATION CONSULT NOTE  Pharmacy Consult for heparin  Indication: atrial fibrillation  Allergies  Allergen Reactions   Atorvastatin Other (See Comments)    Muscle pain   Crestor [Rosuvastatin Calcium ] Other (See Comments)    Muscle pain   Enalapril  Other (See Comments)    Upset stomach   Lisinopril Cough   Losartan  Other (See Comments)    Muscle pain    Telmisartan  Other (See Comments)    Stomach ache    Patient Measurements: Height: 5' 10 (177.8 cm) Weight: 56.7 kg (125 lb) IBW/kg (Calculated) : 73 HEPARIN  DW (KG): 56.7  Vital Signs: Temp: 98.2 F (36.8 C) 02-20-2024 1415) BP: 99/72 02-20-24 1415) Pulse Rate: 81 Feb 20, 2024 1415)  Labs: Recent Labs    Feb 20, 2024 1258  HGB 10.5*  HCT 31.0*    Estimated Creatinine Clearance: 22.2 mL/min (A) (by C-G formula based on SCr of 2.16 mg/dL (H)).   Medical History: Past Medical History:  Diagnosis Date   Abnormal PFTs 11/2009   FVC 74%, FEV1 80%, ratio 75%, TLC 78%, DLCO 68%. this suggests a mild restrictive and obstructive defect. pt did have a response to bronchodilator. these PFTs were significantly better than the report from Dr. Mardee in [place] done prior.    AICD (automatic cardioverter/defibrillator) present 2010   Anxiety    Atrial flutter (HCC)    s/p isthmus ablation 5/10   CAD (coronary artery disease)    hx of silent MI in 1993. likely an inferior MI. hx of 2D cardiogram in 3/09 showing EF of 40%. hx of myoview in [place] 3/09-nml. presented to [facility] 5/10 with VT and mildly elevated cardiac enzymes LHC (5/10): inferobasal dyskinesis with EF 35-40%. was chronic total occlusion of mid RCA with good collaterals. luminals LCA. this does not appear to be an acute cause of the 5/10 event   Cancer (HCC)    skin   Cervical radiculopathy    CHF (congestive heart failure) (HCC)    Chronic lower back pain    CKD (chronic kidney disease)    HLD (hyperlipidemia)    HTN (hypertension)    Ischemic cardiomyopathy     EF 35-40% by LV-gram 5/10 with inferobasal dyskinesis. echo 5/10 showed EF 40% w/mild LVH, no sig. MR, inferobasal and posterobasal akinesis. echo (7/11): EF 50%, mild LVH, basal-mid inferoposterior akenesis   Migraine    visual migraine; maybe 12/year (07/16/2016)   PAD (peripheral artery disease)    s/p L-to-R fem-fem bypass performed by Dr. Eliza at [facility] in 2009    Rheumatoid arthritis [facility])    on leflunomide    S/P mitral valve repair 02/20/2024   s/p Mitraclip NTW x 3  with severe residual mitral regugitation.   Silent myocardial infarction [facility]) 1993   silent   Tobacco abuse    47 pack year hx; quit 8/09   Ventricular tachycardia (HCC)    likely scar-mediated. VT storm 5/10 suppressed by amiodarone  and Coreg . He has duel chamber Medtronic ICD      Assessment: 78 yoM on Pradaxa  PTA for hx AFib admitted s/p mitraclip. DOAC held, pharmacy to use IV heparin  for now. Given Pradaxa  use will not need to use aPTTs.  Goal of Therapy:  Heparin  level 0.3-0.7 units/ml Monitor platelets by anticoagulation protocol: Yes   Plan:  Heparin  600 units/h no bolus Check heparin  level in 8h  Ozell Jamaica, PharmD, [place], [facility] Clinical Pharmacist [phone] Please check AMION for all [facility] Pharmacy numbers February 20, 2024

## 2024-03-06 NOTE — Op Note (Signed)
 PROCEDURE:  Transcatheter edge to edge mitral valve repair (TEER) INDICATION: Severe symptomatic mitral regurgitation (Stage D)  SURGEON:  Lurena Red, MD  CO-SURGEON: Ozell Fell, MD   PROCEDURAL DETAILS: General anesthesia is induced.  The patient is prepped and draped.  Baseline transesophageal echo images are obtained and confirm appropriate anatomy for transcatheter edge-to-edge mitral valve repair.  Using vascular ultrasound guidance, the right common femoral vein is accessed via a front wall puncture.  2 Perclose sutures are deployed and an 8 [place] sheath is inserted.  A versa cross wire is advanced into the SVC.  Heparin  is administered and a therapeutic ACT is achieved.  Transseptal puncture is performed over the mid posterior portion of the fossa.  The iliac veins are tortuous, so a 26 French dry seal sheath is inserted.  The transseptal dilator is advanced through the sheath.  The septum is dilated while the MitraClip steerable guide catheter is prepped.  After progressively dilating the right common femoral vein, the 24 French MitraClip steerable guide catheter is inserted and advanced across the interatrial septum.  The dilator and the versa cross wire were carefully removed and the guide tip is appropriately positioned approximately 3 cm across the interatrial septum.  A MitraClip NTW device is prepped per protocol.  The device is inserted through the steerable guide catheter with caution taken to avoid air entrapment.  The MitraClip device is positioned above the mitral valve after applying appropriate curve to the guide catheter.  The MitraClip device is then oriented coaxially with the anterior and posterior leaflets of the mitral valve.  Low tidal volume ventilation is initiated.  The clip device is advanced across the mitral valve and pulled back until capture of both the anterior and posterior leaflets is achieved.  Grippers are dropped and the clip is closed under TEE guidance.   Careful TEE assessment is performed and reduction in mitral valve regurgitation is felt to be appropriate.  Leaflet insertion is verified using 3D imaging.  The MitraClip device is deployed using normal technique and the clip delivery system is removed.  The first clip is intentionally deployed over the lateral aspect of the valve in order to reduce the annular size of the mitral valve.  There is modest reduction in mitral regurgitation after deployment of the first clip.  We decided to place a second clip just medial to the first clip.  Another NT W device is prepped per protocol.  The device is inserted through the steerable guide catheter and positioned above the mitral valve, applying the same maneuvers as the initial clip.  Caution is utilized to avoid interaction with the first clip.  Once the clip arm orientation is confirmed to be appropriate, the clip is closed advanced across the mitral valve where it is reopened and pulled back to confirm good leaflet capture on both clip arms.  The grippers are dropped and the clip is closed, further reducing mitral regurgitation to moderate.  We felt this was an adequate result and clearly improved the degree of mitral regurgitation.  The clip is released using standard maneuvers.  We reinterrogated the valve and felt that a third clip should be deployed to further reduce mitral regurgitation with an aim towards reducing it to the mild range.  Third NT W device is prepped and advanced through the steerable guide catheter, maneuvered down to the valve, aligned above the valve, and advanced in close position across the mitral valve.  This clip is deployed medially and the  clip did interact with cords below the valve.  The clip was pulled back up to the left atrium and the mitral regurgitation is demonstrated to be worse on the medial side of the valve.  We change the orientation and readvanced the clip, finally deploying it medial to the second clip.  The clip is  released after confirming leaflet insertion in both the anterior and posterior arms.  The patient is left with severe mitral regurgitation, suspected due to chordal injury.  After very extensive assessment, we did not feel there was room on either the medial side or in between the final clip and the second clip for another device.  A Swan-Ganz catheter is placed by Dr. Zaccone in the right internal jugular vein for invasive hemodynamic monitoring.  The advanced heart failure team was consulted and they came to the room to assess the patient.  The patient will be transferred to the CV-ICU for further care.  PROCEDURE COMPLETION: The steerable guide catheter is pulled back into the right atrium and the interatrial septum is assessed with TEE.  There is no significant septal injury seen and no right to left shunting.  The guide catheter is removed and the Perclose sutures are tightened.  Protamine is administered.  CONCLUSION: Transcatheter edge-to-edge mitral valve repair under fluoroscopic and echo guidance,  baseline 4+ mitral regurgitation, post-procedure 4+ mitral regurgitation.  Nathan Pruitt 2024-02-23 4:12 PM

## 2024-03-06 DEATH — deceased

## 2024-03-08 ENCOUNTER — Encounter (HOSPITAL_COMMUNITY): Admitting: Cardiology

## 2024-04-07 ENCOUNTER — Other Ambulatory Visit (HOSPITAL_COMMUNITY): Payer: Self-pay | Admitting: Cardiology

## 2024-04-08 ENCOUNTER — Ambulatory Visit: Payer: Medicare Other
